# Patient Record
Sex: Female | Born: 1940 | Race: White | Hispanic: No | Marital: Single | Smoking: Never smoker
Health system: Southern US, Community
[De-identification: ages and names within clinical notes are randomized; demographics above are authoritative.]

## PROBLEM LIST (undated history)

## (undated) DIAGNOSIS — I441 Atrioventricular block, second degree: Secondary | ICD-10-CM

## (undated) DIAGNOSIS — L900 Lichen sclerosus et atrophicus: Secondary | ICD-10-CM

## (undated) DIAGNOSIS — I2510 Atherosclerotic heart disease of native coronary artery without angina pectoris: Secondary | ICD-10-CM

## (undated) DIAGNOSIS — G4730 Sleep apnea, unspecified: Secondary | ICD-10-CM

## (undated) DIAGNOSIS — M25571 Pain in right ankle and joints of right foot: Secondary | ICD-10-CM

## (undated) DIAGNOSIS — R519 Headache, unspecified: Secondary | ICD-10-CM

## (undated) DIAGNOSIS — Z950 Presence of cardiac pacemaker: Secondary | ICD-10-CM

## (undated) DIAGNOSIS — I5189 Other ill-defined heart diseases: Secondary | ICD-10-CM

## (undated) DIAGNOSIS — R0600 Dyspnea, unspecified: Secondary | ICD-10-CM

## (undated) DIAGNOSIS — M25541 Pain in joints of right hand: Secondary | ICD-10-CM

## (undated) DIAGNOSIS — E785 Hyperlipidemia, unspecified: Secondary | ICD-10-CM

## (undated) DIAGNOSIS — J189 Pneumonia, unspecified organism: Secondary | ICD-10-CM

## (undated) DIAGNOSIS — K219 Gastro-esophageal reflux disease without esophagitis: Secondary | ICD-10-CM

## (undated) DIAGNOSIS — I10 Essential (primary) hypertension: Secondary | ICD-10-CM

## (undated) DIAGNOSIS — L4050 Arthropathic psoriasis, unspecified: Secondary | ICD-10-CM

## (undated) DIAGNOSIS — L409 Psoriasis, unspecified: Secondary | ICD-10-CM

## (undated) DIAGNOSIS — R51 Headache: Secondary | ICD-10-CM

## (undated) DIAGNOSIS — G619 Inflammatory polyneuropathy, unspecified: Secondary | ICD-10-CM

## (undated) DIAGNOSIS — G610 Guillain-Barre syndrome: Secondary | ICD-10-CM

## (undated) DIAGNOSIS — R7303 Prediabetes: Secondary | ICD-10-CM

## (undated) DIAGNOSIS — L719 Rosacea, unspecified: Secondary | ICD-10-CM

## (undated) DIAGNOSIS — R0789 Other chest pain: Secondary | ICD-10-CM

## (undated) DIAGNOSIS — I4510 Unspecified right bundle-branch block: Secondary | ICD-10-CM

## (undated) DIAGNOSIS — S0300XA Dislocation of jaw, unspecified side, initial encounter: Secondary | ICD-10-CM

## (undated) DIAGNOSIS — L821 Other seborrheic keratosis: Secondary | ICD-10-CM

## (undated) DIAGNOSIS — M8580 Other specified disorders of bone density and structure, unspecified site: Secondary | ICD-10-CM

## (undated) DIAGNOSIS — I700 Atherosclerosis of aorta: Secondary | ICD-10-CM

## (undated) DIAGNOSIS — M1712 Unilateral primary osteoarthritis, left knee: Secondary | ICD-10-CM

## (undated) DIAGNOSIS — I5030 Unspecified diastolic (congestive) heart failure: Secondary | ICD-10-CM

## (undated) DIAGNOSIS — K8020 Calculus of gallbladder without cholecystitis without obstruction: Secondary | ICD-10-CM

## (undated) DIAGNOSIS — I4719 Other supraventricular tachycardia: Secondary | ICD-10-CM

## (undated) DIAGNOSIS — Z1211 Encounter for screening for malignant neoplasm of colon: Secondary | ICD-10-CM

## (undated) HISTORY — DX: Other chest pain: R07.89

## (undated) HISTORY — PX: INSERT / REPLACE / REMOVE PACEMAKER: SUR710

## (undated) HISTORY — DX: Lichen sclerosus et atrophicus: L90.0

## (undated) HISTORY — DX: Hyperlipidemia, unspecified: E78.5

## (undated) HISTORY — DX: Other specified disorders of bone density and structure, unspecified site: M85.80

## (undated) HISTORY — DX: Rosacea, unspecified: L71.9

## (undated) HISTORY — DX: Psoriasis, unspecified: L40.9

## (undated) HISTORY — DX: Inflammatory polyneuropathy, unspecified: G61.9

## (undated) HISTORY — DX: Other seborrheic keratosis: L82.1

## (undated) HISTORY — DX: Other ill-defined heart diseases: I51.89

## (undated) HISTORY — PX: APPENDECTOMY: SHX54

## (undated) HISTORY — PX: OTHER SURGICAL HISTORY: SHX169

## (undated) HISTORY — PX: BREAST CYST ASPIRATION: SHX578

## (undated) HISTORY — DX: Sleep apnea, unspecified: G47.30

## (undated) HISTORY — DX: Essential (primary) hypertension: I10

## (undated) HISTORY — PX: COLONOSCOPY: SHX174

## (undated) HISTORY — DX: Gastro-esophageal reflux disease without esophagitis: K21.9

---

## 1976-10-02 HISTORY — PX: BREAST CYST EXCISION: SHX579

## 1976-10-02 HISTORY — PX: BREAST BIOPSY: SHX20

## 2015-07-06 NOTE — Other (Signed)
[facility]  Endoscopy Preprocedure Instructions      1. On the day of your surgery, please report to registration located on the 2nd floor of the  the Main Hospital. yes    2. You must have a responsible adult to drive you to the hospital, stay at the hospital during your procedure and drive you home. If they leave your procedure will not be started (no exceptions). yes    3. Do not have anything to eat or drink (including water, gum, mints, coffee, and juice) after midnight. This does not apply to the medications you were instructed to take by your physician.yesIf you are currently taking Plavix, Coumadin, Aspirin, or other blood-thinning agents, contact your physician for special instructions. not applicable,    4. If you are having a procedure that requires bowel prep: We recommend that you have only clear liquids the day before your procedure and begin your bowel prep by 5:00 pm.  You may continue to drink clear liquids until midnight.  If for any reason you are not able to complete your prep please notify your physician immediately. yes    5. Have a list of all current medications, including vitamins, herbal supplements and any other over the counter medications. yes    6. If you wear glasses, contacts, dentures and/or hearing aids, they may be removed prior to procedure, please bring a case to store them in. yes    7. You should understand that if you do not follow these instructions your procedure may be cancelled.  If your physical condition changes (I.e. fever, cold or flu) please contact your doctor as soon as possible.    8. It is important that you be on time.  If for any reason you are unable to keep your appointment please call [phone] the day of or your physician???s office prior to your scheduled procedure

## 2015-07-06 NOTE — H&P (Signed)
Date: 03/08/2015 12:45 PM   Patient Name: Anna Dudley. Hounshell   Account #: [id number]    Gender: Female   DOB (age): [birth year] (74)       Provider:     Lamont Snowball. Dimitri Ped, MD        Referring Physician:     Stephanie Coup Neth  [address] [place], [place] [address]  [phone] (phone)  [phone] (fax)        Chief Complaint: Has an area near anus she wants checked           History of Present Illness:   Shamyia Grandpre is seen for an initial visit today.  Colon done in 2016 as normal. New lesion on perianal area. Derm said ok but have gi check. PCP said ok. Here to have checked. No rectal bleed or change in bowels.? ? ?Isadore? ?Mcewen? is seen for an initial visit today.  ? Colon done in 2016 as normal. New lesion on perianal area. Derm said ok but have gi check. PCP said ok. Here to have checked. No rectal bleed or change in bowels.?[ROS Wording]?       Past Medical History      Medical Conditions: High blood pressure  Sleep apnea   Surgical Procedures: Appendectomy, 1960   Dx Studies: Colonoscopy, 06/28/2005, normal   Medications: aspirin 81 mg  cholecalciferol (vitamin D3) 2,000 unit  Diovan 80 mg Take 1 by mouth once a day  pravastatin 40 mg Take 1 by mouth once a day   Allergies: Nickel  Sulfa (Sulfonamide Antibiotics)   Immunizations: TB Test, 2006  Flu vaccine, Fall 2015  Hep B  Pneumococcal conjugate PCV 13  zoster      Social History      Alcohol: Wine.   Tobacco: Never smoker   Drugs: None   Exercise: Cardio. Other: ___________. Walking/ Running.   Caffeine: Coffee or Tea # of cups per day:[name]   Marital Status:          Occupation:               Family History No history of Esophogeal Cancer, GI Cancers, IBD (Crohn's or UC), Liver disease  Father: Diagnosed with Family history of colon polyps;   Other: Diagnosed with Family history of colon cancer;       Review of Systems:   Cardiovascular: Presents suffers from peripheral edema. Denies chest pain, palpitations.    Constitutional: Presents suffers from fatigue. Denies fever, loss of appetite, weight loss.   ENMT: Presents suffers from hearing loss. Denies nose bleeds, sore throat.   Endocrine: Denies excessive thirst, heat intolerance.   Eyes: Denies loss of vision.   Gastrointestinal: Presents suffers from abdominal swelling, change in bowel habits, constipation, gas, heartburn, nausea, rectal bleeding. Denies abdominal pain, diarrhea, jaundice, stomach cramps, vomiting, dysphagia.   Genitourinary: Presents suffers from Menopause. Denies dark urine, hematuria, Hysterectomy.   Hematologic/Lymphatic: Presents suffers from easy bruising. Denies prolonged bleeding.   Integumentary: Presents suffers from itching, rashes. Denies jaundice, sun sensitivity.   Musculoskeletal: Presents suffers from joint pain. Denies back pain, muscle weakness.   Neurological: Denies dizziness, fainting, frequent headaches, memory loss.   Psychiatric: Denies anxiety, depression, Dementia, CVA.   Respiratory: Denies cough, dyspnea, wheezing.      Vital Signs:   BP  (mmHg)  Pulse  (ppm) Rhythm Weight (lbs/oz) Height (ft/in) BMI Temp   162/83 91 Regular 176 /  5 / 6 28.4 98.2 (F)  Physical Exam:   Constitutional:      Appearance: No distress, appears comfortable.   Communication: Understands/receives spoken information.   Skin:      Inspection: lichen sclerosis of gluteal fold.   Palpation: No subcutaneous nodules.   Gastrointestinal/Abdomen:      Abdomen: non-distended, nontender.   Liver/Spleen: normal, normal size, Liver size and consistency normal, spleen is non-palpable.   Hernias: no hernias appreciated.   Rectal: scar tissue at 11:00 oclock lithotomy; is not neoplastic and looks like old hemorrhoid scar or fissure scar, normal anal sphincter tone; no masses or tenderness, Hemoccult negative.   Rectal Exam: Normal.        Lab Results: No Lab Results      Impressions: Scar of skin from old hemorrhoid at 11:00 o'clock lithotomy   Screening for malignant neoplasms of colon? ?Scar of skin? from old hemorrhoid at 11:00 o'clock lithotomy?  ? ?Screening for malignant neoplasms of colon?         Assessment: no treatment for this needed  ? no treatment for this needed        Plan: Colonoscopy in Sept or Oct this year? ?Colonoscopy? in Sept or Oct this year        Risk & Medical Necessity: The patient requires Low to Moderate Severity care for this visit. Diagnosis and management options are Limited. The amount of data reviewed and/or ordered is Minimal/None. The level of risk is Minimal.           Notes:              Lamont Snowball. Dimitri Ped, MD     Electronically signed on 03/08/2015 1:08:14 PM by Lamont Snowball. Dimitri Ped, MD                Addenda         Joen Laura. Alberts, MRN [id number], DOB July 21, 1941 First Visit, Monday, March 08, 2015                                                                                                                                                        New     Modify          Delete     Delete all     Edit Wording          Sign     [email or web address]

## 2015-07-07 ENCOUNTER — Inpatient Hospital Stay: Payer: MEDICARE

## 2015-07-07 MED ORDER — FLUMAZENIL 0.1 MG/ML IV SOLN
0.1 mg/mL | INTRAVENOUS | Status: DC | PRN
Start: 2015-07-07 — End: 2015-07-07

## 2015-07-07 MED ORDER — PROPOFOL 10 MG/ML IV EMUL
10 mg/mL | INTRAVENOUS | Status: DC | PRN
Start: 2015-07-07 — End: 2015-07-07
  Administered 2015-07-07: 13:00:00 via INTRAVENOUS

## 2015-07-07 MED ORDER — SIMETHICONE 40 MG/0.6 ML ORAL DROPS, SUSP
40 mg/0.6 mL | ORAL | Status: DC | PRN
Start: 2015-07-07 — End: 2015-07-07

## 2015-07-07 MED ORDER — LIDOCAINE (PF) 20 MG/ML (2 %) IJ SOLN
20 mg/mL (2 %) | INTRAMUSCULAR | Status: DC | PRN
Start: 2015-07-07 — End: 2015-07-07
  Administered 2015-07-07: 13:00:00 via INTRAVENOUS

## 2015-07-07 MED ORDER — SODIUM CHLORIDE 0.9 % IV
INTRAVENOUS | Status: DC
Start: 2015-07-07 — End: 2015-07-07
  Administered 2015-07-07: 12:00:00 via INTRAVENOUS

## 2015-07-07 MED ORDER — FENTANYL CITRATE (PF) 50 MCG/ML IJ SOLN
50 mcg/mL | INTRAMUSCULAR | Status: DC | PRN
Start: 2015-07-07 — End: 2015-07-07

## 2015-07-07 MED ORDER — MIDAZOLAM 1 MG/ML IJ SOLN
1 mg/mL | INTRAMUSCULAR | Status: DC | PRN
Start: 2015-07-07 — End: 2015-07-07

## 2015-07-07 MED ORDER — NALOXONE 0.4 MG/ML INJECTION
0.4 mg/mL | INTRAMUSCULAR | Status: DC | PRN
Start: 2015-07-07 — End: 2015-07-07

## 2015-07-07 MED FILL — XYLOCAINE-MPF 20 MG/ML (2 %) INJECTION SOLUTION: 20 mg/mL (2 %) | INTRAMUSCULAR | Qty: 40

## 2015-07-07 MED FILL — DIPRIVAN 10 MG/ML INTRAVENOUS EMULSION: 10 mg/mL | INTRAVENOUS | Qty: 60

## 2015-07-07 MED FILL — SODIUM CHLORIDE 0.9 % IV: INTRAVENOUS | Qty: 1000

## 2015-07-07 MED FILL — DIPRIVAN 10 MG/ML INTRAVENOUS EMULSION: 10 mg/mL | INTRAVENOUS | Qty: 149.76

## 2015-07-07 NOTE — Procedures (Signed)
Procedures by Leta Speller, MD at 07/07/15 [id number]                Author: Leta Speller, MD  Service: Gastroenterology  Author Type: Physician       Filed: 07/07/15 0913  Date of Service: 07/07/15 0908  Status: Signed          Editor: Leta Speller, MD (Physician)            Procedures        1. COLONOSCOPY [[id number] (Custom)]                                                         [facility]   [place] [facility] D. Nunzio Cory, MD   [phone]        July 07, 2015      Colonoscopy Procedure Note   Ziyan Hillmer   DOB:  March 31, 1941   [facility] Medical Record Number: [id number]      Indications:     Screening colonoscopy   PCP:  Elie Goody, MD   Anesthesia/Sedation: Conscious Sedation/Moderate Sedation   Endoscopist:  Dr. Theodoro Parma   Complications:  None   Estimated Blood Loss:  None      Permit:   The indications, risks, benefits and alternatives were reviewed with the patient or their decision maker who was provided an opportunity to ask questions  and all questions were answered.  The specific risks of colonoscopy with conscious sedation were reviewed, including but not limited to anesthetic complication, bleeding, adverse drug reaction, missed lesion, infection, IV site reactions, and intestinal  perforation which would lead to the need for surgical repair.  Alternatives to colonoscopy including radiographic imaging, observation without testing, or laboratory testing were reviewed including the limitations of those alternatives.  After considering  the options and having all their questions answered, the patient or their decision maker provided both verbal and written consent to proceed.           Procedure in Detail:   After obtaining informed consent, positioning of the patient in the left lateral decubitus position, and conduction of a pre-procedure pause or "time out" the endoscope was introduced into the anus and  advanced to the cecum, which was identified by the  ileocecal valve and appendiceal orifice.  The quality of the colonic preparation was good.  A careful inspection was made as the colonoscope was withdrawn, findings and interventions are described below.      Findings:    There is what appears to be a scar at the vaginal fourchette.  There is no adenomatous or ulcerative change of the dermis in this location.  At the anal extent of this scar there is a shelf of tissue that is firmer than the skin lateral to.  Visually  this dermal finding is sclerosis or scarring.  Could be post surgical or could be lichen sclerosus.  What is clear is that this finding is dermal and not a gastrointestinal process.      The colon and anal mucosa are normal.   No polyps or neoplastic epithelium noted.      Specimens:     none      Complications:    None; patient tolerated the procedure well.  Impression:   Normal colonoscopy to the cecum, with no evidence of neoplasia, diverticular disease, or mucosal abnormality.      Recommendations:       - Given age 74 and lack of colonic findings today, further colonoscopy for screening purposes is not indicated.       - If the perianal findings today are bothersome to the patient or further diagnostic or therapeutic recommendations are desired I suggest consultation with either a dermatologist or gynecologist.            Thank you for entrusting me with this patient's care.  Please do not hesitate to contact me with any questions or if I can be of assistance with any of your other patients' GI needs.      Signed By: Leta Speller, MD                         July 07, 2015

## 2015-07-07 NOTE — Anesthesia Post-Procedure Evaluation (Signed)
Post-Anesthesia Evaluation and Assessment    Patient: Anna Dudley MRN: [id number]  SSN: [id number]    Date of Birth: July 15, 1941  Age: 74 y.o.  Sex: female       Cardiovascular Function/Vital Signs  Visit Vitals   ??? BP 132/67   ??? Pulse 75   ??? Temp 36.4 ??C (97.6 ??F)   ??? Resp 17   ??? Ht 5' 6.5" (1.689 m)   ??? Wt 78 kg (172 lb)   ??? SpO2 98%   ??? Breastfeeding No   ??? BMI 27.35 kg/m2       Patient is status post MAC anesthesia for Procedure(s):  COLONOSCOPY.    Nausea/Vomiting: None    Postoperative hydration reviewed and adequate.    Pain:  Pain Scale 1: Numeric (0 - 10) (07/07/15 5621)  Pain Intensity 1: 0 (07/07/15 3086)   Managed    Neurological Status:       At baseline    Mental Status and Level of Consciousness: Arousable    Pulmonary Status:   O2 Device: Room air (07/07/15 5784)   Adequate oxygenation and airway patent    Complications related to anesthesia: None    Post-anesthesia assessment completed. No concerns    Signed By: Satira Mccallum, MD     July 07, 2015

## 2015-07-07 NOTE — Anesthesia Pre-Procedure Evaluation (Signed)
Anesthetic History   No history of anesthetic complications            Review of Systems / Medical History  Patient summary reviewed and pertinent labs reviewed    Pulmonary        Sleep apnea           Neuro/Psych   Within defined limits           Cardiovascular    Hypertension              Exercise tolerance: >4 METS     GI/Hepatic/Renal     GERD: well controlled           Endo/Other        Arthritis     Other Findings              Physical Exam    Airway  Mallampati: II  TM Distance: 4 - 6 cm  Neck ROM: normal range of motion   Mouth opening: Normal     Cardiovascular  Regular rate and rhythm,  S1 and S2 normal,  no murmur, click, rub, or gallop  Rhythm: regular  Rate: normal         Dental    Dentition: Caps/crowns     Pulmonary  Breath sounds clear to auscultation               Abdominal  GI exam deferred       Other Findings            Anesthetic Plan    ASA: 2  Anesthesia type: MAC          Induction: Intravenous  Anesthetic plan and risks discussed with: Patient

## 2015-07-07 NOTE — Interval H&P Note (Signed)
Pre-Endoscopy H&P Update  Chief complaint/HPI/ROS:  The indication for the procedure, the patient's history and the patient's current medications are reviewed prior to the procedure and that data is reported on the H&P to which this document is attached.  Any significant complaints with regard to organ systems will be noted, and if not mentioned then a review of systems is not contributory.  Past Medical History   Diagnosis Date   ??? Arthritis    ??? GERD (gastroesophageal reflux disease)    ??? Hypertension    ??? Sleep apnea      Uses dental appliance      Past Surgical History   Procedure Laterality Date   ??? Hx appendectomy       Social   Social History   Substance Use Topics   ??? Smoking status: Former Smoker     Quit date: 07/06/1967   ??? Smokeless tobacco: Never Used   ??? Alcohol use 0.6 oz/week     1 Glasses of wine per week      Comment: once a month      History reviewed. No pertinent family history.   No Known Allergies   Prior to Admission Medications   Prescriptions Last Dose Informant Patient Reported? Taking?   aspirin 81 mg chewable tablet 07/06/2015 at pm  Yes Yes   Sig: Take 81 mg by mouth daily.   cholecalciferol, vitamin D3, (VITAMIN D3) 2,000 unit tab 07/06/2015 at pm  Yes Yes   Sig: Take  by mouth.   pravastatin (PRAVACHOL) 40 mg tablet 07/06/2015 at pm  Yes Yes   Sig: Take 40 mg by mouth nightly.   valsartan (DIOVAN) 80 mg tablet 07/07/2015 at 0600  Yes Yes   Sig: Take 80 mg by mouth daily. Indications: HYPERTENSION      Facility-Administered Medications: None       PHYSICAL EXAM:  The patient is examined immediately prior to the procedure.  Visit Vitals   ??? BP 137/61   ??? Pulse 67   ??? Temp 97.9 ??F (36.6 ??C)   ??? Resp 15   ??? Ht 5' 6.5" (1.689 m)   ??? Wt 78 kg (172 lb)   ??? SpO2 99%   ??? Breastfeeding No   ??? BMI 27.35 kg/m2     Gen: Appears comfortable, no distress.  Pulm: comfortable respirations with no abnormal audible breath sounds  CV: heart regular, well perfused  GI: abdomen flat.     PLAN:  Informed consent discussion held, patient afforded an opportunity to ask questions and all questions answered.  After being advised of the risks, benefits, and alternatives, the patient requested that we proceed and indicated so on a written consent form.      Will proceed with procedure as planned.  Leta Speller, MD

## 2015-07-07 NOTE — Procedures (Signed)
[facility]  [place] [facility] D. Nunzio Cory, MD  [phone]      July 07, 2015    Colonoscopy Procedure Note  Anna Dudley  DOB:  02-Jun-1941  [facility] Medical Record Number: [id number]    Indications:     Screening colonoscopy  PCP:  Elie Goody, MD  Anesthesia/Sedation: Conscious Sedation/Moderate Sedation  Endoscopist:  Dr. Theodoro Parma  Complications:  None  Estimated Blood Loss:  None    Permit:  The indications, risks, benefits and alternatives were reviewed with the patient or their decision maker who was provided an opportunity to ask questions and all questions were answered.  The specific risks of colonoscopy with conscious sedation were reviewed, including but not limited to anesthetic complication, bleeding, adverse drug reaction, missed lesion, infection, IV site reactions, and intestinal perforation which would lead to the need for surgical repair.  Alternatives to colonoscopy including radiographic imaging, observation without testing, or laboratory testing were reviewed including the limitations of those alternatives.  After considering the options and having all their questions answered, the patient or their decision maker provided both verbal and written consent to proceed.        Procedure in Detail:  After obtaining informed consent, positioning of the patient in the left lateral decubitus position, and conduction of a pre-procedure pause or "time out" the endoscope was introduced into the anus and advanced to the cecum, which was identified by the ileocecal valve and appendiceal orifice.  The quality of the colonic preparation was good.  A careful inspection was made as the colonoscope was withdrawn, findings and interventions are described below.    Findings:   There is what appears to be a scar at the vaginal fourchette.  There is no adenomatous or ulcerative change of the dermis in this location.  At the  anal extent of this scar there is a shelf of tissue that is firmer than the skin lateral to.  Visually this dermal finding is sclerosis or scarring.  Could be post surgical or could be lichen sclerosus.  What is clear is that this finding is dermal and not a gastrointestinal process.    The colon and anal mucosa are normal.  No polyps or neoplastic epithelium noted.    Specimens:    none    Complications:   None; patient tolerated the procedure well.    Impression:  Normal colonoscopy to the cecum, with no evidence of neoplasia, diverticular disease, or mucosal abnormality.    Recommendations:      - Given age 23 and lack of colonic findings today, further colonoscopy for screening purposes is not indicated.      - If the perianal findings today are bothersome to the patient or further diagnostic or therapeutic recommendations are desired I suggest consultation with either a dermatologist or gynecologist.        Thank you for entrusting me with this patient's care.  Please do not hesitate to contact me with any questions or if I can be of assistance with any of your other patients' GI needs.    Signed By: Leta Speller, MD                        July 07, 2015

## 2015-07-07 NOTE — Progress Notes (Signed)
See anesthesia flow-sheet for vital signs and procedural sedation. No respiratory distress. Abdomen without distention. No specimens obtained. Stable for transfer to recovery per anesthesia.

## 2015-10-25 DIAGNOSIS — I10 Essential (primary) hypertension: Secondary | ICD-10-CM | POA: Diagnosis not present

## 2015-10-25 DIAGNOSIS — E559 Vitamin D deficiency, unspecified: Secondary | ICD-10-CM | POA: Diagnosis not present

## 2015-10-25 DIAGNOSIS — M8580 Other specified disorders of bone density and structure, unspecified site: Secondary | ICD-10-CM | POA: Diagnosis not present

## 2015-10-25 DIAGNOSIS — E7800 Pure hypercholesterolemia, unspecified: Secondary | ICD-10-CM | POA: Diagnosis not present

## 2015-11-03 DIAGNOSIS — G4733 Obstructive sleep apnea (adult) (pediatric): Secondary | ICD-10-CM | POA: Diagnosis not present

## 2015-11-03 DIAGNOSIS — E663 Overweight: Secondary | ICD-10-CM | POA: Diagnosis not present

## 2015-11-03 DIAGNOSIS — L900 Lichen sclerosus et atrophicus: Secondary | ICD-10-CM | POA: Diagnosis not present

## 2015-11-03 DIAGNOSIS — I779 Disorder of arteries and arterioles, unspecified: Secondary | ICD-10-CM | POA: Diagnosis not present

## 2015-11-03 DIAGNOSIS — I10 Essential (primary) hypertension: Secondary | ICD-10-CM | POA: Diagnosis not present

## 2015-11-03 DIAGNOSIS — K219 Gastro-esophageal reflux disease without esophagitis: Secondary | ICD-10-CM | POA: Diagnosis not present

## 2015-11-03 DIAGNOSIS — M8580 Other specified disorders of bone density and structure, unspecified site: Secondary | ICD-10-CM | POA: Diagnosis not present

## 2015-11-03 DIAGNOSIS — E7800 Pure hypercholesterolemia, unspecified: Secondary | ICD-10-CM | POA: Diagnosis not present

## 2015-11-03 DIAGNOSIS — R928 Other abnormal and inconclusive findings on diagnostic imaging of breast: Secondary | ICD-10-CM | POA: Diagnosis not present

## 2015-11-08 DIAGNOSIS — R921 Mammographic calcification found on diagnostic imaging of breast: Secondary | ICD-10-CM | POA: Diagnosis not present

## 2016-01-21 DIAGNOSIS — G5762 Lesion of plantar nerve, left lower limb: Secondary | ICD-10-CM | POA: Diagnosis not present

## 2016-04-24 DIAGNOSIS — E7800 Pure hypercholesterolemia, unspecified: Secondary | ICD-10-CM | POA: Diagnosis not present

## 2016-04-24 DIAGNOSIS — I10 Essential (primary) hypertension: Secondary | ICD-10-CM | POA: Diagnosis not present

## 2016-05-03 DIAGNOSIS — M25541 Pain in joints of right hand: Secondary | ICD-10-CM | POA: Diagnosis not present

## 2016-05-03 DIAGNOSIS — M25542 Pain in joints of left hand: Secondary | ICD-10-CM | POA: Diagnosis not present

## 2016-05-03 DIAGNOSIS — I10 Essential (primary) hypertension: Secondary | ICD-10-CM | POA: Diagnosis not present

## 2016-05-03 DIAGNOSIS — M8580 Other specified disorders of bone density and structure, unspecified site: Secondary | ICD-10-CM | POA: Diagnosis not present

## 2016-05-03 DIAGNOSIS — Z1231 Encounter for screening mammogram for malignant neoplasm of breast: Secondary | ICD-10-CM | POA: Diagnosis not present

## 2016-05-03 DIAGNOSIS — K219 Gastro-esophageal reflux disease without esophagitis: Secondary | ICD-10-CM | POA: Diagnosis not present

## 2016-05-03 DIAGNOSIS — G4733 Obstructive sleep apnea (adult) (pediatric): Secondary | ICD-10-CM | POA: Diagnosis not present

## 2016-05-03 DIAGNOSIS — E663 Overweight: Secondary | ICD-10-CM | POA: Diagnosis not present

## 2016-05-03 DIAGNOSIS — I779 Disorder of arteries and arterioles, unspecified: Secondary | ICD-10-CM | POA: Diagnosis not present

## 2016-05-03 DIAGNOSIS — L900 Lichen sclerosus et atrophicus: Secondary | ICD-10-CM | POA: Diagnosis not present

## 2016-05-03 DIAGNOSIS — E559 Vitamin D deficiency, unspecified: Secondary | ICD-10-CM | POA: Diagnosis not present

## 2016-05-03 DIAGNOSIS — E7800 Pure hypercholesterolemia, unspecified: Secondary | ICD-10-CM | POA: Diagnosis not present

## 2016-05-08 DIAGNOSIS — M19041 Primary osteoarthritis, right hand: Secondary | ICD-10-CM | POA: Diagnosis not present

## 2016-05-08 DIAGNOSIS — M25542 Pain in joints of left hand: Secondary | ICD-10-CM | POA: Diagnosis not present

## 2016-05-08 DIAGNOSIS — M19042 Primary osteoarthritis, left hand: Secondary | ICD-10-CM | POA: Diagnosis not present

## 2016-05-08 DIAGNOSIS — M25541 Pain in joints of right hand: Secondary | ICD-10-CM | POA: Diagnosis not present

## 2016-05-16 DIAGNOSIS — M1811 Unilateral primary osteoarthritis of first carpometacarpal joint, right hand: Secondary | ICD-10-CM | POA: Diagnosis not present

## 2016-05-18 DIAGNOSIS — M1811 Unilateral primary osteoarthritis of first carpometacarpal joint, right hand: Secondary | ICD-10-CM | POA: Diagnosis not present

## 2016-05-19 DIAGNOSIS — Z1231 Encounter for screening mammogram for malignant neoplasm of breast: Secondary | ICD-10-CM | POA: Diagnosis not present

## 2016-05-19 DIAGNOSIS — Z13820 Encounter for screening for osteoporosis: Secondary | ICD-10-CM | POA: Diagnosis not present

## 2016-05-23 DIAGNOSIS — M1811 Unilateral primary osteoarthritis of first carpometacarpal joint, right hand: Secondary | ICD-10-CM | POA: Diagnosis not present

## 2016-05-25 DIAGNOSIS — M1811 Unilateral primary osteoarthritis of first carpometacarpal joint, right hand: Secondary | ICD-10-CM | POA: Diagnosis not present

## 2016-05-30 DIAGNOSIS — M1811 Unilateral primary osteoarthritis of first carpometacarpal joint, right hand: Secondary | ICD-10-CM | POA: Diagnosis not present

## 2016-06-01 DIAGNOSIS — M1811 Unilateral primary osteoarthritis of first carpometacarpal joint, right hand: Secondary | ICD-10-CM | POA: Diagnosis not present

## 2016-07-10 DIAGNOSIS — Z23 Encounter for immunization: Secondary | ICD-10-CM | POA: Diagnosis not present

## 2016-07-12 DIAGNOSIS — H2513 Age-related nuclear cataract, bilateral: Secondary | ICD-10-CM | POA: Diagnosis not present

## 2016-07-12 DIAGNOSIS — H52223 Regular astigmatism, bilateral: Secondary | ICD-10-CM | POA: Diagnosis not present

## 2016-07-12 DIAGNOSIS — H524 Presbyopia: Secondary | ICD-10-CM | POA: Diagnosis not present

## 2016-07-12 DIAGNOSIS — H5213 Myopia, bilateral: Secondary | ICD-10-CM | POA: Diagnosis not present

## 2016-07-26 DIAGNOSIS — D1801 Hemangioma of skin and subcutaneous tissue: Secondary | ICD-10-CM | POA: Diagnosis not present

## 2016-07-26 DIAGNOSIS — D485 Neoplasm of uncertain behavior of skin: Secondary | ICD-10-CM | POA: Diagnosis not present

## 2016-07-26 DIAGNOSIS — L408 Other psoriasis: Secondary | ICD-10-CM | POA: Diagnosis not present

## 2016-07-26 DIAGNOSIS — L821 Other seborrheic keratosis: Secondary | ICD-10-CM | POA: Diagnosis not present

## 2016-07-26 DIAGNOSIS — L820 Inflamed seborrheic keratosis: Secondary | ICD-10-CM | POA: Diagnosis not present

## 2016-07-26 DIAGNOSIS — N904 Leukoplakia of vulva: Secondary | ICD-10-CM | POA: Diagnosis not present

## 2016-08-03 DIAGNOSIS — D485 Neoplasm of uncertain behavior of skin: Secondary | ICD-10-CM | POA: Diagnosis not present

## 2016-09-20 DIAGNOSIS — G4733 Obstructive sleep apnea (adult) (pediatric): Secondary | ICD-10-CM | POA: Diagnosis not present

## 2016-10-03 DIAGNOSIS — Z111 Encounter for screening for respiratory tuberculosis: Secondary | ICD-10-CM | POA: Diagnosis not present

## 2016-10-11 DIAGNOSIS — E559 Vitamin D deficiency, unspecified: Secondary | ICD-10-CM | POA: Diagnosis not present

## 2016-10-11 DIAGNOSIS — I10 Essential (primary) hypertension: Secondary | ICD-10-CM | POA: Diagnosis not present

## 2016-10-11 DIAGNOSIS — E7800 Pure hypercholesterolemia, unspecified: Secondary | ICD-10-CM | POA: Diagnosis not present

## 2016-10-26 DIAGNOSIS — G4733 Obstructive sleep apnea (adult) (pediatric): Secondary | ICD-10-CM | POA: Diagnosis not present

## 2016-10-26 DIAGNOSIS — K219 Gastro-esophageal reflux disease without esophagitis: Secondary | ICD-10-CM | POA: Diagnosis not present

## 2016-10-26 DIAGNOSIS — E7800 Pure hypercholesterolemia, unspecified: Secondary | ICD-10-CM | POA: Diagnosis not present

## 2016-10-26 DIAGNOSIS — E663 Overweight: Secondary | ICD-10-CM | POA: Diagnosis not present

## 2016-10-26 DIAGNOSIS — E559 Vitamin D deficiency, unspecified: Secondary | ICD-10-CM | POA: Diagnosis not present

## 2016-10-26 DIAGNOSIS — Z6828 Body mass index (BMI) 28.0-28.9, adult: Secondary | ICD-10-CM | POA: Diagnosis not present

## 2016-10-26 DIAGNOSIS — M8580 Other specified disorders of bone density and structure, unspecified site: Secondary | ICD-10-CM | POA: Diagnosis not present

## 2016-10-26 DIAGNOSIS — L900 Lichen sclerosus et atrophicus: Secondary | ICD-10-CM | POA: Diagnosis not present

## 2016-10-26 DIAGNOSIS — I10 Essential (primary) hypertension: Secondary | ICD-10-CM | POA: Diagnosis not present

## 2016-10-26 DIAGNOSIS — I779 Disorder of arteries and arterioles, unspecified: Secondary | ICD-10-CM | POA: Diagnosis not present

## 2017-03-02 ENCOUNTER — Ambulatory Visit (INDEPENDENT_AMBULATORY_CARE_PROVIDER_SITE_OTHER): Payer: Medicare Other | Admitting: Family Medicine

## 2017-03-02 ENCOUNTER — Encounter: Payer: Self-pay | Admitting: Family Medicine

## 2017-03-02 VITALS — BP 138/80 | HR 64 | Temp 98.1°F | Ht 66.5 in | Wt 173.6 lb

## 2017-03-02 DIAGNOSIS — G4733 Obstructive sleep apnea (adult) (pediatric): Secondary | ICD-10-CM | POA: Diagnosis not present

## 2017-03-02 DIAGNOSIS — E785 Hyperlipidemia, unspecified: Secondary | ICD-10-CM | POA: Diagnosis not present

## 2017-03-02 DIAGNOSIS — Z1231 Encounter for screening mammogram for malignant neoplasm of breast: Secondary | ICD-10-CM

## 2017-03-02 DIAGNOSIS — M8580 Other specified disorders of bone density and structure, unspecified site: Secondary | ICD-10-CM

## 2017-03-02 DIAGNOSIS — Z0100 Encounter for examination of eyes and vision without abnormal findings: Secondary | ICD-10-CM | POA: Diagnosis not present

## 2017-03-02 DIAGNOSIS — Z1239 Encounter for other screening for malignant neoplasm of breast: Secondary | ICD-10-CM

## 2017-03-02 DIAGNOSIS — I10 Essential (primary) hypertension: Secondary | ICD-10-CM | POA: Diagnosis not present

## 2017-03-02 DIAGNOSIS — M810 Age-related osteoporosis without current pathological fracture: Secondary | ICD-10-CM | POA: Insufficient documentation

## 2017-03-02 NOTE — Assessment & Plan Note (Signed)
Refer to optometry for yearly eye exam.

## 2017-03-02 NOTE — Progress Notes (Signed)
Tommi Rumps, MD Phone: [phone]  Alison Huffman is a 76 y.o. female who presents today for new patient visit  Hypertension: Typically in the 120s-130/70-80s. Taking Diovan.   Hyperlipidemia: taking pravastatin. Tolerating this.  OSA: Mild. Uses a mouth apparatus. Was seeing pulmonology previously. Not as much snoring now. Does wake up rested. Occasionally takes naps in the afternoon if she is not busy.  Osteopenia: Currently on alendronate. Initially did have a little bit of an issue with her reflux though this is resolved. No issues taking this medicine currently. Currently taking vitamin D. Has trouble with calcium as it makes her constipated.  She needs to get set up with a local optometrist as well. Does wear contact in one eye.  Active Ambulatory Problems    Diagnosis Date Noted  . Hypertension 03/02/2017  . Hyperlipidemia 03/02/2017  . OSA (obstructive sleep apnea) 03/02/2017  . Eye exam, routine 03/02/2017  . Osteopenia 03/02/2017   Resolved Ambulatory Problems    Diagnosis Date Noted  . No Resolved Ambulatory Problems   Past Medical History:  Diagnosis Date  . Hyperlipidemia   . Hypertension     Family History  Problem Relation Age of Onset  . Stroke Mother   . Atrial fibrillation Mother   . Breast cancer Mother   . Stroke Father   . Breast cancer Maternal Aunt   . Breast cancer Paternal Aunt     Social History   Social History  . Marital status: Single    Spouse name: N/A  . Number of children: N/A  . Years of education: N/A   Occupational History  . Not on file.   Social History Main Topics  . Smoking status: Never Smoker  . Smokeless tobacco: Never Used  . Alcohol use Yes  . Drug use: No  . Sexual activity: Not on file   Other Topics Concern  . Not on file   Social History Narrative  . No narrative on file    ROS  General:  Negative for nexplained weight loss, fever Skin: Negative for new or changing mole, sore that won't  heal HEENT: Negative for trouble hearing, trouble seeing, ringing in ears, mouth sores, hoarseness, change in voice, dysphagia. CV:  Negative for chest pain, dyspnea, edema, palpitations Resp: Negative for cough, dyspnea, hemoptysis GI: Negative for nausea, vomiting, diarrhea, constipation, abdominal pain, melena, hematochezia. GU: Negative for dysuria, incontinence, urinary hesitance, hematuria, vaginal or penile discharge, polyuria, sexual difficulty, lumps in testicle or breasts MSK: Negative for muscle cramps or aches, joint pain or swelling Neuro: Negative for headaches, weakness, numbness, dizziness, passing out/fainting Psych: Negative for depression, anxiety, memory problems  Objective  Physical Exam Vitals:   03/02/17 0808  BP: 138/80  Pulse: 64  Temp: 98.1 F (36.7 C)    BP Readings from Last 3 Encounters:  03/02/17 138/80   Wt Readings from Last 3 Encounters:  03/02/17 173 lb 9.6 oz (78.7 kg)    Physical Exam  Constitutional: No distress.  HENT:  Head: Normocephalic and atraumatic.  Mouth/Throat: Oropharynx is clear and moist. No oropharyngeal exudate.  Eyes: Conjunctivae are normal. Pupils are equal, round, and reactive to light.  Cardiovascular: Normal rate, regular rhythm and normal heart sounds.   Pulmonary/Chest: Effort normal and breath sounds normal.  Abdominal: Soft. Bowel sounds are normal. She exhibits no distension. There is no tenderness. There is no rebound and no guarding.  Musculoskeletal: She exhibits no edema.  Neurological: She is alert. Gait normal.  Skin: Skin is warm  and dry. She is not diaphoretic.  Psychiatric: Mood and affect normal.     Assessment/Plan:   Hypertension Well-controlled. We'll request records. Return in 3 months for lab work.  Hyperlipidemia Tolerating pravastatin. Continue this.  Eye exam, routine Refer to optometry for yearly eye exam.  OSA (obstructive sleep apnea) Continue mouth apparatus. Refer to  pulmonology.  Osteopenia Recently started on alendronate. She'll continue this. We'll request records.   Orders Placed This Encounter  Procedures  . MM Digital Screening    Schedule after 05/26/17, previously done at [facility] location in [place]    Standing Status:   Future    Standing Expiration Date:   05/02/2018    Order Specific Question:   Reason for Exam (SYMPTOM  OR DIAGNOSIS REQUIRED)    Answer:   screening mammogram    Order Specific Question:   Preferred imaging location?    Answer:   [facility]  . Ambulatory referral to Optometry    Referral Priority:   Routine    Referral Type:   Vision Transport planner)    Referral Reason:   Specialty Services Required    Requested Specialty:   Optometry    Number of Visits Requested:   1  . Ambulatory referral to Pulmonology    Referral Priority:   Routine    Referral Type:   Consultation    Referral Reason:   Specialty Services Required    Requested Specialty:   Pulmonary Disease    Number of Visits Requested:   [place], MD [place]

## 2017-03-02 NOTE — Assessment & Plan Note (Signed)
Tolerating pravastatin. Continue this.

## 2017-03-02 NOTE — Assessment & Plan Note (Signed)
Well-controlled. We'll request records. Return in 3 months for lab work.

## 2017-03-02 NOTE — Patient Instructions (Signed)
Nice to meet you. Please continue to monitor your blood pressure. We will get you to see pulmonology and optometry. We'll get you set up for mammogram. We will see you back in 3 months.

## 2017-03-02 NOTE — Assessment & Plan Note (Signed)
Recently started on alendronate. She'll continue this. We'll request records.

## 2017-03-02 NOTE — Assessment & Plan Note (Signed)
Continue mouth apparatus. Refer to pulmonology.

## 2017-03-07 ENCOUNTER — Encounter: Payer: Self-pay | Admitting: Family Medicine

## 2017-03-24 ENCOUNTER — Encounter: Payer: Self-pay | Admitting: Family Medicine

## 2017-03-24 DIAGNOSIS — K219 Gastro-esophageal reflux disease without esophagitis: Secondary | ICD-10-CM | POA: Insufficient documentation

## 2017-03-24 DIAGNOSIS — G4730 Sleep apnea, unspecified: Secondary | ICD-10-CM | POA: Insufficient documentation

## 2017-03-24 DIAGNOSIS — L900 Lichen sclerosus et atrophicus: Secondary | ICD-10-CM | POA: Insufficient documentation

## 2017-03-26 ENCOUNTER — Institutional Professional Consult (permissible substitution): Payer: Medicare Other | Admitting: Internal Medicine

## 2017-03-26 DIAGNOSIS — J069 Acute upper respiratory infection, unspecified: Secondary | ICD-10-CM | POA: Diagnosis not present

## 2017-03-30 ENCOUNTER — Encounter: Payer: Self-pay | Admitting: Family Medicine

## 2017-04-02 ENCOUNTER — Ambulatory Visit
Admission: RE | Admit: 2017-04-02 | Discharge: 2017-04-02 | Disposition: A | Discharge status: Home / Self Care | Payer: Medicare Other | Source: Ambulatory Visit | Attending: Internal Medicine | Admitting: Internal Medicine

## 2017-04-02 ENCOUNTER — Encounter: Payer: Self-pay | Admitting: Internal Medicine

## 2017-04-02 ENCOUNTER — Ambulatory Visit (INDEPENDENT_AMBULATORY_CARE_PROVIDER_SITE_OTHER): Payer: Medicare Other | Admitting: Internal Medicine

## 2017-04-02 VITALS — BP 126/78 | HR 65 | Ht 66.5 in | Wt 178.0 lb

## 2017-04-02 DIAGNOSIS — R0602 Shortness of breath: Secondary | ICD-10-CM

## 2017-04-02 DIAGNOSIS — R918 Other nonspecific abnormal finding of lung field: Secondary | ICD-10-CM | POA: Diagnosis not present

## 2017-04-02 NOTE — Patient Instructions (Signed)
If you sleepiness gets worse then call us as you may need to have another sleep study with the dental device in place.     Sleep Apnea Sleep apnea is disorder that affects a person's sleep. A person with sleep apnea has abnormal pauses in their breathing when they sleep. It is hard for them to get a good sleep. This makes a person tired during the day. It also can lead to other physical problems. There are three types of sleep apnea. One type is when breathing stops for a short time because your airway is blocked (obstructive sleep apnea). Another type is when the brain sometimes fails to give the normal signal to breathe to the muscles that control your breathing (central sleep apnea). The third type is a combination of the other two types. HOME CARE   Take all medicine as told by your doctor.  Avoid alcohol, calming medicines (sedatives), and depressant drugs.  Try to lose weight if you are overweight. Talk to your doctor about a healthy weight goal.  Your doctor may have you use a device that helps to open your airway. It can help you get the air that you need. It is called a positive airway pressure (PAP) device.   MAKE SURE YOU:   Understand these instructions.  Will watch your condition.  Will get help right away if you are not doing well or get worse.  It may take approximately 1 month for you to get used to wearing her CPAP every night.  Be sure to work with your machine to get used to it, be patient, it may take time!

## 2017-04-02 NOTE — Progress Notes (Signed)
[facility] Consultation      Assessment and Plan:  Obstructive sleep apnea. -Known obstructive sleep apnea, diagnosed 2015. Currently being treated with a dental device, and appears to be doing well with it. Denies symptoms of significant daytime sleepiness. -Patient is instructed that if she starts to become sleepy during the day, we may need to recheck a sleep study while she is wearing her dental device to make sure she is adequately treated. She can otherwise follow-up in one year's time.  Dyspnea. -She notes occasional dyspnea, and keeping up with people of her own age. -This appears to be very mild, will check a chest x-ray, more for reasons of obtaining a baseline image as she does not have one in our system.  Date: 04/02/2017  MRN# [id number] CHARIAH BAILEY July 23, 1941  Referring Physician:   TELETHA PETREA is a 76 y.o. old female seen in consultation for chief complaint of:    Chief Complaint  Patient presents with  . Advice Only    Referred Dr.Eric Sonnerberg  . Sleeping Problem    HX sleep apnea: uses mouth piece (29yrs): mild daytime sleepiness:     HPI:   The patient is a 76 year old female (though looks considerably younger) with a history of affective sleep apnea, currently using a dental appliance. She usually goes to bed at 10 PM, she falls asleep within 5-10 minutes. She wakes up about twice per night. She usually gets out of bed at 6:30 AM.  She lived in [place], but recently moved down here. She was diagnosed with OSA in around 2015 when it was found that she had an abnormality of the optic nerve. She is using a dental device every night, since 2015 and feels comfortable with it. She is feeling well, she is not sleepy during the day, but sometimes after lunch she will take a nap. She wakes in the am feeling rested.   She does notice that she gets winded faster than people her own age. She has never been a smoker.     PMHX:   Past Medical  History:  Diagnosis Date  . GERD (gastroesophageal reflux disease)   . Hyperlipidemia   . Hypertension   . 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 Lichen sclerosus   . Osteopenia   . Psoriasis   . Rosacea   . Seborrheic keratosis   . Sleep apnea    Surgical Hx:  Past Surgical History:  Procedure Laterality Date  . APPENDECTOMY     Family Hx:  Family History  Problem Relation Age of Onset  . Stroke Mother   . Atrial fibrillation Mother   . Breast cancer Mother   . Stroke Father   . Breast cancer Maternal Aunt   . Breast cancer Paternal Aunt    Social Hx:   Social History  Substance Use Topics  . Smoking status: Never Smoker  . Smokeless tobacco: Never Used  . Alcohol use Yes   Medication:    Current Outpatient Prescriptions:  .  alendronate (FOSAMAX) 35 MG tablet, Take 35 mg by mouth every 7 (seven) days. Take with a full glass of water on an empty stomach., Disp: , Rfl:  .  aspirin EC 81 MG tablet, Take 81 mg by mouth daily., Disp: , Rfl:  .  Cholecalciferol (VITAMIN D) 2000 units CAPS, Take by mouth., Disp: , Rfl:  .  Clobetasol Prop Emollient Base (CLOBETASOL PROPIONATE E) 0.05 % emollient cream, Apply topically 2 (two) times daily., Disp: ,  Rfl:  .  desonide (DESOWEN) 0.05 % ointment, Apply 1 application topically 2 (two) times daily., Disp: , Rfl:  .  metroNIDAZOLE (METROCREAM) 0.75 % cream, Apply topically 2 (two) times daily., Disp: , Rfl:  .  nystatin cream (MYCOSTATIN), Apply 1 application topically 2 (two) times daily., Disp: , Rfl:  .  pravastatin (PRAVACHOL) 40 MG tablet, Take 40 mg by mouth daily., Disp: , Rfl:  .  ranitidine (ZANTAC) 150 MG tablet, Take 150 mg by mouth 2 (two) times daily., Disp: , Rfl:  .  tacrolimus (PROTOPIC) 0.1 % ointment, Apply topically 2 (two) times daily., Disp: , Rfl:  .  valsartan (DIOVAN) 80 MG tablet, Take 80 mg by mouth daily., Disp: , Rfl:    Allergies:  Patient has no known allergies.  Review of Systems: Gen:  Denies   fever, sweats, chills HEENT: Denies blurred vision, double vision. bleeds, sore throat Cvc:  No dizziness, chest pain. Resp:   Denies cough or sputum production, shortness of breath Gi: Denies swallowing difficulty, stomach pain. Gu:  Denies bladder incontinence, burning urine Ext:   No Joint pain, stiffness. Skin: No skin rash,  hives  Endoc:  No polyuria, polydipsia. Psych: No depression, insomnia. Other:  All other systems were reviewed with the patient and were negative other that what is mentioned in the HPI.   Physical Examination:   VS: BP 126/78 (BP Location: Left Arm, Cuff Size: Normal)   Pulse 65   Ht 5' 6.5" (1.689 m)   Wt 178 lb (80.7 kg)   SpO2 93%   BMI 28.30 kg/m   General Appearance: No distress  Neuro:without focal findings,  speech normal,  HEENT: PERRLA, EOM intact.  [place] 3.  Pulmonary: normal breath sounds, No wheezing.  CardiovascularNormal S1,S2.  No m/r/g.   Abdomen: Benign, Soft, non-tender. Renal:  No costovertebral tenderness  GU:  No performed at this time. Endoc: No evident thyromegaly, no signs of acromegaly. Skin:   warm, no rashes, no ecchymosis  Extremities: normal, no cyanosis, clubbing.  Other findings:    LABORATORY PANEL:   CBC No results for input(s): WBC, HGB, HCT, PLT in the last 168 hours. ------------------------------------------------------------------------------------------------------------------  Chemistries  No results for input(s): NA, K, CL, CO2, GLUCOSE, BUN, CREATININE, CALCIUM, MG, AST, ALT, ALKPHOS, BILITOT in the last 168 hours.  Invalid input(s): GFRCGP ------------------------------------------------------------------------------------------------------------------  Cardiac Enzymes No results for input(s): TROPONINI in the last 168 hours. ------------------------------------------------------------  RADIOLOGY:  No results found.     Thank  you for the consultation and for allowing [facility] to assist in the care of your patient. Our recommendations are noted above.  Please contact us if we can be of further service.   Marda Stalker, MD.  Board Certified in Internal Medicine, Pulmonary Medicine, [place], and Sleep Medicine.  [facility] Office Number: [phone]  Mellany Pesa, M.D.  Merton Border, M.D  04/02/2017

## 2017-04-26 DIAGNOSIS — D485 Neoplasm of uncertain behavior of skin: Secondary | ICD-10-CM | POA: Diagnosis not present

## 2017-04-26 DIAGNOSIS — D239 Other benign neoplasm of skin, unspecified: Secondary | ICD-10-CM | POA: Diagnosis not present

## 2017-04-26 DIAGNOSIS — L400 Psoriasis vulgaris: Secondary | ICD-10-CM | POA: Diagnosis not present

## 2017-04-26 DIAGNOSIS — L718 Other rosacea: Secondary | ICD-10-CM | POA: Diagnosis not present

## 2017-04-26 DIAGNOSIS — D1801 Hemangioma of skin and subcutaneous tissue: Secondary | ICD-10-CM | POA: Diagnosis not present

## 2017-04-26 DIAGNOSIS — L900 Lichen sclerosus et atrophicus: Secondary | ICD-10-CM | POA: Diagnosis not present

## 2017-05-28 ENCOUNTER — Ambulatory Visit: Payer: Medicare Other

## 2017-05-29 ENCOUNTER — Ambulatory Visit
Admission: RE | Admit: 2017-05-29 | Discharge: 2017-05-29 | Disposition: A | Discharge status: Home / Self Care | Payer: Medicare Other | Source: Ambulatory Visit | Attending: Family Medicine | Admitting: Family Medicine

## 2017-05-29 DIAGNOSIS — Z1239 Encounter for other screening for malignant neoplasm of breast: Secondary | ICD-10-CM

## 2017-05-29 DIAGNOSIS — Z1231 Encounter for screening mammogram for malignant neoplasm of breast: Secondary | ICD-10-CM | POA: Insufficient documentation

## 2017-06-05 ENCOUNTER — Other Ambulatory Visit: Payer: Self-pay | Admitting: *Deleted

## 2017-06-05 ENCOUNTER — Inpatient Hospital Stay
Admission: RE | Admit: 2017-06-05 | Discharge: 2017-06-05 | Disposition: A | Discharge status: Home / Self Care | Payer: Self-pay | Source: Ambulatory Visit | Attending: *Deleted | Admitting: *Deleted

## 2017-06-05 ENCOUNTER — Ambulatory Visit: Payer: Self-pay | Admitting: Family Medicine

## 2017-06-05 DIAGNOSIS — Z9289 Personal history of other medical treatment: Secondary | ICD-10-CM

## 2017-06-05 DIAGNOSIS — H47093 Other disorders of optic nerve, not elsewhere classified, bilateral: Secondary | ICD-10-CM | POA: Diagnosis not present

## 2017-06-14 ENCOUNTER — Ambulatory Visit (INDEPENDENT_AMBULATORY_CARE_PROVIDER_SITE_OTHER): Payer: Medicare Other | Admitting: Family Medicine

## 2017-06-14 ENCOUNTER — Ambulatory Visit (INDEPENDENT_AMBULATORY_CARE_PROVIDER_SITE_OTHER): Payer: Medicare Other

## 2017-06-14 ENCOUNTER — Encounter: Payer: Self-pay | Admitting: Family Medicine

## 2017-06-14 ENCOUNTER — Other Ambulatory Visit: Payer: Self-pay | Admitting: Family Medicine

## 2017-06-14 VITALS — BP 128/82 | HR 71 | Temp 97.7°F | Wt 178.6 lb

## 2017-06-14 DIAGNOSIS — Z8679 Personal history of other diseases of the circulatory system: Secondary | ICD-10-CM | POA: Diagnosis not present

## 2017-06-14 DIAGNOSIS — M7989 Other specified soft tissue disorders: Secondary | ICD-10-CM | POA: Diagnosis not present

## 2017-06-14 DIAGNOSIS — J309 Allergic rhinitis, unspecified: Secondary | ICD-10-CM

## 2017-06-14 DIAGNOSIS — M25571 Pain in right ankle and joints of right foot: Secondary | ICD-10-CM

## 2017-06-14 DIAGNOSIS — L4050 Arthropathic psoriasis, unspecified: Secondary | ICD-10-CM | POA: Insufficient documentation

## 2017-06-14 DIAGNOSIS — M19041 Primary osteoarthritis, right hand: Secondary | ICD-10-CM | POA: Diagnosis not present

## 2017-06-14 DIAGNOSIS — I10 Essential (primary) hypertension: Secondary | ICD-10-CM | POA: Diagnosis not present

## 2017-06-14 DIAGNOSIS — E785 Hyperlipidemia, unspecified: Secondary | ICD-10-CM

## 2017-06-14 LAB — COMPREHENSIVE METABOLIC PANEL
ALT: 16 U/L (ref 0–35)
AST: 16 U/L (ref 0–37)
Albumin: 4.2 g/dL (ref 3.5–5.2)
Alkaline Phosphatase: 87 U/L (ref 39–117)
BUN: 12 mg/dL (ref 6–23)
CO2: 27 mEq/L (ref 19–32)
Calcium: 9.8 mg/dL (ref 8.4–10.5)
Chloride: 103 mEq/L (ref 96–112)
Creatinine, Ser: 0.65 mg/dL (ref 0.40–1.20)
GFR: 93.79 mL/min (ref >60.00)
Glucose, Bld: 86 mg/dL (ref 70–99)
Potassium: 4.3 mEq/L (ref 3.5–5.1)
Sodium: 138 mEq/L (ref 135–145)
Total Bilirubin: 0.6 mg/dL (ref 0.2–1.2)
Total Protein: 7.3 g/dL (ref 6.0–8.3)

## 2017-06-14 LAB — LIPID PANEL
Cholesterol: 190 mg/dL (ref 0–200)
HDL: 66.9 mg/dL (ref >39.00)
LDL Cholesterol: 108 mg/dL — ABNORMAL HIGH (ref 0–99)
NonHDL: 123.57
Total CHOL/HDL Ratio: 3
Triglycerides: 77 mg/dL (ref 0.0–149.0)
VLDL: 15.4 mg/dL (ref 0.0–40.0)

## 2017-06-14 MED ORDER — ROSUVASTATIN CALCIUM 20 MG PO TABS: 20.0000 mg | ORAL_TABLET | Freq: Every day | ORAL | 3 refills | Status: DC

## 2017-06-14 MED ORDER — VALSARTAN 80 MG PO TABS: 80.0000 mg | ORAL_TABLET | Freq: Every day | ORAL | 1 refills | Status: DC

## 2017-06-14 MED ORDER — ALENDRONATE SODIUM 35 MG PO TABS: 35.0000 mg | ORAL_TABLET | ORAL | 0 refills | Status: DC

## 2017-06-14 MED ORDER — MELOXICAM 7.5 MG PO TABS: 7.5000 mg | ORAL_TABLET | Freq: Every day | ORAL | 0 refills | Status: DC

## 2017-06-14 MED ORDER — PRAVASTATIN SODIUM 40 MG PO TABS: 40.0000 mg | ORAL_TABLET | Freq: Every day | ORAL | 1 refills | Status: DC

## 2017-06-14 NOTE — Assessment & Plan Note (Addendum)
Well-controlled. Check lab work today. Patient with very minimal exercise intolerance that has been going on for many years and has been unchanged. Suspect related to deconditioning. Chest x-ray and pulmonology evaluation previously. Discussed possible cardiac cause and offered referral to cardiology though she deferred. If this changes or worsens she'll be reevaluated and consider cardiology referral.

## 2017-06-14 NOTE — Assessment & Plan Note (Signed)
Suspect discomfort in right little finger is related to osteoarthritis particularly given x-ray previously showing arthritis in her hand. Once her kidney function returns we will consider prescription strength anti-inflammatory for short course. If not improving consider repeat x-ray.

## 2017-06-14 NOTE — Addendum Note (Signed)
Addended by: Juanda Chance on: 06/14/2017 10:20 AM   Modules accepted: Orders

## 2017-06-14 NOTE — Assessment & Plan Note (Signed)
Carotid ultrasound ordered to follow-up on this.

## 2017-06-14 NOTE — Addendum Note (Signed)
Addended by: Juanda Chance on: 06/14/2017 10:14 AM   Modules accepted: Orders

## 2017-06-14 NOTE — Progress Notes (Signed)
Tommi Rumps, MD Phone: [phone]  Alison Huffman is a 76 y.o. female who presents today for follow-up.  Hypertension: Typically in the 120s over 70s at home. Taking valsartan. No chest pain. Notes intermittent ankle edema is been going on for some time though no orthopnea. Does note chronically feeling as though she can't quite keep up with the people her age. This has been going on for years and years. Previously discussed with her prior physician. She notes if she goes hiking she has slightly more difficulty than her peers though nothing excessive. She had a chest x-ray through pulmonology for this.  Patient notes postnasal drip since moving to [place]. Occasional itchy watery eyes. No rhinorrhea. Rare sneezing. No medications for this.  She reports a history of carotid artery stenosis found several years ago. She had an ultrasound in 2016 and they recommended follow-up in 1-2 years. She has had no issues with this.  Patient reports for the last 3 weeks her right pinky has been inflamed. Mostly at the PIP joint. She had a similar episode in her right thumb about a year ago and she had an x-ray to her prior physician's office that revealed osteoarthritis per her report. They treated her with an anti-inflammatory and this improved. Some days it is better than others. She tried over-the-counter Aleve with little benefit. No injury.  She reports the medial aspect of her right ankle has been bothering her recently. Occasionally it will catch. It started to swell. There has been some tenderness and pain when she walks on it. No injury.  PMH: nonsmoker.   ROS see history of present illness  Objective  Physical Exam Vitals:   06/14/17 0844  BP: 128/82  Pulse: 71  Temp: 97.7 F (36.5 C)  SpO2: 93%    BP Readings from Last 3 Encounters:  06/14/17 128/82  04/02/17 126/78  03/02/17 138/80   Wt Readings from Last 3 Encounters:  06/14/17 178 lb 9.6 oz (81 kg)  04/02/17 178  lb (80.7 kg)  03/02/17 173 lb 9.6 oz (78.7 kg)    Physical Exam  Constitutional: No distress.  HENT:  Head: Normocephalic and atraumatic.  Mouth/Throat: Oropharynx is clear and moist. No oropharyngeal exudate.  Normal TMs  Cardiovascular: Normal rate, regular rhythm and normal heart sounds.   Pulmonary/Chest: Effort normal and breath sounds normal.  Musculoskeletal: She exhibits no edema.  Right little finger PIP joint with swelling and minimal erythema, mild tenderness, no warmth, right ankle with slight swelling medially and slight tenderness over the navicular bone, no tenderness elsewhere, 2+ DP pulse, right little finger appears warm and well-perfused  Neurological: She is alert. Gait normal.  Skin: Skin is warm and dry. She is not diaphoretic.     Assessment/Plan: Please see individual problem list.  Hypertension Well-controlled. Check lab work today. Patient with very minimal exercise intolerance that has been going on for many years and has been unchanged. Suspect related to deconditioning. Chest x-ray and pulmonology evaluation previously. Discussed possible cardiac cause and offered referral to cardiology though she deferred. If this changes or worsens she'll be reevaluated and consider cardiology referral.  Allergic rhinitis Patient symptoms consistent with allergic rhinitis. Discussed adding Flonase or Claritin. She'll monitor.  History of carotid stenosis Carotid ultrasound ordered to follow-up on this.  Acute right ankle pain Suspect osteoarthritis a given she is tender we'll check an x-ray to evaluate for other causes. We'll consider prescription strength anti-inflammatories once or any function returns.  Osteoarthritis Suspect discomfort  in right little finger is related to osteoarthritis particularly given x-ray previously showing arthritis in her hand. Once her kidney function returns we will consider prescription strength anti-inflammatory for short course. If not  improving consider repeat x-ray.   Orders Placed This Encounter  Procedures  . DG Ankle Complete Right    Standing Status:   Future    Number of Occurrences:   1    Standing Expiration Date:   08/14/2018    Order Specific Question:   Reason for Exam (SYMPTOM  OR DIAGNOSIS REQUIRED)    Answer:   right ankle pain and swelling for several weeks    Order Specific Question:   Preferred imaging location?    Answer:   [facility]    Order Specific Question:   Radiology Contrast Protocol - do NOT remove file path    Answer:   [email or web address]s.pdf  . Lipid Profile  . Comp Met (CMET)    Meds ordered this encounter  Medications  . alendronate (FOSAMAX) 35 MG tablet    Sig: Take 1 tablet (35 mg total) by mouth every 7 (seven) days. Take with a full glass of water on an empty stomach.    Dispense:  12 tablet    Refill:  0  . pravastatin (PRAVACHOL) 40 MG tablet    Sig: Take 1 tablet (40 mg total) by mouth daily.    Dispense:  90 tablet    Refill:  1  . valsartan (DIOVAN) 80 MG tablet    Sig: Take 1 tablet (80 mg total) by mouth daily.    Dispense:  90 tablet    Refill:  1   Eric Sonnenberg, MD [facility]  

## 2017-06-14 NOTE — Assessment & Plan Note (Signed)
Suspect osteoarthritis a given she is tender we'll check an x-ray to evaluate for other causes. We'll consider prescription strength anti-inflammatories once or any function returns.

## 2017-06-14 NOTE — Patient Instructions (Signed)
Nice to see you. We'll check lab work and contact you with the results. We will get you set up for an ultrasound of the carotid arteries. We'll x-ray your right ankle. Once that returns we will send an anti-inflammatory in for you to take for your pinky finger and ankle. You can try Claritin for postnasal drip.

## 2017-06-14 NOTE — Assessment & Plan Note (Signed)
Patient symptoms consistent with allergic rhinitis. Discussed adding Flonase or Claritin. She'll monitor.

## 2017-06-19 DIAGNOSIS — Z23 Encounter for immunization: Secondary | ICD-10-CM | POA: Diagnosis not present

## 2017-07-11 ENCOUNTER — Ambulatory Visit (INDEPENDENT_AMBULATORY_CARE_PROVIDER_SITE_OTHER): Payer: Medicare Other

## 2017-07-11 DIAGNOSIS — Z8679 Personal history of other diseases of the circulatory system: Secondary | ICD-10-CM | POA: Diagnosis not present

## 2017-08-06 ENCOUNTER — Ambulatory Visit: Payer: Self-pay | Admitting: *Deleted

## 2017-08-06 NOTE — Telephone Encounter (Signed)
Can be scheduled with NP if needed

## 2017-08-06 NOTE — Telephone Encounter (Signed)
Patient stated this pain is on going joint pain she has discussed in past visits with PCP scheduled for 08/28/17.

## 2017-08-06 NOTE — Telephone Encounter (Signed)
I'm not authorized to get her into any of the appts available.   Can you get her scheduled for a Harrell Lark, or Fri?   I tried with Joycelyn Schmid however she does not have anything available.   Thanks

## 2017-08-06 NOTE — Telephone Encounter (Signed)
Attempted to get an appt for her but everything is full with the providers.   A note was sent to the nurse pool at [facility] to see if they can work her in.    Pt was agreeable to this.  Reason for Disposition . [1] MILD pain (e.g., does not interfere with normal activities) AND [2] present > 7 days  Answer Assessment - Initial Assessment Questions 1. LOCATION and RADIATION: "Where is the pain located?"      Both knees 2. QUALITY: "What does the pain feel like?"  (e.g., sharp, dull, aching, burning)     Aching 3. SEVERITY: "How bad is the pain?" "What does it keep you from doing?"   (Scale 1-10; or mild, moderate, severe)   -  MILD (1-3): doesn't interfere with normal activities    -  MODERATE (4-7): interferes with normal activities (e.g., work or school) or awakens from sleep, limping    -  SEVERE (8-10): excruciating pain, unable to do any normal activities, unable to walk     Feel stiff.   Can do ADLs.   Did exercise class on Friday was so sore on Saturday. 4. ONSET: "When did the pain start?" "Does it come and go, or is it there all the time?"     Pain started on Saturday.  Had pain prior but worse after exercise class. 5. RECURRENT: "Have you had this pain before?" If so, ask: "When, and what happened then?"     No.  Started in August with finger. 6. SETTING: "Has there been any recent work, exercise or other activity that involved that part of the body?"      Exercise class on Friday 7. AGGRAVATING FACTORS: "What makes the knee pain worse?" (e.g., walking, climbing stairs, running)     Exercise class 8. ASSOCIATED SYMPTOMS: "Is there any swelling or redness of the knee?"     No 9. OTHER SYMPTOMS: "Do you have any other symptoms?" (e.g., chest pain, difficulty breathing, fever, calf pain)     Thumb is also hurting and stiff in the joint. 10. PREGNANCY: "Is there any chance you are pregnant?" "When was your last menstrual period?"       N/A  Protocols used: KNEE  PAIN-A-AH

## 2017-08-28 ENCOUNTER — Ambulatory Visit (INDEPENDENT_AMBULATORY_CARE_PROVIDER_SITE_OTHER): Payer: Medicare Other | Admitting: Family Medicine

## 2017-08-28 ENCOUNTER — Other Ambulatory Visit: Payer: Self-pay

## 2017-08-28 ENCOUNTER — Ambulatory Visit (INDEPENDENT_AMBULATORY_CARE_PROVIDER_SITE_OTHER): Payer: Medicare Other

## 2017-08-28 ENCOUNTER — Encounter: Payer: Self-pay | Admitting: Family Medicine

## 2017-08-28 VITALS — BP 142/60 | HR 74 | Temp 97.5°F | Wt 183.2 lb

## 2017-08-28 DIAGNOSIS — M26609 Unspecified temporomandibular joint disorder, unspecified side: Secondary | ICD-10-CM | POA: Diagnosis not present

## 2017-08-28 DIAGNOSIS — M25571 Pain in right ankle and joints of right foot: Secondary | ICD-10-CM

## 2017-08-28 DIAGNOSIS — M25641 Stiffness of right hand, not elsewhere classified: Secondary | ICD-10-CM | POA: Insufficient documentation

## 2017-08-28 DIAGNOSIS — M7989 Other specified soft tissue disorders: Secondary | ICD-10-CM | POA: Insufficient documentation

## 2017-08-28 DIAGNOSIS — R600 Localized edema: Secondary | ICD-10-CM

## 2017-08-28 DIAGNOSIS — L409 Psoriasis, unspecified: Secondary | ICD-10-CM

## 2017-08-28 DIAGNOSIS — E785 Hyperlipidemia, unspecified: Secondary | ICD-10-CM

## 2017-08-28 LAB — COMPREHENSIVE METABOLIC PANEL
ALT: 13 U/L (ref 0–35)
AST: 15 U/L (ref 0–37)
Albumin: 4 g/dL (ref 3.5–5.2)
Alkaline Phosphatase: 78 U/L (ref 39–117)
BUN: 12 mg/dL (ref 6–23)
CO2: 29 mEq/L (ref 19–32)
Calcium: 9.4 mg/dL (ref 8.4–10.5)
Chloride: 105 mEq/L (ref 96–112)
Creatinine, Ser: 0.6 mg/dL (ref 0.40–1.20)
GFR: 102.81 mL/min (ref >60.00)
Glucose, Bld: 127 mg/dL — ABNORMAL HIGH (ref 70–99)
Potassium: 3.9 mEq/L (ref 3.5–5.1)
Sodium: 140 mEq/L (ref 135–145)
Total Bilirubin: 0.4 mg/dL (ref 0.2–1.2)
Total Protein: 7 g/dL (ref 6.0–8.3)

## 2017-08-28 LAB — CBC
HCT: 37 % (ref 36.0–46.0)
Hemoglobin: 12.2 g/dL (ref 12.0–15.0)
MCHC: 33.1 g/dL (ref 30.0–36.0)
MCV: 93.2 fl (ref 78.0–100.0)
Platelets: 255 10*3/uL (ref 150.0–400.0)
RBC: 3.97 Mil/uL (ref 3.87–5.11)
RDW: 12.8 % (ref 11.5–15.5)
WBC: 7.4 10*3/uL (ref 4.0–10.5)

## 2017-08-28 LAB — TSH: TSH: 0.92 u[IU]/mL (ref 0.35–4.50)

## 2017-08-28 LAB — LDL CHOLESTEROL, DIRECT: Direct LDL: 68 mg/dL

## 2017-08-28 NOTE — Assessment & Plan Note (Signed)
Taking Crestor.  Due for recheck.

## 2017-08-28 NOTE — Assessment & Plan Note (Signed)
Suspect positionally related as it has resolved.  No CHF symptoms.  Will check TSH and CBC.

## 2017-08-28 NOTE — Assessment & Plan Note (Signed)
Concern for psoriatic arthritis given history of psoriasis.  Will obtain an x-ray of her right hand.  Will refer to rheumatology.

## 2017-08-28 NOTE — Patient Instructions (Signed)
Nice to see you. We will get an x-ray of your right hand. We will refer you to rheumatology. We will get some lab work today and contact you with the results.

## 2017-08-28 NOTE — Assessment & Plan Note (Signed)
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  Potentially could be arthritis related.  She will monitor given that they have resolved.

## 2017-08-28 NOTE — Progress Notes (Signed)
Tommi Rumps, MD Phone: [phone]  Alison Huffman is a 76 y.o. female who presents today for same day visit.  Patient continues to have issues with stiffness in her right hand.  Does note some joint swelling in her right hand particularly index and middle finger MCP joints and pinky PIP joint.  She tried meloxicam with little benefit.  Aleve has not been beneficial.  Right ankle has improved.  She notes the stiffness is worse after exercising and after crafting.  She reports some TMJ discomfort for about a week several weeks ago.  She does wear a mouth splint.  Notes this has resolved.  She noted some feet and ankle swelling while she was at the beach.  Occurred after she was sitting for several hours.  Has resolved.  No shortness of breath or orthopnea.  Most recent CMP normal.  Has been on crestor for one month. Needs repeat labs.  Social History   Tobacco Use  Smoking Status Never Smoker  Smokeless Tobacco Never Used     ROS see history of present illness  Objective  Physical Exam Vitals:   08/28/17 1308  BP: (!) 142/60  Pulse: 74  Temp: (!) 97.5 F (36.4 C)  SpO2: 95%    BP Readings from Last 3 Encounters:  08/28/17 (!) 142/60  06/14/17 128/82  04/02/17 126/78   Wt Readings from Last 3 Encounters:  08/28/17 183 lb 3.2 oz (83.1 kg)  06/14/17 178 lb 9.6 oz (81 kg)  04/02/17 178 lb (80.7 kg)    Physical Exam  Constitutional: No distress.  HENT:  TMJ bilaterally nontender  Cardiovascular: Normal rate, regular rhythm and normal heart sounds.  Pulmonary/Chest: Effort normal and breath sounds normal.  Musculoskeletal:  Right hand index and middle finger MCP joints with joint enlargement, no warmth or erythema or tenderness, little finger with PIP joint enlargement with no warmth, erythema, or tenderness, 5/5 grip strength bilaterally, hands are warm and well-perfused, right ankle with no swelling or tenderness  Neurological: She is alert.  Skin: Skin is  warm and dry. She is not diaphoretic.     Assessment/Plan: Please see individual problem list.  Acute right ankle pain Improved.  Stiffness of right hand joint Concern for psoriatic arthritis given history of psoriasis.  Will obtain an x-ray of her right hand.  Will refer to rheumatology.  Bilateral leg edema Suspect positionally related as it has resolved.  No CHF symptoms.  Will check TSH and CBC.  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  Potentially could be arthritis related.  She will monitor given that they have resolved.  Hyperlipidemia Taking Crestor.  Due for recheck.   Julita was seen today for hand pain.  Diagnoses and all orders for this visit:  Stiffness of right hand joint -     Ambulatory referral to Rheumatology -     DG Hand Complete Right; Future  Psoriasis -     Ambulatory referral to Rheumatology  Bilateral leg edema -     CBC -     TSH  Acute right ankle pain  TMJ dysfunction  Hyperlipidemia, unspecified hyperlipidemia type -     Direct LDL -     Comp Met (CMET)    Orders Placed This Encounter  Procedures  . DG Hand Complete Right    Standing Status:   Future    Standing Expiration Date:   10/28/2018    Order Specific Question:   Reason for Exam (SYMPTOM  OR DIAGNOSIS REQUIRED)    Answer:   right hand stiffness, joint enlargement index/middle MCP, little finger PIP    Order Specific Question:   Preferred imaging location?    Answer:   [facility] Specific Question:   Radiology Contrast Protocol - do NOT remove file path    Answer:   [email or web address]  . CBC  . TSH  . Direct LDL  . Comp Met (CMET)  . Ambulatory referral to Rheumatology    Referral Priority:   Routine    Referral Type:   Consultation    Referral Reason:   Specialty Services Required    Requested Specialty:   Rheumatology    Number of Visits Requested:   1    No orders  of the defined types were placed in this encounter.    Tommi Rumps, MD [facility]

## 2017-08-28 NOTE — Assessment & Plan Note (Signed)
Improved

## 2017-09-01 ENCOUNTER — Other Ambulatory Visit: Payer: Self-pay | Admitting: Family Medicine

## 2017-09-01 DIAGNOSIS — R7309 Other abnormal glucose: Secondary | ICD-10-CM

## 2017-09-17 ENCOUNTER — Ambulatory Visit: Payer: TRICARE For Life (TFL) | Admitting: Family Medicine

## 2017-09-19 ENCOUNTER — Encounter: Payer: Self-pay | Admitting: Family Medicine

## 2017-09-19 ENCOUNTER — Other Ambulatory Visit (INDEPENDENT_AMBULATORY_CARE_PROVIDER_SITE_OTHER): Payer: Medicare Other

## 2017-09-19 DIAGNOSIS — R7303 Prediabetes: Secondary | ICD-10-CM | POA: Insufficient documentation

## 2017-09-19 DIAGNOSIS — R7309 Other abnormal glucose: Secondary | ICD-10-CM | POA: Diagnosis not present

## 2017-09-19 LAB — HEMOGLOBIN A1C: Hgb A1c MFr Bld: 6.1 % (ref 4.6–6.5)

## 2017-09-27 ENCOUNTER — Other Ambulatory Visit: Payer: Self-pay | Admitting: Family Medicine

## 2017-09-27 DIAGNOSIS — M25571 Pain in right ankle and joints of right foot: Secondary | ICD-10-CM | POA: Diagnosis not present

## 2017-09-27 DIAGNOSIS — L409 Psoriasis, unspecified: Secondary | ICD-10-CM | POA: Diagnosis not present

## 2017-09-27 DIAGNOSIS — G8929 Other chronic pain: Secondary | ICD-10-CM | POA: Diagnosis not present

## 2017-09-27 DIAGNOSIS — M25541 Pain in joints of right hand: Secondary | ICD-10-CM | POA: Diagnosis not present

## 2017-09-28 NOTE — Telephone Encounter (Signed)
Last OV 08/28/17 last filed under historical, patient states she does use this once weekly please [facility]

## 2017-10-10 DIAGNOSIS — L409 Psoriasis, unspecified: Secondary | ICD-10-CM | POA: Diagnosis not present

## 2017-10-10 DIAGNOSIS — M25541 Pain in joints of right hand: Secondary | ICD-10-CM | POA: Diagnosis not present

## 2017-10-10 DIAGNOSIS — M1990 Unspecified osteoarthritis, unspecified site: Secondary | ICD-10-CM | POA: Diagnosis not present

## 2017-12-10 DIAGNOSIS — L409 Psoriasis, unspecified: Secondary | ICD-10-CM | POA: Diagnosis not present

## 2017-12-10 DIAGNOSIS — M25541 Pain in joints of right hand: Secondary | ICD-10-CM | POA: Diagnosis not present

## 2017-12-10 DIAGNOSIS — Z79899 Other long term (current) drug therapy: Secondary | ICD-10-CM | POA: Diagnosis not present

## 2017-12-10 DIAGNOSIS — L4050 Arthropathic psoriasis, unspecified: Secondary | ICD-10-CM | POA: Diagnosis not present

## 2017-12-18 ENCOUNTER — Other Ambulatory Visit: Payer: Self-pay

## 2017-12-18 ENCOUNTER — Encounter: Payer: Self-pay | Admitting: Family Medicine

## 2017-12-18 ENCOUNTER — Ambulatory Visit (INDEPENDENT_AMBULATORY_CARE_PROVIDER_SITE_OTHER): Payer: Medicare Other | Admitting: Family Medicine

## 2017-12-18 VITALS — BP 118/74 | HR 71 | Temp 97.5°F | Wt 176.4 lb

## 2017-12-18 DIAGNOSIS — L4050 Arthropathic psoriasis, unspecified: Secondary | ICD-10-CM | POA: Diagnosis not present

## 2017-12-18 DIAGNOSIS — G4733 Obstructive sleep apnea (adult) (pediatric): Secondary | ICD-10-CM

## 2017-12-18 DIAGNOSIS — I10 Essential (primary) hypertension: Secondary | ICD-10-CM

## 2017-12-18 DIAGNOSIS — M8580 Other specified disorders of bone density and structure, unspecified site: Secondary | ICD-10-CM | POA: Diagnosis not present

## 2017-12-18 DIAGNOSIS — M7062 Trochanteric bursitis, left hip: Secondary | ICD-10-CM | POA: Diagnosis not present

## 2017-12-18 DIAGNOSIS — R7303 Prediabetes: Secondary | ICD-10-CM

## 2017-12-18 DIAGNOSIS — M7061 Trochanteric bursitis, right hip: Secondary | ICD-10-CM

## 2017-12-18 DIAGNOSIS — M26609 Unspecified temporomandibular joint disorder, unspecified side: Secondary | ICD-10-CM

## 2017-12-18 LAB — POCT GLYCOSYLATED HEMOGLOBIN (HGB A1C): Hemoglobin A1C: 5.6

## 2017-12-18 MED ORDER — ALENDRONATE SODIUM 35 MG PO TABS
ORAL_TABLET | ORAL | 3 refills | Status: DC
Start: 2017-12-18 — End: 2018-08-06

## 2017-12-18 MED ORDER — VALSARTAN 80 MG PO TABS: 80.0000 mg | ORAL_TABLET | Freq: Every day | ORAL | 3 refills | Status: DC

## 2017-12-18 MED ORDER — ROSUVASTATIN CALCIUM 20 MG PO TABS: 20.0000 mg | ORAL_TABLET | Freq: Every day | ORAL | 3 refills | Status: DC

## 2017-12-18 NOTE — Assessment & Plan Note (Signed)
She will continue the mouth apparatus for now.  We will have her reevaluated by pulmonology to consider change of mouth apparatus.

## 2017-12-18 NOTE — Assessment & Plan Note (Signed)
Check A1c. 

## 2017-12-18 NOTE — Assessment & Plan Note (Signed)
Suspect bursitis.  Will provide with exercises.  If not improving she could see rheumatology or sports medicine for an injection.

## 2017-12-18 NOTE — Assessment & Plan Note (Signed)
Has been on Fosamax.  Due for DEXA scan in August 2019.

## 2017-12-18 NOTE — Progress Notes (Signed)
Tommi Rumps, MD Phone: [phone]  Alison Huffman is a 77 y.o. female who presents today for f/u.  HYPERTENSION  Disease Monitoring  Home BP Monitoring not checking Chest pain- no    Dyspnea- no Medications  Compliance-  Taking valsartan.  Edema- no  Osteopenia: Currently on Fosamax.  Rarely has issues with reflux.  No difficulty otherwise with the medication.  No fractures.  Does not take calcium.  Does take vitamin D.  DEXA scan will be due in August 2019.  Psoriatic arthritis: Recently diagnosed by rheumatology.  She is supposed to start on methotrexate and folate.  She did take prednisone which was quite helpful with her aching.  She follows up with rheumatology in 6 weeks.  Occasionally her right hip will bother her particularly when she stands for long period of time or lays on it.  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  Notes some discomfort and feels like there is a grinding in the joint at times.  Typically only occurs in the morning when she takes her sleep apnea mouth apparatus out of her mouth.  She did see a dentist and they just told her to take Tylenol.  OSA: Well-controlled with her mouth apparatus.  She sleeps well.  Does wake up well rested.  Prediabetes: Due for A1c.  Social History   Tobacco Use  Smoking Status Never Smoker  Smokeless Tobacco Never Used     ROS see history of present illness  Objective  Physical Exam Vitals:   12/18/17 0829  BP: 118/74  Pulse: 71  Temp: (!) 97.5 F (36.4 C)  SpO2: 93%    BP Readings from Last 3 Encounters:  12/18/17 118/74  08/28/17 (!) 142/60  06/14/17 128/82   Wt Readings from Last 3 Encounters:  12/18/17 176 lb 6.4 oz (80 kg)  08/28/17 183 lb 3.2 oz (83.1 kg)  06/14/17 178 lb 9.6 oz (81 kg)    Physical Exam  Constitutional: No distress.  HENT:  No tenderness of either TMJ, there is a palpable grinding sensation in bilateral TMJs  Cardiovascular: Normal rate, regular rhythm and  normal heart sounds.  Pulmonary/Chest: Effort normal and breath sounds normal.  Musculoskeletal: She exhibits no edema.  Slight tenderness over the right greater trochanter slightly greater than left greater trochanter, full range of motion bilateral hips  Neurological: She is alert. Gait normal.  Skin: Skin is warm and dry. She is not diaphoretic.     Assessment/Plan: Please see individual problem list.  Hypertension Well-controlled.  Continue valsartan.  OSA (obstructive sleep apnea) She will continue the mouth apparatus for now.  We will have her reevaluated by pulmonology to consider change of mouth apparatus.  TMJ dysfunction Consistent with TMJ dysfunction.  Suspect her mouth apparatus is contributing.  We will have her evaluated by pulmonology for the mouth apparatus.  Osteopenia Has been on Fosamax.  Due for DEXA scan in August 2019.  Prediabetes Check A1c.  Greater trochanteric bursitis of both hips Suspect bursitis.  Will provide with exercises.  If not improving she could see rheumatology or sports medicine for an injection.  Psoriatic arthritis ([place]) She will continue to follow with rheumatology.  She is going to start on methotrexate.   Health Maintenance: Request colonoscopy reports.  Will check with clinical pharmacist regarding methotrexate and Shingrix.  Orders Placed This Encounter  Procedures  . POCT HgB A1C    No orders of the defined types were placed in this encounter.    Randall Hiss  Caryl Bis, MD [place]

## 2017-12-18 NOTE — Patient Instructions (Addendum)
Nice to see you. We will get you into pulmonology for follow-up. Please complete the exercises for your hips.   Trochanteric Bursitis Rehab Ask your health care provider which exercises are safe for you. Do exercises exactly as told by your health care provider and adjust them as directed. It is normal to feel mild stretching, pulling, tightness, or discomfort as you do these exercises, but you should stop right away if you feel sudden pain or your pain gets worse.Do not begin these exercises until told by your health care provider. Stretching exercises These exercises warm up your muscles and joints and improve the movement and flexibility of your hip. These exercises also help to relieve pain and stiffness. Exercise A: Iliotibial band stretch  1. Lie on your side with your left / right leg in the top position. 2. Bend your left / right knee and grab your ankle. 3. Slowly bring your knee back so your thigh is behind your body. 4. Slowly lower your knee toward the floor until you feel a gentle stretch on the outside of your left / right thigh. If you do not feel a stretch and your knee will not fall farther, place the heel of your other foot on top of your outer knee and pull your thigh down farther. 5. Hold this position for __________ seconds. 6. Slowly return to the starting position. Repeat __________ times. Complete this exercise __________ times a day. Strengthening exercises These exercises build strength and endurance in your hip and pelvis. Endurance is the ability to use your muscles for a long time, even after they get tired. Exercise B: Bridge ( hip extensors) 1. Lie on your back on a firm surface with your knees bent and your feet flat on the floor. 2. Tighten your buttocks muscles and lift your buttocks off the floor until your trunk is level with your thighs. You should feel the muscles working in your buttocks and the back of your thighs. If this exercise is too easy, try  doing it with your arms crossed over your chest. 3. Hold this position for __________ seconds. 4. Slowly return to the starting position. 5. Let your muscles relax completely between repetitions. Repeat __________ times. Complete this exercise __________ times a day. Exercise C: Squats ( knee extensors and  quadriceps) 1. Stand in front of a table, with your feet and knees pointing straight ahead. You may rest your hands on the table for balance but not for support. 2. Slowly bend your knees and lower your hips like you are going to sit in a chair. ? Keep your weight over your heels, not over your toes. ? Keep your lower legs upright so they are parallel with the table legs. ? Do not let your hips go lower than your knees. ? Do not bend lower than told by your health care provider. ? If your hip pain increases, do not bend as low. 3. Hold this position for __________ seconds. 4. Slowly push with your legs to return to standing. Do not use your hands to pull yourself to standing. Repeat __________ times. Complete this exercise __________ times a day. Exercise D: Hip hike 1. Stand sideways on a bottom step. Stand on your left / right leg with your other foot unsupported next to the step. You can hold onto the railing or wall if needed for balance. 2. Keeping your knees straight and your torso square, lift your left / right hip up toward the ceiling. 3. Hold this position  for __________ seconds. 4. Slowly let your left / right hip lower toward the floor, past the starting position. Your foot should get closer to the floor. Do not lean or bend your knees. Repeat __________ times. Complete this exercise __________ times a day. Exercise E: Single leg stand 1. Stand near a counter or door frame that you can hold onto for balance as needed. It is helpful to stand in front of a mirror for this exercise so you can watch your hip. 2. Squeeze your left / right buttock muscles then lift up your other  foot. Do not let your left / right hip push out to the side. 3. Hold this position for __________ seconds. Repeat __________ times. Complete this exercise __________ times a day. This information is not intended to replace advice given to you by your health care provider. Make sure you discuss any questions you have with your health care provider. Document Released: 10/26/2004 Document Revised: 05/25/2016 Document Reviewed: 09/03/2015 Elsevier Interactive Patient Education  [employer].

## 2017-12-18 NOTE — Assessment & Plan Note (Signed)
Well controlled. Continue valsartan ?

## 2017-12-18 NOTE — Assessment & Plan Note (Signed)
She will continue to follow with rheumatology.  She is going to start on methotrexate.

## 2017-12-18 NOTE — Assessment & Plan Note (Signed)
Consistent with TMJ dysfunction.  Suspect her mouth apparatus is contributing.  We will have her evaluated by pulmonology for the mouth apparatus.

## 2017-12-19 ENCOUNTER — Telehealth: Payer: Self-pay | Admitting: Family Medicine

## 2017-12-19 DIAGNOSIS — H10212 Acute toxic conjunctivitis, left eye: Secondary | ICD-10-CM | POA: Diagnosis not present

## 2017-12-19 NOTE — Telephone Encounter (Signed)
Please let the patient know that I heard back from the pharmacist and she noted that the Shingrix vaccine for shingles is inactivated and does not contain live virus.  The patient should be able to get the Shingrix vaccine once she is on methotrexate.  Thanks.

## 2017-12-19 NOTE — Telephone Encounter (Signed)
Left message to return call, ok for pec to speak to patient and notify her of message below

## 2017-12-19 NOTE — Telephone Encounter (Signed)
-----   Message from Carlean Jews, [facility] sent at 12/18/2017  1:16 PM EDT ----- Gwenlyn Saran right - it's inactivated. No issues giving together  Thanks! Chrys Racer ----- Message ----- From: Leone Haven, MD Sent: 12/18/2017   9:03 AM To: Carlean Jews, [facility]  Marianne Sofia,   This patient is about to start on methotrexate and she wondered about the shingrix vaccine. I thought this was an inactivated vaccine and would be ok to get after starting methotrexate. I wanted to confirm this. Thanks for your help.   Randall Hiss

## 2017-12-24 ENCOUNTER — Encounter: Payer: Self-pay | Admitting: Family Medicine

## 2017-12-25 NOTE — Telephone Encounter (Signed)
Patient notified

## 2017-12-29 ENCOUNTER — Other Ambulatory Visit: Payer: Self-pay | Admitting: Family Medicine

## 2018-01-10 ENCOUNTER — Ambulatory Visit: Payer: TRICARE For Life (TFL) | Admitting: Internal Medicine

## 2018-01-13 NOTE — Progress Notes (Signed)
[facility] Consultation      Assessment and Plan:  Obstructive sleep apnea. -Known obstructive sleep apnea, diagnosed 2015. Currently being treated with a dental device, has been developing progressive jaw pain and "jaw slipping", having difficulty using the device.  She is interested in starting on CPAP. -We will send for split-night sleep study in order to requalify for CPAP.   Date: 01/13/2018  MRN# [id number] Alison Huffman Feb 17, 1941    Alison Huffman is a 77 y.o. old female seen in consultation for chief complaint of:    Chief Complaint  Patient presents with  . Sleep Apnea    pt needs to discuss new referral or other options for new dental device. She is having jaw pain started mid-Feb.    HPI:   The patient is a 77 year old female (though looks considerably younger) with a history of obstructive sleep apnea, was using a dental appliance since 2014 and had been doing well with it.  She lived in [place], but recently moved down here. She was diagnosed with OSA in around 2015 when it was found that she had an abnormality of the optic nerve.  However over the past 5 months she has been having more difficulty with left TMJ pain which has been getting progressively worse. She can chew without problems, but it hurts when she has to take it out, particularly she feels that her jaw "slips" and then moves back into place when she closes.  She was started on methotrexate about 3 weeks ago for psoriatic arthritis.   She does notice that she gets winded faster than people her own age. She has never been a smoker.    Medication:    Current Outpatient Medications:  .  alendronate (FOSAMAX) 35 MG tablet, TAKE 1 TABLET EVERY 7 DAYS WITH A FULL GLASS OF WATER ON AN EMPTY STOMACH, Disp: 12 tablet, Rfl: 3 .  aspirin EC 81 MG tablet, Take 81 mg by mouth daily., Disp: , Rfl:  .  Cholecalciferol (VITAMIN D) 2000 units CAPS, Take by mouth., Disp: , Rfl:  .  Clobetasol  Prop Emollient Base (CLOBETASOL PROPIONATE E) 0.05 % emollient cream, Apply topically once a week. , Disp: , Rfl:  .  desonide (DESOWEN) 0.05 % ointment, Apply 1 application topically 2 (two) times daily., Disp: , Rfl:  .  folic acid (FOLVITE) 1 MG tablet, Take 1 mg by mouth daily., Disp: , Rfl:  .  methotrexate (RHEUMATREX) 2.5 MG tablet, Take 2.5 mg by mouth. 6 Tablets once a week, Disp: , Rfl:  .  metroNIDAZOLE (METROCREAM) 0.75 % cream, Apply topically 2 (two) times daily., Disp: , Rfl:  .  nystatin cream (MYCOSTATIN), Apply 1 application topically 2 (two) times daily., Disp: , Rfl:  .  ranitidine (ZANTAC) 150 MG tablet, Take 150 mg by mouth as needed. , Disp: , Rfl:  .  rosuvastatin (CRESTOR) 20 MG tablet, Take 1 tablet (20 mg total) by mouth daily., Disp: 90 tablet, Rfl: 3 .  tacrolimus (PROTOPIC) 0.1 % ointment, Apply topically 2 (two) times daily., Disp: , Rfl:  .  valsartan (DIOVAN) 80 MG tablet, Take 1 tablet (80 mg total) by mouth daily., Disp: 90 tablet, Rfl: 3   Allergies:  Patient has no known allergies.  Review of Systems: Gen:  Denies  fever, sweats, chills HEENT: Denies blurred vision, double vision. bleeds, sore throat Cvc:  No dizziness, chest pain. Resp:   Denies cough or sputum production, shortness of breath Gi: Denies  swallowing difficulty, stomach pain. Gu:  Denies bladder incontinence, burning urine Ext:   No Joint pain, stiffness. Skin: No skin rash,  hives  Endoc:  No polyuria, polydipsia. Psych: No depression, insomnia. Other:  All other systems were reviewed with the patient and were negative other that what is mentioned in the HPI.   Physical Examination:   VS: BP 112/72 (BP Location: Left Arm, Cuff Size: Normal)   Pulse 83   Resp 16   Ht 5' 5.5" (1.664 m)   Wt 177 lb (80.3 kg)   SpO2 95%   BMI 29.01 kg/m   General Appearance: No distress  Neuro:without focal findings,  speech normal,  HEENT: PERRLA, EOM intact.  [place] 3.  Pulmonary: normal  breath sounds, No wheezing.  CardiovascularNormal S1,S2.  No m/r/g.   Abdomen: Benign, Soft, non-tender. Renal:  No costovertebral tenderness  GU:  No performed at this time. Endoc: No evident thyromegaly, no signs of acromegaly. Skin:   warm, no rashes, no ecchymosis  Extremities: normal, no cyanosis, clubbing.  Other findings:    LABORATORY PANEL:   CBC No results for input(s): WBC, HGB, HCT, PLT in the last 168 hours. ------------------------------------------------------------------------------------------------------------------  Chemistries  No results for input(s): NA, K, CL, CO2, GLUCOSE, BUN, CREATININE, CALCIUM, MG, AST, ALT, ALKPHOS, BILITOT in the last 168 hours.  Invalid input(s): GFRCGP ------------------------------------------------------------------------------------------------------------------  Cardiac Enzymes No results for input(s): TROPONINI in the last 168 hours. ------------------------------------------------------------  RADIOLOGY:  No results found.     Thank  you for the consultation and for allowing [facility] to assist in the care of your patient. Our recommendations are noted above.  Please contact us if we can be of further service.   Marda Stalker, MD.  Board Certified in Internal Medicine, Pulmonary Medicine, [place], and Sleep Medicine.  [facility] Office Number: [phone]  Imogean Pesa, M.D.  Merton Border, M.D  01/13/2018

## 2018-01-14 ENCOUNTER — Encounter: Payer: Self-pay | Admitting: Internal Medicine

## 2018-01-14 ENCOUNTER — Ambulatory Visit (INDEPENDENT_AMBULATORY_CARE_PROVIDER_SITE_OTHER): Payer: Medicare Other | Admitting: Internal Medicine

## 2018-01-14 VITALS — BP 112/72 | HR 83 | Resp 16 | Ht 65.5 in | Wt 177.0 lb

## 2018-01-14 DIAGNOSIS — G4733 Obstructive sleep apnea (adult) (pediatric): Secondary | ICD-10-CM

## 2018-01-14 NOTE — Patient Instructions (Addendum)
Will send for new sleep study to start on CPAP.     Sleep Apnea       Sleep apnea is disorder that affects a person's sleep. A person with sleep apnea has abnormal pauses in their breathing when they sleep. It is hard for them to get a good sleep. This makes a person tired during the day. It also can lead to other physical problems. There are three types of sleep apnea. One type is when breathing stops for a short time because your airway is blocked (obstructive sleep apnea). Another type is when the brain sometimes fails to give the normal signal to breathe to the muscles that control your breathing (central sleep apnea). The third type is a combination of the other two types. HOME CARE   Take all medicine as told by your doctor.  Avoid alcohol, calming medicines (sedatives), and depressant drugs.  Try to lose weight if you are overweight. Talk to your doctor about a healthy weight goal.  Your doctor may have you use a device that helps to open your airway. It can help you get the air that you need. It is called a positive airway pressure (PAP) device.   MAKE SURE YOU:   Understand these instructions.  Will watch your condition.  Will get help right away if you are not doing well or get worse.  It may take approximately 1 month for you to get used to wearing her CPAP every night.  Be sure to work with your machine to get used to it, be patient, it may take time!

## 2018-01-21 DIAGNOSIS — M25541 Pain in joints of right hand: Secondary | ICD-10-CM | POA: Diagnosis not present

## 2018-01-21 DIAGNOSIS — L409 Psoriasis, unspecified: Secondary | ICD-10-CM | POA: Diagnosis not present

## 2018-01-21 DIAGNOSIS — L4050 Arthropathic psoriasis, unspecified: Secondary | ICD-10-CM | POA: Diagnosis not present

## 2018-01-21 DIAGNOSIS — Z79899 Other long term (current) drug therapy: Secondary | ICD-10-CM | POA: Diagnosis not present

## 2018-01-24 ENCOUNTER — Ambulatory Visit: Payer: Medicare Other | Attending: Internal Medicine

## 2018-01-24 DIAGNOSIS — G4761 Periodic limb movement disorder: Secondary | ICD-10-CM | POA: Diagnosis not present

## 2018-01-24 DIAGNOSIS — Z461 Encounter for fitting and adjustment of hearing aid: Secondary | ICD-10-CM | POA: Diagnosis not present

## 2018-01-24 DIAGNOSIS — G4733 Obstructive sleep apnea (adult) (pediatric): Secondary | ICD-10-CM | POA: Diagnosis not present

## 2018-01-25 DIAGNOSIS — G4733 Obstructive sleep apnea (adult) (pediatric): Secondary | ICD-10-CM | POA: Diagnosis not present

## 2018-01-30 ENCOUNTER — Telehealth: Payer: Self-pay | Admitting: *Deleted

## 2018-01-30 DIAGNOSIS — G4733 Obstructive sleep apnea (adult) (pediatric): Secondary | ICD-10-CM

## 2018-01-30 NOTE — Telephone Encounter (Signed)
Patient returning call for results 

## 2018-01-30 NOTE — Telephone Encounter (Signed)
Left message to call back for results. Auto-cpap with pressure range of 5-15 cm H20. Sleep study date: 01/28/18

## 2018-01-30 NOTE — Telephone Encounter (Signed)
Patient aware of results Orders placed Nothing further needed. 

## 2018-03-04 DIAGNOSIS — L4050 Arthropathic psoriasis, unspecified: Secondary | ICD-10-CM | POA: Diagnosis not present

## 2018-03-04 DIAGNOSIS — Z79899 Other long term (current) drug therapy: Secondary | ICD-10-CM | POA: Diagnosis not present

## 2018-03-04 DIAGNOSIS — L409 Psoriasis, unspecified: Secondary | ICD-10-CM | POA: Diagnosis not present

## 2018-03-06 ENCOUNTER — Telehealth: Payer: Self-pay | Admitting: *Deleted

## 2018-03-06 NOTE — Telephone Encounter (Signed)
Left message for sleep med to call back re: results of sleep study. Dr. Juanell Fairly has a question re: total time in bed= 70 min and time in bed 34.5 min. He believes this is an error. Patient insurance will not cover b/c study did not last 120 min.

## 2018-03-07 ENCOUNTER — Other Ambulatory Visit: Payer: Self-pay | Admitting: *Deleted

## 2018-03-07 DIAGNOSIS — G4719 Other hypersomnia: Secondary | ICD-10-CM

## 2018-03-07 NOTE — Telephone Encounter (Signed)
Michele from Sleep med called. She was not sure how to answer why patient was but on cpap so quickly during split night. She is going to speak with her supervisor and the tech. Someone will call back from [facility].

## 2018-03-07 NOTE — Telephone Encounter (Signed)
Selinda Eon with [facility] called back and said "pre split was started, then the tech saw an order for cpap titration". Patient was then switched and placed on a cpap. I was there mistake therefore they will correct by bringing patient back in for a PSG at no charge.  Information shared with Rodena Piety [facility] who will call patient to explain. Orders have been placed for PSG study. [facility] will contact patient to schedule. Nothing further needed.

## 2018-03-12 ENCOUNTER — Telehealth: Payer: Self-pay | Admitting: Internal Medicine

## 2018-03-12 NOTE — Telephone Encounter (Signed)
lmov to change appt. Provider not in clinic 7/18 Dr. Ashby Dawes

## 2018-03-13 ENCOUNTER — Ambulatory Visit: Payer: Medicare Other | Attending: Internal Medicine

## 2018-03-13 DIAGNOSIS — G4710 Hypersomnia, unspecified: Secondary | ICD-10-CM | POA: Diagnosis not present

## 2018-03-13 DIAGNOSIS — G4761 Periodic limb movement disorder: Secondary | ICD-10-CM | POA: Insufficient documentation

## 2018-03-13 DIAGNOSIS — G4733 Obstructive sleep apnea (adult) (pediatric): Secondary | ICD-10-CM | POA: Diagnosis not present

## 2018-03-17 ENCOUNTER — Encounter: Payer: Self-pay | Admitting: Family Medicine

## 2018-03-18 ENCOUNTER — Encounter: Payer: Self-pay | Admitting: Family Medicine

## 2018-03-20 ENCOUNTER — Telehealth: Payer: Self-pay | Admitting: *Deleted

## 2018-03-20 DIAGNOSIS — G4733 Obstructive sleep apnea (adult) (pediatric): Secondary | ICD-10-CM

## 2018-03-20 NOTE — Telephone Encounter (Signed)
Pt informed of titration results. Order placed. Nothing further needed.

## 2018-03-21 ENCOUNTER — Ambulatory Visit (INDEPENDENT_AMBULATORY_CARE_PROVIDER_SITE_OTHER): Payer: Medicare Other | Admitting: Family Medicine

## 2018-03-21 ENCOUNTER — Encounter: Payer: Self-pay | Admitting: Family Medicine

## 2018-03-21 VITALS — BP 140/82 | HR 68 | Temp 97.5°F | Ht 66.0 in | Wt 179.2 lb

## 2018-03-21 DIAGNOSIS — L4050 Arthropathic psoriasis, unspecified: Secondary | ICD-10-CM | POA: Diagnosis not present

## 2018-03-21 DIAGNOSIS — R0600 Dyspnea, unspecified: Secondary | ICD-10-CM | POA: Insufficient documentation

## 2018-03-21 DIAGNOSIS — I10 Essential (primary) hypertension: Secondary | ICD-10-CM | POA: Diagnosis not present

## 2018-03-21 DIAGNOSIS — I739 Peripheral vascular disease, unspecified: Secondary | ICD-10-CM | POA: Diagnosis not present

## 2018-03-21 DIAGNOSIS — L900 Lichen sclerosus et atrophicus: Secondary | ICD-10-CM | POA: Diagnosis not present

## 2018-03-21 DIAGNOSIS — E785 Hyperlipidemia, unspecified: Secondary | ICD-10-CM

## 2018-03-21 DIAGNOSIS — R0609 Other forms of dyspnea: Secondary | ICD-10-CM | POA: Insufficient documentation

## 2018-03-21 LAB — BASIC METABOLIC PANEL
BUN: 11 mg/dL (ref 6–23)
CO2: 29 mEq/L (ref 19–32)
Calcium: 9.6 mg/dL (ref 8.4–10.5)
Chloride: 103 mEq/L (ref 96–112)
Creatinine, Ser: 0.51 mg/dL (ref 0.40–1.20)
GFR: 123.84 mL/min (ref >60.00)
Glucose, Bld: 92 mg/dL (ref 70–99)
Potassium: 4.2 mEq/L (ref 3.5–5.1)
Sodium: 139 mEq/L (ref 135–145)

## 2018-03-21 LAB — TSH: TSH: 1.06 u[IU]/mL (ref 0.35–4.50)

## 2018-03-21 NOTE — Assessment & Plan Note (Signed)
Possibly deconditioning vs cardiac cause. EKG reassuring. Recent CBC with normal hemoglobin. Check TSH. Consider cardiology evaluation if lab negative. Given return precautions.

## 2018-03-21 NOTE — Assessment & Plan Note (Signed)
Continue to see dermatology

## 2018-03-21 NOTE — Telephone Encounter (Signed)
Patient has been rescheduled.

## 2018-03-21 NOTE — Progress Notes (Signed)
  Tommi Rumps, MD Phone: [phone]  Alison Huffman is a 77 y.o. female who presents today for f/u.  CC: htn, hld, psoriatic arthritis  HYPERTENSION  Disease Monitoring  Home BP Monitoring 130s/70s Chest pain- no    Dyspnea- yes see below Medications  Compliance-  Taking valsartan.  Edema- no  HYPERLIPIDEMIA Symptoms Chest pain on exertion:  no   Leg claudication:   See below Medications: Compliance- taking crestor Right upper quadrant pain- no  Muscle aches- no  Patient notes over the last few months she has noted slight increase in dyspnea on exertion.  Occurs if she rushes too much even just around the house.  It does improve with rest.  No chest pain associated with this.  No orthopnea or PND.  No personal cardiac history or family cardiac history.  No history of smoking.  Patient notes discomfort in her bilateral thighs anteriorly and laterally when she starts to walk at times.  This will resolve when she stops to rest.  She follows with rheumatology for psoriatic arthritis.  She is on methotrexate.  She does have some joint swelling in her hands with some soreness and stiffness.  She does have lichen sclerosis that is followed by dermatology. Notes this is stable.       Social History   Tobacco Use  Smoking Status Never Smoker  Smokeless Tobacco Never Used     ROS see history of present illness  Objective  Physical Exam Vitals:   03/21/18 0855  BP: 140/82  Pulse: 68  Temp: (!) 97.5 F (36.4 C)  SpO2: 96%    BP Readings from Last 3 Encounters:  03/21/18 140/82  01/14/18 112/72  12/18/17 118/74   Wt Readings from Last 3 Encounters:  03/21/18 179 lb 3.2 oz (81.3 kg)  01/14/18 177 lb (80.3 kg)  12/18/17 176 lb 6.4 oz (80 kg)    Physical Exam  Constitutional: No distress.  Cardiovascular: Normal rate, regular rhythm and normal heart sounds.  2+ DP and PT pulses bilaterally  Pulmonary/Chest: Effort normal and breath sounds normal.    Musculoskeletal: She exhibits no edema.  Chronic joint enlargement of righ 2nd and 3rd MCP joints with no erythema, warmth, or tenderness  Neurological: She is alert.  Skin: Skin is warm and dry. She is not diaphoretic.   EKG: NSR, rate 71, RBBB, no ischemic changes  Assessment/Plan: Please see individual problem list.  Hypertension At goal. Continue current regimen. Check BMET.   Lichen sclerosus Continue to see dermatology.  Psoriatic arthritis ([place]) Continue to see rheumatology.   Hyperlipidemia Has been well controlled. Continue crestor.  Intermittent claudication (HCC) Concern for claudication given symptoms. Normal pulses in feet would make this less likely. We will obtain ABIs.   Exertional dyspnea Possibly deconditioning vs cardiac cause. EKG reassuring. Recent CBC with normal hemoglobin. Check TSH. Consider cardiology evaluation if lab negative. Given return precautions.     Orders Placed This Encounter  Procedures  . Basic Metabolic Panel (BMET)  . TSH  . EKG 12-Lead    No orders of the defined types were placed in this encounter.    Tommi Rumps, MD [place]

## 2018-03-21 NOTE — Assessment & Plan Note (Signed)
Concern for claudication given symptoms. Normal pulses in feet would make this less likely. We will obtain ABIs.

## 2018-03-21 NOTE — Assessment & Plan Note (Signed)
Continue to see rheumatology.

## 2018-03-21 NOTE — Assessment & Plan Note (Signed)
Has been well controlled. Continue crestor.

## 2018-03-21 NOTE — Assessment & Plan Note (Signed)
At goal. Continue current regimen. Check BMET.

## 2018-03-21 NOTE — Patient Instructions (Signed)
Nice to see you. We will check labs and call with the results.  We will get ABIs of your legs.  If you develop chest pain or you develop persistent shortness of breath please be evaluated.

## 2018-03-22 ENCOUNTER — Telehealth: Payer: Self-pay | Admitting: Family Medicine

## 2018-03-22 NOTE — Telephone Encounter (Signed)
Copied from [place] [id number]. Topic: Quick Communication - Lab Results >> Mar 22, 2018  2:29 PM Juanda Chance, CMA wrote: Left message to return call, ok for [facility] to give results and speak to patient  >> Mar 22, 2018  4:31 PM Jarold Motto, Fraser Din wrote: Pt calling back for results

## 2018-03-25 ENCOUNTER — Other Ambulatory Visit: Payer: Self-pay | Admitting: Family Medicine

## 2018-03-25 DIAGNOSIS — R0600 Dyspnea, unspecified: Secondary | ICD-10-CM

## 2018-03-25 DIAGNOSIS — R0609 Other forms of dyspnea: Principal | ICD-10-CM

## 2018-03-25 NOTE — Telephone Encounter (Signed)
Left message on voicemail for pt to return call to office for results. See result note   

## 2018-04-10 NOTE — Progress Notes (Signed)
Cardiology Office Note  Date:  04/11/2018   ID:  Alison Huffman, DOB January 25, 1941, MRN [id number]  PCP:  Alison Haven, MD   Chief Complaint  Patient presents with  . other    Ref by Dr. Caryl Bis for shortness of breath. Meds reviewed by the pt. verbally. Pt. c/o bilateral leg pain at times and shortness of breath with over exertion.     HPI:  [name] s a 77 year old woman with past medical history of Hyperlipidemia Hypertension Psoriatic arthritis on methotrexate, "better" OSA, on CPAP Referred by Dr. Caryl Bis for shortness of breath on exertion  Was walking regular one year ago, before she moved to the area None in the past year Sedentary Weight trending upwards Muscle ache in the thighs No one to walk with  Always SOB, maybe worse past year going hiking always took her longer to recover  Seen by Dr. 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  PSH:    Past Surgical History:  Procedure Laterality Date  . APPENDECTOMY    . BREAST BIOPSY Left 1978    Current Outpatient Medications  Medication Sig Dispense Refill  . alendronate (FOSAMAX) 35 MG tablet TAKE 1 TABLET EVERY 7 DAYS WITH A FULL GLASS OF WATER ON AN EMPTY STOMACH 12 tablet 3  . aspirin EC 81 MG tablet Take 81 mg by mouth daily.    . Cholecalciferol (VITAMIN D) 2000 units CAPS Take by mouth.    . Clobetasol Prop Emollient Base (CLOBETASOL PROPIONATE E) 0.05 %  emollient cream Apply topically once a week.     . desonide (DESOWEN) 0.05 % ointment Apply 1 application topically 2 (two) times daily.    . folic acid (FOLVITE) 1 MG tablet Take 1 mg by mouth daily.    . methotrexate (RHEUMATREX) 2.5 MG tablet Take 2.5 mg by mouth. 6 Tablets once a week    . metroNIDAZOLE (METROCREAM) 0.75 % cream Apply topically 2 (two) times daily.    [name] nystatin cream (MYCOSTATIN) Apply 1 application topically 2 (two) times daily.    . ranitidine (ZANTAC) 150 MG tablet Take 150 mg by mouth as needed.     . rosuvastatin (CRESTOR) 20 MG tablet Take 1 tablet (20 mg total) by mouth daily. 90 tablet 3  . tacrolimus (PROTOPIC) 0.1 % ointment Apply topically 2 (two) times daily.    . valsartan (DIOVAN) 80 MG tablet Take 1 tablet (80 mg total) by mouth daily. 90 tablet 3   No current facility-administered medications for this visit.      Allergies:   Patient has no known allergies.   Social History:  The patient  reports that she has never smoked. She has never used smokeless tobacco. She reports that she drinks alcohol. She reports that she does not use drugs.   Family History:   family history includes Atrial fibrillation in her mother; Breast cancer in her maternal aunt, mother, and paternal aunt; Stroke in her father and mother.    Review of Systems:  Review of Systems  Constitutional: Negative.   Respiratory: Negative.   Cardiovascular: Negative.   Gastrointestinal: Negative.   Musculoskeletal: Negative.   Neurological: Negative.   Psychiatric/Behavioral: Negative.   All other systems reviewed and are negative.    PHYSICAL EXAM: VS:  BP 128/80 (BP Location: Right Arm, Patient Position: Sitting, Cuff Size: Normal)   Pulse 69   Ht 5' 6.5" (1.689 m)   Wt 178 lb 4 oz (80.9 kg)   BMI 28.34 kg/m  , BMI Body mass index is 28.34 kg/m. GEN: Well nourished, well developed, in no acute distress  HEENT: normal  Neck: no JVD, carotid bruits, or masses Cardiac: RRR;  no murmurs, rubs, or gallops,no edema  Respiratory:  clear to auscultation bilaterally, normal work of breathing GI: soft, nontender, nondistended, + BS MS: no deformity or atrophy  Skin: warm and dry, no rash Neuro:  Strength and sensation are intact Psych: euthymic mood, full affect   Recent Labs: 08/28/2017: ALT 13; Hemoglobin 12.2; Platelets 255.0 03/21/2018: BUN 11; Creatinine, Ser 0.51; Potassium 4.2; Sodium 139; TSH 1.06    Lipid Panel Lab Results  Component Value Date   CHOL 190 06/14/2017   HDL 66.90 06/14/2017   LDLCALC 108 (H) 06/14/2017   TRIG 77.0 06/14/2017      Wt Readings from Last 3 Encounters:  04/11/18 178 lb 4 oz (80.9 kg)  03/21/18 179 lb 3.2 oz (81.3 kg)  01/14/18 177 lb (80.3 kg)       ASSESSMENT AND PLAN:  Exertional dyspnea - Plan: EKG 12-Lead Sedentary, suspect her shortness of breath is from deconditioning Echocardiogram has been ordered to evaluate cardiac function in light of shortness of breath Strongly recommended regular walking program weight loss  Prediabetes We have encouraged continued exercise, careful diet management in an effort to lose weight.  Bilateral leg edema Venous, insignificant on today's visit Compression hose as needed  Mixed hyperlipidemia Crestor likely causing some cramping in her thighs Hard to say as she is deconditioned Recommended she start a walking program and if muscle pain gets worse she could hold her Crestor for several weeks then she cut down on the dose  Essential hypertension Blood pressure is well controlled on today's visit. No changes made to the medications.'  OSA (obstructive sleep apnea) Followed by pulmonary On CPAP  Disposition:   F/Uas needed   Total encounter time more than 60 minutes  Greater than 50% was spent in counseling and coordination of care with the patient    Orders Placed This Encounter  Procedures  . EKG 12-Lead     Signed, Esmond Plants, M.D., Ph.D. 04/11/2018   [place], [place]

## 2018-04-11 ENCOUNTER — Encounter: Payer: Self-pay | Admitting: Cardiovascular Disease

## 2018-04-11 ENCOUNTER — Ambulatory Visit (INDEPENDENT_AMBULATORY_CARE_PROVIDER_SITE_OTHER): Payer: Medicare Other | Admitting: Cardiovascular Disease

## 2018-04-11 VITALS — BP 128/80 | HR 69 | Ht 66.5 in | Wt 178.2 lb

## 2018-04-11 DIAGNOSIS — G4733 Obstructive sleep apnea (adult) (pediatric): Secondary | ICD-10-CM | POA: Diagnosis not present

## 2018-04-11 DIAGNOSIS — E782 Mixed hyperlipidemia: Secondary | ICD-10-CM

## 2018-04-11 DIAGNOSIS — R0609 Other forms of dyspnea: Secondary | ICD-10-CM | POA: Diagnosis not present

## 2018-04-11 DIAGNOSIS — R600 Localized edema: Secondary | ICD-10-CM | POA: Diagnosis not present

## 2018-04-11 DIAGNOSIS — R7303 Prediabetes: Secondary | ICD-10-CM

## 2018-04-11 DIAGNOSIS — R0600 Dyspnea, unspecified: Secondary | ICD-10-CM

## 2018-04-11 DIAGNOSIS — I10 Essential (primary) hypertension: Secondary | ICD-10-CM

## 2018-04-11 NOTE — Patient Instructions (Addendum)
Start a walking program   Medication Instructions:   No medication changes made  Labwork:  Lipid panel today  Testing/Procedures:  We will order an echocardiogram for shortness of breath  Follow-Up: It was a pleasure seeing you in the office today. Please call us if you have new issues that need to be addressed before your next appt.  [phone]  Your physician wants you to follow-up in:  As needed  If you need a refill on your cardiac medications before your next appointment, please call your pharmacy.  For educational health videos Log in to : [email or web address] Or : [email or web address], password : [email or web address]

## 2018-04-12 LAB — LIPID PANEL
Chol/HDL Ratio: 2.7 ratio (ref 0.0–4.4)
Cholesterol, Total: 156 mg/dL (ref 100–199)
HDL: 58 mg/dL (ref >39)
LDL Calculated: 84 mg/dL (ref 0–99)
Triglycerides: 70 mg/dL (ref 0–149)
VLDL Cholesterol Cal: 14 mg/dL (ref 5–40)

## 2018-04-18 ENCOUNTER — Ambulatory Visit: Payer: TRICARE For Life (TFL) | Admitting: Internal Medicine

## 2018-04-22 ENCOUNTER — Other Ambulatory Visit: Payer: Self-pay | Admitting: Family Medicine

## 2018-04-22 DIAGNOSIS — Z1231 Encounter for screening mammogram for malignant neoplasm of breast: Secondary | ICD-10-CM

## 2018-04-24 ENCOUNTER — Other Ambulatory Visit: Payer: TRICARE For Life (TFL)

## 2018-04-25 ENCOUNTER — Ambulatory Visit (INDEPENDENT_AMBULATORY_CARE_PROVIDER_SITE_OTHER): Payer: Medicare Other

## 2018-04-25 ENCOUNTER — Other Ambulatory Visit: Payer: Self-pay

## 2018-04-25 DIAGNOSIS — L400 Psoriasis vulgaris: Secondary | ICD-10-CM | POA: Diagnosis not present

## 2018-04-25 DIAGNOSIS — R0609 Other forms of dyspnea: Secondary | ICD-10-CM

## 2018-04-25 DIAGNOSIS — L900 Lichen sclerosus et atrophicus: Secondary | ICD-10-CM | POA: Diagnosis not present

## 2018-04-25 DIAGNOSIS — R0600 Dyspnea, unspecified: Secondary | ICD-10-CM

## 2018-05-01 DIAGNOSIS — L409 Psoriasis, unspecified: Secondary | ICD-10-CM | POA: Diagnosis not present

## 2018-05-01 DIAGNOSIS — M25541 Pain in joints of right hand: Secondary | ICD-10-CM | POA: Diagnosis not present

## 2018-05-01 DIAGNOSIS — Z79899 Other long term (current) drug therapy: Secondary | ICD-10-CM | POA: Diagnosis not present

## 2018-05-01 DIAGNOSIS — L4050 Arthropathic psoriasis, unspecified: Secondary | ICD-10-CM | POA: Diagnosis not present

## 2018-05-03 ENCOUNTER — Ambulatory Visit (INDEPENDENT_AMBULATORY_CARE_PROVIDER_SITE_OTHER): Payer: Medicare Other

## 2018-05-03 VITALS — BP 120/80 | HR 68 | Temp 97.8°F | Resp 14 | Ht 66.5 in | Wt 180.4 lb

## 2018-05-03 DIAGNOSIS — E2839 Other primary ovarian failure: Secondary | ICD-10-CM | POA: Diagnosis not present

## 2018-05-03 DIAGNOSIS — Z0000 Encounter for general adult medical examination without abnormal findings: Secondary | ICD-10-CM

## 2018-05-03 NOTE — Patient Instructions (Addendum)
  Alison Huffman , Thank you for taking time to come for your Medicare Wellness Visit. I appreciate your ongoing commitment to your health goals. Please review the following plan we discussed and let me know if I can assist you in the future.   Follow up as needed.    Bring a copy of your [place] and/or Living Will to be scanned into chart.  Dexa Scan ordered; follow as directed.  EMMI via mychart for weight loss tips and healthy eating.   Have a great day!  These are the goals we discussed: Goals    . Low Carb Diet    . Weight 145lbs     Increase physical activity Water aerobics twice weekly, 60 minutes Low carb diet; portion control       This is a list of the screening recommended for you and due dates:  Health Maintenance  Topic Date Due  . Flu Shot  05/02/2018  . Tetanus Vaccine  10/03/2026  . DEXA scan (bone density measurement)  Completed  . Pneumonia vaccines  Completed   Bone Densitometry Bone densitometry is an imaging test that uses a special X-ray to measure the amount of calcium and other minerals in your bones (bone density). This test is also known as a bone mineral density test or dual-energy X-ray absorptiometry (DXA). The test can measure bone density at your hip and your spine. It is similar to having a regular X-ray. You may have this test to:  Diagnose a condition that causes weak or thin bones (osteoporosis).  Predict your risk of a broken bone (fracture).  Determine how well osteoporosis treatment is working.  Tell a health care provider about:  Any allergies you have.  All medicines you are taking, including vitamins, herbs, eye drops, creams, and over-the-counter medicines.  Any problems you or family members have had with anesthetic medicines.  Any blood disorders you have.  Any surgeries you have had.  Any medical conditions you have.  Possibility of pregnancy.  Any other medical test you had within the previous 14  days that used contrast material. What are the risks? Generally, this is a safe procedure. However, problems can occur and may include the following:  This test exposes you to a very small amount of radiation.  The risks of radiation exposure may be greater to unborn children.  What happens before the procedure?  Do not take any calcium supplements for 24 hours before having the test. You can otherwise eat and drink what you usually do.  Take off all metal jewelry, eyeglasses, dental appliances, and any other metal objects. What happens during the procedure?  You may lie on an exam table. There will be an X-ray generator below you and an imaging device above you.  Other devices, such as boxes or braces, may be used to position your body properly for the scan.  You will need to lie still while the machine slowly scans your body.  The images will show up on a computer monitor. What happens after the procedure? You may need more testing at a later time. This information is not intended to replace advice given to you by your health care provider. Make sure you discuss any questions you have with your health care provider. Document Released: 10/10/2004 Document Revised: 02/24/2016 Document Reviewed: 02/26/2014 Elsevier Interactive Patient Education  2018 [employer].

## 2018-05-03 NOTE — Progress Notes (Signed)
Subjective:   Alison Huffman is a 77 y.o. female who presents for an Initial Medicare Annual Wellness Visit.  Review of Systems    No ROS.  Medicare Wellness Visit. Additional risk factors are reflected in the social history.  Cardiac Risk Factors include: advanced age (>47men, >76 women);hypertension     Objective:    Today's Vitals   05/03/18 0844  BP: 120/80  Pulse: 68  Resp: 14  Temp: 97.8 F (36.6 C)  TempSrc: Oral  SpO2: 98%  Weight: 180 lb 6.4 oz (81.8 kg)  Height: 5' 6.5" (1.689 m)   Body mass index is 28.68 kg/m.  Advanced Directives 05/03/2018  Does Patient Have a Medical Advance Directive? Yes  Type of Paramedic of [place];Living will  Does patient want to make changes to medical advance directive? No - Patient declined  Copy of [place] in Chart? No - copy requested    Current Medications (verified) Outpatient Encounter Medications as of 05/03/2018  Medication Sig  . alendronate (FOSAMAX) 35 MG tablet TAKE 1 TABLET EVERY 7 DAYS WITH A FULL GLASS OF WATER ON AN EMPTY STOMACH  . aspirin EC 81 MG tablet Take 81 mg by mouth daily.  . Cholecalciferol (VITAMIN D) 2000 units CAPS Take by mouth.  . Clobetasol Prop Emollient Base (CLOBETASOL PROPIONATE E) 0.05 % emollient cream Apply topically once a week.   . desonide (DESOWEN) 0.05 % ointment Apply 1 application topically 2 (two) times daily.  . folic acid (FOLVITE) 1 MG tablet Take 1 mg by mouth daily.  . methotrexate (RHEUMATREX) 2.5 MG tablet Take 2.5 mg by mouth. 6 Tablets once a week  . metroNIDAZOLE (METROCREAM) 0.75 % cream Apply topically 2 (two) times daily.  [name] nystatin cream (MYCOSTATIN) Apply 1 application topically 2 (two) times daily.  . ranitidine (ZANTAC) 150 MG tablet Take 150 mg by mouth as needed.   . rosuvastatin (CRESTOR) 20 MG tablet Take 1 tablet (20 mg total) by mouth daily.  . tacrolimus (PROTOPIC) 0.1 % ointment Apply topically 2 (two) times  daily.  . valsartan (DIOVAN) 80 MG tablet Take 1 tablet (80 mg total) by mouth daily.   No facility-administered encounter medications on file as of 05/03/2018.     Allergies (verified) Patient has no known allergies.   History: Past Medical History:  Diagnosis Date  . GERD (gastroesophageal reflux disease)   . Hyperlipidemia   . Hypertension   . 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 Lichen sclerosus   . Osteopenia   . Psoriasis   . Rosacea   . Seborrheic keratosis   . Sleep apnea    Past Surgical History:  Procedure Laterality Date  . APPENDECTOMY    . BREAST BIOPSY Left 1978   Family History  Problem Relation Age of Onset  . Stroke Mother   . Atrial fibrillation Mother   . Breast cancer Mother   . Stroke Father   . Breast cancer Maternal Aunt   . Breast cancer Paternal Aunt    Social History   Socioeconomic History  . Marital status: Single    Spouse name: Not on file  . Number of children: Not on file  . Years of education: Not on file  . Highest education level: Not on file  Occupational History  . Not on file  Social Needs  . Financial resource strain: Not hard at all  . Food insecurity:    Worry: Never true    Inability: Never  true  . Transportation needs:    Medical: No    Non-medical: No  Tobacco Use  . Smoking status: Never Smoker  . Smokeless tobacco: Never Used  Substance and Sexual Activity  . Alcohol use: Yes    Comment: rare  . Drug use: No  . Sexual activity: Not on file  Lifestyle  . Physical activity:    Days per week: 2 days    Minutes per session: 60 min  . Stress: Not at all  Relationships  . Social connections:    Talks on phone: Not on file    Gets together: Not on file    Attends religious service: Not on file    Active member of club or organization: Not on file    Attends meetings of clubs or organizations: Not on file    Relationship status: Not on file  Other Topics Concern  . Not on file  Social History Narrative  .  Not on file    Tobacco Counseling Counseling given: Not Answered   Clinical Intake:  Pre-visit preparation completed: Yes  Pain : No/denies pain     Nutritional Status: BMI 25 -29 Overweight Diabetes: No  How often do you need to have someone help you when you read instructions, pamphlets, or other written materials from your doctor or pharmacy?: 1 - Never  Interpreter Needed?: No      Activities of Daily Living In your present state of health, do you have any difficulty performing the following activities: 05/03/2018  Hearing? Y  Comment Hearing aid, bilateral  Vision? N  Difficulty concentrating or making decisions? N  Walking or climbing stairs? Y  Comment arthritis  Dressing or bathing? N  Doing errands, shopping? N  Preparing Food and eating ? N  Using the Toilet? N  In the past six months, have you accidently leaked urine? N  Do you have problems with loss of bowel control? N  Managing your Medications? N  Managing your Finances? N  Housekeeping or managing your Housekeeping? N  Some recent data might be hidden     Immunizations and Health Maintenance Immunization History  Administered Date(s) Administered  . Hepatitis B 01/01/1990, 01/31/1990, 07/03/1990  . Influenza-Unspecified 06/18/2017  . Pneumococcal Conjugate-13 05/06/2014  . Pneumococcal Polysaccharide-23 05/10/2005  . Tdap 09/04/2017  . Zoster 08/11/2008   Health Maintenance Due  Topic Date Due  . INFLUENZA VACCINE  05/02/2018    Patient Care Team: Leone Haven, MD as PCP - General (Family Medicine)  Indicate any recent Medical Services you may have received from other than [facility] providers in the past year (date may be approximate).     Assessment:   This is a routine wellness examination for Alison Huffman. The goal of the wellness visit is to assist the patient how to close the gaps in care and create a preventative care plan for the patient.   The roster of all physicians providing  medical care to patient is listed in the Snapshot section of the chart.  Taking fosamax and VIT D as appropriate/Osteoporosis risk reviewed.  Dexa scan ordered. Last result 06/2016. Educational material provided.   Safety issues reviewed; Lives at [facility].  Life alert, pull chords, smoke and carbon monoxide detectors in the home. No firearms in the home. Wears seatbelts when driving or riding with others. No violence in the home.  They do not have excessive sun exposure.  Discussed the need for sun protection: hats, long sleeves and the use  of sunscreen if there is significant sun exposure.  Patient is alert, normal appearance, oriented to person/place/and time. Correctly identified the president of the [place] and recalls of 3/3 words.Performs simple calculations and can read correct time from watch face. Displays appropriate judgement.  No new identified risk were noted.  No failures at ADL's or IADL's.    BMI- discussed the importance of a healthy diet, water intake and the benefits of aerobic exercise. Educational material provided.   24 hour diet recall: Regular diet  Dental- UTD.  Sleep patterns- Sleeps 6-8 hours at night.  Wakes feeling rested. CPAP in use. Notices minimal bloating after change of mouth piece with CPAP; states she is swallowing more air with the new accessory. Appointment scheduled with that facility next week to follow up on proper fitting.    Immunizations updated.   Psoriatic arthritis- followed by rheumatology; plans to begin Humira for treatment.   Hearing/Vision screen Hearing Screening Comments: Followed by [facility] Hearing aid, bilateral Vision Screening Comments: Followed by  Ophthalmologist Wears corrective lenses Last OV 2018; next exam scheduled Cataract extraction, bilateral Visual acuity not assessed per patient preference since they have regular follow up with the ophthalmologist  Dietary issues and exercise  activities discussed: Current Exercise Habits: Structured exercise class, Type of exercise: yoga(water aerobics), Time (Minutes): 60, Frequency (Times/Week): 2, Weekly Exercise (Minutes/Week): 120  Goals    . Low Carb Diet    . Weight 145lbs     Increase physical activity Water aerobics twice weekly, 60 minutes Low carb diet; portion control      Depression Screen PHQ 2/9 Scores 05/03/2018 03/02/2017  PHQ - 2 Score 0 0    Fall Risk Fall Risk  05/03/2018 03/21/2018 03/02/2017  Falls in the past year? No No No   Cognitive Function: MMSE - Mini Mental State Exam 05/03/2018  Orientation to time 5  Orientation to Place 5  Registration 3  Attention/ Calculation 5  Recall 3  Language- name 2 objects 2  Language- repeat 1  Language- follow 3 step command 3  Language- read & follow direction 1  Write a sentence 1  Copy design 1  Total score 30        Screening Tests Health Maintenance  Topic Date Due  . INFLUENZA VACCINE  05/02/2018  . TETANUS/TDAP  10/03/2026  . DEXA SCAN  Completed  . PNA vac Low Risk Adult  Completed     Plan:   End of life planning; Advance aging; Advanced directives discussed. Copy of current HCPOA/Living Will requested.    Dexa Scan ordered; follow as directed.   I have personally reviewed and noted the following in the patient's chart:   . Medical and social history . Use of alcohol, tobacco or illicit drugs  . Current medications and supplements . Functional ability and status . Nutritional status . Physical activity . Advanced directives . List of other physicians . Hospitalizations, surgeries, and ER visits in previous 12 months . Vitals . Screenings to include cognitive, depression, and falls . Referrals and appointments  In addition, I have reviewed and discussed with patient certain preventive protocols, quality metrics, and best practice recommendations. A written personalized care plan for preventive services as well as general  preventive health recommendations were provided to patient.     Varney Biles, LPN   10/08/5100

## 2018-05-12 NOTE — Progress Notes (Signed)
[facility] Consultation      Assessment and Plan:  Obstructive sleep apnea. -Known obstructive sleep apnea, diagnosed 2015. Was being treated with a dental device, has been developing progressive jaw pain and "jaw slipping", having difficulty using the device.   - Sent for split-night which showed AHI 24, started on auto CPAP with pressure range 5-15 cmH2O.  Has been doing well with that pressure, download shows residual AHI of less than 5 with good compliance.   Date: 05/12/2018  MRN# [id number] Alison Huffman 25-Jul-1941    Alison Huffman is a 77 y.o. old female seen in consultation for chief complaint of:    Chief Complaint  Patient presents with  . Follow-up  . Sleep Apnea    has bloatd feeling at times: dry mouth    HPI:   The patient is a 77 year old female (though looks considerably younger) with a history of obstructive sleep apnea, was using a dental appliance since 2014 and had been doing well with it, but more recently was starting to feel more tired during the day, and started developing jaw pain and jaw slipping.  She has been started on CPAP, she is using nasal pillows and feels that she is doing well with that. She had some initial bloating but is better now.  She cleans her supplies daily with water and weekly with soap.   She was started on methotrexate for psoriatic arthritis.    **Review of download data 03/21/2018-04/27/2018>> data personally reviewed.  This is greater than 4 hours is 25/30 days.  Average usage on days used is 7 hours 32 minutes.  Pressure ranges 5-15.  Median pressure 7, 95th percentile pressure 10, maximum pressure 11.  Residual AHI is 2.9.  Overall this shows good compliance with CPAP with excellent control of obstructive sleep apnea. **Sleep study 03/15/18>> AHI 24.(split night with titration) recommended 5-15 cm H2O.   Medication:    Current Outpatient Medications:  .  alendronate (FOSAMAX) 35 MG tablet, TAKE 1 TABLET  EVERY 7 DAYS WITH A FULL GLASS OF WATER ON AN EMPTY STOMACH, Disp: 12 tablet, Rfl: 3 .  aspirin EC 81 MG tablet, Take 81 mg by mouth daily., Disp: , Rfl:  .  Cholecalciferol (VITAMIN D) 2000 units CAPS, Take by mouth., Disp: , Rfl:  .  Clobetasol Prop Emollient Base (CLOBETASOL PROPIONATE E) 0.05 % emollient cream, Apply topically once a week. , Disp: , Rfl:  .  desonide (DESOWEN) 0.05 % ointment, Apply 1 application topically 2 (two) times daily., Disp: , Rfl:  .  folic acid (FOLVITE) 1 MG tablet, Take 1 mg by mouth daily., Disp: , Rfl:  .  methotrexate (RHEUMATREX) 2.5 MG tablet, Take 2.5 mg by mouth. 6 Tablets once a week, Disp: , Rfl:  .  metroNIDAZOLE (METROCREAM) 0.75 % cream, Apply topically 2 (two) times daily., Disp: , Rfl:  .  nystatin cream (MYCOSTATIN), Apply 1 application topically 2 (two) times daily., Disp: , Rfl:  .  ranitidine (ZANTAC) 150 MG tablet, Take 150 mg by mouth as needed. , Disp: , Rfl:  .  rosuvastatin (CRESTOR) 20 MG tablet, Take 1 tablet (20 mg total) by mouth daily., Disp: 90 tablet, Rfl: 3 .  tacrolimus (PROTOPIC) 0.1 % ointment, Apply topically 2 (two) times daily., Disp: , Rfl:  .  valsartan (DIOVAN) 80 MG tablet, Take 1 tablet (80 mg total) by mouth daily., Disp: 90 tablet, Rfl: 3   Allergies:  Patient has no known allergies.  Review of Systems:  Constitutional: Feels well. Cardiovascular: No chest pain.  Pulmonary: Denies dyspnea.   The remainder of systems were reviewed and were found to be negative other than what is documented in the HPI.    Physical Examination:   VS: BP 124/80 (BP Location: Left Arm, Cuff Size: Normal)   Pulse 74   Ht 5' 6.5" (1.689 m)   Wt 179 lb (81.2 kg)   SpO2 94%   BMI 28.46 kg/m   General Appearance: No distress  Neuro:without focal findings, mental status, speech normal, alert and oriented HEENT: PERRLA, EOM intact Pulmonary: No wheezing, No rales  CardiovascularNormal S1,S2.  No m/r/g.  Abdomen: Benign, Soft,  non-tender, No masses Renal:  No costovertebral tenderness  GU:  No performed at this time. Endoc: No evident thyromegaly, no signs of acromegaly or Cushing features Skin:   warm, no rashes, no ecchymosis  Extremities: normal, no cyanosis, clubbing.    LABORATORY PANEL:   CBC No results for input(s): WBC, HGB, HCT, PLT in the last 168 hours. ------------------------------------------------------------------------------------------------------------------  Chemistries  No results for input(s): NA, K, CL, CO2, GLUCOSE, BUN, CREATININE, CALCIUM, MG, AST, ALT, ALKPHOS, BILITOT in the last 168 hours.  Invalid input(s): GFRCGP ------------------------------------------------------------------------------------------------------------------  Cardiac Enzymes No results for input(s): TROPONINI in the last 168 hours. ------------------------------------------------------------  RADIOLOGY:  No results found.     Thank  you for the consultation and for allowing [facility] to assist in the care of your patient. Our recommendations are noted above.  Please contact us if we can be of further service.   Marda Stalker, M.D., F.C.C.P.  Board Certified in Internal Medicine, Pulmonary Medicine, [place], and Sleep Medicine.  [facility] Office Number: [phone]   05/12/2018

## 2018-05-13 ENCOUNTER — Ambulatory Visit (INDEPENDENT_AMBULATORY_CARE_PROVIDER_SITE_OTHER): Payer: Medicare Other | Admitting: Internal Medicine

## 2018-05-13 ENCOUNTER — Encounter: Payer: Self-pay | Admitting: Internal Medicine

## 2018-05-13 VITALS — BP 124/80 | HR 74 | Ht 66.5 in | Wt 179.0 lb

## 2018-05-13 DIAGNOSIS — L02229 Furuncle of trunk, unspecified: Secondary | ICD-10-CM | POA: Insufficient documentation

## 2018-05-13 DIAGNOSIS — G4733 Obstructive sleep apnea (adult) (pediatric): Secondary | ICD-10-CM | POA: Diagnosis not present

## 2018-05-13 DIAGNOSIS — L4050 Arthropathic psoriasis, unspecified: Secondary | ICD-10-CM | POA: Diagnosis not present

## 2018-05-13 DIAGNOSIS — Z79899 Other long term (current) drug therapy: Secondary | ICD-10-CM | POA: Diagnosis not present

## 2018-05-13 DIAGNOSIS — L409 Psoriasis, unspecified: Secondary | ICD-10-CM | POA: Diagnosis not present

## 2018-05-13 NOTE — Patient Instructions (Signed)
Continue using CPAP every night.  

## 2018-05-21 DIAGNOSIS — L4050 Arthropathic psoriasis, unspecified: Secondary | ICD-10-CM | POA: Diagnosis not present

## 2018-05-30 ENCOUNTER — Encounter: Payer: Self-pay | Admitting: Family Medicine

## 2018-05-30 ENCOUNTER — Ambulatory Visit
Admission: RE | Admit: 2018-05-30 | Discharge: 2018-05-30 | Disposition: A | Discharge status: Home / Self Care | Payer: Medicare Other | Source: Ambulatory Visit | Attending: Family Medicine | Admitting: Family Medicine

## 2018-05-30 DIAGNOSIS — Z1231 Encounter for screening mammogram for malignant neoplasm of breast: Secondary | ICD-10-CM | POA: Diagnosis not present

## 2018-06-20 ENCOUNTER — Telehealth: Payer: Self-pay | Admitting: Internal Medicine

## 2018-06-20 NOTE — Telephone Encounter (Signed)
Called [facility] to have them contact patient to find out what is going on. Spoke with Kenney Houseman @ [facility] and she will reach out to patient. Nothing further needed.

## 2018-06-20 NOTE — Telephone Encounter (Signed)
Alison Huffman needs to look into this. If they refuse, please notify a supervisor there.

## 2018-06-20 NOTE — Telephone Encounter (Signed)
Patient calling stating she is having issues with her CPAP machine   She is using the nasal pillows and she got a notification  It is telling her that it has a leak somewhere on her machine  She even received a e-mail from [place]  Telling her to contact us about this   Would like advise on this

## 2018-07-03 DIAGNOSIS — R0602 Shortness of breath: Secondary | ICD-10-CM | POA: Diagnosis not present

## 2018-07-03 DIAGNOSIS — R0609 Other forms of dyspnea: Secondary | ICD-10-CM | POA: Diagnosis not present

## 2018-07-03 DIAGNOSIS — Z79899 Other long term (current) drug therapy: Secondary | ICD-10-CM | POA: Diagnosis not present

## 2018-07-03 DIAGNOSIS — L4050 Arthropathic psoriasis, unspecified: Secondary | ICD-10-CM | POA: Diagnosis not present

## 2018-07-03 DIAGNOSIS — R05 Cough: Secondary | ICD-10-CM | POA: Diagnosis not present

## 2018-07-03 DIAGNOSIS — L409 Psoriasis, unspecified: Secondary | ICD-10-CM | POA: Diagnosis not present

## 2018-07-03 DIAGNOSIS — M7989 Other specified soft tissue disorders: Secondary | ICD-10-CM | POA: Diagnosis not present

## 2018-07-08 NOTE — Telephone Encounter (Signed)
Patient is calling to follow up on the bone denisity referral. It has been two weeks. And she had not heard anything. Please advise [phone]

## 2018-07-16 DIAGNOSIS — H2513 Age-related nuclear cataract, bilateral: Secondary | ICD-10-CM | POA: Diagnosis not present

## 2018-07-18 DIAGNOSIS — R899 Unspecified abnormal finding in specimens from other organs, systems and tissues: Secondary | ICD-10-CM | POA: Diagnosis not present

## 2018-07-18 DIAGNOSIS — R809 Proteinuria, unspecified: Secondary | ICD-10-CM | POA: Diagnosis not present

## 2018-07-18 DIAGNOSIS — R748 Abnormal levels of other serum enzymes: Secondary | ICD-10-CM | POA: Diagnosis not present

## 2018-07-22 ENCOUNTER — Inpatient Hospital Stay (HOSPITAL_COMMUNITY)
Admission: AD | Admit: 2018-07-22 | Discharge: 2018-07-24 | DRG: 244 | Disposition: A | Discharge status: Home / Self Care | Payer: Medicare Other | Source: Other Acute Inpatient Hospital | Attending: Cardiology | Admitting: Cardiology

## 2018-07-22 ENCOUNTER — Encounter: Payer: Self-pay | Admitting: Family

## 2018-07-22 ENCOUNTER — Other Ambulatory Visit: Payer: Self-pay

## 2018-07-22 ENCOUNTER — Encounter (HOSPITAL_COMMUNITY): Payer: Self-pay | Admitting: General Practice

## 2018-07-22 ENCOUNTER — Ambulatory Visit (INDEPENDENT_AMBULATORY_CARE_PROVIDER_SITE_OTHER): Payer: Medicare Other | Admitting: Family

## 2018-07-22 ENCOUNTER — Emergency Department
Admission: EM | Admit: 2018-07-22 | Discharge: 2018-07-22 | Disposition: A | Discharge status: Other Acute Inpatient Hospital | Payer: Medicare Other | Attending: Emergency Medicine | Admitting: Emergency Medicine

## 2018-07-22 VITALS — BP 150/68 | HR 40 | Temp 98.1°F | Resp 15 | Wt 180.2 lb

## 2018-07-22 DIAGNOSIS — E785 Hyperlipidemia, unspecified: Secondary | ICD-10-CM | POA: Diagnosis present

## 2018-07-22 DIAGNOSIS — Z79899 Other long term (current) drug therapy: Secondary | ICD-10-CM | POA: Insufficient documentation

## 2018-07-22 DIAGNOSIS — I4510 Unspecified right bundle-branch block: Secondary | ICD-10-CM | POA: Diagnosis present

## 2018-07-22 DIAGNOSIS — I441 Atrioventricular block, second degree: Secondary | ICD-10-CM | POA: Diagnosis not present

## 2018-07-22 DIAGNOSIS — Z9989 Dependence on other enabling machines and devices: Secondary | ICD-10-CM

## 2018-07-22 DIAGNOSIS — I10 Essential (primary) hypertension: Secondary | ICD-10-CM

## 2018-07-22 DIAGNOSIS — L4050 Arthropathic psoriasis, unspecified: Secondary | ICD-10-CM | POA: Diagnosis not present

## 2018-07-22 DIAGNOSIS — R001 Bradycardia, unspecified: Secondary | ICD-10-CM | POA: Diagnosis not present

## 2018-07-22 DIAGNOSIS — G4733 Obstructive sleep apnea (adult) (pediatric): Secondary | ICD-10-CM | POA: Diagnosis present

## 2018-07-22 DIAGNOSIS — Z87891 Personal history of nicotine dependence: Secondary | ICD-10-CM | POA: Diagnosis not present

## 2018-07-22 DIAGNOSIS — K219 Gastro-esophageal reflux disease without esophagitis: Secondary | ICD-10-CM | POA: Diagnosis present

## 2018-07-22 DIAGNOSIS — I872 Venous insufficiency (chronic) (peripheral): Secondary | ICD-10-CM | POA: Diagnosis not present

## 2018-07-22 DIAGNOSIS — Z7983 Long term (current) use of bisphosphonates: Secondary | ICD-10-CM

## 2018-07-22 DIAGNOSIS — Z23 Encounter for immunization: Secondary | ICD-10-CM | POA: Diagnosis not present

## 2018-07-22 DIAGNOSIS — Z7982 Long term (current) use of aspirin: Secondary | ICD-10-CM | POA: Diagnosis not present

## 2018-07-22 DIAGNOSIS — Z959 Presence of cardiac and vascular implant and graft, unspecified: Secondary | ICD-10-CM

## 2018-07-22 HISTORY — DX: Headache: R51

## 2018-07-22 HISTORY — DX: Arthropathic psoriasis, unspecified: L40.50

## 2018-07-22 HISTORY — DX: Headache, unspecified: R51.9

## 2018-07-22 HISTORY — DX: Pneumonia, unspecified organism: J18.9

## 2018-07-22 LAB — CBC
HCT: 38.8 % (ref 36.0–46.0)
HCT: 38.5 % (ref 36.0–46.0)
Hemoglobin: 12.7 g/dL (ref 12.0–15.0)
Hemoglobin: 12.1 g/dL (ref 12.0–15.0)
MCH: 31.8 pg (ref 26.0–34.0)
MCH: 30.4 pg (ref 26.0–34.0)
MCHC: 32.7 g/dL (ref 30.0–36.0)
MCHC: 31.4 g/dL (ref 30.0–36.0)
MCV: 97.2 fL (ref 80.0–100.0)
MCV: 96.7 fL (ref 80.0–100.0)
Platelets: 252 10*3/uL (ref 150–400)
Platelets: 252 10*3/uL (ref 150–400)
RBC: 3.99 MIL/uL (ref 3.87–5.11)
RBC: 3.98 MIL/uL (ref 3.87–5.11)
RDW: 13 % (ref 11.5–15.5)
RDW: 12.9 % (ref 11.5–15.5)
WBC: 6.2 10*3/uL (ref 4.0–10.5)
WBC: 6.5 10*3/uL (ref 4.0–10.5)
nRBC: 0 % (ref 0.0–0.2)
nRBC: 0 % (ref 0.0–0.2)

## 2018-07-22 LAB — COMPREHENSIVE METABOLIC PANEL
ALT: 18 U/L (ref 0–44)
AST: 17 U/L (ref 15–41)
Albumin: 4.3 g/dL (ref 3.5–5.0)
Alkaline Phosphatase: 86 U/L (ref 38–126)
Anion gap: 8 (ref 5–15)
BUN: 13 mg/dL (ref 8–23)
CO2: 24 mmol/L (ref 22–32)
Calcium: 9.2 mg/dL (ref 8.9–10.3)
Chloride: 109 mmol/L (ref 98–111)
Creatinine, Ser: 0.64 mg/dL (ref 0.44–1.00)
GFR calc Af Amer: >60 mL/min (ref >60)
GFR calc non Af Amer: >60 mL/min (ref >60)
Glucose, Bld: 95 mg/dL (ref 70–99)
Potassium: 3.8 mmol/L (ref 3.5–5.1)
Sodium: 141 mmol/L (ref 135–145)
Total Bilirubin: 0.9 mg/dL (ref 0.3–1.2)
Total Protein: 7.2 g/dL (ref 6.5–8.1)

## 2018-07-22 LAB — CREATININE, SERUM
Creatinine, Ser: 0.79 mg/dL (ref 0.44–1.00)
GFR calc Af Amer: >60 mL/min (ref >60)
GFR calc non Af Amer: >60 mL/min (ref >60)

## 2018-07-22 LAB — SURGICAL PCR SCREEN
MRSA, PCR: NEGATIVE
Staphylococcus aureus: NEGATIVE

## 2018-07-22 LAB — TROPONIN I: Troponin I: <0.03 ng/mL (ref <0.03)

## 2018-07-22 MED ORDER — ROSUVASTATIN CALCIUM 10 MG PO TABS
10.0000 mg | ORAL_TABLET | Freq: Every day | ORAL | Status: DC
  Administered 2018-07-22 – 2018-07-23 (×2): 10 mg via ORAL
  Filled 2018-07-22 (×2): qty 1

## 2018-07-22 MED ORDER — NITROGLYCERIN 0.4 MG SL SUBL: 0.4000 mg | SUBLINGUAL_TABLET | SUBLINGUAL | Status: DC | PRN

## 2018-07-22 MED ORDER — ACETAMINOPHEN 325 MG PO TABS
650.0000 mg | ORAL_TABLET | ORAL | Status: DC | PRN
  Administered 2018-07-23: 650 mg via ORAL
  Filled 2018-07-22: qty 2

## 2018-07-22 MED ORDER — HEPARIN SODIUM (PORCINE) 5000 UNIT/ML IJ SOLN
5000.0000 [IU] | Freq: Three times a day (TID) | INTRAMUSCULAR | Status: DC
  Administered 2018-07-22 – 2018-07-23 (×2): 5000 [IU] via SUBCUTANEOUS
  Filled 2018-07-22 (×2): qty 1

## 2018-07-22 MED ORDER — CHLORHEXIDINE GLUCONATE 4 % EX LIQD
60.0000 mL | Freq: Once | CUTANEOUS | Status: AC
  Administered 2018-07-23: 4 via TOPICAL
  Filled 2018-07-22: qty 60

## 2018-07-22 MED ORDER — IRBESARTAN 75 MG PO TABS
75.0000 mg | ORAL_TABLET | Freq: Every day | ORAL | Status: DC
  Administered 2018-07-23 – 2018-07-24 (×2): 75 mg via ORAL
  Filled 2018-07-22 (×3): qty 1

## 2018-07-22 MED ORDER — SODIUM CHLORIDE 0.9 % IV SOLN
INTRAVENOUS | Status: DC
  Administered 2018-07-23: 06:00:00 via INTRAVENOUS

## 2018-07-22 MED ORDER — FOLIC ACID 1 MG PO TABS
1.0000 mg | ORAL_TABLET | Freq: Every day | ORAL | Status: DC
  Administered 2018-07-23 – 2018-07-24 (×2): 1 mg via ORAL
  Filled 2018-07-22 (×3): qty 1

## 2018-07-22 MED ORDER — CEFAZOLIN SODIUM-DEXTROSE 2-4 GM/100ML-% IV SOLN
2.0000 g | INTRAVENOUS | Status: AC
  Administered 2018-07-23: 2 g via INTRAVENOUS
  Filled 2018-07-22: qty 100

## 2018-07-22 MED ORDER — CHLORHEXIDINE GLUCONATE 4 % EX LIQD
60.0000 mL | Freq: Once | CUTANEOUS | Status: AC
  Administered 2018-07-22: 4 via TOPICAL
  Filled 2018-07-22: qty 60

## 2018-07-22 MED ORDER — SODIUM CHLORIDE 0.9 % IV SOLN
80.0000 mg | INTRAVENOUS | Status: AC
  Administered 2018-07-23: 80 mg
  Filled 2018-07-22 (×2): qty 2

## 2018-07-22 MED ORDER — VITAMIN D 1000 UNITS PO TABS
2000.0000 [IU] | ORAL_TABLET | Freq: Every day | ORAL | Status: DC
  Administered 2018-07-23 – 2018-07-24 (×2): 2000 [IU] via ORAL
  Filled 2018-07-22 (×5): qty 2

## 2018-07-22 MED ORDER — ONDANSETRON HCL 4 MG/2ML IJ SOLN: 4.0000 mg | Freq: Four times a day (QID) | INTRAMUSCULAR | Status: DC | PRN

## 2018-07-22 NOTE — ED Provider Notes (Signed)
[facility] Emergency Department Provider Note  Time seen: 1:25 PM  I have reviewed the triage vital signs and the nursing notes.   HISTORY  Chief Complaint Bradycardia and Hypertension    HPI Alison Huffman is a 77 y.o. female with a past medical history of hypertension, hyperlipidemia, gastric reflux, psoriasis, presents to the emergency department for elevated blood pressure and low heart rate.  According to the patient over the weekend she had been feeling somewhat more fatigued and weak, was feeling some shortness of breath especially with exertion, she states she took her blood pressure and it was reading in the 180s and her heart rate was in the 40s.  Patient found this to be very abnormal compared to her baseline, made an appointment today with her primary care doctor for reevaluation.  According to the patient her blood pressure remained in the 180s with a heart rate in the 40s and her primary care doctor so they brought her to the emergency department for evaluation.  Patient denies any chest pain now or at any point.  No abdominal pain, nausea vomiting or diarrhea.  No dysuria.  No fever.  Largely negative review of systems otherwise.   Past Medical History:  Diagnosis Date  . GERD (gastroesophageal reflux disease)   . Hyperlipidemia   . Hypertension   . 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 Lichen sclerosus   . Osteopenia   . Psoriasis   . Rosacea   . Seborrheic keratosis   . Sleep apnea     Patient Active Problem List   Diagnosis Date Noted  . Bradycardia 07/22/2018  . Intermittent claudication ([place]) 03/21/2018  . Exertional dyspnea 03/21/2018  . Greater trochanteric bursitis of both hips 12/18/2017  . Prediabetes 09/19/2017  . Stiffness of right hand joint 08/28/2017  . Bilateral leg edema 08/28/2017  . 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 Allergic rhinitis 06/14/2017  . Acute right ankle pain 06/14/2017  . History of carotid stenosis 06/14/2017   . Psoriatic arthritis ([place]) 06/14/2017  . Lichen sclerosus   . GERD (gastroesophageal reflux disease)   . Hypertension 03/02/2017  . Hyperlipidemia 03/02/2017  . OSA (obstructive sleep apnea) 03/02/2017  . Eye exam, routine 03/02/2017  . Osteopenia 03/02/2017    Past Surgical History:  Procedure Laterality Date  . APPENDECTOMY    . BREAST BIOPSY Left 1978    Prior to Admission medications   Medication Sig Start Date End Date Taking? Authorizing Provider  alendronate (FOSAMAX) 35 MG tablet TAKE 1 TABLET EVERY 7 DAYS WITH A FULL GLASS OF WATER ON AN EMPTY STOMACH 12/18/17   Leone Haven, MD  aspirin EC 81 MG tablet Take 81 mg by mouth daily.    [provider]  Cholecalciferol (VITAMIN D) 2000 units CAPS Take by mouth.    [provider]  Clobetasol Prop Emollient Base (CLOBETASOL PROPIONATE E) 0.05 % emollient cream Apply topically once a week.     [provider]  desonide (DESOWEN) 0.05 % ointment Apply 1 application topically 2 (two) times daily.    [provider]  folic acid (FOLVITE) 1 MG tablet Take 1 mg by mouth daily. 12/10/17 12/10/18  [provider]  methotrexate (RHEUMATREX) 2.5 MG tablet Take 2.5 mg by mouth. 6 Tablets once a week 12/10/17   [provider]  metroNIDAZOLE (METROCREAM) 0.75 % cream Apply topically 2 (two) times daily.    [provider]  nystatin cream (MYCOSTATIN) Apply 1 application topically 2 (two)  times daily.    [provider]  rosuvastatin (CRESTOR) 20 MG tablet Take 20 mg by mouth daily.    [provider]  tacrolimus (PROTOPIC) 0.1 % ointment Apply topically 2 (two) times daily.    [provider]  valsartan (DIOVAN) 80 MG tablet Take 1 tablet (80 mg total) by mouth daily. 12/18/17   Leone Haven, MD    No Known Allergies  Family History  Problem Relation Age of Onset  . Stroke Mother   . Atrial fibrillation Mother   . Breast cancer Mother    . Stroke Father   . Breast cancer Maternal Aunt   . Breast cancer Paternal Aunt     Social History Social History   Tobacco Use  . Smoking status: Never Smoker  . Smokeless tobacco: Never Used  Substance Use Topics  . Alcohol use: Yes    Comment: rare  . Drug use: No    Review of Systems Constitutional: Negative for fever. ENT: Negative for recent illness/congestion Cardiovascular: Negative for chest pain. Respiratory: Mild shortness of breath with exertion. Gastrointestinal: Negative for abdominal pain, vomiting and diarrhea. Genitourinary: Negative for urinary compaints Musculoskeletal: Negative for leg pain or swelling Skin: Negative for skin complaints  Neurological: Negative for headache All other ROS negative  ____________________________________________   PHYSICAL EXAM:  VITAL SIGNS: ED Triage Vitals  Enc Vitals Group     BP 07/22/18 1309 (!) 187/76     Pulse Rate 07/22/18 1309 (!) 46     Resp 07/22/18 1309 16     Temp 07/22/18 1309 97.7 F (36.5 C)     Temp Source 07/22/18 1309 Oral     SpO2 07/22/18 1307 98 %     Weight 07/22/18 1310 179 lb 3.7 oz (81.3 kg)     Height 07/22/18 1310 5' (1.524 m)     Head Circumference --      Peak Flow --      Pain Score 07/22/18 1310 0     Pain Loc --      Pain Edu? --      Excl. in [place]? --     Constitutional: Alert and oriented. Well appearing and in no distress. Eyes: Normal exam ENT   Head: Normocephalic and atraumatic.   Mouth/Throat: Mucous membranes are moist. Cardiovascular: Regular rhythm, rate varies between 45 bpm and 60 bpm.  No obvious murmur on exam. Respiratory: Normal respiratory effort without tachypnea nor retractions. Breath sounds are clear  Gastrointestinal: Soft and nontender. No distention.   Musculoskeletal: Nontender with normal range of motion in all extremities. No lower extremity tenderness or edema. Neurologic:  Normal speech and language. No gross focal neurologic deficits   Skin:  Skin is warm, dry and intact.  Psychiatric: Mood and affect are normal.   ____________________________________________    EKG  EKG reviewed and interpreted by myself shows a sinus bradycardia 44 bpm with a slightly widened QRS, normal axis, slight QTC prolongation otherwise normal intervals.  Nonspecific but no concerning ST changes.  ____________________________________________   INITIAL IMPRESSION / ASSESSMENT AND PLAN / ED COURSE  Pertinent labs & imaging results that were available during my care of the patient were reviewed by me and considered in my medical decision making (see chart for details).  Patient presents to the emergency department for bradycardia and hypertension.  We will check labs including cardiac enzymes.  Patient's EKG and cardiac monitor is consistent with sinus bradycardia in the mid 40s.  We will discuss with  her cardiologist Dr. Rockey Situ or whoever is on-call for Dr. Rockey Situ once results are known.  Patient agreeable to plan of care.  Currently patient appears well, no distress, with no significant complaints lying in bed calm and comfortable.  Recent continues to be bradycardic at times as low as 42 bpm.  At x2 will have a heart rate as high as 60 bpm but this appears to be largely brief and will go back to heart rate in the 40s for the majority of the time.  I discussed the patient with Dr. 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  He has discussed the patient with the electrophysiology cardiologist at [facility] and they believe the patient would benefit from transfer to [facility] for possible pacemaker placement.  I discussed this with the patient she is agreeable to this plan of care.  I spoke to Dr. Marlou Porch of cardiology at [facility].  They have accepted the patient under their service.  Patient will be transferred once bed has become  available.  ____________________________________________   FINAL CLINICAL IMPRESSION(S) / ED DIAGNOSES  bradycardia hypertension    Harvest Dark, MD 07/22/18 1520

## 2018-07-22 NOTE — Assessment & Plan Note (Addendum)
Symptomatic bradycardia. 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 Prior done with Rockey Situ shows RBBB.  No acute ischemia noted. Called cardiologist office and spoke with Jule Ser whom advised ED. I also spoke with Gollan whom advised ED. Discussed with pcp who was in agreement with that plan. EMS transported patient to [facility] ED. [place] made aware from [place].

## 2018-07-22 NOTE — ED Notes (Signed)
ALL  PAPERWORK  [place]

## 2018-07-22 NOTE — ED Triage Notes (Signed)
Pt arrived via EMS from clinic after being seen for bradycardia and HTN that she noticed at home. Cardiologist sent her to the ED to be seen for new RBBB and second degree block.

## 2018-07-22 NOTE — Progress Notes (Signed)
Subjective:    Patient ID: Alison Huffman, female    DOB: [birth year], 77 y.o.   MRN: [id number]  CC: KARREN NEWLAND is a 77 y.o. female who presents today for an acute visit.    HPI: HTN- has typically had good control on diovan. Last week from 132/82. Over the past 6 days, have been taking blood pressure at home as doesn't  Normally monitor it.   Endorses fatigue 'for a long time.'   Concern with low HR. Most values in 60's. Had 40's the past 3 days. No dizziness,  Notes SOB when walks 'fast.' However feels like cannot walk as far. No lung disease. Never smoker.   HLD- had been having leg pain , stopped crestor, and pain resolved completley. Gollan advised to restart cholesterol at half the dose. and now taking 1/2 crestor  OSA- Follows with Dr Rogelia Mire  Psoriatic arthritis- had been on methotrexate, stopped after elevated liver enzymes; continues to be humira, no longer on either. Follows with rheumatology, Dr Meda Coffee.    echo 04/2018 No significant valve disease. LV EF 55-60% Follows with cardiology, Gollan.    HISTORY:  Past Medical History:  Diagnosis Date  . GERD (gastroesophageal reflux disease)   . Hyperlipidemia   . Hypertension   . 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 Lichen sclerosus   . Osteopenia   . Psoriasis   . Rosacea   . Seborrheic keratosis   . Sleep apnea    Past Surgical History:  Procedure Laterality Date  . APPENDECTOMY    . BREAST BIOPSY Left 1978   Family History  Problem Relation Age of Onset  . Stroke Mother   . Atrial fibrillation Mother   . Breast cancer Mother   . Stroke Father   . Breast cancer Maternal Aunt   . Breast cancer Paternal Aunt     Allergies: Patient has no known allergies. Current Outpatient Medications on File Prior to Visit  Medication Sig Dispense Refill  . alendronate (FOSAMAX) 35 MG tablet TAKE 1 TABLET EVERY 7 DAYS WITH A FULL GLASS OF WATER ON AN EMPTY STOMACH 12 tablet 3  . aspirin EC 81 MG tablet Take 81 mg  by mouth daily.    . Cholecalciferol (VITAMIN D) 2000 units CAPS Take by mouth.    . Clobetasol Prop Emollient Base (CLOBETASOL PROPIONATE E) 0.05 % emollient cream Apply topically once a week.     . desonide (DESOWEN) 0.05 % ointment Apply 1 application topically 2 (two) times daily.    . folic acid (FOLVITE) 1 MG tablet Take 1 mg by mouth daily.    . methotrexate (RHEUMATREX) 2.5 MG tablet Take 2.5 mg by mouth. 6 Tablets once a week    . metroNIDAZOLE (METROCREAM) 0.75 % cream Apply topically 2 (two) times daily.    [name] nystatin cream (MYCOSTATIN) Apply 1 application topically 2 (two) times daily.    . rosuvastatin (CRESTOR) 20 MG tablet Take 20 mg by mouth daily.    . tacrolimus (PROTOPIC) 0.1 % ointment Apply topically 2 (two) times daily.    . valsartan (DIOVAN) 80 MG tablet Take 1 tablet (80 mg total) by mouth daily. 90 tablet 3   No current facility-administered medications on file prior to visit.     Social History   Tobacco Use  . Smoking status: Never Smoker  . Smokeless tobacco: Never Used  Substance Use Topics  . Alcohol use: Yes    Comment: rare  . Drug use: No  Review of Systems  Constitutional: Negative for chills and fever.  Respiratory: Positive for shortness of breath. Negative for cough.   Cardiovascular: Negative for chest pain and palpitations.  Gastrointestinal: Negative for nausea and vomiting.  Neurological: Negative for dizziness.      Objective:    BP (!) 150/68 (BP Location: Left Arm, Patient Position: Sitting, Cuff Size: Normal)   Pulse (!) 40   Temp 98.1 F (36.7 C) (Oral)   Resp 15   Wt 180 lb 4 oz (81.8 kg)   SpO2 96%   BMI 28.66 kg/m    Physical Exam  Constitutional: She appears well-developed and well-nourished.  Eyes: Conjunctivae are normal.  Cardiovascular: Regular rhythm, normal heart sounds and normal pulses. Bradycardia present.  Pulmonary/Chest: Effort normal and breath sounds normal. She has no wheezes. She has no rhonchi.  She has no rales.  Neurological: She is alert.  Skin: Skin is warm and dry.  Psychiatric: She has a normal mood and affect. Her speech is normal and behavior is normal. Thought content normal.  Vitals reviewed.      Assessment & Plan:   Problem List Items Addressed This Visit      Other   Bradycardia - Primary    Symptomatic bradycardia. 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 Prior done with Rockey Situ shows RBBB.  No acute ischemia noted. Called cardiologist office and spoke with Jule Ser whom advised ED. I also spoke with Gollan whom advised ED. Discussed with pcp who was in agreement with that plan. EMS transported patient to [facility] ED. [place] made aware from [place].       Relevant Orders   EKG 12-Lead (Completed)         I am having Craig Guess. Betzler maintain her aspirin EC, Vitamin D, desonide, tacrolimus, Clobetasol Prop Emollient Base, metroNIDAZOLE, nystatin cream, methotrexate, folic acid, alendronate, valsartan, and rosuvastatin.   No orders of the defined types were placed in this encounter.   Return precautions given.   Risks, benefits, and alternatives of the medications and treatment plan prescribed today were discussed, and patient expressed understanding.   Education regarding symptom management and diagnosis given to patient on AVS.  Continue to follow with Leone Haven, MD for routine health maintenance.   Alison Huffman and I agreed with plan.   Mable Paris, FNP

## 2018-07-22 NOTE — Patient Instructions (Addendum)
EMS

## 2018-07-22 NOTE — H&P (Addendum)
Cardiology Admission History and Physical:   Patient ID: Alison Huffman MRN: [id number]; DOB: 08-19-1941   Admission date: 07/22/2018  Primary Care Provider: Leone Haven, MD Primary Cardiologist: Dr. Rockey Situ   Chief Complaint:  Bradycardia   Patient Profile:   Alison Huffman is a 77 y.o. female with hypertension, hyperlipidemia, OSA on CPAP, Psoriatec arthritis and dyspnea on exertion transferred from [place] with symptomatic bradycardia.  Seen by Dr. Rockey Situ on 04/2018 for dyspnea on exertion.  Felt symptoms more deconditioning.  Echocardiogram showed LV function of 55 to 60% with grade 1 diastolic dysfunction.  No significant valvular abnormality.  History of Present Illness:   [name] has history of fatigue since January.  She attributed this to being on methotrexate for psoriatic arthritis.  This was changed to Humira in August.  She continues to have fatigue.  This week noted hypertensive and evaluated by PCP today.  At office, she was hypertensive with bradycardic.  In ER patient noted bradycardic with second-degree heart block.  She was not on any rate control agent.  Sent to [facility] for possible pacemaker implantation.  Denies history of dizziness, syncope, orthopnea, PND, lower extremity edema or melena.  She has chronic dyspnea on exertion without chest pain.   Electrolyte and serum creatinine within normal limits.  Past Medical History:  Diagnosis Date  . GERD (gastroesophageal reflux disease)   . Hyperlipidemia   . Hypertension   . 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 Lichen sclerosus   . Osteopenia   . Psoriasis   . Rosacea   . Seborrheic keratosis   . Sleep apnea     Past Surgical History:  Procedure Laterality Date  . APPENDECTOMY    . BREAST BIOPSY Left 1978     Medications Prior to Admission: Prior to Admission medications   Medication Sig Start Date End Date Taking? Authorizing Provider  alendronate (FOSAMAX) 35 MG tablet TAKE 1  TABLET EVERY 7 DAYS WITH A FULL GLASS OF WATER ON AN EMPTY STOMACH Patient taking differently: Take 35 mg by mouth every Sunday.  12/18/17   Leone Haven, MD  Cholecalciferol (VITAMIN D3) 2000 units TABS Take 2,000 Units by mouth daily.    [provider]  Clobetasol Prop Emollient Base (CLOBETASOL PROPIONATE E) 0.05 % emollient cream Apply topically every Sunday.     [provider]  desonide (DESOWEN) 0.05 % ointment Apply 1 application topically 2 (two) times daily as needed (for psoriasis).     [provider]  folic acid (FOLVITE) 1 MG tablet Take 1 mg by mouth daily. 12/10/17 12/10/18  [provider]  HUMIRA PEN 40 MG/0.4ML PNKT Inject 0.4 mLs into the skin every 14 (fourteen) days.    [provider]  metroNIDAZOLE (METROCREAM) 0.75 % cream Apply topically 2 (two) times daily as needed (for infection).     [provider]  nystatin cream (MYCOSTATIN) Apply 1 application topically 2 (two) times daily as needed for dry skin.     [provider]  rosuvastatin (CRESTOR) 20 MG tablet Take 10 mg by mouth at bedtime.     [provider]  tacrolimus (PROTOPIC) 0.1 % ointment Apply topically 2 (two) times daily as needed (for psoriasis).     [provider]  valsartan (DIOVAN) 80 MG tablet Take 1 tablet (80 mg total) by mouth daily. 12/18/17   Leone Haven, MD     Allergies:   No Known Allergies  Social History:  Social History   Socioeconomic History  . Marital status: Single    Spouse name: Not on file  . Number of children: Not on file  . Years of education: Not on file  . Highest education level: Not on file  Occupational History  . Not on file  Social Needs  . Financial resource strain: Not hard at all  . Food insecurity:    Worry: Never true    Inability: Never true  . Transportation needs:    Medical: No    Non-medical: No  Tobacco Use  . Smoking status: Never Smoker  . Smokeless  tobacco: Never Used  Substance and Sexual Activity  . Alcohol use: Yes    Comment: rare  . Drug use: No  . Sexual activity: Not on file  Lifestyle  . Physical activity:    Days per week: 2 days    Minutes per session: 60 min  . Stress: Not at all  Relationships  . Social connections:    Talks on phone: Not on file    Gets together: Not on file    Attends religious service: Not on file    Active member of club or organization: Not on file    Attends meetings of clubs or organizations: Not on file    Relationship status: Not on file  . Intimate partner violence:    Fear of current or ex partner: Not on file    Emotionally abused: Not on file    Physically abused: Not on file    Forced sexual activity: Not on file  Other Topics Concern  . Not on file  Social History Narrative  . Not on file    Family History:   The patient's family history includes Atrial fibrillation in her mother; Breast cancer in her maternal aunt, mother, and paternal aunt; Stroke in her father and mother.    ROS:  Please see the history of present illness.  All other ROS reviewed and negative.     Physical Exam/Data:   Vitals:   07/22/18 1800  BP: (!) 163/45  Pulse: (!) 43  Resp: 17  Temp: 98.4 F (36.9 C)  TempSrc: Oral  SpO2: 92%  Weight: 79.7 kg  Height: 5\' 6"  (1.676 m)   No intake or output data in the 24 hours ending 07/22/18 1846 Filed Weights   07/22/18 1800  Weight: 79.7 kg   Body mass index is 28.34 kg/m.  General:  Well nourished, well developed, in no acute distress HEENT: normal Lymph: no adenopathy Neck: no  JVD Endocrine:  No thryomegaly Vascular: No carotid bruits; FA pulses 2+ bilaterally without bruits  Cardiac:  normal S1, S2; RRR; no murmur Lungs:  clear to auscultation bilaterally, no wheezing, rhonchi or rales  Abd: soft, nontender, no hepatomegaly  Ext: no edema Musculoskeletal:  No deformities, BUE and BLE strength normal and equal Skin: warm and dry    Neuro:  CNs 2-12 intact, no focal abnormalities noted Psych:  Normal affect    Relevant CV Studies:  Echo 04/25/18 Study Conclusions  - Left ventricle: The cavity size was normal. Systolic function was   normal. The estimated ejection fraction was in the range of 55%   to 60%. Wall motion was normal; there were no regional wall   motion abnormalities. Doppler parameters are consistent with   abnormal left ventricular relaxation (grade 1 diastolic   dysfunction). - Aortic valve: There was trivial regurgitation. - Mitral valve: There was mild regurgitation. - Left atrium:  The atrium was moderately dilated. - Right ventricle: Systolic function was normal. - Pulmonary arteries: Systolic pressure was within the normal   range. - Inferior vena cava: The vessel was normal in size. The   respirophasic diameter changes were in the normal range (>= 50%),   consistent with normal central venous pressure.  Impressions:  - Frequent APCs noted.  Laboratory Data:  Chemistry Recent Labs  Lab 07/22/18 1328  NA 141  K 3.8  CL 109  CO2 24  GLUCOSE 95  BUN 13  CREATININE 0.64  CALCIUM 9.2  GFRNONAA >60  GFRAA >60  ANIONGAP 8    Recent Labs  Lab 07/22/18 1328  PROT 7.2  ALBUMIN 4.3  AST 17  ALT 18  ALKPHOS 86  BILITOT 0.9   Hematology Recent Labs  Lab 07/22/18 1328  WBC 6.2  RBC 3.99  HGB 12.7  HCT 38.8  MCV 97.2  MCH 31.8  MCHC 32.7  RDW 13.0  PLT 252   Cardiac Enzymes Recent Labs  Lab 07/22/18 1328  TROPONINI <0.03   Radiology/Studies:  No results found.  Assessment and Plan:   1. Symptomatic bradycardia Patient with history of fatigue for greater than 6 months.  She has dyspnea on exertion of chest pain.  Her methotrexate changed to Humira because of elevated LFT and anemia.  Improved.  Recently elevated blood pressure.  Now presenting with bradycardia in 40s with second-degree heart block.  She is on not any rate control agent.  N.p.o. after  midnight for possible pacemaker.  EP to see in the morning.  2.  Hypertension -Home Diovan  Severity of Illness: The appropriate patient status for this patient is OBSERVATION. Observation status is judged to be reasonable and necessary in order to provide the required intensity of service to ensure the patient's safety. The patient's presenting symptoms, physical exam findings, and initial radiographic and laboratory data in the context of their medical condition is felt to place them at decreased risk for further clinical deterioration. Furthermore, it is anticipated that the patient will be medically stable for discharge from the hospital within 2 midnights of admission. The following factors support the patient status of observation.   " The patient's presenting symptoms include  Fatigue. " The physical exam findings include none " The initial radiographic and laboratory data are bradycardia    For questions or updates, please contact [place] Please consult [email or web address] for contact info under        Jarrett Soho, PA  07/22/2018 6:46 PM   Personally seen and examined. Agree with above.  77 year old with symptomatic bradycardia, 2-1 AV node conduction, high degree AV block transferred from [place].  Symptoms include fatigue, shortness of breath with activity.  Denies syncope.  Denies orthopnea.  Echocardiogram 04/2018 shows EF 60% with grade 1 diastolic dysfunction.  Currently she is resting comfortably.  Denies any chest discomfort.  Denies any recent tick bites.  Denies history of lupus although she does have a diagnosis of psoriatic arthritis.  GEN: Well nourished, well developed, in no acute distress  HEENT: normal  Neck: no JVD, carotid bruits, or masses Cardiac: Bradycardic, group beating at times, no murmurs, rubs, or gallops,no edema  Respiratory:  clear to auscultation bilaterally, normal work of breathing GI: soft, nontender, nondistended, + BS MS:  no deformity or atrophy  Skin: warm and dry, no rash Neuro:  Alert and Oriented x 3, Strength and sensation are intact Psych: euthymic mood, full affect  Labs: Personally reviewed-troponin  negative, hemoglobin 12.7 creatinine 0.64, TSH on 03/21/2018 1.06.  Echocardiogram 04/2018-EF 60%.  Assessment and plan:  Symptomatic bradycardia/high degree AV block/second-degree heart block - On no AV nodal blocking agents.  Having fatigue with exertional activity.  Heart rate 40.  We will have EP evaluate in the morning for possible pacemaker placement.  Discussed procedure with her.  Recent echocardiogram with normal EF.  TSH normal just a few months ago.  We will make n.p.o. past midnight.  Essential hypertension -Continue with home Diovan.  She is not on any AV nodal blocking agents  Candee Furbish, MD

## 2018-07-23 ENCOUNTER — Encounter (HOSPITAL_COMMUNITY)
Admission: AD | Disposition: A | Discharge status: Home / Self Care | Payer: Self-pay | Source: Other Acute Inpatient Hospital | Attending: Cardiology

## 2018-07-23 ENCOUNTER — Ambulatory Visit (HOSPITAL_COMMUNITY): Admission: RE | Admit: 2018-07-23 | Payer: Medicare Other | Source: Ambulatory Visit | Admitting: Internal Medicine

## 2018-07-23 DIAGNOSIS — Z950 Presence of cardiac pacemaker: Secondary | ICD-10-CM

## 2018-07-23 DIAGNOSIS — K219 Gastro-esophageal reflux disease without esophagitis: Secondary | ICD-10-CM | POA: Diagnosis not present

## 2018-07-23 DIAGNOSIS — G4733 Obstructive sleep apnea (adult) (pediatric): Secondary | ICD-10-CM | POA: Diagnosis not present

## 2018-07-23 DIAGNOSIS — E785 Hyperlipidemia, unspecified: Secondary | ICD-10-CM | POA: Diagnosis not present

## 2018-07-23 DIAGNOSIS — Z79899 Other long term (current) drug therapy: Secondary | ICD-10-CM | POA: Diagnosis not present

## 2018-07-23 DIAGNOSIS — Z87891 Personal history of nicotine dependence: Secondary | ICD-10-CM | POA: Diagnosis not present

## 2018-07-23 DIAGNOSIS — I10 Essential (primary) hypertension: Secondary | ICD-10-CM | POA: Diagnosis not present

## 2018-07-23 DIAGNOSIS — Z7983 Long term (current) use of bisphosphonates: Secondary | ICD-10-CM | POA: Diagnosis not present

## 2018-07-23 DIAGNOSIS — I872 Venous insufficiency (chronic) (peripheral): Secondary | ICD-10-CM | POA: Diagnosis not present

## 2018-07-23 DIAGNOSIS — I441 Atrioventricular block, second degree: Secondary | ICD-10-CM | POA: Diagnosis not present

## 2018-07-23 DIAGNOSIS — I4510 Unspecified right bundle-branch block: Secondary | ICD-10-CM | POA: Diagnosis not present

## 2018-07-23 DIAGNOSIS — L4050 Arthropathic psoriasis, unspecified: Secondary | ICD-10-CM | POA: Diagnosis not present

## 2018-07-23 DIAGNOSIS — Z9989 Dependence on other enabling machines and devices: Secondary | ICD-10-CM | POA: Diagnosis not present

## 2018-07-23 DIAGNOSIS — R001 Bradycardia, unspecified: Secondary | ICD-10-CM | POA: Diagnosis not present

## 2018-07-23 HISTORY — DX: Presence of cardiac pacemaker: Z95.0

## 2018-07-23 HISTORY — PX: PACEMAKER IMPLANT: EP1218

## 2018-07-23 SURGERY — PACEMAKER IMPLANT

## 2018-07-23 MED ORDER — LIDOCAINE HCL (PF) 1 % IJ SOLN
INTRAMUSCULAR | Status: DC | PRN
  Administered 2018-07-23: 55 mL

## 2018-07-23 MED ORDER — SODIUM CHLORIDE 0.9% FLUSH
3.0000 mL | Freq: Two times a day (BID) | INTRAVENOUS | Status: DC
  Administered 2018-07-24: 3 mL via INTRAVENOUS

## 2018-07-23 MED ORDER — SODIUM CHLORIDE 0.9 % IV SOLN
INTRAVENOUS | Status: AC
  Filled 2018-07-23: qty 2

## 2018-07-23 MED ORDER — IOPAMIDOL (ISOVUE-370) INJECTION 76%
INTRAVENOUS | Status: DC | PRN
  Administered 2018-07-23: 15 mL via INTRAVENOUS

## 2018-07-23 MED ORDER — HEPARIN (PORCINE) IN NACL 2-0.9 UNITS/ML
INTRAMUSCULAR | Status: AC | PRN
  Administered 2018-07-23: 500 mL

## 2018-07-23 MED ORDER — SODIUM CHLORIDE 0.9 % IV SOLN: 250.0000 mL | INTRAVENOUS | Status: DC | PRN

## 2018-07-23 MED ORDER — HYDROCODONE-ACETAMINOPHEN 5-325 MG PO TABS: 1.0000 | ORAL_TABLET | ORAL | Status: DC | PRN

## 2018-07-23 MED ORDER — ONDANSETRON HCL 4 MG/2ML IJ SOLN: 4.0000 mg | Freq: Four times a day (QID) | INTRAMUSCULAR | Status: DC | PRN

## 2018-07-23 MED ORDER — CEFAZOLIN SODIUM-DEXTROSE 2-4 GM/100ML-% IV SOLN
INTRAVENOUS | Status: AC
  Filled 2018-07-23: qty 100

## 2018-07-23 MED ORDER — CEFAZOLIN SODIUM-DEXTROSE 1-4 GM/50ML-% IV SOLN
1.0000 g | Freq: Four times a day (QID) | INTRAVENOUS | Status: AC
  Administered 2018-07-23 – 2018-07-24 (×3): 1 g via INTRAVENOUS
  Filled 2018-07-23 (×3): qty 50

## 2018-07-23 MED ORDER — SODIUM CHLORIDE 0.9% FLUSH: 3.0000 mL | INTRAVENOUS | Status: DC | PRN

## 2018-07-23 MED ORDER — ACETAMINOPHEN 325 MG PO TABS
325.0000 mg | ORAL_TABLET | ORAL | Status: DC | PRN
  Administered 2018-07-23 – 2018-07-24 (×2): 650 mg via ORAL
  Filled 2018-07-23 (×2): qty 2

## 2018-07-23 MED ORDER — HEPARIN (PORCINE) IN NACL 1000-0.9 UT/500ML-% IV SOLN
INTRAVENOUS | Status: DC | PRN
  Administered 2018-07-23: 500 mL

## 2018-07-23 SURGICAL SUPPLY — 7 items
CABLE SURGICAL S-101-97-12 (CABLE) ×2 IMPLANT
LEAD TENDRIL MRI 46CM LPA1200M (Lead) ×2 IMPLANT
LEAD TENDRIL MRI 58CM LPA1200M (Lead) ×2 IMPLANT
PACEMAKER ASSURITY DR-RF (Pacemaker) ×2 IMPLANT
PAD PRO RADIOLUCENT 2001M-C (PAD) ×2 IMPLANT
SHEATH CLASSIC 8F (SHEATH) ×4 IMPLANT
TRAY PACEMAKER INSERTION (PACKS) ×2 IMPLANT

## 2018-07-23 NOTE — Plan of Care (Signed)
  Problem: Education: Goal: Knowledge of General Education information will improve Description Including pain rating scale, medication(s)/side effects and non-pharmacologic comfort measures Outcome: Progressing   

## 2018-07-23 NOTE — Plan of Care (Signed)
  Problem: Cardiac: Goal: Ability to achieve and maintain adequate cardiopulmonary perfusion will improve Outcome: Progressing   

## 2018-07-23 NOTE — H&P (View-Only) (Signed)
Cardiology Consultation:   Patient ID: Alison Huffman MRN: [id number]; DOB: [birth year]  Admit date: 07/22/2018 Date of Consult: 07/23/2018  Primary Care Provider: Leone Haven, MD Primary Cardiologist: Dr. Rockey Situ Primary Electrophysiologist:  None    Patient Profile:   Alison Huffman is a 77 y.o. female with a hx of RBBB, HTN, HLD, OSA w/CPAP, psoriatic arthritis, venous insufficiency, who is being seen today for the evaluation of advanced heart block at the request of Dr. Marlou Porch.  History of Present Illness:   [name] sought attention at [facility] for c/o DOE, there she was observed to be in 2:1 AVBlock and transferred to [facility] for further care and management.  The patient a few months ago was referred to Dr. Rockey Situ for SOB with exertion, at that visit EKG was SR w/RBBB, planned for echo and suspected her exertional intolerance was 2/2 sedentary life style.  Echo noted LVEF 55-60%, grade I DD, no significant VHD, PACs.  The patient tells me for a few months she has felt fatigued and then [place] the beach getting winded easily, reporting to having been walking for exercise with good exertional capacity prior to that.  Her PMD and Rheumatologist adjusted meds such as her crestor and methotrexate and always feeling better for a few weeks, then fatigued again.  The few days leading to her admission more fatigued even at rest, no energy, never had any kind of CP, palpitations, no dizzy spells, no near syncope or syncope.  She started to keep an eye on her BP noting it unusually high and her HR 40's.  This persisted, she went to the PMD office, noted to be in 2:1 AVBlock and referred to the ER.   LABS K+ 3.8 BUN/Creat 13/0.79 WBC 6.5 H/H 12/38 Plts 252  (03/21/18) TSH 1.06  Home meds reviewed, no potential  HR limiting/nodal blocking agents  Past Medical History:  Diagnosis Date  . GERD (gastroesophageal reflux disease)   . Headache    "dull one sometimes daily, at least  weekly in last couple months" (07/22/2018)  . Hyperlipidemia   . Hypertension   . 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 Lichen sclerosus   . Osteopenia   . Pneumonia 2012? X 1  . Psoriasis   . Psoriatic arthritis ([place])    " dx'd the 1st of this year, 2019" (07/22/2018)  . Rosacea   . Seborrheic keratosis   . Sleep apnea    "using nasal pilllows since ~ 12/2017" (07/22/2018)    Past Surgical History:  Procedure Laterality Date  . APPENDECTOMY    . BREAST BIOPSY Left 1978   "benign"  . BREAST BIOPSY Bilateral    "several; all benign" (07/22/2018)     Home Medications:  Prior to Admission medications   Medication Sig Start Date End Date Taking? Authorizing Provider  acetaminophen (TYLENOL) 325 MG tablet Take by mouth every 6 (six) hours as needed for mild pain or headache.   Yes [provider]  alendronate (FOSAMAX) 35 MG tablet TAKE 1 TABLET EVERY 7 DAYS WITH A FULL GLASS OF WATER ON AN EMPTY STOMACH Patient taking differently: Take 35 mg by mouth every Sunday.  12/18/17  Yes Leone Haven, MD  calcium carbonate (TUMS EX) 750 MG chewable tablet Chew 2 tablets by mouth as needed for heartburn.   Yes [provider]  Cholecalciferol (VITAMIN D3) 2000 units TABS Take 2,000 Units by mouth daily.   Yes [provider]  Clobetasol Prop Emollient Base (  CLOBETASOL PROPIONATE E) 0.05 % emollient cream Apply topically every Sunday.    Yes [provider]  desonide (DESOWEN) 0.05 % ointment Apply 1 application topically 2 (two) times daily as needed (for psoriasis).    Yes [provider]  folic acid (FOLVITE) 1 MG tablet Take 1 mg by mouth daily. 12/10/17 12/10/18 Yes [provider]  HUMIRA PEN 40 MG/0.4ML PNKT Inject 0.4 mLs into the skin every 14 (fourteen) days.   Yes [provider]  metroNIDAZOLE (METROCREAM) 0.75 % cream Apply topically 2 (two) times daily as needed (for infection).    Yes [provider]  nystatin  cream (MYCOSTATIN) Apply 1 application topically 2 (two) times daily as needed for dry skin.    Yes [provider]  rosuvastatin (CRESTOR) 20 MG tablet Take 10 mg by mouth at bedtime.    Yes [provider]  tacrolimus (PROTOPIC) 0.1 % ointment Apply topically 2 (two) times daily as needed (for psoriasis).    Yes [provider]  valsartan (DIOVAN) 80 MG tablet Take 1 tablet (80 mg total) by mouth daily. 12/18/17  Yes Leone Haven, MD    Inpatient Medications: Scheduled Meds: . cholecalciferol  2,000 Units Oral Daily  . folic acid  1 mg Oral Daily  . gentamicin irrigation  80 mg Irrigation On Call  . heparin  5,000 Units Subcutaneous Q8H  . irbesartan  75 mg Oral Daily  . rosuvastatin  10 mg Oral QHS   Continuous Infusions: . sodium chloride 50 mL/hr at 07/23/18 0608  .  ceFAZolin (ANCEF) IV     PRN Meds: acetaminophen, nitroGLYCERIN, ondansetron (ZOFRAN) IV  Allergies:   No Known Allergies  Social History:   Social History   Socioeconomic History  . Marital status: Single    Spouse name: Not on file  . Number of children: Not on file  . Years of education: Not on file  . Highest education level: Not on file  Occupational History  . Not on file  Social Needs  . Financial resource strain: Not hard at all  . Food insecurity:    Worry: Never true    Inability: Never true  . Transportation needs:    Medical: No    Non-medical: No  Tobacco Use  . Smoking status: Former Research scientist (life sciences)  . Smokeless tobacco: Never Used  . Tobacco comment: 07/22/2018 "smoked some in college; 1960s;  nothing since"  Substance and Sexual Activity  . Alcohol use: Yes    Comment: 07/22/2018 "nothing since 11/2017; did drink 1 glass of wine/wk before that"  . Drug use: Never  . Sexual activity: Not Currently  Lifestyle  . Physical activity:    Days per week: 2 days    Minutes per session: 60 min  . Stress: Not at all  Relationships  . Social connections:    Talks  on phone: Not on file    Gets together: Not on file    Attends religious service: Not on file    Active member of club or organization: Not on file    Attends meetings of clubs or organizations: Not on file    Relationship status: Not on file  . Intimate partner violence:    Fear of current or ex partner: Not on file    Emotionally abused: Not on file    Physically abused: Not on file    Forced sexual activity: Not on file  Other Topics Concern  . Not on file  Social History  Narrative  . Not on file    Family History:   Family History  Problem Relation Age of Onset  . Stroke Mother   . Atrial fibrillation Mother   . Breast cancer Mother   . Stroke Father   . Breast cancer Maternal Aunt   . Breast cancer Paternal Aunt      ROS:  Please see the history of present illness.  All other ROS reviewed and negative.     Physical Exam/Data:   Vitals:   07/22/18 2014 07/22/18 2100 07/23/18 0508 07/23/18 0900  BP: (!) 143/49  (!) 148/42 (!) 149/48  Pulse: (!) 47 (!) 44 (!) 36 (!) 36  Resp: 16 16    Temp:   98.1 F (36.7 C)   TempSrc: Oral  Oral   SpO2: 90% 96% 97% 100%  Weight:   80.6 kg   Height:        Intake/Output Summary (Last 24 hours) at 07/23/2018 1057 Last data filed at 07/23/2018 2725 Gross per 24 hour  Intake 240 ml  Output 300 ml  Net -60 ml   Filed Weights   07/22/18 1800 07/23/18 0508  Weight: 79.7 kg 80.6 kg   Body mass index is 28.68 kg/m.  General:  Well nourished, well developed, in no acute distress HEENT: normal Lymph: no adenopathy Neck: no JVD Endocrine:  No thryomegaly Vascular: No carotid bruits  Cardiac:  RRR; bradycardic, no murmurs, gallops or rubs Lungs:  CTA b/l, no wheezing, rhonchi or rales  Abd: soft, nontender, no hepatomegaly  Ext: no edema Musculoskeletal:  No deformities Skin: warm and dry  Neuro:  no focal abnormalities noted Psych:  Normal affect   EKG:  The EKG was personally reviewed and demonstrates:   2:1  AVBlock, RBBB, 44bpm Telemetry:  Telemetry was personally reviewed and demonstrates:   2:1 heart block persistently, V rates 30's  Relevant CV Studies:  04/25/18: TTE Study Conclusions - Left ventricle: The cavity size was normal. Systolic function was   normal. The estimated ejection fraction was in the range of 55%   to 60%. Wall motion was normal; there were no regional wall   motion abnormalities. Doppler parameters are consistent with   abnormal left ventricular relaxation (grade 1 diastolic   dysfunction). - Aortic valve: There was trivial regurgitation. - Mitral valve: There was mild regurgitation. - Left atrium: The atrium was moderately dilated. - Right ventricle: Systolic function was normal. - Pulmonary arteries: Systolic pressure was within the normal   range. - Inferior vena cava: The vessel was normal in size. The   respirophasic diameter changes were in the normal range (>= 50%),   consistent with normal central venous pressure. Impressions: - Frequent APCs noted.  Laboratory Data:  Chemistry Recent Labs  Lab 07/22/18 1328 07/22/18 2149  NA 141  --   K 3.8  --   CL 109  --   CO2 24  --   GLUCOSE 95  --   BUN 13  --   CREATININE 0.64 0.79  CALCIUM 9.2  --   GFRNONAA >60 >60  GFRAA >60 >60  ANIONGAP 8  --     Recent Labs  Lab 07/22/18 1328  PROT 7.2  ALBUMIN 4.3  AST 17  ALT 18  ALKPHOS 86  BILITOT 0.9   Hematology Recent Labs  Lab 07/22/18 1328 07/22/18 2149  WBC 6.2 6.5  RBC 3.99 3.98  HGB 12.7 12.1  HCT 38.8 38.5  MCV 97.2 96.7  MCH 31.8  30.4  MCHC 32.7 31.4  RDW 13.0 12.9  PLT 252 252   Cardiac Enzymes Recent Labs  Lab 07/22/18 1328  TROPONINI <0.03   No results for input(s): TROPIPOC in the last 168 hours.  BNPNo results for input(s): BNP, PROBNP in the last 168 hours.  DDimer No results for input(s): DDIMER in the last 168 hours.  Radiology/Studies:  No results found.  Assessment and Plan:   1. Advanced heart block,  Mobitz II, V rates persistently 30's     Symptomatic bradycardia     No reversible causes are noted     RBBB at baseline     BP stable  Agree with PPM implant, discussed with the patient the recommendation, and rational.  Discussed the PPM implant procedure, potential risks and benefits.  She is agreeable to proceed.  Dr. Rayann Heman will see later today   For questions or updates, please contact [facility] Please consult [email or web address] for contact info under     Signed, Baldwin Jamaica, PA-C  07/23/2018 10:57 AM   I have seen, examined the patient, and reviewed the above assessment and plan.  Changes to above are made where necessary.  On exam, bradycardic regular rhythm.  The patient has symptomatic AV block with chronic degenerative changes by ekg.  I would therefore recommend pacemaker implantation at this time.  Risks, benefits, alternatives to pacemaker implantation were discussed in detail with the patient today. The patient understands that the risks include but are not limited to bleeding, infection, pneumothorax, perforation, tamponade, vascular damage, renal failure, MI, stroke, death,  and lead dislodgement and wishes to proceed.     Co Sign: Thompson Grayer, MD 07/23/2018 12:25 PM

## 2018-07-23 NOTE — Discharge Summary (Addendum)
DISCHARGE SUMMARY    Patient ID: Alison Huffman,  MRN: [id number], DOB/AGE: [birth year] 78 y.o.  Admit date: 07/22/2018 Discharge date: 07/24/18  Primary Care Physician: Leone Haven, MD  Primary Cardiologist: Dr. Rockey Situ Electrophysiologist: Dr. Rayann Heman (new)  Primary Discharge Diagnosis:  1. Advanced heart block 2. Symptomatic bradycardia  Secondary Discharge Diagnosis:  1. HTN 2. OSA w/CPAP 3. RBBB  No Known Allergies   Procedures This Admission:  1.  Implantation of a SJM dual chamber PPM on 07/23/18 by Dr Rayann Heman.  The patient received a [place] MRI model [id number] (serial number  [id number]) pacemaker, [employer] Tendril MRI model [id number]- 46 (serial number  [id number]) right atrial lead and a [employer] Tendril MRI model [id number]  (serial number  [id number]) right ventricular lead  There were no immediate post procedure complications. 2.  CXR on 07/24/18 demonstrated no pneumothorax status post device implantation.   Brief HPI: Alison Huffman is a 77 y.o. female with a hx of RBBB, HTN, HLD, OSA w/CPAP, psoriatic arthritis, venous insufficiency. The patient sought attention at her PMD for c/o DOE, there she was observed to be in 2:1 AVBlock and referred to the ER at [facility], once there transferred to [facility] for further care and management including EP evaluation and possible pacer.  Hospital Course:  The patient was admitted labs unremarkable, no reversible causes for her heart block were found and recommended PPM implant.  She underwent implantation of a PPM with details as outlined above.  She was monitored on telemetry overnight which demonstrated SR, V paced.  Left chest was without hematoma or ecchymosis.  The device was interrogated and found to be functioning normally.  CXR was obtained and demonstrated no pneumothorax status post device implantation.  Wound care, arm mobility, and restrictions were reviewed with the patient.  The patient  feels well, no CP, SOB, with minimal site discomfort, she was examined by Dr. Rayann Heman and considered stable for discharge to home.    Physical Exam: Vitals:   07/23/18 1609 07/23/18 2037 07/24/18 0323 07/24/18 0500  BP: (!) 149/72 (!) 139/58 130/86   Pulse: 69 70 84   Resp: 15     Temp:  98.5 F (36.9 C) 98 F (36.7 C)   TempSrc:  Oral Oral   SpO2: 92% 90% 93%   Weight:    81.1 kg  Height:        GEN- The patient is well appearing, alert and oriented x 3 today.   HEENT: normocephalic, atraumatic; sclera clear, conjunctiva pink; hearing intact; oropharynx clear; neck supple, no JVP Lungs- CTA b/l, normal work of breathing.  No wheezes, rales, rhonchi Heart- RRR, no murmurs, rubs or gallops, PMI not laterally displaced GI- soft, non-tender, non-distended Extremities- no clubbing, cyanosis, or edema MS- no significant deformity or atrophy Skin- warm and dry, no rash or lesion,  left chest without hematoma/ecchymosis Psych- euthymic mood, full affect Neuro- no gross deficits   Labs:   Lab Results  Component Value Date   WBC 6.5 07/22/2018   HGB 12.1 07/22/2018   HCT 38.5 07/22/2018   MCV 96.7 07/22/2018   PLT 252 07/22/2018    Recent Labs  Lab 07/22/18 1328  07/24/18 0401  NA 141  --  141  K 3.8  --  3.8  CL 109  --  111  CO2 24  --  21*  BUN 13  --  11  CREATININE 0.64   < >  0.67  CALCIUM 9.2  --  8.9  PROT 7.2  --   --   BILITOT 0.9  --   --   ALKPHOS 86  --   --   ALT 18  --   --   AST 17  --   --   GLUCOSE 95  --  91   < > = values in this interval not displayed.    Discharge Medications:  Allergies as of 07/24/2018   No Known Allergies     Medication List    TAKE these medications   acetaminophen 325 MG tablet Commonly known as:  TYLENOL Take by mouth every 6 (six) hours as needed for mild pain or headache.   alendronate 35 MG tablet Commonly known as:  FOSAMAX TAKE 1 TABLET EVERY 7 DAYS WITH A FULL GLASS OF WATER ON AN EMPTY STOMACH What  changed:    how much to take  how to take this  when to take this  additional instructions   calcium carbonate 750 MG chewable tablet Commonly known as:  TUMS EX Chew 2 tablets by mouth as needed for heartburn.   CLOBETASOL PROPIONATE E 0.05 % emollient cream Generic drug:  Clobetasol Prop Emollient Base Apply topically every Sunday.   desonide 0.05 % ointment Commonly known as:  DESOWEN Apply 1 application topically 2 (two) times daily as needed (for psoriasis).   folic acid 1 MG tablet Commonly known as:  FOLVITE Take 1 mg by mouth daily.   HUMIRA PEN 40 MG/0.4ML Pnkt Generic drug:  Adalimumab Inject 0.4 mLs into the skin every 14 (fourteen) days.   metroNIDAZOLE 0.75 % cream Commonly known as:  METROCREAM Apply topically 2 (two) times daily as needed (for infection).   nystatin cream Commonly known as:  MYCOSTATIN Apply 1 application topically 2 (two) times daily as needed for dry skin.   rosuvastatin 20 MG tablet Commonly known as:  CRESTOR Take 10 mg by mouth at bedtime.   tacrolimus 0.1 % ointment Commonly known as:  PROTOPIC Apply topically 2 (two) times daily as needed (for psoriasis).   valsartan 80 MG tablet Commonly known as:  DIOVAN Take 1 tablet (80 mg total) by mouth daily.   Vitamin D3 2000 units Tabs Take 2,000 Units by mouth daily.       Disposition:  Home   Follow-up Information    [place] [facility] Follow up on 08/06/2018.   Specialty:  Cardiology Why:  10:00AM, wound check visit Contact information: [address], [place]       Thompson Grayer, MD Follow up on 10/30/2018.   Specialty:  Cardiology Why:  1:45PM Contact information: [place] [address] [phone]           Duration of Discharge Encounter: Greater than 30 minutes including physician time.  Signed, Tommye Standard, PA-C 07/24/2018 9:06 AM   I have seen,  examined the patient, and reviewed the above assessment and plan.  Changes to above are made where necessary.  On exam, RRR.  Doing well s/p PPM.  Device interrogation is reviewed and normal.  CXR reveals stable leads, no ptx.  Routine wound care and follow-up  Co Sign: Thompson Grayer, MD 07/24/2018 9:11 PM

## 2018-07-23 NOTE — Consult Note (Addendum)
Cardiology Consultation:   Patient ID: CAMPBELL AGRAMONTE MRN: [id number]; DOB: 12/09/1940  Admit date: 07/22/2018 Date of Consult: 07/23/2018  Primary Care Provider: Leone Haven, MD Primary Cardiologist: Dr. Rockey Situ Primary Electrophysiologist:  None    Patient Profile:   Alison Huffman is a 77 y.o. female with a hx of RBBB, HTN, HLD, OSA w/CPAP, psoriatic arthritis, venous insufficiency, who is being seen today for the evaluation of advanced heart block at the request of Dr. Marlou Porch.  History of Present Illness:   Ms. Sermeno sought attention at [facility] for c/o DOE, there she was observed to be in 2:1 AVBlock and transferred to [facility] for further care and management.  The patient a few months ago was referred to Dr. Rockey Situ for SOB with exertion, at that visit EKG was SR w/RBBB, planned for echo and suspected her exertional intolerance was 2/2 sedentary life style.  Echo noted LVEF 55-60%, grade I DD, no significant VHD, PACs.  The patient tells me for a few months she has felt fatigued and then [place] when [place] the beach getting winded easily, reporting to having been walking for exercise with good exertional capacity prior to that.  Her PMD and Rheumatologist adjusted meds such as her crestor and methotrexate and always feeling better for a few weeks, then fatigued again.  The few days leading to her admission more fatigued even at rest, no energy, never had any kind of CP, palpitations, no dizzy spells, no near syncope or syncope.  She started to keep an eye on her BP noting it unusually high and her HR 40's.  This persisted, she went to the PMD office, noted to be in 2:1 AVBlock and referred to the ER.   LABS K+ 3.8 BUN/Creat 13/0.79 WBC 6.5 H/H 12/38 Plts 252  (03/21/18) TSH 1.06  Home meds reviewed, no potential  HR limiting/nodal blocking agents  Past Medical History:  Diagnosis Date  . GERD (gastroesophageal reflux disease)   . Headache    "dull one sometimes daily, at least  weekly in last couple months" (07/22/2018)  . Hyperlipidemia   . Hypertension   . 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 Lichen sclerosus   . Osteopenia   . Pneumonia 2012? X 1  . Psoriasis   . Psoriatic arthritis ([place])    " dx'd the 1st of this year, 2019" (07/22/2018)  . Rosacea   . Seborrheic keratosis   . Sleep apnea    "using nasal pilllows since ~ 12/2017" (07/22/2018)    Past Surgical History:  Procedure Laterality Date  . APPENDECTOMY    . BREAST BIOPSY Left 1978   "benign"  . BREAST BIOPSY Bilateral    "several; all benign" (07/22/2018)     Home Medications:  Prior to Admission medications   Medication Sig Start Date End Date Taking? Authorizing Provider  acetaminophen (TYLENOL) 325 MG tablet Take by mouth every 6 (six) hours as needed for mild pain or headache.   Yes [provider]  alendronate (FOSAMAX) 35 MG tablet TAKE 1 TABLET EVERY 7 DAYS WITH A FULL GLASS OF WATER ON AN EMPTY STOMACH Patient taking differently: Take 35 mg by mouth every Sunday.  12/18/17  Yes Leone Haven, MD  calcium carbonate (TUMS EX) 750 MG chewable tablet Chew 2 tablets by mouth as needed for heartburn.   Yes [provider]  Cholecalciferol (VITAMIN D3) 2000 units TABS Take 2,000 Units by mouth daily.   Yes [provider]  Clobetasol Prop Emollient Base (  CLOBETASOL PROPIONATE E) 0.05 % emollient cream Apply topically every Sunday.    Yes [provider]  desonide (DESOWEN) 0.05 % ointment Apply 1 application topically 2 (two) times daily as needed (for psoriasis).    Yes [provider]  folic acid (FOLVITE) 1 MG tablet Take 1 mg by mouth daily. 12/10/17 12/10/18 Yes [provider]  HUMIRA PEN 40 MG/0.4ML PNKT Inject 0.4 mLs into the skin every 14 (fourteen) days.   Yes [provider]  metroNIDAZOLE (METROCREAM) 0.75 % cream Apply topically 2 (two) times daily as needed (for infection).    Yes [provider]  nystatin  cream (MYCOSTATIN) Apply 1 application topically 2 (two) times daily as needed for dry skin.    Yes [provider]  rosuvastatin (CRESTOR) 20 MG tablet Take 10 mg by mouth at bedtime.    Yes [provider]  tacrolimus (PROTOPIC) 0.1 % ointment Apply topically 2 (two) times daily as needed (for psoriasis).    Yes [provider]  valsartan (DIOVAN) 80 MG tablet Take 1 tablet (80 mg total) by mouth daily. 12/18/17  Yes Leone Haven, MD    Inpatient Medications: Scheduled Meds: . cholecalciferol  2,000 Units Oral Daily  . folic acid  1 mg Oral Daily  . gentamicin irrigation  80 mg Irrigation On Call  . heparin  5,000 Units Subcutaneous Q8H  . irbesartan  75 mg Oral Daily  . rosuvastatin  10 mg Oral QHS   Continuous Infusions: . sodium chloride 50 mL/hr at 07/23/18 0608  .  ceFAZolin (ANCEF) IV     PRN Meds: acetaminophen, nitroGLYCERIN, ondansetron (ZOFRAN) IV  Allergies:   No Known Allergies  Social History:   Social History   Socioeconomic History  . Marital status: Single    Spouse name: Not on file  . Number of children: Not on file  . Years of education: Not on file  . Highest education level: Not on file  Occupational History  . Not on file  Social Needs  . Financial resource strain: Not hard at all  . Food insecurity:    Worry: Never true    Inability: Never true  . Transportation needs:    Medical: No    Non-medical: No  Tobacco Use  . Smoking status: Former Research scientist (life sciences)  . Smokeless tobacco: Never Used  . Tobacco comment: 07/22/2018 "smoked some in college; 1960s;  nothing since"  Substance and Sexual Activity  . Alcohol use: Yes    Comment: 07/22/2018 "nothing since 11/2017; did drink 1 glass of wine/wk before that"  . Drug use: Never  . Sexual activity: Not Currently  Lifestyle  . Physical activity:    Days per week: 2 days    Minutes per session: 60 min  . Stress: Not at all  Relationships  . Social connections:    Talks  on phone: Not on file    Gets together: Not on file    Attends religious service: Not on file    Active member of club or organization: Not on file    Attends meetings of clubs or organizations: Not on file    Relationship status: Not on file  . Intimate partner violence:    Fear of current or ex partner: Not on file    Emotionally abused: Not on file    Physically abused: Not on file    Forced sexual activity: Not on file  Other Topics Concern  . Not on file  Social History  Narrative  . Not on file    Family History:   Family History  Problem Relation Age of Onset  . Stroke Mother   . Atrial fibrillation Mother   . Breast cancer Mother   . Stroke Father   . Breast cancer Maternal Aunt   . Breast cancer Paternal Aunt      ROS:  Please see the history of present illness.  All other ROS reviewed and negative.     Physical Exam/Data:   Vitals:   07/22/18 2014 07/22/18 2100 07/23/18 0508 07/23/18 0900  BP: (!) 143/49  (!) 148/42 (!) 149/48  Pulse: (!) 47 (!) 44 (!) 36 (!) 36  Resp: 16 16    Temp:   98.1 F (36.7 C)   TempSrc: Oral  Oral   SpO2: 90% 96% 97% 100%  Weight:   80.6 kg   Height:        Intake/Output Summary (Last 24 hours) at 07/23/2018 1057 Last data filed at 07/23/2018 5102 Gross per 24 hour  Intake 240 ml  Output 300 ml  Net -60 ml   Filed Weights   07/22/18 1800 07/23/18 0508  Weight: 79.7 kg 80.6 kg   Body mass index is 28.68 kg/m.  General:  Well nourished, well developed, in no acute distress HEENT: normal Lymph: no adenopathy Neck: no JVD Endocrine:  No thryomegaly Vascular: No carotid bruits  Cardiac:  RRR; bradycardic, no murmurs, gallops or rubs Lungs:  CTA b/l, no wheezing, rhonchi or rales  Abd: soft, nontender, no hepatomegaly  Ext: no edema Musculoskeletal:  No deformities Skin: warm and dry  Neuro:  no focal abnormalities noted Psych:  Normal affect   EKG:  The EKG was personally reviewed and demonstrates:   2:1  AVBlock, RBBB, 44bpm Telemetry:  Telemetry was personally reviewed and demonstrates:   2:1 heart block persistently, V rates 30's  Relevant CV Studies:  04/25/18: TTE Study Conclusions - Left ventricle: The cavity size was normal. Systolic function was   normal. The estimated ejection fraction was in the range of 55%   to 60%. Wall motion was normal; there were no regional wall   motion abnormalities. Doppler parameters are consistent with   abnormal left ventricular relaxation (grade 1 diastolic   dysfunction). - Aortic valve: There was trivial regurgitation. - Mitral valve: There was mild regurgitation. - Left atrium: The atrium was moderately dilated. - Right ventricle: Systolic function was normal. - Pulmonary arteries: Systolic pressure was within the normal   range. - Inferior vena cava: The vessel was normal in size. The   respirophasic diameter changes were in the normal range (>= 50%),   consistent with normal central venous pressure. Impressions: - Frequent APCs noted.  Laboratory Data:  Chemistry Recent Labs  Lab 07/22/18 1328 07/22/18 2149  NA 141  --   K 3.8  --   CL 109  --   CO2 24  --   GLUCOSE 95  --   BUN 13  --   CREATININE 0.64 0.79  CALCIUM 9.2  --   GFRNONAA >60 >60  GFRAA >60 >60  ANIONGAP 8  --     Recent Labs  Lab 07/22/18 1328  PROT 7.2  ALBUMIN 4.3  AST 17  ALT 18  ALKPHOS 86  BILITOT 0.9   Hematology Recent Labs  Lab 07/22/18 1328 07/22/18 2149  WBC 6.2 6.5  RBC 3.99 3.98  HGB 12.7 12.1  HCT 38.8 38.5  MCV 97.2 96.7  MCH 31.8  30.4  MCHC 32.7 31.4  RDW 13.0 12.9  PLT 252 252   Cardiac Enzymes Recent Labs  Lab 07/22/18 1328  TROPONINI <0.03   No results for input(s): TROPIPOC in the last 168 hours.  BNPNo results for input(s): BNP, PROBNP in the last 168 hours.  DDimer No results for input(s): DDIMER in the last 168 hours.  Radiology/Studies:  No results found.  Assessment and Plan:   1. Advanced heart block,  Mobitz II, V rates persistently 30's     Symptomatic bradycardia     No reversible causes are noted     RBBB at baseline     BP stable  Agree with PPM implant, discussed with the patient the recommendation, and rational.  Discussed the PPM implant procedure, potential risks and benefits.  She is agreeable to proceed.  Dr. Rayann Heman will see later today   For questions or updates, please contact [facility] Please consult [email or web address] for contact info under     Signed, Baldwin Jamaica, PA-C  07/23/2018 10:57 AM   I have seen, examined the patient, and reviewed the above assessment and plan.  Changes to above are made where necessary.  On exam, bradycardic regular rhythm.  The patient has symptomatic AV block with chronic degenerative changes by ekg.  I would therefore recommend pacemaker implantation at this time.  Risks, benefits, alternatives to pacemaker implantation were discussed in detail with the patient today. The patient understands that the risks include but are not limited to bleeding, infection, pneumothorax, perforation, tamponade, vascular damage, renal failure, MI, stroke, death,  and lead dislodgement and wishes to proceed.     Co Sign: Thompson Grayer, MD 07/23/2018 12:25 PM

## 2018-07-23 NOTE — Care Management Obs Status (Signed)
[facility] NOTIFICATION   Patient Details  Name: DREW HERMAN MRN: [id number] Date of Birth: 06/15/1941   Medicare Observation Status Notification Given:  Yes    Bethena Roys, RN 07/23/2018, 4:24 PM

## 2018-07-23 NOTE — Interval H&P Note (Signed)
History and Physical Interval Note:  07/23/2018 12:27 PM  Alison Huffman  has presented today for surgery, with the diagnosis of hb  The various methods of treatment have been discussed with the patient and family. After consideration of risks, benefits and other options for treatment, the patient has consented to  Procedure(s): PACEMAKER IMPLANT (N/A) as a surgical intervention .  The patient's history has been reviewed, patient examined, no change in status, stable for surgery.  I have reviewed the patient's chart and labs.  Questions were answered to the patient's satisfaction.     Thompson Grayer

## 2018-07-23 NOTE — Discharge Instructions (Signed)
° ° °  Supplemental Discharge Instructions for  Pacemaker/Defibrillator Patients  Activity No heavy lifting or vigorous activity with your left/right arm for 6 to 8 weeks.  Do not raise your left/right arm above your head for one week.  Gradually raise your affected arm as drawn below.             07/27/18                   07/28/18                  07/29/18                 07/30/18 __  NO DRIVING for  1 week  ; you may begin driving on   38/10/17  .  WOUND CARE - Keep the wound area clean and dry.  Do not get this area wet, no showers until cleared to at your wound check visit. - The tape/steri-strips on your wound will fall off; do not pull them off.  No bandage is needed on the site.  DO  NOT apply any creams, oils, or ointments to the wound area. - If you notice any drainage or discharge from the wound, any swelling or bruising at the site, or you develop a fever > 101? F after you are discharged home, call the office at once.  Special Instructions - You are still able to use cellular telephones; use the ear opposite the side where you have your pacemaker/defibrillator.  Avoid carrying your cellular phone near your device. - When traveling through airports, show security personnel your identification card to avoid being screened in the metal detectors.  Ask the security personnel to use the hand wand. - Avoid arc welding equipment, MRI testing (magnetic resonance imaging), TENS units (transcutaneous nerve stimulators).  Call the office for questions about other devices. - Avoid electrical appliances that are in poor condition or are not properly grounded. - Microwave ovens are safe to be near or to operate.  Additional information for defibrillator patients should your device go off: - If your device goes off ONCE and you feel fine afterward, notify the device clinic nurses. - If your device goes off ONCE and you do not feel well afterward, call 911. - If your device goes off TWICE,  call 911. - If your device goes off THREE times in one day, call 911.  DO NOT DRIVE YOURSELF OR A FAMILY MEMBER WITH A DEFIBRILLATOR TO THE HOSPITAL--CALL 911.

## 2018-07-24 ENCOUNTER — Encounter (HOSPITAL_COMMUNITY): Payer: Self-pay | Admitting: Internal Medicine

## 2018-07-24 ENCOUNTER — Inpatient Hospital Stay (HOSPITAL_COMMUNITY): Payer: Medicare Other

## 2018-07-24 LAB — BASIC METABOLIC PANEL
Anion gap: 9 (ref 5–15)
BUN: 11 mg/dL (ref 8–23)
CO2: 21 mmol/L — ABNORMAL LOW (ref 22–32)
Calcium: 8.9 mg/dL (ref 8.9–10.3)
Chloride: 111 mmol/L (ref 98–111)
Creatinine, Ser: 0.67 mg/dL (ref 0.44–1.00)
GFR calc Af Amer: >60 mL/min (ref >60)
GFR calc non Af Amer: >60 mL/min (ref >60)
Glucose, Bld: 91 mg/dL (ref 70–99)
Potassium: 3.8 mmol/L (ref 3.5–5.1)
Sodium: 141 mmol/L (ref 135–145)

## 2018-07-24 NOTE — Progress Notes (Signed)
Doing very well s/p PPM Device interrogation is personally reviewed and normal CXR reveals stable leads and ptx.  Ok to discharge to home with routine wound care and follow-up  Thompson Grayer MD, [facility] 07/24/2018 8:09 AM

## 2018-07-26 ENCOUNTER — Emergency Department: Payer: Medicare Other

## 2018-07-26 ENCOUNTER — Telehealth: Payer: Self-pay | Admitting: Family Medicine

## 2018-07-26 ENCOUNTER — Emergency Department
Admission: EM | Admit: 2018-07-26 | Discharge: 2018-07-26 | Disposition: A | Discharge status: Home / Self Care | Payer: Medicare Other | Attending: Emergency Medicine | Admitting: Emergency Medicine

## 2018-07-26 DIAGNOSIS — Z79899 Other long term (current) drug therapy: Secondary | ICD-10-CM | POA: Insufficient documentation

## 2018-07-26 DIAGNOSIS — Z87891 Personal history of nicotine dependence: Secondary | ICD-10-CM | POA: Insufficient documentation

## 2018-07-26 DIAGNOSIS — R079 Chest pain, unspecified: Secondary | ICD-10-CM | POA: Diagnosis not present

## 2018-07-26 DIAGNOSIS — Z950 Presence of cardiac pacemaker: Secondary | ICD-10-CM | POA: Insufficient documentation

## 2018-07-26 DIAGNOSIS — K2900 Acute gastritis without bleeding: Secondary | ICD-10-CM | POA: Diagnosis not present

## 2018-07-26 DIAGNOSIS — R0789 Other chest pain: Secondary | ICD-10-CM | POA: Diagnosis not present

## 2018-07-26 DIAGNOSIS — R1013 Epigastric pain: Secondary | ICD-10-CM | POA: Diagnosis not present

## 2018-07-26 DIAGNOSIS — I10 Essential (primary) hypertension: Secondary | ICD-10-CM | POA: Insufficient documentation

## 2018-07-26 LAB — COMPREHENSIVE METABOLIC PANEL
ALT: 43 U/L (ref 0–44)
AST: 64 U/L — ABNORMAL HIGH (ref 15–41)
Albumin: 4 g/dL (ref 3.5–5.0)
Alkaline Phosphatase: 87 U/L (ref 38–126)
Anion gap: 9 (ref 5–15)
BUN: 11 mg/dL (ref 8–23)
CO2: 24 mmol/L (ref 22–32)
Calcium: 10.2 mg/dL (ref 8.9–10.3)
Chloride: 108 mmol/L (ref 98–111)
Creatinine, Ser: 0.59 mg/dL (ref 0.44–1.00)
GFR calc Af Amer: >60 mL/min (ref >60)
GFR calc non Af Amer: >60 mL/min (ref >60)
Glucose, Bld: 103 mg/dL — ABNORMAL HIGH (ref 70–99)
Potassium: 4.2 mmol/L (ref 3.5–5.1)
Sodium: 141 mmol/L (ref 135–145)
Total Bilirubin: 0.7 mg/dL (ref 0.3–1.2)
Total Protein: 7.2 g/dL (ref 6.5–8.1)

## 2018-07-26 LAB — TROPONIN I
Troponin I: 0.03 ng/mL (ref <0.03)
Troponin I: 0.03 ng/mL (ref <0.03)

## 2018-07-26 LAB — LIPASE, BLOOD: Lipase: 27 U/L (ref 11–51)

## 2018-07-26 LAB — CBC
HCT: 42.8 % (ref 36.0–46.0)
Hemoglobin: 13.5 g/dL (ref 12.0–15.0)
MCH: 31.2 pg (ref 26.0–34.0)
MCHC: 31.5 g/dL (ref 30.0–36.0)
MCV: 98.8 fL (ref 80.0–100.0)
Platelets: 242 10*3/uL (ref 150–400)
RBC: 4.33 MIL/uL (ref 3.87–5.11)
RDW: 12.7 % (ref 11.5–15.5)
WBC: 7.2 10*3/uL (ref 4.0–10.5)
nRBC: 0 % (ref 0.0–0.2)

## 2018-07-26 MED ORDER — ALUM & MAG HYDROXIDE-SIMETH 200-200-20 MG/5ML PO SUSP
30.0000 mL | Freq: Once | ORAL | Status: AC
  Administered 2018-07-26: 30 mL via ORAL
  Filled 2018-07-26: qty 30

## 2018-07-26 MED ORDER — FAMOTIDINE IN NACL 20-0.9 MG/50ML-% IV SOLN
20.0000 mg | Freq: Once | INTRAVENOUS | Status: AC
  Administered 2018-07-26: 20 mg via INTRAVENOUS
  Filled 2018-07-26: qty 50

## 2018-07-26 MED ORDER — LIDOCAINE VISCOUS HCL 2 % MT SOLN
15.0000 mL | Freq: Once | OROMUCOSAL | Status: AC
  Administered 2018-07-26: 15 mL via ORAL
  Filled 2018-07-26: qty 15

## 2018-07-26 NOTE — ED Notes (Signed)
PT assisted to toilet in room

## 2018-07-26 NOTE — ED Notes (Signed)
Took pt to the RR. Assisted back to bed.

## 2018-07-26 NOTE — ED Notes (Signed)
Paged [employer] interrogation rep to check on report from interrogation.

## 2018-07-26 NOTE — Telephone Encounter (Signed)
Attempt made for TCM call patient is currently in ED.

## 2018-07-26 NOTE — ED Triage Notes (Signed)
Pt arrives from home via [facility] c/o CP. Pt states she developed indegestion last night, no relief with tums. Sx persisted this morning, states she "feels full" in her epigastrium. Pace maker placed 3 days ago. Cardiac protocol initiated.

## 2018-07-26 NOTE — ED Provider Notes (Signed)
[facility] Emergency Department Provider Note   ____________________________________________    I have reviewed the triage vital signs and the nursing notes.   HISTORY  Chief Complaint Chest Pain     HPI Alison Huffman is a 77 y.o. female who presents with complaints of lower chest discomfort and epigastric discomfort and burping.  Patient had a pacemaker placed 2 days ago.  She reports since then when she leans forward she feels a fullness in her lower chest, upper epigastrium.  She has had frequent belching as well.  Denies chest pressure.  No shortness of breath.  No fevers or chills.  No tenderness over pacemaker site.   Past Medical History:  Diagnosis Date  . GERD (gastroesophageal reflux disease)   . Headache    "dull one sometimes daily, at least weekly in last couple months" (07/22/2018)  . Hyperlipidemia   . Hypertension   . 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 Lichen sclerosus   . Osteopenia   . Pneumonia 2012? X 1  . Psoriasis   . Psoriatic arthritis ([place])    " dx'd the 1st of this year, 2019" (07/22/2018)  . Rosacea   . Seborrheic keratosis   . Sleep apnea    "using nasal pilllows since ~ 12/2017" (07/22/2018)    Patient Active Problem List   Diagnosis Date Noted  . Bradycardia 07/22/2018  . Symptomatic bradycardia 07/22/2018  . Intermittent claudication ([place]) 03/21/2018  . Exertional dyspnea 03/21/2018  . Greater trochanteric bursitis of both hips 12/18/2017  . Prediabetes 09/19/2017  . Stiffness of right hand joint 08/28/2017  . Bilateral leg edema 08/28/2017  . 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 Allergic rhinitis 06/14/2017  . Acute right ankle pain 06/14/2017  . History of carotid stenosis 06/14/2017  . Psoriatic arthritis ([place]) 06/14/2017  . Lichen sclerosus   . GERD (gastroesophageal reflux disease)   . Hypertension 03/02/2017  . Hyperlipidemia 03/02/2017  . OSA (obstructive sleep apnea) 03/02/2017  . Eye exam,  routine 03/02/2017  . Osteopenia 03/02/2017    Past Surgical History:  Procedure Laterality Date  . APPENDECTOMY    . BREAST BIOPSY Left 1978   "benign"  . BREAST BIOPSY Bilateral    "several; all benign" (07/22/2018)  . PACEMAKER IMPLANT N/A 07/23/2018   Procedure: PACEMAKER IMPLANT;  Surgeon: Thompson Grayer, MD;  Location: [facility];  Service: Cardiovascular;  Laterality: N/A;    Prior to Admission medications   Medication Sig Start Date End Date Taking? Authorizing Provider  acetaminophen (TYLENOL) 325 MG tablet Take 325-650 mg by mouth every 6 (six) hours as needed for mild pain or headache.    Yes [provider]  alendronate (FOSAMAX) 35 MG tablet TAKE 1 TABLET EVERY 7 DAYS WITH A FULL GLASS OF WATER ON AN EMPTY STOMACH Patient taking differently: Take 35 mg by mouth every Sunday.  12/18/17  Yes Leone Haven, MD  calcium carbonate (TUMS EX) 750 MG chewable tablet Chew 2 tablets by mouth as needed for heartburn.   Yes [provider]  cholecalciferol (VITAMIN D) 1000 units tablet Take 2,000 Units by mouth daily.    Yes [provider]  Clobetasol Prop Emollient Base (CLOBETASOL PROPIONATE E) 0.05 % emollient cream Apply topically every Sunday.    Yes [provider]  desonide (DESOWEN) 0.05 % ointment Apply 1 application topically 2 (two) times daily as needed (for psoriasis).    Yes [provider]  folic acid (FOLVITE) 1 MG tablet  Take 1 mg by mouth daily. 12/10/17 12/10/18 Yes [provider]  HUMIRA PEN 40 MG/0.4ML PNKT Inject 0.4 mLs into the skin every 14 (fourteen) days.   Yes [provider]  methotrexate (RHEUMATREX) 2.5 MG tablet Take 15 mg by mouth once a week. 07/22/18  Yes [provider]  metroNIDAZOLE (METROCREAM) 0.75 % cream Apply topically 2 (two) times daily as needed (for infection).    Yes [provider]  nystatin cream (MYCOSTATIN) Apply 1 application topically 2 (two)  times daily as needed for dry skin.    Yes [provider]  rosuvastatin (CRESTOR) 20 MG tablet Take 10 mg by mouth at bedtime.    Yes [provider]  tacrolimus (PROTOPIC) 0.1 % ointment Apply topically 2 (two) times daily as needed (for psoriasis).    Yes [provider]  valsartan (DIOVAN) 80 MG tablet Take 1 tablet (80 mg total) by mouth daily. 12/18/17  Yes Leone Haven, MD     Allergies Patient has no known allergies.  Family History  Problem Relation Age of Onset  . Stroke Mother   . Atrial fibrillation Mother   . Breast cancer Mother   . Stroke Father   . Breast cancer Maternal Aunt   . Breast cancer Paternal Aunt     Social History Social History   Tobacco Use  . Smoking status: Former Research scientist (life sciences)  . Smokeless tobacco: Never Used  . Tobacco comment: 07/22/2018 "smoked some in college; 1960s;  nothing since"  Substance Use Topics  . Alcohol use: Yes    Comment: 07/22/2018 "nothing since 11/2017; did drink 1 glass of wine/wk before that"  . Drug use: Never    Review of Systems  Constitutional: No fever/chills Eyes: No visual changes.  ENT: No sore throat. Cardiovascular: As above Respiratory: Denies shortness of breath. Gastrointestinal: As above Genitourinary: Negative for dysuria. Musculoskeletal: Negative for back pain. Skin: Negative for rash. Neurological: Negative for headaches or weakness   ____________________________________________   PHYSICAL EXAM:  VITAL SIGNS: ED Triage Vitals  Enc Vitals Group     BP 07/26/18 1033 (!) 158/71     Pulse Rate 07/26/18 1039 60     Resp 07/26/18 1039 17     Temp --      Temp src --      SpO2 07/26/18 1039 95 %     Weight --      Height --      Head Circumference --      Peak Flow --      Pain Score --      Pain Loc --      Pain Edu? --      Excl. in [place]? --     Constitutional: Alert and oriented. No acute distress. Pleasant and interactive.  Frequent belching Eyes:  Conjunctivae are normal.   Nose: No congestion/rhinnorhea. Mouth/Throat: Mucous membranes are moist.    Cardiovascular: Normal rate, regular rhythm. Grossly normal heart sounds.  Good peripheral circulation.  Pacemaker incision healing well, no evidence of infection Respiratory: Normal respiratory effort.  No retractions. Lungs CTAB. Gastrointestinal: Soft and nontender. No distention.    Musculoskeletal: No lower extremity tenderness nor edema.  Warm and well perfused Neurologic:  Normal speech and language. No gross focal neurologic deficits are appreciated.  Skin:  Skin is warm, dry and intact. No rash noted. Psychiatric: Mood and affect are normal. Speech and behavior are normal.  ____________________________________________   LABS (all labs ordered are listed, but  only abnormal results are displayed)  Labs Reviewed  COMPREHENSIVE METABOLIC PANEL - Abnormal; Notable for the following components:      Result Value   Glucose, Bld 103 (*)    AST 64 (*)    All other components within normal limits  TROPONIN I - Abnormal; Notable for the following components:   Troponin I 0.03 (*)    All other components within normal limits  CBC  LIPASE, BLOOD  TROPONIN I   ____________________________________________  EKG  ED ECG REPORT I, Lavonia Drafts, the attending physician, personally viewed and interpreted this ECG.  Date: 07/26/2018  Rhythm: AV dual paced QRS Axis: Abnormal Intervals: Abnormal ST/T Wave abnormalities: normal   ____________________________________________  RADIOLOGY  Chest x-ray unremarkable, leads appear in appropriate place ____________________________________________   PROCEDURES  Procedure(s) performed: No  Procedures   Critical Care performed: No ____________________________________________   INITIAL IMPRESSION / ASSESSMENT AND PLAN / ED COURSE  Pertinent labs & imaging results that were available during my care of the patient were  reviewed by me and considered in my medical decision making (see chart for details).  Patient presents with symptoms as detailed above.  Overall well-appearing EKG is unremarkable.  Troponin is 0.03.  Lab work is otherwise benign.  Chest x-ray shows no acute changes.  Discussed with Dr. Rockey Situ of cardiology who recommends calling EP.  Discussed with Dr. Curt Bears of electrophysiology who recommends interrogating pacemaker.  Pacemaker interrogation is reportedly normal, pending formal report.  Second troponin pending, have asked Dr. Clearnce Hasten to follow-up and if no significant change or decrease to discharge.  Discussed this with the patient and she is comfortable with plan.    ____________________________________________   FINAL CLINICAL IMPRESSION(S) / ED DIAGNOSES  Final diagnoses:  Acute gastritis without hemorrhage, unspecified gastritis type        Note:  This document was prepared using Dragon voice recognition software and may include unintentional dictation errors.    Lavonia Drafts, MD 07/26/18 [phone]

## 2018-07-26 NOTE — ED Provider Notes (Signed)
Signout from Dr. Corky Downs in this 77 year old female who was presented to the emergency department with belching and recent pacemaker placement.  Initial troponin of 0.03.  Plan is follow-up with repeat troponin and decision accordingly.  If troponin is equal or less than original troponin patient may be discharged home to cardiology follow-up.  Physical Exam  BP (!) 166/82   Pulse 60   Resp 17   SpO2 95%  ----------------------------------------- 5:33 PM on 07/26/2018 -----------------------------------------   Physical Exam Patient resting comfortably this time.  No further complaints.  However, is requesting IV Pepcid which he says she did not receive. ED Course/Procedures     Procedures  MDM  Patient with second troponin VIII 0.03.  Appears stable.  No further complaints of the patient.  Will be discharged at this time.       Orbie Pyo, MD 07/26/18 [phone]

## 2018-07-29 ENCOUNTER — Telehealth: Payer: Self-pay

## 2018-07-29 ENCOUNTER — Encounter: Payer: Self-pay | Admitting: Family Medicine

## 2018-07-29 ENCOUNTER — Ambulatory Visit (INDEPENDENT_AMBULATORY_CARE_PROVIDER_SITE_OTHER): Payer: Medicare Other | Admitting: Family Medicine

## 2018-07-29 VITALS — BP 112/60 | HR 60 | Temp 98.7°F | Ht 66.0 in | Wt 176.6 lb

## 2018-07-29 DIAGNOSIS — M8580 Other specified disorders of bone density and structure, unspecified site: Secondary | ICD-10-CM | POA: Diagnosis not present

## 2018-07-29 DIAGNOSIS — L4050 Arthropathic psoriasis, unspecified: Secondary | ICD-10-CM

## 2018-07-29 DIAGNOSIS — K219 Gastro-esophageal reflux disease without esophagitis: Secondary | ICD-10-CM

## 2018-07-29 DIAGNOSIS — E782 Mixed hyperlipidemia: Secondary | ICD-10-CM | POA: Diagnosis not present

## 2018-07-29 DIAGNOSIS — I441 Atrioventricular block, second degree: Secondary | ICD-10-CM

## 2018-07-29 DIAGNOSIS — M810 Age-related osteoporosis without current pathological fracture: Secondary | ICD-10-CM

## 2018-07-29 MED ORDER — PANTOPRAZOLE SODIUM 40 MG PO TBEC: 40.0000 mg | DELAYED_RELEASE_TABLET | Freq: Every day | ORAL | 3 refills | Status: DC

## 2018-07-29 NOTE — Telephone Encounter (Addendum)
Transition Care Management Follow-up Telephone Call   Date discharged?07/24/18   How have you been since you were released from the hospital? Had a return trip to the ED on 10/26 for excessive belching and indigestion. All tests were normal.  Better now.  Sleeping good.  Eating/drinking without issues. Denies chest pain, headache, dizziness, blurred vision, nausea, vomiting.  Doing well range of motion exercises for her arms.    Do you understand why you were in the hospital? Yes, heart block and bradycardia.  I received a pacemaker.    Do you understand the discharge instructions? Yes, increase activity as tolerated. Follow up with pcp, wound care and cardiology as directed. Do not drive. Sponge bath only.    Where were you discharged to? Home.    Items Reviewed:  Medications reviewed: Yes, taking all scheduled medications except fosamax. Holding per patient preference until office visit with her pcp.  Discuss all medications with indigestion side effects.   Allergies reviewed: Yes, No known allergies.  Dietary changes reviewed: Bland diet.  Referrals reviewed: Yes, wound care and cardiologist appointment scheduled.    Functional Questionnaire:   Activities of Daily Living (ADLs):   She states they are independent in the following: Independent in all ADLs.  States they require assistance with the following:No assistance required with ADLs.    Any transportation issues/concerns?: No   Any patient concerns? Yes, discuss medication fosamax in regards to indigestion side effect.  Schedule bone density.  Request information for bland diet.    Confirmed importance and date/time of follow-up visits scheduled Yes, 07/29/18 at 4:00.  Provider Appointment booked with Dr.Sonnenberg (PCP).  Confirmed with patient if condition begins to worsen call PCP or go to the ER.  Patient was given the office number and encouraged to call back with question or concerns.  : Yes.

## 2018-07-29 NOTE — Telephone Encounter (Signed)
Reviewed recent ER notes.  Pt discharged with gastritis.  Pt has f/u with PCP this Friday.  Has f/u with device clinic 08/06/2018.  Discussed with Pt and Dr. Rayann Heman.  Will start Pt on Protonix 40 mg daily.    Will get PCP notes from Pt f/u and continue to monitor for need to be seen by cardiologist.

## 2018-07-29 NOTE — Assessment & Plan Note (Signed)
Patient symptoms are most consistent with gastritis and GERD.  I encouraged her to start the Protonix with taking it 30 minutes before dinner tonight and then taking it 30 minutes before breakfast daily.  Discussed discontinuing her Fosamax as this could be exacerbating this issue.

## 2018-07-29 NOTE — Assessment & Plan Note (Signed)
Discussed the purpose of Humira for her.  She will also discuss this with her rheumatologist.

## 2018-07-29 NOTE — Assessment & Plan Note (Signed)
She will hold Fosamax given gastritis and reflux.  Check DEXA scan.

## 2018-07-29 NOTE — Assessment & Plan Note (Addendum)
Patient has done quite well after pacemaker placement.  Her symptoms have improved significantly.  She will continue to monitor and follow-up with cardiology as planned.

## 2018-07-29 NOTE — Patient Instructions (Signed)
Nice to see you. We will get you set up for a bone density scan.  Please stop the Fosamax. Please see cardiology as planned.

## 2018-07-29 NOTE — Assessment & Plan Note (Signed)
Continue Crestor 10 mg daily.  If she has recurrence of leg aches she will let us or her cardiologist know.

## 2018-07-29 NOTE — Progress Notes (Signed)
Tommi Rumps, MD Phone: [phone]  Alison Huffman is a 77 y.o. female who presents today for f/u.  CC: Mobitz type II second-degree heart block, gastritis, hyperlipidemia, psoriatic arthritis  Patient was recently hospitalized from 07/22/2018-07/24/2018 for Mobitz type II second-degree heart block and symptomatic bradycardia with no reversible cause found.  She underwent implantation of a PPM.  She was monitored on telemetry which demonstrated sinus rhythm ventricularly paced.  Chest x-ray demonstrated no pneumothorax status post device implantation.  She has continued to feel quite well after pacemaker placement.  Her breathing has improved quite a bit.  She has follow-up with cardiology in the next week or 2 for a wound check and then follows up in January with her cardiologist.  Discharge summary reviewed.  Medications reviewed.  Patient went to the emergency department last week with an episode of indigestion and belching.  She was diagnosed with gastritis.  Her troponins were 0.032.  She reports her symptoms have been improving.  Initially felt as though she had a tightness across her chest.  Bending over hurt and if she pulled with her arm it would hurt in her chest as well.  She notes no blood in her stool.  No melena.  She does note some burping and sour taste since leaving the hospital.  She is passing lots of gas.  No diarrhea, vomiting, nausea, or abdominal pain.  She does have a history of reflux in the past for which she would take Tums.  She spoke with her cardiology office today and they advised that given her symptoms had improved they did not need to see her in the office.  They suggested starting on Protonix which she will pick up after leaving the office here.  She stopped Crestor previously to see if that would help with her leg pain.  She notes she felt good for about 5 weeks.  She started back on a lower dose and has not had any recurrence of leg aches yet.  Psoriatic  arthritis: Was on methotrexate though she has transition to Humira.  She wonders if she really needs to take this.  Social History   Tobacco Use  Smoking Status Former Smoker  Smokeless Tobacco Never Used  Tobacco Comment   07/22/2018 "smoked some in college; 1960s;  nothing since"     ROS see history of present illness  Objective  Physical Exam Vitals:   07/29/18 1604 07/29/18 1641  BP: 112/60   Pulse: 60   Temp: 98.7 F (37.1 C)   SpO2: 92% 94%    BP Readings from Last 3 Encounters:  07/29/18 112/60  07/26/18 (!) 158/70  07/24/18 130/86   Wt Readings from Last 3 Encounters:  07/29/18 176 lb 9.6 oz (80.1 kg)  07/24/18 178 lb 12.8 oz (81.1 kg)  07/22/18 179 lb 3.7 oz (81.3 kg)    Physical Exam  Constitutional: No distress.  Cardiovascular: Normal rate, regular rhythm and normal heart sounds.  Pulmonary/Chest: Effort normal and breath sounds normal.    Abdominal: Soft. Bowel sounds are normal. She exhibits no distension. There is no tenderness.  Musculoskeletal: She exhibits no edema.  Neurological: She is alert.  Skin: Skin is warm and dry. She is not diaphoretic.     Assessment/Plan: Please see individual problem list.  Second degree AV block, Mobitz type II Patient has done quite well after pacemaker placement.  Her symptoms have improved significantly.  She will continue to monitor and follow-up with cardiology as planned.  Hyperlipidemia Continue  Crestor 10 mg daily.  If she has recurrence of leg aches she will let us or her cardiologist know.  GERD (gastroesophageal reflux disease) Patient symptoms are most consistent with gastritis and GERD.  I encouraged her to start the Protonix with taking it 30 minutes before dinner tonight and then taking it 30 minutes before breakfast daily.  Discussed discontinuing her Fosamax as this could be exacerbating this issue.  Osteopenia She will hold Fosamax given gastritis and reflux.  Check DEXA scan.  Psoriatic  arthritis ([place]) Discussed the purpose of Humira for her.  She will also discuss this with her rheumatologist.    Orders Placed This Encounter  Procedures  . DG Bone Density    Standing Status:   Future    Standing Expiration Date:   09/29/2019    Order Specific Question:   Reason for Exam (SYMPTOM  OR DIAGNOSIS REQUIRED)    Answer:   osteoporosis    Order Specific Question:   Preferred imaging location?    Answer:   [place]    No orders of the defined types were placed in this encounter.    Tommi Rumps, MD [place]

## 2018-08-01 ENCOUNTER — Other Ambulatory Visit: Payer: Self-pay

## 2018-08-01 ENCOUNTER — Telehealth: Payer: Self-pay

## 2018-08-01 NOTE — Telephone Encounter (Signed)
error 

## 2018-08-06 ENCOUNTER — Ambulatory Visit (INDEPENDENT_AMBULATORY_CARE_PROVIDER_SITE_OTHER): Payer: Medicare Other | Admitting: *Deleted

## 2018-08-06 DIAGNOSIS — Z950 Presence of cardiac pacemaker: Secondary | ICD-10-CM

## 2018-08-06 DIAGNOSIS — I441 Atrioventricular block, second degree: Secondary | ICD-10-CM

## 2018-08-06 LAB — CUP PACEART INCLINIC DEVICE CHECK
Battery Remaining Longevity: 128 mo
Battery Voltage: 3.08 V
Brady Statistic RA Percent Paced: 4 %
Brady Statistic RV Percent Paced: 60 %
Date Time Interrogation Session: 20191105160947
Implantable Lead Implant Date: 20191022
Implantable Lead Implant Date: 20191022
Implantable Lead Location: 753859
Implantable Lead Location: 753860
Implantable Pulse Generator Implant Date: 20191022
Lead Channel Impedance Value: 562.5 Ohm
Lead Channel Impedance Value: 575 Ohm
Lead Channel Pacing Threshold Amplitude: 0.5 V
Lead Channel Pacing Threshold Amplitude: 0.75 V
Lead Channel Pacing Threshold Pulse Width: 0.5 ms
Lead Channel Pacing Threshold Pulse Width: 0.5 ms
Lead Channel Sensing Intrinsic Amplitude: 12 mV
Lead Channel Sensing Intrinsic Amplitude: 4.1 mV
Lead Channel Setting Pacing Amplitude: 0.75 V
Lead Channel Setting Pacing Amplitude: 3.5 V
Lead Channel Setting Pacing Pulse Width: 0.5 ms
Lead Channel Setting Sensing Sensitivity: 2 mV
Pulse Gen Model: 2272
Pulse Gen Serial Number: 9073848

## 2018-08-06 NOTE — Progress Notes (Addendum)
Wound check appointment. Steri-strips removed. Wound without redness or edema. Incision edges approximated, wound healing well. Small scab noted at left lateral incision corner, no drainage or stitch noted. Patient aware to monitor for signs/symptoms of infection or stitch. Normal device function. Thresholds, sensing, and impedances consistent with implant measurements. Device programmed at 3.5V in RA with auto capture programmed on in RV for extra safety margin until 3 month visit. Histogram distribution appropriate for patient and level of activity. Vp 60%, VIP maintained off today, plan to reassess at 91 day f/u. 2 mode switches (<1%)--AT, longest 10sec. No high ventricular rates noted. Patient educated about wound care, arm mobility, lifting restrictions, and Merlin monitor. ROV with JA on 10/30/18.  Patient gave verbal permission to include the following photo:

## 2018-08-06 NOTE — Patient Instructions (Signed)
Gently wash site once or twice daily with soap and water while bathing. Call the [facility] at [phone] if you notice any signs/symptoms of infection, including drainage, redness, swelling, fever, or chills.

## 2018-08-16 ENCOUNTER — Other Ambulatory Visit: Payer: Self-pay

## 2018-08-16 DIAGNOSIS — H6123 Impacted cerumen, bilateral: Secondary | ICD-10-CM | POA: Diagnosis not present

## 2018-08-16 DIAGNOSIS — J310 Chronic rhinitis: Secondary | ICD-10-CM | POA: Diagnosis not present

## 2018-09-06 ENCOUNTER — Encounter: Payer: Self-pay | Admitting: Family

## 2018-09-06 ENCOUNTER — Ambulatory Visit (INDEPENDENT_AMBULATORY_CARE_PROVIDER_SITE_OTHER): Payer: Medicare Other

## 2018-09-06 ENCOUNTER — Other Ambulatory Visit (HOSPITAL_COMMUNITY)
Admission: RE | Admit: 2018-09-06 | Discharge: 2018-09-06 | Disposition: A | Discharge status: Home / Self Care | Payer: Medicare Other | Source: Ambulatory Visit | Attending: Family | Admitting: Family

## 2018-09-06 ENCOUNTER — Ambulatory Visit (INDEPENDENT_AMBULATORY_CARE_PROVIDER_SITE_OTHER): Payer: Medicare Other | Admitting: Family

## 2018-09-06 VITALS — BP 138/84 | HR 69 | Temp 98.1°F | Wt 179.6 lb

## 2018-09-06 DIAGNOSIS — M25512 Pain in left shoulder: Secondary | ICD-10-CM | POA: Diagnosis not present

## 2018-09-06 DIAGNOSIS — M25511 Pain in right shoulder: Secondary | ICD-10-CM | POA: Diagnosis not present

## 2018-09-06 DIAGNOSIS — R319 Hematuria, unspecified: Secondary | ICD-10-CM | POA: Insufficient documentation

## 2018-09-06 DIAGNOSIS — M19011 Primary osteoarthritis, right shoulder: Secondary | ICD-10-CM | POA: Diagnosis not present

## 2018-09-06 DIAGNOSIS — M19012 Primary osteoarthritis, left shoulder: Secondary | ICD-10-CM | POA: Diagnosis not present

## 2018-09-06 LAB — URINALYSIS, MICROSCOPIC ONLY: RBC / HPF: NONE SEEN (ref 0–?)

## 2018-09-06 LAB — POCT URINALYSIS DIPSTICK
Bilirubin, UA: NEGATIVE
Blood, UA: NEGATIVE
Glucose, UA: NEGATIVE
Ketones, UA: NEGATIVE
Leukocytes, UA: NEGATIVE
Nitrite, UA: NEGATIVE
Protein, UA: NEGATIVE
Spec Grav, UA: 1.015 (ref 1.010–1.025)
Urobilinogen, UA: 0.2 E.U./dL
pH, UA: 7 (ref 5.0–8.0)

## 2018-09-06 MED ORDER — DICLOFENAC SODIUM 1 % TD GEL: 4.0000 g | Freq: Four times a day (QID) | TRANSDERMAL | 3 refills | Status: DC

## 2018-09-06 MED ORDER — LIDOCAINE 5 % EX PTCH: 1.0000 | MEDICATED_PATCH | CUTANEOUS | 0 refills | Status: DC

## 2018-09-06 MED ORDER — CYCLOBENZAPRINE HCL 5 MG PO TABS: 5.0000 mg | ORAL_TABLET | Freq: Every evening | ORAL | 0 refills | Status: DC | PRN

## 2018-09-06 NOTE — Progress Notes (Signed)
Subjective:    Patient ID: Alison Huffman, female    DOB: November 08, 1940, 77 y.o.   MRN: [id number]  CC: Alison Huffman is a 77 y.o. female who presents today for an acute visit.    HPI:  Here primarily for left anterior- lateral shoulder pain, occurred 3 weeks ago, waxed and waned. Also has occasional pain right shoulder as well.  No injury.  Bothers her with reaching. Able to get dressed. Some relief with tylenol. Pain in shoulder is radiating to left scapular region.   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 No cp, palpitations, left arm numbness. hasnt been lifting more than 10 pounds since placed.   Notes in the past has had 'on and off' bilateral shoulder pain for past couple of years.    Notes one episode of small BR blood this week when wiped from vaginal area. Unsure if from vagina or urine.  No rectal bleeding. No vaginal pain. No thick vaginal discharge or odor. Some dysuria.  H/o hemorrhoids- not currently bothering her.   Follows with dermatology for lichen sclerosis. Has been considering GYN.  No h/o cancer.   ED for gastritis 10/19          HISTORY:  Past Medical History:  Diagnosis Date  . GERD (gastroesophageal reflux disease)   . Headache    "dull one sometimes daily, at least weekly in last couple months" (07/22/2018)  . Hyperlipidemia   . Hypertension   . 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 Lichen sclerosus   . Osteopenia   . Pneumonia 2012? X 1  . Psoriasis   . Psoriatic arthritis ([place])    " dx'd the 1st of this year, 2019" (07/22/2018)  . Rosacea   . Seborrheic keratosis   . Sleep apnea    "using nasal pilllows since ~ 12/2017" (07/22/2018)   Past Surgical History:  Procedure Laterality Date  . APPENDECTOMY    . BREAST BIOPSY Left 1978   "benign"  . BREAST BIOPSY Bilateral    "several; all benign" (07/22/2018)  . PACEMAKER IMPLANT N/A 07/23/2018   Procedure: PACEMAKER IMPLANT;  Surgeon: Thompson Grayer, MD;  Location: [facility];   Service: Cardiovascular;  Laterality: N/A;   Family History  Problem Relation Age of Onset  . Stroke Mother   . Atrial fibrillation Mother   . Breast cancer Mother   . Stroke Father   . Breast cancer Maternal Aunt   . Breast cancer Paternal Aunt     Allergies: Patient has no known allergies. Current Outpatient Medications on File Prior to Visit  Medication Sig Dispense Refill  . acetaminophen (TYLENOL) 325 MG tablet Take 325-650 mg by mouth every 6 (six) hours as needed for mild pain or headache.     . calcium carbonate (TUMS EX) 750 MG chewable tablet Chew 2 tablets by mouth as needed for heartburn.    . cholecalciferol (VITAMIN D) 1000 units tablet Take 2,000 Units by mouth daily.     . Clobetasol Prop Emollient Base (CLOBETASOL PROPIONATE E) 0.05 % emollient cream Apply topically every Sunday.     [name] desonide (DESOWEN) 0.05 % ointment Apply 1 application topically 2 (two) times daily as needed (for psoriasis).     . folic acid (FOLVITE) 1 MG tablet Take 1 mg by mouth daily.    [name] HUMIRA PEN 40 MG/0.4ML PNKT Inject 0.4 mLs into the skin every 14 (fourteen) days.    . metroNIDAZOLE (METROCREAM) 0.75 % cream Apply topically 2 (two)  times daily as needed (for infection).     . nystatin cream (MYCOSTATIN) Apply 1 application topically 2 (two) times daily as needed for dry skin.     . pantoprazole (PROTONIX) 40 MG tablet Take 1 tablet (40 mg total) by mouth daily. 90 tablet 3  . rosuvastatin (CRESTOR) 20 MG tablet Take 10 mg by mouth at bedtime.     . tacrolimus (PROTOPIC) 0.1 % ointment Apply topically 2 (two) times daily as needed (for psoriasis).     . valsartan (DIOVAN) 80 MG tablet Take 1 tablet (80 mg total) by mouth daily. 90 tablet 3   No current facility-administered medications on file prior to visit.     Social History   Tobacco Use  . Smoking status: Former Research scientist (life sciences)  . Smokeless tobacco: Never Used  . Tobacco comment: 07/22/2018 "smoked some in college; 1960s;  nothing  since"  Substance Use Topics  . Alcohol use: Yes    Comment: 07/22/2018 "nothing since 11/2017; did drink 1 glass of wine/wk before that"  . Drug use: Never    Review of Systems  Constitutional: Negative for chills and fever.  Respiratory: Negative for cough.   Cardiovascular: Negative for chest pain and palpitations.  Gastrointestinal: Negative for nausea and vomiting.  Genitourinary: Positive for vaginal bleeding. Negative for difficulty urinating, dysuria, flank pain, pelvic pain, vaginal discharge and vaginal pain.  Musculoskeletal: Positive for arthralgias.  Neurological: Negative for numbness and headaches.      Objective:    BP 138/84 (BP Location: Left Arm, Patient Position: Sitting, Cuff Size: Large)   Pulse 69   Temp 98.1 F (36.7 C)   Wt 179 lb 9.6 oz (81.5 kg)   SpO2 97%   BMI 28.99 kg/m    Physical Exam  Constitutional: She appears well-developed and well-nourished.  Eyes: Conjunctivae are normal.  Cardiovascular: Normal rate, regular rhythm, normal heart sounds and normal pulses.    Pacemaker incision as marked  Pulmonary/Chest: Effort normal and breath sounds normal. She has no wheezes. She has no rhonchi. She has no rales.  Musculoskeletal:       Right shoulder: She exhibits normal range of motion, no tenderness, no bony tenderness, no spasm, normal pulse and normal strength.       Left shoulder: She exhibits tenderness, bony tenderness, pain and spasm. She exhibits normal range of motion, normal pulse and normal strength.       Cervical back: She exhibits tenderness and pain. She exhibits normal range of motion, no bony tenderness and no swelling.       Back:       Arms: Left Shoulder:   No asymmetry of shoulders when comparing right and left. Some pain with palpation AC joint. No pain with internal and external rotation. No pain with resisted lateral extension .   Negative active painful arc sign. Negative passive arc ( Neer's). Negative drop  arm.   No pain, swelling, or ecchymosis noted over long head of biceps.  No bicep pain with resisted flexion and extension.  Negative speeds test. Negative Popeye's sign.   Strength and sensation normal BUE's.   Neurological: She is alert.  Skin: Skin is warm and dry.  Psychiatric: She has a normal mood and affect. Her speech is normal and behavior is normal. Thought content normal.  Vitals reviewed.      Assessment & Plan:   1. Acute pain of both shoulders Pain over AC joint left shoulder. Suspect OA as well as degree of  muscle  spasm over trapezius.  Pending x-ray.  Patient and I discussed conservative therapy to start with topical agents, cautious Use of Flexeril.  Referral to physical therapy.  If no improvement, patient let me know we will consult orthopedics. - Ambulatory referral to Obstetrics / Gynecology - lidocaine (LIDODERM) 5 %; Place 1 patch onto the skin daily. Remove & Discard patch within 12 hours.  Dispense: 30 patch; Refill: 0 - diclofenac sodium (VOLTAREN) 1 % GEL; Apply 4 g topically 4 (four) times daily.  Dispense: 1 Tube; Refill: 3 - DG Shoulder Left - DG Shoulder Right - Ambulatory referral to Physical Therapy - cyclobenzaprine (FLEXERIL) 5 MG tablet; Take 1 tablet (5 mg total) by mouth at bedtime as needed for muscle spasms.  Dispense: 15 tablet; Refill: 0  2. Hematuria, unspecified type No blood seen in urine dipstick.  Pending microscopic.  Concerned that scant amount of blood which patient appreciated was from vagina nature.  We jointly agreed that due to her lichen sclerosis, and our concern for vaginal bleeding postmenopausal, we would pursue consult with GYN.  Referral is in place today. - Ambulatory referral to Obstetrics / Gynecology - Urine Culture - POCT urinalysis dipstick - Urine Microscopic - Urine cytology ancillary only([place])    I have discontinued Craig Guess. [place]'s methotrexate and alendronate. I am also having her start on  lidocaine, diclofenac sodium, and cyclobenzaprine. Additionally, I am having her maintain her desonide, tacrolimus, Clobetasol Prop Emollient Base, metroNIDAZOLE, nystatin cream, folic acid, valsartan, rosuvastatin, cholecalciferol, HUMIRA PEN, calcium carbonate, acetaminophen, and pantoprazole.   Meds ordered this encounter  Medications  . lidocaine (LIDODERM) 5 %    Sig: Place 1 patch onto the skin daily. Remove & Discard patch within 12 hours.    Dispense:  30 patch    Refill:  0    Order Specific Question:   Supervising Provider    Answer:   Deborra Medina L [[id number]]  . diclofenac sodium (VOLTAREN) 1 % GEL    Sig: Apply 4 g topically 4 (four) times daily.    Dispense:  1 Tube    Refill:  3    Order Specific Question:   Supervising Provider    Answer:   Derrel Nip, TERESA L [[id number]]  . cyclobenzaprine (FLEXERIL) 5 MG tablet    Sig: Take 1 tablet (5 mg total) by mouth at bedtime as needed for muscle spasms.    Dispense:  15 tablet    Refill:  0    Order Specific Question:   Supervising Provider    Answer:   Crecencio Mc [[id number]]    Return precautions given.   Risks, benefits, and alternatives of the medications and treatment plan prescribed today were discussed, and patient expressed understanding.   Education regarding symptom management and diagnosis given to patient on AVS.  Continue to follow with Leone Haven, MD for routine health maintenance.   Alison Huffman and I agreed with plan.   Mable Paris, FNP

## 2018-09-06 NOTE — Patient Instructions (Addendum)
Trial of conservative therapy; let me know if no improvement  Today we discussed referrals, orders. PT, GYN.    I have placed these orders in the system for you.  Please be sure to give [place] a call if you have not heard from our office regarding this. We should hear from [place] within ONE week with information regarding your appointment. If not, please let me know immediately.   Do not drive or operate heavy machinery while on muscle relaxant. Please do not drink alcohol. Only take this medication as needed for acute muscle spasm at bedtime. This medication make you feel drowsy so be very careful.  Stop taking if become too drowsy or somnolent as this puts you at risk for falls. Please contact our office with any questions.

## 2018-09-07 LAB — URINE CULTURE
MICRO NUMBER:: 91463656
SPECIMEN QUALITY:: ADEQUATE

## 2018-09-08 ENCOUNTER — Telehealth: Payer: Self-pay | Admitting: Family

## 2018-09-08 NOTE — Telephone Encounter (Signed)
Call pt I sent her a mychart Does she have GYN appt scheduled yet?

## 2018-09-09 NOTE — Telephone Encounter (Signed)
LMTCB

## 2018-09-10 NOTE — Telephone Encounter (Signed)
Patient called back & she has not been contacted with an appointment. I know you were wanted her to go ahead & be scheduled. I will message Melissa regarding this to check on referral.

## 2018-09-10 NOTE — Telephone Encounter (Signed)
It was just sent to GYN today. They will give her a call today or tomorrow to get her scheduled. Thanks [employer]

## 2018-09-11 LAB — URINE CYTOLOGY ANCILLARY ONLY
Bacterial vaginitis: NEGATIVE
Candida vaginitis: NEGATIVE

## 2018-09-13 NOTE — Telephone Encounter (Signed)
sch on 09/16/18 with schuman

## 2018-09-16 ENCOUNTER — Encounter: Payer: Self-pay | Admitting: Obstetrics and Gynecology

## 2018-09-16 ENCOUNTER — Ambulatory Visit (INDEPENDENT_AMBULATORY_CARE_PROVIDER_SITE_OTHER): Payer: Medicare Other | Admitting: Obstetrics and Gynecology

## 2018-09-16 VITALS — BP 144/88 | Ht 66.5 in | Wt 178.0 lb

## 2018-09-16 DIAGNOSIS — N950 Postmenopausal bleeding: Secondary | ICD-10-CM | POA: Diagnosis not present

## 2018-09-16 DIAGNOSIS — L900 Lichen sclerosus et atrophicus: Secondary | ICD-10-CM | POA: Diagnosis not present

## 2018-09-16 NOTE — Progress Notes (Signed)
Patient ID: Alison Huffman, female   DOB: 06/10/1941, 77 y.o.   MRN: [id number]  Reason for Consult: Referral (Vaginal spotting)   Referred by Burnard Hawthorne, FNP  Subjective:     HPI:  Alison Huffman is a 77 y.o. female she reports that on 09/06/18 she had a one time bright red vaginal bleeding present when wiping. It occurred after urination.  She has had no bleeding since. This was the first time she had bleeding since menopause. She has a history of lichen sclerosis. This is managed with once weekly application of clobetasol. She sees dermatology for this treatment. She has a normal colonoscopy 3 years ago. She has small external hemorrhoids but she has not had issues with them for a few years. She denies itching, abnormal discharge, frequency, dysuria, or sensation of a vaginal bulge. She is not sexually active.   Menopause at 53  Past Medical History:  Diagnosis Date  . GERD (gastroesophageal reflux disease)   . Headache    "dull one sometimes daily, at least weekly in last couple months" (07/22/2018)  . Hyperlipidemia   . Hypertension   . 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 Lichen sclerosus   . Osteopenia   . Pneumonia 2012? X 1  . Psoriasis   . Psoriatic arthritis ([place])    " dx'd the 1st of this year, 2019" (07/22/2018)  . Rosacea   . Seborrheic keratosis   . Sleep apnea    "using nasal pilllows since ~ 12/2017" (07/22/2018)   Family History  Problem Relation Age of Onset  . Stroke Mother   . Atrial fibrillation Mother   . Breast cancer Mother   . Stroke Father   . Breast cancer Maternal Aunt   . Breast cancer Paternal Aunt    Past Surgical History:  Procedure Laterality Date  . APPENDECTOMY    . BREAST BIOPSY Left 1978   "benign"  . BREAST BIOPSY Bilateral    "several; all benign" (07/22/2018)  . PACEMAKER IMPLANT N/A 07/23/2018   Procedure: PACEMAKER IMPLANT;  Surgeon: Thompson Grayer, MD;  Location: [facility];  Service: Cardiovascular;   Laterality: N/A;    Short Social History:  Social History   Tobacco Use  . Smoking status: Former Research scientist (life sciences)  . Smokeless tobacco: Never Used  . Tobacco comment: 07/22/2018 "smoked some in college; 1960s;  nothing since"  Substance Use Topics  . Alcohol use: Yes    Comment: 07/22/2018 "nothing since 11/2017; did drink 1 glass of wine/wk before that"    No Known Allergies  Current Outpatient Medications  Medication Sig Dispense Refill  . cholecalciferol (VITAMIN D) 1000 units tablet Take 2,000 Units by mouth daily.     . Clobetasol Prop Emollient Base (CLOBETASOL PROPIONATE E) 0.05 % emollient cream Apply topically every Sunday.     . cyclobenzaprine (FLEXERIL) 5 MG tablet Take 1 tablet (5 mg total) by mouth at bedtime as needed for muscle spasms. 15 tablet 0  . desonide (DESOWEN) 0.05 % ointment Apply 1 application topically 2 (two) times daily as needed (for psoriasis).     [name] diclofenac sodium (VOLTAREN) 1 % GEL Apply 4 g topically 4 (four) times daily. 1 Tube 3  . folic acid (FOLVITE) 1 MG tablet Take 1 mg by mouth daily.    [name] HUMIRA PEN 40 MG/0.4ML PNKT Inject 0.4 mLs into the skin every 14 (fourteen) days.    [name] lidocaine (LIDODERM) 5 % Place 1 patch onto the skin daily.  Remove & Discard patch within 12 hours. 30 patch 0  . metroNIDAZOLE (METROCREAM) 0.75 % cream Apply topically 2 (two) times daily as needed (for infection).     . pantoprazole (PROTONIX) 40 MG tablet Take 1 tablet (40 mg total) by mouth daily. 90 tablet 3  . tacrolimus (PROTOPIC) 0.1 % ointment Apply topically 2 (two) times daily as needed (for psoriasis).     . valsartan (DIOVAN) 80 MG tablet Take 1 tablet (80 mg total) by mouth daily. 90 tablet 3  . acetaminophen (TYLENOL) 325 MG tablet Take 325-650 mg by mouth every 6 (six) hours as needed for mild pain or headache.     . calcium carbonate (TUMS EX) 750 MG chewable tablet Chew 2 tablets by mouth as needed for heartburn.    . nystatin cream (MYCOSTATIN) Apply 1  application topically 2 (two) times daily as needed for dry skin.     . rosuvastatin (CRESTOR) 20 MG tablet Take 10 mg by mouth at bedtime.      No current facility-administered medications for this visit.     Review of Systems  Constitutional: Negative for chills, fatigue, fever and unexpected weight change.  HENT: Negative for trouble swallowing.  Eyes: Negative for loss of vision.  Respiratory: Negative for cough, shortness of breath and wheezing.  Cardiovascular: Negative for chest pain, leg swelling, palpitations and syncope.  GI: Negative for abdominal pain, blood in stool, diarrhea, nausea and vomiting.  GU: Negative for difficulty urinating, dysuria, frequency and hematuria.  Musculoskeletal: Negative for back pain, leg pain and joint pain.  Skin: Negative for rash.  Neurological: Negative for dizziness, headaches, light-headedness, numbness and seizures.  Psychiatric: Negative for behavioral problem, confusion, depressed mood and sleep disturbance.        Objective:  Objective   Vitals:   09/16/18 0807  BP: (!) 144/88  Weight: 178 lb (80.7 kg)  Height: 5' 6.5" (1.689 m)   Body mass index is 28.3 kg/m.  Physical Exam Vitals signs and nursing note reviewed.  Constitutional:      Appearance: She is well-developed.  HENT:     Head: Normocephalic and atraumatic.  Eyes:     Pupils: Pupils are equal, round, and reactive to light.  Cardiovascular:     Rate and Rhythm: Normal rate and regular rhythm.  Pulmonary:     Effort: Pulmonary effort is normal. No respiratory distress.  Genitourinary:    General: Normal vulva.     Vagina: No vaginal discharge.     Rectum: Normal.     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 Normal vulva Normal cervix.  No vaginal discharge present No evidence of cervical polyps Urethral diverticulum present Skin:    General: Skin is warm and dry.  Neurological:     Mental Status: She is alert and oriented to  person, place, and time.  Psychiatric:        Behavior: Behavior normal.        Thought Content: Thought content normal.        Judgment: Judgment normal.         Assessment/Plan:    77 yo with postmenopausal bleeding Will have her return in 1 week for Transabdominal US to evaluate uterine lining.  UA sent to evaluate for hematuria.      More than 30 minutes were spent face to face with the patient in the room with more than 50% of the time spent providing counseling and discussing the plan of management.  Adrian Prows MD [facility] 09/16/18 8:20 AM

## 2018-09-17 LAB — URINALYSIS, ROUTINE W REFLEX MICROSCOPIC
Bilirubin, UA: NEGATIVE
Glucose, UA: NEGATIVE
Ketones, UA: NEGATIVE
Leukocytes, UA: NEGATIVE
Nitrite, UA: NEGATIVE
Protein, UA: NEGATIVE
RBC, UA: NEGATIVE
Specific Gravity, UA: 1.006 (ref 1.005–1.030)
Urobilinogen, Ur: 0.2 mg/dL (ref 0.2–1.0)
pH, UA: 7 (ref 5.0–7.5)

## 2018-09-20 ENCOUNTER — Encounter: Payer: Self-pay | Admitting: Family Medicine

## 2018-09-20 ENCOUNTER — Ambulatory Visit (INDEPENDENT_AMBULATORY_CARE_PROVIDER_SITE_OTHER): Payer: Medicare Other | Admitting: Family Medicine

## 2018-09-20 VITALS — BP 138/78 | HR 74 | Temp 97.8°F | Ht 66.5 in | Wt 177.8 lb

## 2018-09-20 DIAGNOSIS — I10 Essential (primary) hypertension: Secondary | ICD-10-CM | POA: Diagnosis not present

## 2018-09-20 DIAGNOSIS — M25512 Pain in left shoulder: Secondary | ICD-10-CM | POA: Diagnosis not present

## 2018-09-20 DIAGNOSIS — E782 Mixed hyperlipidemia: Secondary | ICD-10-CM | POA: Diagnosis not present

## 2018-09-20 DIAGNOSIS — I441 Atrioventricular block, second degree: Secondary | ICD-10-CM

## 2018-09-20 DIAGNOSIS — M25511 Pain in right shoulder: Secondary | ICD-10-CM

## 2018-09-20 DIAGNOSIS — K219 Gastro-esophageal reflux disease without esophagitis: Secondary | ICD-10-CM | POA: Diagnosis not present

## 2018-09-20 LAB — COMPREHENSIVE METABOLIC PANEL WITH GFR
ALT: 18 U/L (ref 0–35)
AST: 17 U/L (ref 0–37)
Albumin: 4.2 g/dL (ref 3.5–5.2)
Alkaline Phosphatase: 78 U/L (ref 39–117)
BUN: 13 mg/dL (ref 6–23)
CO2: 28 meq/L (ref 19–32)
Calcium: 9.3 mg/dL (ref 8.4–10.5)
Chloride: 106 meq/L (ref 96–112)
Creatinine, Ser: 0.68 mg/dL (ref 0.40–1.20)
GFR: 88.74 mL/min
Glucose, Bld: 92 mg/dL (ref 70–99)
Potassium: 4.3 meq/L (ref 3.5–5.1)
Sodium: 140 meq/L (ref 135–145)
Total Bilirubin: 0.6 mg/dL (ref 0.2–1.2)
Total Protein: 7.1 g/dL (ref 6.0–8.3)

## 2018-09-20 LAB — LDL CHOLESTEROL, DIRECT: Direct LDL: 166 mg/dL

## 2018-09-20 MED ORDER — VALSARTAN 80 MG PO TABS: 80.0000 mg | ORAL_TABLET | Freq: Every day | ORAL | 3 refills | Status: DC

## 2018-09-20 MED ORDER — PANTOPRAZOLE SODIUM 40 MG PO TBEC: 40.0000 mg | DELAYED_RELEASE_TABLET | Freq: Every day | ORAL | 3 refills | Status: DC

## 2018-09-20 NOTE — Assessment & Plan Note (Signed)
Adequately controlled for age.  Continue current medication. 

## 2018-09-20 NOTE — Assessment & Plan Note (Addendum)
Seems to have improved on Protonix.  Does still have some symptoms that occur rarely.  She does report a fullness in her lower chest when she bends forward which could be consistent with a hiatal hernia.  We will obtain a barium swallow to evaluate.  Consider GI referral depending on results.  Continue Protonix.

## 2018-09-20 NOTE — Progress Notes (Signed)
Tommi Rumps, MD Phone: [phone]  Alison Huffman is a 77 y.o. female who presents today for f/u.  CC: GERD, htn, hld, shoulder pain  GERD: Taking Protonix.  This has helped with indigestion symptoms.  She notes no reflux or regurgitation.  No abdominal pain.  No blood in her stool.  No dysphagia.  She does note occasional belching though no pain associated with this.  She does note a fullness in her lower chest if she bends forward or over.  Hypertension: Taking valsartan.  No chest pain, shortness of breath, or edema.  Hyperlipidemia: She is no longer taking Crestor.  She notes no abdominal pain.  She does note when she was on the Crestor 10 mg daily she had some muscle aches in her legs.  Shoulder pain: She was evaluated by our nurse practitioner several weeks ago.  She had x-rays that revealed degenerative changes.  She does note her pain has improved since then.  She is going to start physical therapy after Christmas.  Second-degree Mobitz type II heart block: Patient notes she feels significantly better since having her pacemaker placed.  Her dyspnea on exertion has improved quite a bit.  She follows up with Dr. Rayann Heman in January.  Social History   Tobacco Use  Smoking Status Former Smoker  Smokeless Tobacco Never Used  Tobacco Comment   07/22/2018 "smoked some in college; 1960s;  nothing since"     ROS see history of present illness  Objective  Physical Exam Vitals:   09/20/18 1014  BP: 138/78  Pulse: 74  Temp: 97.8 F (36.6 C)  SpO2: 96%    BP Readings from Last 3 Encounters:  09/20/18 138/78  09/16/18 (!) 144/88  09/06/18 138/84   Wt Readings from Last 3 Encounters:  09/20/18 177 lb 12.8 oz (80.6 kg)  09/16/18 178 lb (80.7 kg)  09/06/18 179 lb 9.6 oz (81.5 kg)    Physical Exam Constitutional:      General: She is not in acute distress.    Appearance: She is not diaphoretic.  Cardiovascular:     Rate and Rhythm: Normal rate and regular rhythm.      Heart sounds: Normal heart sounds.  Pulmonary:     Effort: Pulmonary effort is normal.     Breath sounds: Normal breath sounds.  Abdominal:     General: Bowel sounds are normal. There is no distension.     Palpations: Abdomen is soft.     Tenderness: There is no abdominal tenderness. There is no guarding or rebound.  Musculoskeletal:     Right lower leg: No edema.     Left lower leg: No edema.  Skin:    General: Skin is warm and dry.  Neurological:     Mental Status: She is alert.      Assessment/Plan: Please see individual problem list.  Second degree AV block, Mobitz type II Seems to be improved.  She will keep her appointment with cardiologist.  GERD (gastroesophageal reflux disease) Seems to have improved on Protonix.  Does still have some symptoms that occur rarely.  She does report a fullness in her lower chest when she bends forward which could be consistent with a hiatal hernia.  We will obtain a barium swallow to evaluate.  Consider GI referral depending on results.  Continue Protonix.  Hyperlipidemia Check lab work.  Discussed potentially restarting Crestor depending on labs.  Hypertension Adequately controlled for age.  Continue current medication.  Bilateral shoulder pain This has improved to  some degree since seeing the nurse practitioner.  She will complete physical therapy as planned.   Orders Placed This Encounter  Procedures  . DG Esophagus    Standing Status:   Future    Standing Expiration Date:   11/22/2019    Order Specific Question:   Reason for Exam (SYMPTOM  OR DIAGNOSIS REQUIRED)    Answer:   GERD, sensation of fullness in lower chest when bending over    Order Specific Question:   Preferred imaging location?    Answer:   [facility]    Order Specific Question:   Radiology Contrast Protocol - do NOT remove file path    Answer:   [email or web address]  . Comp Met (CMET)  . Direct LDL    Meds  ordered this encounter  Medications  . valsartan (DIOVAN) 80 MG tablet    Sig: Take 1 tablet (80 mg total) by mouth daily.    Dispense:  90 tablet    Refill:  3  . pantoprazole (PROTONIX) 40 MG tablet    Sig: Take 1 tablet (40 mg total) by mouth daily.    Dispense:  90 tablet    Refill:  [place], MD [place]

## 2018-09-20 NOTE — Patient Instructions (Signed)
Nice to see you. We will get lab work today.  

## 2018-09-20 NOTE — Assessment & Plan Note (Signed)
This has improved to some degree since seeing the nurse practitioner.  She will complete physical therapy as planned.

## 2018-09-20 NOTE — Assessment & Plan Note (Addendum)
Check lab work.  Discussed potentially restarting Crestor depending on labs.

## 2018-09-20 NOTE — Assessment & Plan Note (Signed)
Seems to be improved.  She will keep her appointment with cardiologist.

## 2018-09-26 ENCOUNTER — Other Ambulatory Visit: Payer: Medicare Other

## 2018-09-30 ENCOUNTER — Other Ambulatory Visit: Payer: Self-pay | Admitting: Obstetrics and Gynecology

## 2018-09-30 ENCOUNTER — Telehealth: Payer: Self-pay | Admitting: Obstetrics and Gynecology

## 2018-09-30 ENCOUNTER — Ambulatory Visit (INDEPENDENT_AMBULATORY_CARE_PROVIDER_SITE_OTHER): Payer: Medicare Other | Admitting: Obstetrics and Gynecology

## 2018-09-30 ENCOUNTER — Encounter: Payer: Self-pay | Admitting: Obstetrics and Gynecology

## 2018-09-30 ENCOUNTER — Ambulatory Visit (INDEPENDENT_AMBULATORY_CARE_PROVIDER_SITE_OTHER): Payer: Medicare Other

## 2018-09-30 VITALS — BP 122/70 | Wt 176.0 lb

## 2018-09-30 DIAGNOSIS — N950 Postmenopausal bleeding: Secondary | ICD-10-CM

## 2018-09-30 DIAGNOSIS — N83202 Unspecified ovarian cyst, left side: Secondary | ICD-10-CM

## 2018-09-30 DIAGNOSIS — R9389 Abnormal findings on diagnostic imaging of other specified body structures: Secondary | ICD-10-CM

## 2018-09-30 NOTE — Telephone Encounter (Signed)
-----   Message from Homero Fellers, MD sent at 09/30/2018 12:05 PM EST ----- Regarding: surgery schedule Surgery Booking Request Patient Full Name:  Alison Huffman  MRN: [id number]  DOB: Feb 07, 1941  Surgeon: Homero Fellers, MD  Requested Surgery Date and Time: 10/08/17 or 10/17/17 preference  Primary Diagnosis AND Code: postmenopausal bleeding, thickened endometrium Secondary Diagnosis and Code:  Surgical Procedure: hysteroscopy dilation and curettage L&D Notification: No Admission Status: same day surgery Length of Surgery: 1 hour Special Case Needs: none H&P: not needed (date) Phone Interview???: no Interpreter: Language:  Medical Clearance: yes Special Scheduling Instructions: has a Psychologist, forensic

## 2018-09-30 NOTE — Progress Notes (Signed)
Patient ID: Alison Huffman, female   DOB: Apr 15, 1941, 77 y.o.   MRN: [id number]  Reason for Consult: Follow-up (U/S follow up )   Referred by Leone Haven, MD  Subjective:     HPI:  Alison Huffman is a 77 y.o. female . She is returning today for an [place] to evaluate a one time episode of postmenopausal bleeding. Her endometrium is thickened at 8.46mm and she was found to have a left sided ovarian cyst. She has had not further vaginal bleeding.   Past Medical History:  Diagnosis Date  . GERD (gastroesophageal reflux disease)   . Headache    "dull one sometimes daily, at least weekly in last couple months" (07/22/2018)  . Hyperlipidemia   . Hypertension   . 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 Lichen sclerosus   . Osteopenia   . Pneumonia 2012? X 1  . Psoriasis   . Psoriatic arthritis ([place])    " dx'd the 1st of this year, 2019" (07/22/2018)  . Rosacea   . Seborrheic keratosis   . Sleep apnea    "using nasal pilllows since ~ 12/2017" (07/22/2018)   Family History  Problem Relation Age of Onset  . Stroke Mother   . Atrial fibrillation Mother   . Breast cancer Mother   . Stroke Father   . Breast cancer Maternal Aunt   . Breast cancer Paternal Aunt    Past Surgical History:  Procedure Laterality Date  . APPENDECTOMY    . BREAST BIOPSY Left 1978   "benign"  . BREAST BIOPSY Bilateral    "several; all benign" (07/22/2018)  . PACEMAKER IMPLANT N/A 07/23/2018   Procedure: PACEMAKER IMPLANT;  Surgeon: Thompson Grayer, MD;  Location: [facility];  Service: Cardiovascular;  Laterality: N/A;    Short Social History:  Social History   Tobacco Use  . Smoking status: Former Research scientist (life sciences)  . Smokeless tobacco: Never Used  . Tobacco comment: 07/22/2018 "smoked some in college; 1960s;  nothing since"  Substance Use Topics  . Alcohol use: Yes    Comment: 07/22/2018 "nothing since 11/2017; did drink 1 glass of wine/wk before that"    No Known Allergies  Current Outpatient  Medications  Medication Sig Dispense Refill  . cholecalciferol (VITAMIN D) 1000 units tablet Take 2,000 Units by mouth daily.     . Clobetasol Prop Emollient Base (CLOBETASOL PROPIONATE E) 0.05 % emollient cream Apply topically every Sunday.     [name] desonide (DESOWEN) 0.05 % ointment Apply 1 application topically 2 (two) times daily as needed (for psoriasis).     . folic acid (FOLVITE) 1 MG tablet Take 1 mg by mouth daily.    [name] HUMIRA PEN 40 MG/0.4ML PNKT Inject 0.4 mLs into the skin every 14 (fourteen) days.    . metroNIDAZOLE (METROCREAM) 0.75 % cream Apply topically 2 (two) times daily as needed (for infection).     . nystatin cream (MYCOSTATIN) Apply 1 application topically 2 (two) times daily as needed for dry skin.     . pantoprazole (PROTONIX) 40 MG tablet Take 1 tablet (40 mg total) by mouth daily. 90 tablet 3  . rosuvastatin (CRESTOR) 20 MG tablet Take 10 mg by mouth at bedtime. Mon, Wed, and Friday    . tacrolimus (PROTOPIC) 0.1 % ointment Apply topically 2 (two) times daily as needed (for psoriasis).     . valsartan (DIOVAN) 80 MG tablet Take 1 tablet (80 mg total) by mouth daily. 90 tablet 3  .  acetaminophen (TYLENOL) 325 MG tablet Take 325-650 mg by mouth every 6 (six) hours as needed for mild pain or headache.     . calcium carbonate (TUMS EX) 750 MG chewable tablet Chew 2 tablets by mouth as needed for heartburn.    . cyclobenzaprine (FLEXERIL) 5 MG tablet Take 1 tablet (5 mg total) by mouth at bedtime as needed for muscle spasms. (Patient not taking: Reported on 09/30/2018) 15 tablet 0  . diclofenac sodium (VOLTAREN) 1 % GEL Apply 4 g topically 4 (four) times daily. (Patient not taking: Reported on 09/30/2018) 1 Tube 3  . lidocaine (LIDODERM) 5 % Place 1 patch onto the skin daily. Remove & Discard patch within 12 hours. (Patient not taking: Reported on 09/30/2018) 30 patch 0   No current facility-administered medications for this visit.     Review of Systems  Constitutional:  Negative for chills, fatigue, fever and unexpected weight change.  HENT: Negative for trouble swallowing.  Eyes: Negative for loss of vision.  Respiratory: Negative for cough, shortness of breath and wheezing.  Cardiovascular: Negative for chest pain, leg swelling, palpitations and syncope.  GI: Negative for abdominal pain, blood in stool, diarrhea, nausea and vomiting.  GU: Negative for difficulty urinating, dysuria, frequency and hematuria.  Musculoskeletal: Negative for back pain, leg pain and joint pain.  Skin: Negative for rash.  Neurological: Negative for dizziness, headaches, light-headedness, numbness and seizures.  Psychiatric: Negative for behavioral problem, confusion, depressed mood and sleep disturbance.        Objective:  Objective   Vitals:   09/30/18 1121  BP: 122/70  Weight: 176 lb (79.8 kg)   Body mass index is 27.98 kg/m.  Physical Exam Vitals signs and nursing note reviewed.  Constitutional:      Appearance: She is well-developed.  HENT:     Head: Normocephalic and atraumatic.  Eyes:     Pupils: Pupils are equal, round, and reactive to light.  Cardiovascular:     Rate and Rhythm: Normal rate and regular rhythm.  Pulmonary:     Effort: Pulmonary effort is normal. No respiratory distress.  Abdominal:     General: Abdomen is flat.     Palpations: Abdomen is soft.  Skin:    General: Skin is warm and dry.  Neurological:     Mental Status: She is alert and oriented to person, place, and time.  Psychiatric:        Behavior: Behavior normal.        Thought Content: Thought content normal.        Judgment: Judgment normal.          Assessment/Plan:     77 yo with postmenopausal bleeding and thickened endometrium, Will schedule for D&C hysteroscopy to evaluate and sample the endometrium. Risks and benefits discussed with patient today in office.  Left ovarian cyst with hypoechoic appearance, ROMA ordered.   More than 20 minutes were spent face to  face with the patient in the room with more than 50% of the time spent providing counseling and discussing the plan of management.      Adrian Prows MD [facility] 09/30/2018 11:48 AM

## 2018-09-30 NOTE — Telephone Encounter (Signed)
Patient is was offered dates below. Patient is aware of H&P and consents on day of surgery, Pre-admit Testing to be scheduled (10/14/18 or 10/15/18 requested), and OR on 10/22/18. Patient is aware she needs medical clearance and confirmed Dr Caryl Bis is her pcp. Patient is aware she may receive calls from the [facility]. Patient confirmed Medicare, and secondary T[employer]are. My phone# and ext were given.

## 2018-10-01 ENCOUNTER — Other Ambulatory Visit: Payer: Self-pay | Admitting: Family Medicine

## 2018-10-01 ENCOUNTER — Ambulatory Visit
Admission: RE | Admit: 2018-10-01 | Discharge: 2018-10-01 | Disposition: A | Discharge status: Home / Self Care | Payer: Medicare Other | Source: Ambulatory Visit | Attending: Family Medicine | Admitting: Family Medicine

## 2018-10-01 DIAGNOSIS — E785 Hyperlipidemia, unspecified: Secondary | ICD-10-CM

## 2018-10-01 DIAGNOSIS — K219 Gastro-esophageal reflux disease without esophagitis: Secondary | ICD-10-CM | POA: Diagnosis not present

## 2018-10-01 DIAGNOSIS — K449 Diaphragmatic hernia without obstruction or gangrene: Secondary | ICD-10-CM | POA: Diagnosis not present

## 2018-10-01 LAB — POSTMENOPAUSAL INTERP: LOW

## 2018-10-01 LAB — PREMENOPAUSAL INTERP: HIGH

## 2018-10-01 LAB — OVARIAN MALIGNANCY RISK-ROMA
Cancer Antigen (CA) 125: 8.5 U/mL (ref 0.0–38.1)
HE4: 70.9 pmol/L (ref 0.0–96.9)
Postmenopausal ROMA: 1.1
Premenopausal ROMA: 1.51 — ABNORMAL HIGH

## 2018-10-01 NOTE — Telephone Encounter (Signed)
Thank you for letting me know. We will look for her clearance form. Given that she has the pacemaker I would advise clearance from cardiology as well. I believe Dr Rayann Heman placed her pacemaker.

## 2018-10-03 DIAGNOSIS — M25511 Pain in right shoulder: Secondary | ICD-10-CM | POA: Diagnosis not present

## 2018-10-03 DIAGNOSIS — M6281 Muscle weakness (generalized): Secondary | ICD-10-CM | POA: Diagnosis not present

## 2018-10-03 DIAGNOSIS — M25512 Pain in left shoulder: Secondary | ICD-10-CM | POA: Diagnosis not present

## 2018-10-03 NOTE — Progress Notes (Signed)
Released to mychart

## 2018-10-03 NOTE — Telephone Encounter (Signed)
Cardiac clearance request faxed to Dr Rayann Heman at [place], [place].

## 2018-10-04 ENCOUNTER — Telehealth: Payer: Self-pay | Admitting: *Deleted

## 2018-10-04 ENCOUNTER — Telehealth: Payer: Self-pay | Admitting: Internal Medicine

## 2018-10-04 DIAGNOSIS — L409 Psoriasis, unspecified: Secondary | ICD-10-CM | POA: Diagnosis not present

## 2018-10-04 DIAGNOSIS — L4050 Arthropathic psoriasis, unspecified: Secondary | ICD-10-CM | POA: Diagnosis not present

## 2018-10-04 DIAGNOSIS — Z79899 Other long term (current) drug therapy: Secondary | ICD-10-CM | POA: Diagnosis not present

## 2018-10-04 NOTE — Telephone Encounter (Signed)
Pt calling today to discuss pre op clearance, increase SOB with activity, and movement restrictions.   Pt is set up to see Dr Rayann Heman 1/15 for a pre operative clearance per [facility] request.   Pt also is having increased SOB when she increases the speed of her walking. She states she feels like she "can't keep up." She denies any swelling or weight gain. I advised pt to speak with Dr Rayann Heman regarding this during her pre operative appointment.   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 She was referred to physical therapy and needed to make sure she had no movement or weight lifting restrictions. I advised pt at this time, she had no restrictions.  Pt has verbalized understanding and agreed to the above and will call with any questions.

## 2018-10-04 NOTE — Telephone Encounter (Signed)
   [facility] Pre-operative Risk Assessment    Request for surgical clearance:  1. What type of surgery is being performed? HYSTEROSCOPY D&C   2. When is this surgery scheduled? 10/22/18   3. What type of clearance is required (medical clearance vs. Pharmacy clearance to hold med vs. Both)? MEDICAL/CARDIAC CLEARANCE   4. Are there any medications that need to be held prior to surgery and how long? NO ANTICOAGS ON MED LIST   5. Practice name and name of physician performing surgery? [facility] ; DR. Renold Don SCHUMAN  6. What is your office phone number [phone]    7.   What is your office fax number [phone]  8.   Anesthesia type (None, local, MAC, general) ? CHOICE   Julaine Hua 10/04/2018, 8:19 AM  _________________________________________________________________   (provider comments below)

## 2018-10-04 NOTE — Telephone Encounter (Signed)
Returned call to [facility], to let her know the anesthesiologist would decide the type of anesthesia on the morning of surgery (per Caryl Pina @ Pre-admit).

## 2018-10-04 NOTE — Telephone Encounter (Signed)
Pt contacted and complained of DOE. She needs to be seen before her D&C. Please arrange an OV with Dr Rayann Heman or his APP at [facility] and coordinate it with a pacer check.  Her D&C is 10/22/18 so please arrange before then.  Kerin Ransom PA-C 10/04/2018 10:55 AM

## 2018-10-04 NOTE — Telephone Encounter (Signed)
PT HAS APPT SCHEDULED ALLRED 1-15 @ 3PM FOR CARDIAC CLEARANCE PER LK SHE CAN BE CLEARED AT THAT TIME

## 2018-10-04 NOTE — Telephone Encounter (Signed)
New Message   Pt has Pacemaker and wants to know if she is restricted in any amount of activity she does. She is scheduled for a [facility] on Jan 21st at [facility] and wants to know if its okay to have procedure. She is also having shoulder pains. Please call

## 2018-10-07 ENCOUNTER — Ambulatory Visit
Admission: RE | Admit: 2018-10-07 | Discharge: 2018-10-07 | Disposition: A | Discharge status: Home / Self Care | Payer: Medicare Other | Source: Ambulatory Visit | Attending: Family Medicine | Admitting: Family Medicine

## 2018-10-07 DIAGNOSIS — M6281 Muscle weakness (generalized): Secondary | ICD-10-CM | POA: Diagnosis not present

## 2018-10-07 DIAGNOSIS — M25511 Pain in right shoulder: Secondary | ICD-10-CM | POA: Diagnosis not present

## 2018-10-07 DIAGNOSIS — M810 Age-related osteoporosis without current pathological fracture: Secondary | ICD-10-CM | POA: Insufficient documentation

## 2018-10-07 DIAGNOSIS — M25512 Pain in left shoulder: Secondary | ICD-10-CM | POA: Diagnosis not present

## 2018-10-08 ENCOUNTER — Other Ambulatory Visit: Payer: Self-pay | Admitting: Family Medicine

## 2018-10-08 DIAGNOSIS — K219 Gastro-esophageal reflux disease without esophagitis: Secondary | ICD-10-CM

## 2018-10-10 ENCOUNTER — Ambulatory Visit (INDEPENDENT_AMBULATORY_CARE_PROVIDER_SITE_OTHER): Payer: Medicare Other | Admitting: Gastroenterology

## 2018-10-10 ENCOUNTER — Encounter: Payer: Self-pay | Admitting: Gastroenterology

## 2018-10-10 VITALS — BP 136/63 | HR 76 | Ht 66.5 in | Wt 177.4 lb

## 2018-10-10 DIAGNOSIS — K219 Gastro-esophageal reflux disease without esophagitis: Secondary | ICD-10-CM

## 2018-10-10 NOTE — Progress Notes (Signed)
Alison Huffman [address]  [place]  [place], [place] [address]  Main: [phone]  Fax: [phone]   Gastroenterology Consultation  Referring Provider:     Leone Haven, MD Primary Care Physician:  Alison Haven, MD Primary Gastroenterologist:  Dr. Vonda Huffman Reason for Consultation:     GERD        HPI:     Chief complaint: Belching  Alison Huffman is a 78 y.o. y/o female referred for consultation & management  by Dr. Caryl Huffman, Alison Adam, MD.  Patient had complained of GERD-like symptoms to her PCP and she was started on Protonix which according to PCP notes had improved.  She had also reported fullness in her lower chest on bending forward.  Esophagram obtained by PCP reported a small hiatal hernia and moderate gastroesophageal reflux. The patient denies abdominal or flank pain, anorexia, nausea or vomiting, dysphagia, change in bowel habits or black or bloody stools or weight loss.  Patient reports frequent belching since October 2019, initially antireflux medications helped, but symptoms have returned.  Denies any heartburn, or metallic taste in mouth.  Patient was also started on CPAP in July 2019.  She does not use a straw to drink liquids.  No prior history of upper endoscopy.  Patient had a colonoscopy in October 2016 by Dr. Ottis Huffman and the impression was normal colon. due to her age no further screening colonoscopies were recommended.  Past Medical History:  Diagnosis Date  . GERD (gastroesophageal reflux disease)   . Headache    "dull one sometimes daily, at least weekly in last couple months" (07/22/2018)  . Hyperlipidemia   . Hypertension   . 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 Lichen sclerosus   . Osteopenia   . Pneumonia 2012? X 1  . Psoriasis   . Psoriatic arthritis ([place])    " dx'd the 1st of this year, 2019" (07/22/2018)  . Rosacea   . Seborrheic keratosis   . Sleep apnea    "using nasal pilllows since ~ 12/2017" (07/22/2018)      Past Surgical History:  Procedure Laterality Date  . APPENDECTOMY    . BREAST BIOPSY Left 1978   "benign"  . BREAST BIOPSY Bilateral    "several; all benign" (07/22/2018)  . PACEMAKER IMPLANT N/A 07/23/2018   Procedure: PACEMAKER IMPLANT;  Surgeon: Thompson Grayer, MD;  Location: [facility];  Service: Cardiovascular;  Laterality: N/A;    Prior to Admission medications   Medication Sig Start Date End Date Taking? Authorizing Provider  acetaminophen (TYLENOL) 325 MG tablet Take 325-650 mg by mouth every 6 (six) hours as needed for mild pain or headache.     [provider]  calcium carbonate (TUMS EX) 750 MG chewable tablet Chew 2 tablets by mouth as needed for heartburn.    [provider]  Cholecalciferol (VITAMIN D3) 50 MCG (2000 UT) TABS Take 2,000 Units by mouth daily.    [provider]  Clobetasol Prop Emollient Base (CLOBETASOL PROPIONATE E) 0.05 % emollient cream Apply 1 application topically every Sunday.     [provider]  cyclobenzaprine (FLEXERIL) 5 MG tablet Take 1 tablet (5 mg total) by mouth at bedtime as needed for muscle spasms. Patient not taking: Reported on 09/30/2018 09/06/18   Alison Hawthorne, FNP  desonide (DESOWEN) 0.05 % ointment Apply 1 application topically 2 (two) times daily as needed (for psoriasis).     [provider]  diclofenac sodium (VOLTAREN) 1 %  GEL Apply 4 g topically 4 (four) times daily. Patient not taking: Reported on 09/30/2018 09/06/18   Alison Hawthorne, FNP  folic acid (FOLVITE) 1 MG tablet Take 1 mg by mouth daily. 12/10/17 12/10/18  [provider]  HUMIRA PEN 40 MG/0.4ML PNKT Inject 0.4 mLs into the skin every 14 (fourteen) days.    [provider]  lidocaine (LIDODERM) 5 % Place 1 patch onto the skin daily. Remove & Discard patch within 12 hours. Patient not taking: Reported on 09/30/2018 09/06/18   Alison Hawthorne, FNP  metroNIDAZOLE (METROCREAM) 0.75 % cream  Apply topically 2 (two) times daily as needed (facial redness/irritation.).     [provider]  nystatin cream (MYCOSTATIN) Apply 1 application topically 2 (two) times daily as needed for dry skin.     [provider]  pantoprazole (PROTONIX) 40 MG tablet Take 1 tablet (40 mg total) by mouth daily. Patient taking differently: Take 40 mg by mouth daily before breakfast. 30 minutes prior to breakfast 09/20/18   Alison Haven, MD  rosuvastatin (CRESTOR) 20 MG tablet Take 10 mg by mouth every Monday, Wednesday, and Friday.     [provider]  tacrolimus (PROTOPIC) 0.1 % ointment Apply topically 2 (two) times daily as needed (for psoriasis).     [provider]  valsartan (DIOVAN) 80 MG tablet Take 1 tablet (80 mg total) by mouth daily. 09/20/18   Alison Haven, MD    Family History  Problem Relation Age of Onset  . Stroke Mother   . Atrial fibrillation Mother   . Breast cancer Mother   . Stroke Father   . Breast cancer Maternal Aunt   . Breast cancer Paternal Aunt      Social History   Tobacco Use  . Smoking status: Former Research scientist (life sciences)  . Smokeless tobacco: Never Used  . Tobacco comment: 07/22/2018 "smoked some in college; 1960s;  nothing since"  Substance Use Topics  . Alcohol use: Yes    Comment: 07/22/2018 "nothing since 11/2017; did drink 1 glass of wine/wk before that"  . Drug use: Never    Allergies as of 10/10/2018  . (No Known Allergies)    Review of Systems:    All systems reviewed and negative except where noted in HPI.   Physical Exam:   Vitals:   10/10/18 1030  BP: 136/63  Pulse: 76  Weight: 177 lb 6.4 oz (80.5 kg)  Height: 5' 6.5" (1.689 m)    No LMP recorded. Patient is postmenopausal. Psych:  Alert and cooperative. Normal mood and affect. General:   Alert,  Well-developed, well-nourished, pleasant and cooperative in NAD Head:  Normocephalic and atraumatic. Eyes:  Sclera clear, no icterus.   Conjunctiva  pink. Ears:  Normal auditory acuity. Nose:  No deformity, discharge, or lesions. Mouth:  No deformity or lesions,oropharynx pink & moist. Neck:  Supple; no masses or thyromegaly. Abdomen:  Normal bowel sounds.  No bruits.  Soft, non-tender and non-distended without masses, hepatosplenomegaly or hernias noted.  No guarding or rebound tenderness.    Msk:  Symmetrical without gross deformities. Good, equal movement & strength bilaterally. Pulses:  Normal pulses noted. Extremities:  No clubbing or edema.  No cyanosis. Neurologic:  Alert and oriented x3;  grossly normal neurologically. Skin:  Intact without significant lesions or rashes. No jaundice. Lymph Nodes:  No significant cervical adenopathy. Psych:  Alert and cooperative. Normal mood and affect.   Labs: CBC    Component Value Date/Time   WBC  7.2 07/26/2018 1045   RBC 4.33 07/26/2018 1045   HGB 13.5 07/26/2018 1045   HCT 42.8 07/26/2018 1045   PLT 242 07/26/2018 1045   MCV 98.8 07/26/2018 1045   MCH 31.2 07/26/2018 1045   MCHC 31.5 07/26/2018 1045   RDW 12.7 07/26/2018 1045   CMP     Component Value Date/Time   NA 140 09/20/2018 1052   K 4.3 09/20/2018 1052   CL 106 09/20/2018 1052   CO2 28 09/20/2018 1052   GLUCOSE 92 09/20/2018 1052   BUN 13 09/20/2018 1052   CREATININE 0.68 09/20/2018 1052   CALCIUM 9.3 09/20/2018 1052   PROT 7.1 09/20/2018 1052   ALBUMIN 4.2 09/20/2018 1052   AST 17 09/20/2018 1052   ALT 18 09/20/2018 1052   ALKPHOS 78 09/20/2018 1052   BILITOT 0.6 09/20/2018 1052   GFRNONAA >60 07/26/2018 1045   GFRAA >60 07/26/2018 1045    Imaging Studies: US Pelvis Transvanginal Non-ob (tv Only)  Result Date: 09/30/2018 Patient Name: LECHELLE WRIGLEY DOB: Nov 19, 1940 MRN: [id number] ULTRASOUND REPORT Location: [facility] Date of Service: 09/30/2018 Indications: Postmenopausal bleeding Findings: The uterus is anteverted and measures 6.2 x 3.8 x 2.6cm. Echo texture is homogenous without evidence of focal  masses. The Endometrium/endocervix is thickened and heterogeneous measuring 8.6 mm and endocervical measuring 13.48mm. Right Ovary measures 1.9 x 1.4 x 1.1cm. It is normal in appearance. Left Ovary measures 2.3 x 2.2 x 1.8cm. Tissue appears abnormal. Questionable hypoechoic lesion 1.9cm. Does not appear cystic. Survey of the adnexa demonstrates no adnexal masses. There is no free fluid in the cul de sac. Impression: 1. Thickened and heterogeneous endometrium. 2. Abnormal appearing left ovary. Recommendations: 1.Clinical correlation with the patient's History and Physical Exam. Vita Barley, RDMS RVT I have reviewed this ultrasound and the report. I agree with the above assessment and plan. [facility] 09/30/18 11:43 AM    Dg Esophagus  Result Date: 10/01/2018 CLINICAL DATA:  Patient feels pressure when bending over and belching feeling. EXAM: ESOPHOGRAM / BARIUM SWALLOW / BARIUM TABLET STUDY TECHNIQUE: Combined double contrast and single contrast examination performed using effervescent crystals, thick barium liquid, and thin barium liquid. The patient was observed with fluoroscopy swallowing a 13 mm barium sulphate tablet. FLUOROSCOPY TIME:  Fluoroscopy Time:  0.7 minute Radiation Exposure Index (if provided by the fluoroscopic device): 3.4 mGy Number of Acquired Spot Images: 0 COMPARISON:  None. FINDINGS: There was normal pharyngeal anatomy and motility. Contrast flowed freely through the esophagus without evidence of stricture or mass. There was normal esophageal mucosa without evidence of irregularity or ulceration. Esophageal motility was normal. Small hiatal hernia. Moderate gastroesophageal reflux. At the end of the examination a 13 mm barium tablet was administered which transited through the esophagus and esophagogastric junction without delay. IMPRESSION: Small hiatal hernia. Moderate gastroesophageal reflux. Electronically Signed   By: Kathreen Devoid    On: 10/01/2018 09:05    Assessment and Plan:   Alison Huffman is a 78 y.o. y/o female has been referred for GERD  Patient's main symptoms is frequent belching and was found to have a hiatal hernia on esophagram Her belching is likely due to her hiatal hernia he can eat easier for air to come up through the LES. Her Protonix was helping previously but now she is having frequent belching and would like this evaluated We discussed risks and benefits of upper endoscopy and she would like to proceed with the  evaluation.  We did discuss that the procedure might be normal other than the finding of a hiatal hernia and she verbalized understanding and still would like to proceed with an EGD.  Her CPAP use could be causing her to swallow air throughout the night, and then belch all of this up during the day.  I have asked her to not drink liquids with a straw, and try not to swallow air inadvertently throughout the day as that can increase belching as well.  The PPI may or may not be helping her symptoms Based on the EGD findings, if it does not show any esophagitis or any other reason to continue PPI she is willing to try discontinuing it and if she has heartburn can try H2 RA instead.  (Risks of PPI use were discussed with patient including bone loss, C. Diff diarrhea, pneumonia, infections, CKD, electrolyte abnormalities.  If clinically possible based on symptoms, goal would be to maintain patient on the lowest dose possible, or discontinue the medication with institution of acid reflux lifestyle modifications over time. Pt. Verbalizes understanding and chooses to continue the medication.)   Dr Alison Huffman  Speech recognition software was used to dictate the above note.

## 2018-10-11 ENCOUNTER — Other Ambulatory Visit: Payer: Self-pay

## 2018-10-11 DIAGNOSIS — M25512 Pain in left shoulder: Secondary | ICD-10-CM | POA: Diagnosis not present

## 2018-10-11 DIAGNOSIS — M25511 Pain in right shoulder: Secondary | ICD-10-CM | POA: Diagnosis not present

## 2018-10-11 DIAGNOSIS — M6281 Muscle weakness (generalized): Secondary | ICD-10-CM | POA: Diagnosis not present

## 2018-10-11 DIAGNOSIS — K219 Gastro-esophageal reflux disease without esophagitis: Secondary | ICD-10-CM

## 2018-10-14 ENCOUNTER — Other Ambulatory Visit: Payer: Self-pay

## 2018-10-14 ENCOUNTER — Encounter
Admission: RE | Admit: 2018-10-14 | Discharge: 2018-10-14 | Disposition: A | Discharge status: Home / Self Care | Payer: Medicare Other | Source: Ambulatory Visit | Attending: Obstetrics and Gynecology | Admitting: Obstetrics and Gynecology

## 2018-10-14 DIAGNOSIS — Z01812 Encounter for preprocedural laboratory examination: Secondary | ICD-10-CM | POA: Insufficient documentation

## 2018-10-14 HISTORY — DX: Presence of cardiac pacemaker: Z95.0

## 2018-10-14 HISTORY — DX: Atrioventricular block, second degree: I44.1

## 2018-10-14 LAB — CBC
HCT: 41.2 % (ref 36.0–46.0)
Hemoglobin: 13.2 g/dL (ref 12.0–15.0)
MCH: 30.8 pg (ref 26.0–34.0)
MCHC: 32 g/dL (ref 30.0–36.0)
MCV: 96.3 fL (ref 80.0–100.0)
Platelets: 236 10*3/uL (ref 150–400)
RBC: 4.28 MIL/uL (ref 3.87–5.11)
RDW: 12.7 % (ref 11.5–15.5)
WBC: 5.4 10*3/uL (ref 4.0–10.5)
nRBC: 0 % (ref 0.0–0.2)

## 2018-10-14 LAB — TYPE AND SCREEN
ABO/RH(D): O POS
Antibody Screen: NEGATIVE

## 2018-10-14 NOTE — Patient Instructions (Addendum)
Your procedure is scheduled on: Tuesday, October 22, 2018 Report to Day Surgery on the 2nd floor of the [facility]. To find out your arrival time, please call [phone] between 1PM - 3PM on: Monday, October 21, 2018  REMEMBER: Instructions that are not followed completely may result in serious medical risk, up to and including death; or upon the discretion of your surgeon and anesthesiologist your surgery may need to be rescheduled.  Do not eat food after midnight the night before surgery.  No gum chewing, lozengers or hard candies.  You may however, drink CLEAR liquids up to 2 hours before you are scheduled to arrive for your surgery. Do not drink anything within 2 hours of the start of your surgery.  Clear liquids include: - water  - apple juice without pulp - gatorade - black coffee or tea (Do NOT add milk or creamers to the coffee or tea) Do NOT drink anything that is not on this list.  ENSURE PRE-SURGERY CARBOHYDRATE DRINK:  Complete drinking 3 HOURS prior to leaving for the hospital.   No Alcohol for 24 hours before or after surgery.  No Smoking including e-cigarettes for 24 hours prior to surgery.  No chewable tobacco products for at least 6 hours prior to surgery.  No nicotine patches on the day of surgery.  On the morning of surgery brush your teeth with toothpaste and water, you may rinse your mouth with mouthwash if you wish. Do not swallow any toothpaste or mouthwash.  Notify your doctor if there is any change in your medical condition (cold, fever, infection).  Do not wear jewelry, make-up, hairpins, clips or nail polish.  Do not wear lotions, powders, or perfumes.   Do not shave 48 hours prior to surgery.   Contacts and dentures may not be worn into surgery.  Do not bring valuables to the hospital, including drivers license, insurance or credit cards.  [facility] is not responsible for any belongings or valuables.   TAKE THESE MEDICATIONS THE MORNING  OF SURGERY:  1.  Pantoprazole - (take one the night before and one on the morning of surgery - helps to prevent nausea after surgery.)  Bring your C-PAP to the hospital with you in case you may have to spend the night.   NOW!  Stop Anti-inflammatories (NSAIDS) such as Advil, Aleve, Ibuprofen, Motrin, Naproxen, Naprosyn and Aspirin based products such as Excedrin, Goodys Powder, BC Powder. (May take Tylenol or Acetaminophen if needed.)  NOW!  Stop ANY OVER THE COUNTER supplements until after surgery. (May continue Vitamin D.)  Wear comfortable clothing (specific to your surgery type) to the hospital.  If you are being discharged the day of surgery, you will not be allowed to drive home. You will need a responsible adult to drive you home and stay with you that night.   If you are taking public transportation, you will need to have a responsible adult with you. Please confirm with your physician that it is acceptable to use public transportation.   Please call [phone] if you have any questions about these instructions.

## 2018-10-14 NOTE — Telephone Encounter (Signed)
Anesthesia also faxed a cardiac clearance request to Dr Jackalyn Lombard office. Patient is already scheduled w/ Dr Rayann Heman for 10/16/18.

## 2018-10-15 ENCOUNTER — Encounter: Payer: Self-pay | Admitting: Internal Medicine

## 2018-10-15 DIAGNOSIS — M6281 Muscle weakness (generalized): Secondary | ICD-10-CM | POA: Diagnosis not present

## 2018-10-15 DIAGNOSIS — M25512 Pain in left shoulder: Secondary | ICD-10-CM | POA: Diagnosis not present

## 2018-10-15 DIAGNOSIS — M25511 Pain in right shoulder: Secondary | ICD-10-CM | POA: Diagnosis not present

## 2018-10-16 ENCOUNTER — Ambulatory Visit (INDEPENDENT_AMBULATORY_CARE_PROVIDER_SITE_OTHER): Payer: Medicare Other | Admitting: Internal Medicine

## 2018-10-16 ENCOUNTER — Encounter: Payer: Self-pay | Admitting: Internal Medicine

## 2018-10-16 VITALS — BP 136/80 | HR 74 | Ht 65.5 in | Wt 178.8 lb

## 2018-10-16 DIAGNOSIS — I10 Essential (primary) hypertension: Secondary | ICD-10-CM | POA: Diagnosis not present

## 2018-10-16 DIAGNOSIS — Z950 Presence of cardiac pacemaker: Secondary | ICD-10-CM | POA: Diagnosis not present

## 2018-10-16 DIAGNOSIS — G4733 Obstructive sleep apnea (adult) (pediatric): Secondary | ICD-10-CM

## 2018-10-16 DIAGNOSIS — R0600 Dyspnea, unspecified: Secondary | ICD-10-CM

## 2018-10-16 DIAGNOSIS — I441 Atrioventricular block, second degree: Secondary | ICD-10-CM | POA: Diagnosis not present

## 2018-10-16 DIAGNOSIS — R0609 Other forms of dyspnea: Secondary | ICD-10-CM | POA: Diagnosis not present

## 2018-10-16 DIAGNOSIS — R0602 Shortness of breath: Secondary | ICD-10-CM | POA: Diagnosis not present

## 2018-10-16 NOTE — Progress Notes (Signed)
PCP: Leone Haven, MD Primary Cardiologist: Dr Rockey Situ Primary EP:  Dr Rayann Heman  Alison Huffman is a 78 y.o. female who presents today for routine electrophysiology followup.  Since her recent PP implant, the patient reports doing very well.  She is considering TAH and presents for CV evaluation.  She has SOB with moderate activity. Today, she denies symptoms of palpitations, chest pain,  lower extremity edema, dizziness, presyncope, or syncope.  The patient is otherwise without complaint today.   Past Medical History:  Diagnosis Date  . GERD (gastroesophageal reflux disease)   . Headache    "dull one sometimes daily, at least weekly in last couple months" (07/22/2018)  . Hyperlipidemia   . Hypertension   . 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 Lichen sclerosus   . Osteopenia   . Pneumonia 2012? X 1  . 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 Psoriasis   . Psoriatic arthritis ([place])    " dx'd the 1st of this year, 2019" (07/22/2018)  . Rosacea   . Seborrheic keratosis   . Second degree heart block   . Sleep apnea    "using nasal pilllows since ~ 12/2017" (07/22/2018)   Past Surgical History:  Procedure Laterality Date  . APPENDECTOMY    . BREAST BIOPSY Left 1978   "benign"  . BREAST CYST ASPIRATION Bilateral   . COLONOSCOPY    . PACEMAKER IMPLANT N/A 07/23/2018   SJM Assurity [id number] implanted by Dr Rayann Heman for mobitz II second degree AV block    ROS- all systems are reviewed and negative except as per HPI above  Current Outpatient Medications  Medication Sig Dispense Refill  . acetaminophen (TYLENOL) 325 MG tablet Take 325-650 mg by mouth every 6 (six) hours as needed for mild pain or headache.     . calcium carbonate (TUMS EX) 750 MG chewable tablet Chew 2 tablets by mouth as needed for heartburn.    . Cholecalciferol (VITAMIN D3) 50 MCG (2000 UT) TABS Take 2,000 Units by mouth daily.    . Clobetasol Prop Emollient Base (CLOBETASOL PROPIONATE E) 0.05 %  emollient cream Apply 1 application topically every Sunday.     [name] desonide (DESOWEN) 0.05 % ointment Apply 1 application topically 2 (two) times daily as needed (for psoriasis).     [name] diclofenac sodium (VOLTAREN) 1 % GEL Apply 4 g topically 4 (four) times daily. (Patient taking differently: Apply 4 g topically 4 (four) times daily as needed. ) 1 Tube 3  . HUMIRA PEN 40 MG/0.4ML PNKT Inject 0.4 mLs into the skin every 14 (fourteen) days.    [name] lidocaine (LIDODERM) 5 % Place 1 patch onto the skin daily. Remove & Discard patch within 12 hours. 30 patch 0  . metroNIDAZOLE (METROCREAM) 0.75 % cream Apply topically 2 (two) times daily as needed (facial redness/irritation.).     [name] nystatin cream (MYCOSTATIN) Apply 1 application topically 2 (two) times daily as needed for dry skin.     . pantoprazole (PROTONIX) 40 MG tablet Take 1 tablet (40 mg total) by mouth daily. (Patient taking differently: Take 40 mg by mouth daily before breakfast. 30 minutes prior to breakfast) 90 tablet 3  . rosuvastatin (CRESTOR) 20 MG tablet Take 10 mg by mouth every Monday, Wednesday, and Friday.     . tacrolimus (PROTOPIC) 0.1 % ointment Apply topically 2 (two) times daily as needed (for psoriasis).     . valsartan (DIOVAN) 80 MG tablet Take 1 tablet (80 mg  total) by mouth daily. 90 tablet 3   No current facility-administered medications for this visit.     Physical Exam: Vitals:   10/16/18 1501  BP: 136/80  Pulse: 74  SpO2: 98%  Weight: 178 lb 12.8 oz (81.1 kg)  Height: 5' 5.5" (1.664 m)    GEN- The patient is well appearing, alert and oriented x 3 today.   Head- normocephalic, atraumatic Eyes-  Sclera clear, conjunctiva pink Ears- hearing intact Oropharynx- clear Lungs- Clear to ausculation bilaterally, normal work of breathing Chest- pacemaker pocket is well healed Heart- Regular rate and rhythm, no murmurs, rubs or gallops, PMI not laterally displaced GI- soft, NT, ND, + BS Extremities- no clubbing,  cyanosis, or edema  Pacemaker interrogation- reviewed in detail today,  See PACEART report  ekg tracing ordered today is personally reviewed and shows sinus rhythm with RBBB  Assessment and Plan:  1. 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 atach Short episodes are noted on PPM Longest 8 minutes. Continue to follow  3. SOB Unclear etiology Recent echo reveals normal EF, grade 1 diastolic dysfunction myoview is ordered today to further evaluate SOB Follow-up with Dr Rockey Situ.  4. preop Would need further CV testing prior to [place] is ordered.  If low risk, ok to proceed with surery  5. OSA Compliance with CPAP encouraged  6. HTN Stable No change required today  Merlin Return to see EP NP in a year Follow-up with Dr Rockey Situ for SOB  Thompson Grayer MD, [facility] 10/16/2018 3:33 PM

## 2018-10-16 NOTE — Patient Instructions (Addendum)
Medication Instructions:  Your physician recommends that you continue on your current medications as directed. Please refer to the Current Medication list given to you today.  Labwork: None ordered.  Testing/Procedures: Your physician has requested that you have a lexiscan myoview. For further information please visit [email or web address]. Please follow instruction sheet, as given.  Please schedule for lexiscan myoview  Follow-Up:  Your physician wants you to follow-up in: next available after your stress test with Dr. Rockey Situ.  Your physician wants you to follow-up in: one year with Chanetta Marshall, NP.   You will receive a reminder letter in the mail two months in advance. If you don't receive a letter, please call our office to schedule the follow-up appointment.  Remote monitoring is used to monitor your Pacemaker from home. This monitoring reduces the number of office visits required to check your device to one time per year. It allows [place] to keep an eye on the functioning of your device to ensure it is working properly. You are scheduled for a device check from home on 01/15/2019. You may send your transmission at any time that day. If you have a wireless device, the transmission will be sent automatically. After your physician reviews your transmission, you will receive a postcard with your next transmission date.  Any Other Special Instructions Will Be Listed Below (If Applicable).  If you need a refill on your cardiac medications before your next appointment, please call your pharmacy.

## 2018-10-17 DIAGNOSIS — M25512 Pain in left shoulder: Secondary | ICD-10-CM | POA: Diagnosis not present

## 2018-10-17 DIAGNOSIS — M6281 Muscle weakness (generalized): Secondary | ICD-10-CM | POA: Diagnosis not present

## 2018-10-17 DIAGNOSIS — M25511 Pain in right shoulder: Secondary | ICD-10-CM | POA: Diagnosis not present

## 2018-10-17 LAB — CUP PACEART INCLINIC DEVICE CHECK
Battery Remaining Longevity: 140 mo
Battery Voltage: 3.04 V
Brady Statistic RA Percent Paced: 3.7 %
Brady Statistic RV Percent Paced: 2.7 %
Date Time Interrogation Session: 20200115210217
Implantable Lead Implant Date: 20191022
Implantable Lead Implant Date: 20191022
Implantable Lead Location: 753859
Implantable Lead Location: 753860
Implantable Pulse Generator Implant Date: 20191022
Lead Channel Impedance Value: 662.5 Ohm
Lead Channel Impedance Value: 762.5 Ohm
Lead Channel Pacing Threshold Amplitude: 0.5 V
Lead Channel Pacing Threshold Amplitude: 0.5 V
Lead Channel Pacing Threshold Amplitude: 0.5 V
Lead Channel Pacing Threshold Pulse Width: 0.5 ms
Lead Channel Pacing Threshold Pulse Width: 0.5 ms
Lead Channel Pacing Threshold Pulse Width: 0.5 ms
Lead Channel Sensing Intrinsic Amplitude: 12 mV
Lead Channel Sensing Intrinsic Amplitude: 4.7 mV
Lead Channel Setting Pacing Amplitude: 0.75 V
Lead Channel Setting Pacing Amplitude: 2 V
Lead Channel Setting Pacing Pulse Width: 0.5 ms
Lead Channel Setting Sensing Sensitivity: 2 mV
Pulse Gen Model: 2272
Pulse Gen Serial Number: 9073848

## 2018-10-18 NOTE — Telephone Encounter (Signed)
Patient is aware surgery is cancelled, and that we can reschedule once cardiac clearance is received.

## 2018-10-18 NOTE — Telephone Encounter (Signed)
Dr Rayann Heman ordered additional testing, which is scheduled for after the surgery date. Cancelled w/ Rip Harbour @ OR. Dr Gilman Schmidt is aware surgery is cancelled until clearance is received. Lmtrc.

## 2018-10-21 DIAGNOSIS — M6281 Muscle weakness (generalized): Secondary | ICD-10-CM | POA: Diagnosis not present

## 2018-10-21 DIAGNOSIS — M25512 Pain in left shoulder: Secondary | ICD-10-CM | POA: Diagnosis not present

## 2018-10-21 DIAGNOSIS — M25511 Pain in right shoulder: Secondary | ICD-10-CM | POA: Diagnosis not present

## 2018-10-22 ENCOUNTER — Ambulatory Visit
Admission: RE | Admit: 2018-10-22 | Payer: Medicare Other | Source: Home / Self Care | Admitting: Obstetrics and Gynecology

## 2018-10-22 ENCOUNTER — Encounter: Admission: RE | Payer: Self-pay | Source: Home / Self Care

## 2018-10-22 SURGERY — DILATATION & CURETTAGE/HYSTEROSCOPY WITH MYOSURE
Anesthesia: Choice

## 2018-10-24 ENCOUNTER — Other Ambulatory Visit: Payer: Medicare Other

## 2018-10-24 ENCOUNTER — Ambulatory Visit
Admission: RE | Admit: 2018-10-24 | Discharge: 2018-10-24 | Disposition: A | Discharge status: Home / Self Care | Payer: Medicare Other | Source: Ambulatory Visit | Attending: Internal Medicine | Admitting: Internal Medicine

## 2018-10-24 DIAGNOSIS — R0602 Shortness of breath: Secondary | ICD-10-CM

## 2018-10-24 LAB — NM MYOCAR MULTI W/SPECT W/WALL MOTION / EF
Estimated workload: 1 METS
Exercise duration (min): 0 min
Exercise duration (sec): 0 s
LV dias vol: 72 mL (ref 46–106)
LV sys vol: 22 mL
MPHR: 142 {beats}/min
Peak HR: 99 {beats}/min
Percent HR: 69 %
Rest HR: 75 {beats}/min
SDS: 0
SRS: 0
SSS: 2
TID: 1.09

## 2018-10-24 MED ORDER — TECHNETIUM TC 99M TETROFOSMIN IV KIT
33.2400 | PACK | Freq: Once | INTRAVENOUS | Status: AC | PRN
  Administered 2018-10-24: 33.24 via INTRAVENOUS

## 2018-10-24 MED ORDER — TECHNETIUM TC 99M TETROFOSMIN IV KIT
10.0000 | PACK | Freq: Once | INTRAVENOUS | Status: AC | PRN
  Administered 2018-10-24: 10.52 via INTRAVENOUS

## 2018-10-24 MED ORDER — REGADENOSON 0.4 MG/5ML IV SOLN
0.4000 mg | Freq: Once | INTRAVENOUS | Status: AC
  Administered 2018-10-24: 0.4 mg via INTRAVENOUS

## 2018-10-25 ENCOUNTER — Other Ambulatory Visit: Payer: Medicare Other

## 2018-10-25 DIAGNOSIS — M25512 Pain in left shoulder: Secondary | ICD-10-CM | POA: Diagnosis not present

## 2018-10-25 DIAGNOSIS — M6281 Muscle weakness (generalized): Secondary | ICD-10-CM | POA: Diagnosis not present

## 2018-10-25 DIAGNOSIS — M25511 Pain in right shoulder: Secondary | ICD-10-CM | POA: Diagnosis not present

## 2018-10-28 ENCOUNTER — Telehealth: Payer: Self-pay | Admitting: Gastroenterology

## 2018-10-28 NOTE — Telephone Encounter (Signed)
Patients colonoscopy has been canceled due to cardiac evaluation.  Endoscopy has been informed of cancellation.  No Fee Applies.  Thanks [employer]

## 2018-10-28 NOTE — Telephone Encounter (Signed)
Patient called to cancel her colonoscopy for 10-31-2018 with Dr Bonna Gains because she is going to have a cardiac evaluation and will call to r/s

## 2018-10-29 DIAGNOSIS — M25511 Pain in right shoulder: Secondary | ICD-10-CM | POA: Diagnosis not present

## 2018-10-29 DIAGNOSIS — M25512 Pain in left shoulder: Secondary | ICD-10-CM | POA: Diagnosis not present

## 2018-10-29 DIAGNOSIS — M6281 Muscle weakness (generalized): Secondary | ICD-10-CM | POA: Diagnosis not present

## 2018-10-30 ENCOUNTER — Ambulatory Visit (INDEPENDENT_AMBULATORY_CARE_PROVIDER_SITE_OTHER): Payer: Medicare Other | Admitting: Nurse Practitioner

## 2018-10-30 ENCOUNTER — Encounter: Payer: Medicare Other | Admitting: Internal Medicine

## 2018-10-30 ENCOUNTER — Encounter: Payer: Self-pay | Admitting: Nurse Practitioner

## 2018-10-30 VITALS — BP 150/64 | HR 72 | Ht 65.5 in | Wt 177.8 lb

## 2018-10-30 DIAGNOSIS — I10 Essential (primary) hypertension: Secondary | ICD-10-CM | POA: Diagnosis not present

## 2018-10-30 DIAGNOSIS — Z01810 Encounter for preprocedural cardiovascular examination: Secondary | ICD-10-CM | POA: Diagnosis not present

## 2018-10-30 DIAGNOSIS — I441 Atrioventricular block, second degree: Secondary | ICD-10-CM | POA: Diagnosis not present

## 2018-10-30 DIAGNOSIS — R0789 Other chest pain: Secondary | ICD-10-CM | POA: Diagnosis not present

## 2018-10-30 NOTE — Patient Instructions (Signed)
Medication Instructions:  Your physician recommends that you continue on your current medications as directed. Please refer to the Current Medication list given to you today.  If you need a refill on your cardiac medications before your next appointment, please call your pharmacy.   Lab work: None ordered  If you have labs (blood work) drawn today and your tests are completely normal, you will receive your results only by: [name] MyChart Message (if you have MyChart) OR . A paper copy in the mail If you have any lab test that is abnormal or we need to change your treatment, we will call you to review the results.  Testing/Procedures: None ordered   Follow-Up: At [facility], you and your health needs are our priority.  As part of our continuing mission to provide you with exceptional heart care, we have created designated Provider Care Teams.  These Care Teams include your primary Cardiologist (physician) and Advanced Practice Providers (APPs -  Physician Assistants and Nurse Practitioners) who all work together to provide you with the care you need, when you need it. You will need a follow up appointment in 6 months with Dr. Rockey Situ.

## 2018-10-30 NOTE — Progress Notes (Signed)
Office Visit    Patient Name: Alison Huffman Date of Encounter: 10/30/2018  Primary Care Provider:  Leone Haven, MD Primary Cardiologist:  Ida Rogue, MD  EP:  Clearnce Hasten, MD   Chief Complaint    78 year old female with a history of hypertension, hyperlipidemia, psoriatic arthritis, sleep apnea on CPAP, diastolic dysfunction, and atypical chest pain, who presents for follow-up and preoperative evaluation.  Past Medical History    Past Medical History:  Diagnosis Date  . Atypical chest pain    a. 10/2018 MV: EF 57%. No ischemia/infarct.  . Diastolic dysfunction    a. 04/2018 Echo: EF 55-60%, no rwma, Gr1 DD. Triv AI, mild MR. Mod dil LA. Nl RV fxn. PASP nl.  . GERD (gastroesophageal reflux disease)   . Headache    "dull one sometimes daily, at least weekly in last couple months" (07/22/2018)  . Hyperlipidemia   . Hypertension   . 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 Lichen sclerosus   . Osteopenia   . Pneumonia 2012? X 1  . 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 Psoriasis   . Psoriatic arthritis ([place])    " dx'd the 1st of this year, 2019" (07/22/2018)  . Rosacea   . Seborrheic keratosis   . Second degree heart block    a. 07/2018 s/p SJM Assurity MRI model PM2271 (Ser# [id number]).  . Sleep apnea    "using nasal pilllows since ~ 12/2017" (07/22/2018)   Past Surgical History:  Procedure Laterality Date  . APPENDECTOMY    . BREAST BIOPSY Left 1978   "benign"  . BREAST CYST ASPIRATION Bilateral   . COLONOSCOPY    . PACEMAKER IMPLANT N/A 07/23/2018   SJM Assurity [id number] implanted by Dr Rayann Heman for mobitz II second degree AV block    Allergies  No Known Allergies  History of Present Illness    78 year old female with the above past medical history including hypertension, hyperlipidemia, psoriatic arthritis, sleep apnea, diastolic dysfunction, and atypical chest pain.  She was previously evaluated with stress testing in 2013 which was reportedly  normal.  Seen by Dr. Rockey Situ in December 2018 in the setting of dyspnea and lower extremity swelling and underwent echocardiography which showed normal LV function grade 1 diastolic dysfunction.  No significant valvular abnormality.  In October 2019, she notes an increase to mobility and reduction x-rays tolerance and was seen by primary care and found to be hypertensive and bradycardic.  EKG shows second-degree AV block.  Referred to: Which she was subsequently admitted and underwent dual-chamber permanent pacemaker placement by our electrophysiologist.  Has since been followed by Dr. Rayann Heman in device clinic.    Earlier this winter, she noted some vaginal bleeding and was referred to gynecology.  She is pending hysteroscopy and D&C.  She was seen by Dr. Rayann Heman 2 weeks ago and reported some atypical chest discomfort that was present when she would lean forward.  She was referred for stress testing, which was nonischemic.  She does have a plan for follow-up with GI and EGD with regards to chest pain symptoms.  She is otherwise active and walks regularly without any significant dyspnea or chest pain.  She denies PND, palpitations, orthopnea, dizziness, syncope, edema, or early.  Home Medications    Prior to Admission medications   Medication Sig Start Date End Date Taking? Authorizing Provider  acetaminophen (TYLENOL) 325 MG tablet Take 325-650 mg by mouth every 6 (six) hours as needed for mild pain or headache.  Yes [provider]  calcium carbonate (TUMS EX) 750 MG chewable tablet Chew 2 tablets by mouth as needed for heartburn.   Yes [provider]  Cholecalciferol (VITAMIN D3) 50 MCG (2000 UT) TABS Take 2,000 Units by mouth daily.   Yes [provider]  Clobetasol Prop Emollient Base (CLOBETASOL PROPIONATE E) 0.05 % emollient cream Apply 1 application topically every Sunday.    Yes [provider]  desonide (DESOWEN) 0.05 % ointment Apply 1 application topically 2  (two) times daily as needed (for psoriasis).    Yes [provider]  diclofenac sodium (VOLTAREN) 1 % GEL Apply 4 g topically 4 (four) times daily. Patient taking differently: Apply 4 g topically 4 (four) times daily as needed.  09/06/18  Yes Arnett, Yvetta Coder, FNP  HUMIRA PEN 40 MG/0.4ML PNKT Inject 0.4 mLs into the skin every 14 (fourteen) days.   Yes [provider]  lidocaine (LIDODERM) 5 % Place 1 patch onto the skin daily. Remove & Discard patch within 12 hours. 09/06/18  Yes Arnett, Yvetta Coder, FNP  metroNIDAZOLE (METROCREAM) 0.75 % cream Apply topically 2 (two) times daily as needed (facial redness/irritation.).    Yes [provider]  nystatin cream (MYCOSTATIN) Apply 1 application topically 2 (two) times daily as needed for dry skin.    Yes [provider]  pantoprazole (PROTONIX) 40 MG tablet Take 1 tablet (40 mg total) by mouth daily. Patient taking differently: Take 40 mg by mouth daily before breakfast. 30 minutes prior to breakfast 09/20/18  Yes Leone Haven, MD  rosuvastatin (CRESTOR) 20 MG tablet Take 10 mg by mouth every Monday, Wednesday, and Friday.    Yes [provider]  tacrolimus (PROTOPIC) 0.1 % ointment Apply topically 2 (two) times daily as needed (for psoriasis).    Yes [provider]  valsartan (DIOVAN) 80 MG tablet Take 1 tablet (80 mg total) by mouth daily. 09/20/18  Yes Leone Haven, MD    Review of Systems    She has had mild upper chest discomfort with belching forward and is pending GI evaluation.  She denies dyspnea, palpitations, PND, apnea, dizziness, syncope, edema, or early satiety.  All other systems reviewed and are otherwise negative except as noted above.  Physical Exam    VS:  BP (!) 150/64 (BP Location: Left Arm, Patient Position: Sitting, Cuff Size: Normal)   Pulse 72   Ht 5' 5.5" (1.664 m)   Wt 177 lb 12 oz (80.6 kg)   BMI 29.13 kg/m  , BMI Body mass index is 29.13 kg/m. GEN:  Well nourished, well developed, in no acute distress. HEENT: normal. Neck: Supple, no JVD, carotid bruits, or masses. Cardiac: RRR, no murmurs, rubs, or gallops. No clubbing, cyanosis, edema.  Radials/DP/PT 2+ and equal bilaterally.  Respiratory:  Respirations regular and unlabored, clear to auscultation bilaterally. GI: Soft, nontender, nondistended, BS + x 4. MS: no deformity or atrophy. Skin: warm and dry, no rash. Neuro:  Strength and sensation are intact. Psych: Normal affect.  Accessory Clinical Findings    ECG personally reviewed by me today -regular sinus rhythm, 70, left bundle branch block- no acute changes.  Assessment & Plan    1.  Atypical chest pain: Patient has been having upper chest/sternal discomfort with associated belching.  She underwent stress testing which was nonischemic.  She has good exercise tolerance without symptoms or limitations.  She is pending hysteroscopy in the setting of vaginal bleeding and she may proceed with this  procedure without any further cardiac evaluation.  We would recommend continuation of statin therapy throughout the perioperative period.  2.  Essential hypertension: Blood pressure elevated today.  She says it typically runs in the 130s.  I have recommended that she continue to follow blood pressure at home and if she notes pressures consistently greater than 140, that she should either contact us or come here as we would like to adjust her regimen-likely increase valsartan to 160 mg daily.  3.  Diastolic dysfunction: Asymptomatic and euvolemic.  She will follow blood pressure as above.  4.  Hyperlipidemia: LDL was 84 in July.  Continue statin therapy.  5.  Preoperative cardiovascular evaluation/vaginal bleeding: Pending hysteroscopy and D&C.  See 1.  Okay to proceed without further ischemic evaluation.  6.  Mobitz 2 heart block: Status post dual-chamber permanent pacemaker in October 2019.  She will have remote device checks and otherwise  clinic follow-up with Dr. Rayann Heman as scheduled next year.  7.  Disposition: Follow-up with Dr. Rockey Situ in 6 months or sooner if necessary.   Murray Hodgkins, NP 10/30/2018, 11:07 AM

## 2018-10-31 ENCOUNTER — Ambulatory Visit: Admit: 2018-10-31 | Payer: Medicare Other | Admitting: Gastroenterology

## 2018-10-31 DIAGNOSIS — M25511 Pain in right shoulder: Secondary | ICD-10-CM | POA: Diagnosis not present

## 2018-10-31 DIAGNOSIS — M6281 Muscle weakness (generalized): Secondary | ICD-10-CM | POA: Diagnosis not present

## 2018-10-31 DIAGNOSIS — M25512 Pain in left shoulder: Secondary | ICD-10-CM | POA: Diagnosis not present

## 2018-10-31 SURGERY — ESOPHAGOGASTRODUODENOSCOPY (EGD) WITH PROPOFOL
Anesthesia: General

## 2018-11-01 ENCOUNTER — Telehealth: Payer: Self-pay

## 2018-11-01 NOTE — Telephone Encounter (Signed)
Patient is aware of OR on 11/05/17, and that I spoke to [facility] @ Pre-admit Testing, and was told she doesn't need another Pre-admit appt. Patient was given the phone# to Same Day Surgery and she will call on Monday between 1-3pm for an arrival time. Patient said she still has her surgery instructions.

## 2018-11-01 NOTE — Telephone Encounter (Signed)
Pt called after hour nurse this am stating she wants to leave a msg for [place].  She was approved for the surgery and is ready to get it scheduled.  Please call.  [phone]

## 2018-11-05 ENCOUNTER — Ambulatory Visit: Payer: Medicare Other | Admitting: Certified Registered Nurse Anesthetist

## 2018-11-05 ENCOUNTER — Encounter
Admission: RE | Disposition: A | Discharge status: Home / Self Care | Payer: Self-pay | Source: Home / Self Care | Attending: Obstetrics and Gynecology

## 2018-11-05 ENCOUNTER — Ambulatory Visit
Admission: RE | Admit: 2018-11-05 | Discharge: 2018-11-05 | Disposition: A | Discharge status: Home / Self Care | Payer: Medicare Other | Attending: Obstetrics and Gynecology | Admitting: Obstetrics and Gynecology

## 2018-11-05 ENCOUNTER — Other Ambulatory Visit: Payer: Self-pay

## 2018-11-05 DIAGNOSIS — Z87891 Personal history of nicotine dependence: Secondary | ICD-10-CM | POA: Insufficient documentation

## 2018-11-05 DIAGNOSIS — K219 Gastro-esophageal reflux disease without esophagitis: Secondary | ICD-10-CM | POA: Insufficient documentation

## 2018-11-05 DIAGNOSIS — G629 Polyneuropathy, unspecified: Secondary | ICD-10-CM | POA: Diagnosis not present

## 2018-11-05 DIAGNOSIS — G4733 Obstructive sleep apnea (adult) (pediatric): Secondary | ICD-10-CM | POA: Diagnosis not present

## 2018-11-05 DIAGNOSIS — L821 Other seborrheic keratosis: Secondary | ICD-10-CM | POA: Insufficient documentation

## 2018-11-05 DIAGNOSIS — R0789 Other chest pain: Secondary | ICD-10-CM | POA: Diagnosis not present

## 2018-11-05 DIAGNOSIS — N950 Postmenopausal bleeding: Secondary | ICD-10-CM | POA: Diagnosis not present

## 2018-11-05 DIAGNOSIS — L4050 Arthropathic psoriasis, unspecified: Secondary | ICD-10-CM | POA: Insufficient documentation

## 2018-11-05 DIAGNOSIS — N841 Polyp of cervix uteri: Secondary | ICD-10-CM | POA: Diagnosis not present

## 2018-11-05 DIAGNOSIS — N650 Deformity of reconstructed breast: Secondary | ICD-10-CM | POA: Diagnosis not present

## 2018-11-05 DIAGNOSIS — E785 Hyperlipidemia, unspecified: Secondary | ICD-10-CM | POA: Diagnosis not present

## 2018-11-05 DIAGNOSIS — M1990 Unspecified osteoarthritis, unspecified site: Secondary | ICD-10-CM | POA: Diagnosis not present

## 2018-11-05 DIAGNOSIS — I441 Atrioventricular block, second degree: Secondary | ICD-10-CM | POA: Insufficient documentation

## 2018-11-05 DIAGNOSIS — N840 Polyp of corpus uteri: Secondary | ICD-10-CM | POA: Diagnosis not present

## 2018-11-05 DIAGNOSIS — I10 Essential (primary) hypertension: Secondary | ICD-10-CM | POA: Insufficient documentation

## 2018-11-05 DIAGNOSIS — L719 Rosacea, unspecified: Secondary | ICD-10-CM | POA: Insufficient documentation

## 2018-11-05 DIAGNOSIS — Z950 Presence of cardiac pacemaker: Secondary | ICD-10-CM | POA: Insufficient documentation

## 2018-11-05 DIAGNOSIS — R9389 Abnormal findings on diagnostic imaging of other specified body structures: Secondary | ICD-10-CM | POA: Insufficient documentation

## 2018-11-05 DIAGNOSIS — M8580 Other specified disorders of bone density and structure, unspecified site: Secondary | ICD-10-CM | POA: Insufficient documentation

## 2018-11-05 DIAGNOSIS — Z79899 Other long term (current) drug therapy: Secondary | ICD-10-CM | POA: Diagnosis not present

## 2018-11-05 DIAGNOSIS — G4730 Sleep apnea, unspecified: Secondary | ICD-10-CM | POA: Insufficient documentation

## 2018-11-05 HISTORY — PX: HYSTEROSCOPY WITH D & C: SHX1775

## 2018-11-05 LAB — CBC
HCT: 41.5 % (ref 36.0–46.0)
Hemoglobin: 13.4 g/dL (ref 12.0–15.0)
MCH: 30.5 pg (ref 26.0–34.0)
MCHC: 32.3 g/dL (ref 30.0–36.0)
MCV: 94.3 fL (ref 80.0–100.0)
Platelets: 224 10*3/uL (ref 150–400)
RBC: 4.4 MIL/uL (ref 3.87–5.11)
RDW: 12.7 % (ref 11.5–15.5)
WBC: 5.9 10*3/uL (ref 4.0–10.5)
nRBC: 0 % (ref 0.0–0.2)

## 2018-11-05 LAB — TYPE AND SCREEN
ABO/RH(D): O POS
Antibody Screen: NEGATIVE

## 2018-11-05 SURGERY — DILATATION AND CURETTAGE /HYSTEROSCOPY
Anesthesia: General

## 2018-11-05 MED ORDER — OXYCODONE HCL 5 MG PO TABS: 5.0000 mg | ORAL_TABLET | Freq: Once | ORAL | Status: DC | PRN

## 2018-11-05 MED ORDER — DEXAMETHASONE SODIUM PHOSPHATE 10 MG/ML IJ SOLN
INTRAMUSCULAR | Status: DC | PRN
  Administered 2018-11-05: 10 mg via INTRAVENOUS

## 2018-11-05 MED ORDER — OXYCODONE HCL 5 MG/5ML PO SOLN: 5.0000 mg | Freq: Once | ORAL | Status: DC | PRN

## 2018-11-05 MED ORDER — LACTATED RINGERS IV SOLN
INTRAVENOUS | Status: DC
  Administered 2018-11-05: 10:00:00 via INTRAVENOUS

## 2018-11-05 MED ORDER — FENTANYL CITRATE (PF) 100 MCG/2ML IJ SOLN
INTRAMUSCULAR | Status: AC
  Filled 2018-11-05: qty 2

## 2018-11-05 MED ORDER — PROPOFOL 10 MG/ML IV BOLUS
INTRAVENOUS | Status: AC
  Filled 2018-11-05: qty 20

## 2018-11-05 MED ORDER — PROMETHAZINE HCL 25 MG/ML IJ SOLN: 6.2500 mg | INTRAMUSCULAR | Status: DC | PRN

## 2018-11-05 MED ORDER — ONDANSETRON HCL 4 MG/2ML IJ SOLN
INTRAMUSCULAR | Status: DC | PRN
  Administered 2018-11-05: 4 mg via INTRAVENOUS

## 2018-11-05 MED ORDER — FENTANYL CITRATE (PF) 100 MCG/2ML IJ SOLN: 25.0000 ug | INTRAMUSCULAR | Status: DC | PRN

## 2018-11-05 MED ORDER — FENTANYL CITRATE (PF) 100 MCG/2ML IJ SOLN
INTRAMUSCULAR | Status: DC | PRN
  Administered 2018-11-05 (×2): 25 ug via INTRAVENOUS
  Administered 2018-11-05: 50 ug via INTRAVENOUS

## 2018-11-05 MED ORDER — PHENYLEPHRINE HCL 10 MG/ML IJ SOLN
INTRAMUSCULAR | Status: DC | PRN
  Administered 2018-11-05: 100 ug via INTRAVENOUS

## 2018-11-05 MED ORDER — MEPERIDINE HCL 50 MG/ML IJ SOLN: 6.2500 mg | INTRAMUSCULAR | Status: DC | PRN

## 2018-11-05 MED ORDER — LIDOCAINE HCL (CARDIAC) PF 100 MG/5ML IV SOSY
PREFILLED_SYRINGE | INTRAVENOUS | Status: DC | PRN
  Administered 2018-11-05: 100 mg via INTRAVENOUS

## 2018-11-05 MED ORDER — PROPOFOL 10 MG/ML IV BOLUS
INTRAVENOUS | Status: DC | PRN
  Administered 2018-11-05: 140 mg via INTRAVENOUS

## 2018-11-05 MED ORDER — DEXAMETHASONE SODIUM PHOSPHATE 10 MG/ML IJ SOLN
INTRAMUSCULAR | Status: AC
  Filled 2018-11-05: qty 1

## 2018-11-05 MED ORDER — LIDOCAINE HCL (PF) 2 % IJ SOLN
INTRAMUSCULAR | Status: AC
  Filled 2018-11-05: qty 10

## 2018-11-05 MED ORDER — ONDANSETRON HCL 4 MG/2ML IJ SOLN
INTRAMUSCULAR | Status: AC
  Filled 2018-11-05: qty 2

## 2018-11-05 SURGICAL SUPPLY — 20 items
BAG COUNTER SPONGE EZ (MISCELLANEOUS) ×2 IMPLANT
CATH ROBINSON RED A/P 16FR (CATHETERS) ×2 IMPLANT
DEVICE MYOSURE LITE (MISCELLANEOUS) ×2 IMPLANT
DEVICE MYOSURE REACH (MISCELLANEOUS) IMPLANT
ELECT REM PT RETURN 9FT ADLT (ELECTROSURGICAL) ×2
ELECTRODE REM PT RTRN 9FT ADLT (ELECTROSURGICAL) ×1 IMPLANT
GLOVE BIO SURGEON STRL SZ 6.5 (GLOVE) ×2 IMPLANT
GLOVE BIOGEL PI IND STRL 6.5 (GLOVE) ×1 IMPLANT
GLOVE BIOGEL PI INDICATOR 6.5 (GLOVE) ×1
GOWN STRL REUS W/ TWL LRG LVL3 (GOWN DISPOSABLE) ×2 IMPLANT
GOWN STRL REUS W/TWL LRG LVL3 (GOWN DISPOSABLE) ×2
KIT PROCEDURE FLUENT (KITS) ×2 IMPLANT
PACK DNC HYST (MISCELLANEOUS) ×2 IMPLANT
PAD OB MATERNITY 4.3X12.25 (PERSONAL CARE ITEMS) ×2 IMPLANT
PAD PREP 24X41 OB/GYN DISP (PERSONAL CARE ITEMS) ×2 IMPLANT
SOL .9 NS 3000ML IRR  AL (IV SOLUTION) ×1
SOL .9 NS 3000ML IRR UROMATIC (IV SOLUTION) ×1 IMPLANT
STRAP SAFETY 5IN WIDE (MISCELLANEOUS) ×2 IMPLANT
TOWEL OR 17X26 4PK STRL BLUE (TOWEL DISPOSABLE) ×2 IMPLANT
TUBING CONNECTING 10 (TUBING) ×2 IMPLANT

## 2018-11-05 NOTE — Anesthesia Post-op Follow-up Note (Signed)
Anesthesia QCDR form completed.        

## 2018-11-05 NOTE — Op Note (Signed)
Operative Note  11/05/2018  PRE-OP DIAGNOSIS: thickened endometrium, postmenopausal bleeding  POST-OP DIAGNOSIS: thickened endometrium, postmenopausal bleeding   SURGEON: Adrian Prows MD  PROCEDURE: Procedure(s): DILATATION AND CURETTAGE /HYSTEROSCOPY , MYOSURE  ANESTHESIA: Choice   ESTIMATED BLOOD LOSS: 50cc  SPECIMENS: ECC and EMC  COMPLICATIONS: None  DISPOSITION: PACU - hemodynamically stable.  CONDITION: stable  FINDINGS: vaginal narrowing, cervical stenosis, Small, mobile 9 uterus with no masses and bilateral adnexa without masses or fullness.  Uterine polyps seen on hysteroscopy.   PROCEDURE IN DETAIL: After informed consent was obtained, the patient was taken to the operating room where anesthesia was obtained without difficulty. The patient was positioned in the dorsal lithotomy position in [place]. The patient's bladder was catheterized with an in and out foley catheter. The patient was examined under anesthesia, with the above noted findings. The right angle retractor was placed inside the patient's vagina, and the the anterior lip of the cervix was seen and grasped with the tenaculum.  An endocervical curettage was performed using a kevorkian curette.  The lacrimal dilators were used to progressively dilate the cervix. Once the sound could be introduced the uterine cavity was sounded to 9 cm. Progressive dilation of the cervix continued to a 20 French-Pratt dilator. The 0-degree hysteroscope was used to examine the uterine cavity. Polyps were seen in the left cornua and in the endocervical canal. The uterine cavity was curetted, but scant tissue was returned. The hysteroscopy was repeated. The polyps were still present. The Myosure device was used to then remove the polyps and sample the endometrium under direct visualization. This yield specimen of polyps and  endometrial curettings. Excellent hemostasis was noted, and all instruments were removed, with excellent  hemostasis noted throughout. She was then taken out of dorsal lithotomy. The patient tolerated the procedure well. Sponge, lap and needle counts were correct x2. The patient was taken to recovery room in excellent condition.  Adrian Prows MD [facility], [facility] 11/05/2018  1:10 PM

## 2018-11-05 NOTE — Transfer of Care (Signed)
Immediate Anesthesia Transfer of Care Note  Patient: Alison Huffman  Procedure(s) Performed: DILATATION AND CURETTAGE /HYSTEROSCOPY (N/A )  Patient Location: PACU  Anesthesia Type:General  Level of Consciousness: drowsy  Airway & Oxygen Therapy: Patient Spontanous Breathing and Patient connected to face mask oxygen  Post-op Assessment: Report given to RN and Post -op Vital signs reviewed and stable  Post vital signs: Reviewed and stable  Last Vitals:  Vitals Value Taken Time  BP 145/79 11/05/2018 12:45 PM  Temp    Pulse    Resp 12 11/05/2018 12:47 PM  SpO2    Vitals shown include unvalidated device data.  Last Pain:  Vitals:   11/05/18 0959  TempSrc: Oral  PainSc: 0-No pain         Complications: No apparent anesthesia complications

## 2018-11-05 NOTE — Anesthesia Procedure Notes (Addendum)
Procedure Name: LMA Insertion Date/Time: 11/05/2018 11:46 AM Performed by: Nelda Marseille, CRNA Pre-anesthesia Checklist: Patient identified, Emergency Drugs available, Suction available, Patient being monitored and Timeout performed Oxygen Delivery Method: Circle system utilized Preoxygenation: Pre-oxygenation with 100% oxygen Induction Type: IV induction LMA: LMA inserted LMA Size: 4.0 Grade View: Grade II Tube size: 4.0 mm Number of attempts: 1 Placement Confirmation: positive ETCO2 and breath sounds checked- equal and bilateral Tube secured with: Tape Dental Injury: Teeth and Oropharynx as per pre-operative assessment

## 2018-11-05 NOTE — Anesthesia Preprocedure Evaluation (Signed)
Anesthesia Evaluation  Patient identified by MRN, date of birth, ID band Patient awake    Reviewed: Allergy & Precautions, NPO status , Patient's Chart, lab work & pertinent test results  History of Anesthesia Complications Negative for: history of anesthetic complications  Airway Mallampati: III  TM Distance: >3 FB Neck ROM: Full    Dental no notable dental hx.    Pulmonary sleep apnea and Continuous Positive Airway Pressure Ventilation , neg COPD, former smoker,    breath sounds clear to auscultation- rhonchi (-) wheezing      Cardiovascular hypertension, Pt. on medications (-) CAD, (-) Past MI, (-) Cardiac Stents and (-) CABG + pacemaker  Rhythm:Regular Rate:Normal - Systolic murmurs and - Diastolic murmurs    Neuro/Psych  Headaches, neg Seizures negative psych ROS   GI/Hepatic Neg liver ROS, GERD  ,  Endo/Other  negative endocrine ROSneg diabetes  Renal/GU negative Renal ROS     Musculoskeletal  (+) Arthritis ,   Abdominal (+) - obese,   Peds  Hematology negative hematology ROS (+)   Anesthesia Other Findings Past Medical History: No date: Atypical chest pain     Comment:  a. 10/2018 MV: EF 57%. No ischemia/infarct. No date: Diastolic dysfunction     Comment:  a. 04/2018 Echo: EF 55-60%, no rwma, Gr1 DD. Triv AI,               mild MR. Mod dil LA. Nl RV fxn. PASP nl. 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 X 1: Pneumonia 07/23/2018: Presence of permanent cardiac pacemaker No date: Psoriasis No date: Psoriatic arthritis ([place])     Comment:  " dx'd the 1st of this year, 2019" (07/22/2018) No date: Rosacea No date: Seborrheic keratosis No  date: Second degree heart block     Comment:  a. 07/2018 s/p SJM Assurity MRI model PM2271 (Ser#               [id number]). No date: Sleep apnea     Comment:  "using nasal pilllows since ~ 12/2017" (07/22/2018)   Reproductive/Obstetrics                             Anesthesia Physical Anesthesia Plan  ASA: III  Anesthesia Plan: General   Post-op Pain Management:    Induction: Intravenous  PONV Risk Score and Plan: 2 and Ondansetron, Dexamethasone and Midazolam  Airway Management Planned: LMA  Additional Equipment:   Intra-op Plan:   Post-operative Plan:   Informed Consent: I have reviewed the patients History and Physical, chart, labs and discussed the procedure including the risks, benefits and alternatives for the proposed anesthesia with the patient or authorized representative who has indicated his/her understanding and acceptance.     Dental advisory given  Plan Discussed with: CRNA and Anesthesiologist  Anesthesia Plan Comments:         Anesthesia Quick Evaluation

## 2018-11-05 NOTE — Anesthesia Postprocedure Evaluation (Signed)
Anesthesia Post Note  Patient: Alison Huffman  Procedure(s) Performed: DILATATION AND CURETTAGE /HYSTEROSCOPY (N/A )  Patient location during evaluation: PACU Anesthesia Type: General Level of consciousness: awake and alert and oriented Pain management: pain level controlled Vital Signs Assessment: post-procedure vital signs reviewed and stable Respiratory status: spontaneous breathing, nonlabored ventilation and respiratory function stable Cardiovascular status: blood pressure returned to baseline and stable Postop Assessment: no signs of nausea or vomiting Anesthetic complications: no     Last Vitals:  Vitals:   11/05/18 1346 11/05/18 1401  BP: (!) 137/58 (!) 165/83  Pulse: 78 71  Resp: 13 14  Temp:  (!) 36.2 C  SpO2: 98% 97%    Last Pain:  Vitals:   11/05/18 1401  TempSrc: Temporal  PainSc: 1                  Evolett Somarriba

## 2018-11-05 NOTE — Discharge Instructions (Signed)
  AMBULATORY SURGERY  DISCHARGE INSTRUCTIONS   1) The drugs that you were given will stay in your system until tomorrow so for the next 24 hours you should not:  A) Drive an automobile B) Make any legal decisions C) Drink any alcoholic beverage   2) You may resume regular meals tomorrow.  Today it is better to start with liquids and gradually work up to solid foods.  You may eat anything you prefer, but it is better to start with liquids, then soup and crackers, and gradually work up to solid foods.   3) Please notify your doctor immediately if you have any unusual bleeding, trouble breathing, redness and pain at the surgery site, drainage, fever, or pain not relieved by medication.    4) Additional Instructions: TAKE A STOOL SOFTENER TWICE A DAY WHILE TAKING NARCOTIC PAIN MEDICINE TO PREVENT CONSTIPATION   Please contact your physician with any problems or Same Day Surgery at [phone], Monday through Friday 6 am to 4 pm, or [place] [facility] [place] Main number at [phone].   

## 2018-11-06 LAB — SURGICAL PATHOLOGY

## 2018-11-07 ENCOUNTER — Telehealth: Payer: Self-pay | Admitting: Obstetrics and Gynecology

## 2018-11-07 NOTE — Telephone Encounter (Signed)
Discussed pathology result from hysteroscopy D&C with patient. - No evidence of hyperplasia or malignancy.  Discussed follow up for ovarian cyst. Discussed that ovarian cancer can not be excluded without tissue sampling, but that it appears to be a complex cyst with no increased blood flow and she had normal ROMA testing. IOTA Adnex model results below support this.  To avoid a surgical procedure, at this time, will plan on surveillance of the ovarian cyst. Will schedule patient for a follow up Transvaginal US in March. Will refer to gynecological oncology for their opinion on the ovarian cyst as well.  Adrian Prows MD [facility] 11/07/2018 1:27 PM    IOTA Calculation:   1. Age of the patient at examination (years)  2. Oncology center (referral center for gyn-oncol)?   3. Maximal diameter of the lesion (mm)  4. <3 mm this is 0, and cannot be larger than the maximal diameter of the lesion." for="d"Maximal diameter of the largest solid part (mm)  5. More than 10 locules?   6. Number of papillations (papillary projections)      7. Acoustic shadows present?   8. Ascites (fluid outside pelvis) present?   9. Serum CA-125 (U/ml)   Additional information is given when moving the mouse pointer over the variable names.  %[id number] of benign tumorrisk metastatic cancer to the adnexarisk stage II-IV ovarian cancerrisk stage I ovarian cancerrisk borderlineCHANCE OF BENIGN TUMORRISK OF MALIGNANCYborderlinestage Istage II - [id number].[email or web address]   ???Patient Specific Risk??? ???Relative Risk??? ???Baseline Risk???  CHANCE OF BENIGN TUMOR ???99.0 % ???1.5  ???68.2%  RISK OF MALIGNANCY ???1.0 % ???0  ???31.8 %  ->Risk borderline ???0.5 % ???0.1  ???6.3 %  ->Risk stage I ovarian cancer ???0.2 % ???0  ???7.5 %  ->Risk stage II-IV ovarian cancer ???0.1 % ???0  ???14.1 %  ->Risk metastatic cancer to the adnexa? ???0.2 % ???0   ???4.0 %

## 2018-11-07 NOTE — Progress Notes (Signed)
Discussed on phone with patient, released to [facility]

## 2018-11-12 ENCOUNTER — Other Ambulatory Visit: Payer: Self-pay | Admitting: Obstetrics and Gynecology

## 2018-11-12 ENCOUNTER — Telehealth: Payer: Self-pay | Admitting: Family Medicine

## 2018-11-12 ENCOUNTER — Ambulatory Visit (INDEPENDENT_AMBULATORY_CARE_PROVIDER_SITE_OTHER): Payer: Medicare Other | Admitting: Obstetrics and Gynecology

## 2018-11-12 ENCOUNTER — Encounter: Payer: Self-pay | Admitting: Obstetrics and Gynecology

## 2018-11-12 VITALS — BP 114/70 | HR 68 | Ht 65.5 in | Wt 175.0 lb

## 2018-11-12 DIAGNOSIS — N83202 Unspecified ovarian cyst, left side: Secondary | ICD-10-CM

## 2018-11-12 DIAGNOSIS — R102 Pelvic and perineal pain: Secondary | ICD-10-CM

## 2018-11-12 DIAGNOSIS — N3001 Acute cystitis with hematuria: Secondary | ICD-10-CM

## 2018-11-12 LAB — POCT URINALYSIS DIPSTICK
Bilirubin, UA: NEGATIVE
Glucose, UA: NEGATIVE
Ketones, UA: NEGATIVE
Nitrite, UA: NEGATIVE
Protein, UA: NEGATIVE
Spec Grav, UA: 1.01 (ref 1.010–1.025)
Urobilinogen, UA: 0.2 E.U./dL
pH, UA: 6.5 (ref 5.0–8.0)

## 2018-11-12 MED ORDER — CIPROFLOXACIN HCL 500 MG PO TABS: 500.0000 mg | ORAL_TABLET | Freq: Every day | ORAL | 0 refills | Status: AC

## 2018-11-12 NOTE — Progress Notes (Signed)
  Postoperative Follow-up Patient presents post op from operative hysteroscopy for post menopausal bleeding, 1 week ago.  Subjective: Patient reports some improvement in her preop symptoms. Eating a regular diet without difficulty. Pain is controlled without any medications.  Activity: normal activities of daily living. Patient reports additional symptom's since surgery of Irregular bleeding.  Small vaginal spotting since surgery. Passage of small amount of tissue. Last 2 days has felt pelvic pressure. No pain with urination. No increased frequency.   Objective: BP 114/70   Pulse 68   Ht 5' 5.5" (1.664 m)   Wt 175 lb (79.4 kg)   BMI 28.68 kg/m  Physical Exam Constitutional:      Appearance: She is well-developed.  Genitourinary:     Vagina and uterus normal.     No lesions in the vagina.     No cervical motion tenderness.     No right or left adnexal mass present.  HENT:     Head: Normocephalic and atraumatic.  Neck:     Musculoskeletal: Neck supple.     Thyroid: No thyromegaly.  Cardiovascular:     Rate and Rhythm: Normal rate and regular rhythm.     Heart sounds: Normal heart sounds.  Pulmonary:     Effort: Pulmonary effort is normal.     Breath sounds: Normal breath sounds.  Chest:     Breasts:        Right: No inverted nipple, mass, nipple discharge or skin change.        Left: No inverted nipple, mass, nipple discharge or skin change.  Abdominal:     General: Bowel sounds are normal. There is no distension.     Palpations: Abdomen is soft. There is no mass.  Neurological:     Mental Status: She is alert and oriented to person, place, and time.  Skin:    General: Skin is warm and dry.  Psychiatric:        Behavior: Behavior normal.        Thought Content: Thought content normal.        Judgment: Judgment normal.  Vitals signs reviewed.     Assessment: s/p :  operative hysteroscopy stable  Plan: Patient has done well after surgery with no apparent  complications.  I have discussed the post-operative course to date, and the expected progress moving forward.  The patient understands what complications to be concerned about.  I will see the patient in routine follow up, or sooner if needed.    Urine culture sent Rx for ciproflxacin  IOTA score 99% benign. Patient is comfortable with monitoring of ovarian cyst. Will obtain repeat pelvic US in March. Follow up consultation with GYN ONC after ultrasound.   Activity plan: No restriction. Pelvic rest.  Tywaun Hiltner R Jenaveve Fenstermaker 11/12/2018, 9:43 AM

## 2018-11-12 NOTE — Telephone Encounter (Signed)
Copied from [place] [phone] [phone]. Topic: General - Inquiry >> Nov 12, 2018 11:13 AM Rayann Heman wrote: Reason for CRM: Linus Orn calling from [employer] called and stated that she sent over a fax for evaluation of therapy and would like to know if we have received. I advised her to resend just in case. She would like a call back if we have any concerns. Please advise

## 2018-11-13 ENCOUNTER — Ambulatory Visit
Admission: RE | Admit: 2018-11-13 | Discharge: 2018-11-13 | Disposition: A | Discharge status: Home / Self Care | Payer: Medicare Other | Source: Ambulatory Visit | Attending: Family Medicine | Admitting: Family Medicine

## 2018-11-13 DIAGNOSIS — M85851 Other specified disorders of bone density and structure, right thigh: Secondary | ICD-10-CM | POA: Diagnosis not present

## 2018-11-13 DIAGNOSIS — M810 Age-related osteoporosis without current pathological fracture: Secondary | ICD-10-CM | POA: Insufficient documentation

## 2018-11-13 NOTE — Telephone Encounter (Signed)
Called Alison Huffman and left a detailed VM that I have not received any paper work for [employer] left her our fax number to try and refax.

## 2018-11-14 ENCOUNTER — Other Ambulatory Visit (INDEPENDENT_AMBULATORY_CARE_PROVIDER_SITE_OTHER): Payer: Medicare Other

## 2018-11-14 ENCOUNTER — Telehealth: Payer: Self-pay | Admitting: Family Medicine

## 2018-11-14 DIAGNOSIS — E785 Hyperlipidemia, unspecified: Secondary | ICD-10-CM | POA: Diagnosis not present

## 2018-11-14 LAB — HEPATIC FUNCTION PANEL
ALT: 17 U/L (ref 0–35)
AST: 16 U/L (ref 0–37)
Albumin: 4 g/dL (ref 3.5–5.2)
Alkaline Phosphatase: 85 U/L (ref 39–117)
Bilirubin, Direct: 0.1 mg/dL (ref 0.0–0.3)
Total Bilirubin: 0.6 mg/dL (ref 0.2–1.2)
Total Protein: 6.9 g/dL (ref 6.0–8.3)

## 2018-11-14 LAB — URINE CULTURE

## 2018-11-14 LAB — LDL CHOLESTEROL, DIRECT: Direct LDL: 108 mg/dL

## 2018-11-14 NOTE — Telephone Encounter (Signed)
Copied from [place] [id number]. Topic: General - Inquiry >> Nov 14, 2018  2:47 PM Virl Axe D wrote: Reason for CRM: Pt called back for lab results. NT unavailable. Please advise.

## 2018-11-15 ENCOUNTER — Telehealth: Payer: Self-pay | Admitting: Family Medicine

## 2018-11-15 ENCOUNTER — Telehealth: Payer: Self-pay | Admitting: Gastroenterology

## 2018-11-15 DIAGNOSIS — E785 Hyperlipidemia, unspecified: Secondary | ICD-10-CM

## 2018-11-15 NOTE — Telephone Encounter (Signed)
PT is calling to schedule endoscopy with  Dr. Bonna Gains

## 2018-11-15 NOTE — Telephone Encounter (Signed)
Result note read to patient; verbalizes understanding. States tolerating Crestor and will increase to 5 days a week. F/U lab appt made for 12/13/2018. TE as results not routed to [facility].

## 2018-11-16 NOTE — Addendum Note (Signed)
Addended by: Caryl Bis, Carlen Fils G on: 11/16/2018 11:10 AM   Modules accepted: Orders

## 2018-11-19 NOTE — Telephone Encounter (Signed)
Pt is calling again to schedule endoscopy please return her call

## 2018-11-20 NOTE — Telephone Encounter (Signed)
Pt left vm for Debbie returning her call

## 2018-11-20 NOTE — Telephone Encounter (Signed)
LMTCO. Pt's EGD was canceled in January regarding a cardiac evaluation. Will need more information as to: has pt seen cardiologist (Dr. Rockey Situ), we will also need to confirm with Dr. Bonna Gains once I have spoken with pt.

## 2018-11-21 ENCOUNTER — Other Ambulatory Visit: Payer: Self-pay | Admitting: Family Medicine

## 2018-11-21 DIAGNOSIS — M810 Age-related osteoporosis without current pathological fracture: Secondary | ICD-10-CM

## 2018-11-25 NOTE — Telephone Encounter (Signed)
Patient called to r/s her upper endoscopy. She will be at the home # until 12:30 today.

## 2018-11-27 NOTE — Telephone Encounter (Signed)
Pt left vm she states she has been trying to get in touch with Debbie to r/s her procedure she states Debbie had left her 1 message and she has called 5 or 6 times please call pt to r/s

## 2018-12-11 ENCOUNTER — Ambulatory Visit
Admission: RE | Admit: 2018-12-11 | Discharge: 2018-12-11 | Disposition: A | Discharge status: Home / Self Care | Payer: Medicare Other | Source: Ambulatory Visit | Attending: Obstetrics and Gynecology | Admitting: Obstetrics and Gynecology

## 2018-12-11 ENCOUNTER — Other Ambulatory Visit: Payer: Self-pay | Admitting: Obstetrics and Gynecology

## 2018-12-11 ENCOUNTER — Other Ambulatory Visit: Payer: Self-pay

## 2018-12-11 DIAGNOSIS — N83202 Unspecified ovarian cyst, left side: Secondary | ICD-10-CM | POA: Diagnosis not present

## 2018-12-11 DIAGNOSIS — N859 Noninflammatory disorder of uterus, unspecified: Secondary | ICD-10-CM | POA: Diagnosis not present

## 2018-12-12 DIAGNOSIS — R142 Eructation: Secondary | ICD-10-CM | POA: Diagnosis not present

## 2018-12-12 DIAGNOSIS — K219 Gastro-esophageal reflux disease without esophagitis: Secondary | ICD-10-CM | POA: Diagnosis not present

## 2018-12-12 DIAGNOSIS — Z8719 Personal history of other diseases of the digestive system: Secondary | ICD-10-CM | POA: Diagnosis not present

## 2018-12-12 DIAGNOSIS — R079 Chest pain, unspecified: Secondary | ICD-10-CM | POA: Diagnosis not present

## 2018-12-12 NOTE — Telephone Encounter (Signed)
LMTCO. (Needs cardiac clearance, this is why EGD was canceled in January).

## 2018-12-13 ENCOUNTER — Other Ambulatory Visit (INDEPENDENT_AMBULATORY_CARE_PROVIDER_SITE_OTHER): Payer: Medicare Other

## 2018-12-13 ENCOUNTER — Other Ambulatory Visit: Payer: Self-pay

## 2018-12-13 DIAGNOSIS — E785 Hyperlipidemia, unspecified: Secondary | ICD-10-CM | POA: Diagnosis not present

## 2018-12-13 LAB — HEPATIC FUNCTION PANEL
ALT: 18 U/L (ref 0–35)
AST: 17 U/L (ref 0–37)
Albumin: 4.2 g/dL (ref 3.5–5.2)
Alkaline Phosphatase: 89 U/L (ref 39–117)
Bilirubin, Direct: 0.1 mg/dL (ref 0.0–0.3)
Total Bilirubin: 0.7 mg/dL (ref 0.2–1.2)
Total Protein: 6.7 g/dL (ref 6.0–8.3)

## 2018-12-13 LAB — LDL CHOLESTEROL, DIRECT: Direct LDL: 93 mg/dL

## 2018-12-16 ENCOUNTER — Telehealth: Payer: Self-pay | Admitting: Nurse Practitioner

## 2018-12-16 NOTE — Progress Notes (Deleted)
Gynecologic Oncology Consult Visit   Referring Provider: Dr. Gilman Schmidt  Chief Complaint: Left ovarian mass  Subjective:  Alison Huffman is a 78 y.o. female who is seen in consultation from Dr. Gilman Schmidt for left ovarian mass.  Patient initially presented to GYN/Dr. Gilman Schmidt, for a one-time episode of postmenopausal bleeding.  Ultrasound showed thickened endometrium at 8.6 mm and she was found to have left-sided ovarian cyst with hyperechoic appearance.  CA 125 09/30/2018 8.5  HE4 09/30/2018 70.9  Postmenopausal ROMA- 1.10  US-09/30/2018 Uterus anteverted and measuring 6.2 x 3.8 x 2.6 cm. Endometrium/endocervix is thickened and heterogeneous measuring 8.6 mm and endocervical measuring 13.9 mm.  Left ovary measuring 2.3 x 2.2 x 1.8 cm.  Tissue appeared abnormal.  Questionable hypoechoic lesion 1.9 cm.  Does not appear cystic.  She underwent D&C hysteroscopy with Dr. Gilman Schmidt on 11/05/2018.  Pathology DIAGNOSIS:  A. ENDOCERVIX; CURETTAGE:  - SCANT MINUTE FRAGMENTS OF BENIGN ENDOCERVICAL TISSUE AND SQUAMOUS  EPITHELIUM.   B. ENDOMETRIUM; POLYPECTOMY AND SAMPLING:  - ENDOMETRIAL POLYP FRAGMENTS.  - ENDOCERVICAL POLYP FRAGMENTS.  - SCANT MINUTE FRAGMENTS OF ATROPHIC ENDOMETRIUM, ENDOCERVICAL TISSUE,  AND SQUAMOUS EPITHELIUM.  - NEGATIVE FOR ATYPIA AND MALIGNANCY.   [place]  12/11/2018-  Uterus-5.7 x 2.5 x 4.0 cm no fibroids or other masses visualized Endometrium-thickness 2.1 mm.  Focal echogenic area involving the endometrial stripe measuring 4 x 4 x 5 mm.  No associated vascularity.  Somewhat isodense hypoechoic lesion at the level of the lower uterine segment/cervix measuring 1.9 x 0.8 x 1.0 cm, indeterminate.  Right ovary-not visualized.  No adnexal mass.  Left ovary- 2.2 x 1.4 x 1.4 cm with 0.9 x 0.6 x 0.6 cm shadowing echogenic lesion seen adjacent to left ovary, indeterminate.  There is a question of a hypoechoic complex cystic lesion measuring 1.8 cm.  No appreciable solid  component.  She was referred to GYN oncology for evaluation of left ovarian mass/possible cyst.   Problem List: Patient Active Problem List   Diagnosis Date Noted  . Postmenopausal bleeding 09/30/2018  . Thickened endometrium 09/30/2018  . Bilateral shoulder pain 09/20/2018  . Bradycardia 07/22/2018  . Second degree AV block, Mobitz type II 07/22/2018  . Intermittent claudication ([place]) 03/21/2018  . Exertional dyspnea 03/21/2018  . Greater trochanteric bursitis of both hips 12/18/2017  . Prediabetes 09/19/2017  . Stiffness of right hand joint 08/28/2017  . Bilateral leg edema 08/28/2017  . 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 Allergic rhinitis 06/14/2017  . Acute right ankle pain 06/14/2017  . History of carotid stenosis 06/14/2017  . Psoriatic arthritis ([place]) 06/14/2017  . Lichen sclerosus   . GERD (gastroesophageal reflux disease)   . Hypertension 03/02/2017  . Hyperlipidemia 03/02/2017  . OSA (obstructive sleep apnea) 03/02/2017  . Eye exam, routine 03/02/2017  . Osteopenia 03/02/2017    Past Medical History: Past Medical History:  Diagnosis Date  . Atypical chest pain    a. 10/2018 MV: EF 57%. No ischemia/infarct.  . Diastolic dysfunction    a. 04/2018 Echo: EF 55-60%, no rwma, Gr1 DD. Triv AI, mild MR. Mod dil LA. Nl RV fxn. PASP nl.  . GERD (gastroesophageal reflux disease)   . Headache    "dull one sometimes daily, at least weekly in last couple months" (07/22/2018)  . Hyperlipidemia   . Hypertension   . 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 Lichen sclerosus   . Osteopenia   . Pneumonia 2012? X 1  . 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 Psoriasis   .  Psoriatic arthritis ([place])    " dx'd the 1st of this year, 2019" (07/22/2018)  . Rosacea   . Seborrheic keratosis   . Second degree heart block    a. 07/2018 s/p SJM Assurity MRI model [id number] (Ser# [id number]).  . Sleep apnea    "using nasal pilllows since ~ 12/2017" (07/22/2018)    Past Surgical  History: Past Surgical History:  Procedure Laterality Date  . APPENDECTOMY    . BREAST BIOPSY Left 1978   "benign"  . BREAST CYST ASPIRATION Bilateral   . COLONOSCOPY    . HYSTEROSCOPY W/D&C N/A 11/05/2018   Procedure: DILATATION AND CURETTAGE /HYSTEROSCOPY;  Surgeon: Homero Fellers, MD;  Location: [facility] ORS;  Service: Gynecology;  Laterality: N/A;  . PACEMAKER IMPLANT N/A 07/23/2018   SJM Assurity 2272 implanted by Dr Rayann Heman for mobitz II second degree AV block    Past Gynecologic History:  Menarche: {NUMBERS 0-12:18577} Menstrual details: Lasts {NUMBERS 0-12:18577} days, {DESC; menstrual flow:21146} Menses regular: {yes no:20984} Last Menstrual Period: *** History of OCP/HRT use: *** History of Abnormal pap: {yes no:20984}, {PAP RESULT:21077} Last pap: {Blank multiple:19196} History of STDs: {STD history:20597} Contraception: {contraceptive methods:21883} Sexually active: {yes no unk:32069}  OB History:  OB History  Gravida Para Term Preterm AB Living  0 0 0 0 0 0  SAB TAB Ectopic Multiple Live Births  0 0 0 0 0    Family History: Family History  Problem Relation Age of Onset  . Stroke Mother   . Atrial fibrillation Mother   . Breast cancer Mother   . Stroke Father   . Breast cancer Maternal Aunt   . Breast cancer Paternal Aunt     Social History: Social History   Socioeconomic History  . Marital status: Single    Spouse name: Not on file  . Number of children: Not on file  . Years of education: Not on file  . Highest education level: Not on file  Occupational History  . Not on file  Social Needs  . Financial resource strain: Not hard at all  . Food insecurity:    Worry: Never true    Inability: Never true  . Transportation needs:    Medical: No    Non-medical: No  Tobacco Use  . Smoking status: Former Smoker    Types: Cigarettes  . Smokeless tobacco: Never Used  . Tobacco comment: 07/22/2018 "smoked some in college; 1960s;  nothing since"   Substance and Sexual Activity  . Alcohol use: Yes    Comment: rarely  . Drug use: Never  . Sexual activity: Not Currently    Birth control/protection: Post-menopausal  Lifestyle  . Physical activity:    Days per week: 2 days    Minutes per session: 60 min  . Stress: Not at all  Relationships  . Social connections:    Talks on phone: Not on file    Gets together: Not on file    Attends religious service: Not on file    Active member of club or organization: Not on file    Attends meetings of clubs or organizations: Not on file    Relationship status: Not on file  . Intimate partner violence:    Fear of current or ex partner: Not on file    Emotionally abused: Not on file    Physically abused: Not on file    Forced sexual activity: Not on file  Other Topics Concern  . Not on file  Social History Narrative  .  Not on file    Allergies: No Known Allergies  Current Medications: Current Outpatient Medications  Medication Sig Dispense Refill  . acetaminophen (TYLENOL) 325 MG tablet Take 325-650 mg by mouth every 6 (six) hours as needed for mild pain or headache.     . calcium carbonate (TUMS EX) 750 MG chewable tablet Chew 2 tablets by mouth as needed for heartburn.    . Cholecalciferol (VITAMIN D3) 50 MCG (2000 UT) TABS Take 2,000 Units by mouth daily.    . Clobetasol Prop Emollient Base (CLOBETASOL PROPIONATE E) 0.05 % emollient cream Apply 1 application topically every Sunday.     [name] desonide (DESOWEN) 0.05 % ointment Apply 1 application topically 2 (two) times daily as needed (for psoriasis).     [name] diclofenac sodium (VOLTAREN) 1 % GEL Apply 4 g topically 4 (four) times daily. (Patient not taking: Reported on 11/04/2018) 1 Tube 3  . HUMIRA PEN 40 MG/0.4ML PNKT Inject 0.4 mLs into the skin every 14 (fourteen) days.    [name] lidocaine (LIDODERM) 5 % Place 1 patch onto the skin daily. Remove & Discard patch within 12 hours. (Patient not taking: Reported on 11/04/2018) 30 patch 0  .  metroNIDAZOLE (METROCREAM) 0.75 % cream Apply topically 2 (two) times daily as needed (facial redness/irritation.).     [name] nystatin cream (MYCOSTATIN) Apply 1 application topically 2 (two) times daily as needed for dry skin.     . pantoprazole (PROTONIX) 40 MG tablet Take 1 tablet (40 mg total) by mouth daily. (Patient taking differently: Take 40 mg by mouth daily before breakfast. 30 minutes prior to breakfast) 90 tablet 3  . rosuvastatin (CRESTOR) 20 MG tablet Take 10 mg by mouth every Monday, Wednesday, and Friday. At night.    . tacrolimus (PROTOPIC) 0.1 % ointment Apply topically 2 (two) times daily as needed (for psoriasis).     . valsartan (DIOVAN) 80 MG tablet Take 1 tablet (80 mg total) by mouth daily. 90 tablet 3   No current facility-administered medications for this visit.     Review of Systems General: {Findings; ROS constitutional:30497::"negative for","fevers","chills","fatigue","changes in sleep","changes in weight or appetite"} Skin: {ros; skin:310673::"negative for","changes in color, texture, moles or lesions"} Eyes: {Findings; ROS eyes:30500::"negative for","changes in vision","pain","diplopia"} HEENT: {Findings; ROS HEENT:30502::"negative for","change in hearing","pain","discharge","tinnitus","vertigo","voice changes","sore throat","neck masses"} Breasts: {ros breast:311036} Pulmonary: {Findings; ROS respiratory:30504::"negative for","dyspnea","orthopnea","productive cough"} Cardiac: {Findings; ROS cardiac:30506::"negative for","palpitations","syncope","pain","discomfort","pressure"} Gastrointestinal: {Findings; ROS gastrointestinal:30513::"negative for","dysphagia","nausea","vomiting","jaundice","pain","constipation","diarrhea","hematemesis","hematochezia"} Genitourinary/Sexual: {Findings; ROS genitourinary:30516::"negative for","dysuria","discharge","hesitancy","nocturia","retention","stones","infections","STD's","incontinence"} Ob/Gyn: {Gyn ros:5267::"negative  for","irregular bleeding","pain"} Musculoskeletal: {Findings; ROS musculoskeletal:30524::"negative for","pain","stiffness","swelling","range of motion limitation"} Hematology: {Heme ros:13436::"negative for","easy bruising","bleeding"} Neurologic/Psych: {Findings; ROS neuro:30532::"negative for","headaches","seizures","paralysis","weakness","tremor","change in gait","change in sensation","mood swings","depression","anxiety","change in memory"}  Objective:  Physical Examination:  There were no vitals taken for this visit.    ECOG Performance Status: {DESC; ECOG PERFORMANCE STATUS (NQF 385):19948:p}  General appearance: {Exam; general:16600::"alert","cooperative","appears stated age"} HEENT: {HEENT:32174} Neck: {neck:32177} Lymph node survey: {pe lymph nodes:310023::"axillary","inguinal","supraclavicular","non-palpable"} Cardiovascular: {cardiovascular:32178} Respiratory: {respiratory:32179} Breast exam: {pe breast exam:315056::"breasts appear normal, no suspicious masses, no skin or nipple changes or axillary nodes"}. Abdomen: {GI:32181} Back: {back exam:311380::"inspection of back is normal"} Extremities: {PE; Extremities Gen ULA:45364} Skin exam - {skin exam:315960::"normal coloration and turgor, no rashes, no suspicious skin lesions noted"}. Neurological exam reveals {WOEHO:122482::"NOIBB, oriented, normal speech, no focal findings or movement disorder noted"}.  Pelvic: EGBUS: no lesions Cervix: no lesions, nontender, mobile Vagina: no lesions, no discharge or bleeding Uterus: normal size, nontender, mobile Adnexa: no palpable masses Rectovaginal: confirmatory  Lab Review Labs on site today: ***  Radiologic Imaging: ***  Assessment:  HASSIE MANDT is a 78 y.o. female diagnosed with {insert grade of cancer if applicable} {GYN ONC UTMLYYTKP:54656}. Medical co-morbidities complicating care: {CLEXNTZGYFVCB:44967}.  Plan:   Problem List Items Addressed This Visit    None       We discussed options for management including ***. Based on *** , we recommend {GYN ONC Reccommendation:32802}.  {Insert risk statement for surgery if preop, .gynoncsurrisk} Suggested return to clinic in  {1-10:18281} {units:10146}.    The patient's diagnosis, an outline of the further diagnostic and laboratory studies which will be required, the recommendation for surgery, and alternatives were discussed with her and her accompanying family members.  All questions were answered to their satisfaction.  A total of *** minutes were spent with the patient/family today; ***% was spent in education, counseling and coordination of care for {GYN ONC Diagnosis:32727}.    Verlon Au, NP    CC:  Homero Fellers, MD [place], [place] [address] [phone]

## 2018-12-16 NOTE — Telephone Encounter (Signed)
Called patient in regards to appointment with Dr. Theora Gianotti on 12/18/2018. Message left to return call. Dr. Theora Gianotti recommends rescheduling in 4-6 weeks if patient is asymptomatic.

## 2018-12-18 ENCOUNTER — Inpatient Hospital Stay: Payer: Medicare Other

## 2018-12-19 ENCOUNTER — Encounter: Payer: Self-pay | Admitting: Family Medicine

## 2018-12-19 ENCOUNTER — Ambulatory Visit (INDEPENDENT_AMBULATORY_CARE_PROVIDER_SITE_OTHER): Payer: Medicare Other | Admitting: Family Medicine

## 2018-12-19 ENCOUNTER — Other Ambulatory Visit: Payer: Self-pay

## 2018-12-19 VITALS — BP 122/70 | HR 94 | Temp 98.4°F | Resp 18 | Ht 65.0 in | Wt 176.0 lb

## 2018-12-19 DIAGNOSIS — R059 Cough, unspecified: Secondary | ICD-10-CM

## 2018-12-19 DIAGNOSIS — R05 Cough: Secondary | ICD-10-CM

## 2018-12-19 DIAGNOSIS — J069 Acute upper respiratory infection, unspecified: Secondary | ICD-10-CM | POA: Diagnosis not present

## 2018-12-19 LAB — POC INFLUENZA A&B (BINAX/QUICKVUE)
Influenza A, POC: NEGATIVE
Influenza B, POC: NEGATIVE

## 2018-12-19 LAB — POCT RAPID STREP A (OFFICE): Rapid Strep A Screen: NEGATIVE

## 2018-12-19 MED ORDER — GUAIFENESIN ER 600 MG PO TB12: 600.0000 mg | ORAL_TABLET | Freq: Two times a day (BID) | ORAL | 1 refills | Status: DC

## 2018-12-19 NOTE — Progress Notes (Signed)
Subjective:    Patient ID: Alison Huffman, female    DOB: 01/02/1941, 78 y.o.   MRN: [id number]  HPI   Presents to clinic complaining of a cough with some phlegm, congestion and fever of 99 for 2 to 3 days.  Patient has had no travel outside of [place], no contact with anyone that she knows of who is sick or under investigation for coronavirus/positive for coronavirus.  Denies shortness of breath or wheezing.  Denies chest pain.  Denies body aches does have some fatigue.  Has only taken over-the-counter throat lozenges and some zinc/vitamin C tablets for treatment of symptoms.  Patient Active Problem List   Diagnosis Date Noted  . Postmenopausal bleeding 09/30/2018  . Thickened endometrium 09/30/2018  . Bilateral shoulder pain 09/20/2018  . Bradycardia 07/22/2018  . Second degree AV block, Mobitz type II 07/22/2018  . Intermittent claudication ([place]) 03/21/2018  . Exertional dyspnea 03/21/2018  . Greater trochanteric bursitis of both hips 12/18/2017  . Prediabetes 09/19/2017  . Stiffness of right hand joint 08/28/2017  . Bilateral leg edema 08/28/2017  . 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 Allergic rhinitis 06/14/2017  . Acute right ankle pain 06/14/2017  . History of carotid stenosis 06/14/2017  . Psoriatic arthritis ([place]) 06/14/2017  . Lichen sclerosus   . GERD (gastroesophageal reflux disease)   . Hypertension 03/02/2017  . Hyperlipidemia 03/02/2017  . OSA (obstructive sleep apnea) 03/02/2017  . Eye exam, routine 03/02/2017  . Osteopenia 03/02/2017   Social History   Tobacco Use  . Smoking status: Former Smoker    Types: Cigarettes  . Smokeless tobacco: Never Used  . Tobacco comment: 07/22/2018 "smoked some in college; 1960s;  nothing since"  Substance Use Topics  . Alcohol use: Yes    Comment: rarely   Review of Systems  Constitutional: Negative for chills. +fatigue and fever.  HENT: Some nasal congestion. Negative for ear pain, sinus pain and sore throat.    Eyes: Negative.   Respiratory: +cough, some phlegm at times. Negative for shortness of breath and wheezing.   Cardiovascular: Negative for chest pain, palpitations and leg swelling.  Gastrointestinal: Negative for abdominal pain, diarrhea, nausea and vomiting.  Genitourinary: Negative for dysuria, frequency and urgency.  Musculoskeletal: Negative for arthralgias and myalgias.  Skin: Negative for color change, pallor and rash.  Neurological: Negative for syncope, light-headedness and headaches.  Psychiatric/Behavioral: The patient is not nervous/anxious.       Objective:   Physical Exam Vitals signs and nursing note reviewed.  Constitutional:      General: She is not in acute distress.    Appearance: She is not ill-appearing, toxic-appearing or diaphoretic.  HENT:     Head: Normocephalic and atraumatic.     Nose: Congestion and rhinorrhea (clear) present.     Mouth/Throat:     Mouth: Mucous membranes are moist.     Pharynx: No oropharyngeal exudate or posterior oropharyngeal erythema.  Eyes:     General: No scleral icterus.       Right eye: No discharge.        Left eye: No discharge.     Extraocular Movements: Extraocular movements intact.     Conjunctiva/sclera: Conjunctivae normal.     Pupils: Pupils are equal, round, and reactive to light.  Neck:     Musculoskeletal: Neck supple. No neck rigidity.  Cardiovascular:     Rate and Rhythm: Normal rate and regular rhythm.  Pulmonary:     Effort: Pulmonary effort is normal. No respiratory  distress.     Breath sounds: Normal breath sounds. No stridor. No wheezing, rhonchi or rales.  Lymphadenopathy:     Cervical: No cervical adenopathy.  Skin:    General: Skin is warm and dry.     Coloration: Skin is not jaundiced or pale.  Neurological:     Mental Status: She is alert and oriented to person, place, and time.  Psychiatric:        Mood and Affect: Mood normal.        Behavior: Behavior normal.     Vitals:   12/19/18  0815  BP: 122/70  Pulse: 94  Resp: 18  Temp: 98.4 F (36.9 C)  SpO2: 96%      Assessment & Plan:    Viral URI with cough - point-of-care flu and strep swabs are negative in clinic.  Patient does not meet testing criteria for coronavirus.  Suspect her symptoms are related to a mild viral URI.  She will take Mucinex to help reduce cough symptoms and thin secretions.  Advised to rest, stay home as much as possible and do good handwashing.  Advised to monitor cell for any worsening of symptoms including development of high fever that is not reduced by taking Tylenol, development of shortness of breath.  If high fever or shortness of breath develops, advised to call right away and let us know and or seek emergency treatment if office is not available.  Patient requesting antibiotics due to history of getting a pneumonia, advised patient that her lungs are clear on exam, and her symptoms are consis and doing antibiotics when they are not necessary is not a good plan of care.  Antibiotic overuse causes antibiotic resistance.  Keep regular follow up as planned. RTC sooner if needed.

## 2018-12-19 NOTE — Patient Instructions (Addendum)
Rest, fluids, hand washing, remain home as much as possible  Mucinex to thin secretions/calm cough     Upper Respiratory Infection, Adult An upper respiratory infection (URI) affects the nose, throat, and upper air passages. URIs are caused by germs (viruses). The most common type of URI is often called "the common cold." Medicines cannot cure URIs, but you can do things at home to relieve your symptoms. URIs usually get better within 7-10 days. Follow these instructions at home: Activity  Rest as needed.  If you have a fever, stay home from work or school until your fever is gone, or until your doctor says you may return to work or school. ? You should stay home until you cannot spread the infection anymore (you are not contagious). ? Your doctor may have you wear a face mask so you have less risk of spreading the infection. Relieving symptoms  Gargle with a salt-water mixture 3-4 times a day or as needed. To make a salt-water mixture, completely dissolve -1 tsp of salt in 1 cup of warm water.  Use a cool-mist humidifier to add moisture to the air. This can help you breathe more easily. Eating and drinking   Drink enough fluid to keep your pee (urine) pale yellow.  Eat soups and other clear broths. General instructions   Take over-the-counter and prescription medicines only as told by your doctor. These include cold medicines, fever reducers, and cough suppressants.  Do not use any products that contain nicotine or tobacco. These include cigarettes and e-cigarettes. If you need help quitting, ask your doctor.  Avoid being where people are smoking (avoid secondhand smoke).  Make sure you get regular shots and get the flu shot every year.  Keep all follow-up visits as told by your doctor. This is important. How to avoid spreading infection to others   Wash your hands often with soap and water. If you do not have soap and water, use hand sanitizer.  Avoid touching your  mouth, face, eyes, or nose.  Cough or sneeze into a tissue or your sleeve or elbow. Do not cough or sneeze into your hand or into the air. Contact a doctor if:  You are getting worse, not better.  You have any of these: ? A fever. ? Chills. ? Brown or red mucus in your nose. ? Yellow or brown fluid (discharge)coming from your nose. ? Pain in your face, especially when you bend forward. ? Swollen neck glands. ? Pain with swallowing. ? White areas in the back of your throat. Get help right away if:  You have shortness of breath that gets worse.  You have very bad or constant: ? Headache. ? Ear pain. ? Pain in your forehead, behind your eyes, and over your cheekbones (sinus pain). ? Chest pain.  You have long-lasting (chronic) lung disease along with any of these: ? Wheezing. ? Long-lasting cough. ? Coughing up blood. ? A change in your usual mucus.  You have a stiff neck.  You have changes in your: ? Vision. ? Hearing. ? Thinking. ? Mood. Summary  An upper respiratory infection (URI) is caused by a germ called a virus. The most common type of URI is often called "the common cold."  URIs usually get better within 7-10 days.  Take over-the-counter and prescription medicines only as told by your doctor. This information is not intended to replace advice given to you by your health care provider. Make sure you discuss any questions you have with your  health care provider. Document Released: 03/06/2008 Document Revised: 05/11/2017 Document Reviewed: 05/11/2017 Elsevier Interactive Patient Education  2019 [employer].

## 2018-12-23 DIAGNOSIS — M810 Age-related osteoporosis without current pathological fracture: Secondary | ICD-10-CM | POA: Diagnosis not present

## 2018-12-23 DIAGNOSIS — Z8719 Personal history of other diseases of the digestive system: Secondary | ICD-10-CM | POA: Diagnosis not present

## 2018-12-30 ENCOUNTER — Encounter: Payer: Self-pay | Admitting: Obstetrics and Gynecology

## 2018-12-30 ENCOUNTER — Ambulatory Visit (INDEPENDENT_AMBULATORY_CARE_PROVIDER_SITE_OTHER): Payer: Medicare Other | Admitting: Obstetrics and Gynecology

## 2018-12-30 ENCOUNTER — Other Ambulatory Visit: Payer: Self-pay

## 2018-12-30 ENCOUNTER — Telehealth: Payer: Self-pay | Admitting: Obstetrics and Gynecology

## 2018-12-30 DIAGNOSIS — N83202 Unspecified ovarian cyst, left side: Secondary | ICD-10-CM | POA: Diagnosis not present

## 2018-12-30 DIAGNOSIS — R9389 Abnormal findings on diagnostic imaging of other specified body structures: Secondary | ICD-10-CM

## 2018-12-30 DIAGNOSIS — N950 Postmenopausal bleeding: Secondary | ICD-10-CM

## 2018-12-30 NOTE — Progress Notes (Signed)
Called and discussed with patient. Stable [place], bleeding has resolved. Will follow up in 6 months.

## 2018-12-30 NOTE — Telephone Encounter (Signed)
Patient is aware of ultrasound at [facility] on 07/02/19 @ 1:00pm. Patient is aware she should start drinking 32 oz water one hour prior to test and arrive at 12:45pm w/ a full bladder. Patient is aware she will need a f/u appointment w/ Dr Gilman Schmidt, and that she should contact the office in September for scheduling.

## 2018-12-30 NOTE — Telephone Encounter (Signed)
-----   Message from Homero Fellers, MD sent at 12/30/2018  8:35 AM EDT ----- I ordered and [place] for this patient in 6 months. I would like to have a visit with her after that Korea. Thank you! Dr. Gilman Schmidt

## 2018-12-30 NOTE — Progress Notes (Signed)
Virtual Visit via Telephone Note  I connected with STEPHAINE Huffman on 12/30/18 at  8:10 AM EDT by telephone and verified that I am speaking with the correct person using two identifiers.   I discussed the limitations, risks, security and privacy concerns of performing an evaluation and management service by telephone and the availability of in person appointments. I also discussed with the patient that there may be a patient responsible charge related to this service. The patient expressed understanding and agreed to proceed.  The patient was at home I spoke with the patient from my  office The names of people involved in this encounter were: Sharlet Salina and Dr. Gilman Schmidt.   History of Present Illness: Alison Huffman has been feeling well. She has had no further vaginal bleeding. Her visit with [facility] was moved to April because of the COVID-19 outbreak. She has been practicing social distancing, but she does live in an assisted living facility.    Observations/Objective:   Physical Exam could not be performed. Because of the COVID-19 outbreak this visit was performed over the phone and not in person.   Assessment and Plan: 78 yo with hx of postmenopausal bleeding and thickened endometrium. Hysteroscopy D&C showed endometrial polyps which were removed. Normal pathology. [place] is stable from 3 months ago. Endocervical thickening and left ovarian cyst remain unchanged. Will continue to monitor. Will repeat US in 6 months.   Follow Up Instructions: [place] and telephone or  in-person visit in 6 months.    I discussed the assessment and treatment plan with the patient. The patient was provided an opportunity to ask questions and all were answered. The patient agreed with the plan and demonstrated an understanding of the instructions.   The patient was advised to call back or seek an in-person evaluation if the symptoms worsen or if the condition fails to improve as anticipated.  I provided 20 minutes of  non-face-to-face time during this encounter.  Adrian Prows MD [facility], [facility] 12/30/2018 8:33 AM

## 2019-01-10 ENCOUNTER — Telehealth: Payer: Self-pay

## 2019-01-10 NOTE — Telephone Encounter (Signed)
Spoke with Ms. Weissman. We have changed her visit to a webex. I will call her back Tuesday and to ensure she can get connected. I have emailed her the instruction sheet to assist with downloading webex and her appointment details.

## 2019-01-10 NOTE — Telephone Encounter (Signed)
Voicemail left with Alison Huffman to return call regarding appointment with Dr. Theora Gianotti 01-15-19. Due to the COVID-19 pandemic we would like to convert this visit to a webex or telephone visit. I left her instructions to call and leave me a message regarding. I will try her again on Monday.

## 2019-01-14 ENCOUNTER — Other Ambulatory Visit: Payer: Self-pay

## 2019-01-15 ENCOUNTER — Ambulatory Visit (INDEPENDENT_AMBULATORY_CARE_PROVIDER_SITE_OTHER): Payer: Medicare Other | Admitting: *Deleted

## 2019-01-15 ENCOUNTER — Encounter: Payer: Self-pay | Admitting: Obstetrics and Gynecology

## 2019-01-15 ENCOUNTER — Inpatient Hospital Stay: Payer: Medicare Other | Attending: Obstetrics and Gynecology | Admitting: Obstetrics and Gynecology

## 2019-01-15 DIAGNOSIS — N83202 Unspecified ovarian cyst, left side: Secondary | ICD-10-CM | POA: Diagnosis not present

## 2019-01-15 DIAGNOSIS — I441 Atrioventricular block, second degree: Secondary | ICD-10-CM | POA: Diagnosis not present

## 2019-01-15 LAB — CUP PACEART REMOTE DEVICE CHECK
Battery Remaining Longevity: 134 mo
Battery Remaining Percentage: 95.5 %
Battery Voltage: 3.02 V
Brady Statistic AP VP Percent: 1 %
Brady Statistic AP VS Percent: 9.2 %
Brady Statistic AS VP Percent: 1 %
Brady Statistic AS VS Percent: 88 %
Brady Statistic RA Percent Paced: 5.3 %
Brady Statistic RV Percent Paced: 1 %
Date Time Interrogation Session: 20200415120405
Implantable Lead Implant Date: 20191022
Implantable Lead Implant Date: 20191022
Implantable Lead Location: 753859
Implantable Lead Location: 753860
Implantable Pulse Generator Implant Date: 20191022
Lead Channel Impedance Value: 660 Ohm
Lead Channel Impedance Value: 730 Ohm
Lead Channel Pacing Threshold Amplitude: 0.5 V
Lead Channel Pacing Threshold Amplitude: 0.75 V
Lead Channel Pacing Threshold Pulse Width: 0.5 ms
Lead Channel Pacing Threshold Pulse Width: 0.5 ms
Lead Channel Sensing Intrinsic Amplitude: 12 mV
Lead Channel Sensing Intrinsic Amplitude: 5 mV
Lead Channel Setting Pacing Amplitude: 1 V
Lead Channel Setting Pacing Amplitude: 2 V
Lead Channel Setting Pacing Pulse Width: 0.5 ms
Lead Channel Setting Sensing Sensitivity: 2 mV
Pulse Gen Model: 2272
Pulse Gen Serial Number: 9073848

## 2019-01-15 NOTE — Progress Notes (Signed)
Gynecologic Oncology Consult Visit   Virtual Visit via Telephone Note   I connected with Alison Huffman on 01/15/19 at 11:30 AM EST by video enabled telemedicine visit, and verified that I am speaking with the correct person using two identifiers.   I discussed the limitations, risks, security and privacy concerns of performing an evaluation and management service by telemedicine and the availability of in-person appointments. I also discussed with the patient that there may be a patient responsible charge related to this service. The patient expressed understanding and agreed to proceed.   Other persons participating in the visit and their role in the encounter:   Patient's location: home  Provider's location:   Chief Complaint: post menopausal bleeding & ovarian cyst  Referring Provider: Dr. Gilman Schmidt  Chief Complaint: ovarian cyst  Subjective:  Alison Huffman is a 78 y.o. female who is seen in consultation from Dr. Gilman Schmidt for post menopausal bleeding   She initially presented to Dr. Gilman Schmidt which occurred on 09/06/2018, described as bright red and no bleeding since that time. Menopause at age 17. Hx of lichen sclerosis which is managed with once weekly clobetasol, managed by dermatology. Normal colonoscopy ~ 3 years ago.   Ultrasound- 09/30/2018 Uterus- Anteverted, measures 6.2 x 3.8 x 2.6cm. Homogenous w/o evidence of focal masses Endometrium/endocervix is thickened and heterogeneous measuring 8.6 mm and endocervical measuring 13.109mm.  Right Ovary measures 1.9 x 1.4 x 1.1cm. It is normal in appearance. Left Ovary measures 2.3 x 2.2 x 1.8cm. Tissue appears abnormal. Questionable hypoechoic lesion 1.9cm. Does not appear cystic.  Survey of the adnexa demonstrates no adnexal masses. There is no free fluid in the cul de sac. Impression: 1. Thickened and heterogeneous endometrium. 2. Abnormal appearing left ovary.    CA 125 8.5 (normal) HE4  70.9 (normal) Postmenopausal ROMA 1.10  (normal)  D&C-  DIAGNOSIS:  A. ENDOCERVIX; CURETTAGE:  - SCANT MINUTE FRAGMENTS OF BENIGN ENDOCERVICAL TISSUE AND SQUAMOUS EPITHELIUM.   B. ENDOMETRIUM; POLYPECTOMY AND SAMPLING:  - ENDOMETRIAL POLYP FRAGMENTS.  - ENDOCERVICAL POLYP FRAGMENTS.  - SCANT MINUTE FRAGMENTS OF ATROPHIC ENDOMETRIUM, ENDOCERVICAL TISSUE, AND SQUAMOUS EPITHELIUM.  - NEGATIVE FOR ATYPIA AND MALIGNANCY.   Ultrasound- 12/10/2017 Left ovary- Measurements: 2.2 x 1.4 x 1.4 cm = volume: 2.2 mL. 0.9 x 0.6 x 0.6 cm shadowing echogenic lesion seen adjacent to the left ovary, indeterminate.  1. Question approximate 1.8 cm hypoechoic complex left ovarian cystic lesion, indeterminate. Short interval follow-up ultrasound in approximately 6 months suggested for further evaluation. 2. 5 mm echogenic focus involving the mid endometrium, with additional 1.9 cm hypoechoic lesion at the lower uterine segment/cervix. Findings are indeterminate, and could reflect postoperative changes and/or polyps as seen on recent dilatation and curettage. Correlation with previous procedural findings recommended. 3. Otherwise normal uterus. 4. Nonvisualization of the right ovary.    Today, she says that she hasn't had any additional episodes of bleeding. She has occasional pain but feels this pain be related to anxiety or stress of 'knowing something is there'.    Problem List: Patient Active Problem List   Diagnosis Date Noted  . Postmenopausal bleeding 09/30/2018  . Thickened endometrium 09/30/2018  . Bilateral shoulder pain 09/20/2018  . Bradycardia 07/22/2018  . Second degree AV block, Mobitz type II 07/22/2018  . Intermittent claudication ([place]) 03/21/2018  . Exertional dyspnea 03/21/2018  . Greater trochanteric bursitis of both hips 12/18/2017  . Prediabetes 09/19/2017  . Stiffness of right hand joint 08/28/2017  . Bilateral leg edema 08/28/2017  .  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 Allergic rhinitis 06/14/2017  . Acute right ankle  pain 06/14/2017  . History of carotid stenosis 06/14/2017  . Psoriatic arthritis ([place]) 06/14/2017  . Lichen sclerosus   . GERD (gastroesophageal reflux disease)   . Hypertension 03/02/2017  . Hyperlipidemia 03/02/2017  . OSA (obstructive sleep apnea) 03/02/2017  . Eye exam, routine 03/02/2017  . Osteopenia 03/02/2017    Past Medical History: Past Medical History:  Diagnosis Date  . Atypical chest pain    a. 10/2018 MV: EF 57%. No ischemia/infarct.  . Diastolic dysfunction    a. 04/2018 Echo: EF 55-60%, no rwma, Gr1 DD. Triv AI, mild MR. Mod dil LA. Nl RV fxn. PASP nl.  . GERD (gastroesophageal reflux disease)   . Headache    "dull one sometimes daily, at least weekly in last couple months" (07/22/2018)  . Hyperlipidemia   . Hypertension   . 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 Lichen sclerosus   . Osteopenia   . Pneumonia 2012? X 1  . 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 Psoriasis   . Psoriatic arthritis ([place])    " dx'd the 1st of this year, 2019" (07/22/2018)  . Rosacea   . Seborrheic keratosis   . Second degree heart block    a. 07/2018 s/p SJM Assurity MRI model PM2271 (Ser# [id number]).  . Sleep apnea    "using nasal pilllows since ~ 12/2017" (07/22/2018)    Past Surgical History: Past Surgical History:  Procedure Laterality Date  . APPENDECTOMY    . BREAST BIOPSY Left 1978   "benign"  . BREAST CYST ASPIRATION Bilateral   . COLONOSCOPY    . HYSTEROSCOPY W/D&C N/A 11/05/2018   Procedure: DILATATION AND CURETTAGE /HYSTEROSCOPY;  Surgeon: Homero Fellers, MD;  Location: [facility] ORS;  Service: Gynecology;  Laterality: N/A;  . PACEMAKER IMPLANT N/A 07/23/2018   SJM Assurity [id number] implanted by Dr Rayann Heman for mobitz II second degree AV block    Past Gynecologic History:  Post menopausal.    OB History:  OB History  Gravida Para Term Preterm AB Living  0 0 0 0 0 0  SAB TAB Ectopic Multiple Live Births  0 0 0 0 0    Family History: Family History   Problem Relation Age of Onset  . Stroke Mother   . Atrial fibrillation Mother   . Breast cancer Mother   . Stroke Father   . Breast cancer Maternal Aunt   . Breast cancer Paternal Aunt     Social History: Social History   Socioeconomic History  . Marital status: Single    Spouse name: Not on file  . Number of children: Not on file  . Years of education: Not on file  . Highest education level: Not on file  Occupational History  . Not on file  Social Needs  . Financial resource strain: Not hard at all  . Food insecurity:    Worry: Never true    Inability: Never true  . Transportation needs:    Medical: No    Non-medical: No  Tobacco Use  . Smoking status: Former Smoker    Types: Cigarettes  . Smokeless tobacco: Never Used  . Tobacco comment: 07/22/2018 "smoked some in college; 1960s;  nothing since"  Substance and Sexual Activity  . Alcohol use: Yes    Comment: rarely  . Drug use: Never  . Sexual activity: Not Currently    Birth control/protection: Post-menopausal  Lifestyle  . Physical activity:  Days per week: 2 days    Minutes per session: 60 min  . Stress: Not at all  Relationships  . Social connections:    Talks on phone: Not on file    Gets together: Not on file    Attends religious service: Not on file    Active member of club or organization: Not on file    Attends meetings of clubs or organizations: Not on file    Relationship status: Not on file  . Intimate partner violence:    Fear of current or ex partner: Not on file    Emotionally abused: Not on file    Physically abused: Not on file    Forced sexual activity: Not on file  Other Topics Concern  . Not on file  Social History Narrative  . Not on file    Allergies: No Known Allergies  Current Medications: Current Outpatient Medications  Medication Sig Dispense Refill  . acetaminophen (TYLENOL) 325 MG tablet Take 325-650 mg by mouth every 6 (six) hours as needed for mild pain or  headache.     . calcium carbonate (TUMS EX) 750 MG chewable tablet Chew 2 tablets by mouth as needed for heartburn.    . Cholecalciferol (VITAMIN D3) 50 MCG (2000 UT) TABS Take 2,000 Units by mouth daily.    . Clobetasol Prop Emollient Base (CLOBETASOL PROPIONATE E) 0.05 % emollient cream Apply 1 application topically every Sunday.     [name] desonide (DESOWEN) 0.05 % ointment Apply 1 application topically 2 (two) times daily as needed (for psoriasis).     [name] diclofenac sodium (VOLTAREN) 1 % GEL Apply 4 g topically 4 (four) times daily. 1 Tube 3  . guaiFENesin (MUCINEX) 600 MG 12 hr tablet Take 1 tablet (600 mg total) by mouth 2 (two) times daily. 20 tablet 1  . HUMIRA PEN 40 MG/0.4ML PNKT Inject 0.4 mLs into the skin every 14 (fourteen) days.    [name] lidocaine (LIDODERM) 5 % Place 1 patch onto the skin daily. Remove & Discard patch within 12 hours. 30 patch 0  . metroNIDAZOLE (METROCREAM) 0.75 % cream Apply topically 2 (two) times daily as needed (facial redness/irritation.).     [name] nystatin cream (MYCOSTATIN) Apply 1 application topically 2 (two) times daily as needed for dry skin.     . pantoprazole (PROTONIX) 40 MG tablet Take 1 tablet (40 mg total) by mouth daily. (Patient taking differently: Take 40 mg by mouth daily before breakfast. 30 minutes prior to breakfast) 90 tablet 3  . rosuvastatin (CRESTOR) 20 MG tablet Take 10 mg by mouth every Monday, Wednesday, and Friday. At night.    . tacrolimus (PROTOPIC) 0.1 % ointment Apply topically 2 (two) times daily as needed (for psoriasis).     . valsartan (DIOVAN) 80 MG tablet Take 1 tablet (80 mg total) by mouth daily. 90 tablet 3   No current facility-administered medications for this visit.     Review of Systems General: negative for fevers, chills, fatigue, changes in sleep, changes in weight or appetite Skin: negative for changes in color, texture, moles or lesions Eyes: negative for changes in vision, pain, diplopia HEENT: negative for change in  hearing, pain, discharge, tinnitus, vertigo, voice changes, sore throat, neck masses Breasts: negative for breast lumps Pulmonary: negative for dyspnea, orthopnea, productive cough Cardiac: negative for palpitations, syncope, pain, discomfort, pressure Gastrointestinal: negative for dysphagia, nausea, vomiting, jaundice, pain, constipation, diarrhea, hematemesis, hematochezia Genitourinary/Sexual: negative for dysuria, discharge, hesitancy, nocturia, retention, stones, infections, STD's,  incontinence Ob/Gyn: negative for irregular bleeding, pain Musculoskeletal: negative for pain, stiffness, swelling, range of motion limitation Hematology: negative for easy bruising, bleeding Neurologic/Psych: negative for headaches, seizures, paralysis, weakness, tremor, change in gait, change in sensation, mood swings, depression, anxiety, change in memory   Objective:  Physical Examination:  There were no vitals taken for this visit.    ECOG Performance Status: 0 - Asymptomatic  Exam deferred due to telemedicine.  General: well appearing female in no apparent distress HEENT: Atraumatic and normocephalic Neuro:  Alert and oriented  Lab Review No labs on site today. Labs as discussed in HPI independently reviewed and discussed with the patient.   Radiologic Imaging: Ultrasounds, as discussed in HPI  independently reviewed and discussed with the patient. Images reviewed personally.     Assessment:  Alison Huffman is a 78 y.o. female diagnosed with asymptomatic ovarian cyst and normal tumor markers. Most likely benign; less likely to represent neoplastic process.   Medical co-morbidities complicating care: Atypical chest pain and diastolic dysfunction -  10/6943 Echo: EF 55-60%, no rwma, Gr1 DD. Triv AI, mild MR. Mod dil LA. Nl RV fxn. PASP  Plan:   Problem List Items Addressed This Visit    None    Visit Diagnoses    Cyst of left ovary    -  Primary     We discussed options for management  and agree with Dr. Gilman Schmidt regarding repeating the ultrasound in September 2020. We provided guidelines for the patient regarding ovarian cancer symptoms/signs and to contact either Dr. Gilman Schmidt or our clinic if needed.    The patient's diagnosis, an outline of the further diagnostic and laboratory studies which will be required, the recommendation for surgery, and alternatives were discussed with her and her accompanying family members.  All questions were answered to their satisfaction.  I discussed the assessment and treatment plan with the patient. The patient was provided an opportunity to ask questions and all were answered. The patient agreed with the plan and demonstrated an understanding of the instructions.   The patient was advised to call back or seek an in-person evaluation if the symptoms worsen or if the condition fails to improve as anticipated.   I provided 33 minutes of face-to-face video visit time during this encounter, and > 50% was spent counseling as documented under my assessment & plan.  Verlon Au, NP   I personally had a face to face interaction and evaluated the patient jointly with the NP, Ms. Beckey Rutter.  I have reviewed her history and available records.  I have discussed the case with the APP and the patient.  I agree with the above documentation, assessment and plan which was fully formulated by me.  Counseling was completed by me.   Angeles Gaetana Michaelis, MD   CC:  Homero Fellers, MD [address], [place] [address] [phone]

## 2019-01-21 ENCOUNTER — Ambulatory Visit: Payer: Medicare Other | Admitting: Gastroenterology

## 2019-01-21 NOTE — Progress Notes (Signed)
Remote pacemaker transmission.   

## 2019-01-31 ENCOUNTER — Telehealth: Payer: Self-pay | Admitting: *Deleted

## 2019-01-31 NOTE — Telephone Encounter (Signed)
Patient called requesting that Alison Huffman or Alison Huffman return her call as she has some questions to discuss, but did not say what they were. [phone]

## 2019-02-02 ENCOUNTER — Encounter: Payer: Self-pay | Admitting: Obstetrics and Gynecology

## 2019-02-03 ENCOUNTER — Telehealth: Payer: Self-pay | Admitting: Nurse Practitioner

## 2019-02-03 NOTE — Telephone Encounter (Signed)
Called patient to follow up regarding constipation. She has passed several bowel movements. No pain, nausea or vomiting but continues to feel like her abdomen is swollen or gassy. I have asked her to see call her PCP for evaluation.

## 2019-02-12 DIAGNOSIS — R194 Change in bowel habit: Secondary | ICD-10-CM | POA: Diagnosis not present

## 2019-02-17 ENCOUNTER — Encounter: Admission: RE | Payer: Self-pay | Source: Home / Self Care

## 2019-02-17 ENCOUNTER — Ambulatory Visit
Admission: RE | Admit: 2019-02-17 | Payer: Medicare Other | Source: Home / Self Care | Admitting: Unknown Physician Specialty

## 2019-02-17 SURGERY — ESOPHAGOGASTRODUODENOSCOPY (EGD) WITH PROPOFOL
Anesthesia: General

## 2019-03-06 DIAGNOSIS — Z79899 Other long term (current) drug therapy: Secondary | ICD-10-CM | POA: Diagnosis not present

## 2019-03-06 DIAGNOSIS — L409 Psoriasis, unspecified: Secondary | ICD-10-CM | POA: Diagnosis not present

## 2019-03-06 DIAGNOSIS — L4050 Arthropathic psoriasis, unspecified: Secondary | ICD-10-CM | POA: Diagnosis not present

## 2019-03-24 ENCOUNTER — Ambulatory Visit (INDEPENDENT_AMBULATORY_CARE_PROVIDER_SITE_OTHER): Payer: Medicare Other | Admitting: Family Medicine

## 2019-03-24 ENCOUNTER — Encounter: Payer: Self-pay | Admitting: Family Medicine

## 2019-03-24 ENCOUNTER — Other Ambulatory Visit: Payer: Self-pay

## 2019-03-24 DIAGNOSIS — K219 Gastro-esophageal reflux disease without esophagitis: Secondary | ICD-10-CM | POA: Diagnosis not present

## 2019-03-24 DIAGNOSIS — G4733 Obstructive sleep apnea (adult) (pediatric): Secondary | ICD-10-CM

## 2019-03-24 DIAGNOSIS — E782 Mixed hyperlipidemia: Secondary | ICD-10-CM | POA: Diagnosis not present

## 2019-03-24 DIAGNOSIS — N83202 Unspecified ovarian cyst, left side: Secondary | ICD-10-CM

## 2019-03-24 DIAGNOSIS — M810 Age-related osteoporosis without current pathological fracture: Secondary | ICD-10-CM

## 2019-03-24 DIAGNOSIS — K5900 Constipation, unspecified: Secondary | ICD-10-CM

## 2019-03-24 DIAGNOSIS — I10 Essential (primary) hypertension: Secondary | ICD-10-CM

## 2019-03-24 NOTE — Assessment & Plan Note (Signed)
She will continue to follow with gynecology for this.

## 2019-03-24 NOTE — Assessment & Plan Note (Signed)
She will continue her CPAP through pulmonology.

## 2019-03-24 NOTE — Assessment & Plan Note (Signed)
Well-controlled.  Continue valsartan.  She will come in for lab work.

## 2019-03-24 NOTE — Assessment & Plan Note (Signed)
Undetermined cause.  This has improved.  I did encourage her to follow through with her colonoscopy with GI.

## 2019-03-24 NOTE — Assessment & Plan Note (Signed)
She does seem to have some symptoms consistent with likely gastritis or a gastric ulcer though could also be reflux related.  She will continue on Protonix.  She will continue to follow with GI and she will contact them to follow-up on her EGD getting scheduled.

## 2019-03-24 NOTE — Progress Notes (Signed)
Virtual Visit via video Note  This visit type was conducted due to national recommendations for restrictions regarding the COVID-19 pandemic (e.g. social distancing).  This format is felt to be most appropriate for this patient at this time.  All issues noted in this document were discussed and addressed.  No physical exam was performed (except for noted visual exam findings with Video Visits).   I connected with Alison Huffman today at  9:30 AM EDT by a video enabled telemedicine application and verified that I am speaking with the correct person using two identifiers. Location patient: home Location provider: work  Persons participating in the virtual visit: patient, provider  I discussed the limitations, risks, security and privacy concerns of performing an evaluation and management service by telephone and the availability of in person appointments. I also discussed with the patient that there may be a patient responsible charge related to this service. The patient expressed understanding and agreed to proceed.  Reason for visit: follow-up  HPI: HYPERTENSION  Disease Monitoring  Home BP Monitoring 120s-130/60s Chest pain- no    Dyspnea- no Medications  Compliance-  Taking valsartan.   HYPERLIPIDEMIA Symptoms Chest pain on exertion: No    Medications: Compliance-taking Crestor right upper quadrant pain-no muscle aches-occasional She has been exercising more and is walking 1 mile daily.  She has cut out desserts and has worked on portion control.  Weight is down 5 pounds.  Constipation/GERD: Patient saw GI for this.  They recommended daily MiraLAX.  Patient notes this was beneficial and now she is taking it as needed as she was having very soft bowel movements.  She has a bowel movement every other day now.  She eats prunes to help with this.  She does note occasional epigastric discomfort though no other abdominal pain.  That epigastric discomfort occurs after eating and resolves  with Tums.  She is on Protonix 20 mg daily though she is unsure if that helps.  She does try to avoid the foods that trigger this.  Her appetite is good.  She does produce lots of gas.  No blood in her stool.  She was scheduled for colonoscopy and EGD to evaluate these things though this was delayed given the COVID-19 pandemic.  OSA: She is currently using a CPAP through pulmonology.  Wears this 6 to 7 hours nightly.  No hypersomnia.  She does wake well rested.  Ovarian cyst: This was found on evaluation through gynecology.  She did see [facility] and they recommended ultrasound in September of this year.  Lab evaluation for her ovarian cyst was reassuring.  Osteoporosis: Patient did see endocrinology for this.  They initially advised her that she had osteopenia though after calculating her FRAX score she notes they contacted her let her know she had osteoporosis and they recommended she go on Prolia.  She has deferred this currently with everything that is going on with her other medical issues and COVID-19.     ROS: See pertinent positives and negatives per HPI.  Past Medical History:  Diagnosis Date  . Atypical chest pain    a. 10/2018 MV: EF 57%. No ischemia/infarct.  . Diastolic dysfunction    a. 04/2018 Echo: EF 55-60%, no rwma, Gr1 DD. Triv AI, mild MR. Mod dil LA. Nl RV fxn. PASP nl.  . GERD (gastroesophageal reflux disease)   . Headache    "dull one sometimes daily, at least weekly in last couple months" (07/22/2018)  . Hyperlipidemia   . Hypertension   .  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 Lichen sclerosus   . Osteopenia   . Pneumonia 2012? X 1  . 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 Psoriasis   . Psoriatic arthritis ([place])    " dx'd the 1st of this year, 2019" (07/22/2018)  . Rosacea   . Seborrheic keratosis   . Second degree heart block    a. 07/2018 s/p SJM Assurity MRI model PM2271 (Ser# [id number]).  . Sleep apnea    "using nasal pilllows since ~ 12/2017"  (07/22/2018)    Past Surgical History:  Procedure Laterality Date  . APPENDECTOMY    . BREAST BIOPSY Left 1978   "benign"  . BREAST CYST ASPIRATION Bilateral   . COLONOSCOPY    . HYSTEROSCOPY W/D&C N/A 11/05/2018   Procedure: DILATATION AND CURETTAGE /HYSTEROSCOPY;  Surgeon: Homero Fellers, MD;  Location: [facility] ORS;  Service: Gynecology;  Laterality: N/A;  . PACEMAKER IMPLANT N/A 07/23/2018   SJM Assurity [id number] implanted by Dr Rayann Heman for mobitz II second degree AV block    Family History  Problem Relation Age of Onset  . Stroke Mother   . Atrial fibrillation Mother   . Breast cancer Mother   . Stroke Father   . Breast cancer Maternal Aunt   . Breast cancer Paternal Aunt     SOCIAL HX: former smoker   Current Outpatient Medications:  .  acetaminophen (TYLENOL) 325 MG tablet, Take 325-650 mg by mouth every 6 (six) hours as needed for mild pain or headache. , Disp: , Rfl:  .  calcium carbonate (TUMS EX) 750 MG chewable tablet, Chew 2 tablets by mouth as needed for heartburn., Disp: , Rfl:  .  Cholecalciferol (VITAMIN D3) 50 MCG (2000 UT) TABS, Take 2,000 Units by mouth daily., Disp: , Rfl:  .  Clobetasol Prop Emollient Base (CLOBETASOL PROPIONATE E) 0.05 % emollient cream, Apply 1 application topically every Sunday. , Disp: , Rfl:  .  desonide (DESOWEN) 0.05 % ointment, Apply 1 application topically 2 (two) times daily as needed (for psoriasis). , Disp: , Rfl:  .  HUMIRA PEN 40 MG/0.4ML PNKT, Inject 0.4 mLs into the skin every 14 (fourteen) days., Disp: , Rfl:  .  metroNIDAZOLE (METROCREAM) 0.75 % cream, Apply topically 2 (two) times daily as needed (facial redness/irritation.). , Disp: , Rfl:  .  nystatin cream (MYCOSTATIN), Apply 1 application topically 2 (two) times daily as needed for dry skin. , Disp: , Rfl:  .  pantoprazole (PROTONIX) 40 MG tablet, Take 1 tablet (40 mg total) by mouth daily. (Patient taking differently: Take 40 mg by mouth daily before breakfast. 30  minutes prior to breakfast), Disp: 90 tablet, Rfl: 3 .  rosuvastatin (CRESTOR) 20 MG tablet, Take 10 mg by mouth every Monday, Tuesday, Wednesday, Thursday, and Friday. At night., Disp: , Rfl:  .  tacrolimus (PROTOPIC) 0.1 % ointment, Apply topically 2 (two) times daily as needed (for psoriasis). , Disp: , Rfl:  .  valsartan (DIOVAN) 80 MG tablet, Take 1 tablet (80 mg total) by mouth daily., Disp: 90 tablet, Rfl: 3 .  diclofenac sodium (VOLTAREN) 1 % GEL, Apply 4 g topically 4 (four) times daily. (Patient not taking: Reported on 01/15/2019), Disp: 1 Tube, Rfl: 3 .  famotidine (PEPCID) 40 MG tablet, Take 40 mg by mouth at bedtime., Disp: , Rfl:  .  guaiFENesin (MUCINEX) 600 MG 12 hr tablet, Take 1 tablet (600 mg total) by mouth 2 (two) times daily. (Patient not taking: Reported on  01/15/2019), Disp: 20 tablet, Rfl: 1 .  lidocaine (LIDODERM) 5 %, Place 1 patch onto the skin daily. Remove & Discard patch within 12 hours. (Patient not taking: Reported on 01/15/2019), Disp: 30 patch, Rfl: 0  EXAM:  VITALS per patient if applicable: None.  GENERAL: alert, oriented, appears well and in no acute distress  HEENT: atraumatic, conjunttiva clear, no obvious abnormalities on inspection of external nose and ears  NECK: normal movements of the head and neck  LUNGS: on inspection no signs of respiratory distress, breathing rate appears normal, no obvious gross SOB, gasping or wheezing  CV: no obvious cyanosis  MS: moves all visible extremities without noticeable abnormality  PSYCH/NEURO: pleasant and cooperative, no obvious depression or anxiety, speech and thought processing grossly intact  ASSESSMENT AND PLAN:  Discussed the following assessment and plan:  Hypertension Well-controlled.  Continue valsartan.  She will come in for lab work.  GERD (gastroesophageal reflux disease) She does seem to have some symptoms consistent with likely gastritis or a gastric ulcer though could also be reflux  related.  She will continue on Protonix.  She will continue to follow with GI and she will contact them to follow-up on her EGD getting scheduled.  Cyst of left ovary She will continue to follow with gynecology for this.  Osteoporosis I reviewed the phone message from endocrinology where they listed her FRAX scores and I discussed with her that is how they diagnosed her with osteoporosis.  Based on her FRAX score she would be a candidate for treatment and I encouraged her to follow-up with endocrinology regarding this.  Hyperlipidemia Check lipid panel.  Continue Crestor.  Depending on LDL consider decreasing Crestor dose to 5 mg 5 days a week.  Constipation Undetermined cause.  This has improved.  I did encourage her to follow through with her colonoscopy with GI.  OSA (obstructive sleep apnea) She will continue her CPAP through pulmonology.    I discussed the assessment and treatment plan with the patient. The patient was provided an opportunity to ask questions and all were answered. The patient agreed with the plan and demonstrated an understanding of the instructions.   The patient was advised to call back or seek an in-person evaluation if the symptoms worsen or if the condition fails to improve as anticipated.   Tommi Rumps, MD

## 2019-03-24 NOTE — Assessment & Plan Note (Signed)
Check lipid panel.  Continue Crestor.  Depending on LDL consider decreasing Crestor dose to 5 mg 5 days a week.

## 2019-03-24 NOTE — Assessment & Plan Note (Signed)
I reviewed the phone message from endocrinology where they listed her FRAX scores and I discussed with her that is how they diagnosed her with osteoporosis.  Based on her FRAX score she would be a candidate for treatment and I encouraged her to follow-up with endocrinology regarding this.

## 2019-04-14 ENCOUNTER — Other Ambulatory Visit: Payer: Self-pay | Admitting: Family Medicine

## 2019-04-16 ENCOUNTER — Ambulatory Visit (INDEPENDENT_AMBULATORY_CARE_PROVIDER_SITE_OTHER): Payer: Medicare Other | Admitting: *Deleted

## 2019-04-16 ENCOUNTER — Telehealth: Payer: Self-pay | Admitting: Internal Medicine

## 2019-04-16 DIAGNOSIS — I441 Atrioventricular block, second degree: Secondary | ICD-10-CM

## 2019-04-16 LAB — CUP PACEART REMOTE DEVICE CHECK
Date Time Interrogation Session: 20200715133530
Implantable Lead Implant Date: 20191022
Implantable Lead Implant Date: 20191022
Implantable Lead Location: 753859
Implantable Lead Location: 753860
Implantable Pulse Generator Implant Date: 20191022
Pulse Gen Model: 2272
Pulse Gen Serial Number: 9073848

## 2019-04-16 NOTE — Telephone Encounter (Signed)
I spoke with patient and informed her that remote transmission was scheduled for today @ 7:25 am.  She verbalized understanding.

## 2019-04-16 NOTE — Telephone Encounter (Signed)
New message:    Patient calling stating that she is not sure what she should be doing concering her monitor. Please call patient.

## 2019-04-17 DIAGNOSIS — L400 Psoriasis vulgaris: Secondary | ICD-10-CM | POA: Diagnosis not present

## 2019-04-17 DIAGNOSIS — L900 Lichen sclerosus et atrophicus: Secondary | ICD-10-CM | POA: Diagnosis not present

## 2019-04-21 ENCOUNTER — Other Ambulatory Visit: Payer: Self-pay | Admitting: Family Medicine

## 2019-04-21 MED ORDER — VALSARTAN 80 MG PO TABS: 80.0000 mg | ORAL_TABLET | Freq: Every day | ORAL | 0 refills | Status: DC

## 2019-04-21 NOTE — Telephone Encounter (Signed)
Medication Refill - Medication: valsartan (DIOVAN) 80 MG tablet  Pt states she needs this rx filled through local pharmacy due to it being on back order through mail order pharmacy.   Preferred Pharmacy:  [place], [place] - [place] [phone] (Phone) [phone] (Fax)     Pt was advised that RX refills may take up to 3 business days. We ask that you follow-up with your pharmacy.

## 2019-04-21 NOTE — Telephone Encounter (Signed)
Script sent  

## 2019-04-22 ENCOUNTER — Other Ambulatory Visit: Payer: Self-pay | Admitting: Family Medicine

## 2019-04-22 DIAGNOSIS — Z1231 Encounter for screening mammogram for malignant neoplasm of breast: Secondary | ICD-10-CM

## 2019-04-25 ENCOUNTER — Other Ambulatory Visit: Payer: Self-pay

## 2019-04-25 ENCOUNTER — Other Ambulatory Visit (INDEPENDENT_AMBULATORY_CARE_PROVIDER_SITE_OTHER): Payer: Medicare Other

## 2019-04-25 ENCOUNTER — Encounter: Payer: Self-pay | Admitting: Cardiology

## 2019-04-25 DIAGNOSIS — I10 Essential (primary) hypertension: Secondary | ICD-10-CM

## 2019-04-25 DIAGNOSIS — E782 Mixed hyperlipidemia: Secondary | ICD-10-CM

## 2019-04-25 LAB — COMPREHENSIVE METABOLIC PANEL WITH GFR
ALT: 17 U/L (ref 0–35)
AST: 20 U/L (ref 0–37)
Albumin: 4.1 g/dL (ref 3.5–5.2)
Alkaline Phosphatase: 95 U/L (ref 39–117)
BUN: 12 mg/dL (ref 6–23)
CO2: 22 meq/L (ref 19–32)
Calcium: 9.5 mg/dL (ref 8.4–10.5)
Chloride: 106 meq/L (ref 96–112)
Creatinine, Ser: 0.58 mg/dL (ref 0.40–1.20)
GFR: 100.16 mL/min
Glucose, Bld: 85 mg/dL (ref 70–99)
Potassium: 4.6 meq/L (ref 3.5–5.1)
Sodium: 137 meq/L (ref 135–145)
Total Bilirubin: 0.6 mg/dL (ref 0.2–1.2)
Total Protein: 6.8 g/dL (ref 6.0–8.3)

## 2019-04-25 LAB — LIPID PANEL
Cholesterol: 158 mg/dL (ref 0–200)
HDL: 43.7 mg/dL
LDL Cholesterol: 92 mg/dL (ref 0–99)
NonHDL: 114.69
Total CHOL/HDL Ratio: 4
Triglycerides: 112 mg/dL (ref 0.0–149.0)
VLDL: 22.4 mg/dL (ref 0.0–40.0)

## 2019-04-25 NOTE — Progress Notes (Signed)
Remote pacemaker transmission.   

## 2019-04-28 ENCOUNTER — Telehealth: Payer: Self-pay | Admitting: Cardiovascular Disease

## 2019-04-28 NOTE — Telephone Encounter (Signed)

## 2019-04-28 NOTE — Progress Notes (Signed)
Cardiology Office Note  Date:  04/29/2019   ID:  Alison Huffman, DOB 06-13-1941, MRN [id number]  PCP:  Leone Haven, MD   Chief Complaint  Patient presents with  . Other    6 month follow up. Patient c/o swelling in legs. Meds reviewed verbally with patient.     HPI:  Alison Huffman s a 78 year old woman with past medical history of Hyperlipidemia Hypertension Psoriatic arthritis on methotrexate, "better" OSA, on CPAP Heart block, pacer Who presents for shortness of breath on exertion  BP 120 to 130 at home for blood pressure Elevated today, mild anxiety Does walking daily, 1 mile every morning Weight down  Eating less Less trips to dining room Down 5 pounds  SOB mild, stable  Previously Seen by Dr. Juanell Fairly for sleep apnea On CPAP  previous stress test 2013 in [place]   Previous carotid ultrasound less than 39% disease on the left October 2018  EKG personally reviewed by myself on todays visit shows normal sinus rhythm with rate 66 bpm right bundle branch block   Echo 04/25/2018, results discussed with her today  - Left ventricle: The cavity size was normal. Systolic function was   normal. The estimated ejection fraction was in the range of 55%   to 60%. Wall motion was normal; there were no regional wall   motion abnormalities. Doppler parameters are consistent with   abnormal left ventricular relaxation (grade 1 diastolic   dysfunction). - Aortic valve: There was trivial regurgitation. - Mitral valve: There was mild regurgitation. - Left atrium: The atrium was moderately dilated. - Right ventricle: Systolic function was normal. - Pulmonary arteries: Systolic pressure was within the normal   range. - Inferior vena cava: The vessel was normal in size. The   respirophasic diameter changes were in the normal range (>= 50%),   consistent with normal central venous pressure.  - Frequent APCs noted.   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  PSH:    Past Surgical History:  Procedure Laterality Date  . APPENDECTOMY    . BREAST BIOPSY Left 1978   "benign"  . BREAST CYST ASPIRATION Bilateral   . COLONOSCOPY    . HYSTEROSCOPY W/D&C N/A 11/05/2018   Procedure: DILATATION AND CURETTAGE /HYSTEROSCOPY;  Surgeon: Homero Fellers, MD;  Location: [facility] ORS;  Service: Gynecology;  Laterality: N/A;  . PACEMAKER IMPLANT N/A 07/23/2018   SJM Assurity 2272 implanted by Dr Rayann Heman for mobitz II second degree AV block    Current Outpatient Medications  Medication Sig Dispense Refill  . acetaminophen (TYLENOL) 325 MG tablet Take 325-650 mg by mouth every 6 (six) hours as needed for mild pain or headache.     . calcium carbonate (TUMS EX) 750 MG chewable tablet Chew 2 tablets by mouth as needed for heartburn.    . Cholecalciferol (VITAMIN D3) 50 MCG (2000 UT) TABS Take 2,000 Units by mouth daily.    . Clobetasol Prop Emollient Base (CLOBETASOL PROPIONATE E) 0.05 % emollient cream Apply 1 application topically every Sunday.     [name] desonide (DESOWEN) 0.05 % ointment Apply 1 application topically 2 (two) times daily as needed (for psoriasis).     [name] HUMIRA PEN 40 MG/0.4ML PNKT Inject 0.4  mLs into the skin every 14 (fourteen) days.    . metroNIDAZOLE (METROCREAM) 0.75 % cream Apply topically 2 (two) times daily as needed (facial redness/irritation.).     [name] nystatin cream (MYCOSTATIN) Apply 1 application topically 2 (two) times daily as needed for dry skin.     [name] tacrolimus (PROTOPIC) 0.1 % ointment Apply topically 2 (two) times daily as needed (for psoriasis).     . valsartan (DIOVAN) 80 MG  tablet Take 1 tablet (80 mg total) by mouth daily. 90 tablet 0  . guaiFENesin (MUCINEX) 600 MG 12 hr tablet Take 1 tablet (600 mg total) by mouth 2 (two) times daily. (Patient not taking: Reported on 01/15/2019) 20 tablet 1  . pantoprazole (PROTONIX) 40 MG tablet Take 1 tablet (40 mg total) by mouth daily. (Patient taking differently: Take 40 mg by mouth daily before breakfast. 30 minutes prior to breakfast) 90 tablet 3  . rosuvastatin (CRESTOR) 5 MG tablet Take 1 tablet (5 mg total) by mouth daily. 90 tablet 3   No current facility-administered medications for this visit.      Allergies:   Patient has no known allergies.   Social History:  The patient  reports that she has quit smoking. Her smoking use included cigarettes. She has never used smokeless tobacco. She reports current alcohol use. She reports that she does not use drugs.   Family History:   family history includes Atrial fibrillation in her mother; Breast cancer in her maternal aunt, mother, and paternal aunt; Stroke in her father and mother.    Review of Systems: Review of Systems  Constitutional: Negative.   HENT: Negative.   Respiratory: Positive for shortness of breath.   Cardiovascular: Positive for leg swelling.  Gastrointestinal: Negative.   Musculoskeletal: Negative.   Neurological: Negative.   Psychiatric/Behavioral: Negative.   All other systems reviewed and are negative.   PHYSICAL EXAM: VS:  BP (!) 150/72 (BP Location: Left Arm, Patient Position: Sitting, Cuff Size: Normal)   Pulse 66   Ht 5' 5.5" (1.664 m)   Wt 172 lb 8 oz (78.2 kg)   BMI 28.27 kg/m  , BMI Body mass index is 28.27 kg/m. GEN: Well nourished, well developed, in no acute distress  HEENT: normal  Neck: no JVD, carotid bruits, or masses Cardiac: RRR; no murmurs, rubs, or gallops,no edema  Respiratory:  clear to auscultation bilaterally, normal work of breathing GI: soft, nontender, nondistended, + BS MS: no deformity or atrophy  Skin:  warm and dry, no rash Neuro:  Strength and sensation are intact Psych: euthymic mood, full affect   Recent Labs: 11/05/2018: Hemoglobin 13.4; Platelets 224 04/25/2019: ALT 17; BUN 12; Creatinine, Ser 0.58; Potassium 4.6; Sodium 137    Lipid Panel Lab Results  Component Value Date   CHOL 158 04/25/2019   HDL 43.70 04/25/2019   LDLCALC 92 04/25/2019   TRIG 112.0 04/25/2019      Wt Readings from Last 3 Encounters:  04/29/19 172 lb 8 oz (78.2 kg)  12/19/18 176 lb (79.8 kg)  11/12/18 175 lb (79.4 kg)       ASSESSMENT AND PLAN:  Exertional dyspnea - Plan: EKG 12-Lead Normal echo Continue walking, conditioning No significant fluid  Prediabetes We have encouraged continued exercise, careful diet management in an effort to lose weight.  Bilateral leg edema Venous,  insignificant on today's visit Compression hose as needed  Mixed hyperlipidemia crestor 10 mg 5 times a week She prefers 5 mg daily  Essential hypertension Blood pressure  is well controlled on today's visit. No changes made to the medications.  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 EKG 12-Lead     Signed, Esmond Plants, M.D., Ph.D. 04/29/2019  [place], [place]

## 2019-04-29 ENCOUNTER — Other Ambulatory Visit: Payer: Self-pay

## 2019-04-29 ENCOUNTER — Ambulatory Visit (INDEPENDENT_AMBULATORY_CARE_PROVIDER_SITE_OTHER): Payer: Medicare Other | Admitting: Cardiovascular Disease

## 2019-04-29 ENCOUNTER — Encounter: Payer: Self-pay | Admitting: Cardiovascular Disease

## 2019-04-29 VITALS — BP 150/72 | HR 66 | Ht 65.5 in | Wt 172.5 lb

## 2019-04-29 DIAGNOSIS — R0789 Other chest pain: Secondary | ICD-10-CM | POA: Diagnosis not present

## 2019-04-29 DIAGNOSIS — R0609 Other forms of dyspnea: Secondary | ICD-10-CM

## 2019-04-29 DIAGNOSIS — Z950 Presence of cardiac pacemaker: Secondary | ICD-10-CM

## 2019-04-29 DIAGNOSIS — I441 Atrioventricular block, second degree: Secondary | ICD-10-CM

## 2019-04-29 DIAGNOSIS — I10 Essential (primary) hypertension: Secondary | ICD-10-CM

## 2019-04-29 DIAGNOSIS — R0600 Dyspnea, unspecified: Secondary | ICD-10-CM

## 2019-04-29 DIAGNOSIS — G4733 Obstructive sleep apnea (adult) (pediatric): Secondary | ICD-10-CM | POA: Diagnosis not present

## 2019-04-29 DIAGNOSIS — R7303 Prediabetes: Secondary | ICD-10-CM

## 2019-04-29 MED ORDER — ROSUVASTATIN CALCIUM 5 MG PO TABS: 5.0000 mg | ORAL_TABLET | Freq: Every day | ORAL | 3 refills | Status: DC

## 2019-04-29 NOTE — Patient Instructions (Addendum)
Medication Instructions:  Please change crestor to 5 mg daily, [facility]  If you need a refill on your cardiac medications before your next appointment, please call your pharmacy.    Lab work: No new labs needed   If you have labs (blood work) drawn today and your tests are completely normal, you will receive your results only by: [name] MyChart Message (if you have MyChart) OR . A paper copy in the mail If you have any lab test that is abnormal or we need to change your treatment, we will call you to review the results.   Testing/Procedures: No new testing needed   Follow-Up: At [facility], you and your health needs are our priority.  As part of our continuing mission to provide you with exceptional heart care, we have created designated Provider Care Teams.  These Care Teams include your primary Cardiologist (physician) and Advanced Practice Providers (APPs -  Physician Assistants and Nurse Practitioners) who all work together to provide you with the care you need, when you need it.  . You will need a follow up appointment as needed   . Providers on your designated Care Team:   . Murray Hodgkins, NP . Christell Faith, PA-C . Marrianne Mood, PA-C  Any Other Special Instructions Will Be Listed Below (If Applicable).  For educational health videos Log in to : [email or web address] Or : [email or web address], password : [email or web address]

## 2019-05-05 ENCOUNTER — Encounter: Payer: Self-pay | Admitting: Family Medicine

## 2019-05-08 ENCOUNTER — Encounter: Payer: Self-pay | Admitting: Family Medicine

## 2019-05-13 ENCOUNTER — Telehealth: Payer: Self-pay | Admitting: Internal Medicine

## 2019-05-13 NOTE — Progress Notes (Signed)
[facility] Consultation      Assessment and Plan:  Obstructive sleep apnea. -Known obstructive sleep apnea, diagnosed 2015. Was being treated with a dental device, has been developing progressive jaw pain and "jaw slipping", having difficulty using the device.   - Sent for split-night which showed AHI 24, started on auto CPAP with pressure range 5-15 cmH2O.  Has been doing well with that pressure, download shows residual AHI of less than 5 with good compliance.  Return in about 1 year (around 05/13/2020).   Date: 05/13/2019  MRN# [id number] STAMATIA MASRI Oct 13, 1940    Alison Huffman is a 78 y.o. old female seen in consultation for chief complaint of:    Chief Complaint  Patient presents with  . Follow-up    pt wearing cpap avg 7-8hr nightly- feels pressure & mask are okay.     HPI:   The patient is a 78 year old female (though looks considerably younger) with a history of obstructive sleep apnea, was using a dental appliance since 2014 and had been doing well with it, but more recently was starting to feel more tired during the day, and started developing jaw pain and jaw slipping.  She has started CPAP and feels that she is doing well with it. She uses a chin strap. She is more awake during the day and no longer snoring. She is cleaning her supplies once per week.   She has been started on CPAP, she is using nasal pillows and feels that she is doing well with that. She had some initial bloating but is better now.  She cleans her supplies daily with water and weekly with soap.   She was started on methotrexate for psoriatic arthritis.    **CPAP done on 04/13/2019-05/12/2019>> raw data personally reviewed.  Usage greater than 4 hours is 29/30 days.  Average usage on days used is 7 hours 25 minutes pressure is auto 5-15.  Median pressure 8, 95th percentile pressure 11, maximum pressure 12 leaks are slightly elevated, residual AHI is 2.  Overall this shows very  good compliance with CPAP with excellent control of obstructive sleep apnea. **Review of download data 03/21/2018-04/27/2018>> data personally reviewed.  This is greater than 4 hours is 25/30 days.  Average usage on days used is 7 hours 32 minutes.  Pressure ranges 5-15.  Median pressure 7, 95th percentile pressure 10, maximum pressure 11.  Residual AHI is 2.9.  Overall this shows good compliance with CPAP with excellent control of obstructive sleep apnea. **Sleep study 03/15/18>> AHI 24.(split night with titration) recommended 5-15 cm H2O.   Medication:    Current Outpatient Medications:  .  acetaminophen (TYLENOL) 325 MG tablet, Take 325-650 mg by mouth every 6 (six) hours as needed for mild pain or headache. , Disp: , Rfl:  .  calcium carbonate (TUMS EX) 750 MG chewable tablet, Chew 2 tablets by mouth as needed for heartburn., Disp: , Rfl:  .  Cholecalciferol (VITAMIN D3) 50 MCG (2000 UT) TABS, Take 2,000 Units by mouth daily., Disp: , Rfl:  .  Clobetasol Prop Emollient Base (CLOBETASOL PROPIONATE E) 0.05 % emollient cream, Apply 1 application topically every Sunday. , Disp: , Rfl:  .  desonide (DESOWEN) 0.05 % ointment, Apply 1 application topically 2 (two) times daily as needed (for psoriasis). , Disp: , Rfl:  .  guaiFENesin (MUCINEX) 600 MG 12 hr tablet, Take 1 tablet (600 mg total) by mouth 2 (two) times daily. (Patient not taking: Reported on 01/15/2019), Disp:  20 tablet, Rfl: 1 .  HUMIRA PEN 40 MG/0.4ML PNKT, Inject 0.4 mLs into the skin every 14 (fourteen) days., Disp: , Rfl:  .  metroNIDAZOLE (METROCREAM) 0.75 % cream, Apply topically 2 (two) times daily as needed (facial redness/irritation.). , Disp: , Rfl:  .  nystatin cream (MYCOSTATIN), Apply 1 application topically 2 (two) times daily as needed for dry skin. , Disp: , Rfl:  .  pantoprazole (PROTONIX) 40 MG tablet, Take 1 tablet (40 mg total) by mouth daily. (Patient taking differently: Take 40 mg by mouth daily before breakfast. 30  minutes prior to breakfast), Disp: 90 tablet, Rfl: 3 .  rosuvastatin (CRESTOR) 5 MG tablet, Take 1 tablet (5 mg total) by mouth daily., Disp: 90 tablet, Rfl: 3 .  tacrolimus (PROTOPIC) 0.1 % ointment, Apply topically 2 (two) times daily as needed (for psoriasis). , Disp: , Rfl:  .  valsartan (DIOVAN) 80 MG tablet, Take 1 tablet (80 mg total) by mouth daily., Disp: 90 tablet, Rfl: 0   Allergies:  Patient has no known allergies.   Review of Systems:  Constitutional: Feels well. Cardiovascular: Denies chest pain, exertional chest pain.  Pulmonary: Denies hemoptysis, pleuritic chest pain.   The remainder of systems were reviewed and were found to be negative other than what is documented in the HPI.    Physical Examination:   VS: BP 122/74 (BP Location: Left Arm, Cuff Size: Normal)   Pulse 66   Temp (!) 97.3 F (36.3 C) (Temporal)   Ht 5\' 6"  (1.676 m)   Wt 172 lb 9.6 oz (78.3 kg)   SpO2 95%   BMI 27.86 kg/m   General Appearance: No distress  Neuro:without focal findings, mental status, speech normal, alert and oriented HEENT: PERRLA, EOM intact Pulmonary: No wheezing, No rales  CardiovascularNormal S1,S2.  No m/r/g.  Abdomen: Benign, Soft, non-tender, No masses Renal:  No costovertebral tenderness  GU:  No performed at this time. Endoc: No evident thyromegaly, no signs of acromegaly or Cushing features Skin:   warm, no rashes, no ecchymosis  Extremities: normal, no cyanosis, clubbing.    LABORATORY PANEL:   CBC No results for input(s): WBC, HGB, HCT, PLT in the last 168 hours. ------------------------------------------------------------------------------------------------------------------  Chemistries  No results for input(s): NA, K, CL, CO2, GLUCOSE, BUN, CREATININE, CALCIUM, MG, AST, ALT, ALKPHOS, BILITOT in the last 168 hours.  Invalid input(s): GFRCGP ------------------------------------------------------------------------------------------------------------------   Cardiac Enzymes No results for input(s): TROPONINI in the last 168 hours. ------------------------------------------------------------  RADIOLOGY:  No results found.     Thank  you for the consultation and for allowing [facility] to assist in the care of your patient. Our recommendations are noted above.  Please contact us if we can be of further service.   Marda Stalker, M.D., F.C.C.P.  Board Certified in Internal Medicine, Pulmonary Medicine, [place], and Sleep Medicine.  [facility] Office Number: [phone]   05/13/2019

## 2019-05-13 NOTE — Telephone Encounter (Signed)
Called patient for COVID-19 pre-screening for in office visit. ° °Have you recently traveled any where out of the local area in the last 2 weeks? No ° °Have you been in close contact with a person diagnosed with COVID-19 or someone awaiting results within the last 2 weeks? No ° °Do you currently have any of the following symptoms? If so, when did they start? °Cough     Diarrhea   Joint Pain °Fever      Muscle Pain   Red eyes °Shortness of breath   Abdominal pain  Vomiting °Loss of smell    Rash    Sore Throat °Headache    Weakness   Bruising or bleeding ° ° °Okay to proceed with visit 05/14/2019 °  ° ° °

## 2019-05-14 ENCOUNTER — Other Ambulatory Visit: Payer: Self-pay

## 2019-05-14 ENCOUNTER — Encounter: Payer: Self-pay | Admitting: Internal Medicine

## 2019-05-14 ENCOUNTER — Telehealth: Payer: Self-pay

## 2019-05-14 ENCOUNTER — Ambulatory Visit (INDEPENDENT_AMBULATORY_CARE_PROVIDER_SITE_OTHER): Payer: Medicare Other | Admitting: Internal Medicine

## 2019-05-14 VITALS — BP 122/74 | HR 66 | Temp 97.3°F | Ht 66.0 in | Wt 172.6 lb

## 2019-05-14 DIAGNOSIS — G4733 Obstructive sleep apnea (adult) (pediatric): Secondary | ICD-10-CM | POA: Diagnosis not present

## 2019-05-14 NOTE — Patient Instructions (Signed)
Continue using cpap every night.  

## 2019-05-14 NOTE — Telephone Encounter (Signed)
Copied from [place] [phone]. Topic: General - Call Back - No Documentation >> May 14, 2019 11:20 AM Erick Blinks wrote: Reason for CRM: Pt called inquiring about letter requested via mychart. Please advise

## 2019-05-15 NOTE — Telephone Encounter (Signed)
Pt called inquiring about letter requested via mychart. Please advise.  Karsin Pesta,cma

## 2019-05-15 NOTE — Telephone Encounter (Signed)
Letter completed and forwarded to [facility]. Please print and make available for pick up.

## 2019-05-16 ENCOUNTER — Telehealth: Payer: Self-pay | Admitting: Family Medicine

## 2019-05-16 NOTE — Telephone Encounter (Signed)
Patient called to see if the letter written by Dr. Caryl Bis was ready. Advised that it was.

## 2019-05-16 NOTE — Telephone Encounter (Signed)
Patient out front waiting>.

## 2019-05-16 NOTE — Telephone Encounter (Signed)
Called and left a message informing the patient the letter she requested is ready for pickup.  Wesly Whisenant,cma

## 2019-05-16 NOTE — Telephone Encounter (Signed)
Patient picked up letter.  Nina,cma

## 2019-06-01 ENCOUNTER — Other Ambulatory Visit: Payer: Self-pay | Admitting: Obstetrics and Gynecology

## 2019-06-02 ENCOUNTER — Telehealth: Payer: Self-pay

## 2019-06-02 ENCOUNTER — Ambulatory Visit
Admission: RE | Admit: 2019-06-02 | Discharge: 2019-06-02 | Disposition: A | Discharge status: Home / Self Care | Payer: Medicare Other | Source: Ambulatory Visit | Attending: Family Medicine | Admitting: Family Medicine

## 2019-06-02 DIAGNOSIS — Z1231 Encounter for screening mammogram for malignant neoplasm of breast: Secondary | ICD-10-CM | POA: Insufficient documentation

## 2019-06-02 NOTE — Telephone Encounter (Signed)
Called and spoke to the patient and clarified the patient's name and I informed her that I put the wrong information in the envelope and aske d if she would discharge/shred the information and she stated she would.  Alison Huffman,cma

## 2019-06-02 NOTE — Telephone Encounter (Signed)
Received a call from [facility] stating that patient was on the line stating that she received the wrong lab results for another person.  [place] operator said that patient hung phone before she got the name of who the results was for.  Please advise patient.

## 2019-06-18 ENCOUNTER — Other Ambulatory Visit: Payer: Self-pay

## 2019-06-18 ENCOUNTER — Ambulatory Visit (INDEPENDENT_AMBULATORY_CARE_PROVIDER_SITE_OTHER): Payer: Medicare Other

## 2019-06-18 DIAGNOSIS — Z23 Encounter for immunization: Secondary | ICD-10-CM

## 2019-07-02 ENCOUNTER — Ambulatory Visit
Admission: RE | Admit: 2019-07-02 | Discharge: 2019-07-02 | Disposition: A | Discharge status: Home / Self Care | Payer: Medicare Other | Source: Ambulatory Visit | Attending: Obstetrics and Gynecology | Admitting: Obstetrics and Gynecology

## 2019-07-02 ENCOUNTER — Other Ambulatory Visit: Payer: Self-pay

## 2019-07-02 DIAGNOSIS — R9389 Abnormal findings on diagnostic imaging of other specified body structures: Secondary | ICD-10-CM

## 2019-07-02 DIAGNOSIS — N950 Postmenopausal bleeding: Secondary | ICD-10-CM | POA: Diagnosis not present

## 2019-07-02 DIAGNOSIS — N83202 Unspecified ovarian cyst, left side: Secondary | ICD-10-CM | POA: Diagnosis not present

## 2019-07-03 ENCOUNTER — Encounter: Payer: Self-pay | Admitting: Obstetrics and Gynecology

## 2019-07-03 ENCOUNTER — Ambulatory Visit (INDEPENDENT_AMBULATORY_CARE_PROVIDER_SITE_OTHER): Payer: Medicare Other | Admitting: Obstetrics and Gynecology

## 2019-07-03 VITALS — BP 126/84 | Ht 66.0 in | Wt 171.0 lb

## 2019-07-03 DIAGNOSIS — N83202 Unspecified ovarian cyst, left side: Secondary | ICD-10-CM

## 2019-07-03 NOTE — Progress Notes (Signed)
Reviewed with patient at visit on 07/03/2019- largely unchanged measurements from previous US in March

## 2019-07-07 ENCOUNTER — Other Ambulatory Visit
Admission: RE | Admit: 2019-07-07 | Discharge: 2019-07-07 | Disposition: A | Discharge status: Home / Self Care | Payer: Medicare Other | Source: Ambulatory Visit | Attending: Gastroenterology | Admitting: Gastroenterology

## 2019-07-07 DIAGNOSIS — Z01818 Encounter for other preprocedural examination: Secondary | ICD-10-CM | POA: Diagnosis not present

## 2019-07-07 DIAGNOSIS — Z20828 Contact with and (suspected) exposure to other viral communicable diseases: Secondary | ICD-10-CM | POA: Diagnosis not present

## 2019-07-07 LAB — SARS CORONAVIRUS 2 (TAT 6-24 HRS): SARS Coronavirus 2: NEGATIVE

## 2019-07-09 ENCOUNTER — Encounter: Payer: Self-pay | Admitting: *Deleted

## 2019-07-10 ENCOUNTER — Encounter
Admission: RE | Disposition: A | Discharge status: Home / Self Care | Payer: Self-pay | Source: Home / Self Care | Attending: Gastroenterology

## 2019-07-10 ENCOUNTER — Ambulatory Visit: Payer: Medicare Other | Admitting: Certified Registered"

## 2019-07-10 ENCOUNTER — Ambulatory Visit
Admission: RE | Admit: 2019-07-10 | Discharge: 2019-07-10 | Disposition: A | Discharge status: Home / Self Care | Payer: Medicare Other | Attending: Gastroenterology | Admitting: Gastroenterology

## 2019-07-10 ENCOUNTER — Other Ambulatory Visit: Payer: Self-pay

## 2019-07-10 DIAGNOSIS — K5790 Diverticulosis of intestine, part unspecified, without perforation or abscess without bleeding: Secondary | ICD-10-CM | POA: Diagnosis not present

## 2019-07-10 DIAGNOSIS — K648 Other hemorrhoids: Secondary | ICD-10-CM | POA: Diagnosis not present

## 2019-07-10 DIAGNOSIS — Z950 Presence of cardiac pacemaker: Secondary | ICD-10-CM | POA: Insufficient documentation

## 2019-07-10 DIAGNOSIS — Z87891 Personal history of nicotine dependence: Secondary | ICD-10-CM | POA: Diagnosis not present

## 2019-07-10 DIAGNOSIS — K2970 Gastritis, unspecified, without bleeding: Secondary | ICD-10-CM | POA: Insufficient documentation

## 2019-07-10 DIAGNOSIS — K2100 Gastro-esophageal reflux disease with esophagitis, without bleeding: Secondary | ICD-10-CM | POA: Diagnosis not present

## 2019-07-10 DIAGNOSIS — E785 Hyperlipidemia, unspecified: Secondary | ICD-10-CM | POA: Insufficient documentation

## 2019-07-10 DIAGNOSIS — K644 Residual hemorrhoidal skin tags: Secondary | ICD-10-CM | POA: Insufficient documentation

## 2019-07-10 DIAGNOSIS — G4730 Sleep apnea, unspecified: Secondary | ICD-10-CM | POA: Diagnosis not present

## 2019-07-10 DIAGNOSIS — I10 Essential (primary) hypertension: Secondary | ICD-10-CM | POA: Diagnosis not present

## 2019-07-10 DIAGNOSIS — R194 Change in bowel habit: Secondary | ICD-10-CM | POA: Diagnosis not present

## 2019-07-10 DIAGNOSIS — L4050 Arthropathic psoriasis, unspecified: Secondary | ICD-10-CM | POA: Diagnosis not present

## 2019-07-10 DIAGNOSIS — K640 First degree hemorrhoids: Secondary | ICD-10-CM | POA: Insufficient documentation

## 2019-07-10 DIAGNOSIS — R1013 Epigastric pain: Secondary | ICD-10-CM | POA: Insufficient documentation

## 2019-07-10 DIAGNOSIS — K5730 Diverticulosis of large intestine without perforation or abscess without bleeding: Secondary | ICD-10-CM | POA: Diagnosis not present

## 2019-07-10 DIAGNOSIS — I2510 Atherosclerotic heart disease of native coronary artery without angina pectoris: Secondary | ICD-10-CM | POA: Diagnosis not present

## 2019-07-10 DIAGNOSIS — G4733 Obstructive sleep apnea (adult) (pediatric): Secondary | ICD-10-CM | POA: Diagnosis not present

## 2019-07-10 DIAGNOSIS — Z79899 Other long term (current) drug therapy: Secondary | ICD-10-CM | POA: Insufficient documentation

## 2019-07-10 DIAGNOSIS — Q438 Other specified congenital malformations of intestine: Secondary | ICD-10-CM | POA: Diagnosis not present

## 2019-07-10 DIAGNOSIS — K449 Diaphragmatic hernia without obstruction or gangrene: Secondary | ICD-10-CM | POA: Insufficient documentation

## 2019-07-10 DIAGNOSIS — K5904 Chronic idiopathic constipation: Secondary | ICD-10-CM | POA: Insufficient documentation

## 2019-07-10 DIAGNOSIS — K219 Gastro-esophageal reflux disease without esophagitis: Secondary | ICD-10-CM | POA: Diagnosis not present

## 2019-07-10 DIAGNOSIS — R079 Chest pain, unspecified: Secondary | ICD-10-CM | POA: Insufficient documentation

## 2019-07-10 DIAGNOSIS — R142 Eructation: Secondary | ICD-10-CM | POA: Diagnosis not present

## 2019-07-10 DIAGNOSIS — K5900 Constipation, unspecified: Secondary | ICD-10-CM | POA: Diagnosis not present

## 2019-07-10 HISTORY — PX: COLONOSCOPY WITH PROPOFOL: SHX5780

## 2019-07-10 HISTORY — DX: Atherosclerotic heart disease of native coronary artery without angina pectoris: I25.10

## 2019-07-10 HISTORY — DX: Pain in right ankle and joints of right foot: M25.571

## 2019-07-10 HISTORY — DX: Pain in joints of right hand: M25.541

## 2019-07-10 HISTORY — DX: Dislocation of jaw, unspecified side, initial encounter: S03.00XA

## 2019-07-10 HISTORY — PX: ESOPHAGOGASTRODUODENOSCOPY (EGD) WITH PROPOFOL: SHX5813

## 2019-07-10 LAB — HM COLONOSCOPY

## 2019-07-10 SURGERY — ESOPHAGOGASTRODUODENOSCOPY (EGD) WITH PROPOFOL
Anesthesia: General

## 2019-07-10 MED ORDER — PROPOFOL 10 MG/ML IV BOLUS
INTRAVENOUS | Status: AC
  Filled 2019-07-10: qty 20

## 2019-07-10 MED ORDER — LIDOCAINE HCL (CARDIAC) PF 100 MG/5ML IV SOSY
PREFILLED_SYRINGE | INTRAVENOUS | Status: DC | PRN
  Administered 2019-07-10: 50 mg via INTRAVENOUS

## 2019-07-10 MED ORDER — PROPOFOL 500 MG/50ML IV EMUL
INTRAVENOUS | Status: DC | PRN
  Administered 2019-07-10: 185 ug/kg/min via INTRAVENOUS

## 2019-07-10 MED ORDER — PROPOFOL 10 MG/ML IV BOLUS
INTRAVENOUS | Status: DC | PRN
  Administered 2019-07-10: 50 mg via INTRAVENOUS

## 2019-07-10 MED ORDER — PHENYLEPHRINE HCL (PRESSORS) 10 MG/ML IV SOLN
INTRAVENOUS | Status: DC | PRN
  Administered 2019-07-10: 100 ug via INTRAVENOUS

## 2019-07-10 MED ORDER — SODIUM CHLORIDE 0.9 % IV SOLN
INTRAVENOUS | Status: DC
  Administered 2019-07-10: 07:00:00 via INTRAVENOUS

## 2019-07-10 MED ORDER — EPHEDRINE SULFATE 50 MG/ML IJ SOLN
INTRAMUSCULAR | Status: DC | PRN
  Administered 2019-07-10: 5 mg via INTRAVENOUS

## 2019-07-10 MED ORDER — PROPOFOL 500 MG/50ML IV EMUL
INTRAVENOUS | Status: AC
  Filled 2019-07-10: qty 50

## 2019-07-10 NOTE — Op Note (Signed)
[facility] Patient Name: Alison Huffman Procedure Date: 07/10/2019 7:55 AM MRN: [id number] Account #: [id number] Date of Birth: October 16, 1940 Admit Type: Outpatient Age: 78 Room: [facility] ENDO ROOM 1 Gender: Female Note Status: Finalized Procedure:            Upper GI endoscopy Indications:          Dyspepsia, Gastro-esophageal reflux disease,                        Unexplained chest pain Providers:            Lollie Sails, MD Referring MD:         Angela Adam. Caryl Bis (Referring MD) Medicines:            Monitored Anesthesia Care Complications:        No immediate complications. Procedure:            Pre-Anesthesia Assessment:                       - ASA Grade Assessment: III - A patient with severe                        systemic disease.                       After obtaining informed consent, the endoscope was                        passed under direct vision. Throughout the procedure,                        the patient's blood pressure, pulse, and oxygen                        saturations were monitored continuously. The Endoscope                        was introduced through the mouth, and advanced to the                        third part of duodenum. The upper GI endoscopy was                        accomplished without difficulty. The patient tolerated                        the procedure well. Findings:      The Z-line was variable. Biopsies were taken with a cold forceps for       histology.      A small hiatal hernia was found. The Z-line was a variable distance from       incisors; the hiatal hernia was sliding.      Patchy mild inflammation characterized by congestion (edema) and       erythema was found in the gastric body and in the gastric antrum.       Biopsies were taken with a cold forceps for histology.      The cardia and gastric fundus were normal on retroflexion otherwise.      The examined duodenum was normal. Impression:            - Z-line variable. Biopsied.                       -  Small hiatal hernia.                       - Gastritis. Biopsied.                       - Normal examined duodenum. Recommendation:       - Perform a colonoscopy today.                       - Use Pepcid (famotidine) 20 mg PO BID daily.                       - Use sucralfate tablets 1 gram PO BID.                       - Return to GI clinic in 4 weeks. Procedure Code(s):    --- Professional ---                       [phone], Esophagogastroduodenoscopy, flexible, transoral;                        with biopsy, single or multiple Diagnosis Code(s):    --- Professional ---                       K22.8, Other specified diseases of esophagus                       K44.9, Diaphragmatic hernia without obstruction or                        gangrene                       K29.70, Gastritis, unspecified, without bleeding                       R10.13, Epigastric pain                       K21.9, Gastro-esophageal reflux disease without                        esophagitis                       R07.9, Chest pain, unspecified CPT copyright 2019 American Medical Association. All rights reserved. The codes documented in this report are preliminary and upon coder review may  be revised to meet current compliance requirements. Lollie Sails, MD 07/10/2019 8:18:18 AM This report has been signed electronically. Number of Addenda: 0 Note Initiated On: 07/10/2019 7:55 AM      [facility]

## 2019-07-10 NOTE — Anesthesia Preprocedure Evaluation (Signed)
Anesthesia Evaluation  Patient identified by MRN, date of birth, ID band Patient awake    Reviewed: Allergy & Precautions, H&P , NPO status , Patient's Chart, lab work & pertinent test results, reviewed documented beta blocker date and time   Airway Mallampati: II   Neck ROM: full    Dental  (+) Poor Dentition   Pulmonary sleep apnea and Continuous Positive Airway Pressure Ventilation , pneumonia, resolved, former smoker,    Pulmonary exam normal        Cardiovascular Exercise Tolerance: Good hypertension, On Medications + CAD  Normal cardiovascular exam+ dysrhythmias + pacemaker  Rhythm:regular Rate:Normal     Neuro/Psych  Headaches, negative psych ROS   GI/Hepatic Neg liver ROS, GERD  Medicated,  Endo/Other  negative endocrine ROS  Renal/GU negative Renal ROS  negative genitourinary   Musculoskeletal   Abdominal   Peds  Hematology negative hematology ROS (+)   Anesthesia Other Findings Past Medical History: No date: Ankle pain, right No date: Atypical chest pain     Comment:  a. 10/2018 MV: EF 57%. No ischemia/infarct. No date: Coronary artery disease No date: Diastolic dysfunction     Comment:  a. 04/2018 Echo: EF 55-60%, no rwma, Gr1 DD. Triv AI,               mild MR. Mod dil LA. Nl RV fxn. PASP nl. 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 X 1: Pneumonia 07/23/2018: Presence of permanent cardiac pacemaker No date: Psoriasis No date: Psoriatic arthritis ([place])     Comment:  " dx'd the 1st of this year, 2019" (07/22/2018) No date: Psoriatic arthritis ([place]) No date: Rosacea No date: Seborrheic  keratosis No date: Second degree heart block     Comment:  a. 07/2018 s/p SJM Assurity MRI model PM2271 (Ser#               [id number]). No date: Sleep apnea     Comment:  "using nasal pilllows since ~ 12/2017" (07/22/2018) No date: TMJ (dislocation of temporomandibular joint) Past Surgical History: No date: APPENDECTOMY 1978: BREAST BIOPSY; Left     Comment:  "benign" No date: BREAST CYST ASPIRATION; Bilateral No date: COLONOSCOPY 11/05/2018: HYSTEROSCOPY W/D&C; N/A     Comment:  Procedure: DILATATION AND CURETTAGE /HYSTEROSCOPY;                Surgeon: Homero Fellers, MD;  Location: [facility] ORS;               Service: Gynecology;  Laterality: N/A; No date: INSERT / REPLACE / REMOVE PACEMAKER 07/23/2018: PACEMAKER IMPLANT; N/A     Comment:  SJM Assurity [id number] implanted by 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 No date: UTERINE POLYPS REMOVAL BMI    Body Mass Index: 27.28 kg/m     Reproductive/Obstetrics negative OB ROS                             Anesthesia Physical Anesthesia Plan  ASA: III  Anesthesia Plan:  General   Post-op Pain Management:    Induction:   PONV Risk Score and Plan:   Airway Management Planned:   Additional Equipment:   Intra-op Plan:   Post-operative Plan:   Informed Consent: I have reviewed the patients History and Physical, chart, labs and discussed the procedure including the risks, benefits and alternatives for the proposed anesthesia with the patient or authorized representative who has indicated his/her understanding and acceptance.     Dental Advisory Given  Plan Discussed with: CRNA  Anesthesia Plan Comments:         Anesthesia Quick Evaluation

## 2019-07-10 NOTE — H&P (Signed)
Outpatient short stay form Pre-procedure 07/10/2019 7:54 AM Alison Sails MD  Primary Physician: Dr. Tommi Rumps  Reason for visit: EGD and colonoscopy  History of present illness: Patient is a 78 year old female presenting today for an EGD and colonoscopy in regards to atypical reflux symptoms and change in bowel habits with increasing constipation.  Patient had a barium swallow with her primary care physician on 10/01/2018 that showing a small hiatal hernia with some moderate gastroesophageal reflux.  The 13 mm tablet transited without delay.  Patient is found that she has to take prune juice or apple juice help her go for bowel movement.  Has bowel movement anywhere from every day to every third day.  She has previously been on medications for reflux never stopped all of those because of her concern for her history of osteopenia.  Patient does take Humira for her history of psoriatic arthritis.  She tolerated her prep well.  She denies use of any blood thinner or aspirin product.    Current Facility-Administered Medications:  .  0.9 %  sodium chloride infusion, , Intravenous, Continuous, Alison Sails, MD, Last Rate: 20 mL/hr at 07/10/19 0719  Medications Prior to Admission  Medication Sig Dispense Refill Last Dose  . acetaminophen (TYLENOL) 325 MG tablet Take 325-650 mg by mouth every 6 (six) hours as needed for mild pain or headache.    Past Week at Unknown time  . calcium carbonate (TUMS EX) 750 MG chewable tablet Chew 2 tablets by mouth as needed for heartburn.   Past Week at Unknown time  . Cholecalciferol (VITAMIN D3) 50 MCG (2000 UT) TABS Take 2,000 Units by mouth daily.   Past Week at Unknown time  . Clobetasol Prop Emollient Base (CLOBETASOL PROPIONATE E) 0.05 % emollient cream Apply 1 application topically every Sunday.    Past Week at Unknown time  . desonide (DESOWEN) 0.05 % ointment Apply 1 application topically 2 (two) times daily as needed (for psoriasis).    Past  Week at Unknown time  . guaiFENesin (MUCINEX) 600 MG 12 hr tablet Take 1 tablet (600 mg total) by mouth 2 (two) times daily. 20 tablet 1 Past Month at Unknown time  . HUMIRA PEN 40 MG/0.4ML PNKT Inject 0.4 mLs into the skin every 14 (fourteen) days.   Past Week at Unknown time  . lidocaine (LIDODERM) 5 % Place 1 patch onto the skin daily. Remove & Discard patch within 12 hours or as directed by MD   Past Week at Unknown time  . metroNIDAZOLE (METROCREAM) 0.75 % cream Apply topically 2 (two) times daily as needed (facial redness/irritation.).    Past Week at Unknown time  . nystatin cream (MYCOSTATIN) Apply 1 application topically 2 (two) times daily as needed for dry skin.    Past Week at Unknown time  . pantoprazole (PROTONIX) 40 MG tablet Take 1 tablet (40 mg total) by mouth daily. (Patient taking differently: Take 20 mg by mouth daily before breakfast. 30 minutes prior to breakfast) 90 tablet 3 Past Week at Unknown time  . rosuvastatin (CRESTOR) 5 MG tablet Take 1 tablet (5 mg total) by mouth daily. 90 tablet 3 Past Week at Unknown time  . tacrolimus (PROTOPIC) 0.1 % ointment Apply topically 2 (two) times daily as needed (for psoriasis).    Past Week at Unknown time  . valsartan (DIOVAN) 80 MG tablet Take 1 tablet (80 mg total) by mouth daily. 90 tablet 0 07/09/2019 at Unknown time     No  Known Allergies   Past Medical History:  Diagnosis Date  . Ankle pain, right   . Atypical chest pain    a. 10/2018 MV: EF 57%. No ischemia/infarct.  . Coronary artery disease   . Diastolic dysfunction    a. 04/2018 Echo: EF 55-60%, no rwma, Gr1 DD. Triv AI, mild MR. Mod dil LA. Nl RV fxn. PASP nl.  . GERD (gastroesophageal reflux disease)   . Headache    "dull one sometimes daily, at least weekly in last couple months" (07/22/2018)  . Hyperlipidemia   . Hypertension   . Inflammatory neuropathy ([place])   . Joint pain in fingers of right hand   . Lichen sclerosus   . Osteopenia   . Pneumonia 2012? X 1   . 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 Psoriasis   . Psoriatic arthritis ([place])    " dx'd the 1st of this year, 2019" (07/22/2018)  . Psoriatic arthritis ([place])   . Rosacea   . Seborrheic keratosis   . Second degree heart block    a. 07/2018 s/p SJM Assurity MRI model PM2271 (Ser# [id number]).  . Sleep apnea    "using nasal pilllows since ~ 12/2017" (07/22/2018)  . TMJ (dislocation of temporomandibular joint)     Review of systems:      Physical Exam    Heart and lungs: Regular rate and rhythm without rub or gallop lungs are bilaterally clear    HEENT: Normocephalic atraumatic eyes are anicteric    Other:    Pertinant exam for procedure: Soft nontender nondistended bowel sounds positive normoactive    Planned proceedures: EGD, colonoscopy and indicated procedures. I have discussed the risks benefits and complications of procedures to include not limited to bleeding, infection, perforation and the risk of sedation and the patient wishes to proceed.    Alison Sails, MD Gastroenterology 07/10/2019  7:54 AM

## 2019-07-10 NOTE — Anesthesia Post-op Follow-up Note (Signed)
Anesthesia QCDR form completed.        

## 2019-07-10 NOTE — Transfer of Care (Signed)
Immediate Anesthesia Transfer of Care Note  Patient: Alison Huffman  Procedure(s) Performed: ESOPHAGOGASTRODUODENOSCOPY (EGD) WITH PROPOFOL (N/A ) COLONOSCOPY WITH PROPOFOL (N/A )  Patient Location: PACU  Anesthesia Type:General  Level of Consciousness: awake, alert  and oriented  Airway & Oxygen Therapy: Patient Spontanous Breathing and Patient connected to nasal cannula oxygen  Post-op Assessment: Report given to RN and Post -op Vital signs reviewed and stable  Post vital signs: Reviewed and stable  Last Vitals:  Vitals Value Taken Time  BP    Temp    Pulse    Resp    SpO2      Last Pain:  Vitals:   07/10/19 0658  TempSrc: Tympanic  PainSc: 0-No pain         Complications: No apparent anesthesia complications

## 2019-07-10 NOTE — Op Note (Signed)
[facility] Gastroenterology Patient Name: Alison Huffman Procedure Date: 07/10/2019 7:54 AM MRN: [id number] Account #: [id number] Date of Birth: Mar 28, 1941 Admit Type: Outpatient Age: 78 Room: [facility] ENDO ROOM 1 Gender: Female Note Status: Finalized Procedure:            Colonoscopy Indications:          Change in bowel habits, Chronic idiopathic constipation Providers:            Lollie Sails, MD Referring MD:         Angela Adam. Caryl Bis (Referring MD) Medicines:            Monitored Anesthesia Care Complications:        No immediate complications. Procedure:            Pre-Anesthesia Assessment:                       - ASA Grade Assessment: III - A patient with severe                        systemic disease.                       After obtaining informed consent, the colonoscope was                        passed under direct vision. Throughout the procedure,                        the patient's blood pressure, pulse, and oxygen                        saturations were monitored continuously. The                        Colonoscope was introduced through the anus and                        advanced to the the cecum, identified by appendiceal                        orifice and ileocecal valve. The colonoscopy was                        performed with moderate difficulty due to a redundant                        colon and a tortuous colon. Successful completion of                        the procedure was aided by using manual pressure. The                        quality of the bowel preparation was fair. The patient                        tolerated the procedure well. Findings:      Multiple medium-mouthed diverticula were found in the sigmoid colon and       descending colon.      The sigmoid colon and transverse colon were significantly redundant.  Non-bleeding external and internal hemorrhoids were found during       retroflexion and during anoscopy. The  hemorrhoids were small and Grade I       (internal hemorrhoids that do not prolapse). Impression:           - Preparation of the colon was fair.                       - Diverticulosis in the sigmoid colon and in the                        descending colon.                       - Redundant colon.                       - Non-bleeding external and internal hemorrhoids.                       - No specimens collected. Recommendation:       - consider linzess if low dose fiber not helpful                       - Use Citrucel one tablespoon PO daily daily. Procedure Code(s):    --- Professional ---                       [phone], Colonoscopy, flexible; diagnostic, including                        collection of specimen(s) by brushing or washing, when                        performed (separate procedure) Diagnosis Code(s):    --- Professional ---                       K64.0, First degree hemorrhoids                       R19.4, Change in bowel habit                       K59.04, Chronic idiopathic constipation                       K57.30, Diverticulosis of large intestine without                        perforation or abscess without bleeding                       Q43.8, Other specified congenital malformations of                        intestine CPT copyright 2019 [employer]. All rights reserved. The codes documented in this report are preliminary and upon coder review may  be revised to meet current compliance requirements. Lollie Sails, MD 07/10/2019 9:00:33 AM This report has been signed electronically. Number of Addenda: 0 Note Initiated On: 07/10/2019 7:54 AM Scope Withdrawal Time: 0 hours 5 minutes 39 seconds  Total Procedure Duration: 0 hours 25 minutes 35 seconds  [facility]

## 2019-07-11 ENCOUNTER — Encounter: Payer: Self-pay | Admitting: Gastroenterology

## 2019-07-11 LAB — SURGICAL PATHOLOGY

## 2019-07-11 NOTE — Anesthesia Postprocedure Evaluation (Signed)
Anesthesia Post Note  Patient: [name]  Procedure(s) Performed: ESOPHAGOGASTRODUODENOSCOPY (EGD) WITH PROPOFOL (N/A ) COLONOSCOPY WITH PROPOFOL (N/A )  Patient location during evaluation: PACU Anesthesia Type: General Level of consciousness: awake and alert Pain management: pain level controlled Vital Signs Assessment: post-procedure vital signs reviewed and stable Respiratory status: spontaneous breathing, nonlabored ventilation, respiratory function stable and patient connected to nasal cannula oxygen Cardiovascular status: blood pressure returned to baseline and stable Postop Assessment: no apparent nausea or vomiting Anesthetic complications: no     Last Vitals:  Vitals:   07/10/19 0910 07/10/19 0920  BP: (!) 113/59 (!) 134/51  Pulse: 74 73  Resp: (!) 21 18  Temp:    SpO2: 100% 100%    Last Pain:  Vitals:   07/10/19 0850  TempSrc: Tympanic  PainSc:                  Molli Barrows

## 2019-07-16 ENCOUNTER — Ambulatory Visit (INDEPENDENT_AMBULATORY_CARE_PROVIDER_SITE_OTHER): Payer: Medicare Other | Admitting: *Deleted

## 2019-07-16 DIAGNOSIS — I441 Atrioventricular block, second degree: Secondary | ICD-10-CM

## 2019-07-16 LAB — CUP PACEART REMOTE DEVICE CHECK
Battery Remaining Longevity: 130 mo
Battery Remaining Percentage: 95.5 %
Battery Voltage: 3.01 V
Brady Statistic AP VP Percent: 1 %
Brady Statistic AP VS Percent: 9.7 %
Brady Statistic AS VP Percent: 2.5 %
Brady Statistic AS VS Percent: 85 %
Brady Statistic RA Percent Paced: 6.7 %
Brady Statistic RV Percent Paced: 2.8 %
Date Time Interrogation Session: 20201014060018
Implantable Lead Implant Date: 20191022
Implantable Lead Implant Date: 20191022
Implantable Lead Location: 753859
Implantable Lead Location: 753860
Implantable Pulse Generator Implant Date: 20191022
Lead Channel Impedance Value: 680 Ohm
Lead Channel Impedance Value: 730 Ohm
Lead Channel Pacing Threshold Amplitude: 0.5 V
Lead Channel Pacing Threshold Amplitude: 0.75 V
Lead Channel Pacing Threshold Pulse Width: 0.5 ms
Lead Channel Pacing Threshold Pulse Width: 0.5 ms
Lead Channel Sensing Intrinsic Amplitude: 12 mV
Lead Channel Sensing Intrinsic Amplitude: 5 mV
Lead Channel Setting Pacing Amplitude: 1 V
Lead Channel Setting Pacing Amplitude: 2 V
Lead Channel Setting Pacing Pulse Width: 0.5 ms
Lead Channel Setting Sensing Sensitivity: 2 mV
Pulse Gen Model: 2272
Pulse Gen Serial Number: 9073848

## 2019-07-28 NOTE — Progress Notes (Signed)
Remote pacemaker transmission.   

## 2019-08-07 DIAGNOSIS — K5909 Other constipation: Secondary | ICD-10-CM | POA: Diagnosis not present

## 2019-08-07 DIAGNOSIS — K449 Diaphragmatic hernia without obstruction or gangrene: Secondary | ICD-10-CM | POA: Diagnosis not present

## 2019-08-07 DIAGNOSIS — K2970 Gastritis, unspecified, without bleeding: Secondary | ICD-10-CM | POA: Diagnosis not present

## 2019-08-07 DIAGNOSIS — K219 Gastro-esophageal reflux disease without esophagitis: Secondary | ICD-10-CM | POA: Diagnosis not present

## 2019-08-08 DIAGNOSIS — H2513 Age-related nuclear cataract, bilateral: Secondary | ICD-10-CM | POA: Diagnosis not present

## 2019-08-18 ENCOUNTER — Other Ambulatory Visit: Payer: Self-pay | Admitting: Obstetrics and Gynecology

## 2019-08-18 DIAGNOSIS — N83202 Unspecified ovarian cyst, left side: Secondary | ICD-10-CM

## 2019-08-19 ENCOUNTER — Other Ambulatory Visit: Payer: Medicare Other

## 2019-08-19 ENCOUNTER — Other Ambulatory Visit: Payer: Self-pay

## 2019-08-19 DIAGNOSIS — N83202 Unspecified ovarian cyst, left side: Secondary | ICD-10-CM | POA: Diagnosis not present

## 2019-08-20 LAB — POSTMENOPAUSAL INTERP: LOW

## 2019-08-20 LAB — OVARIAN MALIGNANCY RISK-ROMA
Cancer Antigen (CA) 125: 5.1 U/mL (ref 0.0–38.1)
HE4: 82.4 pmol/L (ref 0.0–96.9)
Postmenopausal ROMA: 0.9
Premenopausal ROMA: 1.98 — ABNORMAL HIGH

## 2019-08-20 LAB — PREMENOPAUSAL INTERP: HIGH

## 2019-08-27 ENCOUNTER — Other Ambulatory Visit: Payer: Self-pay | Admitting: Obstetrics and Gynecology

## 2019-08-27 DIAGNOSIS — N83202 Unspecified ovarian cyst, left side: Secondary | ICD-10-CM

## 2019-08-27 NOTE — Progress Notes (Signed)
I have been following this patient who has a complex left ovarian cyst. Its remained stable for a year and no elevation in Ca125. I'm going to continue to monitor her and repeat the [place] in March since the cyst has not changed and she would like to avoid surgery if this is of benign origin. Let me know if you feel anything different should be done.

## 2019-08-27 NOTE — Progress Notes (Signed)
Called and discussed result with patient. Will plan to repeat US in March 2021.

## 2019-09-01 ENCOUNTER — Telehealth: Payer: Self-pay | Admitting: Obstetrics and Gynecology

## 2019-09-01 ENCOUNTER — Other Ambulatory Visit: Payer: Self-pay | Admitting: Obstetrics and Gynecology

## 2019-09-01 DIAGNOSIS — N83202 Unspecified ovarian cyst, left side: Secondary | ICD-10-CM

## 2019-09-01 NOTE — Telephone Encounter (Signed)
Patient is  Aware of location date and time

## 2019-09-01 NOTE — Telephone Encounter (Signed)
-----   Message from [place] Lodge sent at 08/27/2019  3:48 PM EST ----- Please schedule the patient's u/s and f/u. Thanks. ----- Message ----- From: Homero Fellers, MD Sent: 08/27/2019   3:15 PM EST To: Alexandria Lodge  In March 2021 this patient will need an US done at the hospital or [place] and then an office follow up with me after the [place].  Thank you,  Dr. Gilman Schmidt

## 2019-09-04 DIAGNOSIS — L409 Psoriasis, unspecified: Secondary | ICD-10-CM | POA: Diagnosis not present

## 2019-09-04 DIAGNOSIS — Z79899 Other long term (current) drug therapy: Secondary | ICD-10-CM | POA: Diagnosis not present

## 2019-09-04 DIAGNOSIS — L4050 Arthropathic psoriasis, unspecified: Secondary | ICD-10-CM | POA: Diagnosis not present

## 2019-09-19 ENCOUNTER — Ambulatory Visit (INDEPENDENT_AMBULATORY_CARE_PROVIDER_SITE_OTHER): Payer: Medicare Other

## 2019-09-19 ENCOUNTER — Other Ambulatory Visit: Payer: Self-pay

## 2019-09-19 VITALS — BP 122/61 | HR 67 | Ht 66.0 in | Wt 169.0 lb

## 2019-09-19 DIAGNOSIS — Z0000 Encounter for general adult medical examination without abnormal findings: Secondary | ICD-10-CM

## 2019-09-19 NOTE — Patient Instructions (Addendum)
  Alison Huffman , Thank you for taking time to come for your Medicare Wellness Visit. I appreciate your ongoing commitment to your health goals. Please review the following plan we discussed and let me know if I can assist you in the future.   These are the goals we discussed: Goals    . Weight 145lbs     Increase physical activity; add stairs to walking routine Low carb diet; portion control       This is a list of the screening recommended for you and due dates:  Health Maintenance  Topic Date Due  . Tetanus Vaccine  09/05/2027  . Flu Shot  Completed  . DEXA scan (bone density measurement)  Completed  . Pneumonia vaccines  Completed

## 2019-09-19 NOTE — Progress Notes (Signed)
Subjective:   Alison Huffman is a 78 y.o. female who presents for Medicare Annual (Subsequent) preventive examination.  Review of Systems:  No ROS.  Medicare Wellness Virtual Visit.  Visual/audio telehealth visit, vital signs provided by patient. See social history for additional risk factors.   Cardiac Risk Factors include: advanced age (>74men, >89 women);hypertension     Objective:     Vitals: BP 122/61 (BP Location: Left Arm, Patient Position: Sitting, Cuff Size: Normal)    Pulse 67    Ht 5\' 6"  (1.676 m)    Wt 169 lb (76.7 kg)    BMI 27.28 kg/m   Body mass index is 27.28 kg/m.  Advanced Directives 09/19/2019 07/10/2019 11/05/2018 10/14/2018 07/22/2018 07/22/2018 05/03/2018  Does Patient Have a Medical Advance Directive? Yes No Yes Yes Yes Yes Yes  Type of Advance Directive Living will;Healthcare Power of [place];Living will [place];Living will [place];Living will [place];Living will [place];Living will  Does patient want to make changes to medical advance directive? No - Patient declined - No - Patient declined - No - Patient declined - No - Patient declined  Copy of [place] in Chart? No - copy requested - No - copy requested No - copy requested No - copy requested - No - copy requested  Would patient like information on creating a medical advance directive? - No - Patient declined - - - - -    Tobacco Social History   Tobacco Use  Smoking Status Former Smoker   Types: Cigarettes  Smokeless Tobacco Never Used  Tobacco Comment   07/22/2018 "smoked some in college; 1960s;  nothing since"     Counseling given: Not Answered Comment: 07/22/2018 "smoked some in college; 1960s;  nothing since"   Clinical Intake:  Pre-visit preparation completed: Yes        Diabetes: No  How often do you need to have someone help you when you read  instructions, pamphlets, or other written materials from your doctor or pharmacy?: 1 - Never  Interpreter Needed?: No     Past Medical History:  Diagnosis Date   Ankle pain, right    Atypical chest pain    a. 10/2018 MV: EF 57%. No ischemia/infarct.   Coronary artery disease    Diastolic dysfunction    a. 04/2018 Echo: EF 55-60%, no rwma, Gr1 DD. Triv AI, mild MR. Mod dil LA. Nl RV fxn. PASP nl.   GERD (gastroesophageal reflux disease)    Headache    "dull one sometimes daily, at least weekly in last couple months" (07/22/2018)   Hyperlipidemia    Hypertension    Inflammatory neuropathy (HCC)    Joint pain in fingers of right hand    Lichen sclerosus    Osteopenia    Pneumonia 2012? 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 07/2018 s/p SJM Assurity MRI model PM2271 (Ser# [id number]).   Sleep apnea    "using nasal pilllows since ~ 12/2017" (07/22/2018)   TMJ (dislocation of temporomandibular joint)    Past Surgical History:  Procedure Laterality Date   APPENDECTOMY     BREAST BIOPSY Left 1978   "  benign"   BREAST CYST ASPIRATION Bilateral    COLONOSCOPY     COLONOSCOPY WITH PROPOFOL N/A 07/10/2019   Procedure: COLONOSCOPY WITH PROPOFOL;  Surgeon: Lollie Sails, MD;  Location: [facility] ENDOSCOPY;  Service: Endoscopy;  Laterality: N/A;   ESOPHAGOGASTRODUODENOSCOPY (EGD) WITH PROPOFOL N/A 07/10/2019   Procedure: ESOPHAGOGASTRODUODENOSCOPY (EGD) WITH PROPOFOL;  Surgeon: Lollie Sails, MD;  Location[facility] ENDOSCOPY;  Service: Endoscopy;  Laterality: N/A;   HYSTEROSCOPY WITH D & C N/A 11/05/2018   Procedure: DILATATION AND CURETTAGE /HYSTEROSCOPY;  Surgeon: Homero Fellers, MD;  Location: [facility] ORS;  Service: Gynecology;  Laterality: N/A;    INSERT / REPLACE / REMOVE PACEMAKER     PACEMAKER IMPLANT N/A 07/23/2018   SJM Assurity [id number] implanted by Dr Rayann Heman for mobitz II second degree AV block   UTERINE POLYPS REMOVAL     Family History  Problem Relation Age of Onset   Stroke Mother    Atrial fibrillation Mother    Breast cancer Mother    Stroke Father    Breast cancer Maternal Aunt    Breast cancer Paternal Aunt    Social History   Socioeconomic History   Marital status: Single    Spouse name: Not on file   Number of children: Not on file   Years of education: Not on file   Highest education level: Not on file  Occupational History   Not on file  Tobacco Use   Smoking status: Former Smoker    Types: Cigarettes   Smokeless tobacco: Never Used   Tobacco comment: 07/22/2018 "smoked some in college; 1960s;  nothing since"  Substance and Sexual Activity   Alcohol use: Yes    Comment: rarely   Drug use: Never   Sexual activity: Not Currently    Birth control/protection: Post-menopausal  Other Topics Concern   Not on file  Social History Narrative   Not on file   Social Determinants of Health   Financial Resource Strain:    Difficulty of Paying Living Expenses: Not on file  Food Insecurity:    Worried About [place] in the Last Year: Not on file   Ran Out of Food in the Last Year: Not on file  Transportation Needs:    Lack of Transportation (Medical): Not on file   Lack of Transportation (Non-Medical): Not on file  Physical Activity:    Days of Exercise per Week: Not on file   Minutes of Exercise per Session: Not on file  Stress:    Feeling of Stress : Not on file  Social Connections:    Frequency of Communication with Friends and Family: Not on file   Frequency of Social Gatherings with Friends and Family: Not on file   Attends Religious Services: Not on file   Active Member of Clubs or Organizations: Not on file   Attends Club or Organization Meetings: Not  on file   Marital Status: Not on file    Outpatient Encounter Medications as of 09/19/2019  Medication Sig   acetaminophen (TYLENOL) 325 MG tablet Take 325-650 mg by mouth every 6 (six) hours as needed for mild pain or headache.    calcium carbonate (TUMS EX) 750 MG chewable tablet Chew 2 tablets by mouth as needed for heartburn.   Calcium Carbonate-Vit D-Min (CALTRATE PLUS PO) Take by mouth.   Cholecalciferol (VITAMIN D3) 50 MCG (2000 UT) TABS Take 2,000 Units by mouth daily.   Clobetasol Prop Emollient Base (CLOBETASOL PROPIONATE E) 0.05 % emollient cream  Apply 1 application topically every Sunday.    desonide (DESOWEN) 0.05 % ointment Apply 1 application topically 2 (two) times daily as needed (for psoriasis).    famotidine (PEPCID) 20 MG tablet Take 20 mg by mouth 2 (two) times daily.   HUMIRA PEN 40 MG/0.4ML PNKT Inject 0.4 mLs into the skin every 14 (fourteen) days.   Methylcellulose, Laxative, (CITRUCEL PO) Take 1 Dose by mouth.   metroNIDAZOLE (METROCREAM) 0.75 % cream Apply topically 2 (two) times daily as needed (facial redness/irritation.).    nystatin cream (MYCOSTATIN) Apply 1 application topically 2 (two) times daily as needed for dry skin.    sucralfate (CARAFATE) 1 g tablet Take 1 g by mouth daily.   tacrolimus (PROTOPIC) 0.1 % ointment Apply topically 2 (two) times daily as needed (for psoriasis).    valsartan (DIOVAN) 80 MG tablet Take 1 tablet (80 mg total) by mouth daily.   rosuvastatin (CRESTOR) 5 MG tablet Take 1 tablet (5 mg total) by mouth daily.   [DISCONTINUED] guaiFENesin (MUCINEX) 600 MG 12 hr tablet Take 1 tablet (600 mg total) by mouth 2 (two) times daily.   [DISCONTINUED] lidocaine (LIDODERM) 5 % Place 1 patch onto the skin daily. Remove & Discard patch within 12 hours or as directed by MD   [DISCONTINUED] pantoprazole (PROTONIX) 40 MG tablet Take 1 tablet (40 mg total) by mouth daily. (Patient taking differently: Take 20 mg by mouth daily  before breakfast. 30 minutes prior to breakfast)   No facility-administered encounter medications on file as of 09/19/2019.    Activities of Daily Living In your present state of health, do you have any difficulty performing the following activities: 09/19/2019 11/05/2018  Hearing? Y Y  Comment - -  Vision? N N  Difficulty concentrating or making decisions? N N  Walking or climbing stairs? N -  Comment - -  Dressing or bathing? N N  Doing errands, shopping? N -  Preparing Food and eating ? N -  Using the Toilet? N -  In the past six months, have you accidently leaked urine? N -  Do you have problems with loss of bowel control? N -  Managing your Medications? N -  Managing your Finances? N -  Housekeeping or managing your Housekeeping? N -  Some recent data might be hidden    Patient Care Team: Leone Haven, MD as PCP - General (Family Medicine) Rockey Situ Kathlene November, MD as PCP - Cardiology (Cardiology) Thompson Grayer, MD as PCP - Electrophysiology (Cardiology)    Assessment:   This is a routine wellness examination for Alison Huffman.  Nurse connected with patient 09/19/19 at  9:00 AM EST by a telephone enabled telemedicine application and verified that I am speaking with the correct person using two identifiers. Patient stated full name and DOB. Patient gave permission to continue with virtual visit. Patient's location was at home and Nurse's location was at [place] office.   Patient is alert and oriented x3. Patient denies difficulty focusing or concentrating. Patient likes to read and play games on her tablet for brain stimulation.   Health Maintenance Due: See completed HM at the end of note.   Eye: Visual acuity not assessed. Virtual visit. Followed by their ophthalmologist.  Dental: Visits every 6 months.    Hearing: Hearing aids- yes  Safety:  Patient feels safe at home- yes Patient does have smoke detectors at home- yes Patient does wear sunscreen or protective  clothing when in direct sunlight - yes Patient does wear  seat belt when in a moving vehicle - yes Patient drives- yes Adequate lighting in walkways free from debris- yes Grab bars and handrails used as appropriate- yes Ambulates with an assistive device- no Cell phone on person when ambulating outside of the home- yes  Social: Alcohol intake - yes      Smoking history- former   Smokers in home? none Illicit drug use? none  Medication: Taking as directed and without issues.  Pill box in use -yes  Self managed - yes   Covid-19: Precautions and sickness symptoms discussed. Wears mask, social distancing, hand hygiene as appropriate.   Activities of Daily Living Patient denies needing assistance with: household chores, feeding themselves, getting from bed to chair, getting to the toilet, bathing/showering, dressing, managing money, or preparing meals.   Discussed the importance of a healthy diet, water intake and the benefits of aerobic exercise.  Educational material provided.  Physical activity- walking 1 mile daily. Cardio exercises with tv.  Diet:  Regular Water: good intake  Other Providers Patient Care Team: Leone Haven, MD as PCP - General (Family Medicine) Minna Merritts, MD as PCP - Cardiology (Cardiology) Thompson Grayer, MD as PCP - Electrophysiology (Cardiology)  Exercise Activities and Dietary recommendations Current Exercise Habits: Home exercise routine, Type of exercise: walking, Intensity: Mild  Goals     Weight 145lbs     Increase physical activity; add stairs to walking routine Low carb diet; portion control       Fall Risk Fall Risk  09/19/2019 09/20/2018 07/29/2018 05/03/2018 03/21/2018  Falls in the past year? 0 0 No No No  Number falls in past yr: 0 0 - - -  Injury with Fall? - 0 - - -  Follow up Falls prevention discussed - - - -   Timed Get Up and Go performed: no, virtual visit  Depression Screen PHQ 2/9 Scores 09/19/2019  09/20/2018 09/06/2018 07/29/2018  PHQ - 2 Score 0 0 0 1  PHQ- 9 Score - - 2 -     Cognitive Function MMSE - Mini Mental State Exam 05/03/2018  Orientation to time 5  Orientation to Place 5  Registration 3  Attention/ Calculation 5  Recall 3  Language- name 2 objects 2  Language- repeat 1  Language- follow 3 step command 3  Language- read & follow direction 1  Write a sentence 1  Copy design 1  Total score 30     6CIT Screen 09/19/2019  What Year? 0 points  What month? 0 points  What time? 0 points  Count back from 20 0 points  Months in reverse 0 points  Repeat phrase 0 points  Total Score 0    Immunization History  Administered Date(s) Administered   Fluad Quad(high Dose 65+) 06/18/2019   Hepatitis B 01/01/1990, 01/31/1990, 07/03/1990   Influenza-Unspecified 06/18/2017, 07/10/2018   Pneumococcal Conjugate-13 05/06/2014   Pneumococcal Polysaccharide-23 05/10/2005   Tdap 09/04/2017   Zoster 08/11/2008   Zoster Recombinat (Shingrix) 08/26/2019   Screening Tests Health Maintenance  Topic Date Due   TETANUS/TDAP  09/05/2027   INFLUENZA VACCINE  Completed   DEXA SCAN  Completed   PNA vac Low Risk Adult  Completed      Plan:   Keep all routine maintenance appointments.   Follow up 09/22/19 @ 930  Medicare Attestation I have personally reviewed: The patient's medical and social history Their use of alcohol, tobacco or illicit drugs Their current medications and supplements The patient's functional ability including ADLs,fall  risks, home safety risks, cognitive, and hearing and visual impairment Diet and physical activities Evidence for depression   I have reviewed and discussed with patient certain preventive protocols, quality metrics, and best practice recommendations.     Varney Biles, LPN  [phone]

## 2019-09-22 ENCOUNTER — Encounter: Payer: Self-pay | Admitting: Family Medicine

## 2019-09-22 ENCOUNTER — Ambulatory Visit (INDEPENDENT_AMBULATORY_CARE_PROVIDER_SITE_OTHER): Payer: Medicare Other | Admitting: Family Medicine

## 2019-09-22 ENCOUNTER — Ambulatory Visit: Payer: Medicare Other

## 2019-09-22 ENCOUNTER — Other Ambulatory Visit: Payer: Self-pay

## 2019-09-22 DIAGNOSIS — E782 Mixed hyperlipidemia: Secondary | ICD-10-CM

## 2019-09-22 DIAGNOSIS — I441 Atrioventricular block, second degree: Secondary | ICD-10-CM | POA: Diagnosis not present

## 2019-09-22 DIAGNOSIS — Z1231 Encounter for screening mammogram for malignant neoplasm of breast: Secondary | ICD-10-CM

## 2019-09-22 DIAGNOSIS — I10 Essential (primary) hypertension: Secondary | ICD-10-CM

## 2019-09-22 DIAGNOSIS — L4050 Arthropathic psoriasis, unspecified: Secondary | ICD-10-CM | POA: Diagnosis not present

## 2019-09-22 DIAGNOSIS — Z1239 Encounter for other screening for malignant neoplasm of breast: Secondary | ICD-10-CM | POA: Insufficient documentation

## 2019-09-22 NOTE — Assessment & Plan Note (Signed)
She will continue to see electrophysiology.

## 2019-09-22 NOTE — Assessment & Plan Note (Signed)
She will continue management through rheumatology.

## 2019-09-22 NOTE — Assessment & Plan Note (Signed)
Continue Crestor.  Plan for lab work in the next week or so.

## 2019-09-22 NOTE — Assessment & Plan Note (Signed)
Mammogram is up-to-date.  We will have her come in for an exam given the intermittent nipple inversion.  I suspect this is a benign finding given that it reverts to normal and has been present for many years with multiple benign mammograms.

## 2019-09-22 NOTE — Progress Notes (Signed)
Virtual Visit via telephone Note  This visit type was conducted due to national recommendations for restrictions regarding the COVID-19 pandemic (e.g. social distancing).  This format is felt to be most appropriate for this patient at this time.  All issues noted in this document were discussed and addressed.  No physical exam was performed (except for noted visual exam findings with Video Visits).   I connected with Alison Huffman today at  9:30 AM EST by telephone and verified that I am speaking with the correct person using two identifiers. Location patient: home Location provider: work  Persons participating in the virtual visit: patient, provider  I discussed the limitations, risks, security and privacy concerns of performing an evaluation and management service by telephone and the availability of in person appointments. I also discussed with the patient that there may be a patient responsible charge related to this service. The patient expressed understanding and agreed to proceed.  Interactive audio and video telecommunications were attempted between this provider and patient, however failed, due to patient having technical difficulties OR patient did not have access to video capability.  We continued and completed visit with audio only.   Reason for visit: follow-up  HPI: HYPERTENSION  Disease Monitoring  Home BP Monitoring 120's/60s Chest pain- no    Dyspnea- no Medications  Compliance-  Taking valsartan.  Edema- minimal, occasional, goes down on its own, nor orthopnea or PND  HYPERLIPIDEMIA Symptoms Chest pain on exertion:  no   Leg claudication:   no Medications: Compliance- taking crestor 5 mg daily Right upper quadrant pain- no  Muscle aches- no  Breast cancer screening: The patient notes that she has been up-to-date on mammograms.  She notes she received a letter advising that she should have a physical exam breast exam as well as her mammogram.  Most recent mammogram  was from August 2020.  This was normal.  She notes no new lumps.  She notes one of her nipples occasionally inverts and that has been going on for about 5 years.  She will change position and will revert to normal.  Psoriatic arthritis: This is relatively well controlled on Humira.  She follows with rheumatology.  Her psoriasis is better.  She will have an occasional flareup in random joints and she did discuss that with her rheumatologist.  Second-degree AV block Mobitz type II: Patient has a pacemaker in place.  She is doing well with this.  She follows with electrophysiology.  ROS: See pertinent positives and negatives per HPI.  Past Medical History:  Diagnosis Date  . Ankle pain, right   . Atypical chest pain    a. 10/2018 MV: EF 57%. No ischemia/infarct.  . Coronary artery disease   . Diastolic dysfunction    a. 04/2018 Echo: EF 55-60%, no rwma, Gr1 DD. Triv AI, mild MR. Mod dil LA. Nl RV fxn. PASP nl.  . GERD (gastroesophageal reflux disease)   . Headache    "dull one sometimes daily, at least weekly in last couple months" (07/22/2018)  . Hyperlipidemia   . Hypertension   . Inflammatory neuropathy ([place])   . Joint pain in fingers of right hand   . Lichen sclerosus   . Osteopenia   . Pneumonia 2012? X 1  . 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 Psoriasis   . Psoriatic arthritis ([place])    " dx'd the 1st of this year, 2019" (07/22/2018)  . Psoriatic arthritis ([place])   . Rosacea   . Seborrheic  keratosis   . Second degree heart block    a. 07/2018 s/p SJM Assurity MRI model PM2271 (Ser# [id number]).  . Sleep apnea    "using nasal pilllows since ~ 12/2017" (07/22/2018)  . TMJ (dislocation of temporomandibular joint)     Past Surgical History:  Procedure Laterality Date  . APPENDECTOMY    . BREAST BIOPSY Left 1978   "benign"  . BREAST CYST ASPIRATION Bilateral   . COLONOSCOPY    . COLONOSCOPY WITH PROPOFOL N/A 07/10/2019   Procedure: COLONOSCOPY WITH PROPOFOL;   Surgeon: Lollie Sails, MD;  Location: [facility] ENDOSCOPY;  Service: Endoscopy;  Laterality: N/A;  . ESOPHAGOGASTRODUODENOSCOPY (EGD) WITH PROPOFOL N/A 07/10/2019   Procedure: ESOPHAGOGASTRODUODENOSCOPY (EGD) WITH PROPOFOL;  Surgeon: Lollie Sails, MD;  Location: [facility] ENDOSCOPY;  Service: Endoscopy;  Laterality: N/A;  . HYSTEROSCOPY WITH D & C N/A 11/05/2018   Procedure: DILATATION AND CURETTAGE /HYSTEROSCOPY;  Surgeon: Homero Fellers, MD;  Location: [facility] ORS;  Service: Gynecology;  Laterality: N/A;  . INSERT / REPLACE / REMOVE PACEMAKER    . 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 UTERINE POLYPS REMOVAL      Family History  Problem Relation Age of Onset  . Stroke Mother   . Atrial fibrillation Mother   . Breast cancer Mother   . Stroke Father   . Breast cancer Maternal Aunt   . Breast cancer Paternal Aunt     SOCIAL HX: Former smoker   Current Outpatient Medications:  .  acetaminophen (TYLENOL) 325 MG tablet, Take 325-650 mg by mouth every 6 (six) hours as needed for mild pain or headache. , Disp: , Rfl:  .  calcium carbonate (TUMS EX) 750 MG chewable tablet, Chew 2 tablets by mouth as needed for heartburn., Disp: , Rfl:  .  Calcium Carbonate-Vit D-Min (CALTRATE PLUS PO), Take by mouth., Disp: , Rfl:  .  Cholecalciferol (VITAMIN D3) 50 MCG (2000 UT) TABS, Take 2,000 Units by mouth daily., Disp: , Rfl:  .  Clobetasol Prop Emollient Base (CLOBETASOL PROPIONATE E) 0.05 % emollient cream, Apply 1 application topically every Sunday. , Disp: , Rfl:  .  desonide (DESOWEN) 0.05 % ointment, Apply 1 application topically 2 (two) times daily as needed (for psoriasis). , Disp: , Rfl:  .  famotidine (PEPCID) 20 MG tablet, Take 20 mg by mouth 2 (two) times daily., Disp: , Rfl:  .  HUMIRA PEN 40 MG/0.4ML PNKT, Inject 0.4 mLs into the skin every 14 (fourteen) days., Disp: , Rfl:  .  Methylcellulose, Laxative,  (CITRUCEL PO), Take 1 Dose by mouth., Disp: , Rfl:  .  metroNIDAZOLE (METROCREAM) 0.75 % cream, Apply topically 2 (two) times daily as needed (facial redness/irritation.). , Disp: , Rfl:  .  nystatin cream (MYCOSTATIN), Apply 1 application topically 2 (two) times daily as needed for dry skin. , Disp: , Rfl:  .  sucralfate (CARAFATE) 1 g tablet, Take 1 g by mouth daily., Disp: , Rfl:  .  tacrolimus (PROTOPIC) 0.1 % ointment, Apply topically 2 (two) times daily as needed (for psoriasis). , Disp: , Rfl:  .  valsartan (DIOVAN) 80 MG tablet, Take 1 tablet (80 mg total) by mouth daily., Disp: 90 tablet, Rfl: 0 .  rosuvastatin (CRESTOR) 5 MG tablet, Take 1 tablet (5 mg total) by mouth daily., Disp: 90 tablet, Rfl: 3  EXAM: This was a telehealth telephone visit and thus no physical  exam was completed.  ASSESSMENT AND PLAN:  Discussed the following assessment and plan:  Second degree AV block, Mobitz type II She will continue to see electrophysiology.  Hypertension Well-controlled.  Minimal occasional swelling.  Plan for lab work when she follows up in the next week or so for a breast exam.  Psoriatic arthritis [facility]) She will continue management through rheumatology.  Hyperlipidemia Continue Crestor.  Plan for lab work in the next week or so.  Breast cancer screening Mammogram is up-to-date.  We will have her come in for an exam given the intermittent nipple inversion.  I suspect this is a benign finding given that it reverts to normal and has been present for many years with multiple benign mammograms.    I discussed the assessment and treatment plan with the patient. The patient was provided an opportunity to ask questions and all were answered. The patient agreed with the plan and demonstrated an understanding of the instructions.   The patient was advised to call back or seek an in-person evaluation if the symptoms worsen or if the condition fails to improve as anticipated.  I provided  17 minutes of non-face-to-face time during this encounter.   Tommi Rumps, MD

## 2019-09-22 NOTE — Assessment & Plan Note (Signed)
Well-controlled.  Minimal occasional swelling.  Plan for lab work when she follows up in the next week or so for a breast exam.

## 2019-09-29 ENCOUNTER — Other Ambulatory Visit: Payer: Self-pay

## 2019-10-01 ENCOUNTER — Ambulatory Visit (INDEPENDENT_AMBULATORY_CARE_PROVIDER_SITE_OTHER): Payer: Medicare Other | Admitting: Family Medicine

## 2019-10-01 ENCOUNTER — Other Ambulatory Visit: Payer: Self-pay

## 2019-10-01 ENCOUNTER — Encounter: Payer: Self-pay | Admitting: Family Medicine

## 2019-10-01 VITALS — BP 130/60 | HR 71 | Temp 98.7°F | Ht 66.0 in | Wt 170.8 lb

## 2019-10-01 DIAGNOSIS — L900 Lichen sclerosus et atrophicus: Secondary | ICD-10-CM

## 2019-10-01 DIAGNOSIS — I10 Essential (primary) hypertension: Secondary | ICD-10-CM

## 2019-10-01 DIAGNOSIS — E782 Mixed hyperlipidemia: Secondary | ICD-10-CM

## 2019-10-01 DIAGNOSIS — N6459 Other signs and symptoms in breast: Secondary | ICD-10-CM | POA: Insufficient documentation

## 2019-10-01 NOTE — Assessment & Plan Note (Signed)
Potentially benign given chronicity and that it resolves with sitting up most of the time.  Worsening is somewhat concerning.  We will get a diagnostic mammogram.

## 2019-10-01 NOTE — Assessment & Plan Note (Signed)
Well-controlled with clobetasol.  She will continue to see dermatology.

## 2019-10-01 NOTE — Progress Notes (Signed)
Tommi Rumps, MD Phone: [phone]  Alison Huffman is a 78 y.o. female who presents today for follow-up.  Chronic right nipple inversion: Patient presents for a breast exam today.  She has chronic intermittent right nipple inversion.  Reverts to normal with change in position.  No nipple discharge.  No nipple bleeding.  No breast tenderness.  Does have a history of cyst removal in 1978 and intermittent cyst aspirations over the year.  Lichen sclerosus: Well controlled with clobetasol.  No significant issues.  She continues to follow with dermatology.  Social History   Tobacco Use  Smoking Status Former Smoker  . Types: Cigarettes  Smokeless Tobacco Never Used  Tobacco Comment   07/22/2018 "smoked some in college; 1960s;  nothing since"     ROS see history of present illness  Objective  Physical Exam Vitals:   10/01/19 1625  BP: 130/60  Pulse: 71  Temp: 98.7 F (37.1 C)  SpO2: 96%    BP Readings from Last 3 Encounters:  10/01/19 130/60  09/19/19 122/61  07/10/19 (!) 134/51   Wt Readings from Last 3 Encounters:  10/01/19 170 lb 12.8 oz (77.5 kg)  09/22/19 165 lb (74.8 kg)  09/19/19 169 lb (76.7 kg)    Physical Exam Constitutional:      General: She is not in acute distress. Pulmonary:     Effort: Pulmonary effort is normal.  Genitourinary:    Comments: Alison Huffman, CMA served as chaperone for this exam, right breast with nipple inversion on raising her arms above her head and on laying down, no tenderness, masses, or nipple discharge, no axillary masses on the right side, left breast with no nipple inversion, tenderness, masses, or nipple discharge, no axillary masses on the left. Neurological:     Mental Status: She is alert.      Assessment/Plan: Please see individual problem list.  Inversion of right nipple Potentially benign given chronicity and that it resolves with sitting up most of the time.  Worsening is somewhat concerning.  We will get a  diagnostic mammogram.  Lichen sclerosus Well-controlled with clobetasol.  She will continue to see dermatology.    Orders Placed This Encounter  Procedures  . MM DIAG BREAST TOMO UNI RIGHT    Standing Status:   Future    Standing Expiration Date:   11/29/2020    Order Specific Question:   Reason for Exam (SYMPTOM  OR DIAGNOSIS REQUIRED)    Answer:   nipple inversion that has been going on for several years, though is now worse, resolves most of the time with sitting up    Order Specific Question:   Preferred imaging location?    Answer:   [facility]  . US BREAST LTD UNI RIGHT INC AXILLA    Standing Status:   Future    Standing Expiration Date:   11/29/2020    Order Specific Question:   Reason for Exam (SYMPTOM  OR DIAGNOSIS REQUIRED)    Answer:   Right nipple inversion that has been going on for several years and occurs intermittently on raising her arms above her head or on laying down, resolves with sitting up on most occasions    Order Specific Question:   Preferred imaging location?    Answer:   [facility]  . Basic Metabolic Panel (BMET)  . Direct LDL    No orders of the defined types were placed in this encounter.   This visit occurred during the SARS-CoV-2 public health emergency.  Safety  protocols were in place, including screening questions prior to the visit, additional usage of staff PPE, and extensive cleaning of exam room while observing appropriate contact time as indicated for disinfecting solutions.    Tommi Rumps, MD [place]

## 2019-10-01 NOTE — Patient Instructions (Signed)
Nice to see you. We will get a mammogram scheduled for you.  If you do not hear anything about this tomorrow please let us know.

## 2019-10-02 ENCOUNTER — Telehealth: Payer: Self-pay

## 2019-10-02 DIAGNOSIS — E782 Mixed hyperlipidemia: Secondary | ICD-10-CM

## 2019-10-02 DIAGNOSIS — I10 Essential (primary) hypertension: Secondary | ICD-10-CM

## 2019-10-02 NOTE — Telephone Encounter (Signed)
Noted. Please contact the patient and get her rescheduled. Please apologize for any inconvenience.

## 2019-10-02 NOTE — Telephone Encounter (Signed)
I called and informed the patient that the labs that were done on yesterday was left out and needed to be redrawn, I apologized to the patient for the inconvenience and I scheduled her for a redraw on Monday, patient was p[polite and understood.  I also gave her her appointment time and date for her mammogram @ [facility].  Jaedyn Lard,cma

## 2019-10-06 ENCOUNTER — Other Ambulatory Visit (INDEPENDENT_AMBULATORY_CARE_PROVIDER_SITE_OTHER): Payer: Medicare Other

## 2019-10-06 ENCOUNTER — Other Ambulatory Visit: Payer: Self-pay

## 2019-10-06 DIAGNOSIS — E782 Mixed hyperlipidemia: Secondary | ICD-10-CM | POA: Diagnosis not present

## 2019-10-06 DIAGNOSIS — I10 Essential (primary) hypertension: Secondary | ICD-10-CM

## 2019-10-06 LAB — BASIC METABOLIC PANEL
BUN: 12 mg/dL (ref 6–23)
CO2: 28 mEq/L (ref 19–32)
Calcium: 9.7 mg/dL (ref 8.4–10.5)
Chloride: 104 mEq/L (ref 96–112)
Creatinine, Ser: 0.59 mg/dL (ref 0.40–1.20)
GFR: 98.09 mL/min (ref >60.00)
Glucose, Bld: 77 mg/dL (ref 70–99)
Potassium: 4.1 mEq/L (ref 3.5–5.1)
Sodium: 138 mEq/L (ref 135–145)

## 2019-10-06 LAB — LDL CHOLESTEROL, DIRECT: Direct LDL: 94 mg/dL

## 2019-10-07 ENCOUNTER — Ambulatory Visit
Admission: RE | Admit: 2019-10-07 | Discharge: 2019-10-07 | Disposition: A | Discharge status: Home / Self Care | Payer: Medicare Other | Source: Ambulatory Visit | Attending: Family Medicine | Admitting: Family Medicine

## 2019-10-07 DIAGNOSIS — N6459 Other signs and symptoms in breast: Secondary | ICD-10-CM

## 2019-10-07 DIAGNOSIS — R922 Inconclusive mammogram: Secondary | ICD-10-CM | POA: Diagnosis not present

## 2019-10-10 ENCOUNTER — Other Ambulatory Visit: Payer: Medicare Other

## 2019-10-15 ENCOUNTER — Ambulatory Visit (INDEPENDENT_AMBULATORY_CARE_PROVIDER_SITE_OTHER): Payer: Medicare Other | Admitting: *Deleted

## 2019-10-15 DIAGNOSIS — I441 Atrioventricular block, second degree: Secondary | ICD-10-CM | POA: Diagnosis not present

## 2019-10-15 LAB — CUP PACEART REMOTE DEVICE CHECK
Battery Remaining Longevity: 127 mo
Battery Remaining Percentage: 95.5 %
Battery Voltage: 3.01 V
Brady Statistic AP VP Percent: 1 %
Brady Statistic AP VS Percent: 9.5 %
Brady Statistic AS VP Percent: 9.2 %
Brady Statistic AS VS Percent: 78 %
Brady Statistic RA Percent Paced: 7.2 %
Brady Statistic RV Percent Paced: 10 %
Date Time Interrogation Session: 20210113020012
Implantable Lead Implant Date: 20191022
Implantable Lead Implant Date: 20191022
Implantable Lead Location: 753859
Implantable Lead Location: 753860
Implantable Pulse Generator Implant Date: 20191022
Lead Channel Impedance Value: 650 Ohm
Lead Channel Impedance Value: 750 Ohm
Lead Channel Pacing Threshold Amplitude: 0.5 V
Lead Channel Pacing Threshold Amplitude: 0.625 V
Lead Channel Pacing Threshold Pulse Width: 0.5 ms
Lead Channel Pacing Threshold Pulse Width: 0.5 ms
Lead Channel Sensing Intrinsic Amplitude: 12 mV
Lead Channel Sensing Intrinsic Amplitude: 4.9 mV
Lead Channel Setting Pacing Amplitude: 0.875
Lead Channel Setting Pacing Amplitude: 2 V
Lead Channel Setting Pacing Pulse Width: 0.5 ms
Lead Channel Setting Sensing Sensitivity: 2 mV
Pulse Gen Model: 2272
Pulse Gen Serial Number: 9073848

## 2019-10-23 ENCOUNTER — Other Ambulatory Visit: Payer: Self-pay

## 2019-10-23 ENCOUNTER — Encounter: Payer: Self-pay | Admitting: Nurse Practitioner

## 2019-10-23 ENCOUNTER — Ambulatory Visit (INDEPENDENT_AMBULATORY_CARE_PROVIDER_SITE_OTHER): Payer: Medicare Other | Admitting: Nurse Practitioner

## 2019-10-23 VITALS — BP 148/64 | HR 63 | Ht 66.0 in | Wt 170.2 lb

## 2019-10-23 DIAGNOSIS — I10 Essential (primary) hypertension: Secondary | ICD-10-CM

## 2019-10-23 DIAGNOSIS — I471 Supraventricular tachycardia: Secondary | ICD-10-CM | POA: Diagnosis not present

## 2019-10-23 DIAGNOSIS — I441 Atrioventricular block, second degree: Secondary | ICD-10-CM | POA: Diagnosis not present

## 2019-10-23 DIAGNOSIS — G4733 Obstructive sleep apnea (adult) (pediatric): Secondary | ICD-10-CM | POA: Diagnosis not present

## 2019-10-23 NOTE — Progress Notes (Signed)
Electrophysiology Office Note Date: 10/23/2019  ID:  [name], DOB March 22, 1941, MRN [id number]  PCP: Alison Haven, MD Primary Cardiologist: Rockey Situ Electrophysiologist: Allred  CC: Pacemaker follow-up  Alison Huffman is a 79 y.o. female seen today for Dr Rayann Heman.  She presents today for routine electrophysiology followup.  Since last being seen in our clinic, the patient reports doing relatively well.  She has gotten her first dose of the COVID vaccine.  She notices intermittent fatigue associated with heart rates in the 120's.   She denies chest pain, dyspnea, PND, orthopnea, nausea, vomiting, dizziness, syncope, edema, weight gain, or early satiety.  Device History: STJ dual chamber PPM implanted 2019 for Mobitz II    Past Medical History:  Diagnosis Date  . Ankle pain, right   . Atypical chest pain    a. 10/2018 MV: EF 57%. No ischemia/infarct.  . Coronary artery disease   . Diastolic dysfunction    a. 04/2018 Echo: EF 55-60%, no rwma, Gr1 DD. Triv AI, mild MR. Mod dil LA. Nl RV fxn. PASP nl.  . GERD (gastroesophageal reflux disease)   . Headache    "dull one sometimes daily, at least weekly in last couple months" (07/22/2018)  . Hyperlipidemia   . Hypertension   . 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 Joint pain in fingers of right hand   . Lichen sclerosus   . Osteopenia   . Pneumonia 2012? X 1  . 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 Psoriasis   . Psoriatic arthritis ([place])    " dx'd the 1st of this year, 2019" (07/22/2018)  . Psoriatic arthritis ([place])   . Rosacea   . Seborrheic keratosis   . Second degree heart block    a. 07/2018 s/p SJM Assurity MRI model PM2271 (Ser# [id number]).  . Sleep apnea    "using nasal pilllows since ~ 12/2017" (07/22/2018)  . TMJ (dislocation of temporomandibular joint)    Past Surgical History:  Procedure Laterality Date  . APPENDECTOMY    . BREAST CYST ASPIRATION Bilateral   . BREAST CYST EXCISION Right 1978     benign  . COLONOSCOPY    . COLONOSCOPY WITH PROPOFOL N/A 07/10/2019   Procedure: COLONOSCOPY WITH PROPOFOL;  Surgeon: Lollie Sails, MD;  Location: [facility] ENDOSCOPY;  Service: Endoscopy;  Laterality: N/A;  . ESOPHAGOGASTRODUODENOSCOPY (EGD) WITH PROPOFOL N/A 07/10/2019   Procedure: ESOPHAGOGASTRODUODENOSCOPY (EGD) WITH PROPOFOL;  Surgeon: Lollie Sails, MD;  Location: [facility] ENDOSCOPY;  Service: Endoscopy;  Laterality: N/A;  . HYSTEROSCOPY WITH D & C N/A 11/05/2018   Procedure: DILATATION AND CURETTAGE /HYSTEROSCOPY;  Surgeon: Homero Fellers, MD;  Location: [facility] ORS;  Service: Gynecology;  Laterality: N/A;  . INSERT / REPLACE / REMOVE PACEMAKER    . 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 UTERINE POLYPS REMOVAL      Current Outpatient Medications  Medication Sig Dispense Refill  . acetaminophen (TYLENOL) 325 MG tablet Take 325-650 mg by mouth every 6 (six) hours as needed for mild pain or headache.     . Calcium Carbonate-Vit D-Min (CALTRATE PLUS PO) Take by mouth.    . Calcium Citrate (CITRACAL PO) Take 1 Dose by mouth daily.    . Cholecalciferol (VITAMIN D3) 50 MCG (2000 UT) TABS Take 2,000 Units by mouth daily.    . Clobetasol Prop Emollient Base (CLOBETASOL PROPIONATE E) 0.05 % emollient cream Apply  1 application topically every Sunday.     [name] desonide (DESOWEN) 0.05 % ointment Apply 1 application topically 2 (two) times daily as needed (for psoriasis).     . famotidine (PEPCID) 20 MG tablet Take 20 mg by mouth 2 (two) times daily.    [name] HUMIRA PEN 40 MG/0.4ML PNKT Inject 0.4 mLs into the skin every 14 (fourteen) days.    . Methylcellulose, Laxative, (CITRUCEL PO) Take 1 Dose by mouth.    . metroNIDAZOLE (METROCREAM) 0.75 % cream Apply topically 2 (two) times daily as needed (facial redness/irritation.).     [name] nystatin cream (MYCOSTATIN) Apply 1 application topically 2 (two) times daily as needed for  dry skin.     [name] tacrolimus (PROTOPIC) 0.1 % ointment Apply topically 2 (two) times daily as needed (for psoriasis).     . valsartan (DIOVAN) 80 MG tablet Take 1 tablet (80 mg total) by mouth daily. 90 tablet 0  . rosuvastatin (CRESTOR) 5 MG tablet Take 1 tablet (5 mg total) by mouth daily. 90 tablet 3   No current facility-administered medications for this visit.    Allergies:   Patient has no known allergies.   Social History: Social History   Socioeconomic History  . Marital status: Single    Spouse name: Not on file  . Number of children: Not on file  . Years of education: Not on file  . Highest education level: Not on file  Occupational History  . Not on file  Tobacco Use  . Smoking status: Former Smoker    Types: Cigarettes  . Smokeless tobacco: Never Used  . Tobacco comment: 07/22/2018 "smoked some in college; 1960s;  nothing since"  Substance and Sexual Activity  . Alcohol use: Yes    Comment: rarely  . Drug use: Never  . Sexual activity: Not Currently    Birth control/protection: Post-menopausal  Other Topics Concern  . Not on file  Social History Narrative  . Not on file   Social Determinants of Health   Financial Resource Strain:   . Difficulty of Paying Living Expenses: Not on file  Food Insecurity:   . Worried About Charity fundraiser in the Last Year: Not on file  . Ran Out of Food in the Last Year: Not on file  Transportation Needs:   . Lack of Transportation (Medical): Not on file  . Lack of Transportation (Non-Medical): Not on file  Physical Activity:   . Days of Exercise per Week: Not on file  . Minutes of Exercise per Session: Not on file  Stress:   . Feeling of Stress : Not on file  Social Connections:   . Frequency of Communication with Friends and Family: Not on file  . Frequency of Social Gatherings with Friends and Family: Not on file  . Attends Religious Services: Not on file  . Active Member of Clubs or Organizations: Not on file  .  Attends Archivist Meetings: Not on file  . Marital Status: Not on file  Intimate Partner Violence:   . Fear of Current or Ex-Partner: Not on file  . Emotionally Abused: Not on file  . Physically Abused: Not on file  . Sexually Abused: Not on file    Family History: Family History  Problem Relation Age of Onset  . Stroke Mother   . Atrial fibrillation Mother   . Breast cancer Mother   . Stroke Father   . Breast cancer Maternal Aunt   . Breast cancer Paternal Aunt  Review of Systems: All other systems reviewed and are otherwise negative except as noted above.   Physical Exam: VS:  BP (!) 148/64   Pulse 63   Ht 5\' 6"  (1.676 m)   Wt 170 lb 3.2 oz (77.2 kg)   SpO2 98%   BMI 27.47 kg/m  , BMI Body mass index is 27.47 kg/m.  GEN- The patient is well appearing, alert and oriented x 3 today.   HEENT: normocephalic, atraumatic; sclera clear, conjunctiva pink; hearing intact; oropharynx clear; neck supple  Lungs- Clear to ausculation bilaterally, normal work of breathing.  No wheezes, rales, rhonchi Heart- Regular rate and rhythm  GI- soft, non-tender, non-distended, bowel sounds present  Extremities- no clubbing, cyanosis, or edema  MS- no significant deformity or atrophy Skin- warm and dry, no rash or lesion; PPM pocket well healed Psych- euthymic mood, full affect Neuro- strength and sensation are intact  PPM Interrogation- reviewed in detail today,  See PACEART report  EKG:  EKG is not ordered today.  Recent Labs: 11/05/2018: Hemoglobin 13.4; Platelets 224 04/25/2019: ALT 17 10/06/2019: BUN 12; Creatinine, Ser 0.59; Potassium 4.1; Sodium 138   Wt Readings from Last 3 Encounters:  10/23/19 170 lb 3.2 oz (77.2 kg)  10/01/19 170 lb 12.8 oz (77.5 kg)  09/22/19 165 lb (74.8 kg)     Other studies Reviewed: Additional studies/ records that were reviewed today include: Dr Rayann Heman and Dr Donivan Scull notes   Assessment and Plan:  1.  Symptomatic Mobitz  II Normal PPM function See Pace Art report No changes today  2.  Atrial tachycardia Burden remains low All episodes <15 minutes Offered low dose BB if episodes become more frequent  3.  HTN BP readings at home 120's Will follow    Current medicines are reviewed at length with the patient today.   The patient does not have concerns regarding her medicines.  The following changes were made today:  none  Labs/ tests ordered today include: none No orders of the defined types were placed in this encounter.    Disposition:   Follow up with Merlin, me in 1 year, Dr Rockey Situ as scheduled    Signed, Chanetta Marshall, NP 10/23/2019 10:26 AM  [place]  [address] [phone] (office) [phone] (fax)

## 2019-10-23 NOTE — Patient Instructions (Addendum)
Medication Instructions:  none *If you need a refill on your cardiac medications before your next appointment, please call your pharmacy*  Lab Work: none If you have labs (blood work) drawn today and your tests are completely normal, you will receive your results only by: [name] MyChart Message (if you have MyChart) OR . A paper copy in the mail If you have any lab test that is abnormal or we need to change your treatment, we will call you to review the results.  Testing/Procedures: none  Follow-Up: At [facility], you and your health needs are our priority.  As part of our continuing mission to provide you with exceptional heart care, we have created designated Provider Care Teams.  These Care Teams include your primary Cardiologist (physician) and Advanced Practice Providers (APPs -  Physician Assistants and Nurse Practitioners) who all work together to provide you with the care you need, when you need it.  Your next appointment:   1 year(s)  The format for your next appointment:   In Person  Provider:   Chanetta Marshall, NP  Other Instructions Remote monitoring is used to monitor your Pacemaker  from home. This monitoring reduces the number of office visits required to check your device to one time per year. It allows [place] to keep an eye on the functioning of your device to ensure it is working properly. You are scheduled for a device check from home on 01/14/20. You may send your transmission at any time that day. If you have a wireless device, the transmission will be sent automatically. After your physician reviews your transmission, you will receive a postcard with your next transmission date.

## 2019-10-24 LAB — CUP PACEART INCLINIC DEVICE CHECK
Date Time Interrogation Session: 20210121134022
Implantable Lead Implant Date: 20191022
Implantable Lead Implant Date: 20191022
Implantable Lead Location: 753859
Implantable Lead Location: 753860
Implantable Pulse Generator Implant Date: 20191022
Pulse Gen Model: 2272
Pulse Gen Serial Number: 9073848

## 2019-10-31 ENCOUNTER — Other Ambulatory Visit: Payer: Self-pay | Admitting: Family Medicine

## 2019-11-12 ENCOUNTER — Telehealth: Payer: Self-pay | Admitting: Internal Medicine

## 2019-11-12 NOTE — Telephone Encounter (Signed)
Called and spoke to pt, who stated that she is currently using nasal pillows with cpap. Pt stated that she started out with size small and did not tolerate well due to mask being too tight on her face. Pt has since started wearing size medium and over the past 4 weeks has been experiencing the same thing. Pt would like to try a different mask. I have advised pt to contact [employer], as they should be able to provide her with a new mask. Pt voiced her understanding and had no further questions.  Nothing further is needed.

## 2019-11-14 DIAGNOSIS — R2689 Other abnormalities of gait and mobility: Secondary | ICD-10-CM | POA: Diagnosis not present

## 2019-11-14 DIAGNOSIS — M25512 Pain in left shoulder: Secondary | ICD-10-CM | POA: Diagnosis not present

## 2019-11-14 DIAGNOSIS — L4050 Arthropathic psoriasis, unspecified: Secondary | ICD-10-CM | POA: Diagnosis not present

## 2019-11-14 DIAGNOSIS — M6281 Muscle weakness (generalized): Secondary | ICD-10-CM | POA: Diagnosis not present

## 2019-11-14 DIAGNOSIS — L409 Psoriasis, unspecified: Secondary | ICD-10-CM | POA: Diagnosis not present

## 2019-11-14 DIAGNOSIS — Z950 Presence of cardiac pacemaker: Secondary | ICD-10-CM | POA: Diagnosis not present

## 2019-11-14 DIAGNOSIS — M25561 Pain in right knee: Secondary | ICD-10-CM | POA: Diagnosis not present

## 2019-11-14 DIAGNOSIS — R279 Unspecified lack of coordination: Secondary | ICD-10-CM | POA: Diagnosis not present

## 2019-11-14 DIAGNOSIS — M25441 Effusion, right hand: Secondary | ICD-10-CM | POA: Diagnosis not present

## 2019-11-18 DIAGNOSIS — R2689 Other abnormalities of gait and mobility: Secondary | ICD-10-CM | POA: Diagnosis not present

## 2019-11-18 DIAGNOSIS — M25512 Pain in left shoulder: Secondary | ICD-10-CM | POA: Diagnosis not present

## 2019-11-18 DIAGNOSIS — M6281 Muscle weakness (generalized): Secondary | ICD-10-CM | POA: Diagnosis not present

## 2019-11-18 DIAGNOSIS — M25561 Pain in right knee: Secondary | ICD-10-CM | POA: Diagnosis not present

## 2019-11-18 DIAGNOSIS — R279 Unspecified lack of coordination: Secondary | ICD-10-CM | POA: Diagnosis not present

## 2019-11-18 DIAGNOSIS — Z950 Presence of cardiac pacemaker: Secondary | ICD-10-CM | POA: Diagnosis not present

## 2019-11-25 DIAGNOSIS — M6281 Muscle weakness (generalized): Secondary | ICD-10-CM | POA: Diagnosis not present

## 2019-11-25 DIAGNOSIS — M25561 Pain in right knee: Secondary | ICD-10-CM | POA: Diagnosis not present

## 2019-11-25 DIAGNOSIS — R2689 Other abnormalities of gait and mobility: Secondary | ICD-10-CM | POA: Diagnosis not present

## 2019-11-25 DIAGNOSIS — M25512 Pain in left shoulder: Secondary | ICD-10-CM | POA: Diagnosis not present

## 2019-11-25 DIAGNOSIS — R279 Unspecified lack of coordination: Secondary | ICD-10-CM | POA: Diagnosis not present

## 2019-11-25 DIAGNOSIS — Z950 Presence of cardiac pacemaker: Secondary | ICD-10-CM | POA: Diagnosis not present

## 2019-11-27 DIAGNOSIS — R279 Unspecified lack of coordination: Secondary | ICD-10-CM | POA: Diagnosis not present

## 2019-11-27 DIAGNOSIS — M25561 Pain in right knee: Secondary | ICD-10-CM | POA: Diagnosis not present

## 2019-11-27 DIAGNOSIS — R2689 Other abnormalities of gait and mobility: Secondary | ICD-10-CM | POA: Diagnosis not present

## 2019-11-27 DIAGNOSIS — M6281 Muscle weakness (generalized): Secondary | ICD-10-CM | POA: Diagnosis not present

## 2019-11-27 DIAGNOSIS — Z950 Presence of cardiac pacemaker: Secondary | ICD-10-CM | POA: Diagnosis not present

## 2019-11-27 DIAGNOSIS — M25512 Pain in left shoulder: Secondary | ICD-10-CM | POA: Diagnosis not present

## 2019-12-02 DIAGNOSIS — M25512 Pain in left shoulder: Secondary | ICD-10-CM | POA: Diagnosis not present

## 2019-12-02 DIAGNOSIS — M6281 Muscle weakness (generalized): Secondary | ICD-10-CM | POA: Diagnosis not present

## 2019-12-02 DIAGNOSIS — Z950 Presence of cardiac pacemaker: Secondary | ICD-10-CM | POA: Diagnosis not present

## 2019-12-02 DIAGNOSIS — R279 Unspecified lack of coordination: Secondary | ICD-10-CM | POA: Diagnosis not present

## 2019-12-02 DIAGNOSIS — M25561 Pain in right knee: Secondary | ICD-10-CM | POA: Diagnosis not present

## 2019-12-02 DIAGNOSIS — R2689 Other abnormalities of gait and mobility: Secondary | ICD-10-CM | POA: Diagnosis not present

## 2019-12-04 DIAGNOSIS — M25512 Pain in left shoulder: Secondary | ICD-10-CM | POA: Diagnosis not present

## 2019-12-04 DIAGNOSIS — R2689 Other abnormalities of gait and mobility: Secondary | ICD-10-CM | POA: Diagnosis not present

## 2019-12-04 DIAGNOSIS — Z950 Presence of cardiac pacemaker: Secondary | ICD-10-CM | POA: Diagnosis not present

## 2019-12-04 DIAGNOSIS — M6281 Muscle weakness (generalized): Secondary | ICD-10-CM | POA: Diagnosis not present

## 2019-12-04 DIAGNOSIS — M25561 Pain in right knee: Secondary | ICD-10-CM | POA: Diagnosis not present

## 2019-12-04 DIAGNOSIS — R279 Unspecified lack of coordination: Secondary | ICD-10-CM | POA: Diagnosis not present

## 2019-12-09 DIAGNOSIS — M25512 Pain in left shoulder: Secondary | ICD-10-CM | POA: Diagnosis not present

## 2019-12-09 DIAGNOSIS — Z950 Presence of cardiac pacemaker: Secondary | ICD-10-CM | POA: Diagnosis not present

## 2019-12-09 DIAGNOSIS — R2689 Other abnormalities of gait and mobility: Secondary | ICD-10-CM | POA: Diagnosis not present

## 2019-12-09 DIAGNOSIS — M25561 Pain in right knee: Secondary | ICD-10-CM | POA: Diagnosis not present

## 2019-12-09 DIAGNOSIS — M6281 Muscle weakness (generalized): Secondary | ICD-10-CM | POA: Diagnosis not present

## 2019-12-09 DIAGNOSIS — R279 Unspecified lack of coordination: Secondary | ICD-10-CM | POA: Diagnosis not present

## 2019-12-11 ENCOUNTER — Ambulatory Visit
Admission: RE | Admit: 2019-12-11 | Discharge: 2019-12-11 | Disposition: A | Discharge status: Home / Self Care | Payer: Medicare Other | Source: Ambulatory Visit | Attending: Obstetrics and Gynecology | Admitting: Obstetrics and Gynecology

## 2019-12-11 ENCOUNTER — Ambulatory Visit: Payer: Medicare Other | Admitting: Obstetrics and Gynecology

## 2019-12-11 ENCOUNTER — Other Ambulatory Visit: Payer: Self-pay

## 2019-12-11 DIAGNOSIS — N839 Noninflammatory disorder of ovary, fallopian tube and broad ligament, unspecified: Secondary | ICD-10-CM | POA: Diagnosis not present

## 2019-12-11 DIAGNOSIS — N83202 Unspecified ovarian cyst, left side: Secondary | ICD-10-CM

## 2019-12-12 ENCOUNTER — Ambulatory Visit (INDEPENDENT_AMBULATORY_CARE_PROVIDER_SITE_OTHER): Payer: Medicare Other | Admitting: Obstetrics and Gynecology

## 2019-12-12 ENCOUNTER — Encounter: Payer: Self-pay | Admitting: Obstetrics and Gynecology

## 2019-12-12 VITALS — BP 128/80 | Ht 66.0 in | Wt 170.0 lb

## 2019-12-12 DIAGNOSIS — Z950 Presence of cardiac pacemaker: Secondary | ICD-10-CM | POA: Diagnosis not present

## 2019-12-12 DIAGNOSIS — N83202 Unspecified ovarian cyst, left side: Secondary | ICD-10-CM

## 2019-12-12 DIAGNOSIS — R2689 Other abnormalities of gait and mobility: Secondary | ICD-10-CM | POA: Diagnosis not present

## 2019-12-12 DIAGNOSIS — M25561 Pain in right knee: Secondary | ICD-10-CM | POA: Diagnosis not present

## 2019-12-12 DIAGNOSIS — R279 Unspecified lack of coordination: Secondary | ICD-10-CM | POA: Diagnosis not present

## 2019-12-12 DIAGNOSIS — M25512 Pain in left shoulder: Secondary | ICD-10-CM | POA: Diagnosis not present

## 2019-12-12 DIAGNOSIS — M6281 Muscle weakness (generalized): Secondary | ICD-10-CM | POA: Diagnosis not present

## 2019-12-13 LAB — OVARIAN MALIGNANCY RISK-ROMA
Cancer Antigen (CA) 125: 5.7 U/mL (ref 0.0–38.1)
HE4: 87.1 pmol/L (ref 0.0–96.9)
Postmenopausal ROMA: 1.02
Premenopausal ROMA: 2.21 — ABNORMAL HIGH

## 2019-12-13 LAB — POSTMENOPAUSAL INTERP: LOW

## 2019-12-13 LAB — PREMENOPAUSAL INTERP: HIGH

## 2019-12-16 DIAGNOSIS — M6281 Muscle weakness (generalized): Secondary | ICD-10-CM | POA: Diagnosis not present

## 2019-12-16 DIAGNOSIS — M25512 Pain in left shoulder: Secondary | ICD-10-CM | POA: Diagnosis not present

## 2019-12-16 DIAGNOSIS — M25561 Pain in right knee: Secondary | ICD-10-CM | POA: Diagnosis not present

## 2019-12-16 DIAGNOSIS — R279 Unspecified lack of coordination: Secondary | ICD-10-CM | POA: Diagnosis not present

## 2019-12-16 DIAGNOSIS — R2689 Other abnormalities of gait and mobility: Secondary | ICD-10-CM | POA: Diagnosis not present

## 2019-12-16 DIAGNOSIS — Z950 Presence of cardiac pacemaker: Secondary | ICD-10-CM | POA: Diagnosis not present

## 2019-12-18 DIAGNOSIS — R2689 Other abnormalities of gait and mobility: Secondary | ICD-10-CM | POA: Diagnosis not present

## 2019-12-18 DIAGNOSIS — M25512 Pain in left shoulder: Secondary | ICD-10-CM | POA: Diagnosis not present

## 2019-12-18 DIAGNOSIS — M6281 Muscle weakness (generalized): Secondary | ICD-10-CM | POA: Diagnosis not present

## 2019-12-18 DIAGNOSIS — Z950 Presence of cardiac pacemaker: Secondary | ICD-10-CM | POA: Diagnosis not present

## 2019-12-18 DIAGNOSIS — M25561 Pain in right knee: Secondary | ICD-10-CM | POA: Diagnosis not present

## 2019-12-18 DIAGNOSIS — R279 Unspecified lack of coordination: Secondary | ICD-10-CM | POA: Diagnosis not present

## 2019-12-23 DIAGNOSIS — Z950 Presence of cardiac pacemaker: Secondary | ICD-10-CM | POA: Diagnosis not present

## 2019-12-23 DIAGNOSIS — M25561 Pain in right knee: Secondary | ICD-10-CM | POA: Diagnosis not present

## 2019-12-23 DIAGNOSIS — M25512 Pain in left shoulder: Secondary | ICD-10-CM | POA: Diagnosis not present

## 2019-12-23 DIAGNOSIS — R279 Unspecified lack of coordination: Secondary | ICD-10-CM | POA: Diagnosis not present

## 2019-12-23 DIAGNOSIS — M6281 Muscle weakness (generalized): Secondary | ICD-10-CM | POA: Diagnosis not present

## 2019-12-23 DIAGNOSIS — R2689 Other abnormalities of gait and mobility: Secondary | ICD-10-CM | POA: Diagnosis not present

## 2019-12-25 DIAGNOSIS — M25512 Pain in left shoulder: Secondary | ICD-10-CM | POA: Diagnosis not present

## 2019-12-25 DIAGNOSIS — R2689 Other abnormalities of gait and mobility: Secondary | ICD-10-CM | POA: Diagnosis not present

## 2019-12-25 DIAGNOSIS — Z950 Presence of cardiac pacemaker: Secondary | ICD-10-CM | POA: Diagnosis not present

## 2019-12-25 DIAGNOSIS — M25561 Pain in right knee: Secondary | ICD-10-CM | POA: Diagnosis not present

## 2019-12-25 DIAGNOSIS — R279 Unspecified lack of coordination: Secondary | ICD-10-CM | POA: Diagnosis not present

## 2019-12-25 DIAGNOSIS — M6281 Muscle weakness (generalized): Secondary | ICD-10-CM | POA: Diagnosis not present

## 2019-12-26 ENCOUNTER — Telehealth: Payer: Self-pay

## 2019-12-26 NOTE — Telephone Encounter (Signed)
A Occupational Therapy OT Recertification/monthly summary was signed and faxed on 12/25/2019 @ 3:43 pm.  Confirmation given . Sakia Schrimpf,cma

## 2019-12-30 DIAGNOSIS — R2689 Other abnormalities of gait and mobility: Secondary | ICD-10-CM | POA: Diagnosis not present

## 2019-12-30 DIAGNOSIS — Z950 Presence of cardiac pacemaker: Secondary | ICD-10-CM | POA: Diagnosis not present

## 2019-12-30 DIAGNOSIS — M6281 Muscle weakness (generalized): Secondary | ICD-10-CM | POA: Diagnosis not present

## 2019-12-30 DIAGNOSIS — M25561 Pain in right knee: Secondary | ICD-10-CM | POA: Diagnosis not present

## 2019-12-30 DIAGNOSIS — M25512 Pain in left shoulder: Secondary | ICD-10-CM | POA: Diagnosis not present

## 2019-12-30 DIAGNOSIS — R279 Unspecified lack of coordination: Secondary | ICD-10-CM | POA: Diagnosis not present

## 2020-01-01 DIAGNOSIS — R2689 Other abnormalities of gait and mobility: Secondary | ICD-10-CM | POA: Diagnosis not present

## 2020-01-01 DIAGNOSIS — M25512 Pain in left shoulder: Secondary | ICD-10-CM | POA: Diagnosis not present

## 2020-01-01 DIAGNOSIS — R279 Unspecified lack of coordination: Secondary | ICD-10-CM | POA: Diagnosis not present

## 2020-01-01 DIAGNOSIS — M25561 Pain in right knee: Secondary | ICD-10-CM | POA: Diagnosis not present

## 2020-01-01 DIAGNOSIS — Z950 Presence of cardiac pacemaker: Secondary | ICD-10-CM | POA: Diagnosis not present

## 2020-01-01 DIAGNOSIS — M6281 Muscle weakness (generalized): Secondary | ICD-10-CM | POA: Diagnosis not present

## 2020-01-02 ENCOUNTER — Other Ambulatory Visit: Payer: Self-pay | Admitting: Obstetrics and Gynecology

## 2020-01-02 ENCOUNTER — Telehealth: Payer: Self-pay | Admitting: Obstetrics and Gynecology

## 2020-01-02 DIAGNOSIS — N83202 Unspecified ovarian cyst, left side: Secondary | ICD-10-CM

## 2020-01-02 DIAGNOSIS — N83299 Other ovarian cyst, unspecified side: Secondary | ICD-10-CM

## 2020-01-02 NOTE — Telephone Encounter (Signed)
Called and spoke with Alison Huffman. 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 It recently grew a small amount in size. We spoke at her visit in the office and again over the phone about surgical exploration to remove the ovary. Have recommended to the patient a TAHS, BSO. However, she is not inclined to have this procedure at this time because she is just starting to get out in the community more. She has elected for continued monitoring of the complex ovarian cyst with repeat US and CA125 in 3 months. She understands that the ovarian cyst could be a malignancy and she has previously met with gynecological oncology regarding this complex ovarian cyst. Will plan to repeat the [place] and labs in 3 months and will meet with Alison Huffman again in office.   Adrian Prows MD [facility] 01/02/2020 10:04 AM

## 2020-01-06 DIAGNOSIS — Z950 Presence of cardiac pacemaker: Secondary | ICD-10-CM | POA: Diagnosis not present

## 2020-01-06 DIAGNOSIS — M25561 Pain in right knee: Secondary | ICD-10-CM | POA: Diagnosis not present

## 2020-01-06 DIAGNOSIS — M6281 Muscle weakness (generalized): Secondary | ICD-10-CM | POA: Diagnosis not present

## 2020-01-06 DIAGNOSIS — R279 Unspecified lack of coordination: Secondary | ICD-10-CM | POA: Diagnosis not present

## 2020-01-06 DIAGNOSIS — M25512 Pain in left shoulder: Secondary | ICD-10-CM | POA: Diagnosis not present

## 2020-01-06 DIAGNOSIS — R2689 Other abnormalities of gait and mobility: Secondary | ICD-10-CM | POA: Diagnosis not present

## 2020-01-08 ENCOUNTER — Ambulatory Visit: Payer: Medicare Other

## 2020-01-08 DIAGNOSIS — M25561 Pain in right knee: Secondary | ICD-10-CM | POA: Diagnosis not present

## 2020-01-08 DIAGNOSIS — Z950 Presence of cardiac pacemaker: Secondary | ICD-10-CM | POA: Diagnosis not present

## 2020-01-08 DIAGNOSIS — M25512 Pain in left shoulder: Secondary | ICD-10-CM | POA: Diagnosis not present

## 2020-01-08 DIAGNOSIS — R2689 Other abnormalities of gait and mobility: Secondary | ICD-10-CM | POA: Diagnosis not present

## 2020-01-08 DIAGNOSIS — M6281 Muscle weakness (generalized): Secondary | ICD-10-CM | POA: Diagnosis not present

## 2020-01-08 DIAGNOSIS — R279 Unspecified lack of coordination: Secondary | ICD-10-CM | POA: Diagnosis not present

## 2020-01-13 DIAGNOSIS — M25561 Pain in right knee: Secondary | ICD-10-CM | POA: Diagnosis not present

## 2020-01-13 DIAGNOSIS — R279 Unspecified lack of coordination: Secondary | ICD-10-CM | POA: Diagnosis not present

## 2020-01-13 DIAGNOSIS — Z950 Presence of cardiac pacemaker: Secondary | ICD-10-CM | POA: Diagnosis not present

## 2020-01-13 DIAGNOSIS — M6281 Muscle weakness (generalized): Secondary | ICD-10-CM | POA: Diagnosis not present

## 2020-01-13 DIAGNOSIS — R2689 Other abnormalities of gait and mobility: Secondary | ICD-10-CM | POA: Diagnosis not present

## 2020-01-13 DIAGNOSIS — M25512 Pain in left shoulder: Secondary | ICD-10-CM | POA: Diagnosis not present

## 2020-01-14 ENCOUNTER — Ambulatory Visit (INDEPENDENT_AMBULATORY_CARE_PROVIDER_SITE_OTHER): Payer: Medicare Other | Admitting: *Deleted

## 2020-01-14 DIAGNOSIS — I441 Atrioventricular block, second degree: Secondary | ICD-10-CM | POA: Diagnosis not present

## 2020-01-15 DIAGNOSIS — R2689 Other abnormalities of gait and mobility: Secondary | ICD-10-CM | POA: Diagnosis not present

## 2020-01-15 DIAGNOSIS — M25512 Pain in left shoulder: Secondary | ICD-10-CM | POA: Diagnosis not present

## 2020-01-15 DIAGNOSIS — R279 Unspecified lack of coordination: Secondary | ICD-10-CM | POA: Diagnosis not present

## 2020-01-15 DIAGNOSIS — Z950 Presence of cardiac pacemaker: Secondary | ICD-10-CM | POA: Diagnosis not present

## 2020-01-15 DIAGNOSIS — M25561 Pain in right knee: Secondary | ICD-10-CM | POA: Diagnosis not present

## 2020-01-15 DIAGNOSIS — M6281 Muscle weakness (generalized): Secondary | ICD-10-CM | POA: Diagnosis not present

## 2020-01-15 LAB — CUP PACEART REMOTE DEVICE CHECK
Battery Remaining Longevity: 133 mo
Battery Remaining Percentage: 95.5 %
Battery Voltage: 3.01 V
Brady Statistic AP VP Percent: 2.3 %
Brady Statistic AP VS Percent: 11 %
Brady Statistic AS VP Percent: 29 %
Brady Statistic AS VS Percent: 56 %
Brady Statistic RA Percent Paced: 10 %
Brady Statistic RV Percent Paced: 31 %
Date Time Interrogation Session: 20210415041950
Implantable Lead Implant Date: 20191022
Implantable Lead Implant Date: 20191022
Implantable Lead Location: 753859
Implantable Lead Location: 753860
Implantable Pulse Generator Implant Date: 20191022
Lead Channel Impedance Value: 640 Ohm
Lead Channel Impedance Value: 740 Ohm
Lead Channel Pacing Threshold Amplitude: 0.5 V
Lead Channel Pacing Threshold Amplitude: 0.75 V
Lead Channel Pacing Threshold Pulse Width: 0.5 ms
Lead Channel Pacing Threshold Pulse Width: 0.5 ms
Lead Channel Sensing Intrinsic Amplitude: 12 mV
Lead Channel Sensing Intrinsic Amplitude: 4.2 mV
Lead Channel Setting Pacing Amplitude: 1 V
Lead Channel Setting Pacing Amplitude: 2 V
Lead Channel Setting Pacing Pulse Width: 0.5 ms
Lead Channel Setting Sensing Sensitivity: 2 mV
Pulse Gen Model: 2272
Pulse Gen Serial Number: 9073848

## 2020-01-15 NOTE — Progress Notes (Signed)
PPM Remote  

## 2020-01-20 DIAGNOSIS — M25561 Pain in right knee: Secondary | ICD-10-CM | POA: Diagnosis not present

## 2020-01-20 DIAGNOSIS — Z950 Presence of cardiac pacemaker: Secondary | ICD-10-CM | POA: Diagnosis not present

## 2020-01-20 DIAGNOSIS — M6281 Muscle weakness (generalized): Secondary | ICD-10-CM | POA: Diagnosis not present

## 2020-01-20 DIAGNOSIS — R2689 Other abnormalities of gait and mobility: Secondary | ICD-10-CM | POA: Diagnosis not present

## 2020-01-20 DIAGNOSIS — M25512 Pain in left shoulder: Secondary | ICD-10-CM | POA: Diagnosis not present

## 2020-01-20 DIAGNOSIS — R279 Unspecified lack of coordination: Secondary | ICD-10-CM | POA: Diagnosis not present

## 2020-01-22 DIAGNOSIS — M6281 Muscle weakness (generalized): Secondary | ICD-10-CM | POA: Diagnosis not present

## 2020-01-22 DIAGNOSIS — R2689 Other abnormalities of gait and mobility: Secondary | ICD-10-CM | POA: Diagnosis not present

## 2020-01-22 DIAGNOSIS — M25561 Pain in right knee: Secondary | ICD-10-CM | POA: Diagnosis not present

## 2020-01-22 DIAGNOSIS — R279 Unspecified lack of coordination: Secondary | ICD-10-CM | POA: Diagnosis not present

## 2020-01-22 DIAGNOSIS — Z950 Presence of cardiac pacemaker: Secondary | ICD-10-CM | POA: Diagnosis not present

## 2020-01-22 DIAGNOSIS — M25512 Pain in left shoulder: Secondary | ICD-10-CM | POA: Diagnosis not present

## 2020-01-27 DIAGNOSIS — M25512 Pain in left shoulder: Secondary | ICD-10-CM | POA: Diagnosis not present

## 2020-01-27 DIAGNOSIS — M25561 Pain in right knee: Secondary | ICD-10-CM | POA: Diagnosis not present

## 2020-01-27 DIAGNOSIS — M6281 Muscle weakness (generalized): Secondary | ICD-10-CM | POA: Diagnosis not present

## 2020-01-27 DIAGNOSIS — Z950 Presence of cardiac pacemaker: Secondary | ICD-10-CM | POA: Diagnosis not present

## 2020-01-27 DIAGNOSIS — R279 Unspecified lack of coordination: Secondary | ICD-10-CM | POA: Diagnosis not present

## 2020-01-27 DIAGNOSIS — R2689 Other abnormalities of gait and mobility: Secondary | ICD-10-CM | POA: Diagnosis not present

## 2020-01-29 DIAGNOSIS — M25512 Pain in left shoulder: Secondary | ICD-10-CM | POA: Diagnosis not present

## 2020-01-29 DIAGNOSIS — R279 Unspecified lack of coordination: Secondary | ICD-10-CM | POA: Diagnosis not present

## 2020-01-29 DIAGNOSIS — M6281 Muscle weakness (generalized): Secondary | ICD-10-CM | POA: Diagnosis not present

## 2020-01-29 DIAGNOSIS — M25561 Pain in right knee: Secondary | ICD-10-CM | POA: Diagnosis not present

## 2020-01-29 DIAGNOSIS — Z950 Presence of cardiac pacemaker: Secondary | ICD-10-CM | POA: Diagnosis not present

## 2020-01-29 DIAGNOSIS — R2689 Other abnormalities of gait and mobility: Secondary | ICD-10-CM | POA: Diagnosis not present

## 2020-02-02 DIAGNOSIS — M6281 Muscle weakness (generalized): Secondary | ICD-10-CM | POA: Diagnosis not present

## 2020-02-02 DIAGNOSIS — R2689 Other abnormalities of gait and mobility: Secondary | ICD-10-CM | POA: Diagnosis not present

## 2020-02-02 DIAGNOSIS — M25561 Pain in right knee: Secondary | ICD-10-CM | POA: Diagnosis not present

## 2020-02-02 DIAGNOSIS — R279 Unspecified lack of coordination: Secondary | ICD-10-CM | POA: Diagnosis not present

## 2020-02-02 DIAGNOSIS — M25512 Pain in left shoulder: Secondary | ICD-10-CM | POA: Diagnosis not present

## 2020-02-02 DIAGNOSIS — Z950 Presence of cardiac pacemaker: Secondary | ICD-10-CM | POA: Diagnosis not present

## 2020-02-10 DIAGNOSIS — Z79899 Other long term (current) drug therapy: Secondary | ICD-10-CM | POA: Diagnosis not present

## 2020-02-10 DIAGNOSIS — L4050 Arthropathic psoriasis, unspecified: Secondary | ICD-10-CM | POA: Diagnosis not present

## 2020-02-10 DIAGNOSIS — L409 Psoriasis, unspecified: Secondary | ICD-10-CM | POA: Diagnosis not present

## 2020-02-11 DIAGNOSIS — K219 Gastro-esophageal reflux disease without esophagitis: Secondary | ICD-10-CM | POA: Diagnosis not present

## 2020-02-11 DIAGNOSIS — K449 Diaphragmatic hernia without obstruction or gangrene: Secondary | ICD-10-CM | POA: Diagnosis not present

## 2020-02-11 DIAGNOSIS — K2970 Gastritis, unspecified, without bleeding: Secondary | ICD-10-CM | POA: Diagnosis not present

## 2020-02-11 DIAGNOSIS — K5909 Other constipation: Secondary | ICD-10-CM | POA: Diagnosis not present

## 2020-03-04 ENCOUNTER — Other Ambulatory Visit: Payer: Medicare Other

## 2020-03-04 ENCOUNTER — Ambulatory Visit
Admission: RE | Admit: 2020-03-04 | Discharge: 2020-03-04 | Disposition: A | Discharge status: Home / Self Care | Payer: Medicare Other | Source: Ambulatory Visit | Attending: Obstetrics and Gynecology | Admitting: Obstetrics and Gynecology

## 2020-03-04 ENCOUNTER — Other Ambulatory Visit: Payer: Self-pay

## 2020-03-04 DIAGNOSIS — N83299 Other ovarian cyst, unspecified side: Secondary | ICD-10-CM

## 2020-03-04 DIAGNOSIS — N83202 Unspecified ovarian cyst, left side: Secondary | ICD-10-CM | POA: Insufficient documentation

## 2020-03-04 DIAGNOSIS — N838 Other noninflammatory disorders of ovary, fallopian tube and broad ligament: Secondary | ICD-10-CM | POA: Diagnosis not present

## 2020-03-05 LAB — OVARIAN MALIGNANCY RISK-ROMA
Cancer Antigen (CA) 125: 4 U/mL (ref 0.0–38.1)
HE4: 78.1 pmol/L (ref 0.0–96.9)
Postmenopausal ROMA: 0.73
Premenopausal ROMA: 1.76 — ABNORMAL HIGH

## 2020-03-05 LAB — PREMENOPAUSAL INTERP: HIGH

## 2020-03-05 LAB — POSTMENOPAUSAL INTERP: LOW

## 2020-03-08 DIAGNOSIS — L4050 Arthropathic psoriasis, unspecified: Secondary | ICD-10-CM | POA: Diagnosis not present

## 2020-03-09 ENCOUNTER — Ambulatory Visit (INDEPENDENT_AMBULATORY_CARE_PROVIDER_SITE_OTHER): Payer: Medicare Other | Admitting: Obstetrics and Gynecology

## 2020-03-09 ENCOUNTER — Other Ambulatory Visit: Payer: Self-pay

## 2020-03-09 ENCOUNTER — Encounter: Payer: Self-pay | Admitting: Obstetrics and Gynecology

## 2020-03-09 VITALS — BP 130/70 | Ht 66.0 in | Wt 169.0 lb

## 2020-03-09 DIAGNOSIS — R9389 Abnormal findings on diagnostic imaging of other specified body structures: Secondary | ICD-10-CM

## 2020-03-09 DIAGNOSIS — N83299 Other ovarian cyst, unspecified side: Secondary | ICD-10-CM

## 2020-03-09 DIAGNOSIS — N83202 Unspecified ovarian cyst, left side: Secondary | ICD-10-CM

## 2020-03-09 NOTE — Patient Instructions (Signed)
Hysterectomy Information  A hysterectomy is a surgery in which the uterus is removed. The fallopian tubes and ovaries may be removed (bilateral salpingo-oophorectomy) as well. This procedure may be done to treat various medical problems. After the procedure, a woman will no longer have menstrual periods nor will she be able to become pregnant (sterile). What are the reasons for a hysterectomy? There are many reasons why a woman might have this procedure. They include:  Persistent, abnormal vaginal bleeding.  Long-term (chronic) pelvic pain or infection.  Endometriosis. This is when the lining of the uterus (endometrium) starts to grow outside the uterus.  Adenomyosis. This is when the endometrium starts to grow in the muscle of the uterus.  Pelvic organ prolapse. This is a condition in which the uterus falls down into the vagina.  Noncancerous growths in the uterus (uterine fibroids) that cause symptoms.  The presence of precancerous cells.  Cervical or uterine cancer. What are the different types of hysterectomy? There are three different types of hysterectomy:  Supracervical hysterectomy. In this type, the top part of the uterus is removed, but not the cervix.  Total hysterectomy. In this type, the uterus and cervix are removed.  Radical hysterectomy. In this type, the uterus, the cervix, and the tissue that holds the uterus in place (parametrium) are removed. What are the different ways a hysterectomy can be performed? There are many different ways a hysterectomy can be performed, including:  Abdominal hysterectomy. In this type, an incision is made in the abdomen. The uterus is removed through this incision.  Vaginal hysterectomy. In this type, an incision is made in the vagina. The uterus is removed through this incision. There are no abdominal incisions.  Conventional laparoscopic hysterectomy. In this type, three or four small incisions are made in the abdomen. A thin,  lighted tube with a camera (laparoscope) is inserted into one of the incisions. Other tools are put through the other incisions. The uterus is cut into small pieces. The small pieces are removed through the incisions or through the vagina.  Laparoscopically assisted vaginal hysterectomy (LAVH). In this type, three or four small incisions are made in the abdomen. Part of the surgery is performed laparoscopically and the other part is done vaginally. The uterus is removed through the vagina.  Robot-assisted laparoscopic hysterectomy. In this type, a laparoscope and other tools are inserted into three or four small incisions in the abdomen. A computer-controlled device is used to give the surgeon a 3D image and to help control the surgical instruments. This allows for more precise movements of surgical instruments. The uterus is cut into small pieces and removed through the incisions or removed through the vagina. Discuss the options with your health care provider to determine which type is the right one for you. What are the risks? Generally, this is a safe procedure. However, problems may occur, including:  Bleeding and risk of blood transfusion. Tell your health care provider if you do not want to receive any blood products.  Blood clots in the legs or lung.  Infection.  Damage to other structures or organs.  Allergic reactions to medicines.  Changing to an abdominal hysterectomy from one of the other techniques. What to expect after a hysterectomy  You will be given pain medicine.  You may need to stay in the hospital for 1- 2 days to recover, depending on the type of hysterectomy you had.  Follow your health care provider's instructions about exercise, driving, and general activities. Ask your   health care provider what activities are safe for you.  You will need to have someone with you for the first 3-5 days after you go home.  You will need to follow up with your surgeon in 2-4  weeks after surgery to evaluate your progress.  If the ovaries are removed, you will have early menopause symptoms such as hot flashes, night sweats, and insomnia.  If you had a hysterectomy for a problem that was not cancer or not a condition that could lead to cancer, then you no longer need Pap tests. However, even if you no longer need a Pap test, a regular pelvic exam is a good idea to make sure no other problems are developing. Questions to ask your health care provider  Is a hysterectomy medically necessary? Do I have other treatment options for my condition?  What are my options for hysterectomy procedure?  What organs and tissues need to be removed?  What are the risks?  What are the benefits?  How long will I need to stay in the hospital after the procedure?  How long will I need to recover at home?  What symptoms can I expect after the procedure? Summary  A hysterectomy is a surgery in which the uterus is removed. The fallopian tubes and ovaries may be removed (bilateral salpingo-oophorectomy) as well.  This procedure may be done to treat various medical problems. After the procedure, a woman will no longer have menstrual periods nor will she be able to become pregnant.  Discuss the options with your health care provider to determine which type of hysterectomy is the right one for you. This information is not intended to replace advice given to you by your health care provider. Make sure you discuss any questions you have with your health care provider. Document Revised: 08/31/2017 Document Reviewed: 10/25/2016 Elsevier Patient Education  2020 [employer]  

## 2020-03-09 NOTE — Progress Notes (Signed)
Patient ID: Alison Huffman, female   DOB: 1940-11-20, 79 y.o.   MRN: [id number]  Reason for Consult: Gynecologic Exam   Referred by Leone Haven, MD  Subjective:     HPI:  Alison Huffman is a 79 y.o. female she is following up today for ongoing surveillance of a complex left ovarian cyst and thickened endometrium.  She denies new symptoms or complaints. She denies vaginal bleeding. She denies changes to bowel movements. She reports she did have some constipation which she believes was secondary to having diverticulitis and not following a proper diet. Constipation resolved when taking a stool softner. She denies nause, vomiting, diarrhea, and weight changes. I have recommended surgery and a hysterectomy before because of the complex nature of the ovarian cyst. She has been hesitant to have this surgery during the pandemic and had opted for monitoring. She recognizes that the ovari an cyst has not resolved and she feels now ready to move forward with planning for surgery. The earliest she would want to have the procedure is the second week of August.  She notes she is currently taking Enbrel weekly injections for arthritis  [place] 03/04/2020 12/11/2019 10/07/2019 07/02/2019 12/11/2018  Past Medical History:  Diagnosis Date  . Ankle pain, right   . Atypical chest pain    a. 10/2018 MV: EF 57%. No ischemia/infarct.  . Coronary artery disease   . Diastolic dysfunction    a. 04/2018 Echo: EF 55-60%, no rwma, Gr1 DD. Triv AI, mild MR. Mod dil LA. Nl RV fxn. PASP nl.  . GERD (gastroesophageal reflux disease)   . Headache    "dull one sometimes daily, at least weekly in last couple months" (07/22/2018)  . Hyperlipidemia   . Hypertension   . 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 Joint pain in fingers of right hand   . Lichen sclerosus   . Osteopenia   . Pneumonia 2012? X 1  . 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 Psoriasis   . Psoriatic arthritis ([place])    " dx'd the 1st of  this year, 2019" (07/22/2018)  . Psoriatic arthritis ([place])   . Rosacea   . Seborrheic keratosis   . Second degree heart block    a. 07/2018 s/p SJM Assurity MRI model [id number] (Ser# [id number]).  . Sleep apnea    "using nasal pilllows since ~ 12/2017" (07/22/2018)  . TMJ (dislocation of temporomandibular joint)    Family History  Problem Relation Age of Onset  . Stroke Mother   . Atrial fibrillation Mother   . Breast cancer Mother   . Stroke Father   . Breast cancer Maternal Aunt   . Breast cancer Paternal Aunt    Past Surgical History:  Procedure Laterality Date  . APPENDECTOMY    . BREAST CYST ASPIRATION Bilateral   . BREAST CYST EXCISION Right 1978   benign  . COLONOSCOPY    . COLONOSCOPY WITH PROPOFOL N/A 07/10/2019   Procedure: COLONOSCOPY WITH PROPOFOL;  Surgeon: Lollie Sails, MD;  Location[facility] ENDOSCOPY;  Service: Endoscopy;  Laterality: N/A;  . ESOPHAGOGASTRODUODENOSCOPY (EGD) WITH PROPOFOL N/A 07/10/2019   Procedure: ESOPHAGOGASTRODUODENOSCOPY (EGD) WITH PROPOFOL;  Surgeon: Lollie Sails, MD;  Location: [facility] ENDOSCOPY;  Service: Endoscopy;  Laterality: N/A;  . HYSTEROSCOPY WITH D & C N/A 11/05/2018   Procedure: DILATATION AND CURETTAGE /HYSTEROSCOPY;  Surgeon: Homero Fellers, MD;  Location: [facility] ORS;  Service: Gynecology;  Laterality: N/A;  . INSERT / REPLACE / REMOVE PACEMAKER    .  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 UTERINE POLYPS REMOVAL      Short Social History:  Social History   Tobacco Use  . Smoking status: Former Smoker    Types: Cigarettes  . Smokeless tobacco: Never Used  . Tobacco comment: 07/22/2018 "smoked some in college; 1960s;  nothing since"  Substance Use Topics  . Alcohol use: Yes    Comment: rarely    No Known Allergies  Current Outpatient Medications  Medication Sig Dispense Refill  . acetaminophen (TYLENOL) 325 MG tablet Take 325-650 mg by  mouth every 6 (six) hours as needed for mild pain or headache.     . Calcium Citrate (CITRACAL PO) Take 1 Dose by mouth daily.    . Cholecalciferol (VITAMIN D3) 50 MCG (2000 UT) TABS Take 2,000 Units by mouth daily.    . Clobetasol Prop Emollient Base (CLOBETASOL PROPIONATE E) 0.05 % emollient cream Apply 1 application topically every Sunday.     [name] desonide (DESOWEN) 0.05 % ointment Apply 1 application topically 2 (two) times daily as needed (for psoriasis).     Scarlette Shorts SURECLICK 50 MG/ML injection Inject into the skin once a week.    . famotidine (PEPCID) 20 MG tablet Take 20 mg by mouth 2 (two) times daily.    . Methylcellulose, Laxative, (CITRUCEL PO) Take 1 Dose by mouth.    . metroNIDAZOLE (METROCREAM) 0.75 % cream Apply topically 2 (two) times daily as needed (facial redness/irritation.).     [name] nystatin cream (MYCOSTATIN) Apply 1 application topically 2 (two) times daily as needed for dry skin.     . rosuvastatin (CRESTOR) 5 MG tablet Take 1 tablet (5 mg total) by mouth daily. 90 tablet 3  . tacrolimus (PROTOPIC) 0.1 % ointment Apply topically 2 (two) times daily as needed (for psoriasis).     . valsartan (DIOVAN) 80 MG tablet TAKE 1 TABLET DAILY 90 tablet 3   No current facility-administered medications for this visit.    Review of Systems  Constitutional: Negative for chills, fatigue, fever and unexpected weight change.  HENT: Negative for trouble swallowing.  Eyes: Negative for loss of vision.  Respiratory: Negative for cough, shortness of breath and wheezing.  Cardiovascular: Negative for chest pain, leg swelling, palpitations and syncope.  GI: Negative for abdominal pain, blood in stool, diarrhea, nausea and vomiting.  GU: Negative for difficulty urinating, dysuria, frequency and hematuria.  Musculoskeletal: Negative for back pain, leg pain and joint pain.  Skin: Negative for rash.  Neurological: Negative for dizziness, headaches, light-headedness, numbness and seizures.   Psychiatric: Negative for behavioral problem, confusion, depressed mood and sleep disturbance.        Objective:  Objective   Vitals:   03/09/20 1104  BP: 130/70  Weight: 169 lb (76.7 kg)  Height: 5\' 6"  (1.676 m)   Body mass index is 27.28 kg/m.  Physical Exam Vitals and nursing note reviewed.  Constitutional:      Appearance: She is well-developed.  HENT:     Head: Normocephalic and atraumatic.  Eyes:     Pupils: Pupils are equal, round, and reactive to light.  Cardiovascular:     Rate and Rhythm: Normal rate and regular rhythm.  Pulmonary:     Effort: Pulmonary effort is normal. No respiratory distress.  Skin:    General: Skin is warm and dry.  Neurological:     Mental Status: She is alert and  oriented to person, place, and time.  Psychiatric:        Behavior: Behavior normal.        Thought Content: Thought content normal.        Judgment: Judgment normal.        Assessment/Plan:     79 yo with complex ovarian cyst and thickened endometrium Persistent ovarian cyst, desires surgical intervention .  Referral to Gynecological oncology for consultation, possible co-boarding of surgery.     Adrian Prows MD [facility] 03/09/2020 11:44 AM

## 2020-03-11 ENCOUNTER — Telehealth: Payer: Self-pay

## 2020-03-11 NOTE — Telephone Encounter (Signed)
Called and left voicemail to return call for appointment with gyn oncology at the request of Dr. Gilman Schmidt.

## 2020-03-12 ENCOUNTER — Telehealth: Payer: Self-pay

## 2020-03-12 NOTE — Telephone Encounter (Signed)
Spoke with Alison Huffman.Gyn oncology appointment scheduled.

## 2020-03-24 ENCOUNTER — Other Ambulatory Visit: Payer: Self-pay

## 2020-03-24 ENCOUNTER — Encounter: Payer: Self-pay | Admitting: Family Medicine

## 2020-03-24 ENCOUNTER — Ambulatory Visit (INDEPENDENT_AMBULATORY_CARE_PROVIDER_SITE_OTHER): Payer: Medicare Other | Admitting: Family Medicine

## 2020-03-24 ENCOUNTER — Ambulatory Visit: Payer: Medicare Other

## 2020-03-24 VITALS — BP 130/80 | HR 68 | Temp 97.8°F | Ht 66.0 in | Wt 170.2 lb

## 2020-03-24 DIAGNOSIS — R200 Anesthesia of skin: Secondary | ICD-10-CM | POA: Insufficient documentation

## 2020-03-24 DIAGNOSIS — R7303 Prediabetes: Secondary | ICD-10-CM | POA: Diagnosis not present

## 2020-03-24 DIAGNOSIS — R202 Paresthesia of skin: Secondary | ICD-10-CM | POA: Diagnosis not present

## 2020-03-24 NOTE — Assessment & Plan Note (Addendum)
I suspect this could be related to the Enbrel given the time course.  Symptoms are consistent with neuropathy.  She does have prediabetes which could contribute.  She is overall neurologically intact without any weakness or sensory deficit to light touch.  Lack of weakness and lack of a specific sensory level would argue against Guillain-Barr and transverse myelitis which are listed as potential issues with Enbrel though would seem very unlikely given the patient's exam.  She will see how she does off of the Enbrel and she will follow-up with her rheumatologist regarding that.  We will check lab work to evaluate for other potential causes.  Advised that if she has any spreading of her symptoms or she develops any weakness she needs to be evaluated immediately to consider imaging.

## 2020-03-24 NOTE — Patient Instructions (Signed)
Nice to see you. We will get lab work today to evaluate for other causes. Please follow-up with your rheumatologist regarding the Enbrel. If you have spreading symptoms, worsening symptoms, or you develop weakness or new symptoms please seek medical attention immediately.

## 2020-03-24 NOTE — Progress Notes (Addendum)
Tommi Rumps, MD Phone: [phone]  Alison Huffman is a 79 y.o. female who presents today for same day visit.  Tingling: Patient notes onset of tingling in her feet and lower legs last week and then over the last several days into her fingers bilaterally.  She started on Enbrel several weeks ago and had no issues after the first dose that the symptoms started after the second dose.  She spoke with the rheumatologist and they advised her to skip the dose this week and then contact them next week when she is due for her next dose.  She notes a little tightness and feels as though her feet are swollen though when she looks at them they do not appear swollen.  She notes no weakness.  No incontinence.  No fevers.  Notes the symptoms in her fingers are intermittent.  Social History   Tobacco Use  Smoking Status Former Smoker  . Types: Cigarettes  Smokeless Tobacco Never Used  Tobacco Comment   07/22/2018 "smoked some in college; 1960s;  nothing since"     ROS see history of present illness  Objective  Physical Exam Vitals:   03/24/20 1406  BP: 130/80  Pulse: 68  Temp: 97.8 F (36.6 C)  SpO2: 98%    BP Readings from Last 3 Encounters:  03/24/20 130/80  03/09/20 130/70  12/12/19 128/80   Wt Readings from Last 3 Encounters:  03/24/20 170 lb 3.2 oz (77.2 kg)  03/09/20 169 lb (76.7 kg)  12/12/19 170 lb (77.1 kg)    Physical Exam Constitutional:      General: She is not in acute distress.    Appearance: She is not diaphoretic.  Cardiovascular:     Rate and Rhythm: Normal rate and regular rhythm.     Heart sounds: Normal heart sounds.  Pulmonary:     Effort: Pulmonary effort is normal.     Breath sounds: Normal breath sounds.  Skin:    General: Skin is warm and dry.  Neurological:     Mental Status: She is alert.     Comments: EOMI, PERRL, facial sensation intact bilaterally, hearing intact to finger rub, closes eyes and opens them adequately, shoulder shrug  intact, 5/5 strength in bilateral biceps, triceps, grip, quads, hamstrings, plantar and dorsiflexion, sensation to light touch intact in bilateral UE and LE, normal gait, 2+ biceps and brachioradialis reflexes, absent patellar reflexes, no clonus on ankle jerk testing, monofilament testing intact bilateral feet      Assessment/Plan: Please see individual problem list.  Tingling in extremities I suspect this could be related to the Enbrel given the time course.  Symptoms are consistent with neuropathy.  She does have prediabetes which could contribute.  She is overall neurologically intact without any weakness or sensory deficit to light touch.  Lack of weakness and lack of a specific sensory level would argue against Guillain-Barr and transverse myelitis which are listed as potential issues with Enbrel though would seem very unlikely given the patient's exam.  She will see how she does off of the Enbrel and she will follow-up with her rheumatologist regarding that.  We will check lab work to evaluate for other potential causes.  Advised that if she has any spreading of her symptoms or she develops any weakness she needs to be evaluated immediately to consider imaging.   Orders Placed This Encounter  Procedures  . B12  . HgB A1c  . Comp Met (CMET)  . TSH    No orders of  the defined types were placed in this encounter.   This visit occurred during the SARS-CoV-2 public health emergency.  Safety protocols were in place, including screening questions prior to the visit, additional usage of staff PPE, and extensive cleaning of exam room while observing appropriate contact time as indicated for disinfecting solutions.    Tommi Rumps, MD [place]

## 2020-03-25 ENCOUNTER — Emergency Department
Admission: EM | Admit: 2020-03-25 | Discharge: 2020-03-25 | Disposition: A | Discharge status: Home / Self Care | Payer: Medicare Other | Source: Home / Self Care | Attending: Emergency Medicine | Admitting: Emergency Medicine

## 2020-03-25 ENCOUNTER — Other Ambulatory Visit: Payer: Self-pay

## 2020-03-25 DIAGNOSIS — R202 Paresthesia of skin: Secondary | ICD-10-CM

## 2020-03-25 DIAGNOSIS — E119 Type 2 diabetes mellitus without complications: Secondary | ICD-10-CM | POA: Insufficient documentation

## 2020-03-25 DIAGNOSIS — E785 Hyperlipidemia, unspecified: Secondary | ICD-10-CM | POA: Insufficient documentation

## 2020-03-25 DIAGNOSIS — Z87891 Personal history of nicotine dependence: Secondary | ICD-10-CM | POA: Insufficient documentation

## 2020-03-25 DIAGNOSIS — Z7984 Long term (current) use of oral hypoglycemic drugs: Secondary | ICD-10-CM | POA: Insufficient documentation

## 2020-03-25 DIAGNOSIS — Z79899 Other long term (current) drug therapy: Secondary | ICD-10-CM | POA: Insufficient documentation

## 2020-03-25 DIAGNOSIS — I10 Essential (primary) hypertension: Secondary | ICD-10-CM | POA: Insufficient documentation

## 2020-03-25 LAB — COMPREHENSIVE METABOLIC PANEL
ALT: 13 U/L (ref 0–35)
ALT: 15 U/L (ref 0–44)
AST: 15 U/L (ref 0–37)
AST: 16 U/L (ref 15–41)
Albumin: 4.1 g/dL (ref 3.5–5.2)
Albumin: 3.7 g/dL (ref 3.5–5.0)
Alkaline Phosphatase: 87 U/L (ref 39–117)
Alkaline Phosphatase: 78 U/L (ref 38–126)
Anion gap: 7 (ref 5–15)
BUN: 11 mg/dL (ref 6–23)
BUN: 10 mg/dL (ref 8–23)
CO2: 28 mEq/L (ref 19–32)
CO2: 28 mmol/L (ref 22–32)
Calcium: 9.7 mg/dL (ref 8.4–10.5)
Calcium: 9.2 mg/dL (ref 8.9–10.3)
Chloride: 103 mEq/L (ref 96–112)
Chloride: 106 mmol/L (ref 98–111)
Creatinine, Ser: 0.59 mg/dL (ref 0.40–1.20)
Creatinine, Ser: 0.61 mg/dL (ref 0.44–1.00)
GFR calc Af Amer: >60 mL/min (ref >60)
GFR calc non Af Amer: >60 mL/min (ref >60)
GFR: 97.98 mL/min (ref >60.00)
Glucose, Bld: 78 mg/dL (ref 70–99)
Glucose, Bld: 99 mg/dL (ref 70–99)
Potassium: 4.5 mEq/L (ref 3.5–5.1)
Potassium: 4.2 mmol/L (ref 3.5–5.1)
Sodium: 139 mEq/L (ref 135–145)
Sodium: 141 mmol/L (ref 135–145)
Total Bilirubin: 0.6 mg/dL (ref 0.2–1.2)
Total Bilirubin: 1 mg/dL (ref 0.3–1.2)
Total Protein: 7 g/dL (ref 6.0–8.3)
Total Protein: 7.2 g/dL (ref 6.5–8.1)

## 2020-03-25 LAB — CBC WITH DIFFERENTIAL/PLATELET
Abs Immature Granulocytes: 0.01 10*3/uL (ref 0.00–0.07)
Basophils Absolute: 0 10*3/uL (ref 0.0–0.1)
Basophils Relative: 0 %
Eosinophils Absolute: 0.1 10*3/uL (ref 0.0–0.5)
Eosinophils Relative: 2 %
HCT: 40.8 % (ref 36.0–46.0)
Hemoglobin: 13.5 g/dL (ref 12.0–15.0)
Immature Granulocytes: 0 %
Lymphocytes Relative: 24 %
Lymphs Abs: 1.3 10*3/uL (ref 0.7–4.0)
MCH: 30.5 pg (ref 26.0–34.0)
MCHC: 33.1 g/dL (ref 30.0–36.0)
MCV: 92.1 fL (ref 80.0–100.0)
Monocytes Absolute: 0.3 10*3/uL (ref 0.1–1.0)
Monocytes Relative: 5 %
Neutro Abs: 3.8 10*3/uL (ref 1.7–7.7)
Neutrophils Relative %: 69 %
Platelets: 228 10*3/uL (ref 150–400)
RBC: 4.43 MIL/uL (ref 3.87–5.11)
RDW: 12.9 % (ref 11.5–15.5)
WBC: 5.4 10*3/uL (ref 4.0–10.5)
nRBC: 0 % (ref 0.0–0.2)

## 2020-03-25 LAB — HEMOGLOBIN A1C: Hgb A1c MFr Bld: 6.1 % (ref 4.6–6.5)

## 2020-03-25 LAB — LIPASE, BLOOD: Lipase: 26 U/L (ref 11–51)

## 2020-03-25 LAB — VITAMIN B12: Vitamin B-12: 304 pg/mL (ref 211–911)

## 2020-03-25 LAB — TROPONIN I (HIGH SENSITIVITY): Troponin I (High Sensitivity): 7 ng/L (ref <18)

## 2020-03-25 LAB — TSH: TSH: 1.32 u[IU]/mL (ref 0.35–4.50)

## 2020-03-25 NOTE — ED Notes (Signed)
IV removed.

## 2020-03-25 NOTE — ED Notes (Signed)
Pt ambulated unassisted to toilet to void - pt assisted herself off and on stretcher

## 2020-03-25 NOTE — ED Triage Notes (Signed)
PT HAS MULTIPLE COMPLAINTS  C/O constipation with abd distention - Relieved some with BM  Back pain between shoulder blades since last pm   Reports that she has arthritis and started Embrel injection last week - has had 2 injections - Pt called rheumatologist and they said to skip next injection - Since then she has had numbness in bilat feet that progressed to the knee - Abd numbness - And yesterday numbness /tingling in hands  Pt reports swelling to bilat lower ext - None noted at this time   Pt was able to walk to EMS stretcher and in ED room she stood /walked/transfered herself onto stretcher   Pt saw PCP yesterday and was dx with neuropathy

## 2020-03-25 NOTE — ED Provider Notes (Signed)
[facility] Emergency Department Provider Note ____________________________________________   First MD Initiated Contact with Patient 03/25/20 0848     (approximate)  I have reviewed the triage vital signs and the nursing notes.   HISTORY  Chief Complaint Numbness    HPI Alison Huffman is a 79 y.o. female with PMH as noted below including a history of psoriatic arthritis who presents with numbness and tingling to all 4 extremities, initially in the feet and then spreading up the legs, and occurring over the last week.  The patient has also had it in her hands and fingertips.  The patient states that she was on Enbrel for her arthritis and received 2 doses, the most recent one last week.  After she developed the symptoms the decision was made to hold it this week.  She was seen by her primary doctor yesterday and had some labs sent.  She felt that the numbness started to go up her legs and was told to come to the ER if this happened.  The patient also reports a numb feeling or altered sensation in her abdomen as well as some tightness around her upper abdomen that have been present for the last day.  Past Medical History:  Diagnosis Date  . Ankle pain, right   . Atypical chest pain    a. 10/2018 MV: EF 57%. No ischemia/infarct.  . Coronary artery disease   . Diastolic dysfunction    a. 04/2018 Echo: EF 55-60%, no rwma, Gr1 DD. Triv AI, mild MR. Mod dil LA. Nl RV fxn. PASP nl.  . GERD (gastroesophageal reflux disease)   . Headache    "dull one sometimes daily, at least weekly in last couple months" (07/22/2018)  . Hyperlipidemia   . Hypertension   . 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 Joint pain in fingers of right hand   . Lichen sclerosus   . Osteopenia   . Pneumonia 2012? X 1  . 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 Psoriasis   . Psoriatic arthritis ([place])    " dx'd the 1st of this year, 2019" (07/22/2018)  . Psoriatic arthritis ([place])     . Rosacea   . Seborrheic keratosis   . Second degree heart block    a. 07/2018 s/p SJM Assurity MRI model [id number] (Ser# [id number]).  . Sleep apnea    "using nasal pilllows since ~ 12/2017" (07/22/2018)  . TMJ (dislocation of temporomandibular joint)     Patient Active Problem List   Diagnosis Date Noted  . Tingling in extremities 03/24/2020  . Inversion of right nipple 10/01/2019  . Breast cancer screening 09/22/2019  . Constipation 03/24/2019  . Cyst of left ovary 01/15/2019  . History of gastroesophageal reflux (GERD) 12/12/2018  . Postmenopausal bleeding 09/30/2018  . Thickened endometrium 09/30/2018  . Bilateral shoulder pain 09/20/2018  . Bradycardia 07/22/2018  . Second degree AV block, Mobitz type II 07/22/2018  . Exertional dyspnea 03/21/2018  . Greater trochanteric bursitis of both hips 12/18/2017  . Encounter for long-term (current) use of high-risk medication 12/10/2017  . Psoriasis 09/27/2017  . Prediabetes 09/19/2017  . Stiffness of right hand joint 08/28/2017  . Leg swelling 08/28/2017  . 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 Allergic rhinitis 06/14/2017  . Ankle pain, right 06/14/2017  . History of carotid stenosis 06/14/2017  . Psoriatic arthritis ([place]) 06/14/2017  . Lichen sclerosus   . GERD (gastroesophageal reflux disease)   . Hypertension 03/02/2017  . Hyperlipidemia 03/02/2017  .  OSA (obstructive sleep apnea) 03/02/2017  . Eye exam, routine 03/02/2017  . Osteoporosis 03/02/2017    Past Surgical History:  Procedure Laterality Date  . APPENDECTOMY    . BREAST CYST ASPIRATION Bilateral   . BREAST CYST EXCISION Right 1978   benign  . COLONOSCOPY    . COLONOSCOPY WITH PROPOFOL N/A 07/10/2019   Procedure: COLONOSCOPY WITH PROPOFOL;  Surgeon: Lollie Sails, MD;  Location: [facility] ENDOSCOPY;  Service: Endoscopy;  Laterality: N/A;  . ESOPHAGOGASTRODUODENOSCOPY (EGD) WITH PROPOFOL N/A 07/10/2019   Procedure: ESOPHAGOGASTRODUODENOSCOPY (EGD) WITH PROPOFOL;   Surgeon: Lollie Sails, MD;  Location: [facility] ENDOSCOPY;  Service: Endoscopy;  Laterality: N/A;  . HYSTEROSCOPY WITH D & C N/A 11/05/2018   Procedure: DILATATION AND CURETTAGE /HYSTEROSCOPY;  Surgeon: Homero Fellers, MD;  Location: [facility] ORS;  Service: Gynecology;  Laterality: N/A;  . INSERT / REPLACE / REMOVE PACEMAKER    . 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 UTERINE POLYPS REMOVAL      Prior to Admission medications   Medication Sig Start Date End Date Taking? Authorizing Provider  acetaminophen (TYLENOL) 325 MG tablet Take 325-650 mg by mouth every 6 (six) hours as needed for mild pain or headache.     [provider]  Calcium Citrate (CITRACAL PO) Take 1 Dose by mouth daily.    [provider]  Cholecalciferol (VITAMIN D3) 50 MCG (2000 UT) TABS Take 2,000 Units by mouth daily.    [provider]  Clobetasol Prop Emollient Base (CLOBETASOL PROPIONATE E) 0.05 % emollient cream Apply 1 application topically every Sunday.     [provider]  desonide (DESOWEN) 0.05 % ointment Apply 1 application topically 2 (two) times daily as needed (for psoriasis).     [provider]  ENBREL SURECLICK 50 MG/ML injection Inject into the skin once a week. Patient not taking: Reported on 03/24/2020 02/19/20   [provider]  famotidine (PEPCID) 20 MG tablet Take 20 mg by mouth 2 (two) times daily.    [provider]  Methylcellulose, Laxative, (CITRUCEL PO) Take 1 Dose by mouth.    [provider]  metroNIDAZOLE (METROCREAM) 0.75 % cream Apply topically 2 (two) times daily as needed (facial redness/irritation.).     [provider]  nystatin cream (MYCOSTATIN) Apply 1 application topically 2 (two) times daily as needed for dry skin.     [provider]  rosuvastatin (CRESTOR) 5 MG tablet Take 1 tablet (5 mg total) by mouth daily.  04/29/19 07/28/19  Minna Merritts, MD  tacrolimus (PROTOPIC) 0.1 % ointment Apply topically 2 (two) times daily as needed (for psoriasis).     [provider]  valsartan (DIOVAN) 80 MG tablet TAKE 1 TABLET DAILY 11/05/19   Leone Haven, MD    Allergies Nickel  Family History  Problem Relation Age of Onset  . Stroke Mother   . Atrial fibrillation Mother   . Breast cancer Mother   . Stroke Father   . Breast cancer Maternal Aunt   . Breast cancer Paternal Aunt     Social History Social History   Tobacco Use  . Smoking status: Former Smoker    Types: Cigarettes  . Smokeless tobacco: Never Used  . Tobacco comment: 07/22/2018 "smoked some in college; 1960s;  nothing since"  Vaping Use  . Vaping Use: Never used  Substance Use Topics  . Alcohol use: Yes  Comment: rarely  . Drug use: Never    Review of Systems  Constitutional: No fever/chills. Eyes: No visual changes. ENT: No sore throat. Cardiovascular: Denies chest pain. Respiratory: Denies shortness of breath. Gastrointestinal: No vomiting or diarrhea.  Positive for upper abdominal pain. Genitourinary: Negative for dysuria.  Musculoskeletal: Negative for back pain. Skin: Negative for rash. Neurological: Negative for headaches or weakness.  Positive for numbness/tingling.   ____________________________________________   PHYSICAL EXAM:  VITAL SIGNS: ED Triage Vitals  Enc Vitals Group     BP 03/25/20 0841 140/64     Pulse Rate 03/25/20 0841 67     Resp 03/25/20 0841 17     Temp 03/25/20 0841 97.7 F (36.5 C)     Temp Source 03/25/20 0841 Oral     SpO2 03/25/20 0841 95 %     Weight 03/25/20 0838 168 lb (76.2 kg)     Height 03/25/20 0838 5\' 6"  (1.676 m)     Head Circumference --      Peak Flow --      Pain Score 03/25/20 0838 0     Pain Loc --      Pain Edu? --      Excl. in [place]? --     Constitutional: Alert and oriented. Well appearing and in no acute distress. Eyes: Conjunctivae are  normal.  Head: Atraumatic. Nose: No congestion/rhinnorhea. Mouth/Throat: Mucous membranes are moist.   Neck: Normal range of motion.  Cardiovascular: Normal rate, regular rhythm.   Good peripheral circulation. Respiratory: Normal respiratory effort.  No retractions.  Gastrointestinal: Soft and nontender. No distention.  Genitourinary: No flank tenderness. Musculoskeletal: No lower extremity edema.  Extremities warm and well perfused.  Neurologic:  Normal speech and language.  5/5 motor strength and intact fine touch sensation to all extremities, proximal and distal.  Subjective altered sensation to the feet and hands bilaterally. Skin:  Skin is warm and dry. No rash noted. Psychiatric: Mood and affect are normal. Speech and behavior are normal.  ____________________________________________   LABS (all labs ordered are listed, but only abnormal results are displayed)  Labs Reviewed  CBC WITH DIFFERENTIAL/PLATELET  COMPREHENSIVE METABOLIC PANEL  LIPASE, BLOOD  TROPONIN I (HIGH SENSITIVITY)   ____________________________________________  EKG  ED ECG REPORT I, Arta Silence, the attending physician, personally viewed and interpreted this ECG.  Date: 03/25/2020 EKG Time: 1938 Rate: 76 Rhythm: normal sinus rhythm with PVCs QRS Axis: normal Intervals: RBBB ST/T Wave abnormalities: Nonspecific ST abnormalities Narrative Interpretation: Nonspecific abnormalities with no evidence of acute ischemia  ____________________________________________  RADIOLOGY    ____________________________________________   PROCEDURES  Procedure(s) performed: No  Procedures  Critical Care performed: No ____________________________________________   INITIAL IMPRESSION / ASSESSMENT AND PLAN / ED COURSE  Pertinent labs & imaging results that were available during my care of the patient were reviewed by me and considered in my medical decision making (see chart for  details).  79 year old female with PMH as noted above presents with paresthesias to bilateral feet and hands, spreading somewhat up to the lower legs over the last day and associated with some tingling in her abdomen.  The patient also reports upper abdominal tightness and discomfort since yesterday.  I reviewed the past medical records in [place].  The patient had received 2 doses of Enbrel for her psoriatic arthritis and then started to develop these symptoms, so she did not receive a dose this week.  She saw her primary care physician Dr. Caryl Bis yesterday, and per his note:  Tingling  in extremities I suspect this could be related to the Enbrel given the time course.  Symptoms are consistent with neuropathy.  She does have prediabetes which could contribute.  She is overall neurologically intact without any weakness or sensory deficit to light touch.  Lack of weakness and lack of a specific sensory level would argue against Guillain-Barr and transverse myelitis which are listed as potential issues with Enbrel though would seem very unlikely given the patient's exam.  She will see how she does off of the Enbrel and she will follow-up with her rheumatologist regarding that.  We will check lab work to evaluate for other potential causes.  Advised that if she has any spreading of her symptoms or she develops any weakness she needs to be evaluated immediately to consider imaging.  She had vitamin B12, hemoglobin A1c, TSH sent yesterday.  On exam today, the patient is overall well-appearing.  Her vital signs are normal except for mild hypertension.  She continues to have only subjective altered sensation without actual deficits to light touch and with no motor symptoms.  As above, this is not consistent with GBS or transverse myelitis.  Presentation is most consistent with peripheral neuropathy.  The upper abdominal pain appears benign in nature and is not associated with any tenderness.  However, given  that this is a new finding I will obtain CMP, lipase, CBC and a troponin in case this is an anginal equivalent although my suspicion for cardiac cause is low.  ----------------------------------------- 1:10 PM on 03/25/2020 -----------------------------------------  Lab work-up including troponin and lipase are all within normal limits. Given the duration of the symptoms there is no indication for repeat troponin. At this time, the patient is comfortable and feels well to go home. I counseled her on the results of the work-up. I recommended that she continue to monitor her symptoms and follow-up with Dr. Caryl Bis as instructed. Return precautions given, and she expresses understanding. ____________________________________________   FINAL CLINICAL IMPRESSION(S) / ED DIAGNOSES  Final diagnoses:  Paresthesias      NEW MEDICATIONS STARTED DURING THIS VISIT:  New Prescriptions   No medications on file     Note:  This document was prepared using Dragon voice recognition software and may include unintentional dictation errors.    Arta Silence, MD 03/25/20 1310

## 2020-03-25 NOTE — ED Notes (Signed)
Pt denies back pain; reports 2/10 "stomach pain" and states it "feels like cramping up near the top". Pt resting calmly in bed.

## 2020-03-25 NOTE — Discharge Instructions (Addendum)
Follow-up with Dr. Caryl Bis as scheduled.  Return to the ER for new, worsening, or persistent severe numbness, any weakness or decrease in motor function, decrease in grip strength, coordination, difficulty walking, shortness of breath, chest pain, or any other new or worsening symptoms that concern you.

## 2020-03-26 ENCOUNTER — Telehealth: Payer: Self-pay | Admitting: Family Medicine

## 2020-03-26 DIAGNOSIS — R202 Paresthesia of skin: Secondary | ICD-10-CM

## 2020-03-26 NOTE — Telephone Encounter (Signed)
Patient has been informed.

## 2020-03-26 NOTE — Telephone Encounter (Signed)
She could try colace or sennakot to see if either of those are beneficial.

## 2020-03-26 NOTE — Telephone Encounter (Signed)
PT called in having problem with constipation and some numbness

## 2020-03-26 NOTE — Telephone Encounter (Signed)
Pt has already gone to ED. She is mainly stating she is constipated

## 2020-03-26 NOTE — Telephone Encounter (Signed)
Patient has been trying miralax, has not helped as of yet.

## 2020-03-26 NOTE — Telephone Encounter (Signed)
I have referred her to neurology for the tingling and numbness. I would suggest she try miralax over the counter for the constipation and see if that is helpful.

## 2020-03-27 ENCOUNTER — Inpatient Hospital Stay
Admission: EM | Admit: 2020-03-27 | Discharge: 2020-04-02 | DRG: 074 | Disposition: A | Discharge status: Other Acute Inpatient Hospital | Payer: Medicare Other | Attending: Internal Medicine | Admitting: Internal Medicine

## 2020-03-27 ENCOUNTER — Emergency Department: Payer: Medicare Other

## 2020-03-27 ENCOUNTER — Other Ambulatory Visit: Payer: Self-pay

## 2020-03-27 DIAGNOSIS — M8580 Other specified disorders of bone density and structure, unspecified site: Secondary | ICD-10-CM | POA: Diagnosis present

## 2020-03-27 DIAGNOSIS — K219 Gastro-esophageal reflux disease without esophagitis: Secondary | ICD-10-CM | POA: Diagnosis not present

## 2020-03-27 DIAGNOSIS — R531 Weakness: Secondary | ICD-10-CM

## 2020-03-27 DIAGNOSIS — I441 Atrioventricular block, second degree: Secondary | ICD-10-CM | POA: Diagnosis present

## 2020-03-27 DIAGNOSIS — M542 Cervicalgia: Secondary | ICD-10-CM

## 2020-03-27 DIAGNOSIS — E871 Hypo-osmolality and hyponatremia: Secondary | ICD-10-CM | POA: Diagnosis not present

## 2020-03-27 DIAGNOSIS — M1990 Unspecified osteoarthritis, unspecified site: Secondary | ICD-10-CM | POA: Diagnosis present

## 2020-03-27 DIAGNOSIS — X58XXXA Exposure to other specified factors, initial encounter: Secondary | ICD-10-CM | POA: Diagnosis present

## 2020-03-27 DIAGNOSIS — Z79899 Other long term (current) drug therapy: Secondary | ICD-10-CM

## 2020-03-27 DIAGNOSIS — W19XXXA Unspecified fall, initial encounter: Secondary | ICD-10-CM | POA: Diagnosis present

## 2020-03-27 DIAGNOSIS — R202 Paresthesia of skin: Secondary | ICD-10-CM

## 2020-03-27 DIAGNOSIS — G4733 Obstructive sleep apnea (adult) (pediatric): Secondary | ICD-10-CM | POA: Diagnosis not present

## 2020-03-27 DIAGNOSIS — R140 Abdominal distension (gaseous): Secondary | ICD-10-CM

## 2020-03-27 DIAGNOSIS — Z803 Family history of malignant neoplasm of breast: Secondary | ICD-10-CM

## 2020-03-27 DIAGNOSIS — Z950 Presence of cardiac pacemaker: Secondary | ICD-10-CM

## 2020-03-27 DIAGNOSIS — Y92239 Unspecified place in hospital as the place of occurrence of the external cause: Secondary | ICD-10-CM | POA: Diagnosis not present

## 2020-03-27 DIAGNOSIS — G6289 Other specified polyneuropathies: Secondary | ICD-10-CM

## 2020-03-27 DIAGNOSIS — J9811 Atelectasis: Secondary | ICD-10-CM | POA: Diagnosis present

## 2020-03-27 DIAGNOSIS — G839 Paralytic syndrome, unspecified: Secondary | ICD-10-CM | POA: Diagnosis present

## 2020-03-27 DIAGNOSIS — T50995A Adverse effect of other drugs, medicaments and biological substances, initial encounter: Secondary | ICD-10-CM | POA: Diagnosis not present

## 2020-03-27 DIAGNOSIS — I10 Essential (primary) hypertension: Secondary | ICD-10-CM | POA: Diagnosis present

## 2020-03-27 DIAGNOSIS — Z87891 Personal history of nicotine dependence: Secondary | ICD-10-CM

## 2020-03-27 DIAGNOSIS — N83292 Other ovarian cyst, left side: Secondary | ICD-10-CM | POA: Diagnosis present

## 2020-03-27 DIAGNOSIS — K5900 Constipation, unspecified: Secondary | ICD-10-CM | POA: Diagnosis present

## 2020-03-27 DIAGNOSIS — T394X5A Adverse effect of antirheumatics, not elsewhere classified, initial encounter: Secondary | ICD-10-CM | POA: Diagnosis present

## 2020-03-27 DIAGNOSIS — Z7401 Bed confinement status: Secondary | ICD-10-CM

## 2020-03-27 DIAGNOSIS — Z823 Family history of stroke: Secondary | ICD-10-CM

## 2020-03-27 DIAGNOSIS — R262 Difficulty in walking, not elsewhere classified: Secondary | ICD-10-CM | POA: Diagnosis present

## 2020-03-27 DIAGNOSIS — R200 Anesthesia of skin: Secondary | ICD-10-CM | POA: Diagnosis present

## 2020-03-27 DIAGNOSIS — R0602 Shortness of breath: Secondary | ICD-10-CM

## 2020-03-27 DIAGNOSIS — L4050 Arthropathic psoriasis, unspecified: Secondary | ICD-10-CM | POA: Diagnosis present

## 2020-03-27 DIAGNOSIS — I4510 Unspecified right bundle-branch block: Secondary | ICD-10-CM | POA: Diagnosis present

## 2020-03-27 DIAGNOSIS — E785 Hyperlipidemia, unspecified: Secondary | ICD-10-CM | POA: Diagnosis present

## 2020-03-27 DIAGNOSIS — Z20822 Contact with and (suspected) exposure to covid-19: Secondary | ICD-10-CM | POA: Diagnosis present

## 2020-03-27 DIAGNOSIS — G619 Inflammatory polyneuropathy, unspecified: Principal | ICD-10-CM | POA: Diagnosis present

## 2020-03-27 DIAGNOSIS — I2510 Atherosclerotic heart disease of native coronary artery without angina pectoris: Secondary | ICD-10-CM | POA: Diagnosis present

## 2020-03-27 LAB — CSF CELL COUNT WITH DIFFERENTIAL
RBC Count, CSF: 5 /mm3 — ABNORMAL HIGH (ref 0–3)
Tube #: 2
WBC, CSF: 0 /mm3 (ref 0–5)

## 2020-03-27 LAB — URINALYSIS, COMPLETE (UACMP) WITH MICROSCOPIC
Bacteria, UA: NONE SEEN
Bilirubin Urine: NEGATIVE
Glucose, UA: NEGATIVE mg/dL
Ketones, ur: NEGATIVE mg/dL
Leukocytes,Ua: NEGATIVE
Nitrite: NEGATIVE
Protein, ur: NEGATIVE mg/dL
Specific Gravity, Urine: 1.002 — ABNORMAL LOW (ref 1.005–1.030)
Squamous Epithelial / HPF: NONE SEEN (ref 0–5)
WBC, UA: NONE SEEN WBC/hpf (ref 0–5)
pH: 7 (ref 5.0–8.0)

## 2020-03-27 LAB — BASIC METABOLIC PANEL
Anion gap: 9 (ref 5–15)
BUN: 9 mg/dL (ref 8–23)
CO2: 22 mmol/L (ref 22–32)
Calcium: 8.9 mg/dL (ref 8.9–10.3)
Chloride: 99 mmol/L (ref 98–111)
Creatinine, Ser: 0.47 mg/dL (ref 0.44–1.00)
GFR calc Af Amer: >60 mL/min (ref >60)
GFR calc non Af Amer: >60 mL/min (ref >60)
Glucose, Bld: 102 mg/dL — ABNORMAL HIGH (ref 70–99)
Potassium: 4 mmol/L (ref 3.5–5.1)
Sodium: 130 mmol/L — ABNORMAL LOW (ref 135–145)

## 2020-03-27 LAB — CBC
HCT: 38.3 % (ref 36.0–46.0)
Hemoglobin: 12.8 g/dL (ref 12.0–15.0)
MCH: 30.1 pg (ref 26.0–34.0)
MCHC: 33.4 g/dL (ref 30.0–36.0)
MCV: 90.1 fL (ref 80.0–100.0)
Platelets: 226 10*3/uL (ref 150–400)
RBC: 4.25 MIL/uL (ref 3.87–5.11)
RDW: 12.3 % (ref 11.5–15.5)
WBC: 5.7 10*3/uL (ref 4.0–10.5)
nRBC: 0 % (ref 0.0–0.2)

## 2020-03-27 LAB — PROTEIN AND GLUCOSE, CSF
Glucose, CSF: 53 mg/dL (ref 40–70)
Total  Protein, CSF: 33 mg/dL (ref 15–45)

## 2020-03-27 LAB — TSH: TSH: 1.312 u[IU]/mL (ref 0.350–4.500)

## 2020-03-27 LAB — VITAMIN B12: Vitamin B-12: 323 pg/mL (ref 180–914)

## 2020-03-27 LAB — TROPONIN I (HIGH SENSITIVITY)
Troponin I (High Sensitivity): 7 ng/L (ref <18)
Troponin I (High Sensitivity): 8 ng/L (ref <18)

## 2020-03-27 LAB — SARS CORONAVIRUS 2 BY RT PCR (HOSPITAL ORDER, PERFORMED IN ~~LOC~~ HOSPITAL LAB): SARS Coronavirus 2: NEGATIVE

## 2020-03-27 MED ORDER — DESONIDE 0.05 % EX OINT: 1.0000 "application " | TOPICAL_OINTMENT | Freq: Two times a day (BID) | CUTANEOUS | Status: DC | PRN

## 2020-03-27 MED ORDER — IRBESARTAN 150 MG PO TABS
75.0000 mg | ORAL_TABLET | Freq: Every day | ORAL | Status: DC
  Administered 2020-03-28 – 2020-04-02 (×6): 75 mg via ORAL
  Filled 2020-03-27 (×6): qty 1

## 2020-03-27 MED ORDER — IOHEXOL 350 MG/ML SOLN
75.0000 mL | Freq: Once | INTRAVENOUS | Status: AC | PRN
  Administered 2020-03-27: 75 mL via INTRAVENOUS

## 2020-03-27 MED ORDER — SODIUM CHLORIDE 0.9% FLUSH: 3.0000 mL | INTRAVENOUS | Status: DC | PRN

## 2020-03-27 MED ORDER — ACETAMINOPHEN 325 MG PO TABS: 325.0000 mg | ORAL_TABLET | Freq: Four times a day (QID) | ORAL | Status: DC | PRN

## 2020-03-27 MED ORDER — ACETAMINOPHEN 325 MG PO TABS: 650.0000 mg | ORAL_TABLET | Freq: Four times a day (QID) | ORAL | Status: DC | PRN

## 2020-03-27 MED ORDER — FAMOTIDINE 20 MG PO TABS
20.0000 mg | ORAL_TABLET | Freq: Two times a day (BID) | ORAL | Status: DC
  Administered 2020-03-27 – 2020-04-02 (×6): 20 mg via ORAL
  Filled 2020-03-27 (×13): qty 1

## 2020-03-27 MED ORDER — ENOXAPARIN SODIUM 40 MG/0.4ML ~~LOC~~ SOLN
40.0000 mg | SUBCUTANEOUS | Status: DC
  Administered 2020-03-27 – 2020-04-02 (×7): 40 mg via SUBCUTANEOUS
  Filled 2020-03-27 (×7): qty 0.4

## 2020-03-27 MED ORDER — SODIUM CHLORIDE 0.9 % IV SOLN: 250.0000 mL | INTRAVENOUS | Status: DC | PRN

## 2020-03-27 MED ORDER — CLOBETASOL PROPIONATE 0.05 % EX CREA
1.0000 "application " | TOPICAL_CREAM | CUTANEOUS | Status: DC
  Filled 2020-03-27 (×2): qty 15

## 2020-03-27 MED ORDER — ONDANSETRON HCL 4 MG PO TABS: 4.0000 mg | ORAL_TABLET | Freq: Four times a day (QID) | ORAL | Status: DC | PRN

## 2020-03-27 MED ORDER — PSYLLIUM 95 % PO PACK
1.0000 | PACK | Freq: Every day | ORAL | Status: DC
  Administered 2020-03-27 – 2020-04-02 (×7): 1 via ORAL
  Filled 2020-03-27 (×9): qty 1

## 2020-03-27 MED ORDER — TACROLIMUS 0.1 % EX OINT: TOPICAL_OINTMENT | Freq: Two times a day (BID) | CUTANEOUS | Status: DC | PRN

## 2020-03-27 MED ORDER — LIDOCAINE HCL (PF) 1 % IJ SOLN
INTRAMUSCULAR | Status: AC
  Filled 2020-03-27: qty 5

## 2020-03-27 MED ORDER — VITAMIN D3 25 MCG (1000 UNIT) PO TABS
2000.0000 [IU] | ORAL_TABLET | Freq: Every day | ORAL | Status: DC
  Administered 2020-03-27 – 2020-04-02 (×6): 2000 [IU] via ORAL
  Filled 2020-03-27 (×15): qty 2

## 2020-03-27 MED ORDER — SODIUM CHLORIDE 0.9% FLUSH
3.0000 mL | Freq: Two times a day (BID) | INTRAVENOUS | Status: DC
  Administered 2020-03-27 – 2020-04-02 (×11): 3 mL via INTRAVENOUS

## 2020-03-27 MED ORDER — ACETAMINOPHEN 650 MG RE SUPP: 650.0000 mg | Freq: Four times a day (QID) | RECTAL | Status: DC | PRN

## 2020-03-27 MED ORDER — ONDANSETRON HCL 4 MG/2ML IJ SOLN: 4.0000 mg | Freq: Four times a day (QID) | INTRAMUSCULAR | Status: DC | PRN

## 2020-03-27 NOTE — ED Triage Notes (Signed)
Patient arrived via EMS from [facility]. Patient is AOx4 and ambulatory at baseline. Patient chief complaint is weakness. Patient was seen recently for same complaint. Patient is in no pain or distress. Patient states she just can not eat anything.

## 2020-03-27 NOTE — ED Notes (Signed)
Report given to [facility] by angela rn

## 2020-03-27 NOTE — ED Notes (Signed)
ED MD has spoken with Neuro MD and Neuro MD will be moving to ED to assess patient ASAP.

## 2020-03-27 NOTE — H&P (Signed)
History and Physical    SOPHEA RACKHAM YQI:[id number] DOB: August 30, 1941 DOA: 03/27/2020  PCP: Leone Haven, MD   Patient coming from: Home  I have personally briefly reviewed patient's old medical records in [place]  Chief Complaint: Progressive numbness and weakness  HPI: RODOLFO NOTARO is a 79 y.o. female with medical history significant for coronary artery disease, status post pacemaker insertion, history of GERD, hypertension, psoriasis and arthritis who presents to the emergency room for evaluation of worsening numbness and tingling which initially started in her lower extremities but has continued to progress involving her torso and now upper extremities.  Patient initially saw her doctor and was diagnosed with a neuropathy and discharged home but she presented to the ER because she has been getting weaker in both her upper and lower extremities along with ascending paresthesia.  Patient was recently started on Enbrel injections for her psoriatic arthritis and has received 2 doses. She denies having any chest pain, shortness of breath, dizziness, lightheadedness, no falls, no cough, no abdominal pain but complains of constipation and has not had a bowel movement in about 5 days.  She denies having any nausea or vomiting. She had a lumbar puncture done in the emergency room due to concerns for possible Guillain-Barr syndrome and CSF showed normal WBC and spinal fluid protein of 33. Patient had a CT angiogram of the head and neck  which showed no acute intracranial abnormality.  Negative CT head. No significant carotid or vertebral artery stenosis in the neck. 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   ED Course: Patient is an 79 year old Caucasian female with multiple medical problems including psoriatic arthropathy who was recently started on Enbrel and has received 2  doses.  She presents to the ER for evaluation of ascending numbness and paresthesia which initially started in her lower extremities and has progressed to involve her torso and upper extremities.  Patient had a spinal tap done in the ER which showed a normal WBC and spinal fluid protein of 33.  She has been seen by neurology and will be referred to observation status for further evaluation.  Review of Systems: As per HPI otherwise 10 point review of systems negative.    Past Medical History:  Diagnosis Date  . Ankle pain, right   . Atypical chest pain    a. 10/2018 MV: EF 57%. No ischemia/infarct.  . Coronary artery disease   . Diastolic dysfunction    a. 04/2018 Echo: EF 55-60%, no rwma, Gr1 DD. Triv AI, mild MR. Mod dil LA. Nl RV fxn. PASP nl.  . GERD (gastroesophageal reflux disease)   . Headache    "dull one sometimes daily, at least weekly in last couple months" (07/22/2018)  . Hyperlipidemia   . Hypertension   . 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 Joint pain in fingers of right hand   . Lichen sclerosus   . Osteopenia   . Pneumonia 2012? X 1  . 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 Psoriasis   . Psoriatic arthritis ([place])    " dx'd the 1st of this year, 2019" (07/22/2018)  . Psoriatic arthritis ([place])   . Rosacea   . Seborrheic keratosis   . Second degree heart block    a. 07/2018 s/p SJM Assurity MRI model [id number] (Ser# [id number]).  . Sleep apnea    "using nasal pilllows since ~ 12/2017" (07/22/2018)  .  TMJ (dislocation of temporomandibular joint)     Past Surgical History:  Procedure Laterality Date  . APPENDECTOMY    . BREAST CYST ASPIRATION Bilateral   . BREAST CYST EXCISION Right 1978   benign  . COLONOSCOPY    . COLONOSCOPY WITH PROPOFOL N/A 07/10/2019   Procedure: COLONOSCOPY WITH PROPOFOL;  Surgeon: Lollie Sails, MD;  Location: [facility] ENDOSCOPY;  Service: Endoscopy;  Laterality: N/A;  . ESOPHAGOGASTRODUODENOSCOPY (EGD) WITH PROPOFOL N/A 07/10/2019     Procedure: ESOPHAGOGASTRODUODENOSCOPY (EGD) WITH PROPOFOL;  Surgeon: Lollie Sails, MD;  Location: [facility] ENDOSCOPY;  Service: Endoscopy;  Laterality: N/A;  . HYSTEROSCOPY WITH D & C N/A 11/05/2018   Procedure: DILATATION AND CURETTAGE /HYSTEROSCOPY;  Surgeon: Homero Fellers, MD;  Location: [facility] ORS;  Service: Gynecology;  Laterality: N/A;  . INSERT / REPLACE / REMOVE PACEMAKER    . 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 UTERINE POLYPS REMOVAL       reports that she has quit smoking. Her smoking use included cigarettes. She has never used smokeless tobacco. She reports current alcohol use. She reports that she does not use drugs.  Allergies  Allergen Reactions  . Nickel     Family History  Problem Relation Age of Onset  . Stroke Mother   . Atrial fibrillation Mother   . Breast cancer Mother   . Stroke Father   . Breast cancer Maternal Aunt   . Breast cancer Paternal Aunt      Prior to Admission medications   Medication Sig Start Date End Date Taking? Authorizing Provider  acetaminophen (TYLENOL) 325 MG tablet Take 325-650 mg by mouth every 6 (six) hours as needed for mild pain or headache.     [provider]  Calcium Citrate (CITRACAL PO) Take 1 Dose by mouth daily.    [provider]  Cholecalciferol (VITAMIN D3) 50 MCG (2000 UT) TABS Take 2,000 Units by mouth daily.    [provider]  Clobetasol Prop Emollient Base (CLOBETASOL PROPIONATE E) 0.05 % emollient cream Apply 1 application topically every Sunday.     [provider]  desonide (DESOWEN) 0.05 % ointment Apply 1 application topically 2 (two) times daily as needed (for psoriasis).     [provider]  ENBREL SURECLICK 50 MG/ML injection Inject into the skin once a week. Patient not taking: Reported on 03/24/2020 02/19/20   [provider]  famotidine (PEPCID) 20 MG tablet Take 20 mg  by mouth 2 (two) times daily.    [provider]  Methylcellulose, Laxative, (CITRUCEL PO) Take 1 Dose by mouth.    [provider]  metroNIDAZOLE (METROCREAM) 0.75 % cream Apply topically 2 (two) times daily as needed (facial redness/irritation.).     [provider]  nystatin cream (MYCOSTATIN) Apply 1 application topically 2 (two) times daily as needed for dry skin.     [provider]  rosuvastatin (CRESTOR) 5 MG tablet Take 1 tablet (5 mg total) by mouth daily. 04/29/19 07/28/19  Minna Merritts, MD  tacrolimus (PROTOPIC) 0.1 % ointment Apply topically 2 (two) times daily as needed (for psoriasis).     [provider]  valsartan (DIOVAN) 80 MG tablet TAKE 1 TABLET DAILY Patient taking differently: Take 80 mg by mouth daily.  11/05/19   Leone Haven, MD    Physical Exam: Vitals:   03/27/20 1430 03/27/20 1500 03/27/20 1530 03/27/20 1600  BP: (!) 142/73 (!) 143/78 (!) 148/74 (!) 147/76  Pulse: 83 81 83 81  Resp: 16 18 18 18   Temp:      TempSrc:      SpO2: 95% 96% 95% 96%  Weight:      Height:         Vitals:   03/27/20 1430 03/27/20 1500 03/27/20 1530 03/27/20 1600  BP: (!) 142/73 (!) 143/78 (!) 148/74 (!) 147/76  Pulse: 83 81 83 81  Resp: 16 18 18 18   Temp:      TempSrc:      SpO2: 95% 96% 95% 96%  Weight:      Height:        Constitutional: NAD, alert and oriented x 3 Eyes: PERRL, lids and conjunctivae normal ENMT: Mucous membranes are moist.  Neck: normal, supple, no masses, no thyromegaly Respiratory: clear to auscultation bilaterally, no wheezing, no crackles. Normal respiratory effort. No accessory muscle use.  Cardiovascular: Regular rate and rhythm, no murmurs / rubs / gallops. No extremity edema. 2+ pedal pulses. No carotid bruits.  Abdomen: no tenderness, no masses,  palpated. No hepatosplenomegaly. Bowel sounds positive, distended Musculoskeletal: no clubbing / cyanosis. No joint deformity upper and lower  extremities.  Skin: no rashes, lesions, ulcers.  Neurologic: No gross focal neurologic deficit. Able to move all extremities Psychiatric: Normal mood and affect.   Labs on Admission: I have personally reviewed following labs and imaging studies  CBC: Recent Labs  Lab 03/25/20 0950 03/27/20 1029  WBC 5.4 5.7  NEUTROABS 3.8  --   HGB 13.5 12.8  HCT 40.8 38.3  MCV 92.1 90.1  PLT 228 353   Basic Metabolic Panel: Recent Labs  Lab 03/24/20 1438 03/25/20 1023 03/27/20 1029  NA 139 141 130*  K 4.5 4.2 4.0  CL 103 106 99  CO2 28 28 22   GLUCOSE 78 99 102*  BUN 11 10 9   CREATININE 0.59 0.61 0.47  CALCIUM 9.7 9.2 8.9   GFR: Estimated Creatinine Clearance: 58.5 mL/min (by C-G formula based on SCr of 0.47 mg/dL). Liver Function Tests: Recent Labs  Lab 03/24/20 1438 03/25/20 1023  AST 15 16  ALT 13 15  ALKPHOS 87 78  BILITOT 0.6 1.0  PROT 7.0 7.2  ALBUMIN 4.1 3.7   Recent Labs  Lab 03/25/20 1023  LIPASE 26   No results for input(s): AMMONIA in the last 168 hours. Coagulation Profile: No results for input(s): INR, PROTIME in the last 168 hours. Cardiac Enzymes: No results for input(s): CKTOTAL, CKMB, CKMBINDEX, TROPONINI in the last 168 hours. BNP (last 3 results) No results for input(s): PROBNP in the last 8760 hours. HbA1C: No results for input(s): HGBA1C in the last 72 hours. CBG: No results for input(s): GLUCAP in the last 168 hours. Lipid Profile: No results for input(s): CHOL, HDL, LDLCALC, TRIG, CHOLHDL, LDLDIRECT in the last 72 hours. Thyroid Function Tests: Recent Labs    03/27/20 1247  TSH 1.312   Anemia Panel: No results for input(s): VITAMINB12, FOLATE, FERRITIN, TIBC, IRON, RETICCTPCT in the last 72 hours. Urine analysis:    Component Value Date/Time   COLORURINE STRAW (A) 03/27/2020 1159   APPEARANCEUR CLEAR (A) 03/27/2020 1159   APPEARANCEUR Clear 09/16/2018 1543   LABSPEC 1.002 (L) 03/27/2020 1159   PHURINE 7.0 03/27/2020 1159    GLUCOSEU NEGATIVE 03/27/2020 1159   HGBUR SMALL (A) 03/27/2020 1159   BILIRUBINUR NEGATIVE 03/27/2020 1159   BILIRUBINUR Negative 11/12/2018 0949   BILIRUBINUR Negative 09/16/2018 1543  KETONESUR NEGATIVE 03/27/2020 1159   PROTEINUR NEGATIVE 03/27/2020 1159   UROBILINOGEN 0.2 11/12/2018 0949   NITRITE NEGATIVE 03/27/2020 1159   LEUKOCYTESUR NEGATIVE 03/27/2020 1159    Radiological Exams on Admission: CT ANGIO HEAD W OR WO CONTRAST  Result Date: 03/27/2020 CLINICAL DATA:  Weakness arms in legs.  Neck pain EXAM: CT ANGIOGRAPHY HEAD AND NECK TECHNIQUE: Multidetector CT imaging of the head and neck was performed using the standard protocol during bolus administration of intravenous contrast. Multiplanar CT image reconstructions and MIPs were obtained to evaluate the vascular anatomy. Carotid stenosis measurements (when applicable) are obtained utilizing NASCET criteria, using the distal internal carotid diameter as the denominator. CONTRAST:  17mL OMNIPAQUE IOHEXOL 350 MG/ML SOLN COMPARISON:  None. FINDINGS: CT HEAD FINDINGS Brain: No evidence of acute infarction, hemorrhage, hydrocephalus, extra-axial collection or mass lesion/mass effect. Vascular: Negative for hyperdense vessel Skull: Negative Sinuses: Negative Orbits: Negative Review of the MIP images confirms the above findings CTA NECK FINDINGS Aortic arch: Standard branching. Imaged portion shows no evidence of aneurysm or dissection. No significant stenosis of the major arch vessel origins. Mild atherosclerotic disease in the aortic arch and proximal great vessels. Right carotid system: Right carotid artery widely patent without stenosis. Small calcification right carotid bifurcation. Left carotid system: Left carotid widely patent without stenosis. Mild atherosclerotic calcification left carotid bifurcation Vertebral arteries: Both vertebral arteries patent to the basilar without stenosis. Skeleton: Mild degenerative changes in the cervical  spine without acute abnormality. Other neck: Negative for mass or adenopathy in neck Upper chest: Left-sided pacemaker. Left arm contrast injection. There is stenosis or occlusion of the left innominate vein with opacification of numerous collateral vessels in the neck and around the lower cervical spine. Review of the MIP images confirms the above findings CTA HEAD FINDINGS Anterior circulation: Mild atherosclerotic calcification in the cavernous carotid bilaterally without stenosis. Anterior and middle cerebral arteries widely patent bilaterally without stenosis or branch occlusion. Posterior circulation: Both vertebral arteries patent to the basilar. PICA patent bilaterally. Basilar widely patent. Superior cerebellar and posterior cerebral arteries patent bilaterally. Fetal origin of the left posterior cerebral artery. Venous sinuses: Normal venous enhancement Anatomic variants: None Review of the MIP images confirms the above findings IMPRESSION: 1. No acute intracranial abnormality.  Negative CT head 2. No significant carotid or vertebral artery stenosis in the neck. No intracranial stenosis or large vessel occlusion 3. 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 Electronically Signed   By: Franchot Gallo M.D.   On: 03/27/2020 13:06   CT Angio Neck W and/or Wo Contrast  Result Date: 03/27/2020 CLINICAL DATA:  Weakness arms in legs.  Neck pain EXAM: CT ANGIOGRAPHY HEAD AND NECK TECHNIQUE: Multidetector CT imaging of the head and neck was performed using the standard protocol during bolus administration of intravenous contrast. Multiplanar CT image reconstructions and MIPs were obtained to evaluate the vascular anatomy. Carotid stenosis measurements (when applicable) are obtained utilizing NASCET criteria, using the distal internal carotid diameter as the denominator. CONTRAST:  29mL OMNIPAQUE IOHEXOL 350 MG/ML SOLN COMPARISON:  None. FINDINGS: CT HEAD  FINDINGS Brain: No evidence of acute infarction, hemorrhage, hydrocephalus, extra-axial collection or mass lesion/mass effect. Vascular: Negative for hyperdense vessel Skull: Negative Sinuses: Negative Orbits: Negative Review of the MIP images confirms the above findings CTA NECK FINDINGS Aortic arch: Standard branching. Imaged portion shows no evidence of aneurysm or dissection. No significant stenosis of the major arch vessel origins. Mild atherosclerotic disease in the aortic arch and  proximal great vessels. Right carotid system: Right carotid artery widely patent without stenosis. Small calcification right carotid bifurcation. Left carotid system: Left carotid widely patent without stenosis. Mild atherosclerotic calcification left carotid bifurcation Vertebral arteries: Both vertebral arteries patent to the basilar without stenosis. Skeleton: Mild degenerative changes in the cervical spine without acute abnormality. Other neck: Negative for mass or adenopathy in neck Upper chest: Left-sided pacemaker. Left arm contrast injection. There is stenosis or occlusion of the left innominate vein with opacification of numerous collateral vessels in the neck and around the lower cervical spine. Review of the MIP images confirms the above findings CTA HEAD FINDINGS Anterior circulation: Mild atherosclerotic calcification in the cavernous carotid bilaterally without stenosis. Anterior and middle cerebral arteries widely patent bilaterally without stenosis or branch occlusion. Posterior circulation: Both vertebral arteries patent to the basilar. PICA patent bilaterally. Basilar widely patent. Superior cerebellar and posterior cerebral arteries patent bilaterally. Fetal origin of the left posterior cerebral artery. Venous sinuses: Normal venous enhancement Anatomic variants: None Review of the MIP images confirms the above findings IMPRESSION: 1. No acute intracranial abnormality.  Negative CT head 2. No significant carotid  or vertebral artery stenosis in the neck. No intracranial stenosis or large vessel occlusion 3. 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 Electronically Signed   By: Franchot Gallo M.D.   On: 03/27/2020 13:06    EKG: Independently reviewed.  Sinus rhythm Right bundle branch block  Assessment/Plan Principal Problem:   Numbness and tingling of lower extremity Active Problems:   Hypertension   OSA (obstructive sleep apnea)   GERD (gastroesophageal reflux disease)   Numbness and tingling of lower extremity  Which has progressed to involve the Upper Extremity With associated paresthesia  Unclear etiology Continue supportive care We will request neurology consult Guillian Barre syndrome has been ruled out since patient does not have any cytoalbuminological dissociation   Hypertension Continue Avapro  GERD Continue Pepcid   History of psoriasis Continue tacrolimus ointment as well as desonide   DVT prophylaxis: Lovenox Code Status: Full code Family Communication: Greater than 50% of time was spent discussing plan of care with patient at the bedside.  She verbalizes understanding and agrees with the plan.  All questions and concerns have been addressed. Disposition Plan: Back to previous home environment Consults called: Neurology    Collier Bullock MD [facility]     03/27/2020, 4:55 PM

## 2020-03-27 NOTE — ED Notes (Signed)
ED MD is performing Lumbar Puncture at this time.

## 2020-03-27 NOTE — ED Notes (Signed)
Patient was able to ambulate to bedside commode in room with some minimal, needed assistance from staff.

## 2020-03-27 NOTE — Progress Notes (Signed)
Attempted to call back to receive report on patient, no answer.

## 2020-03-27 NOTE — Consult Note (Addendum)
Reason for Consult: progressive numbness and weakness  Requesting Physician: Dr. Kerman Passey   CC: progressive weakness and numbness.     HPI: Alison Huffman is an 79 y.o. female with a past medical history of CAD, pacemaker, gastric reflux, hypertension, hyperlipidemia, psoriasis, arthritis, presents to the emergency department for worsening numbness and tingling.  According to the patient she got her Enbrel injection approximately 1 week ago with total 2 doses first one being beginning of June.   She states since she has been experiencing tingling in her feet that has been progressing up her body that started last Wednesday.  Patient states initially she saw her doctor who diagnosed her with neuropathy however the numbness and tingling sensation continued to progress up her legs and into her upper extremities as well.  Today patient states she has been getting weaker in both upper and lower extremities along with paraesthesias ascending.  Pt was on Humara prior to Enbrel injections.      Past Medical History:  Diagnosis Date  . Ankle pain, right   . Atypical chest pain    a. 10/2018 MV: EF 57%. No ischemia/infarct.  . Coronary artery disease   . Diastolic dysfunction    a. 04/2018 Echo: EF 55-60%, no rwma, Gr1 DD. Triv AI, mild MR. Mod dil LA. Nl RV fxn. PASP nl.  . GERD (gastroesophageal reflux disease)   . Headache    "dull one sometimes daily, at least weekly in last couple months" (07/22/2018)  . Hyperlipidemia   . Hypertension   . 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 Joint pain in fingers of right hand   . Lichen sclerosus   . Osteopenia   . Pneumonia 2012? X 1  . 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 Psoriasis   . Psoriatic arthritis ([place])    " dx'd the 1st of this year, 2019" (07/22/2018)  . Psoriatic arthritis ([place])   . Rosacea   . Seborrheic keratosis   . Second degree heart block    a. 07/2018 s/p SJM Assurity MRI model PM2271 (Ser# [id number]).  . Sleep  apnea    "using nasal pilllows since ~ 12/2017" (07/22/2018)  . TMJ (dislocation of temporomandibular joint)     Past Surgical History:  Procedure Laterality Date  . APPENDECTOMY    . BREAST CYST ASPIRATION Bilateral   . BREAST CYST EXCISION Right 1978   benign  . COLONOSCOPY    . COLONOSCOPY WITH PROPOFOL N/A 07/10/2019   Procedure: COLONOSCOPY WITH PROPOFOL;  Surgeon: Lollie Sails, MD;  Location: [facility] ENDOSCOPY;  Service: Endoscopy;  Laterality: N/A;  . ESOPHAGOGASTRODUODENOSCOPY (EGD) WITH PROPOFOL N/A 07/10/2019   Procedure: ESOPHAGOGASTRODUODENOSCOPY (EGD) WITH PROPOFOL;  Surgeon: Lollie Sails, MD;  Location: [facility] ENDOSCOPY;  Service: Endoscopy;  Laterality: N/A;  . HYSTEROSCOPY WITH D & C N/A 11/05/2018   Procedure: DILATATION AND CURETTAGE /HYSTEROSCOPY;  Surgeon: Homero Fellers, MD;  Location: [facility] ORS;  Service: Gynecology;  Laterality: N/A;  . INSERT / REPLACE / REMOVE PACEMAKER    . 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 UTERINE POLYPS REMOVAL      Family History  Problem Relation Age of Onset  . Stroke Mother   . Atrial fibrillation Mother   . Breast cancer Mother   . Stroke Father   . Breast cancer Maternal Aunt   . Breast cancer Paternal Aunt     Social History:  reports that she has quit smoking. Her smoking use included cigarettes. She has never used smokeless tobacco. She reports current alcohol use. She reports that she does not use drugs.  Allergies  Allergen Reactions  . Nickel     Medications: I have reviewed the patient's current medications.    Physical Examination: Blood pressure 138/75, pulse 69, temperature 98.2 F (36.8 C), temperature source Oral, resp. rate 20, height 5\' 6"  (1.676 m), weight 76.2 kg, SpO2 95 %.   Neurological Examination   Mental Status: Alert, oriented, thought content appropriate.  Speech fluent without evidence of aphasia.   Able to follow 3 step commands without difficulty. Cranial Nerves: II:  Visual fields grossly normal, pupils equal, round, reactive to light and accommodation III,IV, VI: ptosis not present, extra-ocular motions intact bilaterally V,VII: smile symmetric, facial light touch sensation normal bilaterally VIII: hearing normal bilaterally IX,X: gag reflex present XI: bilateral shoulder shrug XII: midline tongue extension Motor: Generalized weakness with 4/5 bilaterally Sensory: Pinprick and light touch intact throughout but subjective paraesthesias bilaterally  Deep Tendon Reflexes: 1+ and symmetric throughout but present Plantars: Right: downgoing   Left: downgoing Cerebellar: normal finger-to-nose,      Laboratory Studies:   Basic Metabolic Panel: Recent Labs  Lab 03/24/20 1438 03/25/20 1023 03/27/20 1029  NA 139 141 130*  K 4.5 4.2 4.0  CL 103 106 99  CO2 28 28 22   GLUCOSE 78 99 102*  BUN 11 10 9   CREATININE 0.59 0.61 0.47  CALCIUM 9.7 9.2 8.9    Liver Function Tests: Recent Labs  Lab 03/24/20 1438 03/25/20 1023  AST 15 16  ALT 13 15  ALKPHOS 87 78  BILITOT 0.6 1.0  PROT 7.0 7.2  ALBUMIN 4.1 3.7   Recent Labs  Lab 03/25/20 1023  LIPASE 26   No results for input(s): AMMONIA in the last 168 hours.  CBC: Recent Labs  Lab 03/25/20 0950 03/27/20 1029  WBC 5.4 5.7  NEUTROABS 3.8  --   HGB 13.5 12.8  HCT 40.8 38.3  MCV 92.1 90.1  PLT 228 226    Cardiac Enzymes: No results for input(s): CKTOTAL, CKMB, CKMBINDEX, TROPONINI in the last 168 hours.  BNP: Invalid input(s): POCBNP  CBG: No results for input(s): GLUCAP in the last 168 hours.  Microbiology: Results for orders placed or performed during the hospital encounter of 07/07/19  SARS CORONAVIRUS 2 (TAT 6-24 HRS) Nasopharyngeal Nasopharyngeal Swab     Status: None   Collection Time: 07/07/19  9:45 AM   Specimen: Nasopharyngeal Swab  Result Value Ref Range Status   SARS Coronavirus 2 NEGATIVE  NEGATIVE Final    Comment: (NOTE) SARS-CoV-2 target nucleic acids are NOT DETECTED. The SARS-CoV-2 RNA is generally detectable in upper and lower respiratory specimens during the acute phase of infection. Negative results do not preclude SARS-CoV-2 infection, do not rule out co-infections with other pathogens, and should not be used as the sole basis for treatment or other patient management decisions. Negative results must be combined with clinical observations, patient history, and epidemiological information. The expected result is Negative. Fact Sheet for Patients: SugarRoll.be Fact Sheet for Healthcare Providers: https://www.woods-mathews.com/ This test is not yet approved or cleared by the [place] FDA and  has been authorized for detection and/or diagnosis of SARS-CoV-2 by FDA under an Emergency Use Authorization (EUA). This EUA will remain  in effect (meaning this test can be used) for the duration of the COVID-19 declaration under Section 56 4(b)(1) of the Act,  21 U.S.C. section 360bbb-3(b)(1), unless the authorization is terminated or revoked sooner. Performed at [facility], [place] [address], [place], [place] [address]     Coagulation Studies: No results for input(s): LABPROT, INR in the last 72 hours.  Urinalysis:  Recent Labs  Lab 03/27/20 1159  COLORURINE STRAW*  LABSPEC 1.002*  PHURINE 7.0  GLUCOSEU NEGATIVE  HGBUR SMALL*  BILIRUBINUR NEGATIVE  KETONESUR NEGATIVE  PROTEINUR NEGATIVE  NITRITE NEGATIVE  LEUKOCYTESUR NEGATIVE    Lipid Panel:     Component Value Date/Time   CHOL 158 04/25/2019 0859   CHOL 156 04/11/2018 0900   TRIG 112.0 04/25/2019 0859   HDL 43.70 04/25/2019 0859   HDL 58 04/11/2018 0900   CHOLHDL 4 04/25/2019 0859   VLDL 22.4 04/25/2019 0859   LDLCALC 92 04/25/2019 0859   LDLCALC 84 04/11/2018 0900    HgbA1C:  Lab Results  Component Value Date   HGBA1C 6.1 03/24/2020     Urine Drug Screen:  No results found for: LABOPIA, COCAINSCRNUR, LABBENZ, AMPHETMU, THCU, LABBARB  Alcohol Level: No results for input(s): ETH in the last 168 hours.  Other results: EKG: normal EKG, normal sinus rhythm, unchanged from previous tracings.  Imaging: No results found.   Assessment/Plan:  79 y.o. female with a past medical history of CAD, pacemaker, gastric reflux, hypertension, hyperlipidemia, psoriasis, arthritis, presents to the emergency department for worsening numbness and tingling.  According to the patient she got her Enbrel injection approximately 1 week ago with total 2 doses first one being beginning of June.   She states since she has been experiencing tingling in her feet that has been progressing up her body that started last Wednesday.  Patient states initially she saw her doctor who diagnosed her with neuropathy however the numbness and tingling sensation continued to progress up her legs and into her upper extremities as well.  Today patient states she has been getting weaker in both upper and lower extremities along with paraesthesias ascending.  Pt was on Humara prior to Enbrel injections.    - Progressive numbness and weakness, diminishe reflexes.  - One case report found from 2017 with similar presentation but in setting of Humara use which was associated with GBS - B12 level ordered - TSH - LP to look at cells specifically WBC, glucose and protein level. If normal cells and elevated protein this would be consistent with GBS - treatment depending on LP - I think she needs to come in.  - Will follow LP results - can't obtain MRI due to PPM  Addendum: Really appreciate Dr. Kerman Passey for doing the LP On LP pt has normal WBC and protein only of 33. I really do not think this is GBS.  Definition of GBS is having cytoalbuminological dissociation with elevated protein and normal cells.   Hydration Hold off IVIG at this time Observation   03/27/2020,  12:39 PM

## 2020-03-27 NOTE — ED Notes (Signed)
Lumbar Puncture is complete. Patient has been instructed to remain supine for 30 to 45 minutes.

## 2020-03-27 NOTE — ED Notes (Signed)
Patient transported to CT at this time. 

## 2020-03-27 NOTE — ED Provider Notes (Signed)
[facility] Emergency Department Provider Note  Time seen: 11:36 AM  I have reviewed the triage vital signs and the nursing notes.   HISTORY  Chief Complaint Weakness   HPI Alison Huffman is a 79 y.o. female with a past medical history of CAD, pacemaker, gastric reflux, hypertension, hyperlipidemia, psoriasis, arthritis, presents to the emergency department for worsening numbness and tingling.  According to the patient she got her Enbrel injection approximately 1 week ago.  She states since she has been experiencing tingling in her feet that has been progressing up her body.  Patient states initially she saw her doctor who diagnosed her with neuropathy however the numbness and tingling sensation continued to progress up her legs and into her upper extremities as well.  Patient was seen in the emergency department for the same 2 days ago and again diagnoses most consistent with neuropathy.  Did not appear to fit a transverse myelitis or Guillain-Barr distribution/clinical picture.  Here patient states the numbness has continued and is now on part of her face bilaterally in her ears.  States today she was having difficulty lifting her arms, and needed to manually assist her right arm with her left arm to lift it high enough to hang her robe.  States she has never had this issue in the past.   Due to the progressing numbness and now weakness in her upper extremity she presents to the emergency department for evaluation.  Past Medical History:  Diagnosis Date  . Ankle pain, right   . Atypical chest pain    a. 10/2018 MV: EF 57%. No ischemia/infarct.  . Coronary artery disease   . Diastolic dysfunction    a. 04/2018 Echo: EF 55-60%, no rwma, Gr1 DD. Triv AI, mild MR. Mod dil LA. Nl RV fxn. PASP nl.  . GERD (gastroesophageal reflux disease)   . Headache    "dull one sometimes daily, at least weekly in last couple months" (07/22/2018)  . Hyperlipidemia   . Hypertension   .  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)   . Joint pain in fingers of right hand   . Lichen sclerosus   . Osteopenia   . Pneumonia 2012? X 1  . 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 Psoriasis   . Psoriatic arthritis ([place])    " dx'd the 1st of this year, 2019" (07/22/2018)  . Psoriatic arthritis ([place])   . Rosacea   . Seborrheic keratosis   . Second degree heart block    a. 07/2018 s/p SJM Assurity MRI model PM2271 (Ser# [id number]).  . Sleep apnea    "using nasal pilllows since ~ 12/2017" (07/22/2018)  . TMJ (dislocation of temporomandibular joint)     Patient Active Problem List   Diagnosis Date Noted  . Tingling in extremities 03/24/2020  . Inversion of right nipple 10/01/2019  . Breast cancer screening 09/22/2019  . Constipation 03/24/2019  . Cyst of left ovary 01/15/2019  . History of gastroesophageal reflux (GERD) 12/12/2018  . Postmenopausal bleeding 09/30/2018  . Thickened endometrium 09/30/2018  . Bilateral shoulder pain 09/20/2018  . Bradycardia 07/22/2018  . Second degree AV block, Mobitz type II 07/22/2018  . Exertional dyspnea 03/21/2018  . Greater trochanteric bursitis of both hips 12/18/2017  . Encounter for long-term (current) use of high-risk medication 12/10/2017  . Psoriasis 09/27/2017  . Prediabetes 09/19/2017  . Stiffness of right hand joint 08/28/2017  . Leg swelling 08/28/2017  . 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 Allergic rhinitis 06/14/2017  .  Ankle pain, right 06/14/2017  . History of carotid stenosis 06/14/2017  . Psoriatic arthritis ([place]) 06/14/2017  . Lichen sclerosus   . GERD (gastroesophageal reflux disease)   . Hypertension 03/02/2017  . Hyperlipidemia 03/02/2017  . OSA (obstructive sleep apnea) 03/02/2017  . Eye exam, routine 03/02/2017  . Osteoporosis 03/02/2017    Past Surgical History:  Procedure Laterality Date  . APPENDECTOMY    . BREAST CYST ASPIRATION Bilateral   . BREAST CYST EXCISION Right 1978   benign  . COLONOSCOPY     . COLONOSCOPY WITH PROPOFOL N/A 07/10/2019   Procedure: COLONOSCOPY WITH PROPOFOL;  Surgeon: Lollie Sails, MD;  Location: [facility] ENDOSCOPY;  Service: Endoscopy;  Laterality: N/A;  . ESOPHAGOGASTRODUODENOSCOPY (EGD) WITH PROPOFOL N/A 07/10/2019   Procedure: ESOPHAGOGASTRODUODENOSCOPY (EGD) WITH PROPOFOL;  Surgeon: Lollie Sails, MD;  Location: [facility] ENDOSCOPY;  Service: Endoscopy;  Laterality: N/A;  . HYSTEROSCOPY WITH D & C N/A 11/05/2018   Procedure: DILATATION AND CURETTAGE /HYSTEROSCOPY;  Surgeon: Homero Fellers, MD;  Location: [facility] ORS;  Service: Gynecology;  Laterality: N/A;  . INSERT / REPLACE / REMOVE PACEMAKER    . 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 UTERINE POLYPS REMOVAL      Prior to Admission medications   Medication Sig Start Date End Date Taking? Authorizing Provider  acetaminophen (TYLENOL) 325 MG tablet Take 325-650 mg by mouth every 6 (six) hours as needed for mild pain or headache.     [provider]  Calcium Citrate (CITRACAL PO) Take 1 Dose by mouth daily.    [provider]  Cholecalciferol (VITAMIN D3) 50 MCG (2000 UT) TABS Take 2,000 Units by mouth daily.    [provider]  Clobetasol Prop Emollient Base (CLOBETASOL PROPIONATE E) 0.05 % emollient cream Apply 1 application topically every Sunday.     [provider]  desonide (DESOWEN) 0.05 % ointment Apply 1 application topically 2 (two) times daily as needed (for psoriasis).     [provider]  ENBREL SURECLICK 50 MG/ML injection Inject into the skin once a week. Patient not taking: Reported on 03/24/2020 02/19/20   [provider]  famotidine (PEPCID) 20 MG tablet Take 20 mg by mouth 2 (two) times daily.    [provider]  Methylcellulose, Laxative, (CITRUCEL PO) Take 1 Dose by mouth.    [provider]  metroNIDAZOLE (METROCREAM) 0.75 % cream Apply  topically 2 (two) times daily as needed (facial redness/irritation.).     [provider]  nystatin cream (MYCOSTATIN) Apply 1 application topically 2 (two) times daily as needed for dry skin.     [provider]  rosuvastatin (CRESTOR) 5 MG tablet Take 1 tablet (5 mg total) by mouth daily. 04/29/19 07/28/19  Minna Merritts, MD  tacrolimus (PROTOPIC) 0.1 % ointment Apply topically 2 (two) times daily as needed (for psoriasis).     [provider]  valsartan (DIOVAN) 80 MG tablet TAKE 1 TABLET DAILY 11/05/19   Leone Haven, MD    Allergies  Allergen Reactions  . Nickel     Family History  Problem Relation Age of Onset  . Stroke Mother   . Atrial fibrillation Mother   . Breast cancer Mother   . Stroke Father   . Breast cancer Maternal Aunt   . Breast cancer Paternal Aunt     Social History Social History   Tobacco Use  .  Smoking status: Former Smoker    Types: Cigarettes  . Smokeless tobacco: Never Used  . Tobacco comment: 07/22/2018 "smoked some in college; 1960s;  nothing since"  Vaping Use  . Vaping Use: Never used  Substance Use Topics  . Alcohol use: Yes    Comment: rarely  . Drug use: Never    Review of Systems Constitutional: Negative for fever Cardiovascular: Negative for chest pain. Respiratory: Negative for shortness of breath. Gastrointestinal: Negative for abdominal pain, vomiting Genitourinary: Negative for urinary compaints Musculoskeletal: Did state neck pain 1 week ago but largely resolved Skin: Negative for skin complaints  Neurological: Negative for headache.  Positive for numbness in her lower and upper extremities and now in her lower face bilaterally.  Today started with weakness in bilateral upper extremities. All other ROS negative  ____________________________________________   PHYSICAL EXAM:  VITAL SIGNS: ED Triage Vitals  Enc Vitals Group     BP 03/27/20 1030 140/73     Pulse Rate 03/27/20 1030 81      Resp 03/27/20 1030 (!) 21     Temp 03/27/20 1030 98.2 F (36.8 C)     Temp Source 03/27/20 1030 Oral     SpO2 03/27/20 1030 94 %     Weight 03/27/20 1024 168 lb (76.2 kg)     Height 03/27/20 1024 5\' 6"  (1.676 m)     Head Circumference --      Peak Flow --      Pain Score 03/27/20 1024 0     Pain Loc --      Pain Edu? --      Excl. in [place]? --    Constitutional: Alert and oriented. Well appearing and in no distress. Eyes: Normal exam ENT      Head: Normocephalic and atraumatic.      Mouth/Throat: Mucous membranes are moist. Cardiovascular: Normal rate, regular rhythm.  Respiratory: Normal respiratory effort without tachypnea nor retractions. Breath sounds are clear  Gastrointestinal: Soft and nontender. No distention.  Musculoskeletal: Nontender with normal range of motion in all extremities.  Neurologic:  Normal speech and language.  Patient has 5/5 strength in bilateral lower extremities but states diminished sensation bilaterally.  Patient has good grip strength bilaterally but is unable to maintain her arms elevated off the bed for more than 5 or 6 seconds.  Is unable to lift her arms past 90 degrees.  States diminished sensation in bilateral upper extremities.  States similar sensation in her bilateral face besides slight numb sensation to her right cheek.  No objective cranial nerve deficits. Skin:  Skin is warm, dry and intact.  Psychiatric: Mood and affect are normal.   ____________________________________________    EKG  EKG viewed and interpreted by myself shows a sinus rhythm at 74 bpm with a widened QRS, normal axis, largely normal intervals, nonspecific ST changes with inferolateral T wave inversions.  Which is a change compared to 04/29/2019.  ____________________________________________    RADIOLOGY  CT scans largely negative.  ____________________________________________   INITIAL IMPRESSION / ASSESSMENT AND PLAN / ED COURSE  Pertinent labs & imaging results  that were available during my care of the patient were reviewed by me and considered in my medical decision making (see chart for details).   Patient presents emergency department for progressing weakness numbness tingling symptoms over the past 1 week since receiving Enbrel injection.  Not entirely clear the cause of the patient's symptoms.  We will check labs.  I do believe this is concerning for  a progressing central neurological condition in the brain or cervical spine, unfortunately patient has a pacemaker so she is not MRI compatible at least at our institution.  Patient does have T wave inversions in the lateral leads that appears to be somewhat changed compared to a year ago.  I have added on cardiac enzymes.  We will obtain CT angio of the head and neck to further evaluate.  Patient agreeable to plan.  CT scans are largely negative.  I have consulted neurology who has been down to see the patient and recommends lumbar puncture and admission to the medical service.  I was able to perform the lumbar puncture without complication.  We will discussed with hospitalist for admission for concern for possible Guillain-Barr.  Neurology will be consulting.  Alison Huffman was evaluated in Emergency Department on 03/27/2020 for the symptoms described in the history of present illness. She was evaluated in the context of the global COVID-19 pandemic, which necessitated consideration that the patient might be at risk for infection with the SARS-CoV-2 virus that causes COVID-19. Institutional protocols and algorithms that pertain to the evaluation of patients at risk for COVID-19 are in a state of rapid change based on information released by regulatory bodies including the CDC and federal and state organizations. These policies and algorithms were followed during the patient's care.  LUMBAR PUNCTURE  Date/Time: 03/27/2020 at 2:01 PM Performed by: Har[facility] Dark  Consent: Verbal consent obtained.  Risks  and benefits: risks, benefits and alternatives were discussed Consent given by: Patient Patient understanding: patient states understanding of the procedure being performed  Patient consent: the patient's understanding of the procedure matches consent given  Procedure consent: procedure consent matches procedure scheduled  Relevant documents: relevant documents present and verified  Test results: test results available and properly labeled Site marked: the operative site was marked Imaging studies: imaging studies available  Required items: required blood products, implants, devices, and special equipment available  Patient identity confirmed: verbally with patient and arm band  Time out: Immediately prior to procedure a "time out" was called to verify the correct patient, procedure, equipment, support staff and site/side marked as required.  Indications: Evaluation for ascending weakness Anesthesia: local infiltration Local anesthetic: lidocaine 1% without epinephrine Anesthetic total: 5 ml Patient sedated: No Analgesia: None Preparation: Patient was prepped and draped in the usual sterile fashion. Lumbar space: L3-L4 interspace Patient's position: Right lateral decubitus Needle gauge: 20 Needle length: 3.5 in Number of attempts: 1 Fluid appearance: Clear Tubes of fluid: 4 Total volume: 8 ml Post-procedure: site cleaned and adhesive bandage applied Patient tolerance: Patient tolerated the procedure well with no immediate complications   ____________________________________________   FINAL CLINICAL IMPRESSION(S) / ED DIAGNOSES  Merrilyn Puma, MD 03/27/20 1402

## 2020-03-28 DIAGNOSIS — R202 Paresthesia of skin: Secondary | ICD-10-CM | POA: Diagnosis not present

## 2020-03-28 DIAGNOSIS — E871 Hypo-osmolality and hyponatremia: Secondary | ICD-10-CM | POA: Diagnosis not present

## 2020-03-28 DIAGNOSIS — Z823 Family history of stroke: Secondary | ICD-10-CM | POA: Diagnosis not present

## 2020-03-28 DIAGNOSIS — T50995A Adverse effect of other drugs, medicaments and biological substances, initial encounter: Secondary | ICD-10-CM | POA: Diagnosis not present

## 2020-03-28 DIAGNOSIS — Z803 Family history of malignant neoplasm of breast: Secondary | ICD-10-CM | POA: Diagnosis not present

## 2020-03-28 DIAGNOSIS — W19XXXA Unspecified fall, initial encounter: Secondary | ICD-10-CM | POA: Diagnosis present

## 2020-03-28 DIAGNOSIS — I10 Essential (primary) hypertension: Secondary | ICD-10-CM

## 2020-03-28 DIAGNOSIS — X58XXXA Exposure to other specified factors, initial encounter: Secondary | ICD-10-CM | POA: Diagnosis present

## 2020-03-28 DIAGNOSIS — J9811 Atelectasis: Secondary | ICD-10-CM | POA: Diagnosis present

## 2020-03-28 DIAGNOSIS — Y92239 Unspecified place in hospital as the place of occurrence of the external cause: Secondary | ICD-10-CM | POA: Diagnosis not present

## 2020-03-28 DIAGNOSIS — N83292 Other ovarian cyst, left side: Secondary | ICD-10-CM | POA: Diagnosis present

## 2020-03-28 DIAGNOSIS — R262 Difficulty in walking, not elsewhere classified: Secondary | ICD-10-CM | POA: Diagnosis present

## 2020-03-28 DIAGNOSIS — T394X5A Adverse effect of antirheumatics, not elsewhere classified, initial encounter: Secondary | ICD-10-CM | POA: Diagnosis present

## 2020-03-28 DIAGNOSIS — Z20822 Contact with and (suspected) exposure to covid-19: Secondary | ICD-10-CM | POA: Diagnosis present

## 2020-03-28 DIAGNOSIS — R200 Anesthesia of skin: Secondary | ICD-10-CM | POA: Diagnosis not present

## 2020-03-28 DIAGNOSIS — I4510 Unspecified right bundle-branch block: Secondary | ICD-10-CM | POA: Diagnosis present

## 2020-03-28 DIAGNOSIS — Z950 Presence of cardiac pacemaker: Secondary | ICD-10-CM | POA: Diagnosis not present

## 2020-03-28 DIAGNOSIS — K219 Gastro-esophageal reflux disease without esophagitis: Secondary | ICD-10-CM | POA: Diagnosis not present

## 2020-03-28 DIAGNOSIS — I2510 Atherosclerotic heart disease of native coronary artery without angina pectoris: Secondary | ICD-10-CM | POA: Diagnosis present

## 2020-03-28 DIAGNOSIS — G4733 Obstructive sleep apnea (adult) (pediatric): Secondary | ICD-10-CM | POA: Diagnosis not present

## 2020-03-28 DIAGNOSIS — I441 Atrioventricular block, second degree: Secondary | ICD-10-CM | POA: Diagnosis present

## 2020-03-28 DIAGNOSIS — G619 Inflammatory polyneuropathy, unspecified: Secondary | ICD-10-CM | POA: Diagnosis present

## 2020-03-28 DIAGNOSIS — E785 Hyperlipidemia, unspecified: Secondary | ICD-10-CM | POA: Diagnosis present

## 2020-03-28 DIAGNOSIS — R531 Weakness: Secondary | ICD-10-CM | POA: Diagnosis present

## 2020-03-28 DIAGNOSIS — M1990 Unspecified osteoarthritis, unspecified site: Secondary | ICD-10-CM | POA: Diagnosis present

## 2020-03-28 DIAGNOSIS — K5900 Constipation, unspecified: Secondary | ICD-10-CM | POA: Diagnosis present

## 2020-03-28 DIAGNOSIS — Z7401 Bed confinement status: Secondary | ICD-10-CM | POA: Diagnosis not present

## 2020-03-28 DIAGNOSIS — G6289 Other specified polyneuropathies: Secondary | ICD-10-CM | POA: Diagnosis not present

## 2020-03-28 DIAGNOSIS — M8580 Other specified disorders of bone density and structure, unspecified site: Secondary | ICD-10-CM | POA: Diagnosis present

## 2020-03-28 DIAGNOSIS — Z79899 Other long term (current) drug therapy: Secondary | ICD-10-CM | POA: Diagnosis not present

## 2020-03-28 DIAGNOSIS — L4050 Arthropathic psoriasis, unspecified: Secondary | ICD-10-CM | POA: Diagnosis present

## 2020-03-28 DIAGNOSIS — Z87891 Personal history of nicotine dependence: Secondary | ICD-10-CM | POA: Diagnosis not present

## 2020-03-28 LAB — CBC
HCT: 39.8 % (ref 36.0–46.0)
Hemoglobin: 13.7 g/dL (ref 12.0–15.0)
MCH: 30.4 pg (ref 26.0–34.0)
MCHC: 34.4 g/dL (ref 30.0–36.0)
MCV: 88.2 fL (ref 80.0–100.0)
Platelets: 223 10*3/uL (ref 150–400)
RBC: 4.51 MIL/uL (ref 3.87–5.11)
RDW: 12.5 % (ref 11.5–15.5)
WBC: 5.5 10*3/uL (ref 4.0–10.5)
nRBC: 0 % (ref 0.0–0.2)

## 2020-03-28 LAB — BASIC METABOLIC PANEL
Anion gap: 9 (ref 5–15)
BUN: 10 mg/dL (ref 8–23)
CO2: 24 mmol/L (ref 22–32)
Calcium: 9 mg/dL (ref 8.9–10.3)
Chloride: 98 mmol/L (ref 98–111)
Creatinine, Ser: 0.5 mg/dL (ref 0.44–1.00)
GFR calc Af Amer: >60 mL/min (ref >60)
GFR calc non Af Amer: >60 mL/min (ref >60)
Glucose, Bld: 97 mg/dL (ref 70–99)
Potassium: 3.9 mmol/L (ref 3.5–5.1)
Sodium: 131 mmol/L — ABNORMAL LOW (ref 135–145)

## 2020-03-28 MED ORDER — FLEET ENEMA 7-19 GM/118ML RE ENEM
1.0000 | ENEMA | Freq: Once | RECTAL | Status: AC
  Administered 2020-03-28: 1 via RECTAL

## 2020-03-28 MED ORDER — SODIUM CHLORIDE 0.9 % IV SOLN: INTRAVENOUS | Status: DC

## 2020-03-28 NOTE — Plan of Care (Signed)
  Problem: Education: Goal: Knowledge of General Education information will improve Description: Including pain rating scale, medication(s)/side effects and non-pharmacologic comfort measures Outcome: Progressing   Problem: Clinical Measurements: Goal: Will remain free from infection Outcome: Progressing Goal: Respiratory complications will improve Outcome: Progressing Goal: Cardiovascular complication will be avoided Outcome: Progressing   Problem: Coping: Goal: Level of anxiety will decrease Outcome: Progressing   Problem: Activity: Goal: Risk for activity intolerance will decrease Outcome: Not Progressing

## 2020-03-28 NOTE — Progress Notes (Signed)
Patient a&o, VSS. No complaints of pain or acute distress. Patient had an assisted fall this morning. No changes to patient's condition before and after fall. Dr Cathlean Sauer was notified at time of fall, no new orders were placed. No signs of injury upon assessment, and no complaints from patient currently. Patient did not want me to call any family and notify them. Patient stable, no acute changes.

## 2020-03-28 NOTE — Progress Notes (Addendum)
PROGRESS NOTE    Alison Huffman  WHQ:[id number] DOB: November 01, 1940 DOA: 03/27/2020 PCP: Leone Haven, MD    Brief Narrative:  Patient admitted to the hospital with working diagnosis of generalized paresthesias.  79 year old female with past medical history for coronary artery disease, GERD, hypertension, psoriasis, and arthritis.  She presents with  progressive numbness and weakness.  She reported progressive ascending weakness, initially lower extremities then involving the upper extremities. Her symptoms appear 2 days after her second dose of Enberl (03/15/20).  As an outpatient she was diagnosed with neuropathy.  She presented to the ED due to persistent and worsening symptoms. On her initial physical examination blood pressure 142/73, heart rate 83, respiratory rate 16, oxygen saturation 96%.  Her lungs were clear to auscultation bilaterally, heart S1-S2 present and rhythmic, abdomen soft, no lower extremity edema.  Strength was preserved. Sodium 130, potassium 4.0, chloride 99, bicarb 22, glucose 102, BUN 9, creatinine 0.47, white count 5.7, hemoglobin 12.8, hematocrit 38.3, platelets 226.  SARS COVID-19 negative.  Urinalysis 0-5 white cells, specific gravity 1.002.  Spinal fluid 5 red cells, 0 white cells, protein 33, glucose 53.  CT head/neck, angiography with no acute changes. EKG 74 bpm, normal axis, right bundle branch block, sinus rhythm, no ST segment changes, negative T wave lead III, aVF, and V4 through V6.  Assessment & Plan:   Principal Problem:   Acute motor and sensory axonal neuropathy Active Problems:   Hypertension   OSA (obstructive sleep apnea)   GERD (gastroesophageal reflux disease)   Numbness and tingling of lower extremity   Numbness and tingling of both feet   1. Acute motor and sensory neuropathy, in the setting of Enblrel use. Lumbar puncture with 33 protein. Patient continue to have significant ambulatory dysfunction, this am was not able to stand by  herself and required significant assistance. B12 323.   Considering patient's degree of ambulatory dysfunction, and rapid progression I consider not safe her discharge home at this point in time. Continue fall precautions, pending PT and OT evaluation. Positive constipation, will continue bowel regimen and will add fleet enema x1.   Continue to follow up with neurology recommendations.   2. HTN. Continue blood pressure control with valsartan.   3. GERD. Continue with antiacid therapy.   4. Psoriasis. Will follow as outpatient.   5. Dyslipidemia. Will hold on statin for now, check CK.   6. Hyponatremia. Serum Na down to 131, patient with poor oral intake, will add gentle hydration with isotonic saline.   Follow up renal panel in am.   Patient continue to be at high risk for falls and worsening ambulatory dysfunction   Status is: Observation  The patient will require care spanning > 2 midnights and should be moved to inpatient because: Unsafe d/c plan  Dispo: The patient is from: Home              Anticipated d/c is to: SNF              Anticipated d/c date is: 1 day              Patient currently is not medically stable to d/c.   DVT prophylaxis: Enoxaparin   Code Status:   full  Family Communication:  No family at the bedside       Consultants:   Neurology   Procedures:  06/26 lumbar puncnture   Subjective: Patient continue to have persistent significant lower extremity weakness, not able to ambulate by herself.  Severe in intensity, worse with movement and no improving factors. Not back to her baseline.   Objective: Vitals:   03/28/20 0647 03/28/20 0756 03/28/20 0951 03/28/20 1213  BP: (!) 147/66 (!) 137/100 (!) 143/68 (!) 141/76  Pulse: (!) 56 83 80 68  Resp: 16 18 17 16   Temp: 97.7 F (36.5 C) (!) 97.5 F (36.4 C) 97.7 F (36.5 C) 97.7 F (36.5 C)  TempSrc: Oral Oral Oral Oral  SpO2: 96% 98% 96% 97%  Weight:      Height:        Intake/Output Summary  (Last 24 hours) at 03/28/2020 1222 Last data filed at 03/28/2020 1026 Gross per 24 hour  Intake 360 ml  Output --  Net 360 ml   Filed Weights   03/27/20 1024  Weight: 76.2 kg    Examination:   General: Not in pain or dyspnea, deconditioned and ill looking appearing  Neurology: Awake and alert, non focal  E ENT: mild pallor, no icterus, oral mucosa moist Cardiovascular: No JVD. S1-S2 present, rhythmic, no gallops, rubs, or murmurs. No lower extremity edema. Pulmonary: positive breath sounds bilaterally, no wheezing, rhonchi or rales. Gastrointestinal. Abdomen with no organomegaly, non tender, no rebound or guarding Skin. No rashes Musculoskeletal: no joint deformities     Data Reviewed: I have personally reviewed following labs and imaging studies  CBC: Recent Labs  Lab 03/25/20 0950 03/27/20 1029 03/28/20 0548  WBC 5.4 5.7 5.5  NEUTROABS 3.8  --   --   HGB 13.5 12.8 13.7  HCT 40.8 38.3 39.8  MCV 92.1 90.1 88.2  PLT 228 226 979   Basic Metabolic Panel: Recent Labs  Lab 03/24/20 1438 03/25/20 1023 03/27/20 1029 03/28/20 0548  NA 139 141 130* 131*  K 4.5 4.2 4.0 3.9  CL 103 106 99 98  CO2 28 28 22 24   GLUCOSE 78 99 102* 97  BUN 11 10 9 10   CREATININE 0.59 0.61 0.47 0.50  CALCIUM 9.7 9.2 8.9 9.0   GFR: Estimated Creatinine Clearance: 58.5 mL/min (by C-G formula based on SCr of 0.5 mg/dL). Liver Function Tests: Recent Labs  Lab 03/24/20 1438 03/25/20 1023  AST 15 16  ALT 13 15  ALKPHOS 87 78  BILITOT 0.6 1.0  PROT 7.0 7.2  ALBUMIN 4.1 3.7   Recent Labs  Lab 03/25/20 1023  LIPASE 26   No results for input(s): AMMONIA in the last 168 hours. Coagulation Profile: No results for input(s): INR, PROTIME in the last 168 hours. Cardiac Enzymes: No results for input(s): CKTOTAL, CKMB, CKMBINDEX, TROPONINI in the last 168 hours. BNP (last 3 results) No results for input(s): PROBNP in the last 8760 hours. HbA1C: No results for input(s): HGBA1C in the  last 72 hours. CBG: No results for input(s): GLUCAP in the last 168 hours. Lipid Profile: No results for input(s): CHOL, HDL, LDLCALC, TRIG, CHOLHDL, LDLDIRECT in the last 72 hours. Thyroid Function Tests: Recent Labs    03/27/20 1247  TSH 1.312   Anemia Panel: Recent Labs    03/27/20 1247  [id number]      Radiology Studies: I have reviewed all of the imaging during this hospital visit personally     Scheduled Meds: . cholecalciferol  2,000 Units Oral Daily  . clobetasol cream  1 application Topical Q Sun  . enoxaparin (LOVENOX) injection  40 mg Subcutaneous Q24H  . famotidine  20 mg Oral BID  . irbesartan  75 mg Oral Daily  . psyllium  1  packet Oral Daily  . sodium chloride flush  3 mL Intravenous Q12H   Continuous Infusions: . sodium chloride       LOS: 0 days        Kayode Petion Gerome Apley, MD

## 2020-03-28 NOTE — Progress Notes (Signed)
   03/28/20 0941  Adult Fall Risk Assessment  Risk Factor Category (scoring not indicated) Fall has occurred during this admission (document High fall risk)  Patient Fall Risk Level High fall risk  Adult Fall Risk Interventions  Required Bundle Interventions *See Row Information* High fall risk - low, moderate, and high requirements implemented  Additional Interventions Use of appropriate toileting equipment (bedpan, BSC, etc.);PT/OT need assessed if change in mobility from baseline  Screening for Fall Injury Risk (To be completed on HIGH fall risk patients) - Assessing Need for Low Bed  Risk For Fall Injury- Low Bed Criteria Previous fall this admission  Will Implement Low Bed and Floor Mats Yes  Pain Assessment  Pain Scale 0-10  Pain Score 0  Neurological  Neuro (WDL) WDL  Level of Consciousness Alert  Orientation Level Oriented X4  Cognition Appropriate at baseline;Appropriate attention/concentration;Appropriate judgement;Appropriate safety awareness;Appropriate for developmental age;Follows commands;No memory impairment  Speech Clear  Pupil Assessment  No  Motor Function/Sensation Assessment Motor response;Sensation;Motor strength  Facial Symmetry Symmetrical  R Hand Grip Moderate  L Hand Grip Moderate   RUE Motor Response Purposeful movement  RUE Sensation Full sensation  RUE Motor Strength 5  LUE Motor Response Purposeful movement  LUE Sensation Full sensation  LUE Motor Strength 5  RLE Motor Response Purposeful movement  RLE Sensation Numbness;Tingling  RLE Motor Strength 4  LLE Motor Response Purposeful movement  LLE Sensation Numbness;Tingling  LLE Motor Strength 4  Musculoskeletal  Musculoskeletal (WDL) X  Assistive Device BSC;Front wheel walker  Generalized Weakness Yes  Weight Bearing Restrictions No  Integumentary  Integumentary (WDL) WDL  Skin Color Appropriate for ethnicity  Skin Condition Dry  Skin Integrity Intact  Pain Screening  Clinical Progression  Not changed

## 2020-03-28 NOTE — Progress Notes (Signed)
Patient had an assited fall to the floor by [employer]. She was transferring to the bsc from the bed. Patient states her legs gave out, tech was holding on to patient and assisted patient to the floor. VSS. Did not hit her head and no injuries or pain. Patient assisted back to bed. MD and [facility] made aware. Patient refuses to call family or let family know. Post fall huddle performed.

## 2020-03-28 NOTE — Progress Notes (Signed)
Patient transferred from bed to chair 2 assist for safety. Patient tolerated well. Chair alarm in place and on, call bell and phone in reach.

## 2020-03-28 NOTE — Progress Notes (Signed)
   03/28/20 0750  What Happened  Was fall witnessed? Yes  Who witnessed fall? Bobetta Lime NT  Patients activity before fall bathroom-assisted (transferring from bed to bsc)  Point of contact buttocks  Was patient injured? No  Follow Up  MD notified Dr Cathlean Sauer  Time MD notified [phone]  Family notified No - patient refusal  Additional tests No  Simple treatment Other (comment) (no pain, no injuries)  Progress note created (see row info) Yes  Adult Fall Risk Assessment  Risk Factor Category (scoring not indicated) Fall has occurred during this admission (document High fall risk)  Patient Fall Risk Level High fall risk  Adult Fall Risk Interventions  Required Bundle Interventions *See Row Information* High fall risk - low, moderate, and high requirements implemented  Additional Interventions Use of appropriate toileting equipment (bedpan, BSC, etc.);PT/OT need assessed if change in mobility from baseline  Screening for Fall Injury Risk (To be completed on HIGH fall risk patients) - Assessing Need for Low Bed  Risk For Fall Injury- Low Bed Criteria Previous fall this admission  Will Implement Low Bed and Floor Mats Yes

## 2020-03-28 NOTE — Progress Notes (Signed)
Subjective: No changes overnight. Still significant sensory changes. Pt is s/p fall.     Past Medical History:  Diagnosis Date  . Ankle pain, right   . Atypical chest pain    a. 10/2018 MV: EF 57%. No ischemia/infarct.  . Coronary artery disease   . Diastolic dysfunction    a. 04/2018 Echo: EF 55-60%, no rwma, Gr1 DD. Triv AI, mild MR. Mod dil LA. Nl RV fxn. PASP nl.  . GERD (gastroesophageal reflux disease)   . Headache    "dull one sometimes daily, at least weekly in last couple months" (07/22/2018)  . Hyperlipidemia   . Hypertension   . 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 Joint pain in fingers of right hand   . Lichen sclerosus   . Osteopenia   . Pneumonia 2012? X 1  . 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 Psoriasis   . Psoriatic arthritis ([place])    " dx'd the 1st of this year, 2019" (07/22/2018)  . Psoriatic arthritis ([place])   . Rosacea   . Seborrheic keratosis   . Second degree heart block    a. 07/2018 s/p SJM Assurity MRI model [id number] (Ser# [id number]).  . Sleep apnea    "using nasal pilllows since ~ 12/2017" (07/22/2018)  . TMJ (dislocation of temporomandibular joint)     Past Surgical History:  Procedure Laterality Date  . APPENDECTOMY    . BREAST CYST ASPIRATION Bilateral   . BREAST CYST EXCISION Right 1978   benign  . COLONOSCOPY    . COLONOSCOPY WITH PROPOFOL N/A 07/10/2019   Procedure: COLONOSCOPY WITH PROPOFOL;  Surgeon: Lollie Sails, MD;  Location: [facility] ENDOSCOPY;  Service: Endoscopy;  Laterality: N/A;  . ESOPHAGOGASTRODUODENOSCOPY (EGD) WITH PROPOFOL N/A 07/10/2019   Procedure: ESOPHAGOGASTRODUODENOSCOPY (EGD) WITH PROPOFOL;  Surgeon: Lollie Sails, MD;  Location: [facility] ENDOSCOPY;  Service: Endoscopy;  Laterality: N/A;  . HYSTEROSCOPY WITH D & C N/A 11/05/2018   Procedure: DILATATION AND CURETTAGE /HYSTEROSCOPY;  Surgeon: Homero Fellers, MD;  Location: [facility] ORS;  Service: Gynecology;  Laterality: N/A;  . INSERT / REPLACE / REMOVE  PACEMAKER    . 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 UTERINE POLYPS REMOVAL      Family History  Problem Relation Age of Onset  . Stroke Mother   . Atrial fibrillation Mother   . Breast cancer Mother   . Stroke Father   . Breast cancer Maternal Aunt   . Breast cancer Paternal Aunt     Social History:  reports that she has quit smoking. Her smoking use included cigarettes. She has never used smokeless tobacco. She reports current alcohol use. She reports that she does not use drugs.  Allergies  Allergen Reactions  . Nickel     Medications: I have reviewed the patient's current medications.    Physical Examination: Blood pressure (!) 141/76, pulse 68, temperature 97.7 F (36.5 C), temperature source Oral, resp. rate 16, height 5\' 6"  (1.676 m), weight 76.2 kg, SpO2 97 %.   Neurological Examination   Mental Status: Alert, oriented, thought content appropriate.  Speech fluent without evidence of aphasia.  Able to follow 3 step commands without difficulty. Cranial Nerves: II:  Visual fields grossly normal, pupils equal, round, reactive to light and accommodation III,IV, VI: ptosis not present, extra-ocular motions intact bilaterally V,VII: smile symmetric, facial light touch sensation normal bilaterally VIII: hearing normal bilaterally IX,X: gag  reflex present XI: bilateral shoulder shrug XII: midline tongue extension Motor: Generalized weakness with 4/5 bilaterally Sensory: Pinprick and light touch intact throughout but subjective paraesthesias bilaterally  Deep Tendon Reflexes: 1+ in UE and diminished in LE Plantars: Right: downgoing   Left: downgoing Cerebellar: normal finger-to-nose,      Laboratory Studies:   Basic Metabolic Panel: Recent Labs  Lab 03/24/20 1438 03/24/20 1438 03/25/20 1023 03/27/20 1029 03/28/20 0548  NA 139  --  141 130* 131*  K 4.5  --  4.2 4.0 3.9  CL 103   --  106 99 98  CO2 28  --  28 22 24   GLUCOSE 78  --  99 102* 97  BUN 11  --  10 9 10   CREATININE 0.59  --  0.61 0.47 0.50  CALCIUM 9.7   < > 9.2 8.9 9.0   < > = values in this interval not displayed.    Liver Function Tests: Recent Labs  Lab 03/24/20 1438 03/25/20 1023  AST 15 16  ALT 13 15  ALKPHOS 87 78  BILITOT 0.6 1.0  PROT 7.0 7.2  ALBUMIN 4.1 3.7   Recent Labs  Lab 03/25/20 1023  LIPASE 26   No results for input(s): AMMONIA in the last 168 hours.  CBC: Recent Labs  Lab 03/25/20 0950 03/27/20 1029 03/28/20 0548  WBC 5.4 5.7 5.5  NEUTROABS 3.8  --   --   HGB 13.5 12.8 13.7  HCT 40.8 38.3 39.8  MCV 92.1 90.1 88.2  PLT 228 226 223    Cardiac Enzymes: No results for input(s): CKTOTAL, CKMB, CKMBINDEX, TROPONINI in the last 168 hours.  BNP: Invalid input(s): POCBNP  CBG: No results for input(s): GLUCAP in the last 168 hours.  Microbiology: Results for orders placed or performed during the hospital encounter of 03/27/20  SARS Coronavirus 2 by RT PCR (hospital order, performed in [facility] hospital lab) Nasopharyngeal Nasopharyngeal Swab     Status: None   Collection Time: 03/27/20  1:55 PM   Specimen: Nasopharyngeal Swab  Result Value Ref Range Status   SARS Coronavirus 2 NEGATIVE NEGATIVE Final    Comment: (NOTE) SARS-CoV-2 target nucleic acids are NOT DETECTED.  The SARS-CoV-2 RNA is generally detectable in upper and lower respiratory specimens during the acute phase of infection. The lowest concentration of SARS-CoV-2 viral copies this assay can detect is 250 copies / mL. A negative result does not preclude SARS-CoV-2 infection and should not be used as the sole basis for treatment or other patient management decisions.  A negative result may occur with improper specimen collection / handling, submission of specimen other than nasopharyngeal swab, presence of viral mutation(s) within the areas targeted by this assay, and inadequate number of  viral copies (<250 copies / mL). A negative result must be combined with clinical observations, patient history, and epidemiological information.  Fact Sheet for Patients:   StrictlyIdeas.no  Fact Sheet for Healthcare Providers: BankingDealers.co.za  This test is not yet approved or  cleared by the [place] FDA and has been authorized for detection and/or diagnosis of SARS-CoV-2 by FDA under an Emergency Use Authorization (EUA).  This EUA will remain in effect (meaning this test can be used) for the duration of the COVID-19 declaration under Section 564(b)(1) of the Act, 21 U.S.C. section 360bbb-3(b)(1), unless the authorization is terminated or revoked sooner.  Performed at [facility], [address], [place],  [address]     Coagulation Studies: No results for  input(s): LABPROT, INR in the last 72 hours.  Urinalysis:  Recent Labs  Lab 03/27/20 1159  COLORURINE STRAW*  LABSPEC 1.002*  PHURINE 7.0  GLUCOSEU NEGATIVE  HGBUR SMALL*  BILIRUBINUR NEGATIVE  KETONESUR NEGATIVE  PROTEINUR NEGATIVE  NITRITE NEGATIVE  LEUKOCYTESUR NEGATIVE    Lipid Panel:     Component Value Date/Time   CHOL 158 04/25/2019 0859   CHOL 156 04/11/2018 0900   TRIG 112.0 04/25/2019 0859   HDL 43.70 04/25/2019 0859   HDL 58 04/11/2018 0900   CHOLHDL 4 04/25/2019 0859   VLDL 22.4 04/25/2019 0859   LDLCALC 92 04/25/2019 0859   LDLCALC 84 04/11/2018 0900    HgbA1C:  Lab Results  Component Value Date   HGBA1C 6.1 03/24/2020    Urine Drug Screen:  No results found for: LABOPIA, COCAINSCRNUR, LABBENZ, AMPHETMU, THCU, LABBARB  Alcohol Level: No results for input(s): ETH in the last 168 hours.  Other results: EKG: normal EKG, normal sinus rhythm, unchanged from previous tracings.  Imaging: CT ANGIO HEAD W OR WO CONTRAST  Result Date: 03/27/2020 CLINICAL DATA:  Weakness arms in legs.  Neck pain EXAM: CT ANGIOGRAPHY  HEAD AND NECK TECHNIQUE: Multidetector CT imaging of the head and neck was performed using the standard protocol during bolus administration of intravenous contrast. Multiplanar CT image reconstructions and MIPs were obtained to evaluate the vascular anatomy. Carotid stenosis measurements (when applicable) are obtained utilizing NASCET criteria, using the distal internal carotid diameter as the denominator. CONTRAST:  72mL OMNIPAQUE IOHEXOL 350 MG/ML SOLN COMPARISON:  None. FINDINGS: CT HEAD FINDINGS Brain: No evidence of acute infarction, hemorrhage, hydrocephalus, extra-axial collection or mass lesion/mass effect. Vascular: Negative for hyperdense vessel Skull: Negative Sinuses: Negative Orbits: Negative Review of the MIP images confirms the above findings CTA NECK FINDINGS Aortic arch: Standard branching. Imaged portion shows no evidence of aneurysm or dissection. No significant stenosis of the major arch vessel origins. Mild atherosclerotic disease in the aortic arch and proximal great vessels. Right carotid system: Right carotid artery widely patent without stenosis. Small calcification right carotid bifurcation. Left carotid system: Left carotid widely patent without stenosis. Mild atherosclerotic calcification left carotid bifurcation Vertebral arteries: Both vertebral arteries patent to the basilar without stenosis. Skeleton: Mild degenerative changes in the cervical spine without acute abnormality. Other neck: Negative for mass or adenopathy in neck Upper chest: Left-sided pacemaker. Left arm contrast injection. There is stenosis or occlusion of the left innominate vein with opacification of numerous collateral vessels in the neck and around the lower cervical spine. Review of the MIP images confirms the above findings CTA HEAD FINDINGS Anterior circulation: Mild atherosclerotic calcification in the cavernous carotid bilaterally without stenosis. Anterior and middle cerebral arteries widely patent  bilaterally without stenosis or branch occlusion. Posterior circulation: Both vertebral arteries patent to the basilar. PICA patent bilaterally. Basilar widely patent. Superior cerebellar and posterior cerebral arteries patent bilaterally. Fetal origin of the left posterior cerebral artery. Venous sinuses: Normal venous enhancement Anatomic variants: None Review of the MIP images confirms the above findings IMPRESSION: 1. No acute intracranial abnormality.  Negative CT head 2. No significant carotid or vertebral artery stenosis in the neck. No intracranial stenosis or large vessel occlusion 3. 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 Electronically Signed   By: Franchot Gallo M.D.   On: 03/27/2020 13:06   CT Angio Neck W and/or Wo Contrast  Result Date: 03/27/2020 CLINICAL DATA:  Weakness arms in legs.  Neck pain EXAM: CT  ANGIOGRAPHY HEAD AND NECK TECHNIQUE: Multidetector CT imaging of the head and neck was performed using the standard protocol during bolus administration of intravenous contrast. Multiplanar CT image reconstructions and MIPs were obtained to evaluate the vascular anatomy. Carotid stenosis measurements (when applicable) are obtained utilizing NASCET criteria, using the distal internal carotid diameter as the denominator. CONTRAST:  74mL OMNIPAQUE IOHEXOL 350 MG/ML SOLN COMPARISON:  None. FINDINGS: CT HEAD FINDINGS Brain: No evidence of acute infarction, hemorrhage, hydrocephalus, extra-axial collection or mass lesion/mass effect. Vascular: Negative for hyperdense vessel Skull: Negative Sinuses: Negative Orbits: Negative Review of the MIP images confirms the above findings CTA NECK FINDINGS Aortic arch: Standard branching. Imaged portion shows no evidence of aneurysm or dissection. No significant stenosis of the major arch vessel origins. Mild atherosclerotic disease in the aortic arch and proximal great vessels. Right carotid system: Right  carotid artery widely patent without stenosis. Small calcification right carotid bifurcation. Left carotid system: Left carotid widely patent without stenosis. Mild atherosclerotic calcification left carotid bifurcation Vertebral arteries: Both vertebral arteries patent to the basilar without stenosis. Skeleton: Mild degenerative changes in the cervical spine without acute abnormality. Other neck: Negative for mass or adenopathy in neck Upper chest: Left-sided pacemaker. Left arm contrast injection. There is stenosis or occlusion of the left innominate vein with opacification of numerous collateral vessels in the neck and around the lower cervical spine. Review of the MIP images confirms the above findings CTA HEAD FINDINGS Anterior circulation: Mild atherosclerotic calcification in the cavernous carotid bilaterally without stenosis. Anterior and middle cerebral arteries widely patent bilaterally without stenosis or branch occlusion. Posterior circulation: Both vertebral arteries patent to the basilar. PICA patent bilaterally. Basilar widely patent. Superior cerebellar and posterior cerebral arteries patent bilaterally. Fetal origin of the left posterior cerebral artery. Venous sinuses: Normal venous enhancement Anatomic variants: None Review of the MIP images confirms the above findings IMPRESSION: 1. No acute intracranial abnormality.  Negative CT head 2. No significant carotid or vertebral artery stenosis in the neck. No intracranial stenosis or large vessel occlusion 3. 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 Electronically Signed   By: Franchot Gallo M.D.   On: 03/27/2020 13:06     Assessment/Plan:  79 y.o. female with a past medical history of CAD, pacemaker, gastric reflux, hypertension, hyperlipidemia, psoriasis, arthritis, presents to the emergency department for worsening numbness and tingling.  According to the patient she got her Enbrel  injection approximately 1 week ago with total 2 doses first one being beginning of June.   She states since she has been experiencing tingling in her feet that has been progressing up her body that started last Wednesday.  Patient states initially she saw her doctor who diagnosed her with neuropathy however the numbness and tingling sensation continued to progress up her legs and into her upper extremities as well.  Today patient states she has been getting weaker in both upper and lower extremities along with paraesthesias ascending.  Pt was on Humara prior to Enbrel injections.    - Progressive numbness and weakness, diminishe reflexes in LE - LP with cell count and protein is only 33. Does not fit into the cytoalbuminilogical dissociation  - B12 level normal - normal CTH and CTA. - Agree she can't be discharged at this time.   - Unable to obtain MRI due to PPM - Suspect this could be medication induced.  If doesn't improve would consider IVIG   03/28/2020, 1:50 PM

## 2020-03-29 ENCOUNTER — Inpatient Hospital Stay: Payer: Medicare Other

## 2020-03-29 ENCOUNTER — Encounter
Admission: RE | Admit: 2020-03-29 | Discharge: 2020-03-29 | Disposition: A | Discharge status: Home / Self Care | Payer: Medicare Other | Source: Ambulatory Visit | Attending: Internal Medicine | Admitting: Internal Medicine

## 2020-03-29 DIAGNOSIS — G6289 Other specified polyneuropathies: Secondary | ICD-10-CM

## 2020-03-29 LAB — BASIC METABOLIC PANEL
Anion gap: 6 (ref 5–15)
BUN: 12 mg/dL (ref 8–23)
CO2: 26 mmol/L (ref 22–32)
Calcium: 8.7 mg/dL — ABNORMAL LOW (ref 8.9–10.3)
Chloride: 101 mmol/L (ref 98–111)
Creatinine, Ser: 0.57 mg/dL (ref 0.44–1.00)
GFR calc Af Amer: >60 mL/min (ref >60)
GFR calc non Af Amer: >60 mL/min (ref >60)
Glucose, Bld: 105 mg/dL — ABNORMAL HIGH (ref 70–99)
Potassium: 3.9 mmol/L (ref 3.5–5.1)
Sodium: 133 mmol/L — ABNORMAL LOW (ref 135–145)

## 2020-03-29 MED ORDER — IMMUNE GLOBULIN (HUMAN) 10 GM/100ML IV SOLN
400.0000 mg/kg | INTRAVENOUS | Status: DC
  Administered 2020-03-29: 30 g via INTRAVENOUS
  Filled 2020-03-29 (×3): qty 300

## 2020-03-29 MED ORDER — MAGNESIUM CITRATE PO SOLN
1.0000 | Freq: Once | ORAL | Status: AC
  Administered 2020-03-29: 1 via ORAL
  Filled 2020-03-29: qty 296

## 2020-03-29 MED ORDER — BISACODYL 10 MG RE SUPP: 10.0000 mg | Freq: Every day | RECTAL | Status: DC | PRN

## 2020-03-29 MED ORDER — CYANOCOBALAMIN 1000 MCG/ML IJ SOLN
1000.0000 ug | Freq: Once | INTRAMUSCULAR | Status: AC
  Administered 2020-03-29: 1000 ug via INTRAMUSCULAR
  Filled 2020-03-29: qty 1

## 2020-03-29 NOTE — Progress Notes (Addendum)
PROGRESS NOTE    Alison Huffman  IRJ:[id number] DOB: 05-01-1941 DOA: 03/27/2020 PCP: Leone Haven, MD   Brief Narrative:  79 year old female with past medical history for coronary artery disease, GERD, second-degree heart block s/p pacemaker hypertension, psoriasis, and arthritis.  She presents with  progressive numbness and weakness.  She reported progressive ascending weakness, initially lower extremities then involving the upper extremities. Her symptoms appear 2 days after her second dose of Enberl (03/15/20).  As an outpatient she was diagnosed with neuropathy.  She presented to the ED due to persistent and worsening symptoms. Neurology was consulted, LP with protein of only 33, no cytoalbumin neurological dissociation and she was started on IVIG due to persistent sensory neuropathy most likely secondary to Enberl.  Subjective: Patient continued to experience worsening tingling and numbness in her upper extremities.  Persistent numbness in her lower extremities including gluteal area.  She was also complaining of numbness in a band shaped manner around lower rib cage.  She was also complaining of mild shortness of breath.  She was also complaining of constipation and abdominal distention, last BM was on Tuesday.  She had a very small BM after getting enema yesterday.  Assessment & Plan:   Principal Problem:   Acute motor and sensory axonal neuropathy Active Problems:   Hypertension   OSA (obstructive sleep apnea)   GERD (gastroesophageal reflux disease)   Numbness and tingling of lower extremity   Numbness and tingling of both feet   Ambulatory dysfunction  Acute motor and sensory neuropathy, in the setting of Enblrel use. Lumbar puncture with 33 protein. Patient continue to have significant ambulatory dysfunction, this am was not able to stand by herself and required significant assistance. B12 323.  Repeat CT neck without any acute abnormalities. Chest x-ray with bilateral  basal atelectasis.  Patient is saturating well on room air. Due to worsening tingling and numbness neurology decided to place her on IVIG X 5 days. -PT/OT evaluation are recommending SNF placement. -Continue to monitor respiratory status.  Constipation/abdominal distention.  KUB with right colonic stool burden. -We will try magnesium citrate and Dulcolax suppositories together. -Regular bowel regimen.  Hypertension. -Blood pressure mildly elevated. -Continue home valsartan.  Psoriasis.  Patient was treated with home manner for 2 years with no response and recently started on Enbrel, most likely the cause of her current symptoms. -You should not get that medication again. -Follow-up with rheumatology as an outpatient.  GERD. -Continue home antacid.  Hyponatremia.  Most likely secondary to poor p.o. intake. Improving with gentle hydration. -Continue to monitor.  Objective: Vitals:   03/29/20 0413 03/29/20 0727 03/29/20 1155 03/29/20 1610  BP: (!) 154/78 (!) 150/68 (!) 144/77 (!) 141/73  Pulse: 79 72 80 88  Resp: 16 18 17 18   Temp: 98 F (36.7 C) 98.6 F (37 C) 98.9 F (37.2 C) 98 F (36.7 C)  TempSrc: Oral   Oral  SpO2: 94% 95% 93% 95%  Weight:      Height:        Intake/Output Summary (Last 24 hours) at 03/29/2020 1618 Last data filed at 03/29/2020 1352 Gross per 24 hour  Intake 900 ml  Output --  Net 900 ml   Filed Weights   03/27/20 1024  Weight: 76.2 kg    Examination:  General exam: Appears calm and comfortable  Respiratory system: Few basal crackles. Respiratory effort normal. Cardiovascular system: S1 & S2 heard, RRR. No JVD, murmurs, rubs, Gastrointestinal system: Soft, nontender, mildly distended, bowel  sounds positive. Central nervous system: Alert and oriented.  4/5 strength in upper extremities, 5/5 in lower extremities with decreased sensation in upper and lower extremities. Extremities: No edema, no cyanosis, pulses intact and symmetrical. Skin:  No rashes, lesions or ulcers Psychiatry: Judgement and insight appear normal.   DVT prophylaxis: Lovenox Code Status: Full Family Communication: Tried calling niece with no response. Disposition Plan:  Status is: Inpatient  Remains inpatient appropriate because:Inpatient level of care appropriate due to severity of illness   Dispo: The patient is from: Home              Anticipated d/c is to: SNF              Anticipated d/c date is: > 3 days              Patient currently is not medically stable to d/c.  Patient was started on IVIG for 5 days today.  Consultants:   Neurology  Procedures:  LP  Antimicrobials:   Data Reviewed: I have personally reviewed following labs and imaging studies  CBC: Recent Labs  Lab 03/25/20 0950 03/27/20 1029 03/28/20 0548  WBC 5.4 5.7 5.5  NEUTROABS 3.8  --   --   HGB 13.5 12.8 13.7  HCT 40.8 38.3 39.8  MCV 92.1 90.1 88.2  PLT 228 226 347   Basic Metabolic Panel: Recent Labs  Lab 03/24/20 1438 03/25/20 1023 03/27/20 1029 03/28/20 0548 03/29/20 0527  NA 139 141 130* 131* 133*  K 4.5 4.2 4.0 3.9 3.9  CL 103 106 99 98 101  CO2 28 28 22 24 26   GLUCOSE 78 99 102* 97 105*  BUN 11 10 9 10 12   CREATININE 0.59 0.61 0.47 0.50 0.57  CALCIUM 9.7 9.2 8.9 9.0 8.7*   GFR: Estimated Creatinine Clearance: 58.5 mL/min (by C-G formula based on SCr of 0.57 mg/dL). Liver Function Tests: Recent Labs  Lab 03/24/20 1438 03/25/20 1023  AST 15 16  ALT 13 15  ALKPHOS 87 78  BILITOT 0.6 1.0  PROT 7.0 7.2  ALBUMIN 4.1 3.7   Recent Labs  Lab 03/25/20 1023  LIPASE 26   No results for input(s): AMMONIA in the last 168 hours. Coagulation Profile: No results for input(s): INR, PROTIME in the last 168 hours. Cardiac Enzymes: No results for input(s): CKTOTAL, CKMB, CKMBINDEX, TROPONINI in the last 168 hours. BNP (last 3 results) No results for input(s): PROBNP in the last 8760 hours. HbA1C: No results for input(s): HGBA1C in the last 72  hours. CBG: No results for input(s): GLUCAP in the last 168 hours. Lipid Profile: No results for input(s): CHOL, HDL, LDLCALC, TRIG, CHOLHDL, LDLDIRECT in the last 72 hours. Thyroid Function Tests: Recent Labs    03/27/20 1247  TSH 1.312   Anemia Panel: Recent Labs    03/27/20 1247  VITAMINB12 323   Sepsis Labs: No results for input(s): PROCALCITON, LATICACIDVEN in the last 168 hours.  Recent Results (from the past 240 hour(s))  SARS Coronavirus 2 by RT PCR (hospital order, performed in [facility] hospital lab) Nasopharyngeal Nasopharyngeal Swab     Status: None   Collection Time: 03/27/20  1:55 PM   Specimen: Nasopharyngeal Swab  Result Value Ref Range Status   SARS Coronavirus 2 NEGATIVE NEGATIVE Final    Comment: (NOTE) SARS-CoV-2 target nucleic acids are NOT DETECTED.  The SARS-CoV-2 RNA is generally detectable in upper and lower respiratory specimens during the acute phase of infection. The lowest concentration of SARS-CoV-2  viral copies this assay can detect is 250 copies / mL. A negative result does not preclude SARS-CoV-2 infection and should not be used as the sole basis for treatment or other patient management decisions.  A negative result may occur with improper specimen collection / handling, submission of specimen other than nasopharyngeal swab, presence of viral mutation(s) within the areas targeted by this assay, and inadequate number of viral copies (<250 copies / mL). A negative result must be combined with clinical observations, patient history, and epidemiological information.  Fact Sheet for Patients:   StrictlyIdeas.no  Fact Sheet for Healthcare Providers: BankingDealers.co.za  This test is not yet approved or  cleared by the [place] FDA and has been authorized for detection and/or diagnosis of SARS-CoV-2 by FDA under an Emergency Use Authorization (EUA).  This EUA will remain in effect  (meaning this test can be used) for the duration of the COVID-19 declaration under Section 564(b)(1) of the Act, 21 U.S.C. section 360bbb-3(b)(1), unless the authorization is terminated or revoked sooner.  Performed at [facility], [address] [place], [place] [address]      Radiology Studies: DG Chest 2 View  Result Date: 03/29/2020 CLINICAL DATA:  Shortness of breath, abdominal distension, no bowel movement for 5-6 days, history coronary artery disease, diastolic dysfunction, hypertension EXAM: CHEST - 2 VIEW COMPARISON:  07/26/2018 FINDINGS: LEFT subclavian sequential transvenous pacemaker leads project at RIGHT atrium and RIGHT ventricle. Normal heart size, mediastinal contours, and pulmonary vascularity. Atherosclerotic calcification aorta. Minimal bibasilar atelectasis. Otherwise clear. No infiltrate, pleural effusion or pneumothorax. Bones demineralized. IMPRESSION: Minimal bibasilar atelectasis. Aortic Atherosclerosis (ICD10-I70.0). Electronically Signed   By: Lavonia Dana M.D.   On: 03/29/2020 11:39   DG Abd 1 View  Result Date: 03/29/2020 CLINICAL DATA:  Shortness of breath, abdominal distension, no bowel movement for 5-6 days EXAM: ABDOMEN - 1 VIEW COMPARISON:  None FINDINGS: Increased stool in RIGHT colon. Remaining bowel gas pattern normal. Scattered air-filled loops of nondistended large and small bowel throughout remainder of abdomen. No bowel wall thickening or obstruction. Bones demineralized. IMPRESSION: Increased stool RIGHT colon. Electronically Signed   By: Lavonia Dana M.D.   On: 03/29/2020 11:40   CT C-SPINE NO CHARGE  Result Date: 03/29/2020 CLINICAL DATA:  Worsening numbness and tingling.  Neck pain. EXAM: CT CERVICAL SPINE WITHOUT CONTRAST TECHNIQUE: Multidetector CT imaging of the cervical spine was performed without intravenous contrast. Multiplanar CT image reconstructions were also generated. COMPARISON:  CT angio head and neck 03/27/2020 FINDINGS:  Request was made for cervical spine reconstruction from the CT angio head and neck data performed on 03/27/2020. Alignment: Normal.  Mild scoliosis. Skull base and vertebrae: Negative for fracture. No focal skeletal lesion identified. Soft tissues and spinal canal: Extensive venous reflux into the paravertebral veins extending into the vertebral bodies. 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 Left arm contrast injection. No soft tissue mass or edema.  Vascular findings per CTA report Disc levels:  C2-3: Negative C3-4: Mild disc degeneration and spurring without significant spinal or foraminal stenosis. C4-5: Mild disc degeneration and uncinate spurring bilaterally without significant stenosis. C5-6: Negative C6-7: Mild disc degeneration and mild spurring. No significant stenosis C7-T1: Negative Upper chest: Mild ground-glass density in the right upper lobe could represent acute or chronic airspace disease. No effusion. Other: None IMPRESSION: No acute abnormality in the cervical spine.  No fracture or mass Mild degenerative changes cervical spine without spinal or foraminal encroachment. 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 Electronically Signed   By: Franchot Gallo M.D.   On: 03/29/2020 12:52    Scheduled Meds: . cholecalciferol  2,000 Units Oral Daily  . clobetasol cream  1 application Topical Q Sun  . enoxaparin (LOVENOX) injection  40 mg Subcutaneous Q24H  . famotidine  20 mg Oral BID  . irbesartan  75 mg Oral Daily  . psyllium  1 packet Oral Daily  . sodium chloride flush  3 mL Intravenous Q12H   Continuous Infusions: . sodium chloride    . sodium chloride 75 mL/hr at 03/29/20 0349  . Immune Globulin 10%       LOS: 1 day   Time spent: 45 minutes.  Lorella Nimrod, MD [facility]  If 7PM-7AM, please contact night-coverage [email or web address]  03/29/2020, 4:18 PM   This record has been created  using Systems analyst. Errors have been sought and corrected,but may not always be located. Such creation errors do not reflect on the standard of care.

## 2020-03-29 NOTE — Progress Notes (Signed)
Subjective: Patient reports no worsening of symptoms but appears to be SOB with conversation today.  Remains weak.    Objective: Current vital signs: BP (!) 150/68 (BP Location: Right Arm)   Pulse 72   Temp 98.6 F (37 C)   Resp 18   Ht 5\' 6"  (1.676 m)   Wt 76.2 kg   SpO2 95%   BMI 27.12 kg/m  Vital signs in last 24 hours: Temp:  [97.3 F (36.3 C)-98.6 F (37 C)] 98.6 F (37 C) (06/28 0727) Pulse Rate:  [50-92] 72 (06/28 0727) Resp:  [16-18] 18 (06/28 0727) BP: (129-160)/(61-78) 150/68 (06/28 0727) SpO2:  [93 %-97 %] 95 % (06/28 0727)  Intake/Output from previous day: 06/27 0701 - 06/28 0700 In: 898 [P.O.:840; I.V.:58] Out: -  Intake/Output this shift: Total I/O In: 480 [P.O.:480] Out: -  Nutritional status:  Diet Order            Diet 2 gram sodium Room service appropriate? Yes; Fluid consistency: Thin  Diet effective now                 Neurologic Exam: Mental Status: Alert, oriented, thought content appropriate.  Speech fluent without evidence of aphasia.  Able to follow 3 step commands without difficulty. Cranial Nerves: II: Visual fields grossly normal, pupils equal, round, reactive to light and accommodation III,IV, VI: ptosis not present, extra-ocular motions intact bilaterally V,VII: smile symmetric but opens mouth very little for conversation, facial light touch sensation normal bilaterally VIII: hearing normal bilaterally IX,X: gag reflex present XI: bilateral shoulder shrug XII: midline tongue extension Motor: Unable to lift arms or maintain against gravity at the shoulders.  Elbow flexion and extension as well as hand grip at 4/5.  Able to lift both lower extremity 2-3 inches off the bed.  5/5 ankle flexion and extension noted.   Sensory: Pinprick and light touch decreased in the lower extremities to include the abdomen and buttocks.  Also decreased in the hands bilaterally Deep Tendon Reflexes: 1+ in the upper extremities and absent in the lower  extremities.   Plantars: Right: mute   Left: mute   Lab Results: Basic Metabolic Panel: Recent Labs  Lab 03/24/20 1438 03/24/20 1438 03/25/20 1023 03/25/20 1023 03/27/20 1029 03/28/20 0548 03/29/20 0527  NA 139  --  141  --  130* 131* 133*  K 4.5  --  4.2  --  4.0 3.9 3.9  CL 103  --  106  --  99 98 101  CO2 28  --  28  --  22 24 26   GLUCOSE 78  --  99  --  102* 97 105*  BUN 11  --  10  --  9 10 12   CREATININE 0.59  --  0.61  --  0.47 0.50 0.57  CALCIUM 9.7   < > 9.2   < > 8.9 9.0 8.7*   < > = values in this interval not displayed.    Liver Function Tests: Recent Labs  Lab 03/24/20 1438 03/25/20 1023  AST 15 16  ALT 13 15  ALKPHOS 87 78  BILITOT 0.6 1.0  PROT 7.0 7.2  ALBUMIN 4.1 3.7   Recent Labs  Lab 03/25/20 1023  LIPASE 26   No results for input(s): AMMONIA in the last 168 hours.  CBC: Recent Labs  Lab 03/25/20 0950 03/27/20 1029 03/28/20 0548  WBC 5.4 5.7 5.5  NEUTROABS 3.8  --   --   HGB 13.5 12.8 13.7  HCT 40.8 38.3 39.8  MCV 92.1 90.1 88.2  PLT 228 226 223    Cardiac Enzymes: No results for input(s): CKTOTAL, CKMB, CKMBINDEX, TROPONINI in the last 168 hours.  Lipid Panel: No results for input(s): CHOL, TRIG, HDL, CHOLHDL, VLDL, LDLCALC in the last 168 hours.  CBG: No results for input(s): GLUCAP in the last 168 hours.  Microbiology: Results for orders placed or performed during the hospital encounter of 03/27/20  SARS Coronavirus 2 by RT PCR (hospital order, performed in [facility] hospital lab) Nasopharyngeal Nasopharyngeal Swab     Status: None   Collection Time: 03/27/20  1:55 PM   Specimen: Nasopharyngeal Swab  Result Value Ref Range Status   SARS Coronavirus 2 NEGATIVE NEGATIVE Final    Comment: (NOTE) SARS-CoV-2 target nucleic acids are NOT DETECTED.  The SARS-CoV-2 RNA is generally detectable in upper and lower respiratory specimens during the acute phase of infection. The lowest concentration of SARS-CoV-2 viral copies  this assay can detect is 250 copies / mL. A negative result does not preclude SARS-CoV-2 infection and should not be used as the sole basis for treatment or other patient management decisions.  A negative result may occur with improper specimen collection / handling, submission of specimen other than nasopharyngeal swab, presence of viral mutation(s) within the areas targeted by this assay, and inadequate number of viral copies (<250 copies / mL). A negative result must be combined with clinical observations, patient history, and epidemiological information.  Fact Sheet for Patients:   StrictlyIdeas.no  Fact Sheet for Healthcare Providers: BankingDealers.co.za  This test is not yet approved or  cleared by the [place] FDA and has been authorized for detection and/or diagnosis of SARS-CoV-2 by FDA under an Emergency Use Authorization (EUA).  This EUA will remain in effect (meaning this test can be used) for the duration of the COVID-19 declaration under Section 564(b)(1) of the Act, 21 U.S.C. section 360bbb-3(b)(1), unless the authorization is terminated or revoked sooner.  Performed at [facility], [address] [place], [place] [address]     Coagulation Studies: No results for input(s): LABPROT, INR in the last 72 hours.  Imaging: CT ANGIO HEAD W OR WO CONTRAST  Result Date: 03/27/2020 CLINICAL DATA:  Weakness arms in legs.  Neck pain EXAM: CT ANGIOGRAPHY HEAD AND NECK TECHNIQUE: Multidetector CT imaging of the head and neck was performed using the standard protocol during bolus administration of intravenous contrast. Multiplanar CT image reconstructions and MIPs were obtained to evaluate the vascular anatomy. Carotid stenosis measurements (when applicable) are obtained utilizing NASCET criteria, using the distal internal carotid diameter as the denominator. CONTRAST:  40mL OMNIPAQUE IOHEXOL 350 MG/ML SOLN COMPARISON:   None. FINDINGS: CT HEAD FINDINGS Brain: No evidence of acute infarction, hemorrhage, hydrocephalus, extra-axial collection or mass lesion/mass effect. Vascular: Negative for hyperdense vessel Skull: Negative Sinuses: Negative Orbits: Negative Review of the MIP images confirms the above findings CTA NECK FINDINGS Aortic arch: Standard branching. Imaged portion shows no evidence of aneurysm or dissection. No significant stenosis of the major arch vessel origins. Mild atherosclerotic disease in the aortic arch and proximal great vessels. Right carotid system: Right carotid artery widely patent without stenosis. Small calcification right carotid bifurcation. Left carotid system: Left carotid widely patent without stenosis. Mild atherosclerotic calcification left carotid bifurcation Vertebral arteries: Both vertebral arteries patent to the basilar without stenosis. Skeleton: Mild degenerative changes in the cervical spine without acute abnormality. Other neck: Negative for mass or adenopathy in neck Upper chest:  Left-sided pacemaker. Left arm contrast injection. There is stenosis or occlusion of the left innominate vein with opacification of numerous collateral vessels in the neck and around the lower cervical spine. Review of the MIP images confirms the above findings CTA HEAD FINDINGS Anterior circulation: Mild atherosclerotic calcification in the cavernous carotid bilaterally without stenosis. Anterior and middle cerebral arteries widely patent bilaterally without stenosis or branch occlusion. Posterior circulation: Both vertebral arteries patent to the basilar. PICA patent bilaterally. Basilar widely patent. Superior cerebellar and posterior cerebral arteries patent bilaterally. Fetal origin of the left posterior cerebral artery. Venous sinuses: Normal venous enhancement Anatomic variants: None Review of the MIP images confirms the above findings IMPRESSION: 1. No acute intracranial abnormality.  Negative CT head 2.  No significant carotid or vertebral artery stenosis in the neck. No intracranial stenosis or large vessel occlusion 3. 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 Electronically Signed   By: Franchot Gallo M.D.   On: 03/27/2020 13:06   CT Angio Neck W and/or Wo Contrast  Result Date: 03/27/2020 CLINICAL DATA:  Weakness arms in legs.  Neck pain EXAM: CT ANGIOGRAPHY HEAD AND NECK TECHNIQUE: Multidetector CT imaging of the head and neck was performed using the standard protocol during bolus administration of intravenous contrast. Multiplanar CT image reconstructions and MIPs were obtained to evaluate the vascular anatomy. Carotid stenosis measurements (when applicable) are obtained utilizing NASCET criteria, using the distal internal carotid diameter as the denominator. CONTRAST:  35mL OMNIPAQUE IOHEXOL 350 MG/ML SOLN COMPARISON:  None. FINDINGS: CT HEAD FINDINGS Brain: No evidence of acute infarction, hemorrhage, hydrocephalus, extra-axial collection or mass lesion/mass effect. Vascular: Negative for hyperdense vessel Skull: Negative Sinuses: Negative Orbits: Negative Review of the MIP images confirms the above findings CTA NECK FINDINGS Aortic arch: Standard branching. Imaged portion shows no evidence of aneurysm or dissection. No significant stenosis of the major arch vessel origins. Mild atherosclerotic disease in the aortic arch and proximal great vessels. Right carotid system: Right carotid artery widely patent without stenosis. Small calcification right carotid bifurcation. Left carotid system: Left carotid widely patent without stenosis. Mild atherosclerotic calcification left carotid bifurcation Vertebral arteries: Both vertebral arteries patent to the basilar without stenosis. Skeleton: Mild degenerative changes in the cervical spine without acute abnormality. Other neck: Negative for mass or adenopathy in neck Upper chest: Left-sided pacemaker. Left  arm contrast injection. There is stenosis or occlusion of the left innominate vein with opacification of numerous collateral vessels in the neck and around the lower cervical spine. Review of the MIP images confirms the above findings CTA HEAD FINDINGS Anterior circulation: Mild atherosclerotic calcification in the cavernous carotid bilaterally without stenosis. Anterior and middle cerebral arteries widely patent bilaterally without stenosis or branch occlusion. Posterior circulation: Both vertebral arteries patent to the basilar. PICA patent bilaterally. Basilar widely patent. Superior cerebellar and posterior cerebral arteries patent bilaterally. Fetal origin of the left posterior cerebral artery. Venous sinuses: Normal venous enhancement Anatomic variants: None Review of the MIP images confirms the above findings IMPRESSION: 1. No acute intracranial abnormality.  Negative CT head 2. No significant carotid or vertebral artery stenosis in the neck. No intracranial stenosis or large vessel occlusion 3. 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 Electronically Signed   By: Franchot Gallo M.D.   On: 03/27/2020 13:06    Medications:  I have reviewed the patient's current medications. Scheduled: . cholecalciferol  2,000 Units Oral Daily  . clobetasol cream  1  application Topical Q Sun  . enoxaparin (LOVENOX) injection  40 mg Subcutaneous Q24H  . famotidine  20 mg Oral BID  . irbesartan  75 mg Oral Daily  . magnesium citrate  1 Bottle Oral Once  . psyllium  1 packet Oral Daily  . sodium chloride flush  3 mL Intravenous Q12H    Assessment/Plan: 79 y.o. female with a past medical history of CAD, pacemaker, gastric reflux, hypertension, hyperlipidemia, psoriasis, arthritis, presents to the emergency department for worsening numbness and tingling. According to the patient she got her Enbrel injection approximately 1 week ago with total 2 doses first one  being beginning of June.  She states since she has been experiencing tingling in her feet that has been progressing up her body that started last Wednesday. Patient states initially she saw her doctor who diagnosed her with neuropathy however the numbness and tingling sensation continued to progress up her legs and into her upper extremities as well. Weakness has progressed.  Reports she is stable today.  Concerned about SOB with conversation.  Suspect ascending neuropathy as a side effect of Enbrel.  LP was unremarkable.    Recommendations: 1. CT of the cervical spine today 2. NIF and VC q 12  hours 3. If CT of the cervical spine provides no etiology for weakness patient to start IVIg at 400mg /kg/day for 5 days.   4. Telemetry   LOS: 1 day   Alison Goodell, MD Neurology [phone] 03/29/2020  11:37 AM

## 2020-03-29 NOTE — Progress Notes (Signed)
NIF -20

## 2020-03-29 NOTE — Evaluation (Signed)
Occupational Therapy Evaluation Patient Details Name: Alison Huffman MRN: [id number] DOB: 12-06-1940 Today's Date: 03/29/2020    History of Present Illness Alison Huffman is an 79 y/o F with PMH: CAD, PPM, gastric reflux, hypertension, hyperlipidemia, psoriasis, arthritis, who presented to the [facility] ED on 6/24 d/t worsening numbness and tingling.  According to the patient she got her Enbrel injection approximately 1 week ago with total 2 doses first one being beginning of June (reports she was previously on Humira). She states since she has been experiencing tingling in her feet that has been progressing up her body that started last Wednesday. Her PCP initially dx with neuropathy, however the numbness and tingling continued to progress up her LEs and into her UEs as well. 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 LP was unremarkable.   Clinical Impression   Pt was seen for OT evaluation this date. Prior to hospital admission, pt was Indep with self care ADLs, completed HH IADLs, and was walking w/o AD, sometimes up to 59mi/day for exercise. Pt lives in [place] and [place]. Currently pt demonstrates impairments as described below (See OT problem list) which functionally limit her ability to perform ADL/self-care tasks. Pt currently requires MIN/MOD A for sit to stand with arm in arm from elevated surface, requires MOD A for LB ADLs in sitting and setup to MIN A for UB ADLs (intermittent MIN A for unsupported sitting to sustain balance while completing a task as well).  Pt would benefit from skilled OT to address noted impairments and functional limitations (see below for any additional details) in order to maximize safety and independence while minimizing falls risk and caregiver burden. Upon hospital discharge, recommend STR to maximize pt safety and return to PLOF. Of note: pt does appear to have somewhat increased WOB on occasion during conversation/while  talking and during light activity such as sup to sit transition.    Follow Up Recommendations  SNF    Equipment Recommendations  Other (comment) (defer to next level of care)    Recommendations for Other Services       Precautions / Restrictions Precautions Precautions: Fall Restrictions Weight Bearing Restrictions: No      Mobility Bed Mobility Overal bed mobility: Needs Assistance Bed Mobility: Supine to Sit;Sit to Supine     Supine to sit: Min assist;HOB elevated Sit to supine: Mod assist      Transfers Overall transfer level: Needs assistance Equipment used: None Transfers: Sit to/from Stand Sit to Stand: Min assist;Mod assist;From elevated surface         General transfer comment: w/ L knee blocking to prevent buckle.    Balance Overall balance assessment: Needs assistance   Sitting balance-Leahy Scale: Good Sitting balance - Comments: G static sitting balance. F dynamic; Pt demos some posterior lean and ultimate LOB after sitting several minutes, when trying to turn and reach across towards her L to answer telephone.   Standing balance support: Bilateral upper extremity supported;During functional activity Standing balance-Leahy Scale: Poor Standing balance comment: Requires UE support and MAX A to sustain static stand. Demos knee buckling and poor control for stand to sit.                           ADL either performed or assessed with clinical judgement   ADL  General ADL Comments: pt requires setup and intermittent MIN A to sustain static sitting balance for UB ADLs while in EOB sitting. Requires MOD A for LB ADLs in sitting. MIN/MOD A with knee blocking and posteriorly bracing on bed for ADL transfers from elevated surface w/ arm in arm tehcnique.     Vision Patient Visual Report: No change from baseline       Perception     Praxis      Pertinent Vitals/Pain Pain  Assessment: No/denies pain     Hand Dominance     Extremity/Trunk Assessment Upper Extremity Assessment Upper Extremity Assessment: Generalized weakness;RUE deficits/detail;LUE deficits/detail (pt weaker proximally: b/l shld flexion 1/5 MMT (unable against gravity), elbow grossly 4/5, grip 4-/5.) RUE Sensation: decreased light touch;decreased proprioception RUE Coordination: decreased fine motor;decreased gross motor LUE Sensation: decreased light touch;decreased proprioception LUE Coordination: decreased fine motor;decreased gross motor   Lower Extremity Assessment Lower Extremity Assessment: Defer to PT evaluation;Generalized weakness       Communication Communication Communication: No difficulties (did c/o feeling like her mouth was getting tired, some limited innunciation)   Cognition Arousal/Alertness: Awake/alert Behavior During Therapy: WFL for tasks assessed/performed Overall Cognitive Status: Within Functional Limits for tasks assessed                                     General Comments       Exercises Other Exercises Other Exercises: OT facilitates education re: role of OT in acute setting. Pt states that she is somewhat familiar. In addition, OT educates re: B UE ROM/AAROM exercises that pt can complete outside of therapy time to encourage carryover/maintenance of joint elasticity. Pt with good reception.   Shoulder Instructions      Home Living Family/patient expects to be discharged to:: Other (Comment) (Indep Living) Living Arrangements: Alone                 Bathroom Shower/Tub: Walk-in shower         Home Equipment: None          Prior Functioning/Environment Level of Independence: Independent        Comments: Still independent with IADL; no prior falls history; walked ~1 miile for activity with no AD        OT Problem List: Decreased strength;Decreased range of motion;Decreased activity tolerance;Impaired balance  (sitting and/or standing);Decreased coordination;Decreased knowledge of use of DME or AE;Impaired sensation;Impaired tone;Impaired UE functional use      OT Treatment/Interventions: Self-care/ADL training;Therapeutic exercise;Neuromuscular education;Energy conservation;DME and/or AE instruction;Therapeutic activities;Patient/family education;Balance training    OT Goals(Current goals can be found in the care plan section) Acute Rehab OT Goals Patient Stated Goal: regain independent mobility OT Goal Formulation: With patient Time For Goal Achievement: 04/12/20 Potential to Achieve Goals: Good  OT Frequency: Min 2X/week   Barriers to D/C:            Co-evaluation              AM-PAC OT "6 Clicks" Daily Activity     Outcome Measure Help from another person eating meals?: A Little Help from another person taking care of personal grooming?: A Little Help from another person toileting, which includes using toliet, bedpan, or urinal?: A Lot Help from another person bathing (including washing, rinsing, drying)?: A Lot Help from another person to put on and taking off regular upper body clothing?: A Little Help from another person to put  on and taking off regular lower body clothing?: A Lot 6 Click Score: 15   End of Session Equipment Utilized During Treatment: Gait belt  Activity Tolerance: Patient tolerated treatment well Patient left: in bed;with call bell/phone within reach;with bed alarm set  OT Visit Diagnosis: Unsteadiness on feet (R26.81);Other abnormalities of gait and mobility (R26.89);Muscle weakness (generalized) (M62.81);Other symptoms and signs involving the nervous system (R29.898)                Time: 9563-8756 OT Time Calculation (min): 38 min Charges:  OT General Charges $OT Visit: 1 Visit OT Evaluation $OT Eval Moderate Complexity: 1 Mod OT Treatments $Self Care/Home Management : 8-22 mins $Therapeutic Activity: 8-22 mins  Gerrianne Scale, MS, OTR/L ascom  [phone] 03/29/20, 4:34 PM

## 2020-03-29 NOTE — Progress Notes (Signed)
FVC 1.52 L

## 2020-03-29 NOTE — Evaluation (Signed)
Physical Therapy Evaluation Patient Details Name: Alison Huffman MRN: [id number] DOB: [birth year] Today's Date: 03/29/2020   History of Present Illness  Alison Huffman is an 45yoF who [facility] on 6/24 c progressive insidious onset paresthesias in hands and feet, eventually with weakness, now progressed to total body including trunk and head. Neurology reports presentation is not congruent with GBS, but suspect may be related to recent new immunomodulating medication. PMH: psoriatic arthristis, CAD, PPM, HTN.  Clinical Impression  Pt admitted with above diagnosis. Pt currently with functional limitations due to the deficits listed below (see "PT Problem List"). Upon entry, pt in bed, awake and agreeable to participate. The pt is alert and oriented x4, pleasant, conversational, and generally a good historian. Pt requires modA for bed mobility, mod-maxA for STS and SPT bed to/from recliner. Pt has obvious loss of control of BLE in stance with frequent medial collapse bilat as well as buckling of knees (Lt > Rt). Functional mobility assessment demonstrates increased effort/time requirements, poor tolerance, and need for physical assistance, whereas the patient performed these at a higher level of independence PTA. Pt will benefit from skilled PT intervention to increase independence and safety with basic mobility in preparation for discharge to the venue listed below.       Follow Up Recommendations SNF;Supervision for mobility/OOB    Equipment Recommendations  None recommended by PT    Recommendations for Other Services       Precautions / Restrictions Precautions Precautions: Fall Restrictions Weight Bearing Restrictions: No      Mobility  Bed Mobility Overal bed mobility: Needs Assistance Bed Mobility: Supine to Sit;Sit to Supine     Supine to sit: Mod assist Sit to supine: Max assist      Transfers Overall transfer level: Needs assistance Equipment used: None Transfers:  Sit to/from [facility] Sit to Stand: Mod assist Stand pivot transfers: Mod assist       General transfer comment: knee block due to Left buckling.  Ambulation/Gait Ambulation/Gait assistance:  (unsafe to attempt at this time, BLE ataxia; BUE too weak for use of AD)              Stairs            Wheelchair Mobility    Modified Rankin (Stroke Patients Only)       Balance Overall balance assessment: Needs assistance   Sitting balance-Leahy Scale: Good Sitting balance - Comments: slow righting responses in trunk due to heavy required effort, but able to lean partially without collapse   Standing balance support: Bilateral upper extremity supported;During functional activity Standing balance-Leahy Scale: Zero                               Pertinent Vitals/Pain Pain Assessment: No/denies pain    Home Living Family/patient expects to be discharged to::  (ILF at [facility] Living Arrangements: Alone                    Prior Function Level of Independence: Independent         Comments: Still independent with IADL; no prior falls history; walked ~1 miile for activity.     Hand Dominance        Extremity/Trunk Assessment   Upper Extremity Assessment Upper Extremity Assessment: Generalized weakness (more proximal deficits of shoulder girdle than distal)    Lower Extremity Assessment Lower Extremity Assessment: Generalized weakness (generally weak, especially hip/knee  control)       Communication      Cognition Arousal/Alertness: Awake/alert Behavior During Therapy: WFL for tasks assessed/performed Overall Cognitive Status: Within Functional Limits for tasks assessed                                        General Comments      Exercises Total Joint Exercises Long Arc Quad: AROM;15 reps;Seated General Exercises - Upper Extremity Shoulder Flexion: AAROM;Both;10 reps;Supine    Assessment/Plan    PT Assessment Patient needs continued PT services  PT Problem List Decreased strength;Decreased range of motion;Decreased activity tolerance;Decreased balance;Decreased mobility;Decreased knowledge of precautions;Decreased safety awareness;Decreased knowledge of use of DME       PT Treatment Interventions Balance training;DME instruction;Gait training;Stair training;Functional mobility training;Therapeutic activities;Therapeutic exercise;Patient/family education    PT Goals (Current goals can be found in the Care Plan section)  Acute Rehab PT Goals Patient Stated Goal: regain independent mobility PT Goal Formulation: With patient Time For Goal Achievement: 04/12/20 Potential to Achieve Goals: Fair    Frequency 7X/week   Barriers to discharge Decreased caregiver support      Co-evaluation               AM-PAC PT "6 Clicks" Mobility  Outcome Measure Help needed turning from your back to your side while in a flat bed without using bedrails?: A Lot Help needed moving from lying on your back to sitting on the side of a flat bed without using bedrails?: A Lot Help needed moving to and from a bed to a chair (including a wheelchair)?: A Lot Help needed standing up from a chair using your arms (e.g., wheelchair or bedside chair)?: A Lot Help needed to walk in hospital room?: Total Help needed climbing 3-5 steps with a railing? : Total 6 Click Score: 10    End of Session Equipment Utilized During Treatment: Gait belt Activity Tolerance: Patient tolerated treatment well;Patient limited by fatigue;No increased pain Patient left: with call bell/phone within reach;in chair;with chair alarm set;with nursing/sitter in room Nurse Communication: Mobility status PT Visit Diagnosis: Unsteadiness on feet (R26.81);Other abnormalities of gait and mobility (R26.89);Difficulty in walking, not elsewhere classified (R26.2);Muscle weakness (generalized) (M62.81)    Time:  7106-2694 PT Time Calculation (min) (ACUTE ONLY): 25 min   Charges:   PT Evaluation $PT Eval Moderate Complexity: 1 Mod PT Treatments $Therapeutic Exercise: 8-22 mins        11:51 AM, 03/29/20 Etta Grandchild, PT, DPT Physical Therapist - [facility]  [phone] ([place])    [place] C 03/29/2020, 11:49 AM

## 2020-03-29 NOTE — Progress Notes (Signed)
NIF -20,FVC 1.53 L

## 2020-03-30 LAB — CBC
HCT: 38.4 % (ref 36.0–46.0)
Hemoglobin: 13.1 g/dL (ref 12.0–15.0)
MCH: 30.5 pg (ref 26.0–34.0)
MCHC: 34.1 g/dL (ref 30.0–36.0)
MCV: 89.5 fL (ref 80.0–100.0)
Platelets: 215 10*3/uL (ref 150–400)
RBC: 4.29 MIL/uL (ref 3.87–5.11)
RDW: 12.4 % (ref 11.5–15.5)
WBC: 6.5 10*3/uL (ref 4.0–10.5)
nRBC: 0 % (ref 0.0–0.2)

## 2020-03-30 LAB — BASIC METABOLIC PANEL
Anion gap: 4 — ABNORMAL LOW (ref 5–15)
BUN: 12 mg/dL (ref 8–23)
CO2: 27 mmol/L (ref 22–32)
Calcium: 8.1 mg/dL — ABNORMAL LOW (ref 8.9–10.3)
Chloride: 98 mmol/L (ref 98–111)
Creatinine, Ser: 0.44 mg/dL (ref 0.44–1.00)
GFR calc Af Amer: >60 mL/min (ref >60)
GFR calc non Af Amer: >60 mL/min (ref >60)
Glucose, Bld: 108 mg/dL — ABNORMAL HIGH (ref 70–99)
Potassium: 3.8 mmol/L (ref 3.5–5.1)
Sodium: 129 mmol/L — ABNORMAL LOW (ref 135–145)

## 2020-03-30 LAB — IGG, IGA, IGM
IgA: 345 mg/dL (ref 64–422)
IgG (Immunoglobin G), Serum: 1349 mg/dL (ref 586–1602)
IgM (Immunoglobulin M), Srm: 182 mg/dL (ref 26–217)

## 2020-03-30 MED ORDER — MAGNESIUM CITRATE PO SOLN
1.0000 | Freq: Once | ORAL | Status: AC
  Administered 2020-03-31: 1 via ORAL
  Filled 2020-03-30 (×2): qty 296

## 2020-03-30 MED ORDER — IMMUNE GLOBULIN (HUMAN) 10 GM/100ML IV SOLN
400.0000 mg/kg | INTRAVENOUS | Status: DC
  Administered 2020-03-30 – 2020-04-01 (×3): 30 g via INTRAVENOUS
  Filled 2020-03-30 (×4): qty 300

## 2020-03-30 MED ORDER — BISACODYL 10 MG RE SUPP
10.0000 mg | Freq: Once | RECTAL | Status: AC
  Administered 2020-03-30: 10 mg via RECTAL
  Filled 2020-03-30: qty 1

## 2020-03-30 NOTE — Progress Notes (Signed)
Shift Summary:  Patient had no acute events overnight.  Patient tolerated IVIG well.  Patient IVF continued per orders. Patient c/o pain that was relieved by repositioning in bed. Urinary output adequate, but no BM. Will continue to monitor.

## 2020-03-30 NOTE — Progress Notes (Signed)
NIF -24,FVC 1.58 L

## 2020-03-30 NOTE — Progress Notes (Signed)
PROGRESS NOTE    Alison Huffman  VEL:[id number] DOB: 1941/04/03 DOA: 03/27/2020 PCP: Leone Haven, MD   Brief Narrative:  79 year old female with past medical history for coronary artery disease, GERD, second-degree heart block s/p pacemaker hypertension, psoriasis, and arthritis.  She presents with  progressive numbness and weakness.  She reported progressive ascending weakness, initially lower extremities then involving the upper extremities. Her symptoms appear 2 days after her second dose of Enberl (03/15/20).  As an outpatient she was diagnosed with neuropathy.  She presented to the ED due to persistent and worsening symptoms. Neurology was consulted, LP with protein of only 33, no cytoalbumin neurological dissociation and she was started on IVIG due to persistent sensory neuropathy most likely secondary to Enberl.  Subjective: Patient was complaining of worsening weakness.  No bowel movement with magnesium citrate yesterday. Was complaining of becoming extremely fatigued after working with PT.  Assessment & Plan:   Principal Problem:   Acute motor and sensory axonal neuropathy Active Problems:   Hypertension   OSA (obstructive sleep apnea)   GERD (gastroesophageal reflux disease)   Numbness and tingling of lower extremity   Numbness and tingling of both feet   Ambulatory dysfunction  Acute motor and sensory neuropathy, in the setting of Enblrel use. Lumbar puncture with 33 protein. Patient continue to have significant ambulatory dysfunction, this am was not able to stand by herself and required significant assistance. B12 [id number].  Repeat CT neck without any acute abnormalities. Chest x-ray with bilateral basal atelectasis.  Patient is saturating well on room air. Denies any shortness of breath. Due to worsening tingling and numbness neurology decided to place her on IVIG X 5 days-Day 2 today. Patient tolerated first dose well. -PT/OT evaluation are recommending SNF  placement. -Continue to monitor respiratory status.  Constipation/abdominal distention.  KUB with right colonic stool burden. No BM yesterday with magnesium citrate. 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 -Try giving magnesium citrate and Dulcolax suppository today other. -Regular bowel regimen.  Hypertension. Pressure mildly elevated. -Continue home valsartan.  Psoriasis.  Patient was treated with home manner for 2 years with no response and recently started on Enbrel, most likely the cause of her current symptoms. -You should not get that medication again. -Follow-up with rheumatology as an outpatient.  GERD. -Continue home antacid.  Hyponatremia. Really thought to be due to poor p.o. intake as it shows some improvement with IV hydration. Worsened again with sodium of 129 today. 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 She was advised for surgery which has not been done yet. 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 Patient is receiving some fluids by neurology with IVIG. -Continue to monitor.  Objective: Vitals:   03/30/20 0016 03/30/20 0356 03/30/20 0745 03/30/20 1548  BP: (!) 151/63 (!) 157/67 (!) 152/67 (!) 149/72  Pulse: 78 75 69 72  Resp: 16 16 16 19   Temp: 97.6 F (36.4 C) 97.7 F (36.5 C) 97.6 F (36.4 C) 97.7 F (36.5 C)  TempSrc: Oral Oral Oral Oral  SpO2: 94% 93% 96% 93%  Weight:      Height:        Intake/Output Summary (Last 24 hours) at 03/30/2020 1741 Last data filed at 03/30/2020 1428 Gross per 24 hour  Intake 1143 ml  Output 350 ml  Net 793 ml   Filed Weights   03/27/20 1024  Weight: 76.2 kg    Examination:  General exam: Appears calm and comfortable. Sitting in chair. Respiratory system: Clear bilaterally, respiratory effort normal. Cardiovascular system: S1 & S2 heard, RRR. No JVD, murmurs,or rubs, Gastrointestinal system: Soft, nontender, mildly distended, bowel sounds  positive. Central nervous system: Alert and oriented.  4-/5 strength in upper extremities, 4/5 in lower extremities with decreased sensation in upper and lower extremities, more pronounced in hands and feet. Extremities: No edema, no cyanosis, pulses intact and symmetrical. Skin: No rashes, lesions or ulcers Psychiatry: Judgement and insight appear normal.   DVT prophylaxis: Lovenox Code Status: Full Family Communication: Niece was updated on phone. Disposition Plan:  Status is: Inpatient  Remains inpatient appropriate because:Inpatient level of care appropriate due to severity of illness   Dispo: The patient is from: Home              Anticipated d/c is to: SNF              Anticipated d/c date is: > 3 days              Patient currently is not medically stable to d/c.  Patient was started on IVIG for 5 days , day 2 today.  Consultants:   Neurology  Procedures:  LP  Antimicrobials:   Data Reviewed: I have personally reviewed following labs and imaging studies  CBC: Recent Labs  Lab 03/25/20 0950 03/27/20 1029 03/28/20 0548 03/30/20 0324  WBC 5.4 5.7 5.5 6.5  NEUTROABS 3.8  --   --   --   HGB 13.5 12.8 13.7 13.1  HCT 40.8 38.3 39.8 38.4  MCV 92.1 90.1 88.2 89.5  PLT 228 226 223 768   Basic Metabolic Panel: Recent Labs  Lab 03/25/20 1023 03/27/20 1029 03/28/20 0548 03/29/20 0527 03/30/20 0324  NA 141 130* 131* 133* 129*  K 4.2 4.0 3.9 3.9 3.8  CL 106 99 98 101 98  CO2 28 22 24 26 27   GLUCOSE 99 102* 97 105* 108*  BUN 10 9 10 12 12   CREATININE 0.61 0.47 0.50 0.57 0.44  CALCIUM 9.2 8.9 9.0 8.7* 8.1*   GFR: Estimated Creatinine Clearance: 58.5 mL/min (by C-G formula based on SCr of 0.44 mg/dL). Liver Function Tests: Recent Labs  Lab 03/24/20 1438 03/25/20 1023  AST 15 16  ALT 13 15  ALKPHOS 87 78  BILITOT 0.6 1.0  PROT 7.0 7.2  ALBUMIN 4.1 3.7   Recent Labs  Lab 03/25/20 1023  LIPASE 26   No results for input(s): AMMONIA in the last 168  hours. Coagulation Profile: No results for input(s): INR, PROTIME in the last 168 hours. Cardiac Enzymes: No results for input(s): CKTOTAL, CKMB, CKMBINDEX, TROPONINI in the last 168 hours. BNP (last 3 results) No results for input(s): PROBNP in the last 8760 hours. HbA1C: No results for input(s): HGBA1C in the last 72 hours. CBG: No results for input(s): GLUCAP in the last 168 hours. Lipid Profile: No results for input(s): CHOL, HDL, LDLCALC, TRIG, CHOLHDL, LDLDIRECT in the last 72 hours. Thyroid Function Tests: No results for input(s): TSH, T4TOTAL, FREET4, T3FREE, THYROIDAB in the last 72 hours. Anemia Panel: No results for input(s): VITAMINB12, FOLATE, FERRITIN, TIBC, IRON, RETICCTPCT in the last 72 hours. Sepsis Labs: No results for input(s): PROCALCITON, LATICACIDVEN in the last 168 hours.  Recent Results (from the past 240 hour(s))  SARS Coronavirus 2 by RT PCR (hospital order, performed in [facility] hospital lab) Nasopharyngeal Nasopharyngeal Swab     Status: None   Collection Time: 03/27/20  1:55 PM   Specimen: Nasopharyngeal Swab  Result Value Ref Range Status   SARS Coronavirus 2 NEGATIVE NEGATIVE Final    Comment: (NOTE) SARS-CoV-2 target nucleic acids are NOT DETECTED.  The SARS-CoV-2 RNA is generally detectable in upper and lower respiratory specimens during the acute phase of infection. The lowest concentration of SARS-CoV-2 viral copies this assay can detect is 250 copies / mL. A negative result does not preclude SARS-CoV-2 infection and should not be used as the sole basis for treatment or other patient management decisions.  A negative result may occur with improper specimen collection / handling, submission of specimen other than nasopharyngeal swab, presence of viral mutation(s) within the areas targeted by this assay, and inadequate number of viral copies (<250 copies / mL). A negative result must be combined with clinical observations, patient history,  and epidemiological information.  Fact Sheet for Patients:   StrictlyIdeas.no  Fact Sheet for Healthcare Providers: BankingDealers.co.za  This test is not yet approved or  cleared by the [place] FDA and has been authorized for detection and/or diagnosis of SARS-CoV-2 by FDA under an Emergency Use Authorization (EUA).  This EUA will remain in effect (meaning this test can be used) for the duration of the COVID-19 declaration under Section 564(b)(1) of the Act, 21 U.S.C. section 360bbb-3(b)(1), unless the authorization is terminated or revoked sooner.  Performed at [facility], [address] [place], [place] [address]      Radiology Studies: DG Chest 2 View  Result Date: 03/29/2020 CLINICAL DATA:  Shortness of breath, abdominal distension, no bowel movement for 5-6 days, history coronary artery disease, diastolic dysfunction, hypertension EXAM: CHEST - 2 VIEW COMPARISON:  07/26/2018 FINDINGS: LEFT subclavian sequential transvenous pacemaker leads project at RIGHT atrium and RIGHT ventricle. Normal heart size, mediastinal contours, and pulmonary vascularity. Atherosclerotic calcification aorta. Minimal bibasilar atelectasis. Otherwise clear. No infiltrate, pleural effusion or pneumothorax. Bones demineralized. IMPRESSION: Minimal bibasilar atelectasis. Aortic Atherosclerosis (ICD10-I70.0). Electronically Signed   By: Lavonia Dana M.D.   On: 03/29/2020 11:39   DG Abd 1 View  Result Date: 03/29/2020 CLINICAL DATA:  Shortness of breath, abdominal distension, no bowel movement for 5-6 days EXAM: ABDOMEN - 1 VIEW COMPARISON:  None FINDINGS: Increased stool in RIGHT colon. Remaining bowel gas pattern normal. Scattered air-filled loops of nondistended large and small bowel throughout remainder of abdomen. No bowel wall thickening or obstruction. Bones demineralized. IMPRESSION: Increased stool RIGHT colon. Electronically Signed   By:  Lavonia Dana M.D.   On: 03/29/2020 11:40   CT C-SPINE NO CHARGE  Result Date: 03/29/2020 CLINICAL DATA:  Worsening numbness and tingling.  Neck pain. EXAM: CT CERVICAL SPINE WITHOUT CONTRAST TECHNIQUE: Multidetector CT imaging of the cervical spine was performed without intravenous contrast. Multiplanar CT image reconstructions were also generated. COMPARISON:  CT angio head and neck 03/27/2020 FINDINGS: Request was made for cervical spine reconstruction from the CT angio head and neck data performed on 03/27/2020. Alignment: Normal.  Mild scoliosis. Skull base and vertebrae: Negative for fracture. No focal skeletal lesion identified. Soft tissues and spinal canal: Extensive venous reflux into the paravertebral veins extending into the vertebral bodies. 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 Left arm contrast injection. No soft tissue mass or edema.  Vascular findings per CTA report Disc levels:  C2-3: Negative C3-4: Mild disc degeneration and spurring without significant spinal or foraminal stenosis. C4-5: Mild disc degeneration and uncinate spurring bilaterally without significant stenosis. C5-6: Negative C6-7: Mild disc  degeneration and mild spurring. No significant stenosis C7-T1: Negative Upper chest: Mild ground-glass density in the right upper lobe could represent acute or chronic airspace disease. No effusion. Other: None IMPRESSION: No acute abnormality in the cervical spine.  No fracture or mass Mild degenerative changes cervical spine without spinal or foraminal encroachment. 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 Electronically Signed   By: Franchot Gallo M.D.   On: 03/29/2020 12:52    Scheduled Meds: . cholecalciferol  2,000 Units Oral Daily  . clobetasol cream  1 application Topical Q Sun  . enoxaparin (LOVENOX) injection  40 mg Subcutaneous Q24H  . famotidine  20 mg Oral BID  . irbesartan  75 mg  Oral Daily  . magnesium citrate  1 Bottle Oral Once  . psyllium  1 packet Oral Daily  . sodium chloride flush  3 mL Intravenous Q12H   Continuous Infusions: . sodium chloride    . sodium chloride 75 mL/hr at 03/30/20 0545  . Immune Globulin 10%       LOS: 2 days   Time spent: 40 minutes.  Lorella Nimrod, MD [facility]  If 7PM-7AM, please contact night-coverage [email or web address]  03/30/2020, 5:41 PM   This record has been created using Systems analyst. Errors have been sought and corrected,but may not always be located. Such creation errors do not reflect on the standard of care.

## 2020-03-30 NOTE — Progress Notes (Signed)
NIF -25 , FVC 1.07 L

## 2020-03-30 NOTE — Progress Notes (Signed)
Subjective: Patient reports no worsening overnight but does not feel that she is getting better either.  Tolerated initial infusion of IVIg well.    Objective: Current vital signs: BP (!) 152/67 (BP Location: Right Arm)   Pulse 69   Temp 97.6 F (36.4 C) (Oral)   Resp 16   Ht 5\' 6"  (1.676 m)   Wt 76.2 kg   SpO2 96%   BMI 27.12 kg/m  Vital signs in last 24 hours: Temp:  [97.5 F (36.4 C)-98.9 F (37.2 C)] 97.6 F (36.4 C) (06/29 0745) Pulse Rate:  [69-88] 69 (06/29 0745) Resp:  [16-20] 16 (06/29 0745) BP: (135-161)/(55-88) 152/67 (06/29 0745) SpO2:  [93 %-100 %] 96 % (06/29 0745)  Intake/Output from previous day: 06/28 0701 - 06/29 0700 In: 1203 [P.O.:900; I.V.:303] Out: 350 [Urine:350] Intake/Output this shift: Total I/O In: 360 [P.O.:360] Out: -  Nutritional status:  Diet Order            Diet 2 gram sodium Room service appropriate? Yes; Fluid consistency: Thin  Diet effective now                 Neurologic Exam: Mental Status: Alert, oriented, thought content appropriate.  Speech fluent without evidence of aphasia.  Able to follow 3 step commands without difficulty. Cranial Nerves: II: Visual fields grossly normal, pupils equal, round, reactive to light and accommodation III,IV, VI: ptosis not present, extra-ocular motions intact bilaterally V,VII: smile symmetric but opens mouth very little for conversation, facial light touch sensation normal bilaterally VIII: hearing normal bilaterally IX,X: gag reflex present XI: bilateral shoulder shrug XII: midline tongue extension Motor: Unable to lift arms or maintain against gravity at the shoulders.  Elbow flexion and extension as well as hand grip at 4/5.  Able to lift both lower extremity about 6 inches off the bed.  5/5 ankle flexion and extension noted. Sensory: Pinprick and light touch decreased in the lower extremities to include the abdomen and buttocks.  Also decreased in the hands bilaterally Deep Tendon  Reflexes: 1+ in the upper extremities and absent in the lower extremities.   Plantars: Right: mute                              Left: mute  Lab Results: Basic Metabolic Panel: Recent Labs  Lab 03/25/20 1023 03/25/20 1023 03/27/20 1029 03/27/20 1029 03/28/20 0548 03/29/20 0527 03/30/20 0324  NA 141  --  130*  --  131* 133* 129*  K 4.2  --  4.0  --  3.9 3.9 3.8  CL 106  --  99  --  98 101 98  CO2 28  --  22  --  24 26 27   GLUCOSE 99  --  102*  --  97 105* 108*  BUN 10  --  9  --  10 12 12   CREATININE 0.61  --  0.47  --  0.50 0.57 0.44  CALCIUM 9.2   < > 8.9   < > 9.0 8.7* 8.1*   < > = values in this interval not displayed.    Liver Function Tests: Recent Labs  Lab 03/24/20 1438 03/25/20 1023  AST 15 16  ALT 13 15  ALKPHOS 87 78  BILITOT 0.6 1.0  PROT 7.0 7.2  ALBUMIN 4.1 3.7   Recent Labs  Lab 03/25/20 1023  LIPASE 26   No results for input(s): AMMONIA in the last 168 hours.  CBC: Recent Labs  Lab 03/25/20 0950 03/27/20 1029 03/28/20 0548 03/30/20 0324  WBC 5.4 5.7 5.5 6.5  NEUTROABS 3.8  --   --   --   HGB 13.5 12.8 13.7 13.1  HCT 40.8 38.3 39.8 38.4  MCV 92.1 90.1 88.2 89.5  PLT 228 226 223 215    Cardiac Enzymes: No results for input(s): CKTOTAL, CKMB, CKMBINDEX, TROPONINI in the last 168 hours.  Lipid Panel: No results for input(s): CHOL, TRIG, HDL, CHOLHDL, VLDL, LDLCALC in the last 168 hours.  CBG: No results for input(s): GLUCAP in the last 168 hours.  Microbiology: Results for orders placed or performed during the hospital encounter of 03/27/20  SARS Coronavirus 2 by RT PCR (hospital order, performed in [facility] hospital lab) Nasopharyngeal Nasopharyngeal Swab     Status: None   Collection Time: 03/27/20  1:55 PM   Specimen: Nasopharyngeal Swab  Result Value Ref Range Status   SARS Coronavirus 2 NEGATIVE NEGATIVE Final    Comment: (NOTE) SARS-CoV-2 target nucleic acids are NOT DETECTED.  The SARS-CoV-2 RNA is generally detectable  in upper and lower respiratory specimens during the acute phase of infection. The lowest concentration of SARS-CoV-2 viral copies this assay can detect is 250 copies / mL. A negative result does not preclude SARS-CoV-2 infection and should not be used as the sole basis for treatment or other patient management decisions.  A negative result may occur with improper specimen collection / handling, submission of specimen other than nasopharyngeal swab, presence of viral mutation(s) within the areas targeted by this assay, and inadequate number of viral copies (<250 copies / mL). A negative result must be combined with clinical observations, patient history, and epidemiological information.  Fact Sheet for Patients:   StrictlyIdeas.no  Fact Sheet for Healthcare Providers: BankingDealers.co.za  This test is not yet approved or  cleared by the [place] FDA and has been authorized for detection and/or diagnosis of SARS-CoV-2 by FDA under an Emergency Use Authorization (EUA).  This EUA will remain in effect (meaning this test can be used) for the duration of the COVID-19 declaration under Section 564(b)(1) of the Act, 21 U.S.C. section 360bbb-3(b)(1), unless the authorization is terminated or revoked sooner.  Performed at [facility], [address] [place], [place] [address]     Coagulation Studies: No results for input(s): LABPROT, INR in the last 72 hours.  Imaging: DG Chest 2 View  Result Date: 03/29/2020 CLINICAL DATA:  Shortness of breath, abdominal distension, no bowel movement for 5-6 days, history coronary artery disease, diastolic dysfunction, hypertension EXAM: CHEST - 2 VIEW COMPARISON:  07/26/2018 FINDINGS: LEFT subclavian sequential transvenous pacemaker leads project at RIGHT atrium and RIGHT ventricle. Normal heart size, mediastinal contours, and pulmonary vascularity. Atherosclerotic calcification aorta.  Minimal bibasilar atelectasis. Otherwise clear. No infiltrate, pleural effusion or pneumothorax. Bones demineralized. IMPRESSION: Minimal bibasilar atelectasis. Aortic Atherosclerosis (ICD10-I70.0). Electronically Signed   By: Lavonia Dana M.D.   On: 03/29/2020 11:39   DG Abd 1 View  Result Date: 03/29/2020 CLINICAL DATA:  Shortness of breath, abdominal distension, no bowel movement for 5-6 days EXAM: ABDOMEN - 1 VIEW COMPARISON:  None FINDINGS: Increased stool in RIGHT colon. Remaining bowel gas pattern normal. Scattered air-filled loops of nondistended large and small bowel throughout remainder of abdomen. No bowel wall thickening or obstruction. Bones demineralized. IMPRESSION: Increased stool RIGHT colon. Electronically Signed   By: Lavonia Dana M.D.   On: 03/29/2020 11:40   CT C-SPINE NO CHARGE  Result  Date: 03/29/2020 CLINICAL DATA:  Worsening numbness and tingling.  Neck pain. EXAM: CT CERVICAL SPINE WITHOUT CONTRAST TECHNIQUE: Multidetector CT imaging of the cervical spine was performed without intravenous contrast. Multiplanar CT image reconstructions were also generated. COMPARISON:  CT angio head and neck 03/27/2020 FINDINGS: Request was made for cervical spine reconstruction from the CT angio head and neck data performed on 03/27/2020. Alignment: Normal.  Mild scoliosis. Skull base and vertebrae: Negative for fracture. No focal skeletal lesion identified. Soft tissues and spinal canal: Extensive venous reflux into the paravertebral veins extending into the vertebral bodies. 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 Left arm contrast injection. No soft tissue mass or edema.  Vascular findings per CTA report Disc levels:  C2-3: Negative C3-4: Mild disc degeneration and spurring without significant spinal or foraminal stenosis. C4-5: Mild disc degeneration and uncinate spurring bilaterally without significant stenosis. C5-6: Negative C6-7: Mild disc degeneration and mild  spurring. No significant stenosis C7-T1: Negative Upper chest: Mild ground-glass density in the right upper lobe could represent acute or chronic airspace disease. No effusion. Other: None IMPRESSION: No acute abnormality in the cervical spine.  No fracture or mass Mild degenerative changes cervical spine without spinal or foraminal encroachment. 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 Electronically Signed   By: Franchot Gallo M.D.   On: 03/29/2020 12:52    Medications:  I have reviewed the patient's current medications. Scheduled: . cholecalciferol  2,000 Units Oral Daily  . clobetasol cream  1 application Topical Q Sun  . enoxaparin (LOVENOX) injection  40 mg Subcutaneous Q24H  . famotidine  20 mg Oral BID  . irbesartan  75 mg Oral Daily  . psyllium  1 packet Oral Daily  . sodium chloride flush  3 mL Intravenous Q12H    Assessment/Plan: 79 y.o. female with a past medical history of CAD, pacemaker, gastric reflux, hypertension, hyperlipidemia, psoriasis, arthritis, presents to the emergency department for worsening numbness and tingling. According to the patient she got her Enbrel injection approximately 1 week ago with total 2 doses first one being beginning of June. She states since she has been experiencing tingling in her feet that has been progressing up her body that started last Wednesday. Patient states initially she saw her doctor who diagnosed her with neuropathy however the numbness and tingling sensation continued to progress up her legs and into her upper extremities as well. Weakness has progressed. Reports she is stable today.  No SOB noted on examination today.  NIF and VC have been stable.  CT of the cervical spine personally reviewed and shows no etiology for her symptoms    Recommendations:  1. Continued NIF and VC q 12 hours  2. Patient reports that she is on CPAP at home.  Would continue here at night.   3.  Agree with continued measures to alleviate constipation 4. Telemetry 5. Agree with addressing hyponatremia.  Daily BMET and CBC 6. Continue IVIg at 400mg /kg/day.  Today is day 2 of 5.  [name]     LOS: 2 days   Alexis Goodell, MD Neurology [phone] 03/30/2020  11:03 AM

## 2020-03-30 NOTE — Progress Notes (Signed)
Physical Therapy Treatment Patient Details Name: Alison Huffman MRN: [id number] DOB: Feb 16, 1941 Today's Date: 03/30/2020    History of Present Illness Alison Huffman is an 79 y/o F with PMH: CAD, PPM, gastric reflux, hypertension, hyperlipidemia, psoriasis, arthritis, who presented to the [facility] ED on 6/24 d/t worsening numbness and tingling.  According to the patient she got her Enbrel injection approximately 1 week ago with total 2 doses first one being beginning of June (reports she was previously on Humira). She states since she has been experiencing tingling in her feet that has been progressing up her body that started last Wednesday. Her PCP initially dx with neuropathy, however the numbness and tingling continued to progress up her LEs and into her UEs as well. 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 LP was unremarkable.    PT Comments     Pt in bed upon entry, requests assist with toilet transfer which is granted, MaxA SPT c Lt knee block to [facility]. Pt requires more lift assist this date. Pt assisted back to recliner via NA/RN reports that staff struggling with success for transfers d/t weakness. Pt participates in chair based HEP, shoulder and hip movements remain the most weak. Pt able to perform floor taps BUE without gross loss of stability. Pt educated on hydraulic STS lift for function, also assuring lift can be safe and appropriate with adequate knee block given patient's level of knee ataxia. Pt very fatigued after session and notably weaker. Author reviewed safe use of lift with NA.      Follow Up Recommendations  SNF;Supervision for mobility/OOB     Equipment Recommendations  None recommended by PT    Recommendations for Other Services       Precautions / Restrictions Precautions Precautions: Fall Precaution Comments: Lt knee buckling issues    Mobility  Bed Mobility Overal bed mobility: Needs Assistance Bed Mobility: Supine to Sit      Supine to sit: Mod assist;HOB elevated        Transfers Overall transfer level: Needs assistance Equipment used: None Transfers: Sit to/from Stand Sit to Stand: From elevated surface;Total assist;+2 safety/equipment Stand pivot transfers: Max assist       General transfer comment: w/ Lt knee blocking to prevent buckle; seems more fatigued and weak this date compared to one day ago. (trial 3 STS with STS lift)  Ambulation/Gait                 Stairs             Wheelchair Mobility    Modified Rankin (Stroke Patients Only)       Balance                                            Cognition Arousal/Alertness: Awake/alert Behavior During Therapy: WFL for tasks assessed/performed Overall Cognitive Status: Within Functional Limits for tasks assessed                                        Exercises General Exercises - Lower Extremity Ankle Circles/Pumps: AROM;Strengthening;Both;15 reps;Seated Long Arc Quad: AROM;Both;15 reps;Seated Heel Slides: AROM;AAROM;Both;15 reps;Supine Hip ABduction/ADduction: AROM;Both;15 reps;Supine Hip Flexion/Marching: AROM;AAROM;Both;Seated Shoulder Exercises Shoulder Flexion: AAROM;Both;10 reps;Seated;Limitations (gerossly ataxic, taqctiel cues for hand proxemics to shoulder for shorter  humeral lever arm) Other Exercises Other Exercises: BUE floor taps c trunk extension return 1x10    General Comments        Pertinent Vitals/Pain Pain Assessment: No/denies pain    Home Living                      Prior Function            PT Goals (current goals can now be found in the care plan section) Acute Rehab PT Goals Patient Stated Goal: regain independent mobility PT Goal Formulation: With patient Time For Goal Achievement: 04/12/20 Potential to Achieve Goals: Fair Progress towards PT goals: Progressing toward goals    Frequency    7X/week      PT Plan Current  plan remains appropriate    Co-evaluation              AM-PAC PT "6 Clicks" Mobility   Outcome Measure  Help needed turning from your back to your side while in a flat bed without using bedrails?: A Lot Help needed moving from lying on your back to sitting on the side of a flat bed without using bedrails?: A Lot Help needed moving to and from a bed to a chair (including a wheelchair)?: Total Help needed standing up from a chair using your arms (e.g., wheelchair or bedside chair)?: Total Help needed to walk in hospital room?: Total Help needed climbing 3-5 steps with a railing? : Total 6 Click Score: 8    End of Session Equipment Utilized During Treatment: Gait belt Activity Tolerance: Patient tolerated treatment well;Patient limited by fatigue;No increased pain Patient left: with call bell/phone within reach;in chair;with chair alarm set;with nursing/sitter in room Nurse Communication: Mobility status PT Visit Diagnosis: Unsteadiness on feet (R26.81);Other abnormalities of gait and mobility (R26.89);Difficulty in walking, not elsewhere classified (R26.2);Muscle weakness (generalized) (M62.81)     Time: 1005-1050 PT Time Calculation (min) (ACUTE ONLY): 45 min  Charges:  $Therapeutic Exercise: 23-37 mins $Self Care/Home Management: 8-22                     12:22 PM, 03/30/20 Etta Grandchild, PT, DPT Physical Therapist - [facility]  [phone] ([place])     [place] C 03/30/2020, 12:17 PM

## 2020-03-31 LAB — BASIC METABOLIC PANEL
Anion gap: 5 (ref 5–15)
BUN: 11 mg/dL (ref 8–23)
CO2: 25 mmol/L (ref 22–32)
Calcium: 7.9 mg/dL — ABNORMAL LOW (ref 8.9–10.3)
Chloride: 94 mmol/L — ABNORMAL LOW (ref 98–111)
Creatinine, Ser: 0.55 mg/dL (ref 0.44–1.00)
GFR calc Af Amer: >60 mL/min (ref >60)
GFR calc non Af Amer: >60 mL/min (ref >60)
Glucose, Bld: 109 mg/dL — ABNORMAL HIGH (ref 70–99)
Potassium: 3.8 mmol/L (ref 3.5–5.1)
Sodium: 124 mmol/L — ABNORMAL LOW (ref 135–145)

## 2020-03-31 LAB — CBC
HCT: 37.7 % (ref 36.0–46.0)
Hemoglobin: 12.6 g/dL (ref 12.0–15.0)
MCH: 30.1 pg (ref 26.0–34.0)
MCHC: 33.4 g/dL (ref 30.0–36.0)
MCV: 90 fL (ref 80.0–100.0)
Platelets: 215 10*3/uL (ref 150–400)
RBC: 4.19 MIL/uL (ref 3.87–5.11)
RDW: 12.4 % (ref 11.5–15.5)
WBC: 6.1 10*3/uL (ref 4.0–10.5)
nRBC: 0 % (ref 0.0–0.2)

## 2020-03-31 MED ORDER — SODIUM CHLORIDE 1 G PO TABS
1.0000 g | ORAL_TABLET | Freq: Once | ORAL | Status: AC
  Administered 2020-03-31: 1 g via ORAL
  Filled 2020-03-31: qty 1

## 2020-03-31 NOTE — Progress Notes (Signed)
NIF -20 , FVC 1.34 L

## 2020-03-31 NOTE — Care Management Important Message (Signed)
Important Message  Patient Details  Name: Alison Huffman MRN: [id number] Date of Birth: [birth year]   Medicare Important Message Given:  Yes     [name] 03/31/2020, 11:08 AM

## 2020-03-31 NOTE — Progress Notes (Signed)
Physical Therapy Treatment Patient Details Name: Alison Huffman MRN: [id number] DOB: 1941-02-21 Today's Date: 03/31/2020    History of Present Illness Alison Huffman is an 79 y/o F with PMH: CAD, PPM, gastric reflux, hypertension, hyperlipidemia, psoriasis, arthritis, who presented to the [facility] ED on 6/24 d/t worsening numbness and tingling.  According to the patient she got her Enbrel injection approximately 1 week ago with total 2 doses first one being beginning of June (reports she was previously on Humira). She states since she has been experiencing tingling in her feet that has been progressing up her body that started last Wednesday. Her PCP initially dx with neuropathy, however the numbness and tingling continued to progress up her LEs and into her UEs as well. 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 LP was unremarkable.    PT Comments    Pt in chair upon arrival, awake agreeable to participate. Pt assisted to [place] SPT, then totalA SPT to EOB where she is able to sit tall, erect,un supported for 5 minutes without LOB. Pt assited to supine with totalA then continued with BLE HEP training. Limbs remain heavily weak, especially proximally, difficulty with fine motor control, lots of position error with leg movement. Pt requires assist for full available ROM. Will continue to follow. Transfer needs seems greater than yesterday or day before, and proximal BUE weakness also appears worse and more limiting.    Follow Up Recommendations  SNF;Supervision for mobility/OOB;Supervision/Assistance - 24 hour     Equipment Recommendations  None recommended by PT    Recommendations for Other Services       Precautions / Restrictions Precautions Precautions: Fall Precaution Comments: gross limb ataxia x4 Restrictions Weight Bearing Restrictions: No    Mobility  Bed Mobility Overal bed mobility: Needs Assistance Bed Mobility: Sit to Supine     Supine  to sit: Max assist;+2 for physical assistance        Transfers Overall transfer level: Needs assistance Equipment used: None Transfers: Sit to/from Stand   Stand pivot transfers: +2 safety/equipment;Total assist       General transfer comment: Difficult with obtaining hand hold position, due to difficulty with shoulder ataxia/weakness. limited capacity to produce force in LLE.  Ambulation/Gait                 Stairs             Wheelchair Mobility    Modified Rankin (Stroke Patients Only)       Balance Overall balance assessment: Needs assistance Sitting-balance support: Feet supported;No upper extremity supported Sitting balance-Leahy Scale: Good Sitting balance - Comments: able to sit upright still for 5 minutes Postural control: Left lateral lean Standing balance support: Bilateral upper extremity supported                                Cognition Arousal/Alertness: Awake/alert Behavior During Therapy: WFL for tasks assessed/performed Overall Cognitive Status: Within Functional Limits for tasks assessed                                        Exercises General Exercises - Lower Extremity Short Arc Quad: AROM;Both;15 reps;Supine Heel Slides: AROM;AAROM;Both;15 reps;Supine Hip ABduction/ADduction: AROM;Both;15 reps;Supine Other Exercises Other Exercises: pillow squeezes 10x3secH    General Comments  Pertinent Vitals/Pain Pain Assessment: No/denies pain    Home Living                      Prior Function            PT Goals (current goals can now be found in the care plan section) Acute Rehab PT Goals Patient Stated Goal: regain independent mobility PT Goal Formulation: With patient Time For Goal Achievement: 04/12/20 Potential to Achieve Goals: Fair Progress towards PT goals: Not progressing toward goals - comment    Frequency    7X/week      PT Plan Current plan remains  appropriate    Co-evaluation              AM-PAC PT "6 Clicks" Mobility   Outcome Measure  Help needed turning from your back to your side while in a flat bed without using bedrails?: Total Help needed moving from lying on your back to sitting on the side of a flat bed without using bedrails?: Total Help needed moving to and from a bed to a chair (including a wheelchair)?: Total Help needed standing up from a chair using your arms (e.g., wheelchair or bedside chair)?: Total Help needed to walk in hospital room?: Total Help needed climbing 3-5 steps with a railing? : Total 6 Click Score: 6    End of Session Equipment Utilized During Treatment: Gait belt Activity Tolerance: Patient tolerated treatment well;Patient limited by fatigue;No increased pain Patient left: with call bell/phone within reach;with nursing/sitter in room;in bed Nurse Communication: Mobility status PT Visit Diagnosis: Unsteadiness on feet (R26.81);Other abnormalities of gait and mobility (R26.89);Difficulty in walking, not elsewhere classified (R26.2);Muscle weakness (generalized) (M62.81)     Time: 1432-1500 PT Time Calculation (min) (ACUTE ONLY): 28 min  Charges:  $Therapeutic Exercise: 23-37 mins                     3:16 PM, 03/31/20 Etta Grandchild, PT, DPT Physical Therapist - [facility]  [phone] ([place])    [place] C 03/31/2020, 3:13 PM

## 2020-03-31 NOTE — NC FL2 (Signed)
[place] LEVEL OF CARE SCREENING TOOL     IDENTIFICATION  Patient Name: Alison Huffman Birthdate: 1941-05-07 Sex: female Admission Date (Current Location): 03/27/2020  [place] and [place] Number:  Engineering geologist and Address:  [facility], [address] [place], [place] [address]      Provider Number: [id number]  Attending Physician Name and Address:  Lorella Nimrod, MD  Relative Name and Phone Number:  Joelene Millin Niece [phone]    Current Level of Care: Hospital Recommended Level of Care: [place] Prior Approval Number:    Date Approved/Denied:   PASRR Number: [id number]  Discharge Plan: SNF    Current Diagnoses: Patient Active Problem List   Diagnosis Date Noted  . 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 Ambulatory dysfunction 03/28/2020  . Numbness and tingling of both feet 03/27/2020  . Numbness and tingling of lower extremity 03/24/2020  . Inversion of right nipple 10/01/2019  . Breast cancer screening 09/22/2019  . Constipation 03/24/2019  . Cyst of left ovary 01/15/2019  . History of gastroesophageal reflux (GERD) 12/12/2018  . Postmenopausal bleeding 09/30/2018  . Thickened endometrium 09/30/2018  . Bilateral shoulder pain 09/20/2018  . Bradycardia 07/22/2018  . Second degree AV block, Mobitz type II 07/22/2018  . Exertional dyspnea 03/21/2018  . Greater trochanteric bursitis of both hips 12/18/2017  . Encounter for long-term (current) use of high-risk medication 12/10/2017  . Psoriasis 09/27/2017  . Prediabetes 09/19/2017  . Stiffness of right hand joint 08/28/2017  . Leg swelling 08/28/2017  . 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 Allergic rhinitis 06/14/2017  . Ankle pain, right 06/14/2017  . History of carotid stenosis 06/14/2017  . Psoriatic arthritis ([place]) 06/14/2017  . Lichen sclerosus   . GERD (gastroesophageal reflux disease)   . Hypertension 03/02/2017  .  Hyperlipidemia 03/02/2017  . OSA (obstructive sleep apnea) 03/02/2017  . Eye exam, routine 03/02/2017  . Osteoporosis 03/02/2017    Orientation RESPIRATION BLADDER Height & Weight     Self, Time, Situation, Place  Normal Continent Weight: 76.2 kg Height:  5\' 6"  (167.6 cm)  BEHAVIORAL SYMPTOMS/MOOD NEUROLOGICAL BOWEL NUTRITION STATUS      Continent Diet (2 gram sodium)  AMBULATORY STATUS COMMUNICATION OF NEEDS Skin   Extensive Assist Verbally Normal                       Personal Care Assistance Level of Assistance  Dressing, Bathing Bathing Assistance: Limited assistance   Dressing Assistance: Limited assistance     Functional Limitations Info             SPECIAL CARE FACTORS FREQUENCY  PT (By licensed PT), OT (By licensed OT)     PT Frequency: 5 times per week OT Frequency: 5 times per week            Contractures Contractures Info: Not present    Additional Factors Info  Code Status, Allergies Code Status Info: full code Allergies Info: nickel           Current Medications (03/31/2020):  This is the current hospital active medication list Current Facility-Administered Medications  Medication Dose Route Frequency Provider Last Rate Last Admin  . acetaminophen (TYLENOL) tablet 650 mg  650 mg Oral Q6H PRN Agbata, Tochukwu, MD       Or  . acetaminophen (TYLENOL) suppository 650 mg  650 mg Rectal Q6H PRN Agbata, Tochukwu, MD      . acetaminophen (TYLENOL) tablet 325-650  mg  325-650 mg Oral Q6H PRN Agbata, Tochukwu, MD      . bisacodyl (DULCOLAX) suppository 10 mg  10 mg Rectal Daily PRN Lorella Nimrod, MD      . cholecalciferol (VITAMIN D) tablet 2,000 Units  2,000 Units Oral Daily Agbata, Tochukwu, MD   2,000 Units at 03/31/20 0946  . clobetasol cream (TEMOVATE) 0.86 % 1 application  1 application Topical Q Sun Agbata, Tochukwu, MD      . desonide (DESOWEN) 0.05 % ointment 1 application  1 application Topical BID PRN Agbata, Tochukwu, MD      .  enoxaparin (LOVENOX) injection 40 mg  40 mg Subcutaneous Q24H Agbata, Tochukwu, MD   40 mg at 03/30/20 1815  . famotidine (PEPCID) tablet 20 mg  20 mg Oral BID Agbata, Tochukwu, MD   20 mg at 03/31/20 0946  . Immune Globulin 10% (PRIVIGEN) IV infusion 30 g  400 mg/kg Intravenous Q24 Hr x 5 Alexis Goodell, MD 44 mL/hr at 03/30/20 2357 Rate Change at 03/30/20 2357  . irbesartan (AVAPRO) tablet 75 mg  75 mg Oral Daily Agbata, Tochukwu, MD   75 mg at 03/31/20 0946  . magnesium citrate solution 1 Bottle  1 Bottle Oral Once Lorella Nimrod, MD      . ondansetron (ZOFRAN) tablet 4 mg  4 mg Oral Q6H PRN Agbata, Tochukwu, MD       Or  . ondansetron (ZOFRAN) injection 4 mg  4 mg Intravenous Q6H PRN Agbata, Tochukwu, MD      . psyllium (HYDROCIL/METAMUCIL) packet 1 packet  1 packet Oral Daily Agbata, Tochukwu, MD   1 packet at 03/31/20 0947  . sodium chloride flush (NS) 0.9 % injection 3 mL  3 mL Intravenous Q12H Agbata, Tochukwu, MD   3 mL at 03/31/20 0947  . sodium chloride flush (NS) 0.9 % injection 3 mL  3 mL Intravenous PRN Agbata, Tochukwu, MD      . tacrolimus (PROTOPIC) 0.1 % ointment   Topical BID PRN Agbata, Tochukwu, MD         Discharge Medications: Please see discharge summary for a list of discharge medications.  Relevant Imaging Results:  Relevant Lab Results:   Additional Information SS# [id number]  Su Hilt, RN

## 2020-03-31 NOTE — TOC Initial Note (Signed)
Transition of Care [facility]) - Initial/Assessment Note    Patient Details  Name: Alison Huffman MRN: [id number] Date of Birth: 01/05/1941  [facility]) CM/SW Contact:    Su Hilt, RN Phone Number: 03/31/2020, 10:27 AM  Clinical Narrative:                 The patient lives at the [facility] and will be going to [facility] place at [place], she is agreeable to sending the bed request FL2, PASSR and bedrequest sent  Expected Discharge Plan: Skilled Nursing Facility Barriers to Discharge: Continued Medical Work up   Patient Goals and CMS Choice Patient states their goals for this hospitalization and ongoing recovery are:: get better      Expected Discharge Plan and Services Expected Discharge Plan: [place]       Living arrangements for the past 2 months: Apartment                                      Prior Living Arrangements/Services Living arrangements for the past 2 months: Apartment Lives with:: Self Patient language and need for interpreter reviewed:: Yes Do you feel safe going back to the place where you live?: Yes      Need for Family Participation in Patient Care: No (Comment) Care giver support system in place?: Yes (comment)   Criminal Activity/Legal Involvement Pertinent to Current Situation/Hospitalization: No - Comment as needed  Activities of Daily Living Home Assistive Devices/Equipment: None ADL Screening (condition at time of admission) Patient's cognitive ability adequate to safely complete daily activities?: Yes Is the patient deaf or have difficulty hearing?: No Does the patient have difficulty seeing, even when wearing glasses/contacts?: No Does the patient have difficulty concentrating, remembering, or making decisions?: No Patient able to express need for assistance with ADLs?: Yes Does the patient have difficulty dressing or bathing?: No Independently performs ADLs?: Yes (appropriate for  developmental age) Does the patient have difficulty walking or climbing stairs?: Yes Weakness of Legs: Both Weakness of Arms/Hands: Both  Permission Sought/Granted   Permission granted to share information with : Yes, Verbal Permission Granted     Permission granted to share info w AGENCY: [facility]        Emotional Assessment Appearance:: Appears stated age Attitude/Demeanor/Rapport: Engaged Affect (typically observed): Appropriate Orientation: : Oriented to Self, Oriented to Place, Oriented to  Time, Oriented to Situation Alcohol / Substance Use: Not Applicable Psych Involvement: No (comment)  Admission diagnosis:  Weakness [R53.1] Numbness and tingling of both feet [R20.0, R20.2] Ambulatory dysfunction [R26.2] Patient Active Problem List   Diagnosis Date Noted  . 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 Ambulatory dysfunction 03/28/2020  . Numbness and tingling of both feet 03/27/2020  . Numbness and tingling of lower extremity 03/24/2020  . Inversion of right nipple 10/01/2019  . Breast cancer screening 09/22/2019  . Constipation 03/24/2019  . Cyst of left ovary 01/15/2019  . History of gastroesophageal reflux (GERD) 12/12/2018  . Postmenopausal bleeding 09/30/2018  . Thickened endometrium 09/30/2018  . Bilateral shoulder pain 09/20/2018  . Bradycardia 07/22/2018  . Second degree AV block, Mobitz type II 07/22/2018  . Exertional dyspnea 03/21/2018  . Greater trochanteric bursitis of both hips 12/18/2017  . Encounter for long-term (current) use of high-risk medication 12/10/2017  . Psoriasis 09/27/2017  . Prediabetes 09/19/2017  . Stiffness of right  hand joint 08/28/2017  . Leg swelling 08/28/2017  . 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 Allergic rhinitis 06/14/2017  . Ankle pain, right 06/14/2017  . History of carotid stenosis 06/14/2017  . Psoriatic arthritis ([place]) 06/14/2017  . Lichen sclerosus   . GERD (gastroesophageal reflux  disease)   . Hypertension 03/02/2017  . Hyperlipidemia 03/02/2017  . OSA (obstructive sleep apnea) 03/02/2017  . Eye exam, routine 03/02/2017  . Osteoporosis 03/02/2017   PCP:  Leone Haven, MD Pharmacy:   [place], [place] - [place] [address] Phone: [phone] Fax: [phone]  [facility] [place], [place] [address] [place] [address] Phone: [phone] Fax: [phone]     Social Determinants of Health (SDOH) Interventions    Readmission Risk Interventions No flowsheet data found.

## 2020-03-31 NOTE — Progress Notes (Signed)
NIF - 22,FVC 1.11 L

## 2020-03-31 NOTE — Progress Notes (Signed)
PROGRESS NOTE    Alison Huffman  PYK:[id number] DOB: [birth year] DOA: 03/27/2020 PCP: Leone Haven, MD   Brief Narrative:  79 year old female with past medical history for coronary artery disease, GERD, second-degree heart block s/p pacemaker hypertension, psoriasis, and arthritis.  She presents with  progressive numbness and weakness.  She reported progressive ascending weakness, initially lower extremities then involving the upper extremities. Her symptoms appear 2 days after her second dose of Enberl (03/15/20).  As an outpatient she was diagnosed with neuropathy.  She presented to the ED due to persistent and worsening symptoms. Neurology was consulted, LP with protein of only 33, no cytoalbumin neurological dissociation and she was started on IVIG due to persistent sensory neuropathy most likely secondary to Enberl.  Subjective: Patient was feeling little better when seen today.  Her tingling and numbness seems improving.  Continue to feel weak when try to work with PT.  Had 1 large bowel movement yesterday.  Assessment & Plan:   Principal Problem:   Acute motor and sensory axonal neuropathy Active Problems:   Hypertension   OSA (obstructive sleep apnea)   GERD (gastroesophageal reflux disease)   Numbness and tingling of lower extremity   Numbness and tingling of both feet   Ambulatory dysfunction  Acute motor and sensory neuropathy, in the setting of Enblrel use. Lumbar puncture with 33 protein. Patient continue to have significant ambulatory dysfunction, this am was not able to stand by herself and required significant assistance. B12 323.  Repeat CT neck without any acute abnormalities. Chest x-ray with bilateral basal atelectasis.  Patient is saturating well on room air. Denies any shortness of breath. Due to worsening tingling and numbness neurology decided to place her on IVIG X 5 days-Day 3 today.  Patient is tolerating IVIG well. -PT/OT evaluation are recommending SNF  placement. -Continue to monitor.  Constipation/abdominal distention.  KUB with right colonic stool burden. No BM yesterday with magnesium citrate. 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  She had 1 large bowel movement yesterday. -Continue  bowel regimen.  Hypertension. Pressure mildly elevated. -Continue home valsartan.  Psoriasis.  Patient was treated with humara for 2 years with no response and recently started on Enbrel, most likely the cause of her current symptoms. -You should not get that medication again. -Follow-up with rheumatology as an outpatient.  GERD. -Continue home antacid.  Hyponatremia. Really thought to be due to poor p.o. intake as it shows some improvement with IV hydration initially and then started getting worse. Discussed with pharmacy and according to him IVIG can cause pseudohyponatremia.  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 She was advised for surgery which has not been done yet. 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 We will discontinue IV fluid after discussing with neurology.  We will encourage patient for p.o. hydration.  If there is no change in her sodium level which was 124 today or she requires IV hydration we can add daily salt tablets to maintain her sodium level. -Give her one-time dose of 1 g of salt tablet. -Continue to monitor.  Objective: Vitals:   03/31/20 0132 03/31/20 0134 03/31/20 0205 03/31/20 0802  BP: (!) 157/78 (!) 149/88 (!) 158/83 132/72  Pulse: 72 80 88 77  Resp: 16 16 16 17   Temp: 98.1 F (36.7 C) 98.1 F (36.7 C) 97.8 F (36.6 C) (!) 97.5 F (36.4 C)  TempSrc: Oral  Oral Oral  SpO2: 96% 95% 94% 95%  Weight:      Height:        Intake/Output Summary (Last 24 hours) at 03/31/2020 1415 Last data filed at 03/31/2020 1030 Gross per 24 hour  Intake 720 ml  Output --  Net 720 ml   Filed Weights   03/27/20 1024  Weight: 76.2 kg     Examination:  General exam: Appears calm and comfortable. Sitting in chair. Respiratory system: Clear bilaterally, respiratory effort normal. Cardiovascular system: S1 & S2 heard, RRR. No JVD, murmurs,or rubs, Gastrointestinal system: Soft, nontender, mildly distended, bowel sounds positive. Central nervous system: Alert and oriented, sensations and strength seems improving in all extremities. Extremities: No edema, no cyanosis, pulses intact and symmetrical. Skin: No rashes, lesions or ulcers Psychiatry: Judgement and insight appear normal.   DVT prophylaxis: Lovenox Code Status: Full Family Communication: No family at bedside, discussed with patient today. Disposition Plan:  Status is: Inpatient  Remains inpatient appropriate because:Inpatient level of care appropriate due to severity of illness   Dispo: The patient is from: Home              Anticipated d/c is to: SNF              Anticipated d/c date is: 3 days              Patient currently is not medically stable to d/c.  Patient was started on IVIG for 5 days , day 3 today.  Consultants:   Neurology  Procedures:  LP  Antimicrobials:   Data Reviewed: I have personally reviewed following labs and imaging studies  CBC: Recent Labs  Lab 03/25/20 0950 03/27/20 1029 03/28/20 0548 03/30/20 0324 03/31/20 0415  WBC 5.4 5.7 5.5 6.5 6.1  NEUTROABS 3.8  --   --   --   --   HGB 13.5 12.8 13.7 13.1 12.6  HCT 40.8 38.3 39.8 38.4 37.7  MCV 92.1 90.1 88.2 89.5 90.0  PLT 228 226 223 215 621   Basic Metabolic Panel: Recent Labs  Lab 03/27/20 1029 03/28/20 0548 03/29/20 0527 03/30/20 0324 03/31/20 0415  NA 130* 131* 133* 129* 124*  K 4.0 3.9 3.9 3.8 3.8  CL 99 98 101 98 94*  CO2 22 24 26 27 25   GLUCOSE 102* 97 105* 108* 109*  BUN 9 10 12 12 11   CREATININE 0.47 0.50 0.57 0.44 0.55  CALCIUM 8.9 9.0 8.7* 8.1* 7.9*   GFR: Estimated Creatinine Clearance: 58.5 mL/min (by C-G formula based on SCr of 0.55  mg/dL). Liver Function Tests: Recent Labs  Lab 03/24/20 1438 03/25/20 1023  AST 15 16  ALT 13 15  ALKPHOS 87 78  BILITOT 0.6 1.0  PROT 7.0 7.2  ALBUMIN 4.1 3.7   Recent Labs  Lab 03/25/20 1023  LIPASE 26   No results for input(s): AMMONIA in the last 168 hours. Coagulation Profile: No results for input(s): INR, PROTIME in the last 168 hours. Cardiac Enzymes: No results for input(s): CKTOTAL, CKMB, CKMBINDEX, TROPONINI in the last 168 hours. BNP (last 3 results) No results for input(s): PROBNP in the last 8760 hours. HbA1C: No results for input(s): HGBA1C in the last 72 hours. CBG: No results for input(s): GLUCAP in the last 168 hours. Lipid Profile: No results for input(s): CHOL, HDL, LDLCALC, TRIG, CHOLHDL, LDLDIRECT in the last 72 hours. Thyroid Function Tests: No results for input(s): TSH, T4TOTAL, FREET4, T3FREE, THYROIDAB in the last 72 hours. Anemia Panel: No results for input(s):  VITAMINB12, FOLATE, FERRITIN, TIBC, IRON, RETICCTPCT in the last 72 hours. Sepsis Labs: No results for input(s): PROCALCITON, LATICACIDVEN in the last 168 hours.  Recent Results (from the past 240 hour(s))  SARS Coronavirus 2 by RT PCR (hospital order, performed in [facility] hospital lab) Nasopharyngeal Nasopharyngeal Swab     Status: None   Collection Time: 03/27/20  1:55 PM   Specimen: Nasopharyngeal Swab  Result Value Ref Range Status   SARS Coronavirus 2 NEGATIVE NEGATIVE Final    Comment: (NOTE) SARS-CoV-2 target nucleic acids are NOT DETECTED.  The SARS-CoV-2 RNA is generally detectable in upper and lower respiratory specimens during the acute phase of infection. The lowest concentration of SARS-CoV-2 viral copies this assay can detect is 250 copies / mL. A negative result does not preclude SARS-CoV-2 infection and should not be used as the sole basis for treatment or other patient management decisions.  A negative result may occur with improper specimen collection /  handling, submission of specimen other than nasopharyngeal swab, presence of viral mutation(s) within the areas targeted by this assay, and inadequate number of viral copies (<250 copies / mL). A negative result must be combined with clinical observations, patient history, and epidemiological information.  Fact Sheet for Patients:   StrictlyIdeas.no  Fact Sheet for Healthcare Providers: BankingDealers.co.za  This test is not yet approved or  cleared by the [place] FDA and has been authorized for detection and/or diagnosis of SARS-CoV-2 by FDA under an Emergency Use Authorization (EUA).  This EUA will remain in effect (meaning this test can be used) for the duration of the COVID-19 declaration under Section 564(b)(1) of the Act, 21 U.S.C. section 360bbb-3(b)(1), unless the authorization is terminated or revoked sooner.  Performed at [facility], [address], [place],  [address]      Radiology Studies: No results found.  Scheduled Meds: . cholecalciferol  2,000 Units Oral Daily  . clobetasol cream  1 application Topical Q Sun  . enoxaparin (LOVENOX) injection  40 mg Subcutaneous Q24H  . famotidine  20 mg Oral BID  . irbesartan  75 mg Oral Daily  . magnesium citrate  1 Bottle Oral Once  . psyllium  1 packet Oral Daily  . sodium chloride flush  3 mL Intravenous Q12H   Continuous Infusions: . Immune Globulin 10% 44 mL/hr at 03/30/20 2357     LOS: 3 days   Time spent: 40 minutes.  Lorella Nimrod, MD [facility]  If 7PM-7AM, please contact night-coverage [email or web address]  03/31/2020, 2:15 PM   This record has been created using Systems analyst. Errors have been sought and corrected,but may not always be located. Such creation errors do not reflect on the standard of care.

## 2020-03-31 NOTE — Progress Notes (Signed)
Subjective: Patient reports no worsening overnight.  Feels that her legs may be getting some better.    Objective: Current vital signs: BP 132/72 (BP Location: Right Arm)   Pulse 77   Temp (!) 97.5 F (36.4 C) (Oral)   Resp 17   Ht 5\' 6"  (1.676 m)   Wt 76.2 kg   SpO2 95%   BMI 27.12 kg/m  Vital signs in last 24 hours: Temp:  [97.5 F (36.4 C)-98.2 F (36.8 C)] 97.5 F (36.4 C) (06/30 0802) Pulse Rate:  [72-88] 77 (06/30 0802) Resp:  [16-19] 17 (06/30 0802) BP: (119-166)/(72-88) 132/72 (06/30 0802) SpO2:  [93 %-98 %] 95 % (06/30 0802)  Intake/Output from previous day: 06/29 0701 - 06/30 0700 In: 840 [P.O.:840] Out: -  Intake/Output this shift: Total I/O In: 240 [P.O.:240] Out: -  Nutritional status:  Diet Order            Diet 2 gram sodium Room service appropriate? Yes; Fluid consistency: Thin  Diet effective now                 Neurologic Exam: Mental Status: Alert, oriented, thought content appropriate. Speech fluent without evidence of aphasia. Able to follow 3 step commands without difficulty. Cranial Nerves: II: Visual fields grossly normal, pupils equal, round, reactive to light and accommodation III,IV, VI: ptosis not present, extra-ocular motions intact bilaterally V,VII: smile symmetricbut opens mouth very little for conversation, facial light touch sensation normal bilaterally VIII: hearing normal bilaterally IX,X: gag reflex present XI: bilateral shoulder shrug XII: midline tongue extension Motor: Unable to lift arms or maintain against gravity at the shoulders. Elbow flexion and extension as well as hand grip at 4/5. Able to lift both lower extremity about 6 inches off the bed. 5/5 ankle flexion and extension noted. Sensory: Pinprick and light touchdecreased in the lower extremities to include the abdomen and buttocks. Also decreased in the hands bilaterally Deep Tendon Reflexes:1+ in the upper extremities and absent in the lower  extremities. Plantars: Right:muteLeft: mute  Lab Results: Basic Metabolic Panel: Recent Labs  Lab 03/27/20 1029 03/27/20 1029 03/28/20 0548 03/28/20 0548 03/29/20 0527 03/30/20 0324 03/31/20 0415  NA 130*  --  131*  --  133* 129* 124*  K 4.0  --  3.9  --  3.9 3.8 3.8  CL 99  --  98  --  101 98 94*  CO2 22  --  24  --  26 27 25   GLUCOSE 102*  --  97  --  105* 108* 109*  BUN 9  --  10  --  12 12 11   CREATININE 0.47  --  0.50  --  0.57 0.44 0.55  CALCIUM 8.9   < > 9.0   < > 8.7* 8.1* 7.9*   < > = values in this interval not displayed.    Liver Function Tests: Recent Labs  Lab 03/24/20 1438 03/25/20 1023  AST 15 16  ALT 13 15  ALKPHOS 87 78  BILITOT 0.6 1.0  PROT 7.0 7.2  ALBUMIN 4.1 3.7   Recent Labs  Lab 03/25/20 1023  LIPASE 26   No results for input(s): AMMONIA in the last 168 hours.  CBC: Recent Labs  Lab 03/25/20 0950 03/27/20 1029 03/28/20 0548 03/30/20 0324 03/31/20 0415  WBC 5.4 5.7 5.5 6.5 6.1  NEUTROABS 3.8  --   --   --   --   HGB 13.5 12.8 13.7 13.1 12.6  HCT 40.8 38.3 39.8 38.4  37.7  MCV 92.1 90.1 88.2 89.5 90.0  PLT 228 226 223 215 215    Cardiac Enzymes: No results for input(s): CKTOTAL, CKMB, CKMBINDEX, TROPONINI in the last 168 hours.  Lipid Panel: No results for input(s): CHOL, TRIG, HDL, CHOLHDL, VLDL, LDLCALC in the last 168 hours.  CBG: No results for input(s): GLUCAP in the last 168 hours.  Microbiology: Results for orders placed or performed during the hospital encounter of 03/27/20  SARS Coronavirus 2 by RT PCR (hospital order, performed in [facility] hospital lab) Nasopharyngeal Nasopharyngeal Swab     Status: None   Collection Time: 03/27/20  1:55 PM   Specimen: Nasopharyngeal Swab  Result Value Ref Range Status   SARS Coronavirus 2 NEGATIVE NEGATIVE Final    Comment: (NOTE) SARS-CoV-2 target nucleic acids are NOT DETECTED.  The SARS-CoV-2 RNA is generally detectable in upper and  lower respiratory specimens during the acute phase of infection. The lowest concentration of SARS-CoV-2 viral copies this assay can detect is 250 copies / mL. A negative result does not preclude SARS-CoV-2 infection and should not be used as the sole basis for treatment or other patient management decisions.  A negative result may occur with improper specimen collection / handling, submission of specimen other than nasopharyngeal swab, presence of viral mutation(s) within the areas targeted by this assay, and inadequate number of viral copies (<250 copies / mL). A negative result must be combined with clinical observations, patient history, and epidemiological information.  Fact Sheet for Patients:   StrictlyIdeas.no  Fact Sheet for Healthcare Providers: BankingDealers.co.za  This test is not yet approved or  cleared by the [place] FDA and has been authorized for detection and/or diagnosis of SARS-CoV-2 by FDA under an Emergency Use Authorization (EUA).  This EUA will remain in effect (meaning this test can be used) for the duration of the COVID-19 declaration under Section 564(b)(1) of the Act, 21 U.S.C. section 360bbb-3(b)(1), unless the authorization is terminated or revoked sooner.  Performed at [facility], [address] [place], [place] [address]     Coagulation Studies: No results for input(s): LABPROT, INR in the last 72 hours.  Imaging: CT C-SPINE NO CHARGE  Result Date: 03/29/2020 CLINICAL DATA:  Worsening numbness and tingling.  Neck pain. EXAM: CT CERVICAL SPINE WITHOUT CONTRAST TECHNIQUE: Multidetector CT imaging of the cervical spine was performed without intravenous contrast. Multiplanar CT image reconstructions were also generated. COMPARISON:  CT angio head and neck 03/27/2020 FINDINGS: Request was made for cervical spine reconstruction from the CT angio head and neck data performed on 03/27/2020.  Alignment: Normal.  Mild scoliosis. Skull base and vertebrae: Negative for fracture. No focal skeletal lesion identified. Soft tissues and spinal canal: Extensive venous reflux into the paravertebral veins extending into the vertebral bodies. 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 Left arm contrast injection. No soft tissue mass or edema.  Vascular findings per CTA report Disc levels:  C2-3: Negative C3-4: Mild disc degeneration and spurring without significant spinal or foraminal stenosis. C4-5: Mild disc degeneration and uncinate spurring bilaterally without significant stenosis. C5-6: Negative C6-7: Mild disc degeneration and mild spurring. No significant stenosis C7-T1: Negative Upper chest: Mild ground-glass density in the right upper lobe could represent acute or chronic airspace disease. No effusion. Other: None IMPRESSION: No acute abnormality in the cervical spine.  No fracture or mass Mild degenerative changes cervical spine without spinal or foraminal encroachment. 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 Electronically Signed   By: Franchot Gallo M.D.   On: 03/29/2020 12:52    Medications:  I have reviewed the patient's current medications. Scheduled: . cholecalciferol  2,000 Units Oral Daily  . clobetasol cream  1 application Topical Q Sun  . enoxaparin (LOVENOX) injection  40 mg Subcutaneous Q24H  . famotidine  20 mg Oral BID  . irbesartan  75 mg Oral Daily  . magnesium citrate  1 Bottle Oral Once  . psyllium  1 packet Oral Daily  . sodium chloride flush  3 mL Intravenous Q12H    Assessment/Plan: 79 y.o. female with a past medical history of CAD, pacemaker, gastric reflux, hypertension, hyperlipidemia, psoriasis, arthritis, presents to the emergency department for worsening numbness and tingling. According to the patient she got her Enbrel injection approximately 1 week ago with total 2 doses  first one being beginning of June. She states since she has been experiencing tingling in her feet that has been progressing up her body that started last Wednesday. Patient states initially she saw her doctor who diagnosed her with neuropathy however the numbness and tingling sensation continued to progress up her legs and into her upper extremities as well. Weakness has progressed. Reports she is stable today.  No SOB noted on examination today.  NIF and VC have been stable. Now using CPAP. CT of the cervical spine personally reviewed and shows no etiology for her symptoms    Recommendations:  1. Continued NIF and VC q 12 hours  2. Agree with continued measures to alleviate constipation 3. Telemetry 4. Continue IVIg at 400mg /kg/day.  Today is day 3 of 5   LOS: 3 days   Alexis Goodell, MD Neurology [phone] 03/31/2020  11:33 AM

## 2020-03-31 NOTE — Progress Notes (Signed)
Occupational Therapy Treatment Patient Details Name: Alison Huffman MRN: [id number] DOB: 12/03/1940 Today's Date: 03/31/2020    History of present illness Alison Huffman is an 79 y/o F with PMH: CAD, PPM, gastric reflux, hypertension, hyperlipidemia, psoriasis, arthritis, who presented to the [facility] ED on 6/24 d/t worsening numbness and tingling.  According to the patient she got her Enbrel injection approximately 1 week ago with total 2 doses first one being beginning of June (reports she was previously on Humira). She states since she has been experiencing tingling in her feet that has been progressing up her body that started last Wednesday. Her PCP initially dx with neuropathy, however the numbness and tingling continued to progress up her LEs and into her UEs as well. 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 LP was unremarkable.   OT comments  Pt seen for OT treatment this date to f/u re: safety with ADLs/ADL mobility. Pt with some increased weakness this date. Pt with difficulty self feeding and OT adjusts positioning to optimize efficiency and independence. Pt requires MOD A to come to EOB sitting, MIN A to wash face and apply moisturizer (to reach forehead d/t limited shoulder ROM). In addition, pt requires MOD A for combing hair (once supported sitting in chair). OT attempts to stand pt from elevated bed surface with knee blocking and MAX A arm in arm assist and unsuccessful x2 tries. OT then requests CNA assist with sit-to-stand lift to complete transfer to chair to improve pt's posture in sitting and access to lunch-time meal with optimized positioning. OT educates pt re: [facility] exercises. SNF continues to be most reasonable d/c at this time. Will continue to follow with occupational therapy services.     Follow Up Recommendations  SNF    Equipment Recommendations  Other (comment) (defer to next level of care)    Recommendations for Other Services       Precautions / Restrictions Precautions Precautions: Fall Precaution Comments: Lt knee buckling issues Restrictions Weight Bearing Restrictions: No       Mobility Bed Mobility Overal bed mobility: Needs Assistance Bed Mobility: Supine to Sit     Supine to sit: Mod assist;HOB elevated        Transfers Overall transfer level: Needs assistance Equipment used:  Collene Mares) Transfers: Sit to/from Stand Sit to Stand: From elevated surface;Total assist;+2 safety/equipment Stand pivot transfers: Total assist;+2 safety/equipment;From elevated surface       General transfer comment: 2 attempts to perform sit to stand from elevated surface with knee blocking, but unsuccessful coming to stand this date. OT ultimately assists CNA in getting pt to chair with standing lift with MIN cues for pt comfort/sequnce/positioning.    Balance Overall balance assessment: Needs assistance   Sitting balance-Leahy Scale: Fair Sitting balance - Comments: loses balance L laterally twice from static sitting, appears to be d/t fatigue. Postural control: Left lateral lean Standing balance support: Bilateral upper extremity supported Standing balance-Leahy Scale: Zero Standing balance comment: requires TOTAL A and lift to sustain stand this date, pt appears less able to contribute to fxl transfer at this time versus eval on Monday.                           ADL either performed or assessed with clinical judgement   ADL Overall ADL's : Needs assistance/impaired Eating/Feeding: Moderate assistance;Bed level Eating/Feeding Details (indicate cue type and reason): HOB elevated, 30-40% spillage of breakfast Grooming: Wash/dry face;Minimal  assistance;Brushing hair;Moderate assistance Grooming Details (indicate cue type and reason): Pt requires MIN A with EOB sitting balance this date, and increased assist to wash face with static sitting d/t requiring UEs to sustain static sit. OT elevates bilateral  elbows so pt can comb hair, pt requires MOD A with this task d/t limited shoulder ROM-unable to reach posterior or posterolateral portion of head.         Upper Body Dressing : Moderate assistance;Bed level Upper Body Dressing Details (indicate cue type and reason): HOB elevated to doff soiled gown and don clean one d/t limited ability to perform shoudler flexion against gravity. Lower Body Dressing: Maximal assistance;Bed level Lower Body Dressing Details (indicate cue type and reason): to don socks using corss leg technique                     Vision Patient Visual Report: No change from baseline     Perception     Praxis      Cognition Arousal/Alertness: Awake/alert Behavior During Therapy: WFL for tasks assessed/performed Overall Cognitive Status: Within Functional Limits for tasks assessed                                          Exercises Other Exercises Other Exercises: OT facilitates education with pt and RN re: propping elbows on pillows bilaterally and placing table at same height of elbows to improve pt ability to reach needed items including food items. Both parties with good understanding. Other Exercises: OT facilitates educaiton with pt re: [facility] exercises and provides handout and demonstration. Pt requires continued follow up to foster independence with carryover of [facility] exercise.   Shoulder Instructions       General Comments      Pertinent Vitals/ Pain       Pain Assessment: No/denies pain (vague discomfort in back, wants to sit up in chair for more support.)  Home Living                                          Prior Functioning/Environment              Frequency           Progress Toward Goals  OT Goals(current goals can now be found in the care plan section)  Progress towards OT goals: Not progressing toward goals - comment (pt with some increased fatigue this date which may be impacting  performance, will continue to assess.)  Acute Rehab OT Goals Patient Stated Goal: regain independent mobility OT Goal Formulation: With patient Time For Goal Achievement: 04/12/20 Potential to Achieve Goals: Good  Plan Discharge plan remains appropriate    Co-evaluation                 AM-PAC OT "6 Clicks" Daily Activity     Outcome Measure   Help from another person eating meals?: A Little Help from another person taking care of personal grooming?: A Lot Help from another person toileting, which includes using toliet, bedpan, or urinal?: A Lot Help from another person bathing (including washing, rinsing, drying)?: A Lot Help from another person to put on and taking off regular upper body clothing?: A Lot Help from another person to put on and taking off regular lower body clothing?:  A Lot 6 Click Score: 13    End of Session Equipment Utilized During Treatment: Other (comment) (sit to stand lift)  OT Visit Diagnosis: Unsteadiness on feet (R26.81);Other abnormalities of gait and mobility (R26.89);Muscle weakness (generalized) (M62.81);Other symptoms and signs involving the nervous system (R29.898)   Activity Tolerance     Patient Left in bed;with call bell/phone within reach;with bed alarm set   Nurse Communication Mobility status;Need for lift equipment        Time: 6440-3474 OT Time Calculation (min): 66 min  Charges: OT General Charges $OT Visit: 1 Visit OT Treatments $Self Care/Home Management : 23-37 mins $Therapeutic Activity: 8-22 mins $Therapeutic Exercise: 8-22 mins  Gerrianne Scale, MS, OTR/L ascom [phone] 03/31/20, 1:12 PM

## 2020-04-01 LAB — CBC
HCT: 35.9 % — ABNORMAL LOW (ref 36.0–46.0)
Hemoglobin: 12.3 g/dL (ref 12.0–15.0)
MCH: 30.4 pg (ref 26.0–34.0)
MCHC: 34.3 g/dL (ref 30.0–36.0)
MCV: 88.6 fL (ref 80.0–100.0)
Platelets: 209 10*3/uL (ref 150–400)
RBC: 4.05 MIL/uL (ref 3.87–5.11)
RDW: 12.5 % (ref 11.5–15.5)
WBC: 5.3 10*3/uL (ref 4.0–10.5)
nRBC: 0 % (ref 0.0–0.2)

## 2020-04-01 LAB — BASIC METABOLIC PANEL
Anion gap: 5 (ref 5–15)
BUN: 13 mg/dL (ref 8–23)
CO2: 25 mmol/L (ref 22–32)
Calcium: 8.1 mg/dL — ABNORMAL LOW (ref 8.9–10.3)
Chloride: 95 mmol/L — ABNORMAL LOW (ref 98–111)
Creatinine, Ser: 0.47 mg/dL (ref 0.44–1.00)
GFR calc Af Amer: >60 mL/min (ref >60)
GFR calc non Af Amer: >60 mL/min (ref >60)
Glucose, Bld: 102 mg/dL — ABNORMAL HIGH (ref 70–99)
Potassium: 3.7 mmol/L (ref 3.5–5.1)
Sodium: 125 mmol/L — ABNORMAL LOW (ref 135–145)

## 2020-04-01 MED ORDER — DEXTROSE 5 % IV SOLN
50.0000 mL | Freq: Once | INTRAVENOUS | Status: DC | PRN
  Filled 2020-04-01 (×2): qty 50

## 2020-04-01 MED ORDER — DEXTROSE 5 % IV SOLN
50.0000 mL | Freq: Once | INTRAVENOUS | Status: DC | PRN
  Filled 2020-04-01 (×3): qty 50

## 2020-04-01 MED ORDER — POLYETHYLENE GLYCOL 3350 17 G PO PACK
17.0000 g | PACK | Freq: Every day | ORAL | Status: DC | PRN
  Administered 2020-04-02: 17 g via ORAL
  Filled 2020-04-01: qty 1

## 2020-04-01 MED ORDER — MAGNESIUM CITRATE PO SOLN
1.0000 | Freq: Once | ORAL | Status: DC
  Filled 2020-04-01: qty 296

## 2020-04-01 NOTE — Progress Notes (Addendum)
PROGRESS NOTE    Alison Huffman  MBW:[id number]  DOB: Jul 26, 1941  DOA: 03/27/2020 PCP: Leone Haven, MD Outpatient Specialists:   Hospital course:  79 year old female with CAD, GERD, HTN, HL, psoriasis was admitted 03/27/2020 with a sending neuropathy and paralysis thought to be secondary to Enbrel which was started 1 week prior to her symptoms.  Patient has been followed by neurology who is started IVIG.   Subjective:  Patient is not sure if she is better but she does note that "that boot-like feeling in my feet is not there anymore".  She continues to be weak in her lower and upper extremities.  Patient denies any shortness of breath or difficulty taking a deep breath in, but does state that she is getting congested is having a hard time coughing.  She does feel like she is getting a little strangled when she is trying to cough.  She is afraid that she is not going to get any better and was wondering how long it would take for her to feel better.   Objective: Vitals:   03/31/20 2315 03/31/20 2352 04/01/20 0755 04/01/20 1604  BP: (!) 141/82 123/88 136/68 (!) 149/78  Pulse: 73 72 67 73  Resp: 16 16 16 18   Temp: 97.7 F (36.5 C) 98.6 F (37 C) 98.1 F (36.7 C) 98.3 F (36.8 C)  TempSrc: Oral Oral Oral Oral  SpO2: 96% 95% 93% 95%  Weight:      Height:        Intake/Output Summary (Last 24 hours) at 04/01/2020 1753 Last data filed at 04/01/2020 1010 Gross per 24 hour  Intake 480 ml  Output --  Net 480 ml   Filed Weights   03/27/20 1024  Weight: 76.2 kg     Exam:  General: Thin female looking much younger than stated age sitting up in bed speaking with a somewhat close jaw and somewhat breathy voice.  Patient with intermittent coughing while speaking, difficulty with expectoration. Eyes: sclera anicteric, conjuctiva mild injection bilaterally CVS: S1-S2, regular  Respiratory:  decreased air entry bilaterally secondary to decreased inspiratory effort, rales at  bases  GI: NABS, soft, NT  LE: No edema.  Neuro: A/O x 3, detailed neuro exam per neurology note below.   Psych: patient is logical and coherent, judgement and insight appear normal, mood and affect appropriate to situation.   Assessment & Plan:    Acute a sending motor and sensory neuropathy Thought to be secondary to Enbrel Patient has been started on IVIG per neurology, very much appreciate neurology input. She believe her symptoms of numbness have halted but she is not sure that she is any stronger. Patient denies shortness of breath but she did appear to have a breathy voice and was somewhat short of breath to my evaluation.NIF was done today which was initially 18 and then 22 on repeat this afternoon. Patient's O2 sats are within normal limits on room air. Patient was seen by PT who noted that she was unable to stand on her own and recommended mechanical lift for transfers at present. We will need to follow very closely.  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 Will need to add MiraLAX daily with possible initiation of enemas as needed.  Cough Will ask respiratory therapy to try chest PT on her to see if they can help get her secretions up.  Hypertension.  Continue home doses of valsartan.  Psoriasis.   Patient was treated with humara for 2 years  with no response No flare since discontinuation of Enbrel. Further treatment per outpatient dermatology/rheumatology.  Hyponatremia Unclear if patient actually has hyponatremia versus pseudohyponatremia secondary to IVIG. Indeed it has gotten worse since initiation of IVIG. At present she is asymptomatic. We will see how she does when she completes treatment. Can send urine studies at that time if warranted.  Left ovarian cyst Per chart review patient has complex ovarian cyst on left possibly malignant with increased Roma score. Patient is aware and was advised to go for surgery, is  considering this.  DVT prophylaxis: Lovenox Code Status: Full Family Communication: Patient said no need to contact her niece at present Disposition Plan:   Patient is from: Home  Anticipated Discharge Location: SNF  Barriers to Discharge: Ongoing IVIG, last dose tomorrow  Is patient medically stable for Discharge: Not yet   Consultants:  Neurology  Procedures:  Lumbar puncture  Antimicrobials:  None, on IVIG   Data Reviewed:  Basic Metabolic Panel: Recent Labs  Lab 03/28/20 0548 03/29/20 0527 03/30/20 0324 03/31/20 0415 04/01/20 0443  NA 131* 133* 129* 124* 125*  K 3.9 3.9 3.8 3.8 3.7  CL 98 101 98 94* 95*  CO2 24 26 27 25 25   GLUCOSE 97 105* 108* 109* 102*  BUN 10 12 12 11 13   CREATININE 0.50 0.57 0.44 0.55 0.47  CALCIUM 9.0 8.7* 8.1* 7.9* 8.1*   Liver Function Tests: No results for input(s): AST, ALT, ALKPHOS, BILITOT, PROT, ALBUMIN in the last 168 hours. No results for input(s): LIPASE, AMYLASE in the last 168 hours. No results for input(s): AMMONIA in the last 168 hours. CBC: Recent Labs  Lab 03/27/20 1029 03/28/20 0548 03/30/20 0324 03/31/20 0415 04/01/20 0443  WBC 5.7 5.5 6.5 6.1 5.3  HGB 12.8 13.7 13.1 12.6 12.3  HCT 38.3 39.8 38.4 37.7 35.9*  MCV 90.1 88.2 89.5 90.0 88.6  PLT 226 223 215 215 209   Cardiac Enzymes: No results for input(s): CKTOTAL, CKMB, CKMBINDEX, TROPONINI in the last 168 hours. BNP (last 3 results) No results for input(s): PROBNP in the last 8760 hours. CBG: No results for input(s): GLUCAP in the last 168 hours.  Recent Results (from the past 240 hour(s))  SARS Coronavirus 2 by RT PCR (hospital order, performed in [facility] hospital lab) Nasopharyngeal Nasopharyngeal Swab     Status: None   Collection Time: 03/27/20  1:55 PM   Specimen: Nasopharyngeal Swab  Result Value Ref Range Status   SARS Coronavirus 2 NEGATIVE NEGATIVE Final    Comment: (NOTE) SARS-CoV-2 target nucleic acids are NOT DETECTED.  The  SARS-CoV-2 RNA is generally detectable in upper and lower respiratory specimens during the acute phase of infection. The lowest concentration of SARS-CoV-2 viral copies this assay can detect is 250 copies / mL. A negative result does not preclude SARS-CoV-2 infection and should not be used as the sole basis for treatment or other patient management decisions.  A negative result may occur with improper specimen collection / handling, submission of specimen other than nasopharyngeal swab, presence of viral mutation(s) within the areas targeted by this assay, and inadequate number of viral copies (<250 copies / mL). A negative result must be combined with clinical observations, patient history, and epidemiological information.  Fact Sheet for Patients:   StrictlyIdeas.no  Fact Sheet for Healthcare Providers: BankingDealers.co.za  This test is not yet approved or  cleared by the [place] FDA and has been authorized for detection and/or diagnosis of SARS-CoV-2 by FDA under an  Emergency Use Authorization (EUA).  This EUA will remain in effect (meaning this test can be used) for the duration of the COVID-19 declaration under Section 564(b)(1) of the Act, 21 U.S.C. section 360bbb-3(b)(1), unless the authorization is terminated or revoked sooner.  Performed at [facility], [address], [place], [place] [address]       Studies: No results found.   Scheduled Meds: . cholecalciferol  2,000 Units Oral Daily  . clobetasol cream  1 application Topical Q Sun  . enoxaparin (LOVENOX) injection  40 mg Subcutaneous Q24H  . famotidine  20 mg Oral BID  . irbesartan  75 mg Oral Daily  . psyllium  1 packet Oral Daily  . sodium chloride flush  3 mL Intravenous Q12H   Continuous Infusions: . Immune Globulin 10% 30 g (03/31/20 2102)    Principal Problem:   Acute motor and sensory axonal neuropathy Active Problems:    Hypertension   OSA (obstructive sleep apnea)   GERD (gastroesophageal reflux disease)   Numbness and tingling of lower extremity   Numbness and tingling of both feet   Ambulatory dysfunction     Alison Huffman Derek Jack, [facility]  If 7PM-7AM, please contact night-coverage [email or web address] Password [email or web address] 04/01/2020, 5:53 PM    LOS: 4 days

## 2020-04-01 NOTE — Progress Notes (Signed)
NIF -22

## 2020-04-01 NOTE — Progress Notes (Signed)
Subjective: Reports that she feels her legs are dong better but continues to have upper extremity strength issues.  FVC has remained stable.  Using CPAP at night.  Objective: Current vital signs: BP 136/68 (BP Location: Right Arm)   Pulse 67   Temp 98.1 F (36.7 C) (Oral)   Resp 16   Ht 5\' 6"  (1.676 m)   Wt 76.2 kg   SpO2 93%   BMI 27.12 kg/m  Vital signs in last 24 hours: Temp:  [97.6 F (36.4 C)-98.6 F (37 C)] 98.1 F (36.7 C) (07/01 0755) Pulse Rate:  [63-78] 67 (07/01 0755) Resp:  [16-18] 16 (07/01 0755) BP: (122-143)/(66-88) 136/68 (07/01 0755) SpO2:  [93 %-96 %] 93 % (07/01 0755)  Intake/Output from previous day: 06/30 0701 - 07/01 0700 In: 720 [P.O.:720] Out: -  Intake/Output this shift: Total I/O In: 240 [P.O.:240] Out: -  Nutritional status:  Diet Order            Diet 2 gram sodium Room service appropriate? Yes; Fluid consistency: Thin  Diet effective now                 Neurologic Exam: Mental Status: Alert, oriented, thought content appropriate. Speech fluent without evidence of aphasia. Able to follow 3 step commands without difficulty. Cranial Nerves: II: Visual fields grossly normal, pupils equal, round, reactive to light and accommodation III,IV, VI: ptosis not present, extra-ocular motions intact bilaterally V,VII: smile symmetricbut opens mouth very little for conversation, facial light touch sensation normal bilaterally VIII: hearing normal bilaterally IX,X: gag reflex present XI: bilateral shoulder shrug XII: midline tongue extension Motor: Unable to lift arms or maintain against gravity at the shoulders but is able to extend arms out on lap. Elbow flexion and extension as well as hand grip at 4/5. Able to lift both lower extremityabout 6inches off the bed. 5/5 ankle flexion and extension noted. Sensory: Pinprick and light touchdecreased in the lower extremities to include the abdomen and buttocks. Also decreased in the hands  bilaterally Deep Tendon Reflexes:1+ in the upper extremities and absent in the lower extremities. Plantars: Right:muteLeft: mute  Lab Results: Basic Metabolic Panel: Recent Labs  Lab 03/28/20 0548 03/28/20 0548 03/29/20 0527 03/29/20 0527 03/30/20 0324 03/31/20 0415 04/01/20 0443  NA 131*  --  133*  --  129* 124* 125*  K 3.9  --  3.9  --  3.8 3.8 3.7  CL 98  --  101  --  98 94* 95*  CO2 24  --  26  --  27 25 25   GLUCOSE 97  --  105*  --  108* 109* 102*  BUN 10  --  12  --  12 11 13   CREATININE 0.50  --  0.57  --  0.44 0.55 0.47  CALCIUM 9.0   < > 8.7*   < > 8.1* 7.9* 8.1*   < > = values in this interval not displayed.    Liver Function Tests: No results for input(s): AST, ALT, ALKPHOS, BILITOT, PROT, ALBUMIN in the last 168 hours. No results for input(s): LIPASE, AMYLASE in the last 168 hours. No results for input(s): AMMONIA in the last 168 hours.  CBC: Recent Labs  Lab 03/27/20 1029 03/28/20 0548 03/30/20 0324 03/31/20 0415 04/01/20 0443  WBC 5.7 5.5 6.5 6.1 5.3  HGB 12.8 13.7 13.1 12.6 12.3  HCT 38.3 39.8 38.4 37.7 35.9*  MCV 90.1 88.2 89.5 90.0 88.6  PLT 226 223 215 215 209  Cardiac Enzymes: No results for input(s): CKTOTAL, CKMB, CKMBINDEX, TROPONINI in the last 168 hours.  Lipid Panel: No results for input(s): CHOL, TRIG, HDL, CHOLHDL, VLDL, LDLCALC in the last 168 hours.  CBG: No results for input(s): GLUCAP in the last 168 hours.  Microbiology: Results for orders placed or performed during the hospital encounter of 03/27/20  SARS Coronavirus 2 by RT PCR (hospital order, performed in [facility] hospital lab) Nasopharyngeal Nasopharyngeal Swab     Status: None   Collection Time: 03/27/20  1:55 PM   Specimen: Nasopharyngeal Swab  Result Value Ref Range Status   SARS Coronavirus 2 NEGATIVE NEGATIVE Final    Comment: (NOTE) SARS-CoV-2 target nucleic acids are NOT DETECTED.  The SARS-CoV-2 RNA is generally  detectable in upper and lower respiratory specimens during the acute phase of infection. The lowest concentration of SARS-CoV-2 viral copies this assay can detect is 250 copies / mL. A negative result does not preclude SARS-CoV-2 infection and should not be used as the sole basis for treatment or other patient management decisions.  A negative result may occur with improper specimen collection / handling, submission of specimen other than nasopharyngeal swab, presence of viral mutation(s) within the areas targeted by this assay, and inadequate number of viral copies (<250 copies / mL). A negative result must be combined with clinical observations, patient history, and epidemiological information.  Fact Sheet for Patients:   StrictlyIdeas.no  Fact Sheet for Healthcare Providers: BankingDealers.co.za  This test is not yet approved or  cleared by the [place] FDA and has been authorized for detection and/or diagnosis of SARS-CoV-2 by FDA under an Emergency Use Authorization (EUA).  This EUA will remain in effect (meaning this test can be used) for the duration of the COVID-19 declaration under Section 564(b)(1) of the Act, 21 U.S.C. section 360bbb-3(b)(1), unless the authorization is terminated or revoked sooner.  Performed at [facility], [address] [place], [place] [address]     Coagulation Studies: No results for input(s): LABPROT, INR in the last 72 hours.  Imaging: No results found.  Medications:  I have reviewed the patient's current medications. Scheduled: . cholecalciferol  2,000 Units Oral Daily  . clobetasol cream  1 application Topical Q Sun  . enoxaparin (LOVENOX) injection  40 mg Subcutaneous Q24H  . famotidine  20 mg Oral BID  . irbesartan  75 mg Oral Daily  . psyllium  1 packet Oral Daily  . sodium chloride flush  3 mL Intravenous Q12H    Assessment/Plan: 79 y.o. female with a past medical  history of CAD, pacemaker, gastric reflux, hypertension, hyperlipidemia, psoriasis, arthritis, presents to the emergency department for worsening numbness and tingling. According to the patient she got her Enbrel injection approximately 1 week ago with total 2 doses first one being beginning of June. She states since she has been experiencing tingling in her feet that has been progressing up her body that started last Wednesday. Patient states initially she saw her doctor who diagnosed her with neuropathy however the numbness and tingling sensation continued to progress up her legs and into her upper extremities as well. Weakness has progressed. Reports she is stable today.No SOB noted on examination today. VC have been stable.  Mild decrease in NIF noted but patient remains clinically stable.  Now using CPAP.   Recommendations:  1. ContinuedNIF and VC.  Will repeat NIF/VC this afternoon and if continued decreases seen patient tobe transferred to step down.   2. Agree with continued  measures to alleviate constipation and hyponatremia 3. Telemetry 4. ContinueIVIg at 400mg /kg/day. Today is day 4 of 5   LOS: 4 days   Alexis Goodell, MD Neurology [phone] 04/01/2020  11:46 AM

## 2020-04-01 NOTE — Progress Notes (Signed)
NIF -18  FVC 1.11 44% predicted

## 2020-04-01 NOTE — Progress Notes (Signed)
Physical Therapy Treatment Patient Details Name: Alison Huffman MRN: [id number] DOB: 11/13/1940 Today's Date: 04/01/2020    History of Present Illness Alison Huffman is an 79 y/o F with PMH: CAD, PPM, gastric reflux, hypertension, hyperlipidemia, psoriasis, arthritis, who presented to the [facility] ED on 6/24 d/t worsening numbness and tingling.  According to the patient she got her Enbrel injection approximately 1 week ago with total 2 doses first one being beginning of June (reports she was previously on Humira). She states since she has been experiencing tingling in her feet that has been progressing up her body that started last Wednesday. Her PCP initially dx with neuropathy, however the numbness and tingling continued to progress up her LEs and into her UEs as well. 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 LP was unremarkable.    PT Comments    Pt was long sitting in bed upon arriving. She reports discomfort and ask to get OOB to recliner. She performed log roll R to short assist with max assist + increased time. Required CGA-min assist to maintain sitting balance. Stood 1 x EOB with max assist from elevated bed height prior to total assist to stand pivot to recliner. Therapist protected knees from buckling in standing. Recommend RN staff use mechanical lift for all transfers at this time. She continues to present with weakness/ataxic movements in all extremities. Further testing in progress. She will require extensive skilled PT at DC to address deficits and assist pt to returning to PLOF. At conclusion of session, pt was seated in recliner with BLEs elevated, call bell in reach, and chair alarm in place. RN aware of pt's abilities.      Follow Up Recommendations  SNF     Equipment Recommendations  None recommended by PT    Recommendations for Other Services       Precautions / Restrictions Precautions Precautions: Fall Precaution Comments: gross limb  ataxia x4 Restrictions Weight Bearing Restrictions: No    Mobility  Bed Mobility Overal bed mobility: Needs Assistance Bed Mobility: Rolling;Sidelying to Sit;Supine to Sit Rolling: Max assist Sidelying to sit: Max assist;+2 for safety/equipment Supine to sit: Max assist;+2 for safety/equipment     General bed mobility comments: Pt was able to roll R to short sit with max assist of 1 + increased time and vcs/tactile cues for safety/sequencing. Once seated EOB, pt required CGA-min to maintain balance. She was able to perform ther ex in bed and seated EOB  Transfers Overall transfer level: Needs assistance Equipment used: None Transfers: Stand Pivot Transfers Sit to Stand: Max assist;Total assist;From elevated surface Stand pivot transfers: Total assist;+2 safety/equipment;From elevated surface       General transfer comment: Pt performed STS 1 x EOB prior to stand pivot to recliner. she requires total assist to safely progress from EOB to recliner. INcreased time to perform. max vcs for pt participation.  Ambulation/Gait             General Gait Details: unsafe/unable at this time   Stairs             Wheelchair Mobility    Modified Rankin (Stroke Patients Only)       Balance Overall balance assessment: Needs assistance Sitting-balance support: Bilateral upper extremity supported;Feet supported Sitting balance-Leahy Scale: Fair Sitting balance - Comments: Pt required CGA-min assist to prevent LOB in sitting   Standing balance support: Bilateral upper extremity supported;During functional activity Standing balance-Leahy Scale: Zero Standing balance comment: pt has very  poor standing balance require total assist to maintain standing                            Cognition Arousal/Alertness: Awake/alert Behavior During Therapy: WFL for tasks assessed/performed Overall Cognitive Status: Within Functional Limits for tasks assessed                                  General Comments: Pt is A and O x 4      Exercises Other Exercises Other Exercises: Pt performed ther ex with BLEs/UEs in supine and in sitting. continues to have ataxic movements and weakness throughout all proximal ms groups.    General Comments        Pertinent Vitals/Pain Pain Assessment: No/denies pain (c/o low back discomfort from being in bed too long)    Home Living                      Prior Function            PT Goals (current goals can now be found in the care plan section) Acute Rehab PT Goals Patient Stated Goal: regain independent mobility Progress towards PT goals: Progressing toward goals    Frequency    7X/week      PT Plan Current plan remains appropriate    Co-evaluation              AM-PAC PT "6 Clicks" Mobility   Outcome Measure  Help needed turning from your back to your side while in a flat bed without using bedrails?: A Lot Help needed moving from lying on your back to sitting on the side of a flat bed without using bedrails?: Total Help needed moving to and from a bed to a chair (including a wheelchair)?: Total Help needed standing up from a chair using your arms (e.g., wheelchair or bedside chair)?: Total Help needed to walk in hospital room?: Total Help needed climbing 3-5 steps with a railing? : Total 6 Click Score: 7    End of Session Equipment Utilized During Treatment: Gait belt Activity Tolerance: Patient tolerated treatment well;Patient limited by fatigue Patient left: in chair;with call bell/phone within reach;with chair alarm set Nurse Communication: Mobility status PT Visit Diagnosis: Unsteadiness on feet (R26.81);Other abnormalities of gait and mobility (R26.89);Difficulty in walking, not elsewhere classified (R26.2);Muscle weakness (generalized) (M62.81)     Time: 1444-1500 PT Time Calculation (min) (ACUTE ONLY): 16 min  Charges:  $Therapeutic Activity: 8-22 mins                     Julaine Fusi PTA 04/01/20, 3:44 PM

## 2020-04-02 ENCOUNTER — Telehealth: Payer: Self-pay

## 2020-04-02 ENCOUNTER — Telehealth: Payer: Self-pay | Admitting: Obstetrics and Gynecology

## 2020-04-02 ENCOUNTER — Ambulatory Visit: Payer: Medicare Other | Admitting: Family Medicine

## 2020-04-02 LAB — HEAVY METALS, BLOOD
Arsenic: 9 ug/L (ref 2–23)
Lead: 1 ug/dL (ref 0–4)
Mercury: 1.2 ug/L (ref 0.0–14.9)

## 2020-04-02 LAB — SODIUM, URINE, RANDOM: Sodium, Ur: 67 mmol/L

## 2020-04-02 LAB — COMPREHENSIVE METABOLIC PANEL
ALT: 41 U/L (ref 0–44)
AST: 54 U/L — ABNORMAL HIGH (ref 15–41)
Albumin: 2.9 g/dL — ABNORMAL LOW (ref 3.5–5.0)
Alkaline Phosphatase: 64 U/L (ref 38–126)
Anion gap: 8 (ref 5–15)
BUN: 12 mg/dL (ref 8–23)
CO2: 22 mmol/L (ref 22–32)
Calcium: 8 mg/dL — ABNORMAL LOW (ref 8.9–10.3)
Chloride: 89 mmol/L — ABNORMAL LOW (ref 98–111)
Creatinine, Ser: 0.43 mg/dL — ABNORMAL LOW (ref 0.44–1.00)
GFR calc Af Amer: >60 mL/min (ref >60)
GFR calc non Af Amer: >60 mL/min (ref >60)
Glucose, Bld: 104 mg/dL — ABNORMAL HIGH (ref 70–99)
Potassium: 3.8 mmol/L (ref 3.5–5.1)
Sodium: 119 mmol/L — CL (ref 135–145)
Total Bilirubin: 0.7 mg/dL (ref 0.3–1.2)
Total Protein: 8.8 g/dL — ABNORMAL HIGH (ref 6.5–8.1)

## 2020-04-02 LAB — CBC
HCT: 36.6 % (ref 36.0–46.0)
Hemoglobin: 13.2 g/dL (ref 12.0–15.0)
MCH: 30.8 pg (ref 26.0–34.0)
MCHC: 36.1 g/dL — ABNORMAL HIGH (ref 30.0–36.0)
MCV: 85.5 fL (ref 80.0–100.0)
Platelets: 222 10*3/uL (ref 150–400)
RBC: 4.28 MIL/uL (ref 3.87–5.11)
RDW: 12.4 % (ref 11.5–15.5)
WBC: 8.5 10*3/uL (ref 4.0–10.5)
nRBC: 0 % (ref 0.0–0.2)

## 2020-04-02 LAB — OSMOLALITY, URINE: Osmolality, Ur: 508 mOsm/kg (ref 300–900)

## 2020-04-02 LAB — OSMOLALITY: Osmolality: 257 mOsm/kg — ABNORMAL LOW (ref 275–295)

## 2020-04-02 LAB — MAGNESIUM: Magnesium: 2.1 mg/dL (ref 1.7–2.4)

## 2020-04-02 MED ORDER — ENOXAPARIN SODIUM 40 MG/0.4ML ~~LOC~~ SOLN: 40.0000 mg | SUBCUTANEOUS | Status: DC

## 2020-04-02 MED ORDER — BISACODYL 10 MG RE SUPP: 10.0000 mg | Freq: Every day | RECTAL | 0 refills | Status: DC | PRN

## 2020-04-02 MED ORDER — POLYETHYLENE GLYCOL 3350 17 G PO PACK: 17.0000 g | PACK | Freq: Every day | ORAL | 0 refills | Status: DC | PRN

## 2020-04-02 MED ORDER — MAGNESIUM CITRATE PO SOLN: 1.0000 | Freq: Once | ORAL | Status: AC

## 2020-04-02 MED ORDER — ONDANSETRON HCL 4 MG PO TABS: 4.0000 mg | ORAL_TABLET | Freq: Four times a day (QID) | ORAL | 0 refills | Status: DC | PRN

## 2020-04-02 MED ORDER — SODIUM CHLORIDE 0.9 % IV SOLN: INTRAVENOUS | Status: DC

## 2020-04-02 NOTE — Consult Note (Signed)
[facility]  CONSULT NOTE    Date: 04/02/2020                  Patient Name:  Alison Huffman  MRN: [id number]  DOB: 30-Jun-1941  Age / Sex: 79 y.o., female         PCP: Leone Haven, MD                 Service Requesting Consult: Dr. Jamse Arn                 Reason for Consult: Hyponatremia            History of Present Illness: Ms. KALISA GIRTMAN is with her niece at bedside. Patient was changed from Humira to Enbrel as outpatient for her psoriatic arthritis. She states the Humira was not effective anymore. 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 She was admitted and started on IVIG which she has gotten four doses of.  Prior to admission, patient's serum sodium was normal at 141. Sodium was low at 130 on 6/26 and has been trending down. Patient denies use of diuretics or SSRIs. Denies any peripheral edema. First dose of IVIG was on 6/28 and following day serum sodium dropped to 124. Today, sodium is 119.  Patient states she thinks the numbness is better but her weakness is the same. Niece who is at bedside, states patient appears sicker and using accessory muscle to breath  Denies dysphagia or vision impairment.    Medications: Outpatient medications: Medications Prior to Admission  Medication Sig Dispense Refill Last Dose  . Calcium Citrate (CITRACAL PO) Take 1 Dose by mouth daily.   03/27/2020 at Unknown time  . Cholecalciferol (VITAMIN D3) 50 MCG (2000 UT) TABS Take 2,000 Units by mouth daily.   03/27/2020 at Unknown time  . Clobetasol Prop Emollient Base (CLOBETASOL PROPIONATE E) 0.05 % emollient cream Apply 1 application topically every Sunday.    Past Week at Unknown time  . rosuvastatin (CRESTOR) 5 MG tablet Take 1 tablet (5 mg total) by mouth daily. 90 tablet 3 03/26/2020 at 2000  . valsartan (DIOVAN) 80 MG tablet TAKE 1 TABLET DAILY (Patient taking differently: Take 80 mg by mouth daily. ) 90  tablet 3 03/27/2020 at 0800  . acetaminophen (TYLENOL) 325 MG tablet Take 325-650 mg by mouth every 6 (six) hours as needed for mild pain or headache.    unknown at prn  . desonide (DESOWEN) 0.05 % ointment Apply 1 application topically 2 (two) times daily as needed (for psoriasis).    unknown at prn  . ENBREL SURECLICK 50 MG/ML injection Inject into the skin once a week. (Patient not taking: Reported on 03/24/2020)     . famotidine (PEPCID) 20 MG tablet Take 20 mg by mouth 2 (two) times daily.   prn at prn  . Methylcellulose, Laxative, (CITRUCEL PO) Take 1 Dose by mouth.   unknown at prn  . metroNIDAZOLE (METROCREAM) 0.75 % cream Apply topically 2 (two) times daily as needed (facial redness/irritation.).    unknown at prn  . nystatin cream (MYCOSTATIN) Apply 1 application topically 2 (two) times daily as needed for dry skin.    unknown at prn  . tacrolimus (PROTOPIC) 0.1 % ointment Apply topically 2 (two) times daily as needed (for psoriasis).    unknown at prn    Current medications: Current Facility-Administered Medications  Medication Dose Route Frequency Provider Last Rate Last  Admin  . acetaminophen (TYLENOL) tablet 650 mg  650 mg Oral Q6H PRN Agbata, Tochukwu, MD       Or  . acetaminophen (TYLENOL) suppository 650 mg  650 mg Rectal Q6H PRN Agbata, Tochukwu, MD      . acetaminophen (TYLENOL) tablet 325-650 mg  325-650 mg Oral Q6H PRN Agbata, Tochukwu, MD      . bisacodyl (DULCOLAX) suppository 10 mg  10 mg Rectal Daily PRN Lorella Nimrod, MD      . cholecalciferol (VITAMIN D) tablet 2,000 Units  2,000 Units Oral Daily Agbata, Tochukwu, MD   2,000 Units at 04/02/20 0952  . clobetasol cream (TEMOVATE) 9.47 % 1 application  1 application Topical Q Sun Agbata, Tochukwu, MD      . desonide (DESOWEN) 0.05 % ointment 1 application  1 application Topical BID PRN Agbata, Tochukwu, MD      . dextrose 5 % solution 50 mL  50 mL Intravenous Once PRN Jamse Arn, Dewaine Oats Tublu, MD      . dextrose 5 %  solution 50 mL  50 mL Intravenous Once PRN Bonnell Public Tublu, MD      . enoxaparin (LOVENOX) injection 40 mg  40 mg Subcutaneous Q24H Agbata, Tochukwu, MD   40 mg at 04/01/20 1734  . famotidine (PEPCID) tablet 20 mg  20 mg Oral BID Agbata, Tochukwu, MD   20 mg at 04/02/20 0955  . Immune Globulin 10% (PRIVIGEN) IV infusion 30 g  400 mg/kg Intravenous Q24 Hr x 5 Alexis Goodell, MD 22 mL/hr at 03/31/20 2102 30 g at 04/01/20 2137  . irbesartan (AVAPRO) tablet 75 mg  75 mg Oral Daily Agbata, Tochukwu, MD   75 mg at 04/02/20 0958  . magnesium citrate solution 1 Bottle  1 Bottle Oral Once Bonnell Public Tublu, MD      . ondansetron (ZOFRAN) tablet 4 mg  4 mg Oral Q6H PRN Agbata, Tochukwu, MD       Or  . ondansetron (ZOFRAN) injection 4 mg  4 mg Intravenous Q6H PRN Agbata, Tochukwu, MD      . polyethylene glycol (MIRALAX / GLYCOLAX) packet 17 g  17 g Oral Daily PRN Bonnell Public Tublu, MD   17 g at 04/02/20 1134  . psyllium (HYDROCIL/METAMUCIL) packet 1 packet  1 packet Oral Daily Agbata, Tochukwu, MD   1 packet at 04/02/20 0958  . sodium chloride flush (NS) 0.9 % injection 3 mL  3 mL Intravenous Q12H Agbata, Tochukwu, MD   3 mL at 04/02/20 0959  . sodium chloride flush (NS) 0.9 % injection 3 mL  3 mL Intravenous PRN Agbata, Tochukwu, MD      . tacrolimus (PROTOPIC) 0.1 % ointment   Topical BID PRN Agbata, Tochukwu, MD          Allergies: Allergies  Allergen Reactions  . Nickel       Past Medical History: Past Medical History:  Diagnosis Date  . Ankle pain, right   . Atypical chest pain    a. 10/2018 MV: EF 57%. No ischemia/infarct.  . Coronary artery disease   . Diastolic dysfunction    a. 04/2018 Echo: EF 55-60%, no rwma, Gr1 DD. Triv AI, mild MR. Mod dil LA. Nl RV fxn. PASP nl.  . GERD (gastroesophageal reflux disease)   . Headache    "dull one sometimes daily, at least weekly in last couple months" (07/22/2018)  . Hyperlipidemia   . Hypertension   . 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 Joint pain  in fingers of right hand   . Lichen sclerosus   . Osteopenia   . Pneumonia 2012? X 1  . 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 Psoriasis   . Psoriatic arthritis ([place])    " dx'd the 1st of this year, 2019" (07/22/2018)  . Psoriatic arthritis ([place])   . Rosacea   . Seborrheic keratosis   . Second degree heart block    a. 07/2018 s/p SJM Assurity MRI model [id number] (Ser# [id number]).  . Sleep apnea    "using nasal pilllows since ~ 12/2017" (07/22/2018)  . TMJ (dislocation of temporomandibular joint)      Past Surgical History: Past Surgical History:  Procedure Laterality Date  . APPENDECTOMY    . BREAST CYST ASPIRATION Bilateral   . BREAST CYST EXCISION Right 1978   benign  . COLONOSCOPY    . COLONOSCOPY WITH PROPOFOL N/A 07/10/2019   Procedure: COLONOSCOPY WITH PROPOFOL;  Surgeon: Lollie Sails, MD;  [facility] ENDOSCOPY;  Service: Endoscopy;  Laterality: N/A;  . ESOPHAGOGASTRODUODENOSCOPY (EGD) WITH PROPOFOL N/A 07/10/2019   Procedure: ESOPHAGOGASTRODUODENOSCOPY (EGD) WITH PROPOFOL;  Surgeon: Lollie Sails, MD;  Location: [facility] ENDOSCOPY;  Service: Endoscopy;  Laterality: N/A;  . HYSTEROSCOPY WITH D & C N/A 11/05/2018   Procedure: DILATATION AND CURETTAGE /HYSTEROSCOPY;  Surgeon: Homero Fellers, MD;  Location: [facility] ORS;  Service: Gynecology;  Laterality: N/A;  . INSERT / REPLACE / REMOVE PACEMAKER    . 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 UTERINE POLYPS REMOVAL       Family History: Family History  Problem Relation Age of Onset  . Stroke Mother   . Atrial fibrillation Mother   . Breast cancer Mother   . Stroke Father   . Breast cancer Maternal Aunt   . Breast cancer Paternal Aunt      Social History: Social History   Socioeconomic History  . Marital status: Single    Spouse name: Not on file  . Number of children: Not on file   . Years of education: Not on file  . Highest education level: Not on file  Occupational History  . Not on file  Tobacco Use  . Smoking status: Former Smoker    Types: Cigarettes  . Smokeless tobacco: Never Used  . Tobacco comment: 07/22/2018 "smoked some in college; 1960s;  nothing since"  Vaping Use  . Vaping Use: Never used  Substance and Sexual Activity  . Alcohol use: Yes    Comment: rarely  . Drug use: Never  . Sexual activity: Not Currently    Birth control/protection: Post-menopausal  Other Topics Concern  . Not on file  Social History Narrative  . Not on file   Social Determinants of Health   Financial Resource Strain:   . Difficulty of Paying Living Expenses:   Food Insecurity:   . Worried About Charity fundraiser in the Last Year:   . Arboriculturist in the Last Year:   Transportation Needs:   . Film/video editor (Medical):   [name] Lack of Transportation (Non-Medical):   Physical Activity:   . Days of Exercise per Week:   . Minutes of Exercise per Session:   Stress:   . Feeling of Stress :   Social Connections:   . Frequency of Communication with Friends and Family:   . Frequency of Social Gatherings with Friends and Family:   .  Attends Religious Services:   . Active Member of Clubs or Organizations:   . Attends Archivist Meetings:   [name] Marital Status:   Intimate Partner Violence:   . Fear of Current or Ex-Partner:   . Emotionally Abused:   [name] Physically Abused:   . Sexually Abused:      Review of Systems: Review of Systems  Constitutional: Positive for malaise/fatigue. Negative for chills, diaphoresis, fever and weight loss.  HENT: Negative.  Negative for congestion, ear discharge, ear pain, hearing loss, nosebleeds, sinus pain, sore throat and tinnitus.   Eyes: Negative.  Negative for blurred vision, double vision, photophobia, pain, discharge and redness.  Respiratory: Positive for shortness of breath. Negative for cough, hemoptysis,  sputum production, wheezing and stridor.   Cardiovascular: Negative.  Negative for chest pain, palpitations, orthopnea, claudication, leg swelling and PND.  Gastrointestinal: Negative.  Negative for abdominal pain, blood in stool, constipation, diarrhea, heartburn, melena, nausea and vomiting.  Genitourinary: Negative.  Negative for dysuria, flank pain, frequency, hematuria and urgency.  Musculoskeletal: Negative.  Negative for back pain, falls, joint pain, myalgias and neck pain.  Skin: Negative.  Negative for itching and rash.  Neurological: Positive for dizziness, tingling, sensory change, speech change, focal weakness and weakness. Negative for seizures and loss of consciousness.  Endo/Heme/Allergies: Negative.   Psychiatric/Behavioral: Negative.  Negative for depression, hallucinations, memory loss, substance abuse and suicidal ideas. The patient is not nervous/anxious and does not have insomnia.     Vital Signs: Blood pressure (!) 143/79, pulse 80, temperature 97.8 F (36.6 C), temperature source Oral, resp. rate 16, height 5\' 6"  (1.676 m), weight 76.2 kg, SpO2 95 %.  Weight trends: Filed Weights   03/27/20 1024  Weight: 76.2 kg    Physical Exam: General: NAD,   Head: Normocephalic, atraumatic. Moist oral mucosal membranes  Eyes: Anicteric, PERRL  Neck: Supple, trachea midline  Lungs:  Clear to auscultation  Heart: Regular rate and rhythm  Abdomen:  Soft, nontender,   Extremities:  no peripheral edema.  Neurologic: 4/5 strength in all four extremities. Fine motor movements are deficient.   Skin: No lesions        Lab results: Basic Metabolic Panel: Recent Labs  Lab 03/31/20 0415 04/01/20 0443 04/02/20 0609  NA 124* 125* 119*  K 3.8 3.7 3.8  CL 94* 95* 89*  CO2 25 25 22   GLUCOSE 109* 102* 104*  BUN 11 13 12   CREATININE 0.55 0.47 0.43*  CALCIUM 7.9* 8.1* 8.0*  MG  --   --  2.1    Liver Function Tests: Recent Labs  Lab 04/02/20 0609  AST 54*  ALT 41   ALKPHOS 64  BILITOT 0.7  PROT 8.8*  ALBUMIN 2.9*   No results for input(s): LIPASE, AMYLASE in the last 168 hours. No results for input(s): AMMONIA in the last 168 hours.  CBC: Recent Labs  Lab 03/28/20 0548 03/28/20 0548 03/30/20 0324 03/30/20 0324 03/31/20 0415 04/01/20 0443 04/02/20 0609  WBC 5.5   < > 6.5   < > 6.1 5.3 8.5  HGB 13.7   < > 13.1   < > 12.6 12.3 13.2  HCT 39.8   < > 38.4   < > 37.7 35.9* 36.6  MCV 88.2  --  89.5  --  90.0 88.6 85.5  PLT 223   < > 215   < > 215 209 222   < > = values in this interval not displayed.    Cardiac Enzymes: No  results for input(s): CKTOTAL, CKMB, CKMBINDEX, TROPONINI in the last 168 hours.  BNP: Invalid input(s): POCBNP  CBG: No results for input(s): GLUCAP in the last 168 hours.  Microbiology: Results for orders placed or performed during the hospital encounter of 03/27/20  SARS Coronavirus 2 by RT PCR (hospital order, performed in [facility] hospital lab) Nasopharyngeal Nasopharyngeal Swab     Status: None   Collection Time: 03/27/20  1:55 PM   Specimen: Nasopharyngeal Swab  Result Value Ref Range Status   SARS Coronavirus 2 NEGATIVE NEGATIVE Final    Comment: (NOTE) SARS-CoV-2 target nucleic acids are NOT DETECTED.  The SARS-CoV-2 RNA is generally detectable in upper and lower respiratory specimens during the acute phase of infection. The lowest concentration of SARS-CoV-2 viral copies this assay can detect is 250 copies / mL. A negative result does not preclude SARS-CoV-2 infection and should not be used as the sole basis for treatment or other patient management decisions.  A negative result may occur with improper specimen collection / handling, submission of specimen other than nasopharyngeal swab, presence of viral mutation(s) within the areas targeted by this assay, and inadequate number of viral copies (<250 copies / mL). A negative result must be combined with clinical observations, patient history, and  epidemiological information.  Fact Sheet for Patients:   StrictlyIdeas.no  Fact Sheet for Healthcare Providers: BankingDealers.co.za  This test is not yet approved or  cleared by the [place] FDA and has been authorized for detection and/or diagnosis of SARS-CoV-2 by FDA under an Emergency Use Authorization (EUA).  This EUA will remain in effect (meaning this test can be used) for the duration of the COVID-19 declaration under Section 564(b)(1) of the Act, 21 U.S.C. section 360bbb-3(b)(1), unless the authorization is terminated or revoked sooner.  Performed at [facility], [address] [place], [place] [address]     Coagulation Studies: No results for input(s): LABPROT, INR in the last 72 hours.  Urinalysis: No results for input(s): COLORURINE, LABSPEC, PHURINE, GLUCOSEU, HGBUR, BILIRUBINUR, KETONESUR, PROTEINUR, UROBILINOGEN, NITRITE, LEUKOCYTESUR in the last 72 hours.  Invalid input(s): APPERANCEUR    Imaging:  No results found.   Assessment & Plan: Ms. NIAMYA VITTITOW is a 79 y.o. white female with psoriatic arthritis, hypertension, congestive heart failure, sleep apnea, coronary artery disease, GERD, who was admitted to [facility] on 03/27/2020 for Weakness [R53.1] Numbness and tingling of both feet [R20.0, R20.2] Ambulatory dysfunction [R26.2]  1. Hyponatremia: serum osm 257. Secondary to dilution effect of IVIG.  - fluid restriction  - Recommend discontinuation of IVIG - Checking urine osm and urine sodium. If urine osm is low, may continue fluid restriction and may start salt tabs. If urine osm is high, we can start normal saline infusion.   LOS: 5 Marvell Stavola 7/2/20211:13 PM

## 2020-04-02 NOTE — Progress Notes (Signed)
NIF -20  FVC 1.03  36% predicted

## 2020-04-02 NOTE — Telephone Encounter (Signed)
Alison Huffman, pt's niece, calling; pt currently in hosp; has appt; need to discuss.  Please call.  [phone]  [name] is on DPR; states pt is going to be tx'd to [facility] today; is not in any shape to have surgery; please cxl appt 05/14/20.  Will call back if/when pt is able.

## 2020-04-02 NOTE — Progress Notes (Signed)
Report given to Salvatore Decent RN at [facility] in [place]. Report given to Altamese Dilling paramedic through [place]. Sharion Settler NP assessed/ released patient as well.

## 2020-04-02 NOTE — Plan of Care (Signed)

## 2020-04-02 NOTE — Telephone Encounter (Signed)
Patient's family member phoned on this date and stated that patient was in the hospital and that she would not be able to attend appt on 04-07-20 as she was in the hospital and would soon be transferred to [facility]. Appt on 7-7-201 has been cancelled. Family is to phone back at a later time to reschedule.

## 2020-04-02 NOTE — Telephone Encounter (Signed)
Spoke to [employer], pt's niece, regarding pt. She has had what they believe to be and adverse reaction to Rx for psoriatic arthritis and experiencing neuropathy in legs and arms. She is currently in process of being transferred to [facility] where they have MRI that can accommodate her pace maker and some other therapies that are available there. Both pt and niece feel that an August surgery is not possible. They have canceled her office appt with Dr. Theora Gianotti as well.   I have the case on the OR board but will wait for you to adv the future of this case. Please adv.

## 2020-04-02 NOTE — Progress Notes (Signed)
PT Cancellation Note  Patient Details Name: Alison Huffman MRN: [id number] DOB: [birth year]   Cancelled Treatment:     PT attempt. Pt's sodium 119 and per PT protocol will hold. RN/neice in room and report that [facility]pl[facility].    Willette Pa 04/02/2020, 1:47 PM

## 2020-04-02 NOTE — Progress Notes (Signed)
NIF -18

## 2020-04-02 NOTE — Progress Notes (Signed)
Subjective: No improvement with NIFF of -20. S/p 5 doses of IVIG  Objective: Current vital signs: BP (!) 143/79 (BP Location: Right Arm)   Pulse 80   Temp 97.8 F (36.6 C) (Oral)   Resp 16   Ht 5\' 6"  (1.676 m)   Wt 76.2 kg   SpO2 95%   BMI 27.12 kg/m  Vital signs in last 24 hours: Temp:  [97.5 F (36.4 C)-98.8 F (37.1 C)] 97.8 F (36.6 C) (07/02 0749) Pulse Rate:  [73-89] 80 (07/02 0749) Resp:  [14-20] 16 (07/02 0749) BP: (128-164)/(74-88) 143/79 (07/02 0749) SpO2:  [93 %-97 %] 95 % (07/02 0749)  Intake/Output from previous day: 07/01 0701 - 07/02 0700 In: 340 [P.O.:340] Out: -  Intake/Output this shift: Total I/O In: 240 [P.O.:240] Out: -  Nutritional status:  Diet Order            Diet renal with fluid restriction Fluid restriction: 1200 mL Fluid; Room service appropriate? Yes; Fluid consistency: Thin  Diet effective now                 Neurologic Exam: Mental Status: Alert, oriented, thought content appropriate. Speech fluent without evidence of aphasia. Able to follow 3 step commands without difficulty. Cranial Nerves: II: Visual fields grossly normal, pupils equal, round, reactive to light and accommodation III,IV, VI: ptosis not present, extra-ocular motions intact bilaterally V,VII: smile symmetricbut opens mouth very little for conversation, facial light touch sensation normal bilaterally VIII: hearing normal bilaterally IX,X: gag reflex present XI: bilateral shoulder shrug XII: midline tongue extension Motor: Unable to lift arms or maintain against gravity at the shoulders but is able to extend arms out on lap. Elbow flexion and extension as well as hand grip at 4/5. Able to lift both lower extremityabout 6inches off the bed. 5/5 ankle flexion and extension noted. Sensory: Pinprick and light touchdecreased in the lower extremities to include the abdomen and buttocks. Also decreased in the hands bilaterally Deep Tendon Reflexes:1+ in the  upper extremities and absent in the lower extremities. Plantars: Right:muteLeft: mute  Lab Results: Basic Metabolic Panel: Recent Labs  Lab 03/29/20 0527 03/29/20 0527 03/30/20 0324 03/30/20 0324 03/31/20 0415 04/01/20 0443 04/02/20 0609  NA 133*  --  129*  --  124* 125* 119*  K 3.9  --  3.8  --  3.8 3.7 3.8  CL 101  --  98  --  94* 95* 89*  CO2 26  --  27  --  25 25 22   GLUCOSE 105*  --  108*  --  109* 102* 104*  BUN 12  --  12  --  11 13 12   CREATININE 0.57  --  0.44  --  0.55 0.47 0.43*  CALCIUM 8.7*   < > 8.1*   < > 7.9* 8.1* 8.0*  MG  --   --   --   --   --   --  2.1   < > = values in this interval not displayed.    Liver Function Tests: Recent Labs  Lab 04/02/20 0609  AST 54*  ALT 41  ALKPHOS 64  BILITOT 0.7  PROT 8.8*  ALBUMIN 2.9*   No results for input(s): LIPASE, AMYLASE in the last 168 hours. No results for input(s): AMMONIA in the last 168 hours.  CBC: Recent Labs  Lab 03/28/20 0548 03/30/20 0324 03/31/20 0415 04/01/20 0443 04/02/20 0609  WBC 5.5 6.5 6.1 5.3 8.5  HGB 13.7 13.1 12.6 12.3 13.2  HCT 39.8 38.4 37.7 35.9* 36.6  MCV 88.2 89.5 90.0 88.6 85.5  PLT 223 215 215 209 222    Cardiac Enzymes: No results for input(s): CKTOTAL, CKMB, CKMBINDEX, TROPONINI in the last 168 hours.  Lipid Panel: No results for input(s): CHOL, TRIG, HDL, CHOLHDL, VLDL, LDLCALC in the last 168 hours.  CBG: No results for input(s): GLUCAP in the last 168 hours.  Microbiology: Results for orders placed or performed during the hospital encounter of 03/27/20  SARS Coronavirus 2 by RT PCR (hospital order, performed in [facility] hospital lab) Nasopharyngeal Nasopharyngeal Swab     Status: None   Collection Time: 03/27/20  1:55 PM   Specimen: Nasopharyngeal Swab  Result Value Ref Range Status   SARS Coronavirus 2 NEGATIVE NEGATIVE Final    Comment: (NOTE) SARS-CoV-2 target nucleic acids are NOT DETECTED.  The SARS-CoV-2 RNA  is generally detectable in upper and lower respiratory specimens during the acute phase of infection. The lowest concentration of SARS-CoV-2 viral copies this assay can detect is 250 copies / mL. A negative result does not preclude SARS-CoV-2 infection and should not be used as the sole basis for treatment or other patient management decisions.  A negative result may occur with improper specimen collection / handling, submission of specimen other than nasopharyngeal swab, presence of viral mutation(s) within the areas targeted by this assay, and inadequate number of viral copies (<250 copies / mL). A negative result must be combined with clinical observations, patient history, and epidemiological information.  Fact Sheet for Patients:   StrictlyIdeas.no  Fact Sheet for Healthcare Providers: BankingDealers.co.za  This test is not yet approved or  cleared by the [place] FDA and has been authorized for detection and/or diagnosis of SARS-CoV-2 by FDA under an Emergency Use Authorization (EUA).  This EUA will remain in effect (meaning this test can be used) for the duration of the COVID-19 declaration under Section 564(b)(1) of the Act, 21 U.S.C. section 360bbb-3(b)(1), unless the authorization is terminated or revoked sooner.  Performed at [facility], [address] [place], [place] [address]     Coagulation Studies: No results for input(s): LABPROT, INR in the last 72 hours.  Imaging: No results found.  Medications:  I have reviewed the patient's current medications. Scheduled: . cholecalciferol  2,000 Units Oral Daily  . clobetasol cream  1 application Topical Q Sun  . enoxaparin (LOVENOX) injection  40 mg Subcutaneous Q24H  . famotidine  20 mg Oral BID  . irbesartan  75 mg Oral Daily  . magnesium citrate  1 Bottle Oral Once  . psyllium  1 packet Oral Daily  . sodium chloride flush  3 mL Intravenous Q12H     Assessment/Plan: 79 y.o. female with a past medical history of CAD, pacemaker, gastric reflux, hypertension, hyperlipidemia, psoriasis, arthritis, presents to the emergency department for worsening numbness and tingling. According to the patient she got her Enbrel injection approximately 1 week ago with total 2 doses first one being beginning of June. She states since she has been experiencing tingling in her feet that has been progressing up her body that started last Wednesday. Patient states initially she saw her doctor who diagnosed her with neuropathy however the numbness and tingling sensation continued to progress up her legs and into her upper extremities as well. Weakness has progressed. Reports she is stable today.No SOB noted on examination today. VC have been stable.  Mild decrease in NIF noted but patient remains clinically stable.  Now using  CPAP.   Recommendations:  1. ContinuedNIF and VC.  Will repeat NIF/VC this afternoon and if continued decreases seen patient tobe transferred to step down.   2. Agree with continued measures to alleviate constipation and hyponatremia 3. Telemetry 4. ContinueIVIg at 400mg /kg/day. Today is day 4 of 5   LOS: 5 days   Alexis Goodell, MD Neurology [phone] 04/02/2020  12:10 PM

## 2020-04-02 NOTE — Progress Notes (Signed)
NIF -16 BEST OUT OF THREE

## 2020-04-02 NOTE — Care Management Important Message (Signed)
Important Message  Patient Details  Name: Alison Huffman MRN: [id number] Date of Birth: [birth year]   Medicare Important Message Given:  Yes     Juliann Pulse A Chivon Lepage 04/02/2020, 11:07 AM

## 2020-04-02 NOTE — Discharge Summary (Signed)
Alison Huffman AXE:[id number] DOB: [birth year] DOA: 03/27/2020  PCP: Leone Haven, MD  Admit date: 03/27/2020  Discharge date: 04/02/2020  Admitted From: Home   disposition: [facility]   Recommendations for Outpatient Follow-up:  Not applicable  Home Health: N/A Equipment/Devices: N/A Consultations: Neurology, renal Discharge Condition: With some improvement CODE STATUS: Full Diet Recommendation: Fluid restriction 1200 cc  Diet Order            Diet renal with fluid restriction Fluid restriction: 1200 mL Fluid; Room service appropriate? Yes; Fluid consistency: Thin  Diet effective now           Diet - low sodium heart healthy                  Chief Complaint  Patient presents with  . Weakness     Brief history of present illness from the day of admission and additional interim summary     Patient is an 79 year old female with CAD, GERD, HTN, HL and psoriasis who was changed from her usual Humira to Enbrel to control her psoriasis 3 weeks ago.  1 week after receiving her Enbrel shot patient developed numbness and tingling in her lower extremities.  She was seen by her PCP who thought this was likely a sensory neuropathy.  Patient subsequently noted increasing weakness in her legs and her arms over the next few days.  Patient presented to [facility] on 03/27/2020 with complaints of progressive weakness and paresthesias with new difficulty walking.  Patient was admitted for a sending sensorimotor neuropathy.                                                                 Hospital Course    Patient was seen by neurology in the ED due to concern for Guillain-Barr syndrome.  Patient underwent an LP which was noted to be normal with normal WBCs, glucose and protein the protein  level was only 33.  No MRI could be done due to pacemaker.  Apparently MRIs with pacemaker in place are not done at [facility], only at [facility].    Given known neuropathy associated with Enbrel use, patient was treated with IVIG x5 days.  Over the course of the treatment, patient states that her numbness and tingling have improved and she has improved sensation in her lower extremities.  However she continued to have proximal and distal muscle weakness.  She has become bedbound with difficulty even raising her arms to feed herself.  She really can only move her forearms and her distal legs.  She was seen by physical therapy and is requiring full support with recommendations for lift for transfer.   Patient's NIFs were followed and have all ranged from 17-24 and have mostly maintained themselves in the -20 range.  Patient was able to maintain his O2 saturations however she was noted to appear short of breath with speaking.   Patient has subsequently developed hyponatremia with the initiation of IVIG therapy.  Sodiums initially in the mid 130s had dropped to the mid 120s.  Today sodium was 119.  Serum osmolality was low at 257, ruling out pseudohyponatremia.  Patient was started on 1200 cc fluid restriction.  Patient was seen by renal who agreed this was likely secondary to dilution effect of IVIG daughter.  Urine osmolality is pending.  If urine osmolality is low, continue fluid restriction and start salt tablets, if it is high can hydrate with normal saline.  Given no improvement with IVIG and inability to pursue MRI due to pacemaker at this campus and need for further diagnostics and possible plasmapheresis, patient was discussed with neurology, Dr. Cheral Marker who agreed patient should be transferred to [facility].  He recommended getting MRI of cervical, thoracic and lumbar spine with and without contrast once patient arrives at the [facility].  He also would like to be contacted once patient gets there.   Discharge  diagnosis     Principal Problem:   Acute motor and sensory axonal neuropathy Active Problems:   Hypertension   OSA (obstructive sleep apnea)   GERD (gastroesophageal reflux disease)   Numbness and tingling of lower extremity   Numbness and tingling of both feet   Ambulatory dysfunction    Discharge instructions    Discharge Instructions    Diet - low sodium heart healthy   Complete by: As directed    Increase activity slowly   Complete by: As directed       Discharge Medications   Allergies as of 04/02/2020      Reactions   Nickel       Medication List    STOP taking these medications   CITRACAL PO   Enbrel SureClick 50 MG/ML injection Generic drug: etanercept   Vitamin D3 50 MCG (2000 UT) Tabs     TAKE these medications   acetaminophen 325 MG tablet Commonly known as: TYLENOL Take 325-650 mg by mouth every 6 (six) hours as needed for mild pain or headache.   bisacodyl 10 MG suppository Commonly known as: DULCOLAX Place 1 suppository (10 mg total) rectally daily as needed for moderate constipation.   CITRUCEL PO Take 1 Dose by mouth.   Clobetasol Propionate E 0.05 % emollient cream Generic drug: Clobetasol Prop Emollient Base Apply 1 application topically every Sunday.   desonide 0.05 % ointment Commonly known as: DESOWEN Apply 1 application topically 2 (two) times daily as needed (for psoriasis).   enoxaparin 40 MG/0.4ML injection Commonly known as: LOVENOX Inject 0.4 mLs (40 mg total) into the skin daily.   famotidine 20 MG tablet Commonly known as: PEPCID Take 20 mg by mouth 2 (two) times daily.   magnesium citrate Soln Take 296 mLs (1 Bottle total) by mouth once for 1 dose.   metroNIDAZOLE 0.75 % cream Commonly known as: METROCREAM Apply topically 2 (two) times daily as needed (facial redness/irritation.).   nystatin cream Commonly known as: MYCOSTATIN Apply 1 application topically 2 (two) times daily as needed for dry skin.     ondansetron 4 MG tablet Commonly known as: ZOFRAN Take 1 tablet (4 mg total) by mouth every 6 (six) hours as needed for nausea.   polyethylene glycol 17 g packet Commonly known as: MIRALAX / GLYCOLAX Take 17 g by mouth daily as needed for severe constipation.  rosuvastatin 5 MG tablet Commonly known as: CRESTOR Take 1 tablet (5 mg total) by mouth daily.   tacrolimus 0.1 % ointment Commonly known as: PROTOPIC Apply topically 2 (two) times daily as needed (for psoriasis).   valsartan 80 MG tablet Commonly known as: DIOVAN TAKE 1 TABLET DAILY         Major procedures and Radiology Reports - PLEASE review detailed and final reports thoroughly  -      CT ANGIO HEAD W OR WO CONTRAST  Result Date: 03/27/2020 CLINICAL DATA:  Weakness arms in legs.  Neck pain EXAM: CT ANGIOGRAPHY HEAD AND NECK TECHNIQUE: Multidetector CT imaging of the head and neck was performed using the standard protocol during bolus administration of intravenous contrast. Multiplanar CT image reconstructions and MIPs were obtained to evaluate the vascular anatomy. Carotid stenosis measurements (when applicable) are obtained utilizing NASCET criteria, using the distal internal carotid diameter as the denominator. CONTRAST:  17mL OMNIPAQUE IOHEXOL 350 MG/ML SOLN COMPARISON:  None. FINDINGS: CT HEAD FINDINGS Brain: No evidence of acute infarction, hemorrhage, hydrocephalus, extra-axial collection or mass lesion/mass effect. Vascular: Negative for hyperdense vessel Skull: Negative Sinuses: Negative Orbits: Negative Review of the MIP images confirms the above findings CTA NECK FINDINGS Aortic arch: Standard branching. Imaged portion shows no evidence of aneurysm or dissection. No significant stenosis of the major arch vessel origins. Mild atherosclerotic disease in the aortic arch and proximal great vessels. Right carotid system: Right carotid artery widely patent without stenosis. Small calcification right carotid  bifurcation. Left carotid system: Left carotid widely patent without stenosis. Mild atherosclerotic calcification left carotid bifurcation Vertebral arteries: Both vertebral arteries patent to the basilar without stenosis. Skeleton: Mild degenerative changes in the cervical spine without acute abnormality. Other neck: Negative for mass or adenopathy in neck Upper chest: Left-sided pacemaker. Left arm contrast injection. There is stenosis or occlusion of the left innominate vein with opacification of numerous collateral vessels in the neck and around the lower cervical spine. Review of the MIP images confirms the above findings CTA HEAD FINDINGS Anterior circulation: Mild atherosclerotic calcification in the cavernous carotid bilaterally without stenosis. Anterior and middle cerebral arteries widely patent bilaterally without stenosis or branch occlusion. Posterior circulation: Both vertebral arteries patent to the basilar. PICA patent bilaterally. Basilar widely patent. Superior cerebellar and posterior cerebral arteries patent bilaterally. Fetal origin of the left posterior cerebral artery. Venous sinuses: Normal venous enhancement Anatomic variants: None Review of the MIP images confirms the above findings IMPRESSION: 1. No acute intracranial abnormality.  Negative CT head 2. No significant carotid or vertebral artery stenosis in the neck. No intracranial stenosis or large vessel occlusion 3. 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 Electronically Signed   By: Franchot Gallo M.D.   On: 03/27/2020 13:06   DG Chest 2 View  Result Date: 03/29/2020 CLINICAL DATA:  Shortness of breath, abdominal distension, no bowel movement for 5-6 days, history coronary artery disease, diastolic dysfunction, hypertension EXAM: CHEST - 2 VIEW COMPARISON:  07/26/2018 FINDINGS: LEFT subclavian sequential transvenous pacemaker leads project at RIGHT atrium and RIGHT ventricle.  Normal heart size, mediastinal contours, and pulmonary vascularity. Atherosclerotic calcification aorta. Minimal bibasilar atelectasis. Otherwise clear. No infiltrate, pleural effusion or pneumothorax. Bones demineralized. IMPRESSION: Minimal bibasilar atelectasis. Aortic Atherosclerosis (ICD10-I70.0). Electronically Signed   By: Lavonia Dana M.D.   On: 03/29/2020 11:39   DG Abd 1 View  Result Date: 03/29/2020 CLINICAL DATA:  Shortness of breath, abdominal distension, no bowel movement for  5-6 days EXAM: ABDOMEN - 1 VIEW COMPARISON:  None FINDINGS: Increased stool in RIGHT colon. Remaining bowel gas pattern normal. Scattered air-filled loops of nondistended large and small bowel throughout remainder of abdomen. No bowel wall thickening or obstruction. Bones demineralized. IMPRESSION: Increased stool RIGHT colon. Electronically Signed   By: Lavonia Dana M.D.   On: 03/29/2020 11:40   CT Angio Neck W and/or Wo Contrast  Result Date: 03/27/2020 CLINICAL DATA:  Weakness arms in legs.  Neck pain EXAM: CT ANGIOGRAPHY HEAD AND NECK TECHNIQUE: Multidetector CT imaging of the head and neck was performed using the standard protocol during bolus administration of intravenous contrast. Multiplanar CT image reconstructions and MIPs were obtained to evaluate the vascular anatomy. Carotid stenosis measurements (when applicable) are obtained utilizing NASCET criteria, using the distal internal carotid diameter as the denominator. CONTRAST:  15mL OMNIPAQUE IOHEXOL 350 MG/ML SOLN COMPARISON:  None. FINDINGS: CT HEAD FINDINGS Brain: No evidence of acute infarction, hemorrhage, hydrocephalus, extra-axial collection or mass lesion/mass effect. Vascular: Negative for hyperdense vessel Skull: Negative Sinuses: Negative Orbits: Negative Review of the MIP images confirms the above findings CTA NECK FINDINGS Aortic arch: Standard branching. Imaged portion shows no evidence of aneurysm or dissection. No significant stenosis of the major  arch vessel origins. Mild atherosclerotic disease in the aortic arch and proximal great vessels. Right carotid system: Right carotid artery widely patent without stenosis. Small calcification right carotid bifurcation. Left carotid system: Left carotid widely patent without stenosis. Mild atherosclerotic calcification left carotid bifurcation Vertebral arteries: Both vertebral arteries patent to the basilar without stenosis. Skeleton: Mild degenerative changes in the cervical spine without acute abnormality. Other neck: Negative for mass or adenopathy in neck Upper chest: Left-sided pacemaker. Left arm contrast injection. There is stenosis or occlusion of the left innominate vein with opacification of numerous collateral vessels in the neck and around the lower cervical spine. Review of the MIP images confirms the above findings CTA HEAD FINDINGS Anterior circulation: Mild atherosclerotic calcification in the cavernous carotid bilaterally without stenosis. Anterior and middle cerebral arteries widely patent bilaterally without stenosis or branch occlusion. Posterior circulation: Both vertebral arteries patent to the basilar. PICA patent bilaterally. Basilar widely patent. Superior cerebellar and posterior cerebral arteries patent bilaterally. Fetal origin of the left posterior cerebral artery. Venous sinuses: Normal venous enhancement Anatomic variants: None Review of the MIP images confirms the above findings IMPRESSION: 1. No acute intracranial abnormality.  Negative CT head 2. No significant carotid or vertebral artery stenosis in the neck. No intracranial stenosis or large vessel occlusion 3. 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 Electronically Signed   By: Franchot Gallo M.D.   On: 03/27/2020 13:06   CT C-SPINE NO CHARGE  Result Date: 03/29/2020 CLINICAL DATA:  Worsening numbness and tingling.  Neck pain. EXAM: CT CERVICAL SPINE WITHOUT CONTRAST  TECHNIQUE: Multidetector CT imaging of the cervical spine was performed without intravenous contrast. Multiplanar CT image reconstructions were also generated. COMPARISON:  CT angio head and neck 03/27/2020 FINDINGS: Request was made for cervical spine reconstruction from the CT angio head and neck data performed on 03/27/2020. Alignment: Normal.  Mild scoliosis. Skull base and vertebrae: Negative for fracture. No focal skeletal lesion identified. Soft tissues and spinal canal: Extensive venous reflux into the paravertebral veins extending into the vertebral bodies. 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 Left arm contrast injection. No soft tissue mass or edema.  Vascular findings per CTA report Disc levels:  C2-3: Negative C3-4: Mild disc degeneration and spurring without significant spinal or foraminal stenosis. C4-5: Mild disc degeneration and uncinate spurring bilaterally without significant stenosis. C5-6: Negative C6-7: Mild disc degeneration and mild spurring. No significant stenosis C7-T1: Negative Upper chest: Mild ground-glass density in the right upper lobe could represent acute or chronic airspace disease. No effusion. Other: None IMPRESSION: No acute abnormality in the cervical spine.  No fracture or mass Mild degenerative changes cervical spine without spinal or foraminal encroachment. 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 Electronically Signed   By: Franchot Gallo M.D.   On: 03/29/2020 12:52   US PELVIC COMPLETE WITH TRANSVAGINAL  Result Date: 03/04/2020 CLINICAL DATA:  Complex ovarian cyst, follow-up; postmenopausal EXAM: TRANSABDOMINAL AND TRANSVAGINAL ULTRASOUND OF PELVIS TECHNIQUE: Both transabdominal and transvaginal ultrasound examinations of the pelvis were performed. Transabdominal technique was performed for global imaging of the pelvis including uterus, ovaries, adnexal regions, and pelvic  cul-de-sac. It was necessary to proceed with endovaginal exam following the transabdominal exam to visualize the ovaries. COMPARISON:  12/11/2019 FINDINGS: Uterus Measurements: 6.3 x 2.3 x 3.5 cm = volume: 27 mL. Anteverted and minimally anteflexed. Normal morphology without mass Endometrium Thickness: 4 mm. Focal hyperechoic nodule identified within mid segment endometrial canal to the LEFT measuring 7 x 5 x 5 mm suspicious for polyp. No endometrial fluid. Right ovary Measurements: 2.2 x 1.3 x 1.0 cm = volume: 1.4 mL. Normal morphology without mass Left ovary Measurements: 3.0 x 1.4 x 1.7 cm = volume: 3.7 mL. Hypoechoic complicated cystic lesion of the LEFT ovary 2.5 x 1.8 x 2.0 cm (previously 2.3 x 1.6 x 1.9 cm), containing a small septation which appears to have internal blood flow on color Doppler imaging. Small mural nodule as well. Findings represents a complicated cystic lesion and malignancy is not excluded with this appearance. Other findings No free pelvic fluid.  No adnexal masses. IMPRESSION: Complicated cystic lesion of the LEFT ovary 2.5 cm greatest size, slightly increased in size since previous study, containing a small septation and a mural nodule with internal blood flow within these portions on color Doppler imaging. Malignancy is not excluded and surgical evaluation is recommended. Persistent visualization of a 7 mm hyperechoic nodule within the LEFT mid uterine segment endometrial complex concerning for polyp; sonohysterogram should be considered for focal lesion work-up prior to hysteroscopy. (Ref: Radiological Reasoning: Algorithmic Workup of Abnormal Vaginal Bleeding with Endovaginal Sonography and Sonohysterography. AJR 2008; 562:Z30-86) These results will be called to the ordering clinician or representative by the Radiologist Assistant, and communication documented in the PACS or [employer]. Electronically Signed   By: Lavonia Dana M.D.   On: 03/04/2020 16:12    Micro Results     Recent Results (from the past 240 hour(s))  SARS Coronavirus 2 by RT PCR (hospital order, performed in [facility] hospital lab) Nasopharyngeal Nasopharyngeal Swab     Status: None   Collection Time: 03/27/20  1:55 PM   Specimen: Nasopharyngeal Swab  Result Value Ref Range Status   SARS Coronavirus 2 NEGATIVE NEGATIVE Final    Comment: (NOTE) SARS-CoV-2 target nucleic acids are NOT DETECTED.  The SARS-CoV-2 RNA is generally detectable in upper and lower respiratory specimens during the acute phase of infection. The lowest concentration of SARS-CoV-2 viral copies this assay can detect is 250 copies / mL. A negative result does not preclude SARS-CoV-2 infection and should not be used as the sole basis for treatment or other  patient management decisions.  A negative result may occur with improper specimen collection / handling, submission of specimen other than nasopharyngeal swab, presence of viral mutation(s) within the areas targeted by this assay, and inadequate number of viral copies (<250 copies / mL). A negative result must be combined with clinical observations, patient history, and epidemiological information.  Fact Sheet for Patients:   StrictlyIdeas.no  Fact Sheet for Healthcare Providers: BankingDealers.co.za  This test is not yet approved or  cleared by the [place] FDA and has been authorized for detection and/or diagnosis of SARS-CoV-2 by FDA under an Emergency Use Authorization (EUA).  This EUA will remain in effect (meaning this test can be used) for the duration of the COVID-19 declaration under Section 564(b)(1) of the Act, 21 U.S.C. section 360bbb-3(b)(1), unless the authorization is terminated or revoked sooner.  Performed at [facility], [address] [place], [place] [address]     Today   Subjective    Alison Huffman continues to feel weak and tired.  Notes she is unable to lift her  arms and is unable to feed herself although she is able to bend her hands at her elbows.  Still unable to walk.  Denies shortness of breath or difficulty with talking.  Objective   Blood pressure (!) 143/79, pulse 80, temperature 97.8 F (36.6 C), temperature source Oral, resp. rate 16, height 5\' 6"  (1.676 m), weight 76.2 kg, SpO2 95 %.   Intake/Output Summary (Last 24 hours) at 04/02/2020 1437 Last data filed at 04/02/2020 1100 Gross per 24 hour  Intake 340 ml  Output --  Net 340 ml    Exam General:  Patient appears weak and tired.  She is able to talk in full sentences although is somewhat breathy. Eyes: sclera anicteric, conjuctiva mild injection bilaterally CVS: S1-S2, regular  Respiratory:  decreased air entry bilaterally secondary to decreased inspiratory effort, rales at bases  GI: NABS, soft, NT  LE: No edema.  Neuro: A/O x 3, decreased reflexes throughout, 2-3/5 flexion extension at knees, ankles elbows and wrists.  Psych: patient is logical and coherent, judgement and insight appear normal, mood and affect appropriate to situation.    Data Review   CBC w Diff:  Lab Results  Component Value Date   WBC 8.5 04/02/2020   HGB 13.2 04/02/2020   HCT 36.6 04/02/2020   PLT 222 04/02/2020   LYMPHOPCT 24 03/25/2020   MONOPCT 5 03/25/2020   EOSPCT 2 03/25/2020   BASOPCT 0 03/25/2020    CMP:  Lab Results  Component Value Date   NA 119 (LL) 04/02/2020   K 3.8 04/02/2020   CL 89 (L) 04/02/2020   CO2 22 04/02/2020   BUN 12 04/02/2020   CREATININE 0.43 (L) 04/02/2020   PROT 8.8 (H) 04/02/2020   ALBUMIN 2.9 (L) 04/02/2020   BILITOT 0.7 04/02/2020   ALKPHOS 64 04/02/2020   AST 54 (H) 04/02/2020   ALT 41 04/02/2020  .   Total Time in preparing paper work, data evaluation and todays exam - 35 minutes  Vashti Hey M.D on 04/02/2020 at 2:37 PM  [facility]   Office  [phone]

## 2020-04-03 ENCOUNTER — Encounter (HOSPITAL_COMMUNITY): Payer: Self-pay | Admitting: Certified Registered"

## 2020-04-03 ENCOUNTER — Inpatient Hospital Stay (HOSPITAL_COMMUNITY): Payer: Medicare Other

## 2020-04-03 ENCOUNTER — Inpatient Hospital Stay (HOSPITAL_COMMUNITY): Payer: Medicare Other | Admitting: Certified Registered"

## 2020-04-03 ENCOUNTER — Inpatient Hospital Stay (HOSPITAL_COMMUNITY)
Admission: AD | Admit: 2020-04-03 | Discharge: 2020-04-18 | DRG: 207 | Disposition: A | Discharge status: Rehabilitation Facility | Payer: Medicare Other | Source: Other Acute Inpatient Hospital | Attending: Internal Medicine | Admitting: Internal Medicine

## 2020-04-03 DIAGNOSIS — R4189 Other symptoms and signs involving cognitive functions and awareness: Secondary | ICD-10-CM | POA: Diagnosis present

## 2020-04-03 DIAGNOSIS — E871 Hypo-osmolality and hyponatremia: Secondary | ICD-10-CM | POA: Diagnosis present

## 2020-04-03 DIAGNOSIS — J95812 Postprocedural air leak: Secondary | ICD-10-CM | POA: Diagnosis not present

## 2020-04-03 DIAGNOSIS — M5124 Other intervertebral disc displacement, thoracic region: Secondary | ICD-10-CM | POA: Diagnosis not present

## 2020-04-03 DIAGNOSIS — Z9689 Presence of other specified functional implants: Secondary | ICD-10-CM

## 2020-04-03 DIAGNOSIS — D72829 Elevated white blood cell count, unspecified: Secondary | ICD-10-CM | POA: Diagnosis not present

## 2020-04-03 DIAGNOSIS — K760 Fatty (change of) liver, not elsewhere classified: Secondary | ICD-10-CM | POA: Diagnosis present

## 2020-04-03 DIAGNOSIS — R739 Hyperglycemia, unspecified: Secondary | ICD-10-CM | POA: Diagnosis present

## 2020-04-03 DIAGNOSIS — D649 Anemia, unspecified: Secondary | ICD-10-CM | POA: Diagnosis not present

## 2020-04-03 DIAGNOSIS — R54 Age-related physical debility: Secondary | ICD-10-CM | POA: Diagnosis present

## 2020-04-03 DIAGNOSIS — K219 Gastro-esophageal reflux disease without esophagitis: Secondary | ICD-10-CM | POA: Diagnosis present

## 2020-04-03 DIAGNOSIS — Z9911 Dependence on respirator [ventilator] status: Secondary | ICD-10-CM | POA: Diagnosis not present

## 2020-04-03 DIAGNOSIS — J392 Other diseases of pharynx: Secondary | ICD-10-CM | POA: Diagnosis not present

## 2020-04-03 DIAGNOSIS — L4050 Arthropathic psoriasis, unspecified: Secondary | ICD-10-CM | POA: Diagnosis present

## 2020-04-03 DIAGNOSIS — Z4659 Encounter for fitting and adjustment of other gastrointestinal appliance and device: Secondary | ICD-10-CM

## 2020-04-03 DIAGNOSIS — G610 Guillain-Barre syndrome: Secondary | ICD-10-CM | POA: Diagnosis not present

## 2020-04-03 DIAGNOSIS — B9561 Methicillin susceptible Staphylococcus aureus infection as the cause of diseases classified elsewhere: Secondary | ICD-10-CM | POA: Diagnosis present

## 2020-04-03 DIAGNOSIS — I441 Atrioventricular block, second degree: Secondary | ICD-10-CM | POA: Diagnosis present

## 2020-04-03 DIAGNOSIS — L89152 Pressure ulcer of sacral region, stage 2: Secondary | ICD-10-CM | POA: Diagnosis present

## 2020-04-03 DIAGNOSIS — R569 Unspecified convulsions: Secondary | ICD-10-CM | POA: Diagnosis present

## 2020-04-03 DIAGNOSIS — R579 Shock, unspecified: Secondary | ICD-10-CM | POA: Diagnosis not present

## 2020-04-03 DIAGNOSIS — J3489 Other specified disorders of nose and nasal sinuses: Secondary | ICD-10-CM | POA: Diagnosis not present

## 2020-04-03 DIAGNOSIS — R0682 Tachypnea, not elsewhere classified: Secondary | ICD-10-CM

## 2020-04-03 DIAGNOSIS — J9622 Acute and chronic respiratory failure with hypercapnia: Secondary | ICD-10-CM | POA: Diagnosis present

## 2020-04-03 DIAGNOSIS — R531 Weakness: Secondary | ICD-10-CM

## 2020-04-03 DIAGNOSIS — E876 Hypokalemia: Secondary | ICD-10-CM | POA: Diagnosis present

## 2020-04-03 DIAGNOSIS — Z87891 Personal history of nicotine dependence: Secondary | ICD-10-CM | POA: Diagnosis not present

## 2020-04-03 DIAGNOSIS — I5032 Chronic diastolic (congestive) heart failure: Secondary | ICD-10-CM

## 2020-04-03 DIAGNOSIS — J181 Lobar pneumonia, unspecified organism: Secondary | ICD-10-CM | POA: Diagnosis not present

## 2020-04-03 DIAGNOSIS — Z20822 Contact with and (suspected) exposure to covid-19: Secondary | ICD-10-CM | POA: Diagnosis not present

## 2020-04-03 DIAGNOSIS — I2510 Atherosclerotic heart disease of native coronary artery without angina pectoris: Secondary | ICD-10-CM

## 2020-04-03 DIAGNOSIS — J15211 Pneumonia due to Methicillin susceptible Staphylococcus aureus: Secondary | ICD-10-CM | POA: Diagnosis not present

## 2020-04-03 DIAGNOSIS — E870 Hyperosmolality and hypernatremia: Secondary | ICD-10-CM | POA: Diagnosis not present

## 2020-04-03 DIAGNOSIS — Z950 Presence of cardiac pacemaker: Secondary | ICD-10-CM

## 2020-04-03 DIAGNOSIS — I10 Essential (primary) hypertension: Secondary | ICD-10-CM

## 2020-04-03 DIAGNOSIS — R748 Abnormal levels of other serum enzymes: Secondary | ICD-10-CM | POA: Diagnosis not present

## 2020-04-03 DIAGNOSIS — Z91048 Other nonmedicinal substance allergy status: Secondary | ICD-10-CM

## 2020-04-03 DIAGNOSIS — R1312 Dysphagia, oropharyngeal phase: Secondary | ICD-10-CM | POA: Diagnosis not present

## 2020-04-03 DIAGNOSIS — M7522 Bicipital tendinitis, left shoulder: Secondary | ICD-10-CM | POA: Diagnosis present

## 2020-04-03 DIAGNOSIS — R471 Dysarthria and anarthria: Secondary | ICD-10-CM | POA: Diagnosis present

## 2020-04-03 DIAGNOSIS — M47814 Spondylosis without myelopathy or radiculopathy, thoracic region: Secondary | ICD-10-CM | POA: Diagnosis not present

## 2020-04-03 DIAGNOSIS — J939 Pneumothorax, unspecified: Secondary | ICD-10-CM

## 2020-04-03 DIAGNOSIS — J95811 Postprocedural pneumothorax: Secondary | ICD-10-CM | POA: Diagnosis not present

## 2020-04-03 DIAGNOSIS — Z452 Encounter for adjustment and management of vascular access device: Secondary | ICD-10-CM

## 2020-04-03 DIAGNOSIS — E440 Moderate protein-calorie malnutrition: Secondary | ICD-10-CM | POA: Diagnosis not present

## 2020-04-03 DIAGNOSIS — R339 Retention of urine, unspecified: Secondary | ICD-10-CM | POA: Diagnosis not present

## 2020-04-03 DIAGNOSIS — J9621 Acute and chronic respiratory failure with hypoxia: Principal | ICD-10-CM

## 2020-04-03 DIAGNOSIS — M1990 Unspecified osteoarthritis, unspecified site: Secondary | ICD-10-CM | POA: Diagnosis present

## 2020-04-03 DIAGNOSIS — E785 Hyperlipidemia, unspecified: Secondary | ICD-10-CM | POA: Diagnosis present

## 2020-04-03 DIAGNOSIS — N319 Neuromuscular dysfunction of bladder, unspecified: Secondary | ICD-10-CM | POA: Diagnosis present

## 2020-04-03 DIAGNOSIS — J9600 Acute respiratory failure, unspecified whether with hypoxia or hypercapnia: Secondary | ICD-10-CM | POA: Diagnosis not present

## 2020-04-03 DIAGNOSIS — J9601 Acute respiratory failure with hypoxia: Secondary | ICD-10-CM | POA: Diagnosis not present

## 2020-04-03 DIAGNOSIS — L8992 Pressure ulcer of unspecified site, stage 2: Secondary | ICD-10-CM | POA: Insufficient documentation

## 2020-04-03 DIAGNOSIS — J9602 Acute respiratory failure with hypercapnia: Secondary | ICD-10-CM | POA: Diagnosis not present

## 2020-04-03 DIAGNOSIS — H7093 Unspecified mastoiditis, bilateral: Secondary | ICD-10-CM | POA: Diagnosis not present

## 2020-04-03 DIAGNOSIS — R410 Disorientation, unspecified: Secondary | ICD-10-CM | POA: Diagnosis not present

## 2020-04-03 DIAGNOSIS — T17990A Other foreign object in respiratory tract, part unspecified in causing asphyxiation, initial encounter: Secondary | ICD-10-CM | POA: Diagnosis not present

## 2020-04-03 DIAGNOSIS — I6782 Cerebral ischemia: Secondary | ICD-10-CM | POA: Diagnosis not present

## 2020-04-03 DIAGNOSIS — R918 Other nonspecific abnormal finding of lung field: Secondary | ICD-10-CM | POA: Diagnosis not present

## 2020-04-03 DIAGNOSIS — I110 Hypertensive heart disease with heart failure: Secondary | ICD-10-CM | POA: Diagnosis present

## 2020-04-03 DIAGNOSIS — J9690 Respiratory failure, unspecified, unspecified whether with hypoxia or hypercapnia: Secondary | ICD-10-CM

## 2020-04-03 DIAGNOSIS — G8250 Quadriplegia, unspecified: Secondary | ICD-10-CM | POA: Diagnosis present

## 2020-04-03 DIAGNOSIS — R159 Full incontinence of feces: Secondary | ICD-10-CM | POA: Diagnosis not present

## 2020-04-03 DIAGNOSIS — R0602 Shortness of breath: Secondary | ICD-10-CM | POA: Diagnosis not present

## 2020-04-03 DIAGNOSIS — D1809 Hemangioma of other sites: Secondary | ICD-10-CM | POA: Diagnosis not present

## 2020-04-03 DIAGNOSIS — K5900 Constipation, unspecified: Secondary | ICD-10-CM | POA: Diagnosis present

## 2020-04-03 DIAGNOSIS — L409 Psoriasis, unspecified: Secondary | ICD-10-CM | POA: Diagnosis present

## 2020-04-03 DIAGNOSIS — J9811 Atelectasis: Secondary | ICD-10-CM | POA: Diagnosis not present

## 2020-04-03 DIAGNOSIS — K7689 Other specified diseases of liver: Secondary | ICD-10-CM | POA: Diagnosis not present

## 2020-04-03 DIAGNOSIS — M7521 Bicipital tendinitis, right shoulder: Secondary | ICD-10-CM | POA: Diagnosis present

## 2020-04-03 DIAGNOSIS — Z4682 Encounter for fitting and adjustment of non-vascular catheter: Secondary | ICD-10-CM

## 2020-04-03 DIAGNOSIS — G4733 Obstructive sleep apnea (adult) (pediatric): Secondary | ICD-10-CM | POA: Diagnosis present

## 2020-04-03 DIAGNOSIS — J90 Pleural effusion, not elsewhere classified: Secondary | ICD-10-CM | POA: Diagnosis not present

## 2020-04-03 DIAGNOSIS — Z803 Family history of malignant neoplasm of breast: Secondary | ICD-10-CM

## 2020-04-03 DIAGNOSIS — T394X5A Adverse effect of antirheumatics, not elsewhere classified, initial encounter: Secondary | ICD-10-CM | POA: Diagnosis present

## 2020-04-03 DIAGNOSIS — M4312 Spondylolisthesis, cervical region: Secondary | ICD-10-CM | POA: Diagnosis not present

## 2020-04-03 DIAGNOSIS — M50121 Cervical disc disorder at C4-C5 level with radiculopathy: Secondary | ICD-10-CM | POA: Diagnosis not present

## 2020-04-03 DIAGNOSIS — M4723 Other spondylosis with radiculopathy, cervicothoracic region: Secondary | ICD-10-CM | POA: Diagnosis not present

## 2020-04-03 DIAGNOSIS — D62 Acute posthemorrhagic anemia: Secondary | ICD-10-CM | POA: Diagnosis present

## 2020-04-03 DIAGNOSIS — I9581 Postprocedural hypotension: Secondary | ICD-10-CM | POA: Diagnosis present

## 2020-04-03 DIAGNOSIS — Z823 Family history of stroke: Secondary | ICD-10-CM

## 2020-04-03 DIAGNOSIS — B370 Candidal stomatitis: Secondary | ICD-10-CM | POA: Diagnosis present

## 2020-04-03 DIAGNOSIS — J322 Chronic ethmoidal sinusitis: Secondary | ICD-10-CM | POA: Diagnosis not present

## 2020-04-03 LAB — BASIC METABOLIC PANEL
Anion gap: 11 (ref 5–15)
Anion gap: 6 (ref 5–15)
Anion gap: 6 (ref 5–15)
BUN: 12 mg/dL (ref 8–23)
BUN: 13 mg/dL (ref 8–23)
BUN: 17 mg/dL (ref 8–23)
CO2: 19 mmol/L — ABNORMAL LOW (ref 22–32)
CO2: 22 mmol/L (ref 22–32)
CO2: 21 mmol/L — ABNORMAL LOW (ref 22–32)
Calcium: 8.3 mg/dL — ABNORMAL LOW (ref 8.9–10.3)
Calcium: 8.2 mg/dL — ABNORMAL LOW (ref 8.9–10.3)
Calcium: 8.2 mg/dL — ABNORMAL LOW (ref 8.9–10.3)
Chloride: 89 mmol/L — ABNORMAL LOW (ref 98–111)
Chloride: 100 mmol/L (ref 98–111)
Chloride: 102 mmol/L (ref 98–111)
Creatinine, Ser: 0.61 mg/dL (ref 0.44–1.00)
Creatinine, Ser: 0.54 mg/dL (ref 0.44–1.00)
Creatinine, Ser: 0.52 mg/dL (ref 0.44–1.00)
GFR calc Af Amer: >60 mL/min (ref >60)
GFR calc Af Amer: >60 mL/min (ref >60)
GFR calc Af Amer: >60 mL/min (ref >60)
GFR calc non Af Amer: >60 mL/min (ref >60)
GFR calc non Af Amer: >60 mL/min (ref >60)
GFR calc non Af Amer: >60 mL/min (ref >60)
Glucose, Bld: 244 mg/dL — ABNORMAL HIGH (ref 70–99)
Glucose, Bld: 134 mg/dL — ABNORMAL HIGH (ref 70–99)
Glucose, Bld: 131 mg/dL — ABNORMAL HIGH (ref 70–99)
Potassium: 4.6 mmol/L (ref 3.5–5.1)
Potassium: 4.2 mmol/L (ref 3.5–5.1)
Potassium: 4.2 mmol/L (ref 3.5–5.1)
Sodium: 119 mmol/L — CL (ref 135–145)
Sodium: 128 mmol/L — ABNORMAL LOW (ref 135–145)
Sodium: 129 mmol/L — ABNORMAL LOW (ref 135–145)

## 2020-04-03 LAB — CBC WITH DIFFERENTIAL/PLATELET
Abs Immature Granulocytes: 0.05 10*3/uL (ref 0.00–0.07)
Basophils Absolute: 0 10*3/uL (ref 0.0–0.1)
Basophils Relative: 0 %
Eosinophils Absolute: 0 10*3/uL (ref 0.0–0.5)
Eosinophils Relative: 0 %
HCT: 44.9 % (ref 36.0–46.0)
Hemoglobin: 14.6 g/dL (ref 12.0–15.0)
Immature Granulocytes: 0 %
Lymphocytes Relative: 11 %
Lymphs Abs: 1.7 10*3/uL (ref 0.7–4.0)
MCH: 29.7 pg (ref 26.0–34.0)
MCHC: 32.5 g/dL (ref 30.0–36.0)
MCV: 91.3 fL (ref 80.0–100.0)
Monocytes Absolute: 0.9 10*3/uL (ref 0.1–1.0)
Monocytes Relative: 6 %
Neutro Abs: 12.8 10*3/uL — ABNORMAL HIGH (ref 1.7–7.7)
Neutrophils Relative %: 83 %
Platelets: 237 10*3/uL (ref 150–400)
RBC: 4.92 MIL/uL (ref 3.87–5.11)
RDW: 12.5 % (ref 11.5–15.5)
WBC: 15.5 10*3/uL — ABNORMAL HIGH (ref 4.0–10.5)
nRBC: 0 % (ref 0.0–0.2)

## 2020-04-03 LAB — CORTISOL: Cortisol, Plasma: 63.2 ug/dL

## 2020-04-03 LAB — HEMOGLOBIN A1C
Hgb A1c MFr Bld: 6 % — ABNORMAL HIGH (ref 4.8–5.6)
Mean Plasma Glucose: 125.5 mg/dL

## 2020-04-03 LAB — HEPATIC FUNCTION PANEL
ALT: 63 U/L — ABNORMAL HIGH (ref 0–44)
AST: 82 U/L — ABNORMAL HIGH (ref 15–41)
Albumin: 3 g/dL — ABNORMAL LOW (ref 3.5–5.0)
Alkaline Phosphatase: 85 U/L (ref 38–126)
Bilirubin, Direct: 0.4 mg/dL — ABNORMAL HIGH (ref 0.0–0.2)
Indirect Bilirubin: 0.9 mg/dL (ref 0.3–0.9)
Total Bilirubin: 1.3 mg/dL — ABNORMAL HIGH (ref 0.3–1.2)
Total Protein: 9 g/dL — ABNORMAL HIGH (ref 6.5–8.1)

## 2020-04-03 LAB — MAGNESIUM
Magnesium: 2.2 mg/dL (ref 1.7–2.4)
Magnesium: 2.4 mg/dL (ref 1.7–2.4)

## 2020-04-03 LAB — POCT I-STAT 7, (LYTES, BLD GAS, ICA,H+H)
Acid-base deficit: 4 mmol/L — ABNORMAL HIGH (ref 0.0–2.0)
Bicarbonate: 20.4 mmol/L (ref 20.0–28.0)
Calcium, Ion: 1.13 mmol/L — ABNORMAL LOW (ref 1.15–1.40)
HCT: 35 % — ABNORMAL LOW (ref 36.0–46.0)
Hemoglobin: 11.9 g/dL — ABNORMAL LOW (ref 12.0–15.0)
O2 Saturation: 100 %
Patient temperature: 98.5
Potassium: 3.4 mmol/L — ABNORMAL LOW (ref 3.5–5.1)
Sodium: 126 mmol/L — ABNORMAL LOW (ref 135–145)
TCO2: 21 mmol/L — ABNORMAL LOW (ref 22–32)
pCO2 arterial: 34.9 mmHg (ref 32.0–48.0)
pH, Arterial: 7.374 (ref 7.350–7.450)
pO2, Arterial: 236 mmHg — ABNORMAL HIGH (ref 83.0–108.0)

## 2020-04-03 LAB — TSH: TSH: 1.162 u[IU]/mL (ref 0.350–4.500)

## 2020-04-03 LAB — TYPE AND SCREEN
ABO/RH(D): O POS
Antibody Screen: NEGATIVE

## 2020-04-03 LAB — PHOSPHORUS
Phosphorus: 4.9 mg/dL — ABNORMAL HIGH (ref 2.5–4.6)
Phosphorus: 3 mg/dL (ref 2.5–4.6)

## 2020-04-03 LAB — GLUCOSE, CAPILLARY
Glucose-Capillary: 106 mg/dL — ABNORMAL HIGH (ref 70–99)
Glucose-Capillary: 110 mg/dL — ABNORMAL HIGH (ref 70–99)
Glucose-Capillary: 116 mg/dL — ABNORMAL HIGH (ref 70–99)
Glucose-Capillary: 128 mg/dL — ABNORMAL HIGH (ref 70–99)
Glucose-Capillary: 148 mg/dL — ABNORMAL HIGH (ref 70–99)
Glucose-Capillary: 203 mg/dL — ABNORMAL HIGH (ref 70–99)
Glucose-Capillary: 246 mg/dL — ABNORMAL HIGH (ref 70–99)

## 2020-04-03 LAB — ABO/RH: ABO/RH(D): O POS

## 2020-04-03 MED ORDER — VITAL HIGH PROTEIN PO LIQD: 1000.0000 mL | ORAL | Status: DC

## 2020-04-03 MED ORDER — DOCUSATE SODIUM 50 MG/5ML PO LIQD
100.0000 mg | Freq: Two times a day (BID) | ORAL | Status: DC
  Filled 2020-04-03: qty 10

## 2020-04-03 MED ORDER — FENTANYL CITRATE (PF) 100 MCG/2ML IJ SOLN
INTRAMUSCULAR | Status: AC
  Filled 2020-04-03: qty 2

## 2020-04-03 MED ORDER — PRO-STAT SUGAR FREE PO LIQD
30.0000 mL | Freq: Every day | ORAL | Status: DC
  Administered 2020-04-03 – 2020-04-09 (×7): 30 mL
  Filled 2020-04-03 (×7): qty 30

## 2020-04-03 MED ORDER — SUCCINYLCHOLINE CHLORIDE 20 MG/ML IJ SOLN
INTRAMUSCULAR | Status: DC | PRN
  Administered 2020-04-03: 100 mg via INTRAVENOUS

## 2020-04-03 MED ORDER — SODIUM CHLORIDE 0.9 % IV SOLN
2000.0000 mg | Freq: Once | INTRAVENOUS | Status: AC
  Administered 2020-04-03: 2000 mg via INTRAVENOUS
  Filled 2020-04-03: qty 20

## 2020-04-03 MED ORDER — LEVETIRACETAM IN NACL 500 MG/100ML IV SOLN
500.0000 mg | Freq: Two times a day (BID) | INTRAVENOUS | Status: DC
  Administered 2020-04-03 – 2020-04-08 (×11): 500 mg via INTRAVENOUS
  Filled 2020-04-03 (×11): qty 100

## 2020-04-03 MED ORDER — PANTOPRAZOLE SODIUM 40 MG PO PACK
40.0000 mg | PACK | Freq: Every day | ORAL | Status: DC
  Administered 2020-04-03 – 2020-04-18 (×16): 40 mg
  Filled 2020-04-03 (×16): qty 20

## 2020-04-03 MED ORDER — MIDAZOLAM HCL 2 MG/2ML IJ SOLN
INTRAMUSCULAR | Status: AC
  Filled 2020-04-03: qty 2

## 2020-04-03 MED ORDER — CHLORHEXIDINE GLUCONATE 0.12% ORAL RINSE (MEDLINE KIT)
15.0000 mL | Freq: Two times a day (BID) | OROMUCOSAL | Status: DC
  Administered 2020-04-03: 15 mL via OROMUCOSAL

## 2020-04-03 MED ORDER — PHENYLEPHRINE HCL-NACL 10-0.9 MG/250ML-% IV SOLN
0.0000 ug/min | INTRAVENOUS | Status: DC
  Administered 2020-04-03 (×2): 50 ug/min via INTRAVENOUS
  Administered 2020-04-03: 24 ug/min via INTRAVENOUS
  Administered 2020-04-03: 16 ug/min via INTRAVENOUS
  Filled 2020-04-03 (×3): qty 250

## 2020-04-03 MED ORDER — DEXMEDETOMIDINE HCL IN NACL 400 MCG/100ML IV SOLN
0.0000 ug/kg/h | INTRAVENOUS | Status: DC
  Administered 2020-04-03: 0.9 ug/kg/h via INTRAVENOUS
  Administered 2020-04-03: 0.5 ug/kg/h via INTRAVENOUS
  Administered 2020-04-03: 0.4 ug/kg/h via INTRAVENOUS
  Administered 2020-04-04: 0.8 ug/kg/h via INTRAVENOUS
  Administered 2020-04-04: 1 ug/kg/h via INTRAVENOUS
  Administered 2020-04-04: 0.9 ug/kg/h via INTRAVENOUS
  Administered 2020-04-04: 1.1 ug/kg/h via INTRAVENOUS
  Administered 2020-04-05 (×4): 0.8 ug/kg/h via INTRAVENOUS
  Filled 2020-04-03 (×11): qty 100

## 2020-04-03 MED ORDER — SODIUM CHLORIDE 0.9 % IV BOLUS
1000.0000 mL | Freq: Once | INTRAVENOUS | Status: AC
  Administered 2020-04-03: 1000 mL via INTRAVENOUS

## 2020-04-03 MED ORDER — ORAL CARE MOUTH RINSE
15.0000 mL | OROMUCOSAL | Status: DC
  Administered 2020-04-03: 15 mL via OROMUCOSAL

## 2020-04-03 MED ORDER — MIDAZOLAM HCL 2 MG/2ML IJ SOLN
1.0000 mg | INTRAMUSCULAR | Status: DC | PRN
  Administered 2020-04-04 – 2020-04-07 (×3): 1 mg via INTRAVENOUS
  Filled 2020-04-03 (×5): qty 2

## 2020-04-03 MED ORDER — CHLORHEXIDINE GLUCONATE CLOTH 2 % EX PADS
6.0000 | MEDICATED_PAD | Freq: Every day | CUTANEOUS | Status: DC
  Administered 2020-04-03 – 2020-04-18 (×16): 6 via TOPICAL

## 2020-04-03 MED ORDER — PHENYLEPHRINE 40 MCG/ML (10ML) SYRINGE FOR IV PUSH (FOR BLOOD PRESSURE SUPPORT)
PREFILLED_SYRINGE | INTRAVENOUS | Status: DC | PRN
  Administered 2020-04-03 (×2): 120 ug via INTRAVENOUS
  Administered 2020-04-03: 200 ug via INTRAVENOUS
  Administered 2020-04-03: 120 ug via INTRAVENOUS

## 2020-04-03 MED ORDER — FENTANYL CITRATE (PF) 100 MCG/2ML IJ SOLN
25.0000 ug | INTRAMUSCULAR | Status: DC | PRN
  Administered 2020-04-05: 100 ug via INTRAVENOUS
  Administered 2020-04-06 (×3): 50 ug via INTRAVENOUS
  Administered 2020-04-06: 100 ug via INTRAVENOUS
  Administered 2020-04-06 – 2020-04-07 (×4): 50 ug via INTRAVENOUS
  Administered 2020-04-08: 25 ug via INTRAVENOUS
  Filled 2020-04-03 (×8): qty 2

## 2020-04-03 MED ORDER — SODIUM CHLORIDE 0.9 % IV SOLN: INTRAVENOUS | Status: DC

## 2020-04-03 MED ORDER — INSULIN ASPART 100 UNIT/ML ~~LOC~~ SOLN
0.0000 [IU] | SUBCUTANEOUS | Status: DC
  Administered 2020-04-03: 2 [IU] via SUBCUTANEOUS
  Administered 2020-04-03: 5 [IU] via SUBCUTANEOUS
  Administered 2020-04-03 – 2020-04-08 (×20): 2 [IU] via SUBCUTANEOUS
  Administered 2020-04-09 (×2): 3 [IU] via SUBCUTANEOUS
  Administered 2020-04-09 – 2020-04-12 (×11): 2 [IU] via SUBCUTANEOUS
  Administered 2020-04-13: 3 [IU] via SUBCUTANEOUS
  Administered 2020-04-13 (×2): 2 [IU] via SUBCUTANEOUS
  Administered 2020-04-13: 5 [IU] via SUBCUTANEOUS
  Administered 2020-04-13: 3 [IU] via SUBCUTANEOUS
  Administered 2020-04-14: 2 [IU] via SUBCUTANEOUS
  Administered 2020-04-14 (×2): 3 [IU] via SUBCUTANEOUS
  Administered 2020-04-15 – 2020-04-18 (×10): 2 [IU] via SUBCUTANEOUS

## 2020-04-03 MED ORDER — POLYETHYLENE GLYCOL 3350 17 G PO PACK
17.0000 g | PACK | Freq: Every day | ORAL | Status: DC
  Administered 2020-04-04 – 2020-04-17 (×13): 17 g via ORAL
  Filled 2020-04-03 (×13): qty 1

## 2020-04-03 MED ORDER — ACETAMINOPHEN 325 MG PO TABS
650.0000 mg | ORAL_TABLET | Freq: Four times a day (QID) | ORAL | Status: DC | PRN
  Administered 2020-04-09 – 2020-04-10 (×2): 650 mg via ORAL

## 2020-04-03 MED ORDER — PROPOFOL 10 MG/ML IV BOLUS
INTRAVENOUS | Status: DC | PRN
  Administered 2020-04-03: 100 mg via INTRAVENOUS

## 2020-04-03 MED ORDER — PHENYLEPHRINE HCL-NACL 10-0.9 MG/250ML-% IV SOLN
INTRAVENOUS | Status: AC
  Administered 2020-04-03: 20 mg
  Filled 2020-04-03: qty 250

## 2020-04-03 MED ORDER — ORAL CARE MOUTH RINSE
15.0000 mL | OROMUCOSAL | Status: DC
  Administered 2020-04-03 – 2020-04-11 (×78): 15 mL via OROMUCOSAL

## 2020-04-03 MED ORDER — LORAZEPAM 2 MG/ML IJ SOLN
INTRAMUSCULAR | Status: AC
  Filled 2020-04-03: qty 1

## 2020-04-03 MED ORDER — ENOXAPARIN SODIUM 40 MG/0.4ML ~~LOC~~ SOLN
40.0000 mg | Freq: Every day | SUBCUTANEOUS | Status: DC
  Administered 2020-04-03 – 2020-04-18 (×16): 40 mg via SUBCUTANEOUS
  Filled 2020-04-03 (×16): qty 0.4

## 2020-04-03 MED ORDER — VITAL AF 1.2 CAL PO LIQD
1000.0000 mL | ORAL | Status: DC
  Administered 2020-04-03 – 2020-04-08 (×5): 1000 mL

## 2020-04-03 MED ORDER — PRO-STAT SUGAR FREE PO LIQD: 30.0000 mL | Freq: Two times a day (BID) | ORAL | Status: DC

## 2020-04-03 MED ORDER — CHLORHEXIDINE GLUCONATE 0.12% ORAL RINSE (MEDLINE KIT)
15.0000 mL | Freq: Two times a day (BID) | OROMUCOSAL | Status: DC
  Administered 2020-04-03 – 2020-04-11 (×16): 15 mL via OROMUCOSAL

## 2020-04-03 MED ORDER — DOCUSATE SODIUM 50 MG/5ML PO LIQD
100.0000 mg | Freq: Two times a day (BID) | ORAL | Status: DC
  Administered 2020-04-03 – 2020-04-17 (×24): 100 mg
  Filled 2020-04-03 (×24): qty 10

## 2020-04-03 NOTE — H&P (Signed)
NAME:  Alison Huffman, MRN:  [id number], DOB:  [birth year], LOS: 0 ADMISSION DATE:  04/03/2020, CONSULTATION DATE:  04/03/2020 REFERRING MD:  Dr. Hal Hope, [facility], CHIEF COMPLAINT:  Respiratory failure   Brief History   79 yo female former smoker transferred to [facility] from [facility] on 6/26 with weakness, numbness, and tingling.  She was recently started on enbrel for psoriasis.  Also developed progressive hyponatremia.  Transferred to [facility] on 7/03 for further neurologic assessment.  Required intubation for airway protection with concern for seizure and transferred to ICU.  Hx from chart and medical staff.  Past Medical History  CAD, s/p PM, GERD, HTN, HLD, Psoriasis, Arthritis, OSA  Significant Hospital Events   6/26 Admit to [facility] and seen by neurology 6/28 start IVIG 7/02 seen by nephrology at [facility] >> d/c IVIG due to concern about this causing dilutional hyponatremia 7/03 transfer from [facility]; possible seizure, start keppra, intubated, transferred to ICU  Consults:  Nephrology [facility]) Neurology  Procedures:  ETT 7/03 >>   Significant Diagnostic Tests:   CT angio head/neck 6/26 >> 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    Vent Mode: PRVC FiO2 (%):  [100 %] 100 % Set Rate:  [16 bmp-18 bmp] 18 bmp Vt Set:  [470 mL] 470 mL PEEP:  [5 cmH20] 5 cmH20 Plateau Pressure:  [15 cmH20] 15 cmH20  No intake or output data in the 24 hours ending 04/03/20 0328 There were no vitals filed for this visit.  Examination:  General - just intubated Eyes - pupils  reactive ENT - ETT in place Cardiac - regular rate/rhythm, no murmur Chest - scattered rhonchi Abdomen - soft, non tender, + bowel sounds Extremities - no cyanosis, clubbing, or edema Skin - no rashes Neuro - sedated/paralyzed after intubation   Resolved Hospital Problem list     Assessment & Plan:   Acute hypoxic respiratory failure with compromised airway 2nd to seizure in setting of hyponatremia and progressive weakness. Hx of OSA. - full vent support - f/u CXR, ABG  Numbness/weakness progressing from lower extremities up. - no improvement with IVIG - MRI brain, C/T/L spine  Seizure in setting of hyponatremia. - started on keppra - normal saline IV fluid at 75 ml/hr - BMET q4h and adjust IV fluids to avoid too rapid a correction in sodium  Hyperglycemia. - SSI  Hypotension after intubation. - likely from sedation - NS fluid bolus 1 liter  Hx of HTN, HLD. - hold outpt crestor, diovan  Hx of psoriasis. - hold outpt enbrel  Best practice:  Diet: tube feeds DVT prophylaxis: lovenox GI prophylaxis: protonix Mobility: bed rest Code Status: full code Disposition: ICU  Labs   CBC: Recent Labs  Lab 03/30/20 0324 03/31/20 0415 04/01/20 0443 04/02/20 0609 04/03/20 0230  WBC 6.5 6.1 5.3 8.5 15.5*  NEUTROABS  --   --   --   --  12.8*  HGB 13.1 12.6 12.3 13.2 14.6  HCT 38.4 37.7 35.9* 36.6 44.9  MCV 89.5 90.0 88.6 85.5 91.3  PLT 215 215 209  222 803    Basic Metabolic Panel: Recent Labs  Lab 03/29/20 0527 03/30/20 0324 03/31/20 0415 04/01/20 0443 04/02/20 0609  NA 133* 129* 124* 125* 119*  K 3.9 3.8 3.8 3.7 3.8  CL 101 98 94* 95* 89*  CO2 26 27 25 25 22   GLUCOSE 105* 108* 109* 102* 104*  BUN 12 12 11 13 12   CREATININE 0.57 0.44 0.55 0.47 0.43*  CALCIUM 8.7* 8.1* 7.9* 8.1* 8.0*  MG  --   --   --   --  2.1   GFR: Estimated Creatinine Clearance: 58.5 mL/min (A) (by C-G formula based on SCr of 0.43 mg/dL (L)). Recent Labs  Lab 03/31/20 0415  04/01/20 0443 04/02/20 0609 04/03/20 0230  WBC 6.1 5.3 8.5 15.5*    Liver Function Tests: Recent Labs  Lab 04/02/20 0609  AST 54*  ALT 41  ALKPHOS 64  BILITOT 0.7  PROT 8.8*  ALBUMIN 2.9*   No results for input(s): LIPASE, AMYLASE in the last 168 hours. No results for input(s): AMMONIA in the last 168 hours.  ABG No results found for: PHART, PCO2ART, PO2ART, HCO3, TCO2, ACIDBASEDEF, O2SAT   Coagulation Profile: No results for input(s): INR, PROTIME in the last 168 hours.  Cardiac Enzymes: No results for input(s): CKTOTAL, CKMB, CKMBINDEX, TROPONINI in the last 168 hours.  HbA1C: Hemoglobin A1C  Date/Time Value Ref Range Status  12/18/2017 09:03 AM 5.6  Final   Hgb A1c MFr Bld  Date/Time Value Ref Range Status  03/24/2020 02:38 PM 6.1 4.6 - 6.5 % Final    Comment:    Glycemic Control Guidelines for People with Diabetes:Non Diabetic:  <6%Goal of Therapy: <7%Additional Action Suggested:  >8%   09/19/2017 09:35 AM 6.1 4.6 - 6.5 % Final    Comment:    Glycemic Control Guidelines for People with Diabetes:Non Diabetic:  <6%Goal of Therapy: <7%Additional Action Suggested:  >8%     CBG: Recent Labs  Lab 04/03/20 0205 04/03/20 0313  GLUCAP 246* 203*    Review of Systems:   Unable to obtain  Past Medical History  She,  has a past medical history of Ankle pain, right, Atypical chest pain, Coronary artery disease, Diastolic dysfunction, GERD (gastroesophageal reflux disease), Headache, Hyperlipidemia, Hypertension, 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   Surgical History    Past Surgical History:  Procedure Laterality Date  . APPENDECTOMY    .  BREAST CYST ASPIRATION Bilateral   . BREAST CYST EXCISION Right 1978   benign  . COLONOSCOPY    . COLONOSCOPY WITH PROPOFOL N/A 07/10/2019   Procedure: COLONOSCOPY WITH PROPOFOL;  Surgeon: Lollie Sails, MD;  Location: [facility] ENDOSCOPY;  Service: Endoscopy;  Laterality: N/A;  . ESOPHAGOGASTRODUODENOSCOPY (EGD) WITH PROPOFOL N/A 07/10/2019   Procedure: ESOPHAGOGASTRODUODENOSCOPY (EGD) WITH PROPOFOL;  Surgeon: Lollie Sails, MD;  Location: [facility] END[facility]COPY;  Service: Endoscopy;  Laterality: N/A;  . HYSTEROSCOPY WITH D & C N/A 11/05/2018   Procedure: DILATATION AND CURETTAGE /HYSTEROSCOPY;  Surgeon: Homero Fellers, MD;  Location: [facility] ORS;  Service: Gynecology;  Laterality: N/A;  . INSERT / REPLACE / REMOVE PACEMAKER    . 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 UTERINE POLYPS  REMOVAL       Social History   reports that she has quit smoking. Her smoking use included cigarettes. She has never used smokeless tobacco. She reports current alcohol use. She reports that she does not use drugs.   Family History   Her family history includes Atrial fibrillation in her mother; Breast cancer in her maternal aunt, mother, and paternal aunt; Stroke in her father and mother.   Allergies Allergies  Allergen Reactions  . Nickel      Home Medications  Prior to Admission medications   Medication Sig Start Date End Date Taking? Authorizing Provider  acetaminophen (TYLENOL) 325 MG tablet Take 325-650 mg by mouth every 6 (six) hours as needed for mild pain or headache.     [provider]  bisacodyl (DULCOLAX) 10 MG suppository Place 1 suppository (10 mg total) rectally daily as needed for moderate constipation. 04/02/20   Vashti Hey, MD  Clobetasol Prop Emollient Base (CLOBETASOL PROPIONATE E) 0.05 % emollient cream Apply 1 application topically every Sunday.     [provider]  desonide  (DESOWEN) 0.05 % ointment Apply 1 application topically 2 (two) times daily as needed (for psoriasis).     [provider]  enoxaparin (LOVENOX) 40 MG/0.4ML injection Inject 0.4 mLs (40 mg total) into the skin daily. 04/02/20   Vashti Hey, MD  famotidine (PEPCID) 20 MG tablet Take 20 mg by mouth 2 (two) times daily.    [provider]  Methylcellulose, Laxative, (CITRUCEL PO) Take 1 Dose by mouth.    [provider]  metroNIDAZOLE (METROCREAM) 0.75 % cream Apply topically 2 (two) times daily as needed (facial redness/irritation.).     [provider]  nystatin cream (MYCOSTATIN) Apply 1 application topically 2 (two) times daily as needed for dry skin.     [provider]  ondansetron (ZOFRAN) 4 MG tablet Take 1 tablet (4 mg total) by mouth every 6 (six) hours as needed for nausea. 04/02/20   Vashti Hey, MD  polyethylene glycol (MIRALAX / GLYCOLAX) 17 g packet Take 17 g by mouth daily as needed for severe constipation. 04/02/20   Vashti Hey, MD  rosuvastatin (CRESTOR) 5 MG tablet Take 1 tablet (5 mg total) by mouth daily. 04/29/19 03/27/20  Minna Merritts, MD  tacrolimus (PROTOPIC) 0.1 % ointment Apply topically 2 (two) times daily as needed (for psoriasis).     [provider]  valsartan (DIOVAN) 80 MG tablet TAKE 1 TABLET DAILY Patient taking differently: Take 80 mg by mouth daily.  11/05/19   Leone Haven, MD     Critical care time: 44 minutes  Chesley Mires, MD [place] Pager - [phone] 04/03/2020, 3:53 AM

## 2020-04-03 NOTE — Progress Notes (Signed)
Pt was alert and oriented x4 when she arrived to the unit, responsive and interractive.. After I had settled Pt. In bed, as I was about to leave the room, her oxygen saturation began to drop, checked all possible causes, but could not find any. Increased O2 from 2L  to 5L, still, no change in O2 saturation., called respiratory therapist and rapid response team. Put Pt on none-rebreather mask, Pt was gradually becoming unresponsive and was starring with agonal-like breathing. . Paged the doctor. Everyone called arrived. anesthesiologist was called, Pt  gradually become responsive, but breathing was shallow,  Pt was inturbated and transferred to higher level of care.

## 2020-04-03 NOTE — Progress Notes (Signed)
Pt has Assurity (Conditional) pacemaker with [id number] (Conditional) leads. To be ran under normal operating mode and is full body eligible.   Pt to be done during the week due to pacemaker. IR nurse not available during weekend to program implant.

## 2020-04-03 NOTE — Consult Note (Addendum)
Neurology Consultation  Reason for Consult: Sudden onset of unresponsiveness Referring Physician: Dr. Hal Hope, [facility] hospitalist  CC: Sudden onset of unresponsiveness  History is obtained from: Chart review  HPI: Alison Huffman is a 79 y.o. 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  Respiratory therapy came to check on her and found her to be staring in space and not responding to commands.  Rapid response was called.  She was evaluated by rapid response team and the overnight hospitalist.  No noted seizure activity.  No tongue bite or urinary incontinence. By the time I got up to the floor to see the patient, she was being bag mask ventilated but she was able to answer questions and follow commands.  She is known to be electively intubated because of potential aspiration and already existing difficulty with breathing and ability to maintain her airway.  Hospital course of [name]: At [name], she arrived on 03/27/2020 with progressive numbness and weakness.  She reportedly said that the numbness and tingling started in her lower extremities but then continued to progress upwards to involve her torso and then upper extremities.  She was seen by her family doctor and told her that she had some sort of  neuropathy but she came to the ER because this was becoming worse.  She was recently started on Enbrel for her psoriatic arthritis and had received only 2 doses. Neurological consultation was obtained.  Spinal tap recommended.  Normal protein.  No cells.  No evidence of albumin cytological dissociation. NIF/FVC is, although low, remained stable but she was not showing any improvement. Patient got IVIG-April 01, 2020 was day 4 of 5 but started having severe hyponatremia which became worse to the point where last sodium was 119.  Nephrology was consulted and they recommended stopping IVIG. Was transferred initially to [name] with the idea of getting spinal imaging after having discussed this with the daytime neurology service attending.  Most of the history was obtained from chart review.  No family at bedside.  Patient normally provide history at this time  ROS: Unable to obtain due to altered mental status.   Past Medical History:  Diagnosis Date  . Ankle pain, right   . Atypical chest pain    a. 10/2018 MV: EF 57%. No ischemia/infarct.  . Coronary artery disease   . Diastolic dysfunction    a. 04/2018 Echo: EF 55-60%, no rwma, Gr1 DD. Triv AI, mild MR. Mod dil LA. Nl RV fxn. PASP nl.  . GERD (gastroesophageal reflux disease)   . Headache    "dull one sometimes daily, at least weekly in last couple months" (07/22/2018)  . Hyperlipidemia   . Hypertension   . 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 Joint pain in fingers of right hand   . Lichen sclerosus   . Osteopenia   . Pneumonia 2012? X 1  . 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  Psoriasis   . Psoriatic arthritis ([name])    " dx'd the 1st of this year, 2019" (07/22/2018)  . Psoriatic arthritis ([name])   . Rosacea   . Seborrheic keratosis   . Second degree heart block    a. 07/2018 s/p SJM Assurity MRI model PM2271 (Ser# [id number]).  . Sleep apnea    "using nasal pilllows since ~ 12/2017" (07/22/2018)  . TMJ  (dislocation of temporomandibular joint)    Family History  Problem Relation Age of Onset  . Stroke Mother   . Atrial fibrillation Mother   . Breast cancer Mother   . Stroke Father   . Breast cancer Maternal Aunt   . Breast cancer Paternal Aunt    Social History:   reports that she has quit smoking. Her smoking use included cigarettes. She has never used smokeless tobacco. She reports current alcohol use. She reports that she does not use drugs.  Medications  Current Facility-Administered Medications:  .  levETIRAcetam (KEPPRA) 2,000 mg in sodium chloride 0.9 % 250 mL IVPB, 2,000 mg, Intravenous, Once, Rise Patience, MD .  levETIRAcetam (KEPPRA) IVPB 500 mg/100 mL premix, 500 mg, Intravenous, Q12H, Rise Patience, MD .  LORazepam (ATIVAN) 2 MG/ML injection, , , ,    Exam: Current vital signs: There were no vitals taken for this visit. Vital signs in last 24 hours: Temp:  [97.5 F (36.4 C)-98 F (36.7 C)] 97.5 F (36.4 C) (07/03 0031) Pulse Rate:  [80-99] 96 (07/03 0031) Resp:  [16-19] 19 (07/03 0031) BP: (143-166)/(75-104) 152/75 (07/03 0031) SpO2:  [89 %-95 %] 95 % (07/03 0031) General: Patient was awake, being bag mask ventilated. HEENT: Normocephalic atraumatic Respiratory: Rales all over, gurgling sounds could be heard just with palpation of the chest. Abdomen: Nondistended nontender Extremities warm well perfused Neurological exam She was awake, follows some commands but was being actively diagnosed ventilated.  According to reports from the nurses, she was completely unresponsive but this was a drastic change within less than 5 minutes-more awake now than she was 5 minutes ago. She was able to follow simple commands without any problem. Cranial nerves: Pupils equal round reactive to light, extraocular movements intact, visual fields appear full, face appears symmetric but it appears that she has a hard time phonating well and opening her mouth  completely-as had been noted on her prior neurological exams as well.  Cough and gag intact. Motor exam: 2/5 in bilateral upper extremities at the shoulders.  She is 3 out of 5 right elbow flexion extension and handgrip bilaterally. Hip flexors are 2/5.  Plantar and dorsiflexors are 4/5. Sensory exam: Difficult to assess given the acuity of the situation Coordination: Also difficult to assess given acuity of situation DTRs: Could not elicit any DTRs in the biceps, brachioradialis, knee or ankles. Mute plantars bilaterally.  Labs I have reviewed labs in epic and the results pertinent to this consultation are: CBC    Component Value Date/Time   WBC 8.5 04/02/2020 0609   RBC 4.28 04/02/2020 0609   HGB 13.2 04/02/2020 0609   HCT 36.6 04/02/2020 0609   PLT 222 04/02/2020 0609   MCV 85.5 04/02/2020 0609   MCH 30.8 04/02/2020 0609   MCHC 36.1 (H) 04/02/2020 0609   RDW 12.4 04/02/2020 0609   LYMPHSABS 1.3 03/25/2020 0950   MONOABS 0.3 03/25/2020 0950   EOSABS 0.1 03/25/2020 0950   BASOSABS 0.0 03/25/2020 0950    CMP     Component Value Date/Time   NA  119 (LL) 04/02/2020 0609   K 3.8 04/02/2020 0609   CL 89 (L) 04/02/2020 0609   CO2 22 04/02/2020 0609   GLUCOSE 104 (H) 04/02/2020 0609   BUN 12 04/02/2020 0609   CREATININE 0.43 (L) 04/02/2020 0609   CALCIUM 8.0 (L) 04/02/2020 0609   PROT 8.8 (H) 04/02/2020 0609   ALBUMIN 2.9 (L) 04/02/2020 0609   AST 54 (H) 04/02/2020 0609   ALT 41 04/02/2020 0609   ALKPHOS 64 04/02/2020 0609   BILITOT 0.7 04/02/2020 0609   GFRNONAA >60 04/02/2020 0609   GFRAA >60 04/02/2020 5093    Imaging I have reviewed the images obtained: CT head and CT angiogram head and neck 03/27/2020 with no significant acute intracranial abnormality.  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   Assessment:  79 year old with above past medical history, who was transferred from [facility] hospital for  spinal imaging because of no clear etiology of this progressive numbness and weakness that started in the lower extremities and gradually ascended up to involve the torso and upper extremities-on IVIG with minimal to no benefit, with the thought process that now her spinal cord needs imaging, came from [facility] and soon after arrival to [facility] had an episode of unresponsiveness/staring spell and required bag mask ventilation.  By the time I saw her a few minutes after this episode, she was starting to come around. Of note: She developed severe hyponatremia-last sodium 119. Putting all this together, I think she likely had a provoked seizure in the setting of hyponatremia. I am not sure, with my limited exam during this event and rapid response as to what to make of her progressive ascending weakness but I agree with the whole spine imaging.  She will be intubated electively.  I would recommend adding the brain MRI as well. Demyelination versus spinal cord infarct versus a high cervical lesion could all be in the differentials for her current presentation.    Recommendations: I recommended that she be loaded with Keppra 2 g IV. Start Keppra 500 after that I do not think she will need Keppra in the longer term because the seizure probably was provoked by severe acute hyponatremia. Management of hyponatremia per primary team-avoid more than 10 point correction in 24 hours to avoid any risk of osmotic demyelination. I would agree with holding off on IVIG for now although I am not really sure if IVIG truly was the culprit for the hyponatremia. MRI of the brain, C-spine, T-spine and L-spine with and without contrast after she is intubated. EEG in the AM  Neurology will follow with you.  -- Amie Portland, MD [facility] Neuro[facility] Pager: [phone] If 7pm to 7am, please call on call as listed on AMION.   CRITICAL CARE ATTESTATION Performed by: Amie Portland,  MD Total critical care time: 39 minutes Critical care time was exclusive of separately billable procedures and treating other patients and/or supervising APPs/Residents/Students Critical care was necessary to treat or prevent imminent or life-threatening deterioration due to severe hyponatremia, provoked seizure, respiratory failure, possible aspiration pneumonia. This patient is critically ill and at significant risk for neurological worsening and/or death and care requires constant monitoring. Critical care was time spent personally by me on the following activities: development of treatment plan with patient and/or surrogate as well as nursing, discussions with consultants, evaluation of patient's response to treatment, examination of patient, obtaining history from patient or surrogate, ordering and performing treatments and interventions, ordering  and review of laboratory studies, ordering and review of radiographic studies, pulse oximetry, re-evaluation of patient's condition, participation in multidisciplinary rounds and medical decision making of high complexity in the care of this patient.

## 2020-04-03 NOTE — Procedures (Signed)
Date of Study: 04/03/2020  Reason for Study:  Pt is a 78 YOF with PMH of HTN, HLD, CAD, PSORIASIS/ARTHRITIS who has developed weakness/numbness/tingling, hyponatremia and had possible seizure like event.  Eval for seizures.  Description of recording:  This recording was obtained using a Digital EEG system with 18 channel capacity . Standard bipolar and referential EEG montages were used following the International 10 - 20 System .   This is a digitally recorded EEG with duration of approximately ~23:53 minutes. The background consists of 4-6 hz delta/theta rhythm throughout study.  There were no epileptiform discharges or electrographic seizures noted. Hyperventilation was not performed. Photic stimulation was not performed. Sleep did not occur in this recording.   IMPRESSION: This is an abnormal EEG for age due to diffuse background slowing which can be seen with toxic/metabolic encephalopathy, cerebral ischemia, intracerebral hemorrhage, tumors, traumatic brain injury, cortical malformations, or other cerebral dysfunction. There were no clinical seizures or epileptiform discharges noted during study. Clinical correlation is advised.   I have personally reviewed this study.

## 2020-04-03 NOTE — Transfer of Care (Signed)
OOD Intubation

## 2020-04-03 NOTE — Progress Notes (Signed)
NAME:  Alison Huffman, MRN:  [id number], DOB:  Dec 22, 1940, LOS: 0 ADMISSION DATE:  04/03/2020, CONSULTATION DATE:  04/03/2020 REFERRING MD:  Dr. Hal Hope, [facility], CHIEF COMPLAINT:  Respiratory failure   Brief History   79 yo female former smoker transferred to [facility] from [facility] [place] on 6/26 with weakness, numbness, and tingling.  She was recently started on enbrel for psoriasis.  Also developed progressive hyponatremia.  Transferred to [facility] on 7/03 for further neurologic assessment.  Required intubation for airway protection with concern for seizure and transferred to ICU.  Hx from chart and medical staff.  Past Medical History  CAD, s/p PM, GERD, HTN, HLD, Psoriasis, Arthritis, OSA  Significant Hospital Events   6/26 Admit to [facility] and seen by neurology 6/28 start IVIG 7/02 seen by nephrology at [facility] >> d/c IVIG due to concern about this causing dilutional hyponatremia 7/03 transfer from [facility]; possible seizure, start keppra, intubated, transferred to ICU  Consults:  Nephrology [facility]) Neurology  Procedures:  ETT 7/03 >>   Significant Diagnostic Tests:   CT angio head/neck 6/26 >> 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 rate 18, weight 76 kg, SpO2 98 %.    Vent Mode: PRVC FiO2 (%):  [40 %-100 %] 40 % Set Rate:  [16 bmp-18 bmp] 18 bmp Vt Set:  [470 mL] 470 mL PEEP:  [5 cmH20] 5 cmH20 Plateau Pressure:  [15 cmH20] 15 cmH20   Intake/Output Summary (Last 24 hours) at 04/03/2020 0830 Last data filed at 04/03/2020  0500 Gross per 24 hour  Intake 1207.48 ml  Output --  Net 1207.48 ml   Filed Weights   04/03/20 0459  Weight: 76 kg    Examination:  General - intubated, lightly sedated on precedex but not arousable Eyes - pupils reactive ENT - ETT in place, mmmp Cardiac - regular rate/rhythm, no murmur Chest - scattered rhonchi Abdomen - soft, non tender, + bowel sounds Extremities - no cyanosis, clubbing, or edema Skin - no rashes Neuro - unresponsive, spontaneously moving b/l toes, nothing to command and flaccid ue   Resolved Hospital Problem list     Assessment & Plan:   Acute hypoxic respiratory failure with compromised airway 2nd to seizure in setting of hyponatremia and progressive weakness. Hx of OSA. - full vent support - cxr with increased bibasilar infiltrates, personally reviewed by me -sat/sbt daily  Numbness/weakness progressing from lower extremities up. - no improvement with IVIG - MRI brain, C/T/L spine -appreciate neuro  Seizure in setting of hyponatremia. - started on keppra - normal saline IV fluid at 30 ml/hr - BMET q4h and adjust IV fluids to avoid too rapid a correction in sodium but was acute decrease to 119 from 125 so level at this time ok.   Hyperglycemia. - SSI  Hypotension after intubation. - likely from sedation - NS fluid bolus 1 liter -titrate neo gtt to map >65  Chronic HFpEF:  -no acute exacerbation  Hx of HTN, HLD. - hold outpt crestor, diovan  Hx of psoriasis. - hold  outpt enbrel  Urinary retention:  -place foley for now -esp in light of hypotension  Best practice:  Diet: tube feeds DVT prophylaxis: lovenox GI prophylaxis: protonix Mobility: bed rest Code Status: full code Disposition: ICU Family communication: updated niece at bedside  Labs   CBC: Recent Labs  Lab 03/30/20 0324 03/30/20 0324 03/31/20 0415 04/01/20 0443 04/02/20 0609 04/03/20 0230 04/03/20 0454  WBC 6.5  --  6.1 5.3 8.5 15.5*  --   NEUTROABS   --   --   --   --   --  12.8*  --   HGB 13.1   < > 12.6 12.3 13.2 14.6 11.9*  HCT 38.4   < > 37.7 35.9* 36.6 44.9 35.0*  MCV 89.5  --  90.0 88.6 85.5 91.3  --   PLT 215  --  215 209 222 237  --    < > = values in this interval not displayed.    Basic Metabolic Panel: Recent Labs  Lab 03/30/20 0324 03/30/20 0324 03/31/20 0415 04/01/20 0443 04/02/20 0609 04/03/20 0230 04/03/20 0454  NA 129*   < > 124* 125* 119* 119* 126*  K 3.8   < > 3.8 3.7 3.8 4.6 3.4*  CL 98  --  94* 95* 89* 89*  --   CO2 27  --  25 25 22  19*  --   GLUCOSE 108*  --  109* 102* 104* 244*  --   BUN 12  --  11 13 12 12   --   CREATININE 0.44  --  0.55 0.47 0.43* 0.61  --   CALCIUM 8.1*  --  7.9* 8.1* 8.0* 8.3*  --   MG  --   --   --   --  2.1 2.2  --   PHOS  --   --   --   --   --  4.9*  --    < > = values in this interval not displayed.   GFR: Estimated Creatinine Clearance: 58.4 mL/min (by C-G formula based on SCr of 0.61 mg/dL). Recent Labs  Lab 03/31/20 0415 04/01/20 0443 04/02/20 0609 04/03/20 0230  WBC 6.1 5.3 8.5 15.5*    Liver Function Tests: Recent Labs  Lab 04/02/20 0609 04/03/20 0230  AST 54* 82*  ALT 41 63*  ALKPHOS 64 85  BILITOT 0.7 1.3*  PROT 8.8* 9.0*  ALBUMIN 2.9* 3.0*   No results for input(s): LIPASE, AMYLASE in the last 168 hours. No results for input(s): AMMONIA in the last 168 hours.  ABG    Component Value Date/Time   PHART 7.374 04/03/2020 0454   PCO2ART 34.9 04/03/2020 0454   PO2ART 236 (H) 04/03/2020 0454   HCO3 20.4 04/03/2020 0454   TCO2 21 (L) 04/03/2020 0454   ACIDBASEDEF 4.0 (H) 04/03/2020 0454   O2SAT 100.0 04/03/2020 0454     Coagulation Profile: No results for input(s): INR, PROTIME in the last 168 hours.  Cardiac Enzymes: No results for input(s): CKTOTAL, CKMB, CKMBINDEX, TROPONINI in the last 168 hours.  HbA1C: Hgb A1c MFr Bld  Date/Time Value Ref Range Status  04/03/2020 02:30 AM 6.0 (H) 4.8 - 5.6 % Final    Comment:    (NOTE) Pre  diabetes:          5.7%-6.4%  Diabetes:              >6.4%  Glycemic control for   <7.0% adults with diabetes   03/24/2020 02:38 PM 6.1 4.6 - 6.5 %  Final    Comment:    Glycemic Control Guidelines for People with Diabetes:Non Diabetic:  <6%Goal of Therapy: <7%Additional Action Suggested:  >8%     CBG: Recent Labs  Lab 04/03/20 0205 04/03/20 0313 04/03/20 0820  GLUCAP 246* 203* 128*   Critical care time: The patient is critically ill with multiple organ systems failure and requires high complexity decision making for assessment and support, frequent evaluation and titration of therapies, application of advanced monitoring technologies and extensive interpretation of multiple databases.  Critical care time additional 35 mins. This represents my time independent of the NPs time taking care of the pt. This is excluding procedures.    [place] 04/03/2020, 8:30 AM

## 2020-04-03 NOTE — Progress Notes (Signed)
Received order from Dr. Juleen China to start NS at 93ml/hr, confirmed order as well.

## 2020-04-03 NOTE — Progress Notes (Signed)
Initial Nutrition Assessment  RD working remotely.   DOCUMENTATION CODES:   Not applicable  INTERVENTION:  - will order Vital AF 1.2 @ 45 ml/hr with 30 ml prostat once/day to provide 1396 kcal (99% estimated kcal need), 96 grams protein, and 876 ml free water. - free water flush, if desired, to be per CCM given hyponatremia and hypochloridemia.    NUTRITION DIAGNOSIS:   Inadequate oral intake related to inability to eat as evidenced by NPO status.  GOAL:   Patient will meet greater than or equal to 90% of their needs  MONITOR:   Vent status, TF tolerance, Labs, Weight trends, Skin  REASON FOR ASSESSMENT:   Ventilator, Consult Enteral/tube feeding initiation and management  ASSESSMENT:   79 year-old female former smoker with medical hx of HLD, HTN, GERD, osteopenia, psoriasis, rosacea, sclerosus, neuropathy, sleep apnea, arthritis, s/p pacemaker placement, CAD, and TMJ. She was transferred to [facility] from [place] on 6/26 with weakness, numbness, and tingling.  She was recently started on enbrel for psoriasis. Transferred to [facility] on 7/3 for further neurologic assessment.  Required intubation for airway protection with concern for seizure.  Patient is intubated with OGT in place (side port at the distal stomach/pylorus). Weight today is 167 lb and weight has been stable over the past 6 months.   Per notes: - compromised airway 2/2 seizure - hyponatremia with progressive weakness/numbness starting in lower extremities and moving up--no improvement with IVIG - pending MRI brain and cervical/thoracic/lumbar spine - Neuro following--EEG done pending reading - urinary retention--Foley placed   Patient is currently intubated on ventilator support MV: 8.4 L/min Temp (24hrs), Avg:97.8 F (36.6 C), Min:97.3 F (36.3 C), Max:98.5 F (36.9 C) Propofol: none  Labs reviewed; CBGs: 246, 203, 128 mg/dl, Na: 126 mmol/l, K: 3.4 mmol/l, Cl: 89  mmol/l, Ca: 8.3 mg/dl, ionized Ca: 1.13 mmol/l, LFTs slightly elevated. Medications reviewed; 100 mg colace BID, sliding scale novolog, 17 g miralax/day. IVF; NS @ 30 ml/hr. Drips; neo @ 50 mcg/min, precedex @ 0.6 mcg/kg/hr.     NUTRITION - FOCUSED PHYSICAL EXAM:  unable to complete at this time.   Diet Order:   Diet Order            Diet NPO time specified  Diet effective now                 EDUCATION NEEDS:   Not appropriate for education at this time  Skin:  Skin Assessment: Skin Integrity Issues: Skin Integrity Issues:: Stage I Stage I: sacrum  Last BM:  PTA/unknown  Height:   Ht Readings from Last 1 Encounters:  03/27/20 5\' 6"  (1.676 m)    Weight:   Wt Readings from Last 1 Encounters:  04/03/20 76 kg     Estimated Nutritional Needs:  Kcal:  1413 kcal Protein:  91-114 grams Fluid:  >/= 1.7 L/day     Jarome Matin, MS, RD, LDN, CNSC Inpatient Clinical Dietitian RD pager # available in [place]  After hours/weekend pager # available in [facility]

## 2020-04-03 NOTE — Significant Event (Signed)
The nurse notified me that patient became unresponsive at around 2 AM today.  Patient just arrived from [facility] for further work-up on her progressive weakness cause was not clear and also was found to be having hyponatremia last sodium was 119.  I reviewed patient's charts and notes and labs.  On arrival at the bedside patient blood pressure was more than 224 systolic with heart rate around 80 bpm breathing around 8 to 10 breaths/min afebrile saturating around 90%.  Patient was unresponsive pupils are reacting to light.  We started bag mask and on exam bilateral air entry present but appears bilateral bronchial breathing.  CBG was more than 200.  Per the respiratory tech who had come to the bedside earlier before this episode to check NIF and vital capacity patient was having staring spell and not talking prior to becoming unresponsive.  It was felt that patient likely had a seizure neurologist on-call Dr. Demetra Shiner was contacted who presents to the bedside and felt that patient likely had a seizure.  Few minutes later patient became awake was able to tell her name but still short of breath lethargic and on exam patient did have a tongue bite.  It was decided to intubate patient.  Patient's family was notified and critical care was consulted.  Repeat labs have been ordered along with chest x-ray.  As per neurologist recommendation I placed patient on 2 g Keppra loading dose for possible seizure and 500 mg IV every 12.  Patient's family, nursing staff and critical care updated.  Gean Birchwood.

## 2020-04-03 NOTE — Progress Notes (Signed)
eLink Physician-Brief Progress Note Patient Name: Alison Huffman DOB: 1940-10-22 MRN: [id number]   Date of Service  04/03/2020  HPI/Events of Note  79 yo female former smoker transferred to [facility] from [facility] [place] on 6/26 with weakness, numbness, and tingling.  She was recently started on enbrel for psoriasis.  Also developed progressive hyponatremia.  Transferred to [facility] on 7/03 for further neurologic assessment.  Required intubation for airway protection with concern for seizure and transferred to ICU.  eICU Interventions  New Patient Evaluation completed.        Kerry Kass Alison Huffman 04/03/2020, 5:01 AM

## 2020-04-03 NOTE — Progress Notes (Signed)
EEG completed, results pending. 

## 2020-04-03 NOTE — Significant Event (Addendum)
Rapid Response Event Note  Overview: Unresponsiveness with agonal respirations  Initial Focused Assessment: I was notified by the nursing staff pt was breathing with sats 78%. Instructed staff to place pt on NRB at 15L while I was on the way to the bedside and notify the primary provider. Upon my arrival, Dr. Hal Hope was at the bedside. Pt was unresponsive and agonally breathing with partial upper airway obstruction. Bag mask ventilation initiated and anesthesia consulted for intubation. Dr. Roanna Banning and Virgel Gess CRNA came to bedside. Dr. Rory Percy and Dr. Hal Hope were also at bedside. Prior to intubation, pt did become responsive, answered simple questions, followed commands, verbalized understanding and consented verbally to intubation. Decision was made to intubate based on possible aspiration and copious amount of oral secretions. Reportedly, staff reported pt had blank stare prior to LOC change. Pt intubated by anesthesia and transferred to 6E15 without complication.  0310- HR 110, 112/68 (78), RR 20 sats 100% on 100% Fio2  Interventions: -PIV -Anesthesia for intubation -tx 3M11    Event Summary: Call received 0200 Arrived at call 0205 Call ended 0345  Madelynn Done

## 2020-04-03 NOTE — Progress Notes (Signed)
CRITICAL VALUE ALERT  Critical Value:  Sodium   Date & Time Notied: 07/3 0330  Provider Notified: Elink RN  Orders Received/Actions taken: RN will make E-link

## 2020-04-03 NOTE — Anesthesia Procedure Notes (Signed)
Procedure Name: Intubation Date/Time: 04/03/2020 2:32 AM Performed by: Claris Che, CRNA Pre-anesthesia Checklist: Patient identified, Emergency Drugs available, Suction available, Patient being monitored and Timeout performed Patient Re-evaluated:Patient Re-evaluated prior to induction Oxygen Delivery Method: Ambu bag Preoxygenation: Pre-oxygenation with 100% oxygen Induction Type: IV induction, Rapid sequence and Cricoid Pressure applied Ventilation: Mask ventilation without difficulty Laryngoscope Size: Mac and 4 Grade View: Grade II Tube size: 7.5 mm Number of attempts: 1 Airway Equipment and Method: Stylet Placement Confirmation: ETT inserted through vocal cords under direct vision,  CO2 detector and breath sounds checked- equal and bilateral Secured at: 22 cm Tube secured with: Tape Dental Injury: Teeth and Oropharynx as per pre-operative assessment

## 2020-04-03 NOTE — Progress Notes (Signed)
MRI brain, cervical, thoracic and lumbar spine with and without contrast pending. These studies are needed to rule out brainstem (less likely) and/or spinal cord pathology (transverse myelitis) as well as to assess for possible nerve root enhancement with thickening (GBS).   EEG to be done this AM.   Of note, 11.8% of patients with GBS experience hyponatremia (compared to 4% in non-GBS patients), per the literature (Hyponatremia in Guillain-Barr Syndrome Kavelin Rumalla et al, J Clin Neuromuscul Dis 2017).   Also of note, IVIG can result in pseudohyponatremia.   Electronically signed: Dr. Kerney Elbe

## 2020-04-04 ENCOUNTER — Inpatient Hospital Stay (HOSPITAL_COMMUNITY): Payer: Medicare Other

## 2020-04-04 DIAGNOSIS — R579 Shock, unspecified: Secondary | ICD-10-CM

## 2020-04-04 LAB — GLUCOSE, CAPILLARY
Glucose-Capillary: 133 mg/dL — ABNORMAL HIGH (ref 70–99)
Glucose-Capillary: 122 mg/dL — ABNORMAL HIGH (ref 70–99)
Glucose-Capillary: 125 mg/dL — ABNORMAL HIGH (ref 70–99)
Glucose-Capillary: 124 mg/dL — ABNORMAL HIGH (ref 70–99)
Glucose-Capillary: 134 mg/dL — ABNORMAL HIGH (ref 70–99)

## 2020-04-04 LAB — MAGNESIUM
Magnesium: 2.3 mg/dL (ref 1.7–2.4)
Magnesium: 2.1 mg/dL (ref 1.7–2.4)

## 2020-04-04 LAB — CBC
HCT: 44.6 % (ref 36.0–46.0)
Hemoglobin: 14.6 g/dL (ref 12.0–15.0)
MCH: 29.9 pg (ref 26.0–34.0)
MCHC: 32.7 g/dL (ref 30.0–36.0)
MCV: 91.2 fL (ref 80.0–100.0)
Platelets: 180 10*3/uL (ref 150–400)
RBC: 4.89 MIL/uL (ref 3.87–5.11)
RDW: 12.7 % (ref 11.5–15.5)
WBC: 9.9 10*3/uL (ref 4.0–10.5)
nRBC: 0 % (ref 0.0–0.2)

## 2020-04-04 LAB — BASIC METABOLIC PANEL
Anion gap: 7 (ref 5–15)
BUN: 18 mg/dL (ref 8–23)
CO2: 20 mmol/L — ABNORMAL LOW (ref 22–32)
Calcium: 8.3 mg/dL — ABNORMAL LOW (ref 8.9–10.3)
Chloride: 104 mmol/L (ref 98–111)
Creatinine, Ser: 0.47 mg/dL (ref 0.44–1.00)
GFR calc Af Amer: >60 mL/min (ref >60)
GFR calc non Af Amer: >60 mL/min (ref >60)
Glucose, Bld: 121 mg/dL — ABNORMAL HIGH (ref 70–99)
Potassium: 4.2 mmol/L (ref 3.5–5.1)
Sodium: 131 mmol/L — ABNORMAL LOW (ref 135–145)

## 2020-04-04 LAB — PHOSPHORUS
Phosphorus: 2.5 mg/dL (ref 2.5–4.6)
Phosphorus: 2.6 mg/dL (ref 2.5–4.6)

## 2020-04-04 NOTE — Progress Notes (Signed)
NAME:  Alison Huffman, MRN:  [id number], DOB:  07-09-1941, LOS: 1 ADMISSION DATE:  04/03/2020, CONSULTATION DATE:  04/03/2020 REFERRING MD:  Dr. Hal Hope, [facility], CHIEF COMPLAINT:  Respiratory failure   Brief History   79 yo female former smoker transferred to [facility] from [facility] [place] on 6/26 with weakness, numbness, and tingling.  She was recently started on enbrel for psoriasis.  Also developed progressive hyponatremia.  Transferred to [facility] on 7/03 for further neurologic assessment.  Required intubation for airway protection with concern for seizure and transferred to ICU.  Hx from chart and medical staff.  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 Tolerating sbt. No acute events overnight.   Objective   Blood pressure (!) 164/63, pulse 64, temperature 99.3 F (37.4 C), temperature source Axillary, resp. rate (!) 23, weight 79.5 kg, SpO2 92 %.    Vent Mode: CPAP;PSV FiO2 (%):  [40 %-50 %] 50 % Set Rate:  [18 bmp] 18 bmp Vt Set:  [470 mL] 470 mL PEEP:  [5 cmH20] 5 cmH20 Pressure Support:   [10 cmH20] 10 cmH20 Plateau Pressure:  [11 cmH20-15 cmH20] 11 cmH20   Intake/Output Summary (Last 24 hours) at 04/04/2020 1011 Last data filed at 04/04/2020 0700 Gross per 24 hour  Intake 2555.35 ml  Output 2695 ml  Net -139.65 ml   Filed Weights   04/03/20 0459 04/04/20 0500  Weight: 76 kg 79.5 kg    Examination:  General - intubated, awake and attempting to follow commands  Eyes - pupils reactive, sclera anicteric, EOMI ENT - ETT in place, mmmp Cardiac - regular rate/rhythm, no murmur Chest - diminished bilaterally Abdomen - soft, non tender, + bowel sounds Extremities - no cyanosis, clubbing, or edema Skin - no rashes Neuro - awake and following command, antigravity in all extremities but very weak. Denies sensory deficits upon testing.    Resolved Hospital Problem list     Assessment & Plan:   Acute hypoxic respiratory failure with compromised airway 2nd to seizure in setting of hyponatremia and progressive weakness. Hx of OSA. - full vent support with daily sat/sbt -tolerating today (first day) -much more alert but profoundly weak in all extremities and unable to perform sustained head lift   Numbness/weakness progressing from lower extremities up. - no improvement with IVIG - MRI brain, C/T/L spine tomorrow -pt still profoundly weak, unsure if she will tolerate mri without ett so will cont with sbt but maintain vent until MRI completion -appreciate neuro  Seizure in setting of hyponatremia. - started on keppra - sodium improving -stop ivf with tube feeds  starting -repeat bmp in am -eeg without sz  Hyperglycemia. - SSI  Hypotension after intubation. -pressors paused as of 0600 this am 7/4 -titrate neo gtt to map >65  Chronic HFpEF:  -no acute exacerbation  Hx of HTN, HLD. - hold outpt crestor, diovan  Hx of psoriasis. - hold outpt enbrel  Urinary retention:  -foley  Best practice:  Diet: tube feeds DVT prophylaxis: lovenox GI prophylaxis:  protonix Mobility: bed rest Code Status: full code Disposition: ICU Family communication: updated niece at bedside  Labs   CBC: Recent Labs  Lab 03/31/20 0415 03/31/20 0415 04/01/20 0443 04/02/20 0609 04/03/20 0230 04/03/20 0454 04/04/20 0251  WBC 6.1  --  5.3 8.5 15.5*  --  9.9  NEUTROABS  --   --   --   --  12.8*  --   --   HGB 12.6   < > 12.3 13.2 14.6 11.9* 14.6  HCT 37.7   < > 35.9* 36.6 44.9 35.0* 44.6  MCV 90.0  --  88.6 85.5 91.3  --  91.2  PLT 215  --  209 222 237  --  180   < > = values in this interval not displayed.    Basic Metabolic Panel: Recent Labs  Lab 04/02/20 0609 04/02/20 0609 04/03/20 0230 04/03/20 0454 04/03/20 0947 04/03/20 1349 04/03/20 1720 04/04/20 0251  NA 119*   < > 119* 126* 128* 129*  --  131*  K 3.8   < > 4.6 3.4* 4.2 4.2  --  4.2  CL 89*  --  89*  --  100 102  --  104  CO2 22  --  19*  --  22 21*  --  20*  GLUCOSE 104*  --  244*  --  134* 131*  --  121*  BUN 12  --  12  --  13 17  --  18  CREATININE 0.43*  --  0.61  --  0.54 0.52  --  0.47  CALCIUM 8.0*  --  8.3*  --  8.2* 8.2*  --  8.3*  MG 2.1  --  2.2  --   --   --  2.4 2.3  PHOS  --   --  4.9*  --   --   --  3.0 2.5   < > = values in this interval not displayed.   GFR: Estimated Creatinine Clearance: 59.7 mL/min (by C-G formula based on SCr of 0.47 mg/dL). Recent Labs  Lab 04/01/20 0443 04/02/20 0609 04/03/20 0230 04/04/20 0251  WBC 5.3 8.5 15.5* 9.9    Liver Function Tests: Recent Labs  Lab 04/02/20 0609 04/03/20 0230  AST 54* 82*  ALT 41 63*  ALKPHOS 64 85  BILITOT 0.7 1.3*  PROT 8.8* 9.0*  ALBUMIN 2.9* 3.0*   No results for input(s): LIPASE, AMYLASE in the last 168 hours. No results for input(s): AMMONIA in the last 168 hours.  ABG    Component Value Date/Time   PHART 7.374 04/03/2020 0454   PCO2ART 34.9 04/03/2020 0454   PO2ART 236 (H) 04/03/2020 0454   HCO3 20.4 04/03/2020 0454   TCO2 21 (L) 04/03/2020 0454   ACIDBASEDEF 4.0 (H) 04/03/2020  0454   O2SAT 100.0 04/03/2020 0454     Coagulation Profile: No results for input(s): INR, PROTIME in the last 168 hours.  Cardiac Enzymes: No results for input(s): CKTOTAL, CKMB, CKMBINDEX, TROPONINI in the last 168 hours.  HbA1C: Hgb A1c MFr Bld  Date/Time Value  Ref Range Status  04/03/2020 02:30 AM 6.0 (H) 4.8 - 5.6 % Final    Comment:    (NOTE) Pre diabetes:          5.7%-6.4%  Diabetes:              >6.4%  Glycemic control for   <7.0% adults with diabetes   03/24/2020 02:38 PM 6.1 4.6 - 6.5 % Final    Comment:    Glycemic Control Guidelines for People with Diabetes:Non Diabetic:  <6%Goal of Therapy: <7%Additional Action Suggested:  >8%     CBG: Recent Labs  Lab 04/03/20 1536 04/03/20 1949 04/03/20 2352 04/04/20 0345 04/04/20 0738  GLUCAP 106* 148* 110* 133* 122*   Critical care time: The patient is critically ill with multiple organ systems failure and requires high complexity decision making for assessment and support, frequent evaluation and titration of therapies, application of advanced monitoring technologies and extensive interpretation of multiple databases.  Critical care time additional 38 mins. This represents my time independent of the NPs time taking care of the pt. This is excluding procedures.    Audria Nine DO [place] [facility] 04/04/2020, 10:11 AM

## 2020-04-04 NOTE — Plan of Care (Signed)

## 2020-04-04 NOTE — Progress Notes (Addendum)
Subjective: Patient intubated, awake, alert, NAD. Daughter at bedside. Follows commands and is trying to communicate.   Objective: Current vital signs: BP (!) 164/63   Pulse 64   Temp 99.3 F (37.4 C) (Axillary)   Resp (!) 23   Wt 79.5 kg   SpO2 92%   BMI 28.29 kg/m  Vital signs in last 24 hours: Temp:  [97.7 F (36.5 C)-99.3 F (37.4 C)] 99.3 F (37.4 C) (07/04 0742) Pulse Rate:  [58-66] 64 (07/04 0800) Resp:  [18-23] 23 (07/04 0800) BP: (112-165)/(49-83) 164/63 (07/04 0800) SpO2:  [89 %-99 %] 92 % (07/04 0800) FiO2 (%):  [40 %] 40 % (07/04 0400) Weight:  [79.5 kg] 79.5 kg (07/04 0500)  Intake/Output from previous day: 07/03 0701 - 07/04 0700 In: 3589.1 [I.V.:2545.4; NG/GT:810.3; IV Piggyback:233.5] Out: 2695 [Urine:2595; Emesis/NG output:100] Intake/Output this shift: No intake/output data recorded. Nutritional status:  Diet Order            Diet NPO time specified  Diet effective now                General exam: HEENT: normocephalic. Lungs: intubated Ext: warm and dry  Neurologic Exam: Ment: Awake, alert, following commands CN: PERRL, EOM are weak, eyelid opening and eyelid closure are weak bilaterally, face appears symmetric in presence of ETT. When asked to take a breath over the vent, she uses shoulder accessory muscles of respiration. Neck flexion: Able to nod and shake head slightly, but unable to raise off pillow.  Motor:  RUE: biceps 3/5, wrist flexion/extension 3/5, deltoid 1/5, 2/5 weak hand grip, finger extensors 2/5, finger abduction 1/5 LUE: biceps 3/5 wrist flexion/extension 3/5, deltoid 1/5, 3/5 hand grip, finger extensors 3/5, finger abduction 1/5 BLE: 3/5 dorsi/plantar flexion; 4-/5 strength hip flexors - was able to quickly break gravity with BLE Sensory: Intact to LT x 4.  Reflexes: 0 biceps, triceps, brachioradialis, patellae and achilles bilaterally. Toes are mute.  Cerebellar: Unable to formally assess, but did not note any gross  ataxia Gait: Unable to assess  Lab Results: Results for orders placed or performed during the hospital encounter of 04/03/20 (from the past 48 hour(s))  Glucose, capillary     Status: Abnormal   Collection Time: 04/03/20  2:05 AM  Result Value Ref Range   Glucose-Capillary 246 (H) 70 - 99 mg/dL    Comment: Glucose reference range applies only to samples taken after fasting for at least 8 hours.  Basic metabolic panel     Status: Abnormal   Collection Time: 04/03/20  2:30 AM  Result Value Ref Range   Sodium 119 (LL) 135 - 145 mmol/L    Comment: CRITICAL RESULT CALLED TO, READ BACK BY AND VERIFIED WITH: CUMMINGS B,RN 04/03/20 0339 WAYK    Potassium 4.6 3.5 - 5.1 mmol/L    Comment: SLIGHT HEMOLYSIS   Chloride 89 (L) 98 - 111 mmol/L   CO2 19 (L) 22 - 32 mmol/L   Glucose, Bld 244 (H) 70 - 99 mg/dL    Comment: Glucose reference range applies only to samples taken after fasting for at least 8 hours.   BUN 12 8 - 23 mg/dL   Creatinine, Ser 0.61 0.44 - 1.00 mg/dL   Calcium 8.3 (L) 8.9 - 10.3 mg/dL   GFR calc non Af Amer >60 >60 mL/min   GFR calc Af Amer >60 >60 mL/min   Anion gap 11 5 - 15    Comment: Performed at [facility] [address],  [place], [place] [address]  TSH     Status: None   Collection Time: 04/03/20  2:30 AM  Result Value Ref Range   TSH 1.162 0.350 - 4.500 uIU/mL    Comment: Performed by a 3rd Generation assay with a functional sensitivity of <=0.01 uIU/mL. Performed at [facility], [place] [address] [place], [place] [address]   Cortisol     Status: None   Collection Time: 04/03/20  2:30 AM  Result Value Ref Range   Cortisol, Plasma 63.2 ug/dL    Comment: RESULTS CONFIRMED BY MANUAL DILUTION Performed at [facility], [place] [address], [place], [place] [address]   Hepatic function panel     Status: Abnormal   Collection Time: 04/03/20  2:30 AM  Result Value Ref Range   Total Protein 9.0 (H) 6.5 - 8.1 g/dL   Albumin 3.0 (L) 3.5 - 5.0 g/dL    AST 82 (H) 15 - 41 U/L   ALT 63 (H) 0 - 44 U/L   Alkaline Phosphatase 85 38 - 126 U/L   Total Bilirubin 1.3 (H) 0.3 - 1.2 mg/dL   Bilirubin, Direct 0.4 (H) 0.0 - 0.2 mg/dL   Indirect Bilirubin 0.9 0.3 - 0.9 mg/dL    Comment: Performed at [facility] [address] [place], [place] [address]  CBC with Differential/Platelet     Status: Abnormal   Collection Time: 04/03/20  2:30 AM  Result Value Ref Range   WBC 15.5 (H) 4.0 - 10.5 K/uL   RBC 4.92 3.87 - 5.11 MIL/uL   Hemoglobin 14.6 12.0 - 15.0 g/dL   HCT 44.9 36 - 46 %   MCV 91.3 80.0 - 100.0 fL   MCH 29.7 26.0 - 34.0 pg   MCHC 32.5 30.0 - 36.0 g/dL   RDW 12.5 11.5 - 15.5 %   Platelets 237 150 - 400 K/uL   nRBC 0.0 0.0 - 0.2 %   Neutrophils Relative % 83 %   Neutro Abs 12.8 (H) 1.7 - 7.7 K/uL   Lymphocytes Relative 11 %   Lymphs Abs 1.7 0.7 - 4.0 K/uL   Monocytes Relative 6 %   Monocytes Absolute 0.9 0 - 1 K/uL   Eosinophils Relative 0 %   Eosinophils Absolute 0.0 0 - 0 K/uL   Basophils Relative 0 %   Basophils Absolute 0.0 0 - 0 K/uL   Immature Granulocytes 0 %   Abs Immature Granulocytes 0.05 0.00 - 0.07 K/uL    Comment: Performed at [facility], [address] [place], [place] [address]  Magnesium     Status: None   Collection Time: 04/03/20  2:30 AM  Result Value Ref Range   Magnesium 2.2 1.7 - 2.4 mg/dL    Comment: Performed at [facility], [place] [address] [place], [place] [address]  Phosphorus     Status: Abnormal   Collection Time: 04/03/20  2:30 AM  Result Value Ref Range   Phosphorus 4.9 (H) 2.5 - 4.6 mg/dL    Comment: Performed at [facility] [address], [place], [place] [address]  Hemoglobin A1c     Status: Abnormal   Collection Time: 04/03/20  2:30 AM  Result Value Ref Range   Hgb A1c MFr Bld 6.0 (H) 4.8 - 5.6 %    Comment: (NOTE) Pre diabetes:          5.7%-6.4%  Diabetes:              >  6.4%  Glycemic control for   <7.0% adults with diabetes    Mean Plasma Glucose  125.5 mg/dL    Comment: Performed at [facility] [address] [place], [place] [address]  Type and screen [place]     Status: None   Collection Time: 04/03/20  2:32 AM  Result Value Ref Range   ABO/RH(D) O POS    Antibody Screen NEG    Sample Expiration      04/06/2020,2359 Performed at [facility], [place] [address], [place], [place] [address]   ABO/Rh     Status: None   Collection Time: 04/03/20  2:32 AM  Result Value Ref Range   ABO/RH(D)      O POS Performed at [facility] [address], [place], [place] [address]   Glucose, capillary     Status: Abnormal   Collection Time: 04/03/20  3:13 AM  Result Value Ref Range   Glucose-Capillary 203 (H) 70 - 99 mg/dL    Comment: Glucose reference range applies only to samples taken after fasting for at least 8 hours.  I-STAT 7, (LYTES, BLD GAS, ICA, H+H)     Status: Abnormal   Collection Time: 04/03/20  4:54 AM  Result Value Ref Range   pH, Arterial 7.374 7.35 - 7.45   pCO2 arterial 34.9 32 - 48 mmHg   pO2, Arterial 236 (H) 83 - 108 mmHg   Bicarbonate 20.4 20.0 - 28.0 mmol/L   TCO2 21 (L) 22 - 32 mmol/L   O2 Saturation 100.0 %   Acid-base deficit 4.0 (H) 0.0 - 2.0 mmol/L   Sodium 126 (L) 135 - 145 mmol/L   Potassium 3.4 (L) 3.5 - 5.1 mmol/L   Calcium, Ion 1.13 (L) 1.15 - 1.40 mmol/L   HCT 35.0 (L) 36 - 46 %   Hemoglobin 11.9 (L) 12.0 - 15.0 g/dL   Patient temperature 98.5 F    Sample type ARTERIAL   Glucose, capillary     Status: Abnormal   Collection Time: 04/03/20  8:20 AM  Result Value Ref Range   Glucose-Capillary 128 (H) 70 - 99 mg/dL    Comment: Glucose reference range applies only to samples taken after fasting for at least 8 hours.  Basic metabolic panel     Status: Abnormal   Collection Time: 04/03/20  9:47 AM  Result Value Ref Range   Sodium 128 (L) 135 - 145 mmol/L   Potassium 4.2 3.5 - 5.1 mmol/L   Chloride 100 98 - 111 mmol/L   CO2 22 22 - 32 mmol/L   Glucose, Bld  134 (H) 70 - 99 mg/dL    Comment: Glucose reference range applies only to samples taken after fasting for at least 8 hours.   BUN 13 8 - 23 mg/dL   Creatinine, Ser 0.54 0.44 - 1.00 mg/dL   Calcium 8.2 (L) 8.9 - 10.3 mg/dL   GFR calc non Af Amer >60 >60 mL/min   GFR calc Af Amer >60 >60 mL/min   Anion gap 6 5 - 15    Comment: Performed at [facility] [address], [place], [place] [address]  Glucose, capillary     Status: Abnormal   Collection Time: 04/03/20 12:33 PM  Result Value Ref Range   Glucose-Capillary 116 (H) 70 - 99 mg/dL    Comment: Glucose reference range applies only to samples taken after fasting for at least 8 hours.  Basic metabolic panel  Status: Abnormal   Collection Time: 04/03/20  1:49 PM  Result Value Ref Range   Sodium 129 (L) 135 - 145 mmol/L   Potassium 4.2 3.5 - 5.1 mmol/L   Chloride 102 98 - 111 mmol/L   CO2 21 (L) 22 - 32 mmol/L   Glucose, Bld 131 (H) 70 - 99 mg/dL    Comment: Glucose reference range applies only to samples taken after fasting for at least 8 hours.   BUN 17 8 - 23 mg/dL   Creatinine, Ser 0.52 0.44 - 1.00 mg/dL   Calcium 8.2 (L) 8.9 - 10.3 mg/dL   GFR calc non Af Amer >60 >60 mL/min   GFR calc Af Amer >60 >60 mL/min   Anion gap 6 5 - 15    Comment: Performed at [facility] [address], [place], [place] [address]  Glucose, capillary     Status: Abnormal   Collection Time: 04/03/20  3:36 PM  Result Value Ref Range   Glucose-Capillary 106 (H) 70 - 99 mg/dL    Comment: Glucose reference range applies only to samples taken after fasting for at least 8 hours.  Magnesium     Status: None   Collection Time: 04/03/20  5:20 PM  Result Value Ref Range   Magnesium 2.4 1.7 - 2.4 mg/dL    Comment: Performed at [facility], [place] [address] [place], [place] [address]  Phosphorus     Status: None   Collection Time: 04/03/20  5:20 PM  Result Value Ref Range   Phosphorus 3.0 2.5 - 4.6 mg/dL    Comment: Performed at  [facility] [address] [place], [place] [address]  Glucose, capillary     Status: Abnormal   Collection Time: 04/03/20  7:49 PM  Result Value Ref Range   Glucose-Capillary 148 (H) 70 - 99 mg/dL    Comment: Glucose reference range applies only to samples taken after fasting for at least 8 hours.  Glucose, capillary     Status: Abnormal   Collection Time: 04/03/20 11:52 PM  Result Value Ref Range   Glucose-Capillary 110 (H) 70 - 99 mg/dL    Comment: Glucose reference range applies only to samples taken after fasting for at least 8 hours.  Magnesium     Status: None   Collection Time: 04/04/20  2:51 AM  Result Value Ref Range   Magnesium 2.3 1.7 - 2.4 mg/dL    Comment: Performed at [facility], [place] [address] [place], [place] [address]  Phosphorus     Status: None   Collection Time: 04/04/20  2:51 AM  Result Value Ref Range   Phosphorus 2.5 2.5 - 4.6 mg/dL    Comment: Performed at [facility] [address] [place], [place] [address]  Basic metabolic panel     Status: Abnormal   Collection Time: 04/04/20  2:51 AM  Result Value Ref Range   Sodium 131 (L) 135 - 145 mmol/L   Potassium 4.2 3.5 - 5.1 mmol/L   Chloride 104 98 - 111 mmol/L   CO2 20 (L) 22 - 32 mmol/L   Glucose, Bld 121 (H) 70 - 99 mg/dL    Comment: Glucose reference range applies only to samples taken after fasting for at least 8 hours.   BUN 18 8 - 23 mg/dL   Creatinine, Ser 0.47 0.44 - 1.00 mg/dL   Calcium 8.3 (L) 8.9 - 10.3 mg/dL   GFR calc non Af  Amer >60 >60 mL/min   GFR calc Af Amer >60 >60 mL/min   Anion gap 7 5 - 15    Comment: Performed at [facility] [address] [place], [place] [address]  CBC     Status: None   Collection Time: 04/04/20  2:51 AM  Result Value Ref Range   WBC 9.9 4.0 - 10.5 K/uL   RBC 4.89 3.87 - 5.11 MIL/uL   Hemoglobin 14.6 12.0 - 15.0 g/dL   HCT 44.6 36 - 46 %   MCV 91.2 80.0 - 100.0 fL   MCH 29.9 26.0 - 34.0 pg   MCHC 32.7 30.0 - 36.0 g/dL    RDW 12.7 11.5 - 15.5 %   Platelets 180 150 - 400 K/uL   nRBC 0.0 0.0 - 0.2 %    Comment: Performed at [facility], [place] [address] [place], [place] [address]  Glucose, capillary     Status: Abnormal   Collection Time: 04/04/20  3:45 AM  Result Value Ref Range   Glucose-Capillary 133 (H) 70 - 99 mg/dL    Comment: Glucose reference range applies only to samples taken after fasting for at least 8 hours.  Glucose, capillary     Status: Abnormal   Collection Time: 04/04/20  7:38 AM  Result Value Ref Range   Glucose-Capillary 122 (H) 70 - 99 mg/dL    Comment: Glucose reference range applies only to samples taken after fasting for at least 8 hours.    Recent Results (from the past 240 hour(s))  SARS Coronavirus 2 by RT PCR (hospital order, performed in [facility] hospital lab) Nasopharyngeal Nasopharyngeal Swab     Status: None   Collection Time: 03/27/20  1:55 PM   Specimen: Nasopharyngeal Swab  Result Value Ref Range Status   SARS Coronavirus 2 NEGATIVE NEGATIVE Final    Comment: (NOTE) SARS-CoV-2 target nucleic acids are NOT DETECTED.  The SARS-CoV-2 RNA is generally detectable in upper and lower respiratory specimens during the acute phase of infection. The lowest concentration of SARS-CoV-2 viral copies this assay can detect is 250 copies / mL. A negative result does not preclude SARS-CoV-2 infection and should not be used as the sole basis for treatment or other patient management decisions.  A negative result may occur with improper specimen collection / handling, submission of specimen other than nasopharyngeal swab, presence of viral mutation(s) within the areas targeted by this assay, and inadequate number of viral copies (<250 copies / mL). A negative result must be combined with clinical observations, patient history, and epidemiological information.  Fact Sheet for Patients:   StrictlyIdeas.no  Fact Sheet for Healthcare  Providers: BankingDealers.co.za  This test is not yet approved or  cleared by the [place] FDA and has been authorized for detection and/or diagnosis of SARS-CoV-2 by FDA under an Emergency Use Authorization (EUA).  This EUA will remain in effect (meaning this test can be used) for the duration of the COVID-19 declaration under Section 564(b)(1) of the Act, 21 U.S.C. section 360bbb-3(b)(1), unless the authorization is terminated or revoked sooner.  Performed at [facility], [address] [place], [place] [address]     Lipid Panel No results for input(s): CHOL, TRIG, HDL, CHOLHDL, VLDL, LDLCALC in the last 72 hours.  Studies/Results: DG Chest Port 1 View  Result Date: 04/04/2020 CLINICAL DATA:  Shortness of breath EXAM: PORTABLE CHEST 1 VIEW COMPARISON:  April 03, 2020 FINDINGS: The mediastinal contour and cardiac silhouette are stable.  Endotracheal tube is identified with distal tip 4.7 cm from carina. Nasogastric tube is identified with distal tip not included on film but is at least in the stomach. Cardiac pacemaker is unchanged. Linear opacities are identified in bilateral lung bases. Probable small left pleural effusion is identified. The bony structures are stable. IMPRESSION: Linear opacities in bilateral lung bases, this may be secondary to atelectasis but underlying pneumonia is not excluded. Electronically Signed   By: Abelardo Diesel M.D.   On: 04/04/2020 07:56   DG CHEST PORT 1 VIEW  Result Date: 04/03/2020 CLINICAL DATA:  Shortness of breath EXAM: PORTABLE CHEST 1 VIEW COMPARISON:  Radiograph 03/29/2020 FINDINGS: Endotracheal tube low within the trachea, 2.9 cm from the carina. Pacer pack overlies the left chest wall with leads directed towards the right atrium and cardiac apex. Telemetry leads overlie the chest. Additional support devices overlie the right shoulder. Increasing patchy opacities are seen in the infrahilar lungs with some central  vascular congestion. No pneumothorax or visible pleural effusion. Cardiomediastinal contours are stable from prior accounting for differences in technique with a calcified aorta. No acute osseous or soft tissue abnormality. Degenerative changes are present in the imaged spine and shoulders. IMPRESSION: 1. Endotracheal tube low within the trachea, 2.9 cm from the carina. Could consider retraction 1-2 cm to the mid trachea. 2. Increasing patchy opacities in the infrahilar lungs with some central vascular congestion, could reflect developing edema or infection. Electronically Signed   By: Lovena Le M.D.   On: 04/03/2020 03:19   DG Abd Portable 1V  Result Date: 04/03/2020 CLINICAL DATA:  Initial evaluation for enteric tube placement. EXAM: PORTABLE ABDOMEN - 1 VIEW COMPARISON:  None. FINDINGS: Enteric tube in place with tip seen overlying the first-second portion of the duodenum, side hole well beyond the GE junction at the level of the distal stomach/pylorus. Multiple scattered gas-filled loops of bowel noted within the visualized abdomen. Thoracolumbar scoliosis noted. IMPRESSION: Tip of enteric tube overlying the first-second portion of the duodenum, side hole well beyond the GE junction at the level of the distal stomach/pylorus. Electronically Signed   By: Jeannine Boga M.D.   On: 04/03/2020 04:53   EEG adult  Result Date: 04/03/2020 Philemon Kingdom, MD     04/03/2020  7:31 PM Date of Study: 04/03/2020 Reason for Study:  Pt is a 33 YOF with PMH of HTN, HLD, CAD, PSORIASIS/ARTHRITIS who has developed weakness/numbness/tingling, hyponatremia and had possible seizure like event.  Eval for seizures. Description of recording: This recording was obtained using a Digital EEG system with 18 channel capacity . Standard bipolar and referential EEG montages were used following the International 10 - 20 System . This is a digitally recorded EEG with duration of approximately ~23:53 minutes. The background  consists of 4-6 hz delta/theta rhythm throughout study.  There were no epileptiform discharges or electrographic seizures noted. Hyperventilation was not performed. Photic stimulation was not performed. Sleep did not occur in this recording. IMPRESSION: This is an abnormal EEG for age due to diffuse background slowing which can be seen with toxic/metabolic encephalopathy, cerebral ischemia, intracerebral hemorrhage, tumors, traumatic brain injury, cortical malformations, or other cerebral dysfunction. There were no clinical seizures or epileptiform discharges noted during study. Clinical correlation is advised. I have personally reviewed this study.   Medications:  Scheduled: . chlorhexidine gluconate (MEDLINE KIT)  15 mL Mouth Rinse BID  . Chlorhexidine Gluconate Cloth  6 each Topical Daily  . docusate  100 mg Per Tube BID  .  enoxaparin  40 mg Subcutaneous Daily  . feeding supplement (PRO-STAT SUGAR FREE 64)  30 mL Per Tube Daily  . insulin aspart  0-15 Units Subcutaneous Q4H  . mouth rinse  15 mL Mouth Rinse 10 times per day  . pantoprazole sodium  40 mg Per Tube Daily  . polyethylene glycol  17 g Oral Daily   Continuous: . sodium chloride 30 mL/hr at 04/04/20 0700  . dexmedetomidine (PRECEDEX) IV infusion 1.1 mcg/kg/hr (04/04/20 0700)  . feeding supplement (VITAL AF 1.2 CAL) 1,000 mL (04/03/20 1153)  . levETIRAcetam Stopped (04/03/20 2142)  . phenylephrine (NEO-SYNEPHRINE) Adult infusion Stopped (04/04/20 0554)   Imaging studies:  CT head and CT angiogram of head and neck 03/27/2020:  No significant acute intracranial abnormality. 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 female with a past medical history of CAD, pacemaker placement, hypertension, hyperlipidemia, psoriasis and arthritis, who was transferred from [facility] hospital  where she was being followed by neurology for progressive numbness and weakness of both lower and upper extremities with diminished reflexes and CSF negative for albumino-cytological dissociation. 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 She was started on IVIG, which was later discontinued due to hyponatremia. Transferred to [facility] for spinal imaging which cannot be done at [facility] due to pacemaker. She had a spell of unresponsiveness on arrival to [facility] thought to be most consistent with a seizure and was loaded with Keppra, with subsequent EEG showing no electrographic seizures - Na of 119 was felt to be the most likely precipitating factor.  -- Exam today reveals slightly improved muscle strength. DTRs still absent. Mentation is unchanged. -- AMS: EEG yesterday (7/3) was abnormal EEG for age due to diffuse background slowing which can be seen with toxic/metabolic encephalopathy, cerebral ischemia, intracerebral hemorrhage, tumors, traumatic brain injury, cortical malformations, or other cerebral dysfunction. There were no clinical seizures or epileptiform discharges noted during study.  --  Ascending weakness and sensory disturbance.   -- DDx includes transverse myelitis and GBS with normal CSF protein (normal CSF profile can be found in 10 % of GBS patients throughout the disease. Therefore normal values cannot rule out GBS).   -- MRI brain, cervical, thoracic and lumbar spine with and without contrast pending. These studies are needed to rule out brainstem (less likely) and/or spinal cord pathology (transverse myelitis) as well as to assess for possible nerve root enhancement with thickening (GBS).  -- Hyponatremia:   -- Na today is improved to 131  -- Per the literature, 11.8% of patients with GBS experience hyponatremia, compared to 4% in non-GBS patients (Hyponatremia in Guillain-Barr Syndrome. Kavelin Rumalla et al, J Clin Neuromuscul Dis 2017).   -- Of note, IVIG can  also result in pseudohyponatremia.  -- DDx: Demyelination versus spinal cord infarct versus a high cervical lesion versus GBS could all be in the differentials for her current presentation.  Recommendations: -- MRI will need to be done on Monday due to IR tech being unavailable over the weekend to reset the patient's pacemaker.  -- Continue Keppra at 500 mg BID for now. We do not think she will need Keppra in the longer term because the seizure probably was provoked by severe acute hyponatremia. -- For hyponatremia, avoid more than 10 point correction in 24 hours to avoid any risk of osmotic demyelination. We agree with holding off on IVIG for now although  we are not really sure if IVIG truly was the culprit for the hyponatremia. 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  -- Would benefit from the diagnostic information obtained from EMG/NCS, which unfortunately is unavailable at [facility]. May benefit from transfer to [facility] or [facility] for inpatient EMG/NCS to guide further work up and treatment.   50 minutes spent in the neurological evaluation and management of this critically ill patient.    LOS: 1 day   @Electronically  signed: Dr. Kerney Elbe 04/04/2020  8:25 AM

## 2020-04-05 DIAGNOSIS — R531 Weakness: Secondary | ICD-10-CM

## 2020-04-05 LAB — BASIC METABOLIC PANEL
Anion gap: 6 (ref 5–15)
BUN: 20 mg/dL (ref 8–23)
CO2: 22 mmol/L (ref 22–32)
Calcium: 8.1 mg/dL — ABNORMAL LOW (ref 8.9–10.3)
Chloride: 105 mmol/L (ref 98–111)
Creatinine, Ser: 0.5 mg/dL (ref 0.44–1.00)
GFR calc Af Amer: >60 mL/min (ref >60)
GFR calc non Af Amer: >60 mL/min (ref >60)
Glucose, Bld: 133 mg/dL — ABNORMAL HIGH (ref 70–99)
Potassium: 3.6 mmol/L (ref 3.5–5.1)
Sodium: 133 mmol/L — ABNORMAL LOW (ref 135–145)

## 2020-04-05 LAB — GLUCOSE, CAPILLARY
Glucose-Capillary: 110 mg/dL — ABNORMAL HIGH (ref 70–99)
Glucose-Capillary: 123 mg/dL — ABNORMAL HIGH (ref 70–99)
Glucose-Capillary: 130 mg/dL — ABNORMAL HIGH (ref 70–99)
Glucose-Capillary: 138 mg/dL — ABNORMAL HIGH (ref 70–99)
Glucose-Capillary: 150 mg/dL — ABNORMAL HIGH (ref 70–99)
Glucose-Capillary: 92 mg/dL (ref 70–99)

## 2020-04-05 NOTE — Plan of Care (Signed)

## 2020-04-05 NOTE — Progress Notes (Signed)
MRI pending, she did not finish the IVIG course, but got 4/5 doses and has had some improvement.   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   Enbrel is associated with demyelinating neuroapthy and the timing is certainly suspicious for an association.   At this time, if MRI is negative will consider finishing IVIG vs. PLEX.   Roland Rack, MD [facility] [phone]  If 7pm- 7am, please page neurology on call as listed in [place].

## 2020-04-05 NOTE — Progress Notes (Signed)
NAME:  Alison Huffman, MRN:  [id number], DOB:  02/02/1941, LOS: 2 ADMISSION DATE:  04/03/2020, CONSULTATION DATE:  04/03/2020 REFERRING MD:  Dr. Hal Hope, [facility], CHIEF COMPLAINT:  Respiratory failure   Brief History   79 yo female former smoker transferred to [facility] from [facility] on 6/26 with weakness, numbness, and tingling.  She was recently started on enbrel for psoriasis.  Also developed progressive hyponatremia.  Transferred to [facility] on 7/03 for further neurologic assessment.  Required intubation for airway protection with concern for seizure and transferred to ICU.  Hx from chart and medical staff.  Past Medical History  CAD, s/p PM, GERD, HTN, HLD, Psoriasis, Arthritis, OSA  Significant Hospital Events   6/26 Admit to [facility] and seen by neurology 6/28 start IVIG 7/02 seen by nephrology at [facility] >> d/c IVIG due to concern about this causing dilutional hyponatremia 7/03 transfer from 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 Tolerating 5/5 PSV well.   Objective   Blood pressure (!) 111/47, pulse 63, temperature 98.6 F (37 C), temperature source Oral, resp. rate 16, weight 78.5 kg, SpO2 98 %.    Vent Mode: PSV;CPAP FiO2 (%):  [40 %-50 %] 40 % Set Rate:  [18 bmp] 18 bmp Vt Set:  [470 mL] 470 mL PEEP:  [5 cmH20] 5 cmH20 Plateau Pressure:  [9 cmH20-19 cmH20] 13 cmH20    Intake/Output Summary (Last 24 hours) at 04/05/2020 1010 Last data filed at 04/05/2020 1000 Gross per 24 hour  Intake 1681.79 ml  Output 990 ml  Net 691.79 ml   Filed Weights   04/03/20 0459 04/04/20 0500 04/05/20 0406  Weight: 76 kg 79.5 kg 78.5 kg    Examination: General:  Elderly female on vent Neuro:  Awake, alert, follows commands. All extremities equally weak 4/5. Barely able to lift head off pillow.  HEENT:  [place]/AT, No JVD noted, PERRL Cardiovascular:  RRR, no MRG Lungs:  Clear bilaterally.  Abdomen:  Soft, non-distended Musculoskeletal:  No acute deformity Skin:  Intact, MMM  Resolved Hospital Problem list     Assessment & Plan:   Acute hypoxic respiratory failure with compromised airway 2nd to seizure in setting of hyponatremia and progressive weakness. Hx of OSA. - Full vent support, daily SBT. Tolerating today.  - Reasonable to consider extubation, but her weakness may cause her to fail. Considering pending MRI, perhaps prudent to await MRI prior to extubation.  - VAP bundle  Numbness/weakness progressing from lower extremities up: no improvement with IVIG - MRI brain, C/T/L spine tomorrow -pt still profoundly weak, unsure if she will tolerate mri without ett so will cont with sbt but maintain vent until MRI completion -appreciate neuro  Seizure in setting of hyponatremia:-eeg without sz - Keppra -repeat bmp in am - Neurology following  Hyperglycemia. - SSI  Hypotension after intubation. -off pressors  Chronic HFpEF:  -no acute exacerbation  Hx of  HTN, HLD. - hold outpt crestor, diovan  Hx of psoriasis. - hold outpt enbrel  Urinary retention:  -foley  Best practice:  Diet: tube feeds DVT prophylaxis: lovenox GI prophylaxis: protonix Mobility: bed rest Code Status: full code Disposition: ICU Family communication: updated niece at bedside 7/5  Labs   CBC: Recent Labs  Lab 03/31/20 0415 03/31/20 0415 04/01/20 0443 04/02/20 0609  04/03/20 0230 04/03/20 0454 04/04/20 0251  WBC 6.1  --  5.3 8.5 15.5*  --  9.9  NEUTROABS  --   --   --   --  12.8*  --   --   HGB 12.6   < > 12.3 13.2 14.6 11.9* 14.6  HCT 37.7   < > 35.9* 36.6 44.9 35.0* 44.6  MCV 90.0  --  88.6 85.5 91.3  --  91.2  PLT 215  --  209 222 237  --  180   < > = values in this interval not displayed.    Basic Metabolic Panel: Recent Labs  Lab 04/02/20 0609 04/02/20 0609 04/03/20 0230 04/03/20 0230 04/03/20 0454 04/03/20 0947 04/03/20 1349 04/03/20 1720 04/04/20 0251 04/04/20 1706 04/05/20 0309  NA 119*   < > 119*   < > 126* 128* 129*  --  131*  --  133*  K 3.8   < > 4.6   < > 3.4* 4.2 4.2  --  4.2  --  3.6  CL 89*   < > 89*  --   --  100 102  --  104  --  105  CO2 22   < > 19*  --   --  22 21*  --  20*  --  22  GLUCOSE 104*   < > 244*  --   --  134* 131*  --  121*  --  133*  BUN 12   < > 12  --   --  13 17  --  18  --  20  CREATININE 0.43*   < > 0.61  --   --  0.54 0.52  --  0.47  --  0.50  CALCIUM 8.0*   < > 8.3*  --   --  8.2* 8.2*  --  8.3*  --  8.1*  MG 2.1  --  2.2  --   --   --   --  2.4 2.3 2.1  --   PHOS  --   --  4.9*  --   --   --   --  3.0 2.5 2.6  --    < > = values in this interval not displayed.   GFR: Estimated Creatinine Clearance: 59.3 mL/min (by C-G formula based on SCr of 0.5 mg/dL). Recent Labs  Lab 04/01/20 0443 04/02/20 0609 04/03/20 0230 04/04/20 0251  WBC 5.3 8.5 15.5* 9.9    Liver Function Tests: Recent Labs  Lab 04/02/20 0609 04/03/20 0230  AST 54* 82*  ALT 41 63*  ALKPHOS 64 85  BILITOT 0.7 1.3*  PROT 8.8* 9.0*  ALBUMIN 2.9* 3.0*   No results for input(s): LIPASE, AMYLASE in the last 168 hours. No results for input(s): AMMONIA in the last 168 hours.  ABG    Component Value Date/Time   PHART 7.374 04/03/2020 0454   PCO2ART 34.9 04/03/2020 0454   PO2ART 236 (H) 04/03/2020 0454   HCO3 20.4 04/03/2020 0454   TCO2 21 (L) 04/03/2020 0454   ACIDBASEDEF 4.0 (H) 04/03/2020 0454   O2SAT 100.0  04/03/2020 0454     Coagulation Profile: No results for input(s): INR, PROTIME in the last 168 hours.  Cardiac Enzymes: No results for input(s): CKTOTAL, CKMB, CKMBINDEX, TROPONINI in the last 168 hours.  HbA1C: Hgb A1c MFr Bld  Date/Time Value Ref Range Status  04/03/2020 02:30 AM 6.0 (H) 4.8 - 5.6 % Final    Comment:    (NOTE) Pre diabetes:          5.7%-6.4%  Diabetes:              >6.4%  Glycemic control for   <7.0% adults with diabetes   03/24/2020 02:38 PM 6.1 4.6 - 6.5 % Final    Comment:    Glycemic Control Guidelines for People with Diabetes:Non Diabetic:  <6%Goal of Therapy: <7%Additional Action Suggested:  >8%     CBG: Recent Labs  Lab 04/04/20 1521 04/04/20 2002 04/05/20 0015 04/05/20 0338 04/05/20 0748  GLUCAP 124* 134* 138* 150* 110*   Critical care time 30 mins  Georgann Housekeeper, AGACNP-BC [place] for personal pager PCCM on call pager [phone]  04/05/2020 10:25 AM

## 2020-04-06 ENCOUNTER — Inpatient Hospital Stay (HOSPITAL_COMMUNITY): Payer: Medicare Other

## 2020-04-06 DIAGNOSIS — L8992 Pressure ulcer of unspecified site, stage 2: Secondary | ICD-10-CM | POA: Insufficient documentation

## 2020-04-06 DIAGNOSIS — G610 Guillain-Barre syndrome: Secondary | ICD-10-CM

## 2020-04-06 LAB — POCT I-STAT 7, (LYTES, BLD GAS, ICA,H+H)
Acid-Base Excess: 0 mmol/L (ref 0.0–2.0)
Bicarbonate: 26.3 mmol/L (ref 20.0–28.0)
Calcium, Ion: 1.26 mmol/L (ref 1.15–1.40)
HCT: 33 % — ABNORMAL LOW (ref 36.0–46.0)
Hemoglobin: 11.2 g/dL — ABNORMAL LOW (ref 12.0–15.0)
O2 Saturation: 96 %
Patient temperature: 100.1
Potassium: 4 mmol/L (ref 3.5–5.1)
Sodium: 139 mmol/L (ref 135–145)
TCO2: 28 mmol/L (ref 22–32)
pCO2 arterial: 48.2 mmHg — ABNORMAL HIGH (ref 32.0–48.0)
pH, Arterial: 7.349 — ABNORMAL LOW (ref 7.350–7.450)
pO2, Arterial: 88 mmHg (ref 83.0–108.0)

## 2020-04-06 LAB — BASIC METABOLIC PANEL
Anion gap: 7 (ref 5–15)
BUN: 17 mg/dL (ref 8–23)
CO2: 25 mmol/L (ref 22–32)
Calcium: 8.4 mg/dL — ABNORMAL LOW (ref 8.9–10.3)
Chloride: 104 mmol/L (ref 98–111)
Creatinine, Ser: 0.42 mg/dL — ABNORMAL LOW (ref 0.44–1.00)
GFR calc Af Amer: >60 mL/min (ref >60)
GFR calc non Af Amer: >60 mL/min (ref >60)
Glucose, Bld: 135 mg/dL — ABNORMAL HIGH (ref 70–99)
Potassium: 3.6 mmol/L (ref 3.5–5.1)
Sodium: 136 mmol/L (ref 135–145)

## 2020-04-06 LAB — CBC
HCT: 33.3 % — ABNORMAL LOW (ref 36.0–46.0)
Hemoglobin: 11 g/dL — ABNORMAL LOW (ref 12.0–15.0)
MCH: 30.8 pg (ref 26.0–34.0)
MCHC: 33 g/dL (ref 30.0–36.0)
MCV: 93.3 fL (ref 80.0–100.0)
Platelets: 164 10*3/uL (ref 150–400)
RBC: 3.57 MIL/uL — ABNORMAL LOW (ref 3.87–5.11)
RDW: 13.2 % (ref 11.5–15.5)
WBC: 8.3 10*3/uL (ref 4.0–10.5)
nRBC: 0 % (ref 0.0–0.2)

## 2020-04-06 LAB — GLUCOSE, CAPILLARY
Glucose-Capillary: 107 mg/dL — ABNORMAL HIGH (ref 70–99)
Glucose-Capillary: 114 mg/dL — ABNORMAL HIGH (ref 70–99)
Glucose-Capillary: 115 mg/dL — ABNORMAL HIGH (ref 70–99)
Glucose-Capillary: 131 mg/dL — ABNORMAL HIGH (ref 70–99)
Glucose-Capillary: 131 mg/dL — ABNORMAL HIGH (ref 70–99)
Glucose-Capillary: 92 mg/dL (ref 70–99)

## 2020-04-06 MED ORDER — SODIUM CHLORIDE 0.9% FLUSH
10.0000 mL | Freq: Three times a day (TID) | INTRAVENOUS | Status: DC
  Administered 2020-04-06 – 2020-04-13 (×14): 10 mL

## 2020-04-06 MED ORDER — LORAZEPAM 2 MG/ML IJ SOLN
INTRAMUSCULAR | Status: AC
  Administered 2020-04-06: 2 mg via INTRAVENOUS
  Filled 2020-04-06: qty 1

## 2020-04-06 MED ORDER — IPRATROPIUM-ALBUTEROL 0.5-2.5 (3) MG/3ML IN SOLN
RESPIRATORY_TRACT | Status: AC
  Administered 2020-04-06: 10 mL
  Filled 2020-04-06: qty 12

## 2020-04-06 MED ORDER — LORAZEPAM 2 MG/ML IJ SOLN: 2.0000 mg | Freq: Once | INTRAMUSCULAR | Status: AC

## 2020-04-06 MED ORDER — NOREPINEPHRINE 4 MG/250ML-% IV SOLN
0.0000 ug/min | INTRAVENOUS | Status: DC
  Administered 2020-04-06: 2 ug/min via INTRAVENOUS
  Filled 2020-04-06: qty 250

## 2020-04-06 MED ORDER — DEXMEDETOMIDINE HCL IN NACL 400 MCG/100ML IV SOLN
0.4000 ug/kg/h | INTRAVENOUS | Status: DC
  Administered 2020-04-06 (×2): 0.8 ug/kg/h via INTRAVENOUS
  Administered 2020-04-07: 0.6 ug/kg/h via INTRAVENOUS
  Administered 2020-04-07: 0.7 ug/kg/h via INTRAVENOUS
  Administered 2020-04-07: 0.4 ug/kg/h via INTRAVENOUS
  Administered 2020-04-08 – 2020-04-09 (×3): 0.7 ug/kg/h via INTRAVENOUS
  Administered 2020-04-09: 0.5 ug/kg/h via INTRAVENOUS
  Administered 2020-04-09: 0.7 ug/kg/h via INTRAVENOUS
  Administered 2020-04-10 – 2020-04-11 (×3): 0.5 ug/kg/h via INTRAVENOUS
  Filled 2020-04-06 (×15): qty 100
  Filled 2020-04-06: qty 200

## 2020-04-06 MED ORDER — SODIUM CHLORIDE 0.9 % IV BOLUS
500.0000 mL | Freq: Once | INTRAVENOUS | Status: AC
  Administered 2020-04-06: 500 mL via INTRAVENOUS

## 2020-04-06 NOTE — Progress Notes (Signed)
Informed of MRI for today.   Device system confirmed to be MRI conditional, with implant date > 6 weeks ago and no evidence of abandoned or epicardial leads in review of most recent CXR Interrogation from today reviewed, pt is currently AS-VS at ~68 bpm Device is MRI safe and does not require any changes for MRI. Reviewed with [place] with [place].   Program device back to pre-MRI settings after completion of exam.  Shirley Friar, Hershal Coria  04/06/2020

## 2020-04-06 NOTE — Progress Notes (Signed)
Niece [name]) called and updated on patients status. Reviewed process of bilateral chest tube placement and CXR in between.    She requested to hear from Neurology if they have new information on plan of care.      Noe Gens, MSN, NP-C [facility] 04/06/2020, 5:00 PM   Please see [email or web address] for pager details.

## 2020-04-06 NOTE — Progress Notes (Addendum)
NAME:  Alison Huffman, MRN:  [id number], DOB:  14-Feb-1941, LOS: 3 ADMISSION DATE:  04/03/2020, CONSULTATION DATE:  04/03/2020 REFERRING MD:  Dr. Hal Hope, [facility], CHIEF COMPLAINT:  Respiratory failure   Brief History   79 yo female former smoker transferred to [facility] from [facility] [place] on 6/26 with weakness, numbness, and tingling.  She was recently started on enbrel for psoriasis.  Also developed progressive hyponatremia.  Transferred to [facility] on 7/03 for further neurologic assessment.  Required intubation for airway protection with concern for seizure and transferred to ICU.  Hx from chart and medical staff.  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 Tolerating 5/5 PSV well.   Objective   Blood pressure (!) 133/54, pulse 68, temperature (!) 97.4 F (36.3 C), temperature source Axillary, resp. rate (!) 22, weight 78.4 kg, SpO2 100 %.    Vent Mode: PRVC FiO2 (%):  [40 %] 40 % Set Rate:  [18 bmp] 18 bmp Vt Set:  [470 mL] 470 mL PEEP:  [5 cmH20] 5 cmH20 Pressure Support:  [5 cmH20] 5 cmH20 Plateau  Pressure:  [15 cmH20-17 cmH20] 17 cmH20   Intake/Output Summary (Last 24 hours) at 04/06/2020 1105 Last data filed at 04/06/2020 1100 Gross per 24 hour  Intake 1548.36 ml  Output 860 ml  Net 688.36 ml   Filed Weights   04/04/20 0500 04/05/20 0406 04/06/20 0323  Weight: 79.5 kg 78.5 kg 78.4 kg    Examination: General:  elderly female lying supine in bed, ventilation, does not appear to be in acute distress  Neuro:  Awakens easily to voice, follows all commands, 2/5 grip strength HEENT:  MMM  Cardiovascular: Regular rate, cardiac pacemaker in place  Lungs:  Clear to auscultation, without wheezing or crackles, ventilated   Abdomen:  Soft with bowel sounds present throughout  Skin: no rashes noted   Resolved Hospital Problem list   Requirement for vasopressors  Hyponatremia   Assessment & Plan:   Acute hypoxic respiratory failure with compromised airway 2nd to seizure in setting of hyponatremia and progressive weakness. Hx of OSA. - Full vent support, Tolerating today - consider extubation; however concern for her weakness may cause her to fail extubation, will await results of MRI before moving forward with extubation  - VAP bundle  Numbness/weakness progressing from lower extremities up:  no improvement with IVIG. Patient able to follow commands and move upper and lower extremities. Weak movements - MRI brain, C/T/L spine 7/6 -appreciate neuro recommendations, considering IVIG vs PLEX depending on MRI results   Seizure in setting of hyponatremia:  no recent seizure activity, NA 136 -  EEG without sz, diffuse slowing  - monitor Na with bmp - Neurology following  Hyperglycemia, CBG 131 - moderate SSI - CBG monitoring   Hypotension after intubation -no longer requiring vasopressors   Chronic HFpEF:  -no acute exacerbation  Hx of HTN, HLD. - hold outpt crestor, diovan  Hx of psoriasis. - hold outpt enbrel  Urinary retention:  -foley  Best practice:  Diet: tube  feeds DVT prophylaxis: lovenox GI prophylaxis: protonix Mobility: bed rest Code Status: full code Disposition: ICU Family communication: updated niece via telephone 7/6; family requests update on MRI results when available   Labs   CBC: Recent Labs  Lab 04/01/20 0443 04/01/20 0443 04/02/20 0609 04/03/20 0230 04/03/20 0454 04/04/20 0251 04/06/20 0418  WBC 5.3  --  8.5 15.5*  --  9.9 8.3  NEUTROABS  --   --   --  12.8*  --   --   --   HGB 12.3   < > 13.2 14.6 11.9* 14.6 11.0*  HCT 35.9*   < > 36.6 44.9 35.0* 44.6 33.3*  MCV 88.6  --  85.5 91.3  --  91.2 93.3  PLT 209  --  222 237  --  180 164   < > = values in this interval not displayed.    Huffman Metabolic Panel: Recent Labs  Lab 04/02/20 0609 04/02/20 0609 04/03/20 0230 04/03/20 0454 04/03/20 0947 04/03/20 1349 04/03/20 1720 04/04/20 0251 04/04/20 1706 04/05/20 0309 04/06/20 0418  NA 119*   < > 119*   < > 128* 129*  --  131*  --  133* 136  K 3.8   < > 4.6   < > 4.2 4.2  --  4.2  --  3.6 3.6  CL 89*   < > 89*   < > 100 102  --  104  --  105 104  CO2 22   < > 19*   < > 22 21*  --  20*  --  22 25  GLUCOSE 104*   < > 244*   < > 134* 131*  --  121*  --  133* 135*  BUN 12   < > 12   < > 13 17  --  18  --  20 17  CREATININE 0.43*   < > 0.61   < > 0.54 0.52  --  0.47  --  0.50 0.42*  CALCIUM 8.0*   < > 8.3*   < > 8.2* 8.2*  --  8.3*  --  8.1* 8.4*  MG 2.1  --  2.2  --   --   --  2.4 2.3 2.1  --   --   PHOS  --   --  4.9*  --   --   --  3.0 2.5 2.6  --   --    < > = values in this interval not displayed.   GFR: Estimated Creatinine Clearance: 59.2 mL/min (A) (by C-G formula based on SCr of 0.42 mg/dL (L)). Recent Labs  Lab 04/02/20 0609 04/03/20 0230 04/04/20 0251 04/06/20 0418  WBC 8.5 15.5* 9.9 8.3    Liver Function Tests: Recent Labs  Lab 04/02/20 0609 04/03/20 0230  AST 54* 82*  ALT 41 63*  ALKPHOS 64 85  BILITOT 0.7 1.3*  PROT 8.8* 9.0*  ALBUMIN 2.9* 3.0*    ABG    Component Value Date/Time     PHART 7.374 04/03/2020 0454   PCO2ART 34.9 04/03/2020 0454  PO2ART 236 (H) 04/03/2020 0454   HCO3 20.4 04/03/2020 0454   TCO2 21 (L) 04/03/2020 0454   ACIDBASEDEF 4.0 (H) 04/03/2020 0454   O2SAT 100.0 04/03/2020 0454     HbA1C: Hgb A1c MFr Bld  Date/Time Value Ref Range Status  04/03/2020 02:30 AM 6.0 (H) 4.8 - 5.6 % Final    Comment:    (NOTE) Pre diabetes:          5.7%-6.4%  Diabetes:              >6.4%  Glycemic control for   <7.0% adults with diabetes   03/24/2020 02:38 PM 6.1 4.6 - 6.5 % Final    Comment:    Glycemic Control Guidelines for People with Diabetes:Non Diabetic:  <6%Goal of Therapy: <7%Additional Action Suggested:  >8%     CBG: Recent Labs  Lab 04/05/20 1558 04/05/20 2015 04/06/20 0006 04/06/20 0329 04/06/20 0807  GLUCAP 123* 92 131* 115* 131*   Critical care time 30 mins  Eulis Foster, MD [facility], PGY-2  04/06/2020 11:05 AM

## 2020-04-06 NOTE — Progress Notes (Signed)
Pt was in MRI, about 1.5 hr into the scanning, pt started triggering ventilator. Pt was pulled out of scanner, vital sings were stable, pulse present. Fentanyl 50 mcg IV was given and pt assessed further. Pt had a blank stare and no response to painful stimuli. Rapid response called, followed by Code Blue. Pt's nurse is notified and patient was transferred back to the floor.

## 2020-04-06 NOTE — Progress Notes (Signed)
Pt emergently taken from MRI due to respiratory distress. Pt placed on her regular vent and VT < 100. Pt taken off vent and manually ventilated. Pt extremely hard to bag. Pt never lost pulse but was unable to ventilate. Code Blue called. Pt continued to be manually ventilated back to 3M11  And then was placed on ventilator. 10 mg Duoneb given through aerogen due to possible bronchospasms. Pt stable at this time, RT will continue to closely monitor pt at this time

## 2020-04-06 NOTE — Code Documentation (Addendum)
Rapid Response Note  Time called: 1300 Time arrived: 1303  Called to MRI for pt having had an acute mental status change and mottling. MRI scan was paused due to RN noting pt was double stacking breaths on the ventilator. Upon assessment, RN noted pt to be unresponsive. Pt mottled upon my arrival, minimal air movement heard throughout. Eyes were open, no blink response to threat. Pt had a palpable pulse. Pt was not pulling good volumes, therefore RT began manually ventilating pt. Pt was extremely hard to bag. Pt face began to turn blue. Oxygen saturation was poorly reading, picking up at 85%. Pt attached to [place]. Pt pulse remained palpable, was irregular and began slowing down from 80s bpm to 60s bpm. Code blue initiated at approximately 1309. At this time PCCM MD arrived to bedside and was able to manually ventilate pt with her current ETT tube, oxygen saturation up to 100% and pt color became pink. Heart rate remained palpable during event. BP 179/82, HR 109, SpO2 100%. Pt was determined stable for transport and transported back to ICU by PCCM MD, RT, and RR RN. Hand-off given to L. Flood, Therapist, sports.   [place] Yanelly Cantrelle Rapid Response RN

## 2020-04-06 NOTE — Telephone Encounter (Signed)
Okay to cancel- can schedule once she has recovered. I believe they will let us know.

## 2020-04-06 NOTE — Procedures (Signed)
Insertion of Chest Tube Procedure Note  Alison Huffman  [id number]  March 23, 1941  Date:04/06/20  Time:5:01 PM    Provider Performing: Cristal Generous   Procedure: Pleural Catheter Insertion w/ Imaging Guidance [phone])  Indication(s) Pneumothorax  Consent Risks of the procedure as well as the alternatives and risks of each were explained to the patient and/or caregiver.  Consent for the procedure was obtained and is signed in the bedside chart  Anesthesia Topical only with 1% lidocaine  76mcg Fentanyl IV   Time Out Verified patient identification, verified procedure, site/side was marked, verified correct patient position, special equipment/implants available, medications/allergies/relevant history reviewed, required imaging and test results available.   Sterile Technique Maximal sterile technique including full sterile barrier drape, hand hygiene, sterile gown, sterile gloves, mask, hair covering, sterile ultrasound probe cover (if used).   Procedure Description Ultrasound used to identify appropriate pleural anatomy for placement and overlying skin marked. Area of placement cleaned and draped in sterile fashion.  A 14 French pigtail pleural catheter was placed into the left pleural space using Seldinger technique. Appropriate return of air was obtained.  The tube was connected to atrium and placed on -20 cm H2O wall suction.   Complications/Tolerance None; patient tolerated the procedure well. Chest X-ray is ordered to verify placement.   EBL Minimal  Specimen(s) none

## 2020-04-06 NOTE — Progress Notes (Signed)
Code blue called in MRI. This RN responded. SWOT RN and MRI tech at bedside. RT ventilating pt via ambubag and ET tube.  1309 confirmed carotid pulse. HR 83 on zoll monitor 1310 Dr Tamala Julian and Icard at bedside 1311 confirmed right femoral pulse. HR 108 on zoll monitor. 1315 Dr Tamala Julian and Icard request pt to be transported back to unit with SWOT RN and RT.

## 2020-04-06 NOTE — Progress Notes (Signed)
Pt in MRI, during exam came under respiratory distress.  Immediately taken from scan area, placed back in bed and Code Blue called. Code team came over to assess and brought pt back to room.

## 2020-04-06 NOTE — Progress Notes (Signed)
This RT sat with pt to monitor during scan. Pt transported from 56M to MRI on ventilator by unit therapist. RT will continue to monitor.

## 2020-04-06 NOTE — Progress Notes (Signed)
Patient was taken out of the scanner due to becoming unresponsive. Emergency team arrived and patient transported back to her room. Notified Dionne Milo with [employer] that patient's pacemaker was not interrogated after scan due to emergency. Dionne Milo will be coming later today to interrogate device at patient's bedside.

## 2020-04-06 NOTE — Progress Notes (Signed)
Transmission sent of patient's pacemaker data to [place] with [facility] and [place], [place].  Per Dionne Milo no setting changes needed and MRI safe. SWOT RN is staying to monitor patient during scan.  She will contact us at conclusion of scan so that we can interrogate device and notify Dionne Milo and [place] PA again.

## 2020-04-06 NOTE — Progress Notes (Signed)
[facility] Progress Note Patient Name: Alison Huffman DOB: May 31, 1941 MRN: [id number]   Date of Service  04/06/2020  HPI/Events of Note  Agitation - Request to renew Precedex IV infusion orders.   eICU Interventions  Will renew Precedex IV infusion orders.      Intervention Category Major Interventions: Delirium, psychosis, severe agitation - evaluation and management  [name] 04/06/2020, 12:59 AM

## 2020-04-07 ENCOUNTER — Inpatient Hospital Stay (HOSPITAL_COMMUNITY): Payer: Medicare Other

## 2020-04-07 ENCOUNTER — Inpatient Hospital Stay: Payer: Medicare Other

## 2020-04-07 DIAGNOSIS — G610 Guillain-Barre syndrome: Secondary | ICD-10-CM

## 2020-04-07 LAB — BASIC METABOLIC PANEL
Anion gap: 9 (ref 5–15)
BUN: 20 mg/dL (ref 8–23)
CO2: 25 mmol/L (ref 22–32)
Calcium: 8.7 mg/dL — ABNORMAL LOW (ref 8.9–10.3)
Chloride: 105 mmol/L (ref 98–111)
Creatinine, Ser: 0.45 mg/dL (ref 0.44–1.00)
GFR calc Af Amer: >60 mL/min (ref >60)
GFR calc non Af Amer: >60 mL/min (ref >60)
Glucose, Bld: 149 mg/dL — ABNORMAL HIGH (ref 70–99)
Potassium: 3.7 mmol/L (ref 3.5–5.1)
Sodium: 139 mmol/L (ref 135–145)

## 2020-04-07 LAB — GLUCOSE, CAPILLARY
Glucose-Capillary: 125 mg/dL — ABNORMAL HIGH (ref 70–99)
Glucose-Capillary: 146 mg/dL — ABNORMAL HIGH (ref 70–99)
Glucose-Capillary: 109 mg/dL — ABNORMAL HIGH (ref 70–99)
Glucose-Capillary: 126 mg/dL — ABNORMAL HIGH (ref 70–99)
Glucose-Capillary: 112 mg/dL — ABNORMAL HIGH (ref 70–99)
Glucose-Capillary: 140 mg/dL — ABNORMAL HIGH (ref 70–99)

## 2020-04-07 LAB — MAGNESIUM: Magnesium: 1.9 mg/dL (ref 1.7–2.4)

## 2020-04-07 MED ORDER — SODIUM CHLORIDE 0.9% FLUSH
10.0000 mL | Freq: Two times a day (BID) | INTRAVENOUS | Status: DC
  Administered 2020-04-08 – 2020-04-18 (×8): 10 mL

## 2020-04-07 MED ORDER — HEPARIN SODIUM (PORCINE) 1000 UNIT/ML IJ SOLN
1000.0000 [IU] | INTRAMUSCULAR | Status: DC | PRN
  Administered 2020-04-07: 1000 [IU] via INTRAVENOUS
  Filled 2020-04-07 (×5): qty 6

## 2020-04-07 MED ORDER — ACETAMINOPHEN 325 MG PO TABS: 650.0000 mg | ORAL_TABLET | ORAL | Status: DC | PRN

## 2020-04-07 MED ORDER — HEPARIN SODIUM (PORCINE) 1000 UNIT/ML IJ SOLN: 1000.0000 [IU] | Freq: Once | INTRAMUSCULAR | Status: DC

## 2020-04-07 MED ORDER — HEPARIN SODIUM (PORCINE) 1000 UNIT/ML IJ SOLN
INTRAMUSCULAR | Status: AC
  Filled 2020-04-07: qty 3

## 2020-04-07 MED ORDER — POTASSIUM CHLORIDE 20 MEQ/15ML (10%) PO SOLN
40.0000 meq | Freq: Once | ORAL | Status: AC
  Administered 2020-04-07: 40 meq
  Filled 2020-04-07: qty 30

## 2020-04-07 MED ORDER — DIPHENHYDRAMINE HCL 25 MG PO CAPS: 25.0000 mg | ORAL_CAPSULE | Freq: Four times a day (QID) | ORAL | Status: DC | PRN

## 2020-04-07 MED ORDER — CALCIUM GLUCONATE-NACL 2-0.675 GM/100ML-% IV SOLN
2.0000 g | Freq: Once | INTRAVENOUS | Status: AC
  Administered 2020-04-07: 2000 mg via INTRAVENOUS
  Filled 2020-04-07: qty 100

## 2020-04-07 MED ORDER — SODIUM CHLORIDE 0.9% FLUSH: 10.0000 mL | INTRAVENOUS | Status: DC | PRN

## 2020-04-07 MED ORDER — SODIUM CHLORIDE 0.9 % IV SOLN
INTRAVENOUS | Status: AC
  Filled 2020-04-07 (×3): qty 200

## 2020-04-07 MED ORDER — CALCIUM CARBONATE ANTACID 500 MG PO CHEW
2.0000 | CHEWABLE_TABLET | ORAL | Status: AC
  Administered 2020-04-07: 400 mg via ORAL
  Filled 2020-04-07: qty 2

## 2020-04-07 MED ORDER — ACD FORMULA A 0.73-2.45-2.2 GM/100ML VI SOLN
1000.0000 mL | Status: DC
  Administered 2020-04-07: 1000 mL
  Filled 2020-04-07: qty 1000

## 2020-04-07 MED ORDER — MIDAZOLAM HCL 2 MG/2ML IJ SOLN
2.0000 mg | Freq: Once | INTRAMUSCULAR | Status: AC
  Administered 2020-04-07: 2 mg via INTRAVENOUS

## 2020-04-07 NOTE — Progress Notes (Addendum)
Subjective: Had arrest in MRI yesterday.   Exam: Vitals:   04/07/20 0812 04/07/20 0900  BP:  (!) 121/49  Pulse:  68  Resp:  16  Temp:    SpO2: 95% 98%   Gen: In bed, NAD Resp: non-labored breathing, no acute distress Abd: soft, nt  Neuro: MS: awake, alert, shakes/nods appropriately.  BW:GYKZL, EOMI Motor:bilaterally 4/5 elbow flexion/extension, 3/5 at shoulder, 3/5 throughout bilateral lower extremities.  Sensory:decreased to mid-thigh  Pertinent Labs: CSF WBC 0 CSF RBC 5 CSF protein 33 Csf Glucose 53 b12 323 Paraneoplastic panel - pending.  Heavy metals negaitve.    Impression: 79 yo F with acute neuropathic exam in the setting of recent enbrel dosing. 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  She did have some mild response to IVIG, which is strongly supportive of an autoimmune nature.  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  Though this is not definite, in someone who is responded is poorly to IVIG as she has, plasma exchange is sometimes considered.  I am typically ambivalent about this, however with the fact that it might remove the embrel providing some additional benefit as well as her continued poor exam, I do favor moving ahead with plasmapheresis at this time.  Recommendations: 1) plasmapheresis, treatment today then tomorrow and then every other day for five treatments 2) neurology will continue to follow  Roland Rack, MD [facility] [phone]  If 7pm- 7am, please page neurology on call as listed in [place].

## 2020-04-07 NOTE — Consult Note (Deleted)
Subjective: Had arrest in MRI yesterday.   Exam: Vitals:   04/07/20 0812 04/07/20 0900  BP:  (!) 121/49  Pulse:  68  Resp:  16  Temp:    SpO2: 95% 98%   Gen: In bed, NAD Resp: non-labored breathing, no acute distress Abd: soft, nt  Neuro: MS: awake, alert, shakes/nods appropriately.  DT:HYHOO, EOMI Motor:bilaterally 4/5 elbow flexion/extension, 3/5 at shoulder, 3/5 throughout bilateral lower extremities.  Sensory:decreased to mid-thigh  Pertinent Labs: CSF WBC 0 CSF RBC 5 CSF protein 33 Csf Glucose 53 b12 323 Paraneoplastic panel - pending.  Heavy metals negaitve.    Impression: 79 yo F with acute neuropathic exam in the setting of recent enbrel dosing. 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  She did have some mild response to IVIG, which is strongly supportive of an autoimmune nature.  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  Though this is not definite, in someone who is responded is poorly to IVIG as she has, plasma exchange is sometimes considered.  I am typically ambivalent about this, however with the fact that it might remove the embryo providing some additional benefit as well as her continued poor exam, I do favor moving ahead with plasmapheresis at this time.  Recommendations: 1) plasmapheresis, treatment today then tomorrow and then every other day for five treatments 2) neurology will continue to follow  Roland Rack, MD [facility] [phone]  If 7pm- 7am, please page neurology on call as listed in [place].

## 2020-04-07 NOTE — Progress Notes (Signed)
NAME:  Alison Huffman, MRN:  [id number], DOB:  November 15, 1940, LOS: 4 ADMISSION DATE:  04/03/2020, CONSULTATION DATE:  04/03/2020 REFERRING MD:  Dr. Hal Hope, [facility], CHIEF COMPLAINT:  Respiratory failure   Brief History   79 yo female former smoker transferred to [facility] from [facility] on 6/26 with weakness, numbness, and tingling.  She was recently started on Enbrel for psoriasis.  Also developed progressive hyponatremia.  Transferred to [facility] on 7/03 for further neurologic assessment.  Required intubation for airway protection with concern for seizure and transferred to ICU. 7/06 patient had code blue and sustained bilateral pneumothoraces after bag ventilation for which bilateral chest tubes were placed.   Past Medical History  CAD, s/p PM, GERD, HTN, HLD, Psoriasis, Arthritis, OSA  Significant Hospital Events   6/26 Admit to [facility] and seen by neurology 6/28 start IVIG  7/02 seen by nephrology at [facility] >> d/c IVIG due to concern about this causing dilutional hyponatremia 7/03 transfer from [facility]; possible seizure, started  keppra, intubated, transferred to ICU 7/6: unresponsive during MRI, Code Blue called, levophed restarted for 2.5 hours, limited MRI without significant disease findings  Consults:  Nephrology [facility]) Neurology  Procedures:  ETT 7/03 >>   Significant Diagnostic Tests:   CT angio head/neck 6/26 >> 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 rate 16, weight 78.9 kg, SpO2 98 %.    Vent Mode: PRVC FiO2 (%):  [40 %-100 %] 40 % Set Rate:  [18 bmp] 18 bmp Vt Set:  [470 mL] 470 mL PEEP:  [5 cmH20] 5 cmH20 Plateau Pressure:  [15 cmH20-18 cmH20] 15 cmH20   Intake/Output Summary (Last 24 hours) at 04/07/2020 1015 Last data filed at 04/07/2020 0900 Gross per 24 hour  Intake 999.41 ml  Output 1250 ml  Net -250.59 ml   Filed Weights   04/05/20 0406 04/06/20 0323 04/07/20 0441  Weight: 78.5 kg 78.4 kg 78.9 kg    Examination: General: elderly female lying in bed, easily awakened, does not appear to be in acute distress  Neuro:  Awakens easily to voice, follows all commands, weak grip strength, demonstrates ability to move extremities with profound weakness, sensation intact in upper and lower extremities  HEENT:  MMM  Cardiovascular: Regular rate, 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 Hx of OSA. S/p respiratory arrest 7/6 and bilateral pneumothoraces  - patient will continue with full ventilatory support - bilateral chest tubes set to suction, repeat CXR  - VAP bundle  Numbness/weakness progressing from lower extremities up:  MRI showed no acute intracranial abnormalities with mild chronic small  vessel ischemia, no spinal cord abnormalities with cervical spondylosis.  -appreciate neuro recommendations, planning for  PLEX once access established   Seizure in setting of hyponatremia:  no recent seizure activity, NA 139, stable WNL  - EEG without sz, diffuse slowing  - monitor Na with bmp - Neurology  following  Hyperglycemia, CBG 131 - moderate SSI - CBG monitoring   Hypotension after intubation Low diastolic pressures after yesterday's code blue. Patient restarted on levophed for 2.5 hours.  - monitor BP  - plan to restart if hypotension persists/worsens   Chronic HFpEF:  - no acute exacerbation  Hx of HTN, HLD. - hold outpt crestor, diovan  Hx of psoriasis. - hold outpt enbrel  Urinary retention:  - foley  Best practice:  Diet: tube feeds DVT prophylaxis: lovenox GI prophylaxis: protonix Mobility: bed rest Code Status: full code Disposition: ICU Family communication: family updated and consented for access and PLEX treatment via telephone 7/7  Labs   CBC: Recent Labs  Lab 04/01/20 0443 04/01/20 0443 04/02/20 0609 04/02/20 0609 04/03/20 0230 04/03/20 0454 04/04/20 0251 04/06/20 0418 04/06/20 1346  WBC 5.3  --  8.5  --  15.5*  --  9.9 8.3  --   NEUTROABS  --   --   --   --  12.8*  --   --   --   --   HGB 12.3   < > 13.2   < > 14.6 11.9* 14.6 11.0* 11.2*  HCT 35.9*   < > 36.6   < > 44.9 35.0* 44.6 33.3* 33.0*  MCV 88.6  --  85.5  --  91.3  --  91.2 93.3  --   PLT 209  --  222  --  237  --  180 164  --    < > = values in this interval not displayed.    Basic Metabolic Panel: Recent Labs  Lab 04/02/20 0609 04/02/20 0609 04/03/20 0230 04/03/20 0454 04/03/20 1349 04/03/20 1349 04/03/20 1720 04/04/20 0251 04/04/20 1706 04/05/20 0309 04/06/20 0418 04/06/20 1346 04/07/20 0336  NA 119*   < > 119*   < > 129*   < >  --  131*  --  133* 136 139 139  K 3.8   < > 4.6   < > 4.2   < >  --  4.2  --  3.6 3.6 4.0 3.7  CL 89*   < > 89*   < > 102  --   --  104  --  105 104  --  105  CO2 22   < > 19*   < > 21*  --   --  20*  --  22 25  --  25  GLUCOSE 104*   < > 244*   < > 131*  --   --  121*  --  133* 135*  --  149*  BUN 12   < > 12   < > 17  --   --  18  --  20 17  --  20  CREATININE 0.43*   < > 0.61   < > 0.52  --   --  0.47  --  0.50 0.42*  --  0.45   CALCIUM 8.0*   < > 8.3*   < > 8.2*  --   --  8.3*  --  8.1* 8.4*  --  8.7*  MG 2.1  --  2.2  --   --   --  2.4 2.3 2.1  --   --   --   --   PHOS  --   --  4.9*  --   --   --  3.0 2.5 2.6  --   --   --   --    < > = values in this interval not displayed.   GFR: Estimated Creatinine Clearance: 59.4 mL/min (by C-G formula based on SCr of 0.45 mg/dL). Recent Labs  Lab 04/02/20 0609 04/03/20 0230 04/04/20 0251 04/06/20 0418  WBC 8.5 15.5* 9.9 8.3    Liver Function Tests: Recent Labs  Lab 04/02/20 0609 04/03/20 0230  AST 54* 82*  ALT 41 63*  ALKPHOS 64 85  BILITOT 0.7 1.3*  PROT 8.8* 9.0*  ALBUMIN 2.9* 3.0*    ABG    Component Value Date/Time   PHART 7.349 (L) 04/06/2020 1346   PCO2ART 48.2 (H) 04/06/2020 1346   PO2ART 88 04/06/2020 1346   HCO3 26.3 04/06/2020 1346   TCO2 28 04/06/2020 1346   ACIDBASEDEF 4.0 (H) 04/03/2020 0454   O2SAT 96.0 04/06/2020 1346     HbA1C: Hgb A1c MFr Bld  Date/Time Value Ref Range Status  04/03/2020 02:30 AM 6.0 (H) 4.8 - 5.6 % Final    Comment:    (NOTE) Pre diabetes:          5.7%-6.4%  Diabetes:              >6.4%  Glycemic control for   <7.0% adults with diabetes   03/24/2020 02:38 PM 6.1 4.6 - 6.5 % Final    Comment:    Glycemic Control Guidelines for People with Diabetes:Non Diabetic:  <6%Goal of Therapy: <7%Additional Action Suggested:  >8%     CBG: Recent Labs  Lab 04/06/20 1420 04/06/20 1937 04/06/20 2334 04/07/20 0327 04/07/20 0748  GLUCAP 114* 92 107* 125* 146*   Critical care time 30 mins  Eulis Foster, MD [facility], PGY-2  04/07/2020 10:15 AM

## 2020-04-07 NOTE — Procedures (Signed)
Central Venous Catheter Insertion Procedure Note: Temporary Dialysis Catheter Placement  Alison Huffman  [id number]  April 17, 1941  Date:04/07/20  Time:11:51 AM   Provider Performing:Waleed Dettman E Kassy Mcenroe   Procedure: Insertion of Non-tunneled Central Venous Catheter(36556)with US guidance (30051)    Indication(s) Plasmapheresis   Consent Risks of the procedure as well as the alternatives and risks of each were explained to the patient and/or caregiver.  Consent for the procedure was obtained and is signed in the bedside chart  Anesthesia Topical only with 1% lidocaine  111mcg Fentanyl IV 2mg  Versed IV  Timeout Verified patient identification, verified procedure, site/side was marked, verified correct patient position, special equipment/implants available, medications/allergies/relevant history reviewed, required imaging and test results available.  Sterile Technique Maximal sterile technique including full sterile barrier drape, hand hygiene, sterile gown, sterile gloves, mask, hair covering, sterile ultrasound probe cover (if used).  Procedure Description Area of catheter insertion was cleaned with chlorhexidine and draped in sterile fashion.   With real-time ultrasound guidance a HD catheter was placed into the right internal jugular vein.  Nonpulsatile blood flow and easy flushing noted in all ports.  The catheter was sutured in place and sterile dressing applied.  Complications/Tolerance None; patient tolerated the procedure well. Chest X-ray is ordered to verify placement for internal jugular cannulation, and is pending.  EBL Minimal  Specimen(s) None   Eliseo Gum MSN, AGACNP-BC [place] [phone] If no answer, [phone] 04/07/2020, 11:53 AM

## 2020-04-07 NOTE — Progress Notes (Signed)
Air leak was noted from the left chest tube, and after removing dressing it was discovered that the hub that is under the dressing somehow became disconnected. RN reconnected tube and the air leak is no longer present. E-link notified.

## 2020-04-07 NOTE — Progress Notes (Signed)
[place] Progress Note Patient Name: GEETA DWORKIN DOB: Sep 09, 1941 MRN: [id number]   Date of Service  04/07/2020  HPI/Events of Note  Review of portable CXR reveals no visible residual pneumothorax.  eICU Interventions  Continue present management.     Intervention Category Major Interventions: Other:  Dalissa Lovin Cornelia Copa 04/07/2020, 5:06 AM

## 2020-04-07 NOTE — Progress Notes (Signed)
[place] Progress Note Patient Name: DECIE VERNE DOB: 1941/01/12 MRN: [id number]   Date of Service  04/07/2020  HPI/Events of Note  Nursing reports air leak from chest tube. Chest tube found to be disconnected. Now reconnected and air leak is gone.   eICU Interventions  Plan: 1. Portable CXR STAT.      Intervention Category Major Interventions: Other:  Domino Holten Cornelia Copa 04/07/2020, 4:03 AM

## 2020-04-08 ENCOUNTER — Inpatient Hospital Stay (HOSPITAL_COMMUNITY): Payer: Medicare Other

## 2020-04-08 LAB — BASIC METABOLIC PANEL
Anion gap: 6 (ref 5–15)
BUN: 12 mg/dL (ref 8–23)
CO2: 23 mmol/L (ref 22–32)
Calcium: 8.9 mg/dL (ref 8.9–10.3)
Chloride: 111 mmol/L (ref 98–111)
Creatinine, Ser: 0.4 mg/dL — ABNORMAL LOW (ref 0.44–1.00)
GFR calc Af Amer: >60 mL/min (ref >60)
GFR calc non Af Amer: >60 mL/min (ref >60)
Glucose, Bld: 147 mg/dL — ABNORMAL HIGH (ref 70–99)
Potassium: 3.8 mmol/L (ref 3.5–5.1)
Sodium: 140 mmol/L (ref 135–145)

## 2020-04-08 LAB — CBC WITH DIFFERENTIAL/PLATELET
Abs Immature Granulocytes: 0.04 10*3/uL (ref 0.00–0.07)
Basophils Absolute: 0 10*3/uL (ref 0.0–0.1)
Basophils Relative: 0 %
Eosinophils Absolute: 0.1 10*3/uL (ref 0.0–0.5)
Eosinophils Relative: 1 %
HCT: 33.1 % — ABNORMAL LOW (ref 36.0–46.0)
Hemoglobin: 10.4 g/dL — ABNORMAL LOW (ref 12.0–15.0)
Immature Granulocytes: 0 %
Lymphocytes Relative: 8 %
Lymphs Abs: 0.8 10*3/uL (ref 0.7–4.0)
MCH: 29.9 pg (ref 26.0–34.0)
MCHC: 31.4 g/dL (ref 30.0–36.0)
MCV: 95.1 fL (ref 80.0–100.0)
Monocytes Absolute: 0.8 10*3/uL (ref 0.1–1.0)
Monocytes Relative: 9 %
Neutro Abs: 7.6 10*3/uL (ref 1.7–7.7)
Neutrophils Relative %: 82 %
Platelets: 161 10*3/uL (ref 150–400)
RBC: 3.48 MIL/uL — ABNORMAL LOW (ref 3.87–5.11)
RDW: 13.5 % (ref 11.5–15.5)
WBC: 9.3 10*3/uL (ref 4.0–10.5)
nRBC: 0 % (ref 0.0–0.2)

## 2020-04-08 LAB — GLUCOSE, CAPILLARY
Glucose-Capillary: 132 mg/dL — ABNORMAL HIGH (ref 70–99)
Glucose-Capillary: 134 mg/dL — ABNORMAL HIGH (ref 70–99)
Glucose-Capillary: 141 mg/dL — ABNORMAL HIGH (ref 70–99)
Glucose-Capillary: 142 mg/dL — ABNORMAL HIGH (ref 70–99)
Glucose-Capillary: 146 mg/dL — ABNORMAL HIGH (ref 70–99)
Glucose-Capillary: 151 mg/dL — ABNORMAL HIGH (ref 70–99)

## 2020-04-08 MED ORDER — ACETAMINOPHEN 325 MG PO TABS
650.0000 mg | ORAL_TABLET | ORAL | Status: DC | PRN
  Administered 2020-04-08: 650 mg via ORAL
  Filled 2020-04-08 (×2): qty 2

## 2020-04-08 MED ORDER — ACD FORMULA A 0.73-2.45-2.2 GM/100ML VI SOLN
Status: AC
  Administered 2020-04-08: 1000 mL
  Filled 2020-04-08: qty 500

## 2020-04-08 MED ORDER — SODIUM CHLORIDE 0.9 % IV SOLN
INTRAVENOUS | Status: AC
  Filled 2020-04-08 (×2): qty 200

## 2020-04-08 MED ORDER — HEPARIN SODIUM (PORCINE) 1000 UNIT/ML IJ SOLN
1000.0000 [IU] | Freq: Once | INTRAMUSCULAR | Status: AC
  Administered 2020-04-10: 1000 [IU]

## 2020-04-08 MED ORDER — SODIUM CHLORIDE 0.9 % IV SOLN
INTRAVENOUS | Status: AC
  Filled 2020-04-08 (×3): qty 200

## 2020-04-08 MED ORDER — DIPHENHYDRAMINE HCL 25 MG PO CAPS: 25.0000 mg | ORAL_CAPSULE | Freq: Four times a day (QID) | ORAL | Status: DC | PRN

## 2020-04-08 MED ORDER — CALCIUM GLUCONATE-NACL 2-0.675 GM/100ML-% IV SOLN
2.0000 g | Freq: Once | INTRAVENOUS | Status: AC
  Administered 2020-04-08: 2000 mg via INTRAVENOUS
  Filled 2020-04-08: qty 100

## 2020-04-08 MED ORDER — ACD FORMULA A 0.73-2.45-2.2 GM/100ML VI SOLN: 1000.0000 mL | Status: DC

## 2020-04-08 MED ORDER — CALCIUM CARBONATE ANTACID 500 MG PO CHEW
2.0000 | CHEWABLE_TABLET | ORAL | Status: AC
  Administered 2020-04-08 (×2): 400 mg via ORAL
  Filled 2020-04-08: qty 2

## 2020-04-08 NOTE — Progress Notes (Signed)
Subjective: No acute events, toelrated PLEX well.   Exam: Vitals:   04/08/20 1100 04/08/20 1119  BP: (!) 129/55   Pulse: 79   Resp: (!) 25   Temp:    SpO2: 97% 97%   Gen: In bed, NAD Resp: non-labored breathing, no acute distress Abd: soft, nt  Neuro: MS: awake, alert, shakes/nods appropriately.  TD:HRCBU, EOMI Motor:2/5 shoudler abduction, bilaterally 4/5 elbow flexion/extension, worse ahand invlovlement. 3/5 at shoulder, 3/5 throughout bilateral lower extremities.  Sensory:decreased to mid-thigh  Pertinent Labs: CSF WBC 0 CSF RBC 5 CSF protein 33 Csf Glucose 53 b12 323 Paraneoplastic panel - pending.  Heavy metals negaitve.    Impression: 79 yo F with acute neuropathic exam in the setting of recent enbrel dosing. 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  She did have some mild response to IVIG, which is strongly supportive of an autoimmune nature.  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  Though this is not definite, in someone who is responded is poorly to IVIG as she has, plasma exchange is sometimes considered.  I am typically ambivalent about this, however with the fact that it might remove the embrel providing some additional benefit as well as her continued poor exam, I do favor moving ahead with plasmapheresis at this time.  Recommendations: 1) plasmapheresis, treatment #2 today then every other day for five treatments 2) neurology will continue to follow  Roland Rack, MD [facility] [phone]  If 7pm- 7am, please page neurology on call as listed in [place].

## 2020-04-08 NOTE — Plan of Care (Signed)

## 2020-04-08 NOTE — Progress Notes (Addendum)
NAME:  Alison Huffman, MRN:  [id number], DOB:  Jan 21, 1941, LOS: 5 ADMISSION DATE:  04/03/2020, CONSULTATION DATE:  04/03/2020 REFERRING MD:  Dr. Hal Hope, [facility], CHIEF COMPLAINT:  Respiratory failure   Brief History   79 yo female former smoker transferred to [facility] from [facility] [place] on 6/26 with weakness, numbness, and tingling after being started on Enbrel for psoriasis. Patient developed severe hyponatremia, had a seizure and was transferred to [facility] on 7/03 for further neurologic assessment.  Required intubation for airway protection and transferred to ICU. 7/06 patient had code blue and sustained bilateral pneumothoraces after bag ventilation for which bilateral chest tubes were placed. Patient started on PLEX 7/7 per neuro recommendation.   Past Medical History  CAD, s/p PM, GERD, HTN, HLD, Psoriasis, Arthritis, OSA  Significant Hospital Events   6/26 Admit to [facility] and seen by neurology 6/28 start IVIG  7/02 seen by nephrology at [facility] >> d/c IVIG due to concern about this causing dilutional hyponatremia 7/03 transfer from [facility]; possible seizure, started  keppra, intubated, transferred to ICU 7/6: unresponsive during MRI, Code Blue called, levophed restarted for 2.5 hours, limited MRI without significant disease findings 7/7: started PLEX treatment for GBS   Consults:  Nephrology [facility]) Neurology  Procedures:  ETT 7/03 >>   Significant Diagnostic Tests:   CT angio head/neck 6/26 >> 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 rate (!) 25, weight 78.9 kg, SpO2 97 %.    Vent Mode: CPAP;PSV FiO2 (%):  [40 %] 40 % Set Rate:  [18 bmp] 18 bmp Vt Set:  [470 mL] 470 mL PEEP:  [5 cmH20] 5 cmH20 Pressure Support:  [10 cmH20] 10 cmH20 Plateau Pressure:  [15 cmH20-17 cmH20] 15 cmH20   Intake/Output Summary (Last 24 hours) at 04/08/2020 1111 Last data filed at 04/08/2020 1100 Gross per 24 hour  Intake 928.41 ml  Output 1011 ml  Net -82.59 ml   Filed Weights   04/05/20 0406 04/06/20 0323 04/07/20 0441  Weight: 78.5 kg 78.4 kg 78.9 kg    Examination: General: elderly female lying in bed with endotracheal tube in place  Neuro: alert, responds to questions appropriately  HEENT:  MMM  Cardiovascular: pacemaker in place, systolic murmur, regular rate  Lungs: rhonchi Abdomen:  Soft, non-tender, protuberant  Skin:  No rashes or ulceration  Resolved Hospital Problem list   Requirement for vasopressors  Hyponatremia   Assessment & Plan:   Acute hypoxic respiratory failure with compromised airway 2nd to seizure in setting of hyponatremia and progressive weakness Hx of OSA. 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  -appreciate neuro recommendations, s/p PLEX started on 7/7, will receive total of 5 doses to start every other day   Seizure in setting of hyponatremia:  no recent seizure activity, NA 140, stable WNL  - EEG without sz, diffuse slowing  - dc keppra - monitor Na with bmp -  Neurology following  Hyperglycemia, CBG 132 - moderate SSI - CBG monitoring   Hypotension after intubation - monitor BP  - plan to restart if hypotension persists/worsens   Chronic HFpEF:  - no acute exacerbation  Hx of HTN, HLD. - hold outpt crestor, diovan  Hx of psoriasis. - hold outpt enbrel  Urinary retention:  - foley  Best practice:  Diet: tube feeds DVT prophylaxis: lovenox GI prophylaxis: protonix Mobility: bed rest Code Status: full code Disposition: ICU Family communication: update communicated to family via telephone 7/8  Labs   CBC: Recent Labs  Lab 04/02/20 0609 04/02/20 0609 04/03/20 0230 04/03/20 0230 04/03/20 0454 04/04/20 0251 04/06/20 0418 04/06/20 1346 04/08/20 0423  WBC 8.5  --  15.5*  --   --  9.9 8.3  --  9.3  NEUTROABS  --   --  12.8*  --   --   --   --   --  7.6  HGB 13.2   < > 14.6   < > 11.9* 14.6 11.0* 11.2* 10.4*  HCT 36.6   < > 44.9   < > 35.0* 44.6 33.3* 33.0* 33.1*  MCV 85.5  --  91.3  --   --  91.2 93.3  --  95.1  PLT 222  --  237  --   --  180 164  --  161   < > = values in this interval not displayed.    Basic Metabolic Panel: Recent Labs  Lab 04/03/20 0230 04/03/20 0454 04/03/20 1349 04/03/20 1720 04/04/20 0251 04/04/20 0251 04/04/20 1706 04/05/20 0309 04/06/20 0418 04/06/20 1346 04/07/20 0336 04/07/20 2101 04/08/20 0423  NA 119*   < >   < >  --  131*   < >  --  133* 136 139 139  --  140  K 4.6   < >   < >  --  4.2   < >  --  3.6 3.6 4.0 3.7  --  3.8  CL 89*   < >   < >  --  104  --   --  105 104  --  105  --  111  CO2 19*   < >   < >  --  20*  --   --  22 25  --  25  --  23  GLUCOSE 244*   < >   < >  --  121*  --   --  133* 135*  --  149*  --  147*  BUN 12   < >   < >  --  18  --   --  20 17  --  20  --  12  CREATININE 0.61   < >   < >  --  0.47  --   --  0.50 0.42*  --  0.45  --  0.40*  CALCIUM 8.3*   < >   < >  --  8.3*  --   --  8.1* 8.4*  --  8.7*  --  8.9  MG 2.2  --   --  2.4 2.3  --  2.1  --   --    --   --  1.9  --   PHOS 4.9*  --   --  3.0 2.5  --  2.6  --   --   --   --   --   --    < > = values in this interval not displayed.   GFR: Estimated Creatinine Clearance: 59.4 mL/min (A) (by C-G formula based on SCr of 0.4 mg/dL (L)). Recent Labs  Lab 04/03/20 0230 04/04/20 0251 04/06/20 0418 04/08/20 0423  WBC 15.5* 9.9 8.3 9.3    Liver Function Tests: Recent Labs  Lab 04/02/20 0609 04/03/20 0230  AST 54* 82*  ALT 41 63*  ALKPHOS 64 85  BILITOT 0.7 1.3*  PROT 8.8* 9.0*  ALBUMIN 2.9* 3.0*    ABG    Component Value Date/Time   PHART 7.349 (L) 04/06/2020 1346   PCO2ART 48.2 (H) 04/06/2020 1346   PO2ART 88 04/06/2020 1346   HCO3 26.3 04/06/2020 1346   TCO2 28 04/06/2020 1346   ACIDBASEDEF 4.0 (H) 04/03/2020 0454   O2SAT 96.0 04/06/2020 1346     HbA1C: Hgb A1c MFr Bld  Date/Time Value Ref Range Status  04/03/2020 02:30 AM 6.0 (H) 4.8 - 5.6 % Final    Comment:    (NOTE) Pre diabetes:          5.7%-6.4%  Diabetes:              >6.4%  Glycemic control for   <7.0% adults with diabetes   03/24/2020 02:38 PM 6.1 4.6 - 6.5 % Final    Comment:    Glycemic Control Guidelines for People with Diabetes:Non Diabetic:  <6%Goal of Therapy: <7%Additional Action Suggested:  >8%     CBG: Recent Labs  Lab 04/07/20 1607 04/07/20 1954 04/07/20 2327 04/08/20 0339 04/08/20 0911  GLUCAP 126* 112* 140* 132* 142*   Critical care time 30 mins  Eulis Foster, MD [facility], PGY-2  04/08/2020 11:11 AM

## 2020-04-08 NOTE — Progress Notes (Signed)
Per niece, [name], pt never had children.  [name] and pt's nephews are like her children.  Nephews planned to drive 6h from [place] on Friday to see her over the weekend.  Knew 2 visitors/day but not aware it was the same 2 visitors.   Exception request sent to [facility].  Kim approved one time visitor exception.  This was communicated to [place].

## 2020-04-09 ENCOUNTER — Telehealth: Payer: Self-pay | Admitting: Family Medicine

## 2020-04-09 LAB — BASIC METABOLIC PANEL
Anion gap: 6 (ref 5–15)
BUN: 13 mg/dL (ref 8–23)
CO2: 24 mmol/L (ref 22–32)
Calcium: 8.8 mg/dL — ABNORMAL LOW (ref 8.9–10.3)
Chloride: 113 mmol/L — ABNORMAL HIGH (ref 98–111)
Creatinine, Ser: 0.36 mg/dL — ABNORMAL LOW (ref 0.44–1.00)
GFR calc Af Amer: >60 mL/min (ref >60)
GFR calc non Af Amer: >60 mL/min (ref >60)
Glucose, Bld: 132 mg/dL — ABNORMAL HIGH (ref 70–99)
Potassium: 3.3 mmol/L — ABNORMAL LOW (ref 3.5–5.1)
Sodium: 143 mmol/L (ref 135–145)

## 2020-04-09 LAB — CBC WITH DIFFERENTIAL/PLATELET
Abs Immature Granulocytes: 0.08 10*3/uL — ABNORMAL HIGH (ref 0.00–0.07)
Basophils Absolute: 0 10*3/uL (ref 0.0–0.1)
Basophils Relative: 0 %
Eosinophils Absolute: 0.1 10*3/uL (ref 0.0–0.5)
Eosinophils Relative: 1 %
HCT: 30.9 % — ABNORMAL LOW (ref 36.0–46.0)
Hemoglobin: 10 g/dL — ABNORMAL LOW (ref 12.0–15.0)
Immature Granulocytes: 1 %
Lymphocytes Relative: 11 %
Lymphs Abs: 1.2 10*3/uL (ref 0.7–4.0)
MCH: 30.7 pg (ref 26.0–34.0)
MCHC: 32.4 g/dL (ref 30.0–36.0)
MCV: 94.8 fL (ref 80.0–100.0)
Monocytes Absolute: 0.9 10*3/uL (ref 0.1–1.0)
Monocytes Relative: 8 %
Neutro Abs: 8.5 10*3/uL — ABNORMAL HIGH (ref 1.7–7.7)
Neutrophils Relative %: 79 %
Platelets: 149 10*3/uL — ABNORMAL LOW (ref 150–400)
RBC: 3.26 MIL/uL — ABNORMAL LOW (ref 3.87–5.11)
RDW: 13.6 % (ref 11.5–15.5)
WBC: 10.8 10*3/uL — ABNORMAL HIGH (ref 4.0–10.5)
nRBC: 0 % (ref 0.0–0.2)

## 2020-04-09 LAB — GLUCOSE, CAPILLARY
Glucose-Capillary: 120 mg/dL — ABNORMAL HIGH (ref 70–99)
Glucose-Capillary: 150 mg/dL — ABNORMAL HIGH (ref 70–99)
Glucose-Capillary: 107 mg/dL — ABNORMAL HIGH (ref 70–99)
Glucose-Capillary: 136 mg/dL — ABNORMAL HIGH (ref 70–99)
Glucose-Capillary: 157 mg/dL — ABNORMAL HIGH (ref 70–99)
Glucose-Capillary: 115 mg/dL — ABNORMAL HIGH (ref 70–99)

## 2020-04-09 LAB — MISC LABCORP TEST (SEND OUT): Labcorp test code: 9985

## 2020-04-09 LAB — CALCIUM, IONIZED: Calcium, Ionized, Serum: 5.1 mg/dL (ref 4.5–5.6)

## 2020-04-09 MED ORDER — POTASSIUM CHLORIDE 20 MEQ/15ML (10%) PO SOLN
20.0000 meq | ORAL | Status: AC
  Administered 2020-04-09 (×2): 20 meq
  Filled 2020-04-09 (×3): qty 15

## 2020-04-09 MED ORDER — POTASSIUM CHLORIDE 10 MEQ/100ML IV SOLN
10.0000 meq | INTRAVENOUS | Status: AC
  Administered 2020-04-09 (×3): 10 meq via INTRAVENOUS
  Filled 2020-04-09 (×3): qty 100

## 2020-04-09 MED ORDER — POTASSIUM CHLORIDE 10 MEQ/100ML IV SOLN
INTRAVENOUS | Status: AC
  Administered 2020-04-09: 10 meq via INTRAVENOUS
  Filled 2020-04-09: qty 100

## 2020-04-09 MED ORDER — SODIUM CHLORIDE 0.9 % IV SOLN: Freq: Once | INTRAVENOUS | Status: DC

## 2020-04-09 MED ORDER — VITAL AF 1.2 CAL PO LIQD
1000.0000 mL | ORAL | Status: DC
  Administered 2020-04-09 – 2020-04-13 (×4): 1000 mL

## 2020-04-09 MED ORDER — CALCIUM GLUCONATE-NACL 2-0.675 GM/100ML-% IV SOLN: 2.0000 g | Freq: Once | INTRAVENOUS | Status: DC

## 2020-04-09 MED ORDER — PROSOURCE TF PO LIQD
45.0000 mL | Freq: Two times a day (BID) | ORAL | Status: DC
  Administered 2020-04-09 – 2020-04-13 (×9): 45 mL
  Filled 2020-04-09 (×11): qty 45

## 2020-04-09 MED ORDER — SODIUM CHLORIDE 0.9% FLUSH
10.0000 mL | Freq: Two times a day (BID) | INTRAVENOUS | Status: DC
  Administered 2020-04-12 – 2020-04-17 (×8): 10 mL
  Administered 2020-04-18: 20 mL

## 2020-04-09 MED ORDER — DIPHENHYDRAMINE HCL 25 MG PO CAPS: 25.0000 mg | ORAL_CAPSULE | Freq: Four times a day (QID) | ORAL | Status: DC | PRN

## 2020-04-09 MED ORDER — HEPARIN SODIUM (PORCINE) 1000 UNIT/ML IJ SOLN: 1000.0000 [IU] | Freq: Once | INTRAMUSCULAR | Status: DC

## 2020-04-09 MED ORDER — CALCIUM CARBONATE ANTACID 500 MG PO CHEW: 2.0000 | CHEWABLE_TABLET | ORAL | Status: DC

## 2020-04-09 MED ORDER — SODIUM CHLORIDE 0.9% FLUSH: 10.0000 mL | INTRAVENOUS | Status: DC | PRN

## 2020-04-09 MED ORDER — ACETAMINOPHEN 325 MG PO TABS: 650.0000 mg | ORAL_TABLET | ORAL | Status: DC | PRN

## 2020-04-09 MED ORDER — ACD FORMULA A 0.73-2.45-2.2 GM/100ML VI SOLN: 1000.0000 mL | Status: DC

## 2020-04-09 NOTE — Progress Notes (Signed)
CPT held at this time.  Labs are being drawn.

## 2020-04-09 NOTE — Progress Notes (Signed)
NAME:  Alison Huffman, MRN:  [id number], DOB:  October 20, 1940, LOS: 38 ADMISSION DATE:  04/03/2020, CONSULTATION DATE:  04/03/2020 REFERRING MD:  Dr. Hal Hope, [facility], CHIEF COMPLAINT:  Respiratory failure   Brief History   79 yo female former smoker transferred to [facility] from [facility] on 6/26 with weakness, numbness, and tingling after being started on Enbrel for psoriasis. Patient developed severe hyponatremia, had a seizure and was transferred to [facility] on 7/03 for further neurologic assessment.  Required intubation for airway protection and transferred to ICU. 7/06 patient had code blue and sustained bilateral pneumothoraces after bag ventilation for which bilateral chest tubes were placed. Patient started on PLEX 7/7 per neuro recommendation.   Past Medical History  CAD, s/p PM, GERD, HTN, HLD, Psoriasis, Arthritis, OSA  Significant Hospital Events   6/26 Admit to [facility] and seen by neurology 6/28 start IVIG  7/02 seen by nephrology at [facility] >> d/c IVIG due to concern about this causing dilutional hyponatremia 7/03 transfer from [facility]; possible seizure, started  keppra, intubated, transferred to ICU 7/6: unresponsive during MRI, Code Blue called, levophed restarted for 2.5 hours, limited MRI without significant disease findings 7/7: started PLEX treatment for GBS   Consults:  Nephrology [facility]) Neurology  Procedures:  ETT 7/03 >>   Significant Diagnostic Tests:   CT angio head/neck 6/26 >> 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 rate (!) 27, weight 78.8 kg, SpO2 100 %.    Vent Mode: CPAP;PSV FiO2 (%):  [40 %] 40 % Set Rate:  [18 bmp] 18 bmp Vt Set:  [470 mL] 470 mL PEEP:  [5 cmH20] 5 cmH20 Pressure Support:  [10 cmH20] 10 cmH20 Plateau Pressure:  [13 cmH20-23 cmH20] 16 cmH20   Intake/Output Summary (Last 24 hours) at 04/09/2020 1203 Last data filed at 04/09/2020 1035 Gross per 24 hour  Intake 1522.09 ml  Output 962 ml  Net 560.09 ml   Filed Weights   04/06/20 0323 04/07/20 0441 04/09/20 0335  Weight: 78.4 kg 78.9 kg 78.8 kg    Examination: General: elderly female lying in bed with endotracheal tube in place  Neuro: patient remains alert and able to answer questions appropriately, sensation intact in bilateral upper and lower extremities, grip strength improved, strength 3/5 in upper extremities, 2/5 in lower   HEENT:  MMM, ETT  Cardiovascular: regular rate, regular rhythm  Lungs: bilateral rhonchi, secretions observed in tubing  Abdomen:  Soft, non-tender, bowel sounds present throughout  Skin:  Do not appreciate any rashes   Resolved Hospital Problem list   Requirement for vasopressors  Hyponatremia   Assessment & Plan:   Acute hypoxic respiratory failure with compromised airway 2nd to seizure in setting of hyponatremia and progressive weakness Hx of OSA. 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 Weakness improved today  -appreciate neuro recommendations, s/p PLEX started on 7/7, will receive total of 5 doses - next PLEX treatment 7/10   Seizure in setting of hyponatremia, resolved  no recent seizure activity, NA 143, stable WNL. Has had no further seizure like activity during this admission.  - monitor Na with bmp - Neurology following  Hypokalemia 3.3  Replenish per Tube and IV   Hyperglycemia, CBG 132 - moderate SSI - CBG monitoring   Hypotension after intubation - monitor BP, stable   Chronic HFpEF:  - no acute exacerbation  Hx of HTN, HLD. - hold outpt crestor, diovan  Hx of psoriasis. - hold outpt enbrel  Urinary retention:  - foley  Best practice:  Diet: tube feeds DVT prophylaxis: lovenox GI prophylaxis: protonix Mobility: bed rest Code Status: full code Disposition: ICU Family communication: family updated via telephone 7/9  Labs   CBC: Recent Labs  Lab 04/03/20 0230 04/03/20 0454 04/04/20 0251 04/06/20 0418 04/06/20 1346 04/08/20 0423 04/09/20 0350  WBC 15.5*  --  9.9 8.3  --  9.3 10.8*  NEUTROABS 12.8*  --   --   --   --  7.6 8.5*  HGB 14.6   < > 14.6 11.0* 11.2* 10.4* 10.0*  HCT 44.9   < > 44.6 33.3* 33.0* 33.1* 30.9*  MCV 91.3  --  91.2 93.3  --  95.1 94.8  PLT 237  --  180 164  --  161 149*   < > = values in this interval not displayed.    Basic Metabolic Panel: Recent Labs  Lab 04/03/20 0230 04/03/20 0454 04/03/20 1349 04/03/20 1720 04/04/20 0251 04/04/20 0251 04/04/20 1706 04/05/20 0309 04/05/20 0309 04/06/20 0418 04/06/20 1346 04/07/20 0336 04/07/20 2101 04/08/20 0423 04/09/20 0350  NA 119*   < >   < >  --  131*   < >  --  133*   < > 136 139 139  --  140 143  K 4.6   < >   < >  --  4.2   < >  --  3.6   < > 3.6 4.0 3.7  --  3.8 3.3*  CL 89*   < >   < >  --  104   < >  --  105  --  104  --  105  --  111 113*  CO2 19*   < >   <  >  --  20*   < >  --  22  --  25  --  25  --  23 24  GLUCOSE 244*   < >   < >  --  121*   < >  --  133*  --  135*  --  149*  --  147* 132*  BUN 12   < >   < >  --  18   < >  --  20  --  17  --  20  --  12 13  CREATININE 0.61   < >   < >  --  0.47   < >  --  0.50  --  0.42*  --  0.45  --  0.40* 0.36*  CALCIUM 8.3*   < >   < >  --  8.3*   < >  --  8.1*  --  8.4*  --  8.7*  --  8.9 8.8*  MG 2.2  --   --  2.4 2.3  --  2.1  --   --   --   --   --  1.9  --   --   PHOS 4.9*  --   --  3.0 2.5  --  2.6  --   --   --   --   --   --   --   --    < > = values in this interval not displayed.   GFR: Estimated Creatinine Clearance: 59.4 mL/min (A) (by C-G formula based on SCr of 0.36 mg/dL (L)). Recent Labs  Lab 04/04/20 0251 04/06/20 0418 04/08/20 0423 04/09/20 0350  WBC 9.9 8.3 9.3 10.8*    Liver Function Tests: Recent Labs  Lab 04/03/20 0230  AST 82*  ALT 63*  ALKPHOS 85  BILITOT 1.3*  PROT 9.0*  ALBUMIN 3.0*    ABG    Component Value Date/Time   PHART 7.349 (L) 04/06/2020 1346   PCO2ART 48.2 (H) 04/06/2020 1346   PO2ART 88 04/06/2020 1346   HCO3 26.3 04/06/2020 1346   TCO2 28 04/06/2020 1346   ACIDBASEDEF 4.0 (H) 04/03/2020 0454   O2SAT 96.0 04/06/2020 1346     HbA1C: Hgb A1c MFr Bld  Date/Time Value Ref Range Status  04/03/2020 02:30 AM 6.0 (H) 4.8 - 5.6 % Final    Comment:    (NOTE) Pre diabetes:          5.7%-6.4%  Diabetes:              >6.4%  Glycemic control for   <7.0% adults with diabetes   03/24/2020 02:38 PM 6.1 4.6 - 6.5 % Final    Comment:    Glycemic Control Guidelines for People with Diabetes:Non Diabetic:  <6%Goal of Therapy: <7%Additional Action Suggested:  >8%     CBG: Recent Labs  Lab 04/08/20 1951 04/08/20 2348 04/09/20 0343 04/09/20 0811 04/09/20 1148  GLUCAP 141* 151* 120* 150* 107*   Critical care time 30 mins  Eulis Foster, MD [place] [facility], PGY-2  04/09/2020 12:03 PM

## 2020-04-09 NOTE — Telephone Encounter (Signed)
lmom" [employer] - Neurology said 11 days ago  "[employer] - Neurology said 9 days ago  "No answer unable to reach patient thank you for the referral " [employer] - Neurology said 1 day ago  "Rejection Reason - Patient did not respondBiochemist, clinical - Neurology said 1 day ago

## 2020-04-09 NOTE — Progress Notes (Signed)
Nutrition Follow-up  DOCUMENTATION CODES:   Not applicable  INTERVENTION:   Tube Feeding:  Vital AF 1.2 at 50 ml/hr Pro-Source TF 45 mL BID Provides 112 g of protein, 1520 kcals and 972 mL of free water Meets 100% estimated calorie and protein needs  NUTRITION DIAGNOSIS:   Inadequate oral intake related to inability to eat as evidenced by NPO status.  Being addressed via TF   GOAL:   Patient will meet greater than or equal to 90% of their needs  Met  MONITOR:   Vent status, TF tolerance, Labs, Weight trends, Skin  REASON FOR ASSESSMENT:   Ventilator, Consult Enteral/tube feeding initiation and management  ASSESSMENT:   79 year-old female former smoker with medical hx of HLD, HTN, GERD, osteopenia, psoriasis, rosacea, sclerosus, neuropathy, sleep apnea, arthritis, s/p pacemaker placement, CAD, and TMJ. She was transferred to [facility] from [place] on 6/26 with weakness, numbness, and tingling.  She was recently started on enbrel for psoriasis. Transferred to [facility] on 7/3 for further neurologic assessment.  Required intubation for airway protection with concern for seizure.   6/26 Admit to [facility] 6/28 Started IVIG 7/03 Transferred to [facility] ICU, possible seizures, Intubated 7/06 Code Blue called, pt unresponsive in MRI, Chest tube placed 7/07 Started PLEX for GBS 7/09 Cortrak placed   Noted possible plan for Trach, PLEX continues Patient is currently intubated on ventilator support, precedex for sedation, pt to receive 5 PLEX treatments MV: 8.1 L/min Temp (24hrs), Avg:99.1 F (37.3 C), Min:97 F (36.1 C), Max:100 F (37.8 C)  Propofol: NONE  Vital AF 1.2 at 45 ml/hr, Pro-Stat 30 mL daily via OG tube. Cortrak placed today  Current weight 78.8 kg; admit weight 76 kg  Pt with stage II PI to sacrum  Labs: potassium 3.3, CBGs 120-151 (ICU goal 140-180) Meds: colace, ss novolog    Diet Order:   Diet Order            Diet NPO  time specified  Diet effective now                 EDUCATION NEEDS:   Not appropriate for education at this time  Skin:  Skin Assessment: Skin Integrity Issues: Skin Integrity Issues:: Stage II Stage I: n/a Stage II: sacrum  Last BM:  7/8  Height:   Ht Readings from Last 1 Encounters:  03/27/20 _0  (1.676 m)    Weight:   Wt Readings from Last 1 Encounters:  04/09/20 78.8 kg   BMI:  Body mass index is 28.04 kg/m.  Estimated Nutritional Needs:   Kcal:  1550 kcals  Protein:  91-114 grams  Fluid:  >/= 1.7 L/day   Kerman Passey MS, RDN, LDN, CNSC Registered Dietitian III Clinical Nutrition RD Pager and On-Call Pager Number Located in [place]

## 2020-04-09 NOTE — Procedures (Signed)
Cortrak  Person Inserting Tube:  Jacklynn Barnacle E, RD Tube Type:  Cortrak - 43 inches Tube Location:  Right nare Initial Placement:  Stomach Secured by: Bridle Technique Used to Measure Tube Placement:  Documented cm marking at nare/ corner of mouth Cortrak Secured At:  68 cm    Cortrak Tube Team Note:  Consult received to place a Cortrak feeding tube.   No x-ray is required. RN may begin using tube.   If the tube becomes dislodged please keep the tube and contact the Cortrak team at [email or web address] ([email or web address]) for replacement.  If after hours and replacement cannot be delayed, place a NG tube and confirm placement with an abdominal x-ray.   Jacklynn Barnacle, MS, RD, LDN Pager number available on Amion

## 2020-04-10 DIAGNOSIS — J9602 Acute respiratory failure with hypercapnia: Secondary | ICD-10-CM

## 2020-04-10 DIAGNOSIS — J9601 Acute respiratory failure with hypoxia: Secondary | ICD-10-CM

## 2020-04-10 DIAGNOSIS — Z9911 Dependence on respirator [ventilator] status: Secondary | ICD-10-CM

## 2020-04-10 DIAGNOSIS — E876 Hypokalemia: Secondary | ICD-10-CM

## 2020-04-10 LAB — BASIC METABOLIC PANEL
Anion gap: 8 (ref 5–15)
Anion gap: 8 (ref 5–15)
BUN: 15 mg/dL (ref 8–23)
BUN: 14 mg/dL (ref 8–23)
CO2: 24 mmol/L (ref 22–32)
CO2: 25 mmol/L (ref 22–32)
Calcium: 8.8 mg/dL — ABNORMAL LOW (ref 8.9–10.3)
Calcium: 8.6 mg/dL — ABNORMAL LOW (ref 8.9–10.3)
Chloride: 111 mmol/L (ref 98–111)
Chloride: 109 mmol/L (ref 98–111)
Creatinine, Ser: 0.35 mg/dL — ABNORMAL LOW (ref 0.44–1.00)
Creatinine, Ser: 0.35 mg/dL — ABNORMAL LOW (ref 0.44–1.00)
GFR calc Af Amer: >60 mL/min (ref >60)
GFR calc Af Amer: >60 mL/min (ref >60)
GFR calc non Af Amer: >60 mL/min (ref >60)
GFR calc non Af Amer: >60 mL/min (ref >60)
Glucose, Bld: 139 mg/dL — ABNORMAL HIGH (ref 70–99)
Glucose, Bld: 142 mg/dL — ABNORMAL HIGH (ref 70–99)
Potassium: 3.5 mmol/L (ref 3.5–5.1)
Potassium: 3.3 mmol/L — ABNORMAL LOW (ref 3.5–5.1)
Sodium: 143 mmol/L (ref 135–145)
Sodium: 142 mmol/L (ref 135–145)

## 2020-04-10 LAB — GLUCOSE, CAPILLARY
Glucose-Capillary: 133 mg/dL — ABNORMAL HIGH (ref 70–99)
Glucose-Capillary: 113 mg/dL — ABNORMAL HIGH (ref 70–99)
Glucose-Capillary: 117 mg/dL — ABNORMAL HIGH (ref 70–99)
Glucose-Capillary: 134 mg/dL — ABNORMAL HIGH (ref 70–99)
Glucose-Capillary: 109 mg/dL — ABNORMAL HIGH (ref 70–99)

## 2020-04-10 MED ORDER — CALCIUM GLUCONATE-NACL 2-0.675 GM/100ML-% IV SOLN
2.0000 g | Freq: Once | INTRAVENOUS | Status: AC
  Administered 2020-04-10: 2000 mg via INTRAVENOUS
  Filled 2020-04-10 (×2): qty 100

## 2020-04-10 MED ORDER — ACETAMINOPHEN 325 MG PO TABS
650.0000 mg | ORAL_TABLET | ORAL | Status: DC | PRN
  Filled 2020-04-10: qty 2

## 2020-04-10 MED ORDER — ACD FORMULA A 0.73-2.45-2.2 GM/100ML VI SOLN
1000.0000 mL | Status: DC
  Administered 2020-04-12: 1000 mL
  Filled 2020-04-10 (×2): qty 1000

## 2020-04-10 MED ORDER — SODIUM CHLORIDE 0.9 % IV SOLN
INTRAVENOUS | Status: AC
  Filled 2020-04-10 (×3): qty 200

## 2020-04-10 MED ORDER — POTASSIUM CHLORIDE 10 MEQ/50ML IV SOLN
10.0000 meq | INTRAVENOUS | Status: AC
  Administered 2020-04-10 (×4): 10 meq via INTRAVENOUS
  Filled 2020-04-10: qty 50

## 2020-04-10 MED ORDER — DIPHENHYDRAMINE HCL 25 MG PO CAPS: 25.0000 mg | ORAL_CAPSULE | Freq: Four times a day (QID) | ORAL | Status: DC | PRN

## 2020-04-10 MED ORDER — CALCIUM CARBONATE ANTACID 500 MG PO CHEW: 2.0000 | CHEWABLE_TABLET | ORAL | Status: AC

## 2020-04-10 MED ORDER — HEPARIN SODIUM (PORCINE) 1000 UNIT/ML IJ SOLN
1000.0000 [IU] | Freq: Once | INTRAMUSCULAR | Status: AC
  Administered 2020-04-12: 1000 [IU]

## 2020-04-10 NOTE — Progress Notes (Signed)
NAME:  Alison Huffman, MRN:  [id number], DOB:  [birth year], LOS: 7 ADMISSION DATE:  04/03/2020, CONSULTATION DATE:  04/03/2020 REFERRING MD:  Dr. Hal Hope, [facility], CHIEF COMPLAINT:  Respiratory failure   Brief History   79 yo female former smoker transferred to [facility] from [facility] [place] on 6/26 with weakness, numbness, and tingling after being started on Enbrel for psoriasis. Patient developed severe hyponatremia, had a seizure and was transferred to [facility] on 7/03 for further neurologic assessment.  Required intubation for airway protection and transferred to ICU. 7/06 patient had code blue and sustained bilateral pneumothoraces after bag ventilation for which bilateral chest tubes were placed. Patient started on PLEX 7/7 per neuro recommendation.   Past Medical History  CAD, s/p PM, GERD, HTN, HLD, Psoriasis, Arthritis, OSA  Significant Hospital Events   6/26 Admit to [facility] and seen by neurology 6/28 start IVIG  7/02 seen by nephrology at [facility] >> d/c IVIG due to concern about this causing dilutional hyponatremia 7/03 transfer from [facility]; possible seizure, started  keppra, intubated, transferred to ICU 7/6: unresponsive during MRI, Code Blue called, levophed restarted for 2.5 hours, limited MRI without significant disease findings 7/7: started PLEX treatment for GBS  7/9: Making progress, however still with weak cough, tolerating pressure support ventilation but unable to ventilate adequately with pressure support less than 10 cmH2O, core track tube placed   Consults:  Nephrology [facility]) Neurology  Procedures:  ETT 7/03 >>   Significant Diagnostic Tests:   CT angio head/neck 6/26 >> 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 rate 20, weight 77.4 kg, SpO2 100 %.    Vent Mode: CPAP;PSV FiO2 (%):  [40 %] 40 % Set Rate:  [18 bmp] 18 bmp Vt Set:  [470 mL] 470 mL PEEP:  [5 cmH20] 5 cmH20 Pressure Support:  [5 cmH20-10 cmH20] 5 cmH20 Plateau Pressure:  [14 cmH20-16 cmH20] 14 cmH20   Intake/Output Summary (Last 24 hours) at 04/10/2020 1042 Last data filed at 04/10/2020 0600 Gross per 24 hour  Intake 1811.7 ml  Output 630 ml  Net 1181.7 ml   Filed Weights   04/07/20 0441 04/09/20 0335 04/10/20 0500  Weight: 78.9 kg 78.8 kg 77.4 kg    Examination: General: Pleasant 79 year old white female currently on ventilatory support not in acute distress tolerating pressure support ventilation HEENT normocephalic atraumatic no jugular venous distention appreciated Pulmonary: Scattered rhonchi, mild accessory use on pressure support of 5 particularly after cough or any exertion.  She is titling in the 300-400 range, but exhibiting mild nasal flare and paradoxical efforts with pressure support of 5 the seem to improve after titrating PEEP up to 8 Cardiac: Regular rate and rhythm without murmur rub or gallop Abdomen: Soft nontender no organomegaly positive bowel sounds GU: Clear yellow Neuro: Awake, interactive, moving all extremities.  She does have generalized weakness she is unable to lift her head off from the bed. Resolved Hospital Problem list   Requirement for vasopressors  Bilateral pneumothoraces status post  intubation resolved Hyponatremia  Seizures secondary to hyponatremia Assessment & Plan:   Acute hypoxic respiratory failure with compromised airway 2nd to seizure in setting of hyponatremia and progressive weakness Hx of OSA. Tolerating lower levels of  pressure support but still with some neuromuscular weakness and fatigue not ready for extubation Plan  Continue pressure support ventilation as tolerated Check NIF in a.m. with weaning parameters VAP bundle PAD protocol with RASS goal of 0   Numbness/weakness progressing from lower extremities up with a working diagnosis of GBS, failed IVIG Plan PLEX  started on 7/7, she will complete 5 rounds Serial neuro checks  Intermittent fluid electrolyte imbalance Plan Trend chemistries daily, replace as indicated   Hyperglycemia Plan Sliding scale insulin   Chronic HFpEF, Hx of HTN, HLD. Plan Holding Crestor and Diovan Continuing telemetry monitoring  Mild anemia w/out evidence of bleeding Plan Trend cbc  Hx of psoriasis. Plan Hold Enbrel  Urinary retention:  Plan Foley catheter  Best practice:  Diet: tube feeds DVT prophylaxis: lovenox GI prophylaxis: protonix Mobility: bed rest Code Status: full code Disposition: ICU Family communication: family updated via telephone 7/9  Labs   CBC: Recent Labs  Lab 04/04/20 0251 04/06/20 0418 04/06/20 1346 04/08/20 0423 04/09/20 0350  WBC 9.9 8.3  --  9.3 10.8*  NEUTROABS  --   --   --  7.6 8.5*  HGB 14.6 11.0* 11.2* 10.4* 10.0*  HCT 44.6 33.3* 33.0* 33.1* 30.9*  MCV 91.2 93.3  --  95.1 94.8  PLT 180 164  --  161 149*    Basic Metabolic Panel: Recent Labs  Lab 04/03/20 1349 04/03/20 1720 04/04/20 0251 04/04/20 1706 04/05/20 0309 04/06/20 0418 04/06/20 0418 04/06/20 1346 04/07/20 0336 04/07/20 2101 04/08/20 0423 04/09/20 0350 04/10/20 0514  NA   < >  --  131*  --    < > 136   < > 139 139  --  140 143 143  K   < >  --  4.2  --    < > 3.6   < > 4.0 3.7  --  3.8 3.3* 3.5  CL   < >  --  104  --    < > 104  --   --  105  --  111 113* 111  CO2   < >  --  20*  --    < > 25  --   --  25  --  23 24 24   GLUCOSE   < >  --  121*  --    < > 135*  --   --  149*  --  147* 132* 139*  BUN   < >  --  18  --    < > 17  --    --  20  --  12 13 15   CREATININE   < >  --  0.47  --    < > 0.42*  --   --  0.45  --  0.40* 0.36* 0.35*  CALCIUM   < >  --  8.3*  --    < > 8.4*  --   --  8.7*  --  8.9 8.8* 8.8*  MG  --  2.4 2.3 2.1  --   --   --   --   --  1.9  --   --   --   PHOS  --  3.0 2.5 2.6  --   --   --   --   --   --   --   --   --    < > =  values in this interval not displayed.   GFR: Estimated Creatinine Clearance: 58.9 mL/min (A) (by C-G formula based on SCr of 0.35 mg/dL (L)). Recent Labs  Lab 04/04/20 0251 04/06/20 0418 04/08/20 0423 04/09/20 0350  WBC 9.9 8.3 9.3 10.8*    Liver Function Tests: No results for input(s): AST, ALT, ALKPHOS, BILITOT, PROT, ALBUMIN in the last 168 hours.  ABG    Component Value Date/Time   PHART 7.349 (L) 04/06/2020 1346   PCO2ART 48.2 (H) 04/06/2020 1346   PO2ART 88 04/06/2020 1346   HCO3 26.3 04/06/2020 1346   TCO2 28 04/06/2020 1346   ACIDBASEDEF 4.0 (H) 04/03/2020 0454   O2SAT 96.0 04/06/2020 1346     HbA1C: Hgb A1c MFr Bld  Date/Time Value Ref Range Status  04/03/2020 02:30 AM 6.0 (H) 4.8 - 5.6 % Final    Comment:    (NOTE) Pre diabetes:          5.7%-6.4%  Diabetes:              >6.4%  Glycemic control for   <7.0% adults with diabetes   03/24/2020 02:38 PM 6.1 4.6 - 6.5 % Final    Comment:    Glycemic Control Guidelines for People with Diabetes:Non Diabetic:  <6%Goal of Therapy: <7%Additional Action Suggested:  >8%     CBG: Recent Labs  Lab 04/09/20 1503 04/09/20 2012 04/09/20 2317 04/10/20 0327 04/10/20 0740  GLUCAP 136* 157* 115* 133* 113*   Critical care time 32 min  Erick Colace ACNP-BC [place] Pager # [phone] OR # [phone] if no answer

## 2020-04-10 NOTE — Progress Notes (Signed)
Subjective: No acute events.   Exam: Vitals:   04/10/20 1100 04/10/20 1200  BP: 133/64 130/68  Pulse: 83 78  Resp: (!) 23 (!) 21  Temp:    SpO2: 99% 99%   Gen: In bed, NAD Resp: non-labored breathing, no acute distress Abd: soft, nt  Neuro: MS: awake, alert, shakes/nods appropriately.  SW:VTVNR, EOMI Motor: 2/5 shoudler abduction, bilaterally 4/5 elbow flexion/extension, hand weakness. 3-4/5 throughout bilateral lower extremities.  Sensory: improved, intact to LT  Pertinent Labs: CSF WBC 0 CSF RBC 5 CSF protein 33 Csf Glucose 53 b12 323 Paraneoplastic panel - pending.  Heavy metals negaitve.    Impression: 79 yo F with acute neuropathic exam in the setting of recent enbrel dosing. 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  She did have some mild response to IVIG, which is strongly supportive of an autoimmune nature.  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  Though this is not definite, in someone who is responded is poorly to IVIG as she has, plasma exchange is sometimes considered.  I am typically ambivalent about this, however with the fact that it might remove the embrel providing some additional benefit as well as her continued poor exam, I do favor moving ahead with plasmapheresis at this time.  Recommendations: 1) plasmapheresis, treatment #3 today, #4/5 on Monday 2) neurology will continue to follow  Roland Rack, MD [facility] [phone]  If 7pm- 7am, please page neurology on call as listed in [place].

## 2020-04-11 ENCOUNTER — Inpatient Hospital Stay (HOSPITAL_COMMUNITY): Payer: Medicare Other

## 2020-04-11 DIAGNOSIS — J9690 Respiratory failure, unspecified, unspecified whether with hypoxia or hypercapnia: Secondary | ICD-10-CM

## 2020-04-11 LAB — CBC
HCT: 24.6 % — ABNORMAL LOW (ref 36.0–46.0)
Hemoglobin: 7.6 g/dL — ABNORMAL LOW (ref 12.0–15.0)
MCH: 30 pg (ref 26.0–34.0)
MCHC: 30.9 g/dL (ref 30.0–36.0)
MCV: 97.2 fL (ref 80.0–100.0)
Platelets: 191 10*3/uL (ref 150–400)
RBC: 2.53 MIL/uL — ABNORMAL LOW (ref 3.87–5.11)
RDW: 13.9 % (ref 11.5–15.5)
WBC: 10.4 10*3/uL (ref 4.0–10.5)
nRBC: 0 % (ref 0.0–0.2)

## 2020-04-11 LAB — BASIC METABOLIC PANEL
Anion gap: 10 (ref 5–15)
BUN: 13 mg/dL (ref 8–23)
CO2: 25 mmol/L (ref 22–32)
Calcium: 9 mg/dL (ref 8.9–10.3)
Chloride: 105 mmol/L (ref 98–111)
Creatinine, Ser: 0.38 mg/dL — ABNORMAL LOW (ref 0.44–1.00)
GFR calc Af Amer: >60 mL/min (ref >60)
GFR calc non Af Amer: >60 mL/min (ref >60)
Glucose, Bld: 135 mg/dL — ABNORMAL HIGH (ref 70–99)
Potassium: 3.4 mmol/L — ABNORMAL LOW (ref 3.5–5.1)
Sodium: 140 mmol/L (ref 135–145)

## 2020-04-11 LAB — COMPREHENSIVE METABOLIC PANEL
ALT: 52 U/L — ABNORMAL HIGH (ref 0–44)
AST: 53 U/L — ABNORMAL HIGH (ref 15–41)
Albumin: 3.4 g/dL — ABNORMAL LOW (ref 3.5–5.0)
Alkaline Phosphatase: 153 U/L — ABNORMAL HIGH (ref 38–126)
Anion gap: 7 (ref 5–15)
BUN: 14 mg/dL (ref 8–23)
CO2: 24 mmol/L (ref 22–32)
Calcium: 8.9 mg/dL (ref 8.9–10.3)
Chloride: 111 mmol/L (ref 98–111)
Creatinine, Ser: 0.31 mg/dL — ABNORMAL LOW (ref 0.44–1.00)
GFR calc Af Amer: >60 mL/min (ref >60)
GFR calc non Af Amer: >60 mL/min (ref >60)
Glucose, Bld: 144 mg/dL — ABNORMAL HIGH (ref 70–99)
Potassium: 3.3 mmol/L — ABNORMAL LOW (ref 3.5–5.1)
Sodium: 142 mmol/L (ref 135–145)
Total Bilirubin: 0.9 mg/dL (ref 0.3–1.2)
Total Protein: 5.5 g/dL — ABNORMAL LOW (ref 6.5–8.1)

## 2020-04-11 LAB — GLUCOSE, CAPILLARY
Glucose-Capillary: 100 mg/dL — ABNORMAL HIGH (ref 70–99)
Glucose-Capillary: 109 mg/dL — ABNORMAL HIGH (ref 70–99)
Glucose-Capillary: 125 mg/dL — ABNORMAL HIGH (ref 70–99)
Glucose-Capillary: 125 mg/dL — ABNORMAL HIGH (ref 70–99)
Glucose-Capillary: 127 mg/dL — ABNORMAL HIGH (ref 70–99)
Glucose-Capillary: 137 mg/dL — ABNORMAL HIGH (ref 70–99)
Glucose-Capillary: 141 mg/dL — ABNORMAL HIGH (ref 70–99)

## 2020-04-11 LAB — POCT I-STAT 7, (LYTES, BLD GAS, ICA,H+H)
Acid-Base Excess: 4 mmol/L — ABNORMAL HIGH (ref 0.0–2.0)
Bicarbonate: 27.9 mmol/L (ref 20.0–28.0)
Calcium, Ion: 1.25 mmol/L (ref 1.15–1.40)
HCT: 32 % — ABNORMAL LOW (ref 36.0–46.0)
Hemoglobin: 10.9 g/dL — ABNORMAL LOW (ref 12.0–15.0)
O2 Saturation: 98 %
Patient temperature: 99.2
Potassium: 3.3 mmol/L — ABNORMAL LOW (ref 3.5–5.1)
Sodium: 145 mmol/L (ref 135–145)
TCO2: 29 mmol/L (ref 22–32)
pCO2 arterial: 38.4 mmHg (ref 32.0–48.0)
pH, Arterial: 7.47 — ABNORMAL HIGH (ref 7.350–7.450)
pO2, Arterial: 102 mmHg (ref 83.0–108.0)

## 2020-04-11 LAB — MAGNESIUM
Magnesium: 2 mg/dL (ref 1.7–2.4)
Magnesium: 1.9 mg/dL (ref 1.7–2.4)

## 2020-04-11 LAB — LACTATE DEHYDROGENASE: LDH: 120 U/L (ref 98–192)

## 2020-04-11 MED ORDER — POTASSIUM CHLORIDE 20 MEQ/15ML (10%) PO SOLN
40.0000 meq | Freq: Once | ORAL | Status: AC
  Administered 2020-04-11: 40 meq
  Filled 2020-04-11: qty 30

## 2020-04-11 MED ORDER — RACEPINEPHRINE HCL 2.25 % IN NEBU
INHALATION_SOLUTION | RESPIRATORY_TRACT | Status: AC
  Filled 2020-04-11: qty 0.5

## 2020-04-11 MED ORDER — FUROSEMIDE 10 MG/ML IJ SOLN
40.0000 mg | Freq: Once | INTRAMUSCULAR | Status: AC
  Administered 2020-04-11: 40 mg via INTRAVENOUS
  Filled 2020-04-11: qty 4

## 2020-04-11 MED ORDER — MAGNESIUM SULFATE 2 GM/50ML IV SOLN
2.0000 g | Freq: Once | INTRAVENOUS | Status: AC
  Administered 2020-04-11: 2 g via INTRAVENOUS
  Filled 2020-04-11: qty 50

## 2020-04-11 MED ORDER — POTASSIUM CHLORIDE CRYS ER 20 MEQ PO TBCR
40.0000 meq | EXTENDED_RELEASE_TABLET | Freq: Two times a day (BID) | ORAL | Status: DC
  Administered 2020-04-11: 40 meq via ORAL
  Filled 2020-04-11 (×2): qty 2

## 2020-04-11 NOTE — Procedures (Signed)
Extubation Procedure Note  Patient Details:   Name: Alison Huffman DOB: 04-Mar-1941 MRN: [id number]   Airway Documentation:    Vent end date: 04/11/20 Vent end time: 1316   Evaluation  O2 sats: stable throughout Complications: No apparent complications Patient did tolerate procedure well. Bilateral Breath Sounds: Diminished   Yes   Pt extubated per MD order to [facility]. Positive cuff leak heard prior to extubation. RN and RT at bedside, pt has a strong cough and able to use yankauer to assist with removal of secretions. Pt is able to whisper after extubation and follow commands. RT will continue to monitor.  Esperanza Sheets T 04/11/2020, 1:19 PM

## 2020-04-11 NOTE — Progress Notes (Signed)
Pt c/o CP 3/10. When asked to point to area of pain she points to the areas where her chest tubes where.  Got EKG and informed Dr. Tamala Julian and Marni Griffon NP.  Irven Baltimore, RN

## 2020-04-11 NOTE — Progress Notes (Signed)
Pt c/o of sob.  PT rhythm is ST in the 120-130s and O2 sat is 97% on HHFNC.  Informed Dr Tamala Julian.  Orders given to place pt on BIPAP.  Irven Baltimore, RN

## 2020-04-11 NOTE — Progress Notes (Signed)
Patient in respiratory distress. RR 35 and shallow.. Placed on BIPAP at charted settings. CCM notified.

## 2020-04-11 NOTE — Progress Notes (Signed)
NIF -40 

## 2020-04-11 NOTE — Progress Notes (Signed)
[facility] Progress Note Patient Name: Alison Huffman DOB: 10-01-1941 MRN: [id number]   Date of Service  04/11/2020  HPI/Events of Note  Patient with extubated earlier today and now  On BIPAP. When I camera'd into the room the ground crew was in the room assessing the patient, RR and tidal volumes acceptable currently per ground team assessment. BP 154/74, HR 128.  eICU Interventions  No intervention at this time.        Kerry Kass Yaroslav Gombos 04/11/2020, 8:09 PM

## 2020-04-11 NOTE — Progress Notes (Signed)
Pt having short runs of Vtach 2-3  beat runs often.  Pt asymptomatic with no palpitations.  Informed Dr. Tamala Julian who came bedside to assess pt.  See new orders.  Irven Baltimore, RN

## 2020-04-11 NOTE — Progress Notes (Signed)
[place] Progress Note Patient Name: Alison Huffman DOB: [birth year] MRN: [id number]   Date of Service  04/11/2020  HPI/Events of Note  K 3.3, creatinine 0.32  eICU Interventions  Ordered K 40 meqs per feeding tube Add o magnesium level     Intervention Category Minor Interventions: Electrolytes abnormality - evaluation and management  Judd Lien 04/11/2020, 6:31 AM

## 2020-04-11 NOTE — Progress Notes (Addendum)
NAME:  Alison Huffman, MRN:  [id number], DOB:  04/08/1941, LOS: 89 ADMISSION DATE:  04/03/2020, CONSULTATION DATE:  04/03/2020 REFERRING MD:  Dr. Hal Hope, [facility], CHIEF COMPLAINT:  Respiratory failure   Brief History   79 yo female former smoker transferred to [facility] from [facility] on 6/26 with weakness, numbness, and tingling after being started on Enbrel for psoriasis. Patient developed severe hyponatremia, had a seizure and was transferred to [facility] on 7/03 for further neurologic assessment.  Required intubation for airway protection and transferred to ICU. 7/06 patient had code blue and sustained bilateral pneumothoraces after bag ventilation for which bilateral chest tubes were placed. Patient started on PLEX 7/7 per neuro recommendation.   Past Medical History  CAD, s/p PM, GERD, HTN, HLD, Psoriasis, Arthritis, OSA  Significant Hospital Events   6/26 Admit to [facility] and seen by neurology 6/28 start IVIG  7/02 seen by nephrology at [facility] >> d/c IVIG due to concern about this causing dilutional hyponatremia 7/03 transfer from [facility]; possible seizure, started  keppra, intubated, transferred to ICU 7/6: unresponsive during MRI, Code Blue called, levophed restarted for 2.5 hours, limited MRI without significant disease findings 7/7: started PLEX treatment for GBS  7/9: Making progress, however still with weak cough, tolerating pressure support ventilation but unable to ventilate adequately with pressure support less than 10 cmH2O, core track tube placed  7/11 NIF -40 good F/VT mechanics less accessory  use but now w/ pulm edema Consults:  Nephrology [facility]) Neurology  Procedures:  ETT 7/03 >>   Significant Diagnostic Tests:   CT angio head/neck 6/26 >> 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 rate 19, weight 78.1 kg, SpO2 99 %.    Vent Mode: CPAP;PSV FiO2 (%):  [40 %] 40 % Set Rate:  [18 bmp] 18 bmp Vt Set:  [470 mL] 470 mL PEEP:  [5 cmH20] 5 cmH20 Pressure Support:  [5 cmH20-8 cmH20] 5 cmH20 Plateau Pressure:  [16 cmH20] 16 cmH20   Intake/Output Summary (Last 24 hours) at 04/11/2020 0906 Last data filed at 04/11/2020 0500 Gross per 24 hour  Intake 1379.58 ml  Output 1275 ml  Net 104.58 ml   Filed Weights   04/09/20 0335 04/10/20 0500 04/11/20 0456  Weight: 78.8 kg 77.4 kg 78.1 kg    Examination: General Pleasant 79 year old female resting on pressure support ventilation no acute distress  HEENT normocephalic atraumatic no jugular venous distention appreciated mucous membranes moist orally intubated  Pulmonary coarse scattered rhonchi bilaterally.  Currently on pressure support ventilation of 5, PEEP 5, tidal volume in the mid 400-500 range.  Accessory muscle use less obvious today, no nasal flare appreciated  Cardiac regular rate and rhythm without murmur rub or gallop  Abdomen soft nontender  Extremities warm dry brisk capillary refill does have dependent edema today pulses are briskly palpable  Neuro awake seemingly oriented, interactive.  Moves all extremities but persistent weakness  Still not able to lift  head off bed   Resolved Hospital Problem list   Requirement for vasopressors  Bilateral pneumothoraces status post intubation resolved Hyponatremia  Seizures secondary to hyponatremia Assessment & Plan:   Acute hypoxic respiratory failure with compromised  airway 2nd to seizure in setting of hyponatremia and progressive weakness Hx of OSA. Tolerating pressure support well.  Weaning parameters are encouraging with acceptable frequency tidal volume ratio Portable chest x-ray personally reviewed: Bilateral pulmonary edema, this is new Plan  IV Lasix  Continue pressure support ventilation and reassess later today for possible extubation, if not suspect we will be able to extubate her in the next 24 hours  Likely bridge post extubation w/ BIPAP  Continue NIF checks daily  Am cxr  Sputum culture  VAP bundle  PAD protocol RASS goal 0    Numbness/weakness progressing from lower extremities up with a working diagnosis of GBS, failed IVIG Plan PLE X 37/7 she will complete 7 rounds Cont serial neuro checks   Intermittent fluid electrolyte imbalance Plan Trend and replace as indicated   Hyperglycemia Plan ssi    Chronic HFpEF, Hx of HTN, HLD. Plan Holding Crestor and Diovan Continuing telemetry monitoring Given pulmonary edema we will repeat echocardiogram  Mild anemia w/out evidence of bleeding-->suspect some degree of either hemodilution or even some hemolysis w/ PLEX Plan Trend CBC Ck LDH  Hx of psoriasis. Plan Hold Enbrel, some question as to whether this could have been a contributing factor  Urinary retention:  Plan Keep Foley  Best practice:  Diet: tube feeds DVT prophylaxis: lovenox GI prophylaxis: protonix Mobility: bed rest Code Status: full code Disposition: ICU Family communication: family updated via telephone 7/9  Labs   CBC: Recent Labs  Lab 04/06/20 0418 04/06/20 1346 04/08/20 0423 04/09/20 0350 04/11/20 0454  WBC 8.3  --  9.3 10.8* 10.4  NEUTROABS  --   --  7.6 8.5*  --   HGB 11.0* 11.2* 10.4* 10.0* 7.6*  HCT 33.3* 33.0* 33.1* 30.9* 24.6*  MCV 93.3  --  95.1 94.8 97.2  PLT 164  --  161 149* 956    Basic Metabolic Panel: Recent Labs  Lab 04/04/20 1706 04/05/20 0309 04/07/20 0336  04/07/20 2101 04/08/20 0423 04/09/20 0350 04/10/20 0514 04/10/20 1255 04/11/20 0454  NA  --    < >   < >  --  140 143 143 142 142  K  --    < >   < >  --  3.8 3.3* 3.5 3.3* 3.3*  CL  --    < >   < >  --  111 113* 111 109 111  CO2  --    < >   < >  --  23 24 24 25 24   GLUCOSE  --    < >   < >  --  147* 132* 139* 142* 144*  BUN  --    < >   < >  --  12 13 15 14 14   CREATININE  --    < >   < >  --  0.40* 0.36* 0.35* 0.35* 0.31*  CALCIUM  --    < >   < >  --  8.9 8.8* 8.8* 8.6* 8.9  MG 2.1  --   --  1.9  --   --   --   --  2.0  PHOS 2.6  --   --   --   --   --   --   --   --    < > =  values in this interval not displayed.   GFR: Estimated Creatinine Clearance: 59.1 mL/min (A) (by C-G formula based on SCr of 0.31 mg/dL (L)). Recent Labs  Lab 04/06/20 0418 04/08/20 0423 04/09/20 0350 04/11/20 0454  WBC 8.3 9.3 10.8* 10.4    Liver Function Tests: Recent Labs  Lab 04/11/20 0454  AST 53*  ALT 52*  ALKPHOS 153*  BILITOT 0.9  PROT 5.5*  ALBUMIN 3.4*    ABG    Component Value Date/Time   PHART 7.349 (L) 04/06/2020 1346   PCO2ART 48.2 (H) 04/06/2020 1346   PO2ART 88 04/06/2020 1346   HCO3 26.3 04/06/2020 1346   TCO2 28 04/06/2020 1346   ACIDBASEDEF 4.0 (H) 04/03/2020 0454   O2SAT 96.0 04/06/2020 1346     HbA1C: Hgb A1c MFr Bld  Date/Time Value Ref Range Status  04/03/2020 02:30 AM 6.0 (H) 4.8 - 5.6 % Final    Comment:    (NOTE) Pre diabetes:          5.7%-6.4%  Diabetes:              >6.4%  Glycemic control for   <7.0% adults with diabetes   03/24/2020 02:38 PM 6.1 4.6 - 6.5 % Final    Comment:    Glycemic Control Guidelines for People with Diabetes:Non Diabetic:  <6%Goal of Therapy: <7%Additional Action Suggested:  >8%     CBG: Recent Labs  Lab 04/10/20 1655 04/10/20 2002 04/11/20 0046 04/11/20 0426 04/11/20 0823  GLUCAP 134* 109* 127* 137* 100*   Critical care time 32 min  Erick Colace ACNP-BC [place] Pager #  [phone] OR # [phone] if no answer

## 2020-04-12 ENCOUNTER — Inpatient Hospital Stay (HOSPITAL_COMMUNITY): Payer: Medicare Other

## 2020-04-12 DIAGNOSIS — J9600 Acute respiratory failure, unspecified whether with hypoxia or hypercapnia: Secondary | ICD-10-CM

## 2020-04-12 LAB — CBC
HCT: 33.3 % — ABNORMAL LOW (ref 36.0–46.0)
Hemoglobin: 10.5 g/dL — ABNORMAL LOW (ref 12.0–15.0)
MCH: 29.7 pg (ref 26.0–34.0)
MCHC: 31.5 g/dL (ref 30.0–36.0)
MCV: 94.3 fL (ref 80.0–100.0)
Platelets: 279 10*3/uL (ref 150–400)
RBC: 3.53 MIL/uL — ABNORMAL LOW (ref 3.87–5.11)
RDW: 13.9 % (ref 11.5–15.5)
WBC: 14.7 10*3/uL — ABNORMAL HIGH (ref 4.0–10.5)
nRBC: 0 % (ref 0.0–0.2)

## 2020-04-12 LAB — COMPREHENSIVE METABOLIC PANEL
ALT: 62 U/L — ABNORMAL HIGH (ref 0–44)
AST: 48 U/L — ABNORMAL HIGH (ref 15–41)
Albumin: 3.6 g/dL (ref 3.5–5.0)
Alkaline Phosphatase: 150 U/L — ABNORMAL HIGH (ref 38–126)
Anion gap: 10 (ref 5–15)
BUN: 14 mg/dL (ref 8–23)
CO2: 30 mmol/L (ref 22–32)
Calcium: 9.1 mg/dL (ref 8.9–10.3)
Chloride: 106 mmol/L (ref 98–111)
Creatinine, Ser: 0.48 mg/dL (ref 0.44–1.00)
GFR calc Af Amer: >60 mL/min (ref >60)
GFR calc non Af Amer: >60 mL/min (ref >60)
Glucose, Bld: 128 mg/dL — ABNORMAL HIGH (ref 70–99)
Potassium: 3.6 mmol/L (ref 3.5–5.1)
Sodium: 146 mmol/L — ABNORMAL HIGH (ref 135–145)
Total Bilirubin: 1 mg/dL (ref 0.3–1.2)
Total Protein: 6.3 g/dL — ABNORMAL LOW (ref 6.5–8.1)

## 2020-04-12 LAB — BASIC METABOLIC PANEL
Anion gap: 13 (ref 5–15)
BUN: 17 mg/dL (ref 8–23)
CO2: 27 mmol/L (ref 22–32)
Calcium: 9.1 mg/dL (ref 8.9–10.3)
Chloride: 107 mmol/L (ref 98–111)
Creatinine, Ser: 0.46 mg/dL (ref 0.44–1.00)
GFR calc Af Amer: >60 mL/min (ref >60)
GFR calc non Af Amer: >60 mL/min (ref >60)
Glucose, Bld: 113 mg/dL — ABNORMAL HIGH (ref 70–99)
Potassium: 3.9 mmol/L (ref 3.5–5.1)
Sodium: 147 mmol/L — ABNORMAL HIGH (ref 135–145)

## 2020-04-12 LAB — GLUCOSE, CAPILLARY
Glucose-Capillary: 98 mg/dL (ref 70–99)
Glucose-Capillary: 132 mg/dL — ABNORMAL HIGH (ref 70–99)
Glucose-Capillary: 131 mg/dL — ABNORMAL HIGH (ref 70–99)
Glucose-Capillary: 112 mg/dL — ABNORMAL HIGH (ref 70–99)
Glucose-Capillary: 130 mg/dL — ABNORMAL HIGH (ref 70–99)
Glucose-Capillary: 89 mg/dL (ref 70–99)

## 2020-04-12 LAB — ECHOCARDIOGRAM COMPLETE: Weight: 2574.97 oz

## 2020-04-12 MED ORDER — GUAIFENESIN 100 MG/5ML PO SOLN
5.0000 mL | ORAL | Status: DC | PRN
  Administered 2020-04-12 – 2020-04-16 (×3): 100 mg
  Filled 2020-04-12 (×2): qty 10
  Filled 2020-04-12: qty 5

## 2020-04-12 MED ORDER — IPRATROPIUM-ALBUTEROL 0.5-2.5 (3) MG/3ML IN SOLN
3.0000 mL | Freq: Four times a day (QID) | RESPIRATORY_TRACT | Status: DC | PRN
  Administered 2020-04-12 – 2020-04-13 (×3): 3 mL via RESPIRATORY_TRACT
  Filled 2020-04-12 (×3): qty 3

## 2020-04-12 MED ORDER — DM-GUAIFENESIN ER 30-600 MG PO TB12
1.0000 | ORAL_TABLET | Freq: Two times a day (BID) | ORAL | Status: DC
  Filled 2020-04-12: qty 1

## 2020-04-12 MED ORDER — ACD FORMULA A 0.73-2.45-2.2 GM/100ML VI SOLN
1000.0000 mL | Status: DC
  Filled 2020-04-12: qty 1000

## 2020-04-12 MED ORDER — ORAL CARE MOUTH RINSE
15.0000 mL | Freq: Two times a day (BID) | OROMUCOSAL | Status: DC
  Administered 2020-04-12 – 2020-04-17 (×10): 15 mL via OROMUCOSAL

## 2020-04-12 MED ORDER — VANCOMYCIN HCL 1250 MG/250ML IV SOLN
1250.0000 mg | Freq: Two times a day (BID) | INTRAVENOUS | Status: DC
  Administered 2020-04-12 – 2020-04-13 (×2): 1250 mg via INTRAVENOUS
  Filled 2020-04-12 (×3): qty 250

## 2020-04-12 MED ORDER — METHYLPREDNISOLONE SODIUM SUCC 40 MG IJ SOLR
40.0000 mg | Freq: Two times a day (BID) | INTRAMUSCULAR | Status: AC
  Administered 2020-04-12 – 2020-04-14 (×4): 40 mg via INTRAVENOUS
  Filled 2020-04-12 (×4): qty 1

## 2020-04-12 MED ORDER — SODIUM CHLORIDE 0.9 % IV SOLN
INTRAVENOUS | Status: AC
  Filled 2020-04-12: qty 50
  Filled 2020-04-12 (×2): qty 200

## 2020-04-12 MED ORDER — CALCIUM CARBONATE ANTACID 500 MG PO CHEW
2.0000 | CHEWABLE_TABLET | ORAL | Status: AC
  Filled 2020-04-12: qty 2

## 2020-04-12 MED ORDER — FUROSEMIDE 10 MG/ML IJ SOLN
40.0000 mg | Freq: Once | INTRAMUSCULAR | Status: AC
  Administered 2020-04-12: 40 mg via INTRAVENOUS
  Filled 2020-04-12: qty 4

## 2020-04-12 MED ORDER — CALCIUM GLUCONATE-NACL 2-0.675 GM/100ML-% IV SOLN
2.0000 g | Freq: Once | INTRAVENOUS | Status: AC
  Administered 2020-04-12: 2000 mg via INTRAVENOUS
  Filled 2020-04-12: qty 100

## 2020-04-12 MED ORDER — DIPHENHYDRAMINE HCL 25 MG PO CAPS: 25.0000 mg | ORAL_CAPSULE | Freq: Four times a day (QID) | ORAL | Status: DC | PRN

## 2020-04-12 MED ORDER — CHLORHEXIDINE GLUCONATE 0.12 % MT SOLN
15.0000 mL | Freq: Two times a day (BID) | OROMUCOSAL | Status: DC
  Administered 2020-04-12 – 2020-04-18 (×13): 15 mL via OROMUCOSAL
  Filled 2020-04-12 (×8): qty 15

## 2020-04-12 MED ORDER — HEPARIN SODIUM (PORCINE) 1000 UNIT/ML IJ SOLN
1000.0000 [IU] | Freq: Once | INTRAMUSCULAR | Status: AC
  Administered 2020-04-12: 1000 [IU]

## 2020-04-12 MED ORDER — DEXMEDETOMIDINE HCL IN NACL 400 MCG/100ML IV SOLN
0.4000 ug/kg/h | INTRAVENOUS | Status: DC
  Administered 2020-04-12 – 2020-04-13 (×3): 0.4 ug/kg/h via INTRAVENOUS
  Filled 2020-04-12 (×3): qty 100

## 2020-04-12 MED ORDER — RACEPINEPHRINE HCL 2.25 % IN NEBU
INHALATION_SOLUTION | RESPIRATORY_TRACT | Status: AC
  Administered 2020-04-12: 0.5 mL
  Filled 2020-04-12: qty 0.5

## 2020-04-12 MED ORDER — ACETAMINOPHEN 325 MG PO TABS: 650.0000 mg | ORAL_TABLET | ORAL | Status: DC | PRN

## 2020-04-12 NOTE — Progress Notes (Signed)
[place] Progress Note Patient Name: GLADYSE CORVIN DOB: [birth year] MRN: [id number]   Date of Service  04/12/2020  HPI/Events of Note  Productive cough  eICU Interventions  PRN Mucinex ordered via NG tube.        Kerry Kass Salimatou Simone 04/12/2020, 12:44 AM

## 2020-04-12 NOTE — Progress Notes (Signed)
Echocardiogram 2D Echocardiogram has been performed.  Oneal Deputy Jalayia Bagheri 04/12/2020, 10:24 AM

## 2020-04-12 NOTE — Progress Notes (Signed)
[place] Progress Note Patient Name: ADALEEN HULGAN DOB: Feb 08, 1941 MRN: [id number]   Date of Service  04/12/2020  HPI/Events of Note  Patient with difficulty clearing secretions , and some wheezing on auscultation.  eICU Interventions  Naso-tracheal suctioning PRN ordered, Duonebs ordered Q 6 hours PRN.        Kerry Kass Hayzlee Mcsorley 04/12/2020, 1:48 AM

## 2020-04-12 NOTE — Progress Notes (Signed)
NTS pt per MD.  Suctioned sm th pink tinged/tan secretions

## 2020-04-12 NOTE — Progress Notes (Signed)
PCCM interval progress:  Pt rounded on twice overnight, she has been saturating well and on bipap earlier.  Later overnight was having trouble tolerating bipap mask so was placed on HFNC.  She has some stridor and increased dyspnea and tachypnea, this improved after suctioning, racemic epi and duoneb.  Given Lasix 40mg , after this RN reported pt had decreased WOB and appeared much more comfortable.  Discussed with PCCM attending MD, she remains at risk for re-intubation, but stable at this time.   Otilio Carpen Lyndel Sarate, PA-C

## 2020-04-12 NOTE — Progress Notes (Addendum)
NAME:  Alison Huffman, MRN:  [id number], DOB:  October 21, 1940, LOS: 59 ADMISSION DATE:  04/03/2020, CONSULTATION DATE:  04/03/2020 REFERRING MD:  Dr. Hal Hope, [facility], CHIEF COMPLAINT:  Respiratory failure   Brief History   79 yo female former smoker transferred to [facility] from [facility] on 6/26 with weakness, numbness, and tingling after being started on Enbrel for psoriasis. Patient developed severe hyponatremia, had a seizure and was transferred to [facility] on 7/03 for further neurologic assessment.  Required intubation for airway protection and transferred to ICU. 7/06 patient had code blue and sustained bilateral pneumothoraces after bag ventilation for which bilateral chest tubes were placed. Patient started on PLEX 7/7 per neuro recommendation.   Past Medical History  CAD, s/p PM, GERD, HTN, HLD, Psoriasis, Arthritis, OSA  Significant Hospital Events   6/26 Admit to [facility] and seen by neurology 6/28 start IVIG  7/02 seen by nephrology at [facility] >> d/c IVIG due to concern about this causing dilutional hyponatremia 7/03 transfer from [facility]; possible seizure, started  keppra, intubated, transferred to ICU 7/6: unresponsive during MRI, Code Blue called, levophed restarted for 2.5 hours, limited MRI without significant disease findings 7/7: started PLEX treatment for GBS  7/9: Making progress, however still with weak cough, tolerating pressure support ventilation but unable to ventilate adequately with pressure support less than 10 cmH2O, core track tube placed  7/11 NIF -40 good F/VT mechanics less accessory  use but now w/ pulm edema Consults:  Nephrology [place]) Neurology  Procedures:  ETT 7/03 >>   Significant Diagnostic Tests:   CT angio head/neck 6/26 >> 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 rate (!) 22, weight 73 kg, SpO2 98 %.    Vent Mode: PSV;CPAP FiO2 (%):  [30 %-40 %] 30 % PEEP:  [5 cmH20] 5 cmH20 Pressure Support:  [5 cmH20] 5 cmH20   Intake/Output Summary (Last 24 hours) at 04/12/2020 1139 Last data filed at 04/12/2020 0900 Gross per 24 hour  Intake 1107.57 ml  Output 3750 ml  Net -2642.43 ml   Filed Weights   04/10/20 0500 04/11/20 0456 04/12/20 0500  Weight: 77.4 kg 78.1 kg 73 kg    Examination: General : elderly female, alert and communicating nonverbally, in NAD HEENT: no longer intubated with HFNC mask in place   Pulmonary : bilateral and diffuse rhonchi, intermittently coughing  Cardiac: RRR   Abdomen: soft and non tender   Extremities: no rashes, skin is warm Neuro : patient is awake and interactive, visiting with niece, able to follow commands, grip strength improved, able to move all extremities, upper extremities with 4/5 strength, able to left LEs against gravity only    Resolved Hospital Problem list   Requirement for vasopressors  Bilateral pneumothoraces status post intubation resolved Hyponatremia  Seizures secondary to hyponatremia  Assessment & Plan:   Acute hypoxic respiratory failure with compromised airway 2nd to seizure in setting of hyponatremia and progressive weakness Hx of  OSA. CXR shows atelectasis without much change in interval since last imaging. Patient extubated with some stridor. Sputum culture with moderate staph aureus, sensitivities pending, will treat with antimicrobial.  - start Vanc for staph aureus on sputum culture  - solumedrol  45m BID IV for 48 hours for stridor  - Continue NIF checks daily  - VAP bundle  - PAD protocol RASS goal 0   Numbness/weakness progressing from lower extremities up with a working diagnosis of GBS, failed IVIG - PLEX 4/5 treatment today, next on 7/14  -  neuro checks   Elevated Alk Phos  Alk Pho 150, AST 48 and ALT 62.  - RUQ ultrasound   Intermittent fluid electrolyte imbalance, hyponatremia resolved  - Trend and replace as indicated   Hyperglycemia  - moderate SSI  Chronic HFpEF, Hx of HTN, HLD. - Holding Crestor and Diovan - Continuing telemetry monitoring  Mild anemia w/out evidence of bleeding-->suspect some degree of either hemodilution or even some hemolysis w/ PLEX - Trend CBC  Hx of psoriasis. Hold Enbrel, some question as to whether this could have been a contributing factor  Urinary retention:  Keep Foley  Best practice:  Diet: tube feeds DVT prophylaxis: lovenox GI prophylaxis: protonix Mobility: bed rest Code Status: full code Disposition: ICU Family communication: family and patient updated at bedside 7/12  Labs   CBC: Recent Labs  Lab 04/06/20 0418 04/06/20 1346 04/08/20 0423 04/09/20 0350 04/11/20 0454 04/11/20 2115 04/12/20 0552  WBC 8.3  --  9.3 10.8* 10.4  --  14.7*  NEUTROABS  --   --  7.6 8.5*  --   --   --   HGB 11.0*   < > 10.4* 10.0* 7.6* 10.9* 10.5*  HCT 33.3*   < > 33.1* 30.9* 24.6* 32.0* 33.3*  MCV 93.3  --  95.1 94.8 97.2  --  94.3  PLT 164  --  161 149* 191  --  279   < > = values in this interval not displayed.    Basic Metabolic Panel: Recent Labs  Lab 04/07/20 2101 04/08/20 0423 04/10/20 0514 04/10/20 0514 04/10/20 1255 04/11/20 0454 04/11/20 1326 04/11/20 2115 04/12/20 0552  NA  --    < > 143   < > 142 142 140 145 146*  K  --    < > 3.5   < > 3.3* 3.3* 3.4* 3.3* 3.6  CL  --    < > 111  --  109 111 105  --  106  CO2  --    < > 24  --  25 24 25   --  30  GLUCOSE  --    < > 139*  --  142* 144* 135*  --  128*    BUN  --    < > 15  --  14 14 13   --  14  CREATININE  --    < > 0.35*  --  0.35* 0.31* 0.38*  --  0.48  CALCIUM  --    < > 8.8*  --  8.6* 8.9 9.0  --  9.1  MG 1.9  --   --   --   --  2.0 1.9  --   --    < > = values in this interval not displayed.   GFR: Estimated Creatinine Clearance: 57.4 mL/min (by C-G formula based on SCr of 0.48 mg/dL). Recent Labs  Lab 04/08/20 0423 04/09/20 0350 04/11/20 0454 04/12/20 0552  WBC 9.3 10.8*  10.4 14.7*    Liver Function Tests: Recent Labs  Lab 04/11/20 0454 04/12/20 0552  AST 53* 48*  ALT 52* 62*  ALKPHOS 153* 150*  BILITOT 0.9 1.0  PROT 5.5* 6.3*  ALBUMIN 3.4* 3.6    ABG    Component Value Date/Time   PHART 7.470 (H) 04/11/2020 2115   PCO2ART 38.4 04/11/2020 2115   PO2ART 102 04/11/2020 2115   HCO3 27.9 04/11/2020 2115   TCO2 29 04/11/2020 2115   ACIDBASEDEF 4.0 (H) 04/03/2020 0454   O2SAT 98.0 04/11/2020 2115     HbA1C: Hgb A1c MFr Bld  Date/Time Value Ref Range Status  04/03/2020 02:30 AM 6.0 (H) 4.8 - 5.6 % Final    Comment:    (NOTE) Pre diabetes:          5.7%-6.4%  Diabetes:              >6.4%  Glycemic control for   <7.0% adults with diabetes   03/24/2020 02:38 PM 6.1 4.6 - 6.5 % Final    Comment:    Glycemic Control Guidelines for People with Diabetes:Non Diabetic:  <6%Goal of Therapy: <7%Additional Action Suggested:  >8%     CBG: Recent Labs  Lab 04/11/20 1927 04/11/20 2325 04/12/20 0408 04/12/20 0733 04/12/20 1115  GLUCAP 125* 125* 98 132* 131*   Critical care time 30 min  Eulis Foster, MD  [place]  PGY-2 04/12/20   ---------------------------------------------------------------  Attending note: I have seen and examined the patient. History, labs and imaging reviewed.  79 year old with weakness, respiratory failure, possible GBS s/p IVIG.  Now on Plex treatment Extubated yesterday. Was placed on HHF and Bipap overnight.  Had some stridor post  extubation.  Blood pressure 126/62, pulse 88, temperature 98.7 F (37.1 C), temperature source Axillary, resp. rate (!) 22, weight 73 kg, SpO2 99 %. Gen:      No acute distress HEENT:  EOMI, sclera anicteric Neck:     No masses; no thyromegaly Lungs:    Clear to auscultation bilaterally; normal respiratory effort CV:         Regular rate and rhythm; no murmurs Abd:      + bowel sounds; soft, non-tender; no palpable masses, no distension Ext:    No edema; adequate peripheral perfusion Skin:      Warm and dry; no rash Neuro: alert and oriented x 3 Psych: normal mood and affect   Labs/Imaging personally reviewed, significant for Sodium 147, BUN/creatinine 17/0.46, WBC 14.7, hemoglobin 10.5, platelets 279 Chest x-ray 7/12-bibasal atelectasis.  Assessment/plan: Acute hypoxic respiratory failure Staff aureus pneumonia Start vancomycin Has mild stridor post extubation.  Starting Solu-Medrol Follow NIF test  Progressive weakness, [place] syndrome Continuing on Plex treatment per neurology  Elevated LFTs Check right upper quadrant ultrasound  The patient is critically ill with multiple organ systems failure and requires high complexity decision making for assessment and support, frequent evaluation and titration of therapies, application of advanced monitoring technologies and extensive interpretation of multiple databases.  Critical care time - 35 mins. This represents my time independent of the NPs time taking care of the pt.  Marshell Garfinkel MD [facility] 04/12/2020, 10:49 AM

## 2020-04-12 NOTE — Progress Notes (Signed)
Pharmacy Antibiotic Note  Alison Huffman is a 79 y.o. female with concern for PNA - staph aureus in sputum, no susceptibilities yet  Good renal fx  Weight: 73 kg (160 lb 15 oz)  Temp (24hrs), Avg:98.6 F (37 C), Min:97.9 F (36.6 C), Max:99.2 F (37.3 C)  Recent Labs  Lab 04/06/20 0418 04/07/20 0336 04/08/20 0423 04/08/20 0423 04/09/20 0350 04/09/20 0350 04/10/20 0514 04/10/20 1255 04/11/20 0454 04/11/20 1326 04/12/20 0552  WBC 8.3  --  9.3  --  10.8*  --   --   --  10.4  --  14.7*  CREATININE 0.42*   < > 0.40*   < > 0.36*   < > 0.35* 0.35* 0.31* 0.38* 0.48   < > = values in this interval not displayed.    Estimated Creatinine Clearance: 57.4 mL/min (by C-G formula based on SCr of 0.48 mg/dL).    Allergies  Allergen Reactions  . Nickel    Plan: Vanc 1250 mg q 24h est auc 495 Monitor renal fx lvls suscept.  Barth Kirks, PharmD, BCPS, BCCCP Clinical Pharmacist [phone]  Please check AMION for all [place] numbers  04/12/2020 1:21 PM

## 2020-04-12 NOTE — Progress Notes (Signed)
Patient wanted me to notify the physicians that she is having chest tightness bilaterally and was having this discomfort for about a month prior to this admission. I have assured the patient that we are monitoring her and treating her pneumonia with new antibiotics. I instructed the patient and her family that pneumonia can be painful in the chest and patient has also had bilateral chest tubes removed. I reassured family that I would pass it along to her care team.

## 2020-04-13 LAB — CBC
HCT: 32.7 % — ABNORMAL LOW (ref 36.0–46.0)
Hemoglobin: 10.1 g/dL — ABNORMAL LOW (ref 12.0–15.0)
MCH: 29.7 pg (ref 26.0–34.0)
MCHC: 30.9 g/dL (ref 30.0–36.0)
MCV: 96.2 fL (ref 80.0–100.0)
Platelets: 252 10*3/uL (ref 150–400)
RBC: 3.4 MIL/uL — ABNORMAL LOW (ref 3.87–5.11)
RDW: 13.9 % (ref 11.5–15.5)
WBC: 15.1 10*3/uL — ABNORMAL HIGH (ref 4.0–10.5)
nRBC: 0 % (ref 0.0–0.2)

## 2020-04-13 LAB — CULTURE, RESPIRATORY W GRAM STAIN

## 2020-04-13 LAB — COMPREHENSIVE METABOLIC PANEL
ALT: 54 U/L — ABNORMAL HIGH (ref 0–44)
AST: 60 U/L — ABNORMAL HIGH (ref 15–41)
Albumin: 3.8 g/dL (ref 3.5–5.0)
Alkaline Phosphatase: 71 U/L (ref 38–126)
Anion gap: 9 (ref 5–15)
BUN: 24 mg/dL — ABNORMAL HIGH (ref 8–23)
CO2: 25 mmol/L (ref 22–32)
Calcium: 9.1 mg/dL (ref 8.9–10.3)
Chloride: 112 mmol/L — ABNORMAL HIGH (ref 98–111)
Creatinine, Ser: 0.45 mg/dL (ref 0.44–1.00)
GFR calc Af Amer: >60 mL/min (ref >60)
GFR calc non Af Amer: >60 mL/min (ref >60)
Glucose, Bld: 183 mg/dL — ABNORMAL HIGH (ref 70–99)
Potassium: 3.8 mmol/L (ref 3.5–5.1)
Sodium: 146 mmol/L — ABNORMAL HIGH (ref 135–145)
Total Bilirubin: 1.1 mg/dL (ref 0.3–1.2)
Total Protein: 5.4 g/dL — ABNORMAL LOW (ref 6.5–8.1)

## 2020-04-13 LAB — GLUCOSE, CAPILLARY
Glucose-Capillary: 148 mg/dL — ABNORMAL HIGH (ref 70–99)
Glucose-Capillary: 162 mg/dL — ABNORMAL HIGH (ref 70–99)
Glucose-Capillary: 122 mg/dL — ABNORMAL HIGH (ref 70–99)
Glucose-Capillary: 233 mg/dL — ABNORMAL HIGH (ref 70–99)
Glucose-Capillary: 161 mg/dL — ABNORMAL HIGH (ref 70–99)
Glucose-Capillary: 118 mg/dL — ABNORMAL HIGH (ref 70–99)

## 2020-04-13 MED ORDER — FREE WATER
100.0000 mL | Freq: Three times a day (TID) | Status: DC
  Administered 2020-04-13 – 2020-04-15 (×6): 100 mL

## 2020-04-13 MED ORDER — ACETAMINOPHEN 160 MG/5ML PO SOLN
500.0000 mg | Freq: Four times a day (QID) | ORAL | Status: DC | PRN
  Administered 2020-04-13: 500 mg
  Filled 2020-04-13: qty 20.3

## 2020-04-13 MED ORDER — OSMOLITE 1.2 CAL PO LIQD
1000.0000 mL | ORAL | Status: DC
  Administered 2020-04-13 – 2020-04-17 (×5): 1000 mL
  Filled 2020-04-13 (×9): qty 1000

## 2020-04-13 MED ORDER — CEFAZOLIN SODIUM-DEXTROSE 2-4 GM/100ML-% IV SOLN
2.0000 g | Freq: Three times a day (TID) | INTRAVENOUS | Status: DC
  Administered 2020-04-13 – 2020-04-18 (×15): 2 g via INTRAVENOUS
  Filled 2020-04-13 (×15): qty 100

## 2020-04-13 MED ORDER — PROSOURCE TF PO LIQD: 45.0000 mL | Freq: Every day | ORAL | Status: DC

## 2020-04-13 NOTE — Progress Notes (Signed)
Nutrition Follow-up  DOCUMENTATION CODES:   Not applicable  INTERVENTION:   Tube Feeding via Cortrak: Osmolite 1.2 at 60 ml/hr Provides 1728 kcals, 80 g of protein and 1166 mL of free water Meets 100% of estimated calorie and protein needs   NUTRITION DIAGNOSIS:   Inadequate oral intake related to inability to eat as evidenced by NPO status.  Being addressed via TF   GOAL:   Patient will meet greater than or equal to 90% of their needs  Met  MONITOR:   Vent status, TF tolerance, Labs, Weight trends, Skin  REASON FOR ASSESSMENT:   Ventilator, Consult Enteral/tube feeding initiation and management  ASSESSMENT:   79 year-old female former smoker with medical hx of HLD, HTN, GERD, osteopenia, psoriasis, rosacea, sclerosus, neuropathy, sleep apnea, arthritis, s/p pacemaker placement, CAD, and TMJ. She was transferred to [facility] from [place] on 6/26 with weakness, numbness, and tingling.  She was recently started on enbrel for psoriasis. Transferred to [facility] on 7/3 for further neurologic assessment.  Required intubation for airway protection with concern for seizure.  6/26 Admit to [facility] 6/28 Started IVIG 7/03 Transferred to [facility] ICU, possible seizures, Intubated 7/06 Code Blue called, pt unresponsive in MRI, Chest tube placed 7/07 Started PLEX for GBS 7/09 Cortrak placed  7/11 Extubated  Currently on HFNC Plan for PLEX 5/5 treatment on 7/14  NPO, Cortrak remains in place Vital AF 1.2 at 50 ml/hr, Pro-source TF 45 mL BID, free water flush of 100 mL q 8 hours  Current weight 73.5 kg; admit weight 76 kg. +1.7 per I/O flow sheet  Labs: sodium 146 (H), Creatinine wdl Meds: ss novolog,solumedrol   Diet Order:   Diet Order            Diet NPO time specified  Diet effective now                 EDUCATION NEEDS:   Not appropriate for education at this time  Skin:  Skin Assessment: Skin Integrity Issues: Skin Integrity  Issues:: Stage II Stage I: n/a Stage II: sacrum  Last BM:  7/12  Height:   Ht Readings from Last 1 Encounters:  03/27/20 5' 6"  (1.676 m)    Weight:   Wt Readings from Last 1 Encounters:  04/13/20 73.5 kg    BMI:  Body mass index is 26.15 kg/m.  Estimated Nutritional Needs:   Kcal:  1600-1800 kcals  Protein:  80-90 g  Fluid:  >/= 1.7 L/day   Kerman Passey MS, RDN, LDN, CNSC Registered Dietitian III Clinical Nutrition RD Pager and On-Call Pager Number Located in [employer]

## 2020-04-13 NOTE — Progress Notes (Signed)
Neurology Progress Note   S:// Seen and examined. No acute changes  O:// Current vital signs: BP (!) 126/102   Pulse 78   Temp 97.6 F (36.4 C) (Oral)   Resp 14   Wt 73.5 kg   SpO2 100%   BMI 26.15 kg/m  Vital signs in last 24 hours: Temp:  [97.6 F (36.4 C)-98.3 F (36.8 C)] 97.6 F (36.4 C) (07/13 0352) Pulse Rate:  [52-99] 78 (07/13 0800) Resp:  [14-24] 14 (07/13 0800) BP: (90-143)/(47-102) 126/102 (07/13 0800) SpO2:  [95 %-100 %] 100 % (07/13 0800) FiO2 (%):  [30 %] 30 % (07/13 0100) Weight:  [73.5 kg] 73.5 kg (07/13 0419) Gen: in bed, nad HEENT: [place] AT MMM CVS: RRR Abd: ND NT Resp: chest tenderness+ Neurological Exam MS: AAOx3 Speech and language: hypophonic, no dysarthria or aphasia CN: PERRL EOMI Face symmetric Motor: shoulder abduction 2/5 b/l, elbow flexion/ext 4/5 b/l. Hand strength 3/5 b/l. Both legs barely 4-/5 at hip flexors. Sensory: can appreciate LT in both LE - reports better than prior days   Medications  Current Facility-Administered Medications:  .  acetaminophen (TYLENOL) tablet 650 mg, 650 mg, Oral, Q6H PRN, Chesley Mires, MD, 650 mg at 04/10/20 1104 .  acetaminophen (TYLENOL) tablet 650 mg, 650 mg, Oral, Q4H PRN, Greta Doom, MD .  acetaminophen (TYLENOL) tablet 650 mg, 650 mg, Oral, Q4H PRN, Greta Doom, MD .  chlorhexidine (PERIDEX) 0.12 % solution 15 mL, 15 mL, Mouth Rinse, BID, Mannam, Praveen, MD, 15 mL at 04/12/20 2115 .  Chlorhexidine Gluconate Cloth 2 % PADS 6 each, 6 each, Topical, Daily, Audria Nine, DO, 6 each at 04/12/20 1228 .  citrate dextrose (ACD-A anticoagulant) solution 1,000 mL, 1,000 mL, Other, Continuous, Greta Doom, MD, 1,000 mL at 04/12/20 1504 .  citrate dextrose (ACD-A anticoagulant) solution 1,000 mL, 1,000 mL, Other, Continuous, Kirkpatrick, Vida Roller, MD .  dexmedetomidine (PRECEDEX) 400 MCG/100ML (4 mcg/mL) infusion, 0.4-1.2 mcg/kg/hr, Intravenous, Titrated, Collier Bullock,  MD, Last Rate: 7.81 mL/hr at 04/13/20 0600, 0.4 mcg/kg/hr at 04/13/20 0600 .  diphenhydrAMINE (BENADRYL) capsule 25 mg, 25 mg, Oral, Q6H PRN, Greta Doom, MD .  diphenhydrAMINE (BENADRYL) capsule 25 mg, 25 mg, Oral, Q6H PRN, Greta Doom, MD .  docusate (COLACE) 50 MG/5ML liquid 100 mg, 100 mg, Per Tube, BID, Audria Nine, DO, 100 mg at 04/11/20 2148 .  enoxaparin (LOVENOX) injection 40 mg, 40 mg, Subcutaneous, Daily, Halford Chessman, Vineet, MD, 40 mg at 04/12/20 1016 .  feeding supplement (PROSource TF) liquid 45 mL, 45 mL, Per Tube, BID, Agarwala, Ravi, MD, 45 mL at 04/12/20 2300 .  feeding supplement (VITAL AF 1.2 CAL) liquid 1,000 mL, 1,000 mL, Per Tube, Continuous, Kipp Brood, MD, Stopped at 04/12/20 1130 .  guaiFENesin (ROBITUSSIN) 100 MG/5ML solution 100 mg, 5 mL, Per Tube, Q4H PRN, Ogan, [place]or[place]wo U, MD, 100 mg at 04/12/20 0059 .  heparin sodium (porcine) injection 1,000-6,000 Units, 1,000-6,000 Units, Intravenous, PRN, Cristal Generous, NP, 1,000 Units at 04/07/20 1703 .  insulin aspart (novoLOG) injection 0-15 Units, 0-15 Units, Subcutaneous, Q4H, Chesley Mires, MD, 2 Units at 04/13/20 0419 .  ipratropium-albuterol (DUONEB) 0.5-2.5 (3) MG/3ML nebulizer solution 3 mL, 3 mL, Nebulization, Q6H PRN, Frederik Pear, MD, 3 mL at 04/12/20 0801 .  MEDLINE mouth rinse, 15 mL, Mouth Rinse, q12n4p, Mannam, Praveen, MD, 15 mL at 04/12/20 1658 .  methylPREDNISolone sodium succinate (SOLU-MEDROL) 40 mg/mL injection 40 mg, 40 mg, Intravenous, Q12H, Mannam, Praveen, MD, 40 mg  at 04/13/20 0008 .  pantoprazole sodium (PROTONIX) 40 mg/20 mL oral suspension 40 mg, 40 mg, Per Tube, Daily, Chesley Mires, MD, 40 mg at 04/12/20 1015 .  polyethylene glycol (MIRALAX / GLYCOLAX) packet 17 g, 17 g, Oral, Daily, Chesley Mires, MD, 17 g at 04/11/20 0959 .  sodium chloride flush (NS) 0.9 % injection 10 mL, 10 mL, Intracatheter, Q8H, Elie Confer, Sarah F, NP, 10 mL at 04/13/20 0011 .  sodium chloride flush  (NS) 0.9 % injection 10-40 mL, 10-40 mL, Intracatheter, Q12H, Agarwala, Ravi, MD, 10 mL at 04/09/20 1036 .  sodium chloride flush (NS) 0.9 % injection 10-40 mL, 10-40 mL, Intracatheter, PRN, Agarwala, Ravi, MD .  sodium chloride flush (NS) 0.9 % injection 10-40 mL, 10-40 mL, Intracatheter, Q12H, Agarwala, Ravi, MD, 10 mL at 04/12/20 2112 .  sodium chloride flush (NS) 0.9 % injection 10-40 mL, 10-40 mL, Intracatheter, PRN, Agarwala, Ravi, MD .  vancomycin (VANCOREADY) IVPB 1250 mg/250 mL, 1,250 mg, Intravenous, Q12H, Mannam, Praveen, MD, Stopped at 04/13/20 0141 Labs CBC    Component Value Date/Time   WBC 15.1 (H) 04/13/2020 0421   RBC 3.40 (L) 04/13/2020 0421   HGB 10.1 (L) 04/13/2020 0421   HCT 32.7 (L) 04/13/2020 0421   PLT 252 04/13/2020 0421   MCV 96.2 04/13/2020 0421   MCH 29.7 04/13/2020 0421   MCHC 30.9 04/13/2020 0421   RDW 13.9 04/13/2020 0421   LYMPHSABS 1.2 04/09/2020 0350   MONOABS 0.9 04/09/2020 0350   EOSABS 0.1 04/09/2020 0350   BASOSABS 0.0 04/09/2020 0350    CMP     Component Value Date/Time   NA 146 (H) 04/13/2020 0421   K 3.8 04/13/2020 0421   CL 112 (H) 04/13/2020 0421   CO2 25 04/13/2020 0421   GLUCOSE 183 (H) 04/13/2020 0421   BUN 24 (H) 04/13/2020 0421   CREATININE 0.45 04/13/2020 0421   CALCIUM 9.1 04/13/2020 0421   PROT 5.4 (L) 04/13/2020 0421   ALBUMIN 3.8 04/13/2020 0421   AST 60 (H) 04/13/2020 0421   ALT 54 (H) 04/13/2020 0421   ALKPHOS 71 04/13/2020 0421   BILITOT 1.1 04/13/2020 0421   GFRNONAA >60 04/13/2020 0421   GFRAA >60 04/13/2020 0421  Pertinent Labs: CSF WBC 0 CSF RBC 5 CSF protein 33 Csf Glucose 53 b12 323 Paraneoplastic panel - pending.  Heavy metals negaitve.   Imaging I have reviewed images in epic and the results pertinent to this consultation are: Brain C-sp, and T-sp imaging with age related changes and DJD. No cord compression.  Assessment: 79/F with acute neuropathic exam, in the setting of recent Embrel dose for  psoriasis. MRI brain c and t spine negative for acute process. CSF with no evidence of albuminocytological dissociation but did have severe hyponatremia with 4doses of IVIG, which was stopped at OSH. Had a seizure in setting of Na 119. Given the temporal association with Embrel, this is likely Guillian Barre synd -CSF negative.  Would have benefited from a EMG/NCS to get a confirmatory test - but not available at our center unfortunately. PLEX considered due to poor response to IVIG and also due to the fact that it might help clearing out Embrel. PLEX started with last round scheduled for tomorrow.  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 -Needs PT OT -Can be OOB in chair as tolerated. -Complained of chest  pain - palpable tenderness at the site of chest tubes. Likely related to the tube placement. Do not see a need for cardiac w/u at this time but if continues to complain, will defer to PCCM to evaluate further.  -I will follow after completion of PLEX tomorrow or day after.  -Discussed with Dr. Ruthann Cancer in the unit.   -- Amie Portland, MD [facility] Pager: [phone] If 7pm to 7am, please call on call as listed on AMION.  CRITICAL CARE ATTESTATION Performed by: Amie Portland, MD Total critical care time: 31 minutes Critical care time was exclusive of separately billable procedures and treating other patients and/or supervising APPs/Residents/Students Critical care was necessary to treat or prevent imminent or life-threatening deterioration due to Guillain Barre Syndrome  This patient is critically ill and at significant risk for neurological worsening and/or death and care requires constant monitoring. Critical care was time spent personally by me on the following activities: development of treatment plan with  patient and/or surrogate as well as nursing, discussions with consultants, evaluation of patient's response to treatment, examination of patient, obtaining history from patient or surrogate, ordering and performing treatments and interventions, ordering and review of laboratory studies, ordering and review of radiographic studies, pulse oximetry, re-evaluation of patient's condition, participation in multidisciplinary rounds and medical decision making of high complexity in the care of this patient.

## 2020-04-13 NOTE — Progress Notes (Addendum)
NAME:  Alison Huffman, MRN:  [id number], DOB:  [birth year], LOS: 38 ADMISSION DATE:  04/03/2020, CONSULTATION DATE:  04/03/2020 REFERRING MD:  Dr. Hal Hope, [facility], CHIEF COMPLAINT:  Respiratory failure   Brief History   79 yo female former smoker transferred to [facility] from [facility] on 6/26 with weakness, numbness, and tingling after being started on Enbrel for psoriasis. Patient developed severe hyponatremia, had a seizure and was transferred to [facility] on 7/03 for further neurologic assessment.  Required intubation for airway protection and transferred to ICU. 7/06 patient had code blue and sustained bilateral pneumothoraces after bag ventilation for which bilateral chest tubes were placed. Patient started on PLEX 7/7 per neuro recommendation. Patient extubated on 7/11 and weaning HFNC.    Past Medical History  CAD, s/p PM, GERD, HTN, HLD, Psoriasis, Arthritis, OSA  Significant Hospital Events   6/26 Admit to [facility] and seen by neurology 6/28 start IVIG  7/02 seen by nephrology at [facility] >> d/c IVIG due to concern about this causing dilutional hyponatremia 7/03 transfer from [facility]; possible seizure, started  keppra, intubated, transferred to ICU 7/6: unresponsive during MRI, Code Blue called, levophed restarted for 2.5 hours, limited MRI without significant disease findings 7/7: started PLEX treatment for GBS  7/9: Making progress, however still with weak cough, tolerating pressure support ventilation but unable to ventilate adequately with pressure support less than 10 cmH2O, core track tube placed  7/11 NIF -40 good F/VT mechanics less accessory  use but now w/ pulm edema Consults:  Nephrology [facility]) Neurology  Procedures:  ETT 7/03 >> extubated 7/11  Significant Diagnostic Tests:   CT angio head/neck 6/26 >> 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 Reports some chest tightness.   Objective   Blood pressure 118/62, pulse 78, temperature 98.2 F (36.8 C), temperature source Axillary, resp. rate 14, weight 73.5 kg, SpO2 100 %.    FiO2 (%):  [30 %] 30 %   Intake/Output Summary (Last 24 hours) at 04/13/2020 8099 Last data filed at 04/13/2020 0800 Gross per 24 hour  Intake 1289.24 ml  Output 1425 ml  Net -135.76 ml   Filed Weights   04/11/20 0456 04/12/20 0500 04/13/20 0419  Weight: 78.1 kg 73 kg 73.5 kg   Examination: General : elderly female sitting upright in bed with oxygen mask, in NAD  HEENT: MMM  Pulmonary : crackles at lung bases, no wheezing, improved rhonchi, on HFNC 15L; no skin changes in described area of chest tightness, tenderness to palpation along thorax  Cardiac: RRR without murmur Abdomen: soft, non tender with bowel sounds present throughout  Extremities: moves extremities Neuro : follows commands, left UE with more grip strength than right UE, sensation intact throughout, able to left LE against minimal resistance, verbally communicating today and oriented x4   Resolved Hospital Problem list   Requirement for vasopressors  Bilateral pneumothoraces status post intubation resolved Hyponatremia  Seizures secondary to hyponatremia  Assessment & Plan:  Acute hypoxic respiratory failure with compromised airway 2nd to seizure in setting of hyponatremia and progressive weakness Hx of OSA Staph Aureus pulmonary infiltrates with stridor  Staph aureus on sputum culture, susceptibilities pending. Vancomycin started  overnight. WBC count 15 from 14. Afebrile overnight.  - switch Vanc to ancef  For total duration of 7 days, to end 7/18   - solumedrol 22m BID IV for 24 additional hours for stridor  - Continue NIF checks daily  - VAP bundle  - discontinue Precedex  - PAD protocol RASS goal 0   Hypernatremia Na 146, free water deficit 0.6 liters.  - 100 mL free water q 8 hours  - follow BMP    Numbness/weakness progressing from lower extremities up with a working diagnosis of GBS, failed IVIG - PLEX 4/5 treatment today, next on 7/14  - neuro checks   Elevated Alk Phos, improved  Alk Pho 150>71, AST 48>60 and ALT 62>54, all down trending. RUQ ultrasound on 7/12 with gallbladder sludge, borderline hepatic steatosis, recommending repeat RUQ ultrasound in 1 year.   - recommend outpatient follow up   Intermittent fluid electrolyte imbalance - Trend BMP and replenish as necessary   Hyperglycemia   - continue moderate SSI  Chronic HFpEF, Hx of HTN, HLD. - Holding Crestor and Diovan - Continuing telemetry monitoring  Mild anemia w/out evidence of bleeding-->suspect some degree of either hemodilution or even some hemolysis w/ PLEX - Trend CBC  Hx of psoriasis. Hold Enbrel, some question as to whether this could have been a contributing factor  Urinary retention:  Keep Foley  Best practice:  Diet: tube feeds DVT prophylaxis: lovenox GI prophylaxis: protonix Mobility: bed rest Code Status: full code Disposition: ICU Family communication:discussed updates for treatment plan with patient at bedside   Labs   CBC: Recent Labs  Lab 04/08/20 0423 04/08/20 0423 04/09/20 0350 04/11/20 0454 04/11/20 2115 04/12/20 0552 04/13/20 0421  WBC 9.3  --  10.8* 10.4  --  14.7* 15.1*  NEUTROABS 7.6  --  8.5*  --   --   --   --   HGB 10.4*   < > 10.0* 7.6* 10.9* 10.5* 10.1*  HCT 33.1*   < > 30.9* 24.6* 32.0* 33.3* 32.7*  MCV 95.1  --  94.8 97.2  --  94.3 96.2  PLT 161  --  149* 191  --  279 252   <  > = values in this interval not displayed.    Basic Metabolic Panel: Recent Labs  Lab 04/07/20 2101 04/08/20 0423 04/11/20 0454 04/11/20 0454 04/11/20 1326 04/11/20 2115 04/12/20 0552 04/12/20 1340 04/13/20 0421  NA  --    < > 142   < > 140 145 146* 147* 146*  K  --    < > 3.3*   < > 3.4* 3.3* 3.6 3.9 3.8  CL  --    < > 111  --  105  --  106 107 112*  CO2  --    < > 24  --  25  --  _0 GLUCOSE  --    < > 144*  --  135*  --  128* 113* 183*  BUN  --    < > 14  --  13  --  14 17 24*  CREATININE  --    < > 0.31*  --  0.38*  --  0.48 0.46 0.45  CALCIUM  --    < > 8.9  --  9.0  --  9.1 9.1 9.1  MG 1.9  --  2.0  --  1.9  --   --   --   --    < > = values in this interval not displayed.   GFR: Estimated Creatinine Clearance: 57.6 mL/min (by C-G formula based on SCr of 0.45 mg/dL). Recent Labs  Lab 04/09/20 0350 04/11/20 0454 04/12/20 0552 04/13/20 0421  WBC 10.8* 10.4 14.7* 15.1*    Liver Function Tests: Recent Labs  Lab 04/11/20 0454 04/12/20 0552 04/13/20 0421  AST 53* 48* 60*  ALT 52* 62* 54*  ALKPHOS 153* 150* 71  BILITOT 0.9 1.0 1.1  PROT 5.5* 6.3* 5.4*  ALBUMIN 3.4* 3.6 3.8   CBG: Recent Labs  Lab 04/12/20 1514 04/12/20 2006 04/12/20 2323 04/13/20 0347 04/13/20 0829  GLUCAP 112* 130* 89 148* 162*   Critical care time 30 min  Eulis Foster, MD  [place]  PGY-2 04/13/20

## 2020-04-13 NOTE — Evaluation (Addendum)
Clinical/Bedside Swallow Evaluation Patient Details  Name: Alison Huffman MRN: [id number] Date of Birth: Oct 13, 1940  Today's Date: 04/13/2020 Time: SLP Start Time (ACUTE ONLY): 0907 SLP Stop Time (ACUTE ONLY): 0925 SLP Time Calculation (min) (ACUTE ONLY): 18 min  Past Medical History:  Past Medical History:  Diagnosis Date  . Ankle pain, right   . Atypical chest pain    a. 10/2018 MV: EF 57%. No ischemia/infarct.  . Coronary artery disease   . Diastolic dysfunction    a. 04/2018 Echo: EF 55-60%, no rwma, Gr1 DD. Triv AI, mild MR. Mod dil LA. Nl RV fxn. PASP nl.  . GERD (gastroesophageal reflux disease)   . Headache    "dull one sometimes daily, at least weekly in last couple months" (07/22/2018)  . Hyperlipidemia   . Hypertension   . 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 Joint pain in fingers of right hand   . Lichen sclerosus   . Osteopenia   . Pneumonia 2012? X 1  . 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 Psoriasis   . Psoriatic arthritis ([place])    " dx'd the 1st of this year, 2019" (07/22/2018)  . Psoriatic arthritis ([place])   . Rosacea   . Seborrheic keratosis   . Second degree heart block    a. 07/2018 s/p SJM Assurity MRI model PM2271 (Ser# [id number]).  . Sleep apnea    "using nasal pilllows since ~ 12/2017" (07/22/2018)  . TMJ (dislocation of temporomandibular joint)    Past Surgical History:  Past Surgical History:  Procedure Laterality Date  . APPENDECTOMY    . BREAST CYST ASPIRATION Bilateral   . BREAST CYST EXCISION Right 1978   benign  . COLONOSCOPY    . COLONOSCOPY WITH PROPOFOL N/A 07/10/2019   Procedure: COLONOSCOPY WITH PROPOFOL;  Surgeon: Lollie Sails, MD;  Location: [facility] ENDOSCOPY;  Service: Endoscopy;  Laterality: N/A;  . ESOPHAGOGASTRODUODENOSCOPY (EGD) WITH PROPOFOL N/A 07/10/2019   Procedure: ESOPHAGOGASTRODUODENOSCOPY (EGD) WITH PROPOFOL;  Surgeon: Lollie Sails, MD;  Location: [facility] ENDOSCOPY;  Service: Endoscopy;  Laterality: N/A;   . HYSTEROSCOPY WITH D & C N/A 11/05/2018   Procedure: DILATATION AND CURETTAGE /HYSTEROSCOPY;  Surgeon: Homero Fellers, MD;  Location: [facility] ORS;  Service: Gynecology;  Laterality: N/A;  . INSERT / REPLACE / REMOVE PACEMAKER    . 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 UTERINE POLYPS REMOVAL     HPI:  Pt is an 79 yo female with presumed [place]. She is a former smoker transferred to [facility] from [place] on 6/26 with weakness, numbness, and tingling after being started on Enbrel for psoriasis. Patient developed severe hyponatremia, had a seizure and was transferred to [facility] on 7/03 for further neurologic assessment.  Required intubation for airway protection on 7/3 and waws extubated on 7/11 but has needed biPAP intermittently for respiratory distress. Pt became unresponsive during MRI on 7/6 and had code blue; sustained bilateral pneumothoraces after bag ventilation for which bilateral chest tubes were placed. MRI was negative for acute changes. Patient started on PLEX plasmaphoresis 7/7. CXR 7/12: Bibasilar atelectasis/infiltrate with no significant interval change.Cortrak placed 7/9  Assessment / Plan / Recommendation Clinical Impression  Pt was seen for bedside swallow evaluation and she denied a history of dysphagia. Pt exhibited coughing and suctioning at baseline. Oral mechanism exam was [facility] and she presented with adequate, natural dentition. She  presented with symptoms of pharyngeal dysphagia characterized by coughing with ice chips and thin liquids via tsp, suggesting aspiration, and 2-4 secondary swallows after 1/4 tsp-boluses of puree and throat clearing, suggesting pharyngeal residue and possible aspiration of residue. It is recommended that the pt's NPO status be maintained. SLP will follow for dysphagia treatment.  SLP Visit Diagnosis: Dysphagia, pharyngeal phase  (R13.13)    Aspiration Risk  Moderate aspiration risk;Severe aspiration risk    Diet Recommendation NPO;Alternative means - temporary   Medication Administration: Via alternative means    Other  Recommendations Oral Care Recommendations: Oral care QID;Staff/trained caregiver to provide oral care   Follow up Recommendations Other (comment) (TBD)      Frequency and Duration min 2x/week  2 weeks       Prognosis Prognosis for Safe Diet Advancement: Good Barriers to Reach Goals: Severity of deficits      Swallow Study   General Date of Onset: 04/11/20 HPI: Pt is an 79 yo female former smoker transferred to [facility] from [place] [facility] [place] on 6/26 with weakness, numbness, and tingling after being started on Enbrel for psoriasis. Patient developed severe hyponatremia, had a seizure and was transferred to [facility] on 7/03 for further neurologic assessment.  Required intubation for airway protection on 7/3 and waws extubated on 7/11 but has needed biPAP intermittently for respiratory distress. Pt became unresponsive during MRI on 7/6 and had code blue; sustained bilateral pneumothoraces after bag ventilation for which bilateral chest tubes were placed. MRI was negative for acute changes. Patient started on PLEX 7/7 per neuro recommendation. CXR 7/12: Bibasilar atelectasis/infiltrate with no significant interval change.Cortrak placed 7/9 Type of Study: Bedside Swallow Evaluation Previous Swallow Assessment: None Diet Prior to this Study: NPO Temperature Spikes Noted: No Respiratory Status: Nasal cannula History of Recent Intubation: Yes Length of Intubations (days): 8 days Date extubated: 04/11/20 Behavior/Cognition: Alert;Cooperative;Pleasant mood Oral Cavity Assessment: Within Functional Limits Oral Care Completed by SLP: No Oral Cavity - Dentition: Adequate natural dentition Vision: Functional for self-feeding Self-Feeding Abilities: Able to feed self Patient  Positioning: Upright in bed;Postural control adequate for testing Baseline Vocal Quality: Normal Volitional Cough: Weak Volitional Swallow: Able to elicit    Oral/Motor/Sensory Function Overall Oral Motor/Sensory Function: Within functional limits   Ice Chips Ice chips: Impaired Presentation: Spoon Pharyngeal Phase Impairments: Cough - Immediate   Thin Liquid Thin Liquid: Impaired Pharyngeal  Phase Impairments: Cough - Immediate    Nectar Thick Nectar Thick Liquid: Not tested   Honey Thick Honey Thick Liquid: Not tested   Puree Puree: Impaired Pharyngeal Phase Impairments: Multiple swallows;Throat Clearing - Delayed   Solid     Solid: Not tested     Dewayne Jurek I. Hardin Negus, [place], [place] Office number [phone] Pager [phone]  Horton Marshall 04/13/2020,12:13 PM

## 2020-04-13 NOTE — Discharge Instructions (Signed)
  You had a right upper quadrant ultrasound of your abdomen that showed gallbladder sludge vs a polyp as well as borderline fatty liver. We recommend that you have a repeat right upper quadrant ultrasound in 1 year.

## 2020-04-14 DIAGNOSIS — L4050 Arthropathic psoriasis, unspecified: Secondary | ICD-10-CM

## 2020-04-14 DIAGNOSIS — R0602 Shortness of breath: Secondary | ICD-10-CM

## 2020-04-14 DIAGNOSIS — I2510 Atherosclerotic heart disease of native coronary artery without angina pectoris: Secondary | ICD-10-CM

## 2020-04-14 DIAGNOSIS — I5032 Chronic diastolic (congestive) heart failure: Secondary | ICD-10-CM

## 2020-04-14 DIAGNOSIS — I10 Essential (primary) hypertension: Secondary | ICD-10-CM

## 2020-04-14 LAB — COMPREHENSIVE METABOLIC PANEL
ALT: 58 U/L — ABNORMAL HIGH (ref 0–44)
AST: 37 U/L (ref 15–41)
Albumin: 3.5 g/dL (ref 3.5–5.0)
Alkaline Phosphatase: 72 U/L (ref 38–126)
Anion gap: 8 (ref 5–15)
BUN: 20 mg/dL (ref 8–23)
CO2: 27 mmol/L (ref 22–32)
Calcium: 8.8 mg/dL — ABNORMAL LOW (ref 8.9–10.3)
Chloride: 110 mmol/L (ref 98–111)
Creatinine, Ser: 0.4 mg/dL — ABNORMAL LOW (ref 0.44–1.00)
GFR calc Af Amer: >60 mL/min (ref >60)
GFR calc non Af Amer: >60 mL/min (ref >60)
Glucose, Bld: 212 mg/dL — ABNORMAL HIGH (ref 70–99)
Potassium: 4.1 mmol/L (ref 3.5–5.1)
Sodium: 145 mmol/L (ref 135–145)
Total Bilirubin: 0.6 mg/dL (ref 0.3–1.2)
Total Protein: 5.5 g/dL — ABNORMAL LOW (ref 6.5–8.1)

## 2020-04-14 LAB — GLUCOSE, CAPILLARY
Glucose-Capillary: 159 mg/dL — ABNORMAL HIGH (ref 70–99)
Glucose-Capillary: 155 mg/dL — ABNORMAL HIGH (ref 70–99)
Glucose-Capillary: 117 mg/dL — ABNORMAL HIGH (ref 70–99)
Glucose-Capillary: 123 mg/dL — ABNORMAL HIGH (ref 70–99)
Glucose-Capillary: 89 mg/dL (ref 70–99)

## 2020-04-14 LAB — CBC
HCT: 31.8 % — ABNORMAL LOW (ref 36.0–46.0)
Hemoglobin: 10 g/dL — ABNORMAL LOW (ref 12.0–15.0)
MCH: 29.9 pg (ref 26.0–34.0)
MCHC: 31.4 g/dL (ref 30.0–36.0)
MCV: 95.2 fL (ref 80.0–100.0)
Platelets: 308 10*3/uL (ref 150–400)
RBC: 3.34 MIL/uL — ABNORMAL LOW (ref 3.87–5.11)
RDW: 13.9 % (ref 11.5–15.5)
WBC: 14 10*3/uL — ABNORMAL HIGH (ref 4.0–10.5)
nRBC: 0 % (ref 0.0–0.2)

## 2020-04-14 MED ORDER — CALCIUM GLUCONATE-NACL 2-0.675 GM/100ML-% IV SOLN
2.0000 g | Freq: Once | INTRAVENOUS | Status: AC
  Administered 2020-04-14: 2000 mg via INTRAVENOUS
  Filled 2020-04-14: qty 100

## 2020-04-14 MED ORDER — CALCIUM GLUCONATE 10 % IV SOLN
2.0000 g | Freq: Once | INTRAVENOUS | Status: DC
  Filled 2020-04-14: qty 20

## 2020-04-14 MED ORDER — SODIUM CHLORIDE 0.9 % IV SOLN
INTRAVENOUS | Status: AC
  Filled 2020-04-14 (×3): qty 200

## 2020-04-14 MED ORDER — ACD FORMULA A 0.73-2.45-2.2 GM/100ML VI SOLN
1000.0000 mL | Status: DC
  Administered 2020-04-14: 1000 mL
  Filled 2020-04-14 (×2): qty 1000

## 2020-04-14 MED ORDER — HEPARIN SODIUM (PORCINE) 1000 UNIT/ML IJ SOLN: 1000.0000 [IU] | Freq: Once | INTRAMUSCULAR | Status: DC

## 2020-04-14 MED ORDER — ALBUMIN HUMAN 25 % IV SOLN
Freq: Once | INTRAVENOUS | Status: DC
Start: 2020-04-14 — End: 2020-04-14

## 2020-04-14 MED ORDER — ACETAMINOPHEN 325 MG PO TABS: 650.0000 mg | ORAL_TABLET | ORAL | Status: DC | PRN

## 2020-04-14 MED ORDER — DIPHENHYDRAMINE HCL 25 MG PO CAPS: 25.0000 mg | ORAL_CAPSULE | Freq: Four times a day (QID) | ORAL | Status: DC | PRN

## 2020-04-14 NOTE — Consult Note (Signed)
Physical Medicine and Rehabilitation Consult Reason for Consult: Progressive numbness and weakness of both lower and upper extremities Referring Physician: Triad   HPI: Alison Huffman is a 79 y.o. right-handed female with history of hypertension, psoriatic arthritis recently started on Enbrel, CAD with pacemaker, diastolic congestive heart failure, hyperlipidemia.  History taken from chart review and patient. Patient resides at [facility] independent living facility.  She has a sister in the area, however, she cares for her husband.  She presents to [facility] on 03/27/20 with weakness, numbness, tingling of b/l LE. By report she initially did see her PCP. She was diagnosed with neuropathy and discharged home but noted progressive upper lower extremity weakness and paresthesia.  Within minutes of her admission the patient became unresponsive, ?seizures and rapid response was called.  EEG negative for seizure.  Echocardiogram with EF of 60% with grade 1 diastolic dysfunction.  No wall motion abnormalities.  Lumbar puncture showed normal WBC and spinal fluid of 33.  CT angiogram of head and neck no acute abnormalities.  CT the unremarkable for acute intracranial process. No significant carotid or vertebral artery stenosis of the neck.  No intracranial stenosis or large vessel occlusion.admission labs with sodium of 130, troponin negative, SARS coronavirus negative.  Neurology follow-up suspected GBS and patient was transferred to [facility].  Patient did complete course of IVIG with poor response.  She was started on PLEX, completing total of 5 rounds.  She did require intubation through 04/11/2020.  Currently maintained on Lovenox for DVT prophylaxis.  Hospital course further complicated by dysphagia and made NPO with alternative means of nutritional support.  She is completing a course of Ancef for pneumonia.  Therapy evaluations completed with recommendations of physical medicine rehab  consult.  Review of Systems  Constitutional: Positive for malaise/fatigue. Negative for chills and fever.  HENT: Negative for hearing loss.   Eyes: Negative for blurred vision and double vision.  Respiratory: Negative for cough and shortness of breath.   Cardiovascular: Positive for leg swelling. Negative for chest pain and palpitations.  Gastrointestinal: Negative for heartburn, nausea and vomiting.       GERD  Genitourinary: Negative for dysuria and flank pain.  Musculoskeletal: Positive for joint pain and myalgias.  Skin: Negative for rash.  Neurological: Positive for tingling, sensory change, speech change, weakness and headaches.  All other systems reviewed and are negative.  Past Medical History:  Diagnosis Date  . Ankle pain, right   . Atypical chest pain    a. 10/2018 MV: EF 57%. No ischemia/infarct.  . Coronary artery disease   . Diastolic dysfunction    a. 04/2018 Echo: EF 55-60%, no rwma, Gr1 DD. Triv AI, mild MR. Mod dil LA. Nl RV fxn. PASP nl.  . GERD (gastroesophageal reflux disease)   . Headache    "dull one sometimes daily, at least weekly in last couple months" (07/22/2018)  . Hyperlipidemia   . Hypertension   . 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 Joint pain in fingers of right hand   . Lichen sclerosus   . Osteopenia   . Pneumonia 2012? X 1  . 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 Psoriasis   . Psoriatic arthritis ([place])    " dx'd the 1st of this year, 2019" (07/22/2018)  . Psoriatic arthritis ([place])   . Rosacea   . Seborrheic keratosis   . Second degree heart block    a. 07/2018 s/p SJM Assurity MRI model  [id number] (Ser# [id number]).  . Sleep apnea    "using nasal pilllows since ~ 12/2017" (07/22/2018)  . TMJ (dislocation of temporomandibular joint)    Past Surgical History:  Procedure Laterality Date  . APPENDECTOMY    . BREAST CYST ASPIRATION Bilateral   . BREAST CYST EXCISION Right 1978   benign  . COLONOSCOPY    . COLONOSCOPY WITH  PROPOFOL N/A 07/10/2019   Procedure: COLONOSCOPY WITH PROPOFOL;  Surgeon: Lollie Sails, MD;  Location: [facility] ENDOSCOPY;  Service: Endoscopy;  Laterality: N/A;  . ESOPHAGOGASTRODUODENOSCOPY (EGD) WITH PROPOFOL N/A 07/10/2019   Procedure: ESOPHAGOGASTRODUODENOSCOPY (EGD) WITH PROPOFOL;  Surgeon: Lollie Sails, MD;  Location: [facility] ENDOSCOPY;  Service: Endoscopy;  Laterality: N/A;  . HYSTEROSCOPY WITH D & C N/A 11/05/2018   Procedure: DILATATION AND CURETTAGE /HYSTEROSCOPY;  Surgeon: Homero Fellers, MD;  Location: [facility] ORS;  Service: Gynecology;  Laterality: N/A;  . INSERT / REPLACE / REMOVE PACEMAKER    . 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 UTERINE POLYPS REMOVAL     Family History  Problem Relation Age of Onset  . Stroke Mother   . Atrial fibrillation Mother   . Breast cancer Mother   . Stroke Father   . Breast cancer Maternal Aunt   . Breast cancer Paternal Aunt    Social History:  reports that she has quit smoking. Her smoking use included cigarettes. She has never used smokeless tobacco. She reports current alcohol use. She reports that she does not use drugs. Allergies:  Allergies  Allergen Reactions  . Nickel    Medications Prior to Admission  Medication Sig Dispense Refill  . acetaminophen (TYLENOL) 325 MG tablet Take 325-650 mg by mouth every 6 (six) hours as needed for mild pain or headache.     . bisacodyl (DULCOLAX) 10 MG suppository Place 1 suppository (10 mg total) rectally daily as needed for moderate constipation. 12 suppository 0  . Clobetasol Prop Emollient Base (CLOBETASOL PROPIONATE E) 0.05 % emollient cream Apply 1 application topically every Sunday.     [name] desonide (DESOWEN) 0.05 % ointment Apply 1 application topically 2 (two) times daily as needed (for psoriasis).     [name] etanercept (ENBREL) 50 MG/ML injection Inject 50 mg into the skin once a week. LF on 02-19-20 Ds 28    .  famotidine (PEPCID) 20 MG tablet Take 20 mg by mouth 2 (two) times daily as needed for heartburn or indigestion.     . Methylcellulose, Laxative, (CITRUCEL PO) Take 1 Dose by mouth daily as needed (constipation).     . metroNIDAZOLE (METROCREAM) 0.75 % cream Apply topically 2 (two) times daily as needed (facial redness/irritation.).     [name] nystatin cream (MYCOSTATIN) Apply 1 application topically 2 (two) times daily as needed for dry skin.     . polyethylene glycol (MIRALAX / GLYCOLAX) 17 g packet Take 17 g by mouth daily as needed for severe constipation. 14 each 0  . rosuvastatin (CRESTOR) 5 MG tablet Take 1 tablet (5 mg total) by mouth daily. 90 tablet 3  . tacrolimus (PROTOPIC) 0.1 % ointment Apply topically 2 (two) times daily as needed (for psoriasis).     . valsartan (DIOVAN) 80 MG tablet TAKE 1 TABLET DAILY (Patient taking differently: Take 80 mg by mouth daily. ) 90 tablet 3  . enoxaparin (LOVENOX) 40 MG/0.4ML injection Inject 0.4 mLs (40 mg total) into the skin daily.  0 mL   . ondansetron (ZOFRAN) 4 MG tablet Take 1 tablet (4 mg total) by mouth every 6 (six) hours as needed for nausea. 20 tablet 0    Home: Home Living Family/patient expects to be discharged to:: Private residence Living Arrangements: Alone Available Help at Discharge: Family, Friend(s) Type of Home: Apartment (Independent living apartment ) Home Access:  (to be determined) Home Layout: One level Additional Comments: Nedd more info re: home entry and bathroom setup  Functional History: Prior Function Level of Independence: Independent Comments: Still independent with IADL; no prior falls history; walked ~1 miile for activity with no AD Functional Status:  Mobility: Bed Mobility Overal bed mobility: Needs Assistance Bed Mobility: Rolling, Supine to Sit Rolling: Max assist Supine to sit: Max assist, +2 for safety/equipment General bed mobility comments: Cues for technqiue mod assist and use of pad to initiate  scooting hips to EOB; Max assist to support and elevate trunk to sit Transfers Overall transfer level: Needs assistance Equipment used: 2 person hand held assist Transfers: Sit to/from Stand, Squat Pivot Transfers Sit to Stand: Max assist, Total assist, From elevated surface Squat pivot transfers: Max assist, +2 physical assistance General transfer comment: Significant weakness, requiring knees blocked for stability; unable to get to fully upright standing; Performed basic squat pivot transfer bed to recliner with armrest down Ambulation/Gait General Gait Details: unsafe/unable at this time    ADL:    Cognition: Cognition Overall Cognitive Status: Within Functional Limits for tasks assessed Orientation Level: Oriented X4 Cognition Arousal/Alertness: Awake/alert Behavior During Therapy: WFL for tasks assessed/performed Overall Cognitive Status: Within Functional Limits for tasks assessed General Comments: Pt is A and O x 4  Blood pressure (!) 120/56, pulse 97, temperature 98.1 F (36.7 C), temperature source Oral, resp. rate 18, weight 73.5 kg, SpO2 98 %. Physical Exam Vitals reviewed.  Constitutional:      General: She is not in acute distress.    Appearance: She is not ill-appearing.  HENT:     Head: Normocephalic and atraumatic.     Comments: +NG    Right Ear: External ear normal.     Left Ear: External ear normal.     Nose: Nose normal.  Eyes:     General:        Right eye: No discharge.        Left eye: No discharge.     Extraocular Movements: Extraocular movements intact.  Pulmonary:     Effort: Pulmonary effort is normal. No respiratory distress.     Breath sounds: No stridor.  Abdominal:     General: Abdomen is flat. There is no distension.     Palpations: Abdomen is soft.  Musculoskeletal:     Cervical back: Normal range of motion and neck supple.     Comments: LE edema No tenderness in extremities  Skin:    General: Skin is warm and dry.  Neurological:       Mental Status: She is alert.     Comments: Alert Makes good eye contact with examiner.   Follows simple commands.   Reflexes absent in LE Sensation diminished to light touch b/l LE Motor: B/l UE: shoulder abduction 2+/5, elbow flex 4/5, elbow ext 2+/5, hand grip 4/5 B/l LE: HF, NE 2+/5, ADF 4-/5  Psychiatric:        Mood and Affect: Mood normal.        Behavior: Behavior normal.     Results for orders placed or performed during the hospital encounter  of 04/03/20 (from the past 24 hour(s))  Glucose, capillary     Status: Abnormal   Collection Time: 04/13/20  5:04 PM  Result Value Ref Range   Glucose-Capillary 233 (H) 70 - 99 mg/dL  Glucose, capillary     Status: Abnormal   Collection Time: 04/13/20  7:42 PM  Result Value Ref Range   Glucose-Capillary 161 (H) 70 - 99 mg/dL  Glucose, capillary     Status: Abnormal   Collection Time: 04/13/20 11:22 PM  Result Value Ref Range   Glucose-Capillary 118 (H) 70 - 99 mg/dL  Glucose, capillary     Status: Abnormal   Collection Time: 04/14/20  3:30 AM  Result Value Ref Range   Glucose-Capillary 159 (H) 70 - 99 mg/dL  CBC     Status: Abnormal   Collection Time: 04/14/20  5:18 AM  Result Value Ref Range   WBC 14.0 (H) 4.0 - 10.5 K/uL   RBC 3.34 (L) 3.87 - 5.11 MIL/uL   Hemoglobin 10.0 (L) 12.0 - 15.0 g/dL   HCT 31.8 (L) 36 - 46 %   MCV 95.2 80.0 - 100.0 fL   MCH 29.9 26.0 - 34.0 pg   MCHC 31.4 30.0 - 36.0 g/dL   RDW 13.9 11.5 - 15.5 %   Platelets 308 150 - 400 K/uL   nRBC 0.0 0.0 - 0.2 %  Comprehensive metabolic panel     Status: Abnormal   Collection Time: 04/14/20  5:18 AM  Result Value Ref Range   Sodium 145 135 - 145 mmol/L   Potassium 4.1 3.5 - 5.1 mmol/L   Chloride 110 98 - 111 mmol/L   CO2 27 22 - 32 mmol/L   Glucose, Bld 212 (H) 70 - 99 mg/dL   BUN 20 8 - 23 mg/dL   Creatinine, Ser 0.40 (L) 0.44 - 1.00 mg/dL   Calcium 8.8 (L) 8.9 - 10.3 mg/dL   Total Protein 5.5 (L) 6.5 - 8.1 g/dL   Albumin 3.5 3.5 - 5.0 g/dL    AST 37 15 - 41 U/L   ALT 58 (H) 0 - 44 U/L   Alkaline Phosphatase 72 38 - 126 U/L   Total Bilirubin 0.6 0.3 - 1.2 mg/dL   GFR calc non Af Amer >60 >60 mL/min   GFR calc Af Amer >60 >60 mL/min   Anion gap 8 5 - 15  Glucose, capillary     Status: Abnormal   Collection Time: 04/14/20  8:02 AM  Result Value Ref Range   Glucose-Capillary 155 (H) 70 - 99 mg/dL  Glucose, capillary     Status: Abnormal   Collection Time: 04/14/20 11:49 AM  Result Value Ref Range   Glucose-Capillary 117 (H) 70 - 99 mg/dL   US Abdomen Limited RUQ  Result Date: 04/12/2020 CLINICAL DATA:  Elevated alkaline phosphatase level. EXAM: ULTRASOUND ABDOMEN LIMITED RIGHT UPPER QUADRANT COMPARISON:  None. FINDINGS: Gallbladder: Physiologically distended. Mild layering sludge. Rounded echogenic structure without posterior shadowing measuring 9 mm in the gallbladder is likely a sludge ball. Possibility of polyp is difficult to exclude. No internal vascularity. No shadowing gallstones. No gallbladder wall thickening. No pericholecystic fluid. No sonographic Murphy sign noted by sonographer. Common bile duct: Diameter: 2-3 mm, normal. Liver: No focal lesion identified. Mildly increased in parenchymal echogenicity. Portal vein is patent on color Doppler imaging with normal direction of blood flow towards the liver. Other: Right pleural effusion. IMPRESSION: 1. Borderline hepatic steatosis. 2. Gallbladder sludge. Probable 10 mm sludge ball, however gallbladder  polyp is difficult to exclude. Recommend follow-up as a gallbladder polyp, ultrasound in 1 year. 3. No gallstones or sonographic evidence of acute gallbladder inflammation. No biliary dilatation. Electronically Signed   By: Keith Rake M.D.   On: 04/12/2020 22:26    Assessment/Plan: Diagnosis: GBS Labs reviewed.  Records reviewed and summated above.  1. Does the need for close, 24 hr/day medical supervision in concert with the patient's rehab needs make it unreasonable for  this patient to be served in a less intensive setting? Yes 2. Co-Morbidities requiring supervision/potential complications: HTN (monitor and provide prns in accordance with increased physical exertion and pain), psoriatic arthritisl, CAD with pacemaker, diastolic congestive heart failure (monitor for sign/symptoms), hyperlipidemia.  3. Due to bladder management, bowel management, safety, skin/wound care, disease management, pain management and patient education, does the patient require 24 hr/day rehab nursing? Yes 4. Does the patient require coordinated care of a physician, rehab nurse, therapy disciplines of PT/OT/SLP to address physical and functional deficits in the context of the above medical diagnosis(es)? Yes Addressing deficits in the following areas: balance, endurance, locomotion, strength, transferring, bowel/bladder control, bathing, dressing, feeding, grooming, toileting, cognition, speech, swallowing and psychosocial support 5. Can the patient actively participate in an intensive therapy program of at least 3 hrs of therapy per day at least 5 days per week? Potentially 6. The potential for patient to make measurable gains while on inpatient rehab is excellent 7. Anticipated functional outcomes upon discharge from inpatient rehab are Min/mod assist  with PT, Min/mod assist with OT, min assist with SLP. 8. Estimated rehab length of stay to reach the above functional goals is: 28-32 days. 9. Anticipated discharge destination: Other 10. Overall Rehab/Functional Prognosis: good and fair  RECOMMENDATIONS: This patient's condition is appropriate for continued rehabilitative care in the following setting: CIR to decrease the burden of care when medically stable and able to tolerate 3 hours of therapy/day. Patient has agreed to participate in recommended program. Yes Note that insurance prior authorization may be required for reimbursement for recommended care.  Comment: Rehab Admissions  Coordinator to follow up.  I have personally performed a face to face diagnostic evaluation, including, but not limited to relevant history and physical exam findings, of this patient and developed relevant assessment and plan.  Additionally, I have reviewed and concur with the physician assistant's documentation above.   Delice Lesch, MD, ABPMR Lavon Paganini Angiulli, PA-C 04/14/2020

## 2020-04-14 NOTE — Progress Notes (Signed)
  Speech Language Pathology Treatment: Dysphagia  Patient Details Name: Alison Huffman MRN: [id number] DOB: 1940/10/17 Today's Date: 04/14/2020 Time: 1000-1010 SLP Time Calculation (min) (ACUTE ONLY): 10 min  Assessment / Plan / Recommendation Clinical Impression  Pt up in chair, soft breathy but audible phonation. Cough achieves oral expectoration of thick mucous. Pt needs support for head/necka nd she reports subjective weakness. Trials of ice result in multiple weak appearing swallows, immediate cough response with 4/5 trials. Attempted to guide pt in effortful swallows, with some subjective success. Pt will need instrumental assessment of swallowing; will plan for MBS tomorrow given that last PLEX treatment will happen today.   HPI HPI: Pt is an 79 yo female with presumed [place]. She is a former smoker transferred to [facility] from [place] on 6/26 with weakness, numbness, and tingling after being started on Enbrel for psoriasis. Patient developed severe hyponatremia, had a seizure and was transferred to [facility] on 7/03 for further neurologic assessment.  Required intubation for airway protection on 7/3 and waws extubated on 7/11 but has needed biPAP intermittently for respiratory distress. Pt became unresponsive during MRI on 7/6 and had code blue; sustained bilateral pneumothoraces after bag ventilation for which bilateral chest tubes were placed. MRI was negative for acute changes. Patient started on PLEX plasmaphoresis 7/7. CXR 7/12: Bibasilar atelectasis/infiltrate with no significant interval change.Cortrak placed 7/9      SLP Plan  MBS       Recommendations  Diet recommendations: NPO                Plan: MBS       GO               Herbie Baltimore, MA CCC-SLP  Acute Rehabilitation Services Pager [phone] Office [phone]  Alison Huffman 04/14/2020, 12:08 PM

## 2020-04-14 NOTE — Progress Notes (Signed)
Rehab Admissions Coordinator Note:  Per PT recommendation, this patient was screened by Raechel Ache for appropriateness for an Inpatient Acute Rehab Consult.  At this time, we are recommending Inpatient Rehab consult. AC will contact MD to request order.   Raechel Ache 04/14/2020, 12:46 PM  I can be reached at [phone].

## 2020-04-14 NOTE — Evaluation (Signed)
Physical Therapy Evaluation Patient Details Name: Alison Huffman MRN: [id number] DOB: Mar 25, 1941 Today's Date: 04/14/2020   History of Present Illness  Pt is an 79 yo female former smoker transferred to [facility] from [facility] [place] on 6/26 with weakness, numbness, and tingling after being started on Enbrel for psoriasis. 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  Required intubation for airway protection on 7/3 and waws extubated on 7/11 but has needed biPAP intermittently for respiratory distress. Pt became unresponsive during MRI on 7/6 and had code blue; sustained bilateral pneumothoraces after bag ventilation for which bilateral chest tubes were placed. MRI was negative for acute changes. Patient started on PLEX 7/7 per neuro recommendation. CXR 7/12: Bibasilar atelectasis/infiltrate with no significant interval change.Cortrak placed 7/9   Clinical Impression   Pt admitted with above diagnosis. Comes from home where she lives in her independent living apartment; Independent in ADLs, IADLs, walking wihtout assistive device, driving prior to onset of this episode of care; Presents to PT with profound weakness (in particular hips, trunk), Dyscoordination and ataxia x4 extremities, decr functional capacity with significant supplemental O2 requirement -- all of which resutl in quite a functional decline;  Pt currently with functional limitations due to the deficits listed below (see PT Problem List). Pt will benefit from skilled PT to increase their independence and safety with mobility to allow discharge to the venue listed below.       Follow Up Recommendations CIR    Equipment Recommendations  Other (comment);Wheelchair (measurements PT);Wheelchair cushion (measurements PT);Rolling walker with 5" wheels;3in1 (PT);Hospital  bed (to be discerned based on progress)    Recommendations for Other Services Rehab consult;OT consult (OT consult ordered per protocol)     Precautions / Restrictions Precautions Precautions: Fall Precaution Comments: gross limb ataxia x4; significant weakness      Mobility  Bed Mobility Overal bed mobility: Needs Assistance Bed Mobility: Rolling;Supine to Sit Rolling: Max assist   Supine to sit: Max assist;+2 for safety/equipment     General bed mobility comments: Cues for technqiue mod assist and use of pad to initiate scooting hips to EOB; Max assist to support and elevate trunk to sit  Transfers Overall transfer level: Needs assistance Equipment used: 2 person hand held assist Transfers: Sit to/from [employer] Sit to Stand: Max assist;Total assist;From elevated surface   Squat pivot transfers: Max assist;+2 physical assistance     General transfer comment: Significant weakness, requiring knees blocked for stability; unable to get to fully upright standing; Performed basic squat pivot transfer bed to recliner with armrest down  Ambulation/Gait             General Gait Details: unsafe/unable at this time  Stairs            Wheelchair Mobility    Modified Rankin (Stroke Patients Only)       Balance Overall balance assessment: Needs assistance Sitting-balance support: Bilateral upper extremity supported;Feet supported Sitting balance-Leahy Scale: Poor (aprpoaching fair)       Standing balance-Leahy Scale: Zero                               Pertinent Vitals/Pain Pain Assessment: No/denies pain    Home Living Family/patient expects to be discharged to:: Private residence Living Arrangements: Alone Available Help at Discharge: Family;Friend(s)  Type of Home: Apartment (Independent living apartment ) Home Access:  (to be determined)     Home Layout: One level   Additional Comments: Nedd more info re: home entry  and bathroom setup    Prior Function Level of Independence: Independent         Comments: Still independent with IADL; no prior falls history; walked ~1 miile for activity with no AD     Hand Dominance        Extremity/Trunk Assessment   Upper Extremity Assessment Upper Extremity Assessment: Defer to OT evaluation;Generalized weakness (gross ataxia)    Lower Extremity Assessment Lower Extremity Assessment: Generalized weakness (in particular, hips); bil quad and ankle dorsiflexion strength 3/5       Communication   Communication: Expressive difficulties (Low volume)  Cognition Arousal/Alertness: Awake/alert Behavior During Therapy: WFL for tasks assessed/performed Overall Cognitive Status: Within Functional Limits for tasks assessed                                 General Comments: Pt is A and O x 4      General Comments General comments (skin integrity, edema, etc.): Session conducted on supplemental O2, 10 L via HFNC; HR incr to 124 with minimal activity; O2 sats stayed at or above 90%; occasional cough, not wet quality to cough    Exercises     Assessment/Plan    PT Assessment Patient needs continued PT services  PT Problem List Decreased strength;Decreased range of motion;Decreased activity tolerance;Decreased balance;Decreased mobility;Decreased knowledge of precautions;Decreased safety awareness;Decreased knowledge of use of DME       PT Treatment Interventions DME instruction;Gait training;Functional mobility training;Therapeutic activities;Therapeutic exercise;Balance training;Neuromuscular re-education;Cognitive remediation;Patient/family education;Wheelchair mobility training    PT Goals (Current goals can be found in the Care Plan section)  Acute Rehab PT Goals Patient Stated Goal: regain independent mobility PT Goal Formulation: With patient Time For Goal Achievement: 04/28/20 Potential to Achieve Goals: Fair    Frequency Min  3X/week   Barriers to discharge        Co-evaluation               AM-PAC PT "6 Clicks" Mobility  Outcome Measure Help needed turning from your back to your side while in a flat bed without using bedrails?: A Lot Help needed moving from lying on your back to sitting on the side of a flat bed without using bedrails?: Total Help needed moving to and from a bed to a chair (including a wheelchair)?: Total Help needed standing up from a chair using your arms (e.g., wheelchair or bedside chair)?: Total Help needed to walk in hospital room?: Total Help needed climbing 3-5 steps with a railing? : Total 6 Click Score: 7    End of Session Equipment Utilized During Treatment: Gait belt;Oxygen Activity Tolerance: Patient tolerated treatment well;Patient limited by fatigue Patient left: in chair;with call bell/phone within reach;with chair alarm set Nurse Communication: Mobility status;Need for lift equipment [facility] for back to bed -- ask maintenance to sheck on Maxisky; is the battery charging?) PT Visit Diagnosis: Unsteadiness on feet (R26.81);Other abnormalities of gait and mobility (R26.89);Difficulty in walking, not elsewhere classified (R26.2);Muscle weakness (generalized) (M62.81)    Time: 6378-5885 PT Time Calculation (min) (ACUTE ONLY): 26 min   Charges:   PT Evaluation $PT Eval Moderate Complexity: 1 Mod PT Treatments $Therapeutic Activity: 8-22 mins        Roney Marion, PT  Acute  Rehabilitation Services Pager [phone] Office [phone]   Colletta [place] 04/14/2020, 10:33 AM

## 2020-04-14 NOTE — Progress Notes (Signed)
NAME:  Alison Huffman, MRN:  [id number], DOB:  Sep 05, 1941, LOS: 75 ADMISSION DATE:  04/03/2020, CONSULTATION DATE:  04/03/2020 REFERRING MD:  Dr. Hal Hope, [facility], CHIEF COMPLAINT:  Respiratory failure   Brief History   79 yo female former smoker transferred to [facility] from [facility] of [place] on 6/26 with weakness, numbness, and tingling after being started on Enbrel for psoriasis. Patient developed severe hyponatremia, had a seizure and was transferred to [facility] on 7/03 for further neurologic assessment.  Required intubation for airway protection and transferred to ICU. 7/06 patient had code blue and sustained bilateral pneumothoraces after bag ventilation for which bilateral chest tubes were placed. Patient started on PLEX 7/7 per neuro recommendation. Patient extubated on 7/11 and weaning HFNC.    Past Medical History  CAD, s/p PM, GERD, HTN, HLD, Psoriasis, Arthritis, OSA  Significant Hospital Events   6/26 Admit to [facility] and seen by neurology 6/28 start IVIG  7/02 seen by nephrology at [facility] >> d/c IVIG due to concern about this causing dilutional hyponatremia 7/03 transfer from [facility]; possible seizure, started  keppra, intubated, transferred to ICU 7/6: unresponsive during MRI, Code Blue called, levophed restarted for 2.5 hours, limited MRI without significant disease findings 7/7: started PLEX treatment for GBS  7/9: Making progress, however still with weak cough, tolerating pressure support ventilation but unable to ventilate adequately with pressure support less than 10 cmH2O, core track tube placed  7/11 NIF -40 good F/VT mechanics less accessory use but now w/ pulm edema Consults:  [facility]) Neurology  Procedures:  ETT 7/03 >> extubated 7/11  Significant Diagnostic Tests:   CT angio head/neck 6/26 >> 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 Reports some chest tightness.   Objective   Blood pressure (!) 162/68, pulse 98, temperature 98.1 F (36.7 C), temperature source Oral, resp. rate 19, weight 73.5 kg, SpO2 100 %.        Intake/Output Summary (Last 24 hours) at 04/14/2020 1041 Last data filed at 04/14/2020 0900 Gross per 24 hour  Intake 1547.63 ml  Output 1000 ml  Net 547.63 ml   Filed Weights   04/11/20 0456 04/12/20 0500 04/13/20 0419  Weight: 78.1 kg 73 kg 73.5 kg   Examination: General : elderly female sitting upright in bed with oxygen mask, in NAD  HEENT: MMM, CorTrak in place  Pulmonary : fine crackles at bases, improved from prior exam, on HFNC 10L with appropriate saturations, aeration improved  Cardiac: RRR without murmur or gallops Abdomen: BS present, no tenderness to palpation  Extremities: no LE edema  Neuro : alert and oriented, improved grip strength and upper extremities movement against minimal resistance, sensation remains intact in upper and lower extremities, LE weakness remains    Resolved Hospital Problem list   Requirement for vasopressors  Bilateral pneumothoraces status post intubation resolved Hyponatremia  Seizures secondary to hyponatremia  Assessment & Plan:   Acute hypoxic respiratory failure with compromised airway 2nd to seizure  in setting of hyponatremia and progressive weakness Hx of OSA Staph Aureus pulmonary infiltrates with stridor  WBC improved to 14. Afebrile overnight.  - continue ancef to end 7/18   - Continue NIF checks daily  - PAD protocol RASS goal 0    Hypernatremia, mildly improved  Na 146>145 - continue 100 mL free water q 8 hours  - follow daily BMP    Numbness/weakness progressing from lower extremities up with a working diagnosis of GBS, failed IVIG - PLEX 5/5 treatment today, final treatment  - neurology recs: outpatient follow up and physical therapy  Elevated Alk Phos, resolved Alk Pho, AST and ALT all down trending. RUQ ultrasound on 7/12 with gallbladder sludge, borderline hepatic steatosis, recommending repeat RUQ ultrasound in 1 year.   - recommend outpatient follow up    Hyperglycemia   CBG ranged 120-233, on steroids for stridor, continue to monitor as will expect to decrease.   - continue moderate SSI  Chronic HFpEF, Hx of HTN, HLD. - Holding Crestor and Diovan - Continuing telemetry monitoring  Mild anemia w/out evidence of bleeding-->suspect some degree of either hemodilution or even some hemolysis w/ PLEX - Trend CBC  Hx of psoriasis. Hold Enbrel, some question as to whether this could have been a contributing factor  Urinary retention:  Keep Foley  Best practice:  Diet: tube feeds DVT prophylaxis: lovenox GI prophylaxis: protonix Mobility: bed rest Code Status: full code Disposition: ICU, would like to transfer from ICU on 7/15 as long as patient tolerates PLEX treatment today  Family communication: discussed updates for treatment plan with patient at bedside   Labs   CBC: Recent Labs  Lab 04/08/20 0423 04/08/20 0423 04/09/20 0350 04/09/20 0350 04/11/20 0454 04/11/20 2115 04/12/20 0552 04/13/20 0421 04/14/20 0518  WBC 9.3   < > 10.8*  --  10.4  --  14.7* 15.1* 14.0*  NEUTROABS 7.6  --  8.5*  --   --   --   --   --   --   HGB 10.4*   < > 10.0*   < > 7.6* 10.9* 10.5* 10.1* 10.0*  HCT 33.1*   < > 30.9*   < > 24.6* 32.0* 33.3* 32.7* 31.8*  MCV 95.1   < > 94.8  --  97.2  --  94.3 96.2 95.2  PLT 161   < > 149*  --  191  --  279 252 308   < > = values in this interval not displayed.     Basic Metabolic Panel: Recent Labs  Lab 04/07/20 2101 04/08/20 0423 04/11/20 0454 04/11/20 0454 04/11/20 1326 04/11/20 1326 04/11/20 2115 04/12/20 0552 04/12/20 1340 04/13/20 0421 04/14/20 0518  NA  --    < > 142   < > 140   < > 145 146* 147* 146* 145  K  --    < > 3.3*   < > 3.4*   < > 3.3* 3.6 3.9 3.8 4.1  CL  --    < > 111   < > 105  --   --  106 107 112* 110  CO2  --    < > 24   < > 25  --   --  30 27 25 27   GLUCOSE  --    < > 144*   < > 135*  --   --  128* 113* 183* 212*  BUN  --    < > 14   < > 13  --   --  14 17 24* 20  CREATININE  --    < > 0.31*   < > 0.38*  --   --  0.48 0.46 0.45 0.40*  CALCIUM  --    < > 8.9   < > 9.0  --   --  9.1 9.1 9.1 8.8*  MG 1.9  --  2.0  --  1.9  --   --   --   --   --   --    < > = values in this interval not displayed.   GFR: Estimated Creatinine Clearance: 57.6 mL/min (A) (by C-G formula based on SCr of 0.4 mg/dL (L)). Recent Labs  Lab 04/11/20 0454 04/12/20 0552 04/13/20 0421 04/14/20 0518  WBC 10.4 14.7* 15.1* 14.0*    Liver Function Tests: Recent Labs  Lab 04/11/20 0454 04/12/20 0552 04/13/20 0421 04/14/20 0518  AST 53* 48* 60* 37  ALT 52* 62* 54* 58*  ALKPHOS 153* 150* 71 72  BILITOT 0.9 1.0 1.1 0.6  PROT 5.5* 6.3* 5.4* 5.5*  ALBUMIN 3.4* 3.6 3.8 3.5   CBG: Recent Labs  Lab 04/13/20 1704 04/13/20 1942 04/13/20 2322 04/14/20 0330 04/14/20 0802  GLUCAP 233* 161* 118* 159* 155*   Critical care time 30 min  Eulis Foster, MD  [place]  PGY-2 04/14/20

## 2020-04-14 NOTE — Progress Notes (Addendum)
NEUROLOGY PROGRESS NOTE  Subjective: No significant complaints other than her throat feeling slightly raw and some shaking.  Exam: Vitals:   04/14/20 1000 04/14/20 1100  BP: (!) 146/79 (!) 145/63  Pulse: 99 97  Resp: 16 18  Temp:    SpO2: 100% 99%    Physical Exam  Constitutional: Appears well-developed and well-nourished.  Eyes: No scleral injection HENT: No OP obstrucion Head: Normocephalic.  Cardiovascular: Palpable Respiratory: Effort normal, non-labored breathing GI: Soft.  No distension. There is no tenderness.  Skin: WDI   Neuro:  Mental Status: Alert, oriented, thought content appropriate.  Speech fluent without evidence of aphasia but tremulous.  Able to follow 3 step commands without difficulty. Cranial Nerves: II:  Visual fields grossly normal,  III,IV, VI: ptosis not present, extra-ocular motions intact bilaterally pupils equal, round, reactive to light and accommodation V,VII: smile symmetric, facial light touch sensation normal bilaterally VIII: hearing normal bilaterally Motor: Bilateral dorsiflexion and plantar flexion 5/5, bilateral knee flexion, knee extension, hip flexion 2/5.  Bilateral biceps 4/5, bilateral tricep extension 3/5, bilateral shoulder abduction 2/5. Sensory: Pinprick and light touch intact throughout, bilaterally Deep Tendon Reflexes: No reflexes at the ankle, no reflexes with knee jerk, 1+ reflexes brachial radialis    Medications:  Scheduled: . chlorhexidine  15 mL Mouth Rinse BID  . Chlorhexidine Gluconate Cloth  6 each Topical Daily  . docusate  100 mg Per Tube BID  . enoxaparin  40 mg Subcutaneous Daily  . free water  100 mL Per Tube Q8H  . insulin aspart  0-15 Units Subcutaneous Q4H  . mouth rinse  15 mL Mouth Rinse q12n4p  . pantoprazole sodium  40 mg Per Tube Daily  . polyethylene glycol  17 g Oral Daily  . sodium chloride flush  10-40 mL Intracatheter Q12H  . sodium chloride flush  10-40 mL Intracatheter Q12H    Continuous: .  ceFAZolin (ANCEF) IV 2 g (04/14/20 1146)  . feeding supplement (OSMOLITE 1.2 CAL) 1,000 mL (04/14/20 0959)   MWU:[id number] (TYLENOL) oral liquid 160 mg/5 mL, guaiFENesin, heparin sodium (porcine), ipratropium-albuterol, sodium chloride flush, sodium chloride flush  Pertinent Labs/Diagnostics: Brain, C-spine, T-spine imaging with age-related changes and DJD however no cord compression  US Abdomen Limited RUQ  Result Date: 04/12/2020 CLINICAL DATA:  Elevated alkaline phosphatase level. EXAM: ULTRASOUND ABDOMEN LIMITED RIGHT UPPER QUADRANT COMPARISON:  None. FINDINGS: Gallbladder: Physiologically distended. Mild layering sludge. Rounded echogenic structure without posterior shadowing measuring 9 mm in the gallbladder is likely a sludge ball. Possibility of polyp is difficult to exclude. No internal vascularity. No shadowing gallstones. No gallbladder wall thickening. No pericholecystic fluid. No sonographic Murphy sign noted by sonographer. Common bile duct: Diameter: 2-3 mm, normal. Liver: No focal lesion identified. Mildly increased in parenchymal echogenicity. Portal vein is patent on color Doppler imaging with normal direction of blood flow towards the liver. Other: Right pleural effusion. IMPRESSION: 1. Borderline hepatic steatosis. 2. Gallbladder sludge. Probable 10 mm sludge ball, however gallbladder polyp is difficult to exclude. Recommend follow-up as a gallbladder polyp, ultrasound in 1 year. 3. No gallstones or sonographic evidence of acute gallbladder inflammation. No biliary dilatation. Electronically Signed   By: Keith Rake M.D.   On: 04/12/2020 22:26     Etta Quill PA-C [facility] [phone]  Assessment:  79 year old female with acute neuropathic exam, in the setting of recent Enbrel dose for psoriasis.  MRI brain, C-spine, T-spine negative for acute process.  CSF with no evidence of albumin no cytological dissociation but  did have severe  hyponatremia with 4 doses of IVIG, which was stopped at OSH. Patient had a seizure in the setting of sodium 119 Given the temporal association with embryo, this is likely Guillain-Barr syndrome-CSF negative. Would have benefited from a EMG/nerve conduction study to get confirmatory test-but not available at our center unfortunately. Patient had poor response to IVIG and started on plasma exchange.  Today will be her fifth and last dose.   Impression: -Guillain-Barr syndrome-poor response to IVIG, today will be last day of plasma exchange-symptoms atypical, CSF not supportive of Guillain-Barr but continues to have proximal more than distal weakness.  Presence of hyponatremia goes in favor of GBS -Provoked seizure in the setting of hyponatremia-not on antiepileptic drug secondary to resolved  Recommendations: -Receiving last dose of plasma exchange today -We will need PT OT -Out of bed in chair as tolerated -Speech to make decision on removing NG tube -Outpatient neurology follow-up with neuromuscular specialist either in town or at an academic institution around town  04/14/2020, 12:09 PM  Attending Neurohospitalist Addendum Patient seen and examined with APP/Resident. Agree with the history and physical as documented above. Agree with the plan as documented, which I helped formulate. I have independently reviewed the chart, obtained history, review of systems and examined the patient.I have personally reviewed pertinent head/neck/spine imaging (CT/MRI). Please feel free to call with any questions. --- Amie Portland, MD [facility] Pager: [phone]  If 7pm to 7am, please call on call as listed on AMION.

## 2020-04-15 ENCOUNTER — Inpatient Hospital Stay (HOSPITAL_COMMUNITY): Payer: Medicare Other

## 2020-04-15 LAB — GLUCOSE, CAPILLARY
Glucose-Capillary: 100 mg/dL — ABNORMAL HIGH (ref 70–99)
Glucose-Capillary: 123 mg/dL — ABNORMAL HIGH (ref 70–99)
Glucose-Capillary: 123 mg/dL — ABNORMAL HIGH (ref 70–99)
Glucose-Capillary: 126 mg/dL — ABNORMAL HIGH (ref 70–99)
Glucose-Capillary: 139 mg/dL — ABNORMAL HIGH (ref 70–99)
Glucose-Capillary: 76 mg/dL (ref 70–99)
Glucose-Capillary: 78 mg/dL (ref 70–99)

## 2020-04-15 LAB — BASIC METABOLIC PANEL
Anion gap: 8 (ref 5–15)
BUN: 16 mg/dL (ref 8–23)
CO2: 26 mmol/L (ref 22–32)
Calcium: 8.7 mg/dL — ABNORMAL LOW (ref 8.9–10.3)
Chloride: 110 mmol/L (ref 98–111)
Creatinine, Ser: 0.33 mg/dL — ABNORMAL LOW (ref 0.44–1.00)
GFR calc Af Amer: >60 mL/min (ref >60)
GFR calc non Af Amer: >60 mL/min (ref >60)
Glucose, Bld: 108 mg/dL — ABNORMAL HIGH (ref 70–99)
Potassium: 3.8 mmol/L (ref 3.5–5.1)
Sodium: 144 mmol/L (ref 135–145)

## 2020-04-15 LAB — CBC
HCT: 33.6 % — ABNORMAL LOW (ref 36.0–46.0)
Hemoglobin: 10.5 g/dL — ABNORMAL LOW (ref 12.0–15.0)
MCH: 30.2 pg (ref 26.0–34.0)
MCHC: 31.3 g/dL (ref 30.0–36.0)
MCV: 96.6 fL (ref 80.0–100.0)
Platelets: 343 10*3/uL (ref 150–400)
RBC: 3.48 MIL/uL — ABNORMAL LOW (ref 3.87–5.11)
RDW: 14 % (ref 11.5–15.5)
WBC: 13.9 10*3/uL — ABNORMAL HIGH (ref 4.0–10.5)
nRBC: 0 % (ref 0.0–0.2)

## 2020-04-15 MED ORDER — FREE WATER
30.0000 mL | Status: DC
  Administered 2020-04-15 – 2020-04-18 (×17): 30 mL

## 2020-04-15 NOTE — Progress Notes (Signed)
Occupational Therapy Evaluation Patient Details Name: Alison Huffman MRN: [id number] DOB: [birth year] Today's Date: 04/15/2020    History of Present Illness 79 yo transferred from  [facility] on 6/24 due to progressive onset paresthesias in hadns/feet, eventually with weakness now progressed to total body including head and trunk. Pt had seizure in seeting of hyponatremia. 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 PMH: psoriatic arthritis, CAD, PPM, HTN.    Clinical Impression   PTA, pt lived alone in [facility] in [place], was independent with ADL, IADL tasks and mobility and enjoyed exercising and playing games. Able to progress to EOB with Max A +2 and maintain static sitting balance EOB with occasional Min A due to weakness and decreased postural control. +2 Max A +2 to stand however unable to achieve full upright posture. Pt currently requires Max A with UB ADL and total A with LB ADL due to deficits listed below. Pt will benefit from extensive post acute rehab at [facility]. Pt very motivated to get stronger and do more for herself. Will follow acutely to maximize functional level of independence and facilitate DC to next venue of care.   VSS during session. Pt initially on 8L HFNC; left on 2L with SpO2 97; desat to 91 on RA. Encouraged use of incentive spirometer - able to pull 315ml.   Follow Up Recommendations  CIR;Supervision/Assistance - 24 hour    Equipment Recommendations  3 in 1 bedside commode    Recommendations for Other Services Rehab consult     Precautions / Restrictions Precautions Precautions: Fall Required Braces or Orthoses:  (B prevalon boots) Restrictions Weight Bearing Restrictions: No      Mobility Bed Mobility Overal bed mobility: Needs Assistance   Rolling: Min assist Sidelying to sit: Max assist;+2 for physical assistance   Sit to supine: Max assist;+2 for physical assistance   General bed mobility comments: cues  for flexing knee adn assist to reach for rail due to proximal weakness; Once holding rail, pt able to pull self over to side; sidelying to sit works well; pt ablet o assist with sliding legs off bed adn pushing up through elbow  Transfers Overall transfer level: Needs assistance Equipment used: 2 person hand held assist Transfers: Sit to/from Stand Sit to Stand: Max assist;+2 physical assistance         General transfer comment: +2 A with use of bed pad adn gait belt however unable to achieve full upright posture; Poor proprioception R LE requiring B knees be blocked    Balance Overall balance assessment: Needs assistance Sitting-balance support: Feet supported;Bilateral upper extremity supported Sitting balance-Leahy Scale: Poor       Standing balance-Leahy Scale: Zero                             ADL either performed or assessed with clinical judgement   ADL Overall ADL's : Needs assistance/impaired Eating/Feeding: NPO   Grooming: Moderate assistance Grooming Details (indicate cue type and reason): able to complete oral care with toothette; difficulty reaching top of head adn comleting cross body for deoderant Upper Body Bathing: Sitting;Moderate assistance   Lower Body Bathing: Maximal assistance;Bed level   Upper Body Dressing : Maximal assistance   Lower Body Dressing: Total assistance   Toilet Transfer: +2 for physical assistance;Maximal assistance Toilet Transfer Details (indicate cue type and reason): simulated sit - stand; will need drop arm; use of DLE Toileting- Clothing Manipulation and Hygiene: Total  assistance Toileting - Clothing Manipulation Details (indicate cue type and reason): incontinenet per nursing     Functional mobility during ADLs: Maximal assistance;+2 for physical assistance       Vision Patient Visual Report: No change from baseline Additional Comments: wears 1 contact; will further assess     Perception     Praxis  Praxis Praxis tested?: Within functional limits    Pertinent Vitals/Pain Pain Assessment: No/denies pain     Hand Dominance Right   Extremity/Trunk Assessment Upper Extremity Assessment Upper Extremity Assessment: RUE deficits/detail;LUE deficits/detail RUE Deficits / Details: RUE appears slightly weaker than L; shoulder elevation/depression 3+/5; retraction/protraction 3+/5; unablet o lift off from bed due to proximal weakness - FF adn abd 2/5; ER 3/5; elbow flex 3+/5; elbow ext 3/5; sup/pron 3+/5; wrist/hand 3+/5; able to oppose thumb to all digits but reduce fine motor control; jerking movement present; ablet o manipulate nurse call bell adn yonker but difficulty controlling movement due to ataxic movements and proximal weakness RUE Sensation: decreased proprioception;decreased light touch RUE Coordination: decreased fine motor;decreased gross motor LUE Deficits / Details: similar to R but appears overall weaker   Lower Extremity Assessment Lower Extremity Assessment: Defer to PT evaluation   Cervical / Trunk Assessment Cervical / Trunk Assessment: Other exceptions;Kyphotic Cervical / Trunk Exceptions: forward head; posterior bias   Communication Communication Communication: HOH   Cognition Arousal/Alertness: Awake/alert Behavior During Therapy: Flat affect;Anxious (anxious at times) Overall Cognitive Status: No family/caregiver present to determine baseline cognitive functioning                                 General Comments: Will further assess cognition; slow processing; A & O x 4   General Comments       Exercises Exercises: General Upper Extremity General Exercises - Upper Extremity Shoulder Flexion: AAROM;Both;10 reps;Supine Shoulder ABduction: AROM;Both;10 reps;Supine Elbow Flexion: AAROM;Both;10 reps;Seated Elbow Extension: AAROM;Both;10 reps;Seated Digit Composite Flexion: AROM;Both;10 reps Composite Extension: AROM;Both;10 reps;Seated Other  Exercises Other Exercises: incentive spirometer x 10 - ablet o pull 300 ml   Shoulder Instructions      Home Living Family/patient expects to be discharged to:: Private residence Living Arrangements: Alone Available Help at Discharge: Family;Friend(s) Type of Home: Apartment       Home Layout: One level     Bathroom Shower/Tub: Occupational psychologist: Handicapped height Bathroom Accessibility: Yes How Accessible: Accessible via walker Home Equipment: None   Additional Comments: Independent Living Apt      Prior Functioning/Environment Level of Independence: Independent        Comments: Still independent with IADL; no prior falls history; walked ~1 miile for activity with no AD        OT Problem List: Decreased strength      OT Treatment/Interventions: Self-care/ADL training;Therapeutic exercise;Neuromuscular education;Energy conservation;DME and/or AE instruction;Therapeutic activities;Patient/family education;Balance training    OT Goals(Current goals can be found in the care plan section) Acute Rehab OT Goals Patient Stated Goal: regain independent mobility OT Goal Formulation: With patient Time For Goal Achievement: 04/29/20 Potential to Achieve Goals: Good  OT Frequency: Min 2X/week   Barriers to D/C:            Co-evaluation PT/OT/SLP Co-Evaluation/Treatment: Yes Reason for Co-Treatment: Complexity of the patient's impairments (multi-system involvement);For patient/therapist safety;To address functional/ADL transfers   OT goals addressed during session: ADL's and self-care;Strengthening/ROM      AM-PAC OT "6 Clicks" Daily Activity  Outcome Measure Help from another person eating meals?: Total Help from another person taking care of personal grooming?: A Lot Help from another person toileting, which includes using toliet, bedpan, or urinal?: Total Help from another person bathing (including washing, rinsing, drying)?: A Lot Help from  another person to put on and taking off regular upper body clothing?: A Lot Help from another person to put on and taking off regular lower body clothing?: Total 6 Click Score: 9   End of Session Equipment Utilized During Treatment: Gait belt;Oxygen (2L) Nurse Communication: Mobility status;Other (comment);Need for lift equipment (encourage chair position)  Activity Tolerance: Patient tolerated treatment well Patient left: in bed;with call bell/phone within reach;with bed alarm set (semi chair position)  OT Visit Diagnosis: Unsteadiness on feet (R26.81);Other abnormalities of gait and mobility (R26.89);Muscle weakness (generalized) (M62.81);Ataxia, unspecified (R27.0)                Time: 0915-1000 OT Time Calculation (min): 45 min Charges:  OT General Charges $OT Visit: 1 Visit OT Evaluation $OT Eval High Complexity: 1 High OT Treatments $Self Care/Home Management : 8-22 mins  Maurie Boettcher, OT/L   Acute OT Clinical Specialist Acute Rehabilitation Services Pager [phone] Office [phone]   [facility] 04/15/2020, 10:53 AM

## 2020-04-15 NOTE — PMR Pre-admission (Signed)
PMR Admission Coordinator Pre-Admission Assessment   Patient: Alison Huffman is an 79 y.o., female MRN: [id number] DOB: 08/08/1941 Height:   Weight: 78.4 kg                                                                                                                                                  Insurance Information HMO:     PPO:      PCP:      IPA:      80/20: yes     OTHER:  PRIMARY: Medicare A and B      Policy#: [id number]      Subscriber: patient CM Name:       Phone#:      Fax#:  Pre-Cert#:       Employer:  Benefits:  Phone #: online     Name: verified eligibility online via [employer] on 04/16/20 Eff. Date: part A and B effective 11/02/2004     Deduct: $1,484      Out of Pocket Max: NA      Life Max: NA  CIR: Covered per Medicare guidelines once yearly deductible has been met      SNF: days 1-20, 100%, days 21-100, 80% Outpatient: 80%     Co-Pay: 20% Home Health: 100%       DME: 80%     Co-Pay: 20% Providers: Pt's choice SECONDARY: Tricare      Policy#: [id number]      Phone#: [phone]   Financial Counselor:       Phone#:    The "Data Collection Information Summary" for patients in Inpatient Rehabilitation Facilities with attached "Privacy Act Statement-Health Care Records" was provided and verbally reviewed with: Patient   Emergency Contact Information         Contact Information     Name Relation Home Work Mobile    Fields, Wendy Niece [phone]   [phone]    Spicer, Kimberly Niece     [phone]    Edwards,Barbara Sister [phone]   [phone]       Current Medical History  Patient Admitting Diagnosis: GBS   History of Present Illness: Alison Huffman is a 79 y.o. right-handed female with history of hypertension, psoriatic arthritis recently started on Enbrel, CAD with pacemaker, diastolic congestive heart failure, hyperlipidemia.  History taken from chart review and patient. Patient resides at [facility] independent living facility.  She  has a sister in the area, however, she cares for her husband.  She presents to [facility] on 03/27/20 with weakness, numbness, tingling of b/l LE. By report she initially did see her PCP. She was diagnosed with neuropathy and discharged home but noted progressive upper lower extremity weakness and paresthesia.  Within minutes of her admission the patient became unresponsive, ?seizures and rapid response was called.  EEG negative for seizure.  Echocardiogram with   EF of 60% with grade 1 diastolic dysfunction.  No wall motion abnormalities.  Lumbar puncture showed normal WBC and spinal fluid of 33.  CT angiogram of head and neck no acute abnormalities.  CT the unremarkable for acute intracranial process. No significant carotid or vertebral artery stenosis of the neck.  No intracranial stenosis or large vessel occlusion.admission labs with sodium of 130, troponin negative, SARS coronavirus negative.  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  Patient did complete course of IVIG with poor response.  She was started on PLEX, completing total of 5 rounds.  She did require intubation through 04/11/2020.  Currently maintained on Lovenox for DVT prophylaxis.  Hospital course further complicated by dysphagia and made NPO with alternative means of nutritional support.  She is completing a course of Ancef for pneumonia.  Therapy evaluations completed with recommendations for CIR. Pt is to admit to CIR on 04/18/20.    Glasgow Coma Scale Score: 15   Past Medical History      Past Medical History:  Diagnosis Date  . Ankle pain, right    . Atypical chest pain      a. 10/2018 MV: EF 57%. No ischemia/infarct.  . Coronary artery disease    . Diastolic dysfunction      a. 04/2018 Echo: EF 55-60%, no rwma, Gr1 DD. Triv AI, mild MR. Mod dil LA. Nl RV fxn. PASP nl.  . GERD (gastroesophageal reflux disease)    . Headache      "dull one sometimes daily, at least weekly in last couple months"  (07/22/2018)  . Hyperlipidemia    . Hypertension    . Inflammatory neuropathy (HCC)    . Joint pain in fingers of right hand    . Lichen sclerosus    . Osteopenia    . Pneumonia 2012? X 1  . 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 Psoriasis    . Psoriatic arthritis (HCC)      " dx'd the 1st of this year, 2019" (07/22/2018)  . Psoriatic arthritis (HCC)    . Rosacea    . Seborrheic keratosis    . Second degree heart block      a. 07/2018 s/p SJM Assurity MRI model PM2271 (Ser# [id number]).  . Sleep apnea      "using nasal pilllows since ~ 12/2017" (07/22/2018)  . TMJ (dislocation of temporomandibular joint)        Family History  family history includes Atrial fibrillation in her mother; Breast cancer in her maternal aunt, mother, and paternal aunt; Stroke in her father and mother.   Prior Rehab/Hospitalizations:  Has the patient had prior rehab or hospitalizations prior to admission? Yes   Has the patient had major surgery during 100 days prior to admission? Yes   Current Medications    Current Facility-Administered Medications:  .  acetaminophen (TYLENOL) 160 MG/5ML solution 500 mg, 500 mg, Per Tube, Q6H PRN, Marshall, Jessica, DO, 500 mg at 04/13/20 1623 .  acetaminophen (TYLENOL) tablet 650 mg, 650 mg, Oral, Q4H PRN, Kirkpatrick, McNeill P, MD .  ceFAZolin (ANCEF) IVPB 2g/100 mL premix, 2 g, Intravenous, Q8H, Marshall, Jessica, DO, Last Rate: 200 mL/hr at 04/17/20 1313, 2 g at 04/17/20 1313 .  chlorhexidine (PERIDEX) 0.12 % solution 15 mL, 15 mL, Mouth Rinse, BID, Mannam, Praveen, MD, 15 mL at 04/17/20 0915 .  Chlorhexidine Gluconate Cloth 2 % PADS 6 each, 6 each, Topical, Daily, Marshall, Jessica, DO, 6 each at 04/17/20   0917 .  citrate dextrose (ACD-A anticoagulant) solution 1,000 mL, 1,000 mL, Other, Continuous, Kirkpatrick, McNeill P, MD, 1,000 mL at 04/14/20 1820 .  diphenhydrAMINE (BENADRYL) capsule 25 mg, 25 mg, Oral, Q6H PRN, Kirkpatrick, McNeill P, MD .   enoxaparin (LOVENOX) injection 40 mg, 40 mg, Subcutaneous, Daily, Sood, Vineet, MD, 40 mg at 04/17/20 0915 .  feeding supplement (OSMOLITE 1.2 CAL) liquid 1,000 mL, 1,000 mL, Per Tube, Continuous, Mannam, Praveen, MD, Last Rate: 60 mL/hr at 04/17/20 0020, 1,000 mL at 04/17/20 0020 .  free water 30 mL, 30 mL, Per Tube, Q4H, Marshall, Jessica, DO, 30 mL at 04/17/20 1316 .  guaiFENesin (ROBITUSSIN) 100 MG/5ML solution 100 mg, 5 mL, Per Tube, Q4H PRN, Ogan, Okoronkwo U, MD, 100 mg at 04/16/20 0915 .  heparin sodium (porcine) injection 1,000 Units, 1,000 Units, Intracatheter, Once, Kirkpatrick, McNeill P, MD .  heparin sodium (porcine) injection 1,000-6,000 Units, 1,000-6,000 Units, Intravenous, PRN, Bowser, Grace E, NP, 1,000 Units at 04/07/20 1703 .  insulin aspart (novoLOG) injection 0-15 Units, 0-15 Units, Subcutaneous, Q4H, Sood, Vineet, MD, 2 Units at 04/17/20 0916 .  ipratropium-albuterol (DUONEB) 0.5-2.5 (3) MG/3ML nebulizer solution 3 mL, 3 mL, Nebulization, Q6H PRN, Ogan, Okoronkwo U, MD, 3 mL at 04/13/20 1522 .  MEDLINE mouth rinse, 15 mL, Mouth Rinse, q12n4p, Mannam, Praveen, MD, 15 mL at 04/17/20 1316 .  pantoprazole sodium (PROTONIX) 40 mg/20 mL oral suspension 40 mg, 40 mg, Per Tube, Daily, Sood, Vineet, MD, 40 mg at 04/17/20 0916 .  polyethylene glycol (MIRALAX / GLYCOLAX) packet 17 g, 17 g, Oral, Daily PRN, Vann, Jessica U, DO .  sodium chloride flush (NS) 0.9 % injection 10-40 mL, 10-40 mL, Intracatheter, Q12H, Agarwala, Ravi, MD, 10 mL at 04/17/20 0917 .  sodium chloride flush (NS) 0.9 % injection 10-40 mL, 10-40 mL, Intracatheter, PRN, Agarwala, Ravi, MD .  sodium chloride flush (NS) 0.9 % injection 10-40 mL, 10-40 mL, Intracatheter, Q12H, Agarwala, Ravi, MD, 10 mL at 04/17/20 0917 .  sodium chloride flush (NS) 0.9 % injection 10-40 mL, 10-40 mL, Intracatheter, PRN, Agarwala, Ravi, MD   Patients Current Diet:     Diet Order                      Diet NPO time specified Except for:  Ice Chips, Other (See Comments)  Diet effective now                      Precautions / Restrictions Precautions Precautions: Fall Precaution Comments: gross limb ataxia x4; significant weakness Restrictions Weight Bearing Restrictions: No    Has the patient had 2 or more falls or a fall with injury in the past year?No   Prior Activity Level Community (5-7x/wk): Independent PTA; ran her own errands, drove, no AD use PTA. would get out of the hose 3-4x/week; lived at ILF   Prior Functional Level Prior Function Level of Independence: Independent Comments: Still independent with IADL; no prior falls history; walked ~1 miile for activity with no AD   Self Care: Did the patient need help bathing, dressing, using the toilet or eating?  Independent   Indoor Mobility: Did the patient need assistance with walking from room to room (with or without device)? Independent   Stairs: Did the patient need assistance with internal or external stairs (with or without device)? Independent   Functional Cognition: Did the patient need help planning regular tasks such as shopping or remembering to take medications? Independent     Home Assistive Devices / Equipment Home Equipment: None   Prior Device Use: Indicate devices/aids used by the patient prior to current illness, exacerbation or injury? None of the above   Current Functional Level Cognition   Overall Cognitive Status: No family/caregiver present to determine baseline cognitive functioning Orientation Level: Oriented X4 General Comments: decreased processing and awareness of limb placement    Extremity Assessment (includes Sensation/Coordination)   Upper Extremity Assessment: RUE deficits/detail, LUE deficits/detail RUE Deficits / Details: RUE appears slightly weaker than L; shoulder elevation/depression 3+/5; retraction/protraction 3+/5; unablet o lift off from bed due to proximal weakness - FF adn abd 2/5; ER 3/5; elbow flex 3+/5;  elbow ext 3/5; sup/pron 3+/5; wrist/hand 3+/5; able to oppose thumb to all digits but reduce fine motor control; jerking movement present; ablet o manipulate nurse call bell adn yonker but difficulty controlling movement due to ataxic movements and proximal weakness RUE Sensation: decreased proprioception, decreased light touch RUE Coordination: decreased fine motor, decreased gross motor LUE Deficits / Details: similar to R but appears overall weaker  Lower Extremity Assessment: Defer to PT evaluation     ADLs   Overall ADL's : Needs assistance/impaired Eating/Feeding: NPO Grooming: Moderate assistance Grooming Details (indicate cue type and reason): able to complete oral care with toothette; difficulty reaching top of head adn comleting cross body for deoderant Upper Body Bathing: Sitting, Moderate assistance Lower Body Bathing: Maximal assistance, Bed level Upper Body Dressing : Maximal assistance Lower Body Dressing: Total assistance Toilet Transfer: +2 for physical assistance, Maximal assistance Toilet Transfer Details (indicate cue type and reason): simulated sit - stand; will need drop arm; use of DLE Toileting- Clothing Manipulation and Hygiene: Total assistance Toileting - Clothing Manipulation Details (indicate cue type and reason): incontinenet per nursing Functional mobility during ADLs: Maximal assistance, +2 for physical assistance     Mobility   Overal bed mobility: Needs Assistance Bed Mobility: Rolling, Supine to Sit, Sit to Supine Rolling: Min assist Sidelying to sit: Max assist, +2 for physical assistance Supine to sit: Max assist, +2 for safety/equipment Sit to supine: Max assist, +2 for physical assistance General bed mobility comments: cues for flexing knee and assist to reach for rail due to proximal weakness for roll bil x 2; Once holding rail, pt able to pull self over to side; sidelying to sit works well; Pt required assist to bring legs off of the bed and to  elevate trunk to sitting. Return to supine with max assist to lift legs and control trunk. Total +2 to slide toward HOB. Max +2 to scoot toward HOB at EOB     Transfers   Overall transfer level: Needs assistance Equipment used: 2 person hand held assist Transfer via Lift Equipment: Stedy Transfers: Sit to/from Stand Sit to Stand: Max assist, +2 physical assistance Squat pivot transfers: Max assist, +2 physical assistance General transfer comment: +2 A with use of bed pad and gait belt however unable to achieve full upright posture; Poor proprioception R LE requiring B knees be blocked and Rt foot blocked. x 2 trials in standing with face to face assist then on 3rd trial attempted Stedy with elevated bed with decreased ability to rise and pt stating fatigue     Ambulation / Gait / Stairs / Wheelchair Mobility   Ambulation/Gait General Gait Details: unsafe/unable at this time     Posture / Balance Dynamic Sitting Balance Sitting balance - Comments: Pt required minguard-min assist to prevent LOB in sitting Balance Overall balance assessment: Needs assistance   Sitting-balance support: Feet supported, Bilateral upper extremity supported Sitting balance-Leahy Scale: Poor Sitting balance - Comments: Pt required minguard-min assist to prevent LOB in sitting Postural control: Posterior lean Standing balance support: Bilateral upper extremity supported, During functional activity Standing balance-Leahy Scale: Zero Standing balance comment: pt has very poor standing balance require total assist to maintain standing     Special needs/care consideration BiPAP orders in acute chart.    Continuous Drip IV : cefazolin, citrate dextrose, feeding supplement   Oxygen: 2L [place]   Special Bed: may need air mattress bed due to immobility and pressure ulcer.    Skin: cracking to medial coccyx, ecchymosis to bilateral arm, hand; fissure to medial, upper coccyx; stage 2 pressure injury to sacrum   Special  service needs: chest physiotherapy orders under respiratory care    Designated visitor: TBD        Previous Home Environment (from acute therapy documentation) Living Arrangements: Alone Available Help at Discharge: Family, Friend(s) Type of Home: Apartment Home Layout: One level Home Access:  (to be determined) Bathroom Shower/Tub: Walk-in shower Bathroom Toilet: Handicapped height Bathroom Accessibility: Yes How Accessible: Accessible via walker Additional Comments: Independent Living Apt   Discharge Living Setting Plans for Discharge Living Setting: Other (Comment) (SNF (CIR admission would be for reduce burden of care)) Type of Home at Discharge: Skilled Nursing Facility Care Facility Name at Discharge: [facility] (pt lives in ILF which is part of tertiary center with SNF attached) Discharge Home Layout: Other (Comment) (tertiary care center-anticipate it is single level ) Alternate Level Stairs-Rails: None Alternate Level Stairs-Number of Steps: NA Discharge Home Access: Level entry Discharge Bathroom Shower/Tub: Other (comment) Discharge Bathroom Toilet:  (unknow, per center) Discharge Bathroom Accessibility: Yes How Accessible: Other (comment) (per SNF) Does the patient have any problems obtaining your medications?: No   Social/Family/Support Systems Patient Roles: Other (Comment) (close to her two neices ) Contact Information: Wendy (neice): [phone]; Kimberly (neice): [phone] Anticipated Caregiver: SNF  Anticipated Caregiver's Contact Information: see above for Wendy and Kimberly  Ability/Limitations of Caregiver: SNF is plan  Caregiver Availability: Other (Comment) (per SNF) Discharge Plan Discussed with Primary Caregiver: Yes (pt and Wendy (both understand plan for CIR then SNF)) Is Caregiver In Agreement with Plan?: Yes Does Caregiver/Family have Issues with Lodging/Transportation while Pt is in Rehab?: No     Goals Patient/Family Goal for  Rehab: PT/OT: Min/Mod A; SLP: Supervision Expected length of stay: 28-32 days Cultural Considerations: NA Pt/Family Agrees to Admission and willing to participate: Yes Program Orientation Provided & Reviewed with Pt/Caregiver Including Roles  & Responsibilities: Yes (pt and Wendy (neice))  Barriers to Discharge:  (plan is to go to SNF. )     Decrease burden of Care through IP rehab admission: Specialzed equipment needs, Diet advancement, Decrease number of caregivers and Patient/family education     Possible need for SNF placement upon discharge: Yes, plan is for pt to come to CIR for reduced burden of care and transition to SNF for additional care. Please see the above section for details on plan.      Patient Condition: This patient's medical and functional status has changed since the consult dated: 04/14/20 in which the Rehabilitation Physician determined and documented that the patient's condition is appropriate for intensive rehabilitative care in an inpatient rehabilitation facility. See "History of Present Illness" (above) for medical update. Functional changes are: improvement in bed mobility with rolling as pt was Max A and now Min A;  pt has been

## 2020-04-15 NOTE — Progress Notes (Addendum)
NEUROLOGY PROGRESS NOTE  Subjective: No new complaints. Still tremulous all over.   Exam: Vitals:   04/15/20 0700 04/15/20 0747  BP: (!) 148/68   Pulse: 94   Resp: 18   Temp:  98.1 F (36.7 C)  SpO2: 100%     Physical Exam  Constitutional: Appears well-developed and well-nourished.  Eyes: No scleral injection HENT: No OP obstrucion Head: Normocephalic.  Cardiovascular: Palpable Respiratory: Effort normal, non-labored breathing GI: Soft.  No distension. There is no tenderness.  Skin: WDI   Neuro:  Mental Status: Alert, oriented, thought content appropriate.  Speech fluent without evidence of aphasia but tremulous.  Able to follow 3 step commands without difficulty. Hypophonic. Cranial Nerves: II:  Visual fields grossly normal,  III,IV, VI: ptosis not present, extra-ocular motions intact bilaterally pupils equal, round, reactive to light and accommodation V,VII: smile symmetric, facial light touch sensation normal bilaterally VIII: hearing normal bilaterally Motor: Bilateral dorsiflexion and plantar flexion 5/5, bilateral knee flexion, knee extension, hip flexion 2-3/5.  Bilateral biceps 4/5, bilateral tricep extension 3/5, bilateral shoulder abduction 2/5. Sensory: Pinprick and light touch intact throughout, bilaterally Deep Tendon Reflexes: No reflexes at the ankle, no reflexes with knee jerk, 1+ reflexes brachial radialis    Medications:  Scheduled: . chlorhexidine  15 mL Mouth Rinse BID  . Chlorhexidine Gluconate Cloth  6 each Topical Daily  . docusate  100 mg Per Tube BID  . enoxaparin  40 mg Subcutaneous Daily  . free water  100 mL Per Tube Q8H  . heparin sodium (porcine)  1,000 Units Intracatheter Once  . insulin aspart  0-15 Units Subcutaneous Q4H  . mouth rinse  15 mL Mouth Rinse q12n4p  . pantoprazole sodium  40 mg Per Tube Daily  . polyethylene glycol  17 g Oral Daily  . sodium chloride flush  10-40 mL Intracatheter Q12H  . sodium chloride flush  10-40  mL Intracatheter Q12H   Continuous: .  ceFAZolin (ANCEF) IV Stopped (04/15/20 0434)  . citrate dextrose    . feeding supplement (OSMOLITE 1.2 CAL) 1,000 mL (04/14/20 0959)   GEZ:[id number] (TYLENOL) oral liquid 160 mg/5 mL, acetaminophen, diphenhydrAMINE, guaiFENesin, heparin sodium (porcine), ipratropium-albuterol, sodium chloride flush, sodium chloride flush  Pertinent Labs/Diagnostics: Brain, C-spine, T-spine imaging with age-related changes and DJD however no cord compression   Assessment:  79 year old female with acute neuropathic exam, in the setting of recent Enbrel dose for psoriasis.  MRI brain, C-spine, T-spine negative for acute process.  CSF with no evidence of albumin no cytological dissociation but did have severe hyponatremia with 4 doses of IVIG, which was stopped at OSH. 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 Would have benefited from a EMG/nerve conduction study to get confirmatory test-but not available at our center unfortunately. Patient had poor response to IVIG and started on plasma exchange.   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  Presence of hyponatremia goes in favor of GBS -Provoked seizure in the setting of hyponatremia-not on antiepileptic drug secondary to resolved  Recommendations: -Completed 5d PLEX. Can get the cath out. -We will need PT OT -Out of bed in chair as tolerated -Speech evaluated - MBS recommended -Outpatient neurology follow-up with neuromuscular specialist either in town or at an academic institution around town  04/15/2020, 7:57 AM -- Amie Portland, MD [facility] Pager: [phone] If 7pm to 7am, please call on  call as listed on AMION.

## 2020-04-15 NOTE — Progress Notes (Addendum)
NAME:  Alison Huffman, MRN:  [id number], DOB:  April 24, 1941, LOS: 36 ADMISSION DATE:  04/03/2020, CONSULTATION DATE:  04/03/2020 REFERRING MD:  Dr. Hal Hope, [facility], CHIEF COMPLAINT:  Respiratory failure   Brief History   79 yo female former smoker transferred to [facility] from [facility] on 6/26 with weakness, numbness, and tingling after being started on Enbrel for psoriasis. Patient developed severe hyponatremia, had a seizure and was transferred to [facility] on 7/03 for further neurologic assessment.  Required intubation for airway protection and transferred to ICU. 7/06 patient had code blue and sustained bilateral pneumothoraces after bag ventilation for which bilateral chest tubes were placed. Patient started on PLEX 7/7 per neuro recommendation. Patient extubated on 7/11 and weaning HFNC.    Past Medical History  CAD, s/p PM, GERD, HTN, HLD, Psoriasis, Arthritis, OSA  Significant Hospital Events   6/26 Admit to [facility] and seen by neurology 6/28 start IVIG  7/02 seen by nephrology at [facility] >> d/c IVIG due to concern about this causing dilutional hyponatremia 7/03 transfer from [facility]; possible seizure, started  keppra, intubated, transferred to ICU 7/6: unresponsive during MRI, Code Blue called, levophed restarted for 2.5 hours, limited MRI without significant disease findings 7/7: started PLEX treatment for GBS  7/9: Making progress, however still with weak cough, tolerating pressure support ventilation but unable to ventilate adequately with pressure support less than 10 cmH2O, core track tube placed  7/11 NIF -40 good F/VT mechanics less accessory use but now w/ pulm edema Consults:  Nephrology [facility]) Neurology  Procedures:  ETT 7/03 >> extubated 7/11  Significant Diagnostic Tests:   CT angio head/neck 6/26 >> 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 S/p 5th plex therapy on 10L but sats never below 99 so will decrease to 8L. Stable for transfer to intermediate care unit.  Objective   Blood pressure (!) 148/68, pulse 94, temperature 98.1 F (36.7 C), temperature source Oral, resp. rate 18, weight 71.6 kg, SpO2 100 %.        Intake/Output Summary (Last 24 hours) at 04/15/2020 0803 Last data filed at 04/15/2020 0700 Gross per 24 hour  Intake 3020 ml  Output 925 ml  Net 2095 ml   Filed Weights   04/12/20 0500 04/13/20 0419 04/15/20 0500  Weight: 73 kg 73.5 kg 71.6 kg   Examination: General : elderly female sitting upright in bed with oxygen mask, in NAD  HEENT: MMM, CorTrak in place  Pulmonary : fine crackles at bases, improved from prior exam, on HFNC 10L with appropriate saturations, aeration improved  Cardiac: RRR without murmur or gallops Abdomen: BS present, no tenderness to palpation  Extremities: no LE edema  Neuro : alert and oriented, improved grip strength and upper extremities movement against minimal resistance, sensation remains intact in upper and lower extremities, LE weakness remains    Resolved Hospital Problem list   Requirement for vasopressors  Bilateral pneumothoraces status post intubation resolved Hyponatremia  Seizures secondary to hyponatremia  Assessment & Plan:  Acute hypoxic respiratory failure with compromised airway 2nd to seizure in setting of hyponatremia and progressive weakness Hx of OSA MSStaph Aureus pulmonary infiltrates with stridor (resolved stridor) WBC stable around 14. Afebrile overnight.  - continue  ancef to end 7/18   -titrate oxygen for sats >90. Stats have been no lower than 99% per chart review -down to 8L..... update now to 3L  Hypernatremia, resolved Na 144 - change fwf 2m q4 - follow daily BMP     Numbness/weakness progressing from lower extremities up with a working diagnosis of atypical GBS, failed IVIG - s/p PLEX 5/5 - neurology recs: outpatient follow up and physical therapy -consulted inpt rehab for which she qualifies, CM working with insurance to determine ability to go.   Elevated Alk Phos, resolved Alk Pho, AST and ALT all down trending. RUQ ultrasound on 7/12 with gallbladder sludge, borderline hepatic steatosis, recommending repeat RUQ ultrasound in 1 year.   - recommend outpatient follow up    Prediabetes with Hyperglycemia   CBG acceptable range  -a1c 6 - continue moderate SSI  Chronic HFpEF, Hx of HTN, HLD. - Holding Crestor and Diovan - Continuing telemetry monitoring  Mild anemia w/out evidence of bleeding-->suspect some degree of either hemodilution or even some hemolysis w/ PLEX - Trend CBC  Hx of psoriasis. Hold Enbrel, some question as to whether this could have been a contributing factor  Urinary retention:  Keep Foley  Best practice:  Diet: tube feeds DVT prophylaxis: lovenox GI prophylaxis: protonix Mobility: bed rest Code Status: full code Disposition: stable [facility] today[facility] will sign off at this time. Please call with any further questions.  Family communication: discussed with pt.  Labs   CBC: Recent Labs  Lab 04/09/20 0350 04/09/20 0350 04/11/20 0454 04/11/20 0454 04/11/20 2115 04/12/20 0552 04/13/20 0421 04/14/20 0518 04/15/20 0404  WBC 10.8*   < > 10.4  --   --  14.7* 15.1* 14.0* 13.9*  NEUTROABS 8.5*  --   --   --   --   --   --   --   --   HGB 10.0*   < > 7.6*   < > 10.9* 10.5* 10.1* 10.0* 10.5*  HCT 30.9*   < > 24.6*   < > 32.0* 33.3* 32.7* 31.8* 33.6*  MCV 94.8   < >  97.2  --   --  94.3 96.2 95.2 96.6  PLT 149*   < > 191  --   --  279 252 308 343   < > = values in this interval not displayed.    Basic Metabolic Panel: Recent Labs  Lab 04/11/20 0454 04/11/20 0454 04/11/20 1326 04/11/20 2115 04/12/20 0552 04/12/20 1340 04/13/20 0421 04/14/20 0518 04/15/20 0404  NA 142   < > 140   < > 146* 147* 146* 145 144  K 3.3*   < > 3.4*   < > 3.6 3.9 3.8 4.1 3.8  CL 111   < > 105  --  106 107 112* 110 110  CO2 24   < > 25  --  _0 GLUCOSE 144*   < > 135*  --  128* 113* 183* 212* 108*  BUN 14   < > 13  --  14 17 24* 20 16  CREATININE 0.31*   < > 0.38*  --  0.48 0.46 0.45 0.40* 0.33*  CALCIUM 8.9   < > 9.0  --  9.1 9.1 9.1 8.8* 8.7*  MG 2.0  --  1.9  --   --   --   --   --   --    < > = values in this interval not displayed.   GFR: Estimated Creatinine Clearance: 56.8 mL/min (A) (by C-G formula based on SCr of 0.33 mg/dL (L)). Recent Labs  Lab 04/12/20 0552 04/13/20 0421 04/14/20 0518 04/15/20 0404  WBC 14.7* 15.1* 14.0* 13.9*    Liver Function Tests: Recent Labs  Lab 04/11/20 0454 04/12/20 0552 04/13/20 0421 04/14/20 0518  AST 53* 48* 60* 37  ALT 52* 62* 54* 58*  ALKPHOS 153* 150* 71 72  BILITOT 0.9 1.0 1.1 0.6  PROT 5.5* 6.3* 5.4* 5.5*  ALBUMIN 3.4* 3.6 3.8 3.5   CBG: Recent Labs  Lab 04/14/20 1551 04/14/20 2001 04/15/20 0001 04/15/20 0341 04/15/20 0746  GLUCAP 123* 89 139* 78 126*   care time: care time 38 mins. This represents my time independent of the NPs time taking care of the pt. This is excluding procedures.    [facility] 04/15/2020, 8:03 AM

## 2020-04-15 NOTE — Progress Notes (Signed)
Physical Therapy Treatment Patient Details Name: Alison Huffman MRN: [id number] DOB: [birth year] Today's Date: 04/15/2020    History of Present Illness 79 yo transferred from  [facility] on 6/24 due to progressive onset paresthesias in hands/feet, eventually with weakness now progressed to total body including head and trunk. Pt had seizure in setting of hyponatremia. 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 PMH: psoriatic arthritis, CAD, PPM, HTN.    PT Comments    Pt seen in conjunction with OT and able to participate in rolling and sitting EOB. Pt with ataxia in limbs and trunk with mobility coupled with decreased proprioception impacting mobility. Pt remains unable to fully stand with 2 person assist and deferred OOB per RN today. Pt educated for continued therapy, strengthening and balance training.   Pt on 8L HFNC on arrival able to maintain 95% on RA by end of session with pt on RA with RN aware at end of session HR 88   Follow Up Recommendations  CIR;Supervision/Assistance - 24 hour     Equipment Recommendations  Wheelchair (measurements PT);Wheelchair cushion (measurements PT);Rolling walker with 5" wheels;3in1 (PT);Hospital bed    Recommendations for Other Services       Precautions / Restrictions Precautions Precautions: Fall Precaution Comments: gross limb ataxia x4; significant weakness     Mobility  Bed Mobility Overal bed mobility: Needs Assistance Bed Mobility: Rolling;Supine to Sit;Sit to Supine Rolling: Min assist Sidelying to sit: Max assist;+2 for physical assistance   Sit to supine: Max assist;+2 for physical assistance   General bed mobility comments: cues for flexing knee and assist to reach for rail due to proximal weakness for roll bil x 2; Once holding rail, pt able to pull self over to side; sidelying to sit works well; Pt required assist to bring legs off of the bed and to elevate trunk to sitting. Return to supine with max assist to  lift legs and control trunk. Total +2 to slide toward HOB. Max +2 to scoot toward HOB at EOB  Transfers Overall transfer level: Needs assistance Equipment used: 2 person hand held assist Transfers: Sit to/from Stand Sit to Stand: Max assist;+2 physical assistance         General transfer comment: +2 A with use of bed pad and gait belt however unable to achieve full upright posture; Poor proprioception R LE requiring B knees be blocked and Rt foot blocked. x 2 trials in standing with face to face assist then on 3rd trial attempted Stedy with elevated bed with decreased ability to rise and pt stating fatigue  Ambulation/Gait             General Gait Details: unsafe/unable at this time   Stairs             Wheelchair Mobility    Modified Rankin (Stroke Patients Only)       Balance Overall balance assessment: Needs assistance Sitting-balance support: Feet supported;Bilateral upper extremity supported Sitting balance-Leahy Scale: Poor Sitting balance - Comments: Pt required minguard-min assist to prevent LOB in sitting Postural control: Posterior lean Standing balance support: Bilateral upper extremity supported;During functional activity Standing balance-Leahy Scale: Zero Standing balance comment: pt has very poor standing balance require total assist to maintain standing                            Cognition Arousal/Alertness: Awake/alert Behavior During Therapy: Flat affect;Anxious Overall Cognitive Status: No family/caregiver present to determine baseline cognitive functioning  General Comments: decreased processing and awareness of limb placement      Exercises General Exercises - Upper Extremity Shoulder Flexion: AAROM;Both;10 reps;Supine Shoulder ABduction: AROM;Both;10 reps;Supine Elbow Flexion: AAROM;Both;10 reps;Seated Elbow Extension: AAROM;Both;10 reps;Seated Digit Composite Flexion:  AROM;Both;10 reps Composite Extension: AROM;Both;10 reps;Seated Other Exercises Other Exercises: incentive spirometer x 10 - ablet o pull 300 ml    General Comments        Pertinent Vitals/Pain Pain Assessment: No/denies pain    Home Living Family/patient expects to be discharged to:: Private residence Living Arrangements: Alone Available Help at Discharge: Family;Friend(s) Type of Home: Apartment     Home Layout: One level Home Equipment: None Additional Comments: Independent Living Apt    Prior Function Level of Independence: Independent      Comments: Still independent with IADL; no prior falls history; walked ~1 miile for activity with no AD   PT Goals (current goals can now be found in the care plan section) Acute Rehab PT Goals Patient Stated Goal: regain independent mobility Progress towards PT goals: Progressing toward goals    Frequency    Min 3X/week      PT Plan Current plan remains appropriate    Co-evaluation PT/OT/SLP Co-Evaluation/Treatment: Yes Reason for Co-Treatment: Complexity of the patient's impairments (multi-system involvement);For patient/therapist safety PT goals addressed during session: Mobility/safety with mobility;Proper use of DME OT goals addressed during session: ADL's and self-care;Strengthening/ROM      AM-PAC PT "6 Clicks" Mobility   Outcome Measure  Help needed turning from your back to your side while in a flat bed without using bedrails?: A Lot Help needed moving from lying on your back to sitting on the side of a flat bed without using bedrails?: Total Help needed moving to and from a bed to a chair (including a wheelchair)?: Total Help needed standing up from a chair using your arms (e.g., wheelchair or bedside chair)?: Total Help needed to walk in hospital room?: Total Help needed climbing 3-5 steps with a railing? : Total 6 Click Score: 7    End of Session Equipment Utilized During Treatment: Gait  belt;Oxygen Activity Tolerance: Patient tolerated treatment well;Patient limited by fatigue Patient left: in bed;with call bell/phone within reach;with bed alarm set (returned to bed for procedure per RN request) Nurse Communication: Mobility status;Need for lift equipment PT Visit Diagnosis: Unsteadiness on feet (R26.81);Other abnormalities of gait and mobility (R26.89);Difficulty in walking, not elsewhere classified (R26.2);Muscle weakness (generalized) (M62.81)     Time: 3335-4562 PT Time Calculation (min) (ACUTE ONLY): 27 min  Charges:  $Therapeutic Activity: 8-22 mins                     Iceis Knab P, PT Acute Rehabilitation Services Pager: [phone] Office: [place] 04/15/2020, 1:00 PM

## 2020-04-15 NOTE — Progress Notes (Signed)
Inpatient Rehab Admissions:   I met with pt at the bedside as follow up from PM&R consult. Please see formal consult by Dr. Posey Pronto from 7/14 for details. Discussed recommended rehab program, expectations of our program, anticipated LOS, and functional gains expected. Pt understands the plan would be to decrease her burden of care prior to going to SNF for additional support. Pt interested in this program and wanted me to speak with her niece [name] to relay this information. I contacted [name] and she is in favor of this program as well. Discussed we will continue to follow for tolerance and medical stability prior to admitting to rehab. Would like to see pt tolerate a bit more activity prior to CIR placement to ensure she can participate in an intensive rehab program. Will continue to follow for progress.    Raechel Ache, OTR/L  Rehab Admissions Coordinator  [phone] 04/15/2020 2:04 PM'

## 2020-04-15 NOTE — Progress Notes (Signed)
Report called to receiving RN 2w02. Patient with no complaints at the current time. Will transfer via bed.

## 2020-04-15 NOTE — Progress Notes (Signed)
Modified Barium Swallow Progress Note  Patient Details  Name: Alison Huffman MRN: [id number] Date of Birth: [birth year]  Today's Date: 04/15/2020  Modified Barium Swallow completed.  Full report located under Chart Review in the Imaging Section.  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 Teaspoons of thin, nectar, honey and puree were given. Pt had silent aspiration of thin liquids before the swallow and after. However, across textures use of a supraglottic swallow, swallow x2 and throat clear/expectoration effort after every 2-3 sips aided in early, more effortful airway protection and removal of pharyngeal residue present post swallow. Presence of COrtrak was clearly inhibiting epiglottic deflection, but pt is not ready to rely on oral intake for nutrition and medication at this time given silent nature of aspiration, weakness and muscle fatigue. However, pt is capable of taking teaspoons of thin or nectar thick water or ice after oral care with staff. Pt must be instructed to hold the liquid in her mouth, hold her breath and swallow hard and then swallow again. After every 2-3 sips she should clear her throat and espectorate residue. Pt demosntrated competency with these strategies over 5 trials with only one verbal cue. Will f/u for subjective improvement and readiness for re-test.    Swallow Evaluation Recommendations       SLP Diet Recommendations: Ice chips PRN after oral care;Free water protocol after oral care   Liquid Administration via: Spoon   Medication Administration: Via alternative means       Compensations: Effortful swallow;Multiple dry swallows after each bite/sip (suraglottic swallow)                Alison Huffman, Alison Huffman 04/15/2020,1:30  PM

## 2020-04-16 ENCOUNTER — Inpatient Hospital Stay (HOSPITAL_COMMUNITY): Payer: Medicare Other

## 2020-04-16 ENCOUNTER — Encounter (HOSPITAL_COMMUNITY): Payer: Self-pay | Admitting: Pulmonary Disease

## 2020-04-16 LAB — CBC
HCT: 31.9 % — ABNORMAL LOW (ref 36.0–46.0)
Hemoglobin: 10.1 g/dL — ABNORMAL LOW (ref 12.0–15.0)
MCH: 30.2 pg (ref 26.0–34.0)
MCHC: 31.7 g/dL (ref 30.0–36.0)
MCV: 95.5 fL (ref 80.0–100.0)
Platelets: 335 10*3/uL (ref 150–400)
RBC: 3.34 MIL/uL — ABNORMAL LOW (ref 3.87–5.11)
RDW: 13.7 % (ref 11.5–15.5)
WBC: 11.9 10*3/uL — ABNORMAL HIGH (ref 4.0–10.5)
nRBC: 0 % (ref 0.0–0.2)

## 2020-04-16 LAB — GLUCOSE, CAPILLARY
Glucose-Capillary: 116 mg/dL — ABNORMAL HIGH (ref 70–99)
Glucose-Capillary: 117 mg/dL — ABNORMAL HIGH (ref 70–99)
Glucose-Capillary: 123 mg/dL — ABNORMAL HIGH (ref 70–99)
Glucose-Capillary: 129 mg/dL — ABNORMAL HIGH (ref 70–99)
Glucose-Capillary: 139 mg/dL — ABNORMAL HIGH (ref 70–99)
Glucose-Capillary: 149 mg/dL — ABNORMAL HIGH (ref 70–99)

## 2020-04-16 LAB — BASIC METABOLIC PANEL
Anion gap: 9 (ref 5–15)
BUN: 11 mg/dL (ref 8–23)
CO2: 29 mmol/L (ref 22–32)
Calcium: 9 mg/dL (ref 8.9–10.3)
Chloride: 104 mmol/L (ref 98–111)
Creatinine, Ser: 0.35 mg/dL — ABNORMAL LOW (ref 0.44–1.00)
GFR calc Af Amer: >60 mL/min (ref >60)
GFR calc non Af Amer: >60 mL/min (ref >60)
Glucose, Bld: 119 mg/dL — ABNORMAL HIGH (ref 70–99)
Potassium: 3.8 mmol/L (ref 3.5–5.1)
Sodium: 142 mmol/L (ref 135–145)

## 2020-04-16 NOTE — Progress Notes (Signed)
Inpatient Rehabilitation-Admissions Coordinator   Met with pt bedside. She continues to demonstrate significant debility with very weak cough and some difficulty with secretion management. Pt is still interested in CIR program here at [facility]. I do not have a bed open for her today and will follow up Monday for possible admit, pending medical readiness and bed availability.   Please call if questions.   Raechel Ache, OTR/L  Rehab Admissions Coordinator  [phone] 04/16/2020 3:04 PM

## 2020-04-16 NOTE — Progress Notes (Signed)
  Speech Language Pathology Treatment: Dysphagia  Patient Details Name: Alison Huffman MRN: [id number] DOB: 08-Mar-1941 Today's Date: 04/16/2020 Time: 7412-8786 SLP Time Calculation (min) (ACUTE ONLY): 26 min  Assessment / Plan / Recommendation Clinical Impression  Pt alert, engaged and receptive during skilled dysphagia therapy focusing on compensatory strategies across po trials with repetition. She could not recall strategies from [facility] but with mod-max cueing and repetition she carried over to to min cues at end of session. Visual cues on white board and paper with nectar thick for superglottic swallow (hold breath, hard swallow, repeat swallow and throat clear). Other than volitional performance of strategies she had several delayed throat clears otherwise appeared to tolerate multiple trials (approx 12). Encouraged her to practice over the weekend with her saliva as well. RN- please continue to offer liquids - if giving thin this needs to be only water, if nectar she may have liquid of choice for 2-4 teaspoons using strategies on signs in room).    HPI HPI: Pt is an 79 yo female with presumed [place]. She is a former smoker transferred to [facility] from [place] on 6/26 with weakness, numbness, and tingling after being started on Enbrel for psoriasis. Patient developed severe hyponatremia, had a seizure and was transferred to [facility] on 7/03 for further neurologic assessment.  Required intubation for airway protection on 7/3 and waws extubated on 7/11 but has needed biPAP intermittently for respiratory distress. Pt became unresponsive during MRI on 7/6 and had code blue; sustained bilateral pneumothoraces after bag ventilation for which bilateral chest tubes were placed. MRI was negative for acute changes. Patient started on PLEX plasmaphoresis 7/7. CXR 7/12: Bibasilar atelectasis/infiltrate with no significant interval change.Cortrak placed 7/9      SLP Plan   Continue with current plan of care       Recommendations  Diet recommendations: Other(comment) (trials with ST/RN,thin or nectar) Liquids provided via: Teaspoon Medication Administration: Via alternative means Compensations: Effortful swallow;Multiple dry swallows after each bite/sip;Clear throat after each swallow                Oral Care Recommendations: Oral care QID;Staff/trained caregiver to provide oral care Follow up Recommendations: Inpatient Rehab SLP Visit Diagnosis: Dysphagia, pharyngeal phase (R13.13) Plan: Continue with current plan of care       GO                Houston Siren 04/16/2020, 9:31 AM  Orbie Pyo Colvin Caroli.Ed Risk analyst [phone] Office [phone]

## 2020-04-16 NOTE — H&P (Signed)
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 Huffman is an 79 year old female with history of CAD, PPM,  HTN, OSA, psoriasis arthritis --transitioned from Humira to Enbrel recently; who was admitted to [facility] on 03/27/20 with reports of numbness and tingling which started in BLE,progressed to torso and BUE, as well as weakness.  Neurology consulted recommended LP due to concerns of GBS with progressive numbness/weakness with decreased reflexes. LP negative but she was started on IVIG empirically. She developed significant hyponatremia with drop in  Na-119 felt to be due to IVIG and nephrology recommended discontinuation of IVIG as well as FR. Patient without improvement and was transferred to [facility] with plans for MRI on 07/03 but developed unresponsiveness with blank stare and developed respiratory distress requiring intubation. She was treated with Keppra briefly due to seizure in setting of hyponatremia.  EEG showed diffuse slowing without seizure activity.   While in MRI, she developed respiratory distress due to mucous plugging with decrease in LOC and code blue and required manual ventilation. She developed bilateral pneumothoraces due to high pressure requiring bilateral chest tube placement. MRI brain and was negative for acute abnormality. MRI cervical/thoracic spine showed cervical spondylosis but was negative for significant stenosis or cord abnormality.  She was started on plasmapheresis on 07/07 with improvement in strength. She was extubated  07/11 but developed respiratory distress later that day requiring BIPAP. She was started on Solumedrol due to stridor and Cefepime added for staph HCAP. Hypoxia improving and she has been weaned to   Foley in place due to urinary retention.  NPO recommended due to severe oropharyngeal dysphagia with difficulty in airway protection due to weakness and high aspiration risk--OK for trials of  teaspoon of thins/nectas with ST/RN.  Dr. 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     Review of Systems  Constitutional: Negative for chills and fever.  HENT: Negative for hearing loss and tinnitus.   Eyes: Negative for blurred vision and double vision.  Respiratory: Positive for cough and shortness of breath.   Cardiovascular: Negative for chest pain and palpitations.  Gastrointestinal: Negative for heartburn and nausea.  Musculoskeletal: Positive for joint pain (biltarel thumbs> hands). Negative for myalgias.  Neurological: Positive for sensory change and weakness. Negative for dizziness and headaches.  Psychiatric/Behavioral: The patient is not nervous/anxious.      Past Medical History:  Diagnosis Date  . Ankle pain, right   . Atypical chest pain    a. 10/2018 MV: EF 57%. No ischemia/infarct.  . Coronary artery disease   . Diastolic dysfunction    a. 04/2018 Echo: EF 55-60%, no rwma, Gr1 DD. Triv AI, mild MR. Mod dil LA. Nl RV fxn. PASP nl.  . GERD (gastroesophageal reflux disease)   . Headache    "dull one sometimes daily, at least weekly in last couple months" (07/22/2018)  . Hyperlipidemia   . Hypertension   . 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 Joint pain in fingers of right hand   . Lichen sclerosus   . Osteopenia   . Pneumonia 2012? X 1  . 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 Psoriasis   . Psoriatic arthritis ([place])    " dx'd the 1st of this year, 2019" (07/22/2018)  . Psoriatic arthritis ([place])   .  Rosacea   . Seborrheic keratosis   . Second degree heart block    a. 07/2018 s/p SJM Assurity MRI model PM2271 (Ser# [id number]).  . Sleep apnea    "using nasal pilllows since ~ 12/2017" (07/22/2018)  . TMJ (dislocation of temporomandibular joint)     Past Surgical History:  Procedure  Laterality Date  . APPENDECTOMY    . BREAST CYST ASPIRATION Bilateral   . BREAST CYST EXCISION Right 1978   benign  . COLONOSCOPY    . COLONOSCOPY WITH PROPOFOL N/A 07/10/2019   Procedure: COLONOSCOPY WITH PROPOFOL;  Surgeon: Lollie Sails, MD;  Location: [facility] ENDOSCOPY;  Service: Endoscopy;  Laterality: N/A;  . ESOPHAGOGASTRODUODENOSCOPY (EGD) WITH PROPOFOL N/A 07/10/2019   Procedure: ESOPHAGOGASTRODUODENOSCOPY (EGD) WITH PROPOFOL;  Surgeon: Lollie Sails, MD;  Location: [facility] ENDOSCOPY;  Service: Endoscopy;  Laterality: N/A;  . HYSTEROSCOPY WITH D & C N/A 11/05/2018   Procedure: DILATATION AND CURETTAGE /HYSTEROSCOPY;  Surgeon: Homero Fellers, MD;  Location: [facility] ORS;  Service: Gynecology;  Laterality: N/A;  . INSERT / REPLACE / REMOVE PACEMAKER    . 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 UTERINE POLYPS REMOVAL      Family History  Problem Relation Age of Onset  . Stroke Mother   . Atrial fibrillation Mother   . Breast cancer Mother   . Stroke Father   . Breast cancer Maternal Aunt   . Breast cancer Paternal Aunt     Social History:   Lives alone at [place]. 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  She  reports that she smoked one cigarettes now and then--socially.  She has never used smokeless tobacco. She reports current alcohol use-- once a month. She reports that she does not use drugs.   Allergies  Allergen Reactions  . Nickel    Medications Prior to Admission  Medication Sig Dispense Refill  . acetaminophen (TYLENOL) 325 MG tablet Take 325-650 mg by mouth every 6 (six) hours as needed for mild pain or headache.     . bisacodyl (DULCOLAX) 10 MG suppository Place 1 suppository (10 mg total) rectally daily as needed for moderate constipation. 12 suppository 0  . Clobetasol Prop Emollient Base (CLOBETASOL PROPIONATE E) 0.05 % emollient  cream Apply 1 application topically every Sunday.     [name] desonide (DESOWEN) 0.05 % ointment Apply 1 application topically 2 (two) times daily as needed (for psoriasis).     [name] etanercept (ENBREL) 50 MG/ML injection Inject 50 mg into the skin once a week. LF on 02-19-20 Ds 28    . famotidine (PEPCID) 20 MG tablet Take 20 mg by mouth 2 (two) times daily as needed for heartburn or indigestion.     . Methylcellulose, Laxative, (CITRUCEL PO) Take 1 Dose by mouth daily as needed (constipation).     . metroNIDAZOLE (METROCREAM) 0.75 % cream Apply topically 2 (two) times daily as needed (facial redness/irritation.).     [name] nystatin cream (MYCOSTATIN) Apply 1 application topically 2 (two) times daily as needed for dry skin.     . polyethylene glycol (MIRALAX / GLYCOLAX) 17 g packet Take 17 g by mouth daily as needed for severe constipation. 14 each 0  . rosuvastatin (CRESTOR) 5 MG tablet Take 1 tablet (5 mg total) by mouth daily. 90 tablet 3  . tacrolimus (PROTOPIC) 0.1 % ointment Apply topically  2 (two) times daily as needed (for psoriasis).     . valsartan (DIOVAN) 80 MG tablet TAKE 1 TABLET DAILY (Patient taking differently: Take 80 mg by mouth daily. ) 90 tablet 3  . enoxaparin (LOVENOX) 40 MG/0.4ML injection Inject 0.4 mLs (40 mg total) into the skin daily. 0 mL   . ondansetron (ZOFRAN) 4 MG tablet Take 1 tablet (4 mg total) by mouth every 6 (six) hours as needed for nausea. 20 tablet 0    Drug Regimen Review  Drug regimen was reviewed and remains appropriate with no significant issues identified  Home: Home Living Family/patient expects to be discharged to:: Private residence Living Arrangements: Alone Available Help at Discharge: Family, Friend(s) Type of Home: Apartment Home Access:  (to be determined) Home Layout: One level Bathroom Shower/Tub: Multimedia programmer: Handicapped height Bathroom Accessibility: Yes Home Equipment: None Additional Comments: Independent Living Apt     Functional History: Prior Function Level of Independence: Independent Comments: Still independent with IADL; no prior falls history; walked ~1 miile for activity with no AD  Functional Status:  Mobility: Bed Mobility Overal bed mobility: Needs Assistance Bed Mobility: Rolling, Supine to Sit, Sit to Supine Rolling: Min assist Sidelying to sit: Max assist, +2 for physical assistance Supine to sit: Max assist, +2 for safety/equipment Sit to supine: Max assist, +2 for physical assistance General bed mobility comments: cues for flexing knee and assist to reach for rail due to proximal weakness for roll bil x 2; Once holding rail, pt able to pull self over to side; sidelying to sit works well; Pt required assist to bring legs off of the bed and to elevate trunk to sitting. Return to supine with max assist to lift legs and control trunk. Total +2 to slide toward HOB. Max +2 to scoot toward HOB at EOB Transfers Overall transfer level: Needs assistance Equipment used: 2 person hand held assist Transfer via [place]: Stedy Transfers: Sit to/from Stand Sit to Stand: Max assist, +2 physical assistance Squat pivot transfers: Max assist, +2 physical assistance General transfer comment: +2 A with use of bed pad and gait belt however unable to achieve full upright posture; Poor proprioception R LE requiring B knees be blocked and Rt foot blocked. x 2 trials in standing with face to face assist then on 3rd trial attempted Stedy with elevated bed with decreased ability to rise and pt stating fatigue Ambulation/Gait General Gait Details: unsafe/unable at this time    ADL: ADL Overall ADL's : Needs assistance/impaired Eating/Feeding: NPO Grooming: Moderate assistance Grooming Details (indicate cue type and reason): able to complete oral care with toothette; difficulty reaching top of head adn comleting cross body for deoderant Upper Body Bathing: Sitting, Moderate assistance Lower Body Bathing:  Maximal assistance, Bed level Upper Body Dressing : Maximal assistance Lower Body Dressing: Total assistance Toilet Transfer: +2 for physical assistance, Maximal assistance Toilet Transfer Details (indicate cue type and reason): simulated sit - stand; will need drop arm; use of DLE Toileting- Clothing Manipulation and Hygiene: Total assistance Toileting - Clothing Manipulation Details (indicate cue type and reason): incontinenet per nursing Functional mobility during ADLs: Maximal assistance, +2 for physical assistance  Cognition: Cognition Overall Cognitive Status: No family/caregiver present to determine baseline cognitive functioning Orientation Level: Oriented X4 Cognition Arousal/Alertness: Awake/alert Behavior During Therapy: Flat affect, Anxious Overall Cognitive Status: No family/caregiver present to determine baseline cognitive functioning General Comments: decreased processing and awareness of limb placement   Blood pressure 135/60, pulse 92, temperature 98.3 F (  36.8 C), temperature source Oral, resp. rate (!) 21, weight 74.6 kg, SpO2 100 %. Physical Exam Constitutional:      Appearance: Normal appearance.  HENT:     Head: Normocephalic.     Right Ear: External ear normal.     Left Ear: External ear normal.     Nose:     Comments: NGT    Mouth/Throat:     Pharynx: No oropharyngeal exudate.     Comments: Thrush on tongue Eyes:     Extraocular Movements: Extraocular movements intact.     Pupils: Pupils are equal, round, and reactive to light.  Cardiovascular:     Rate and Rhythm: Normal rate and regular rhythm.     Heart sounds: No murmur heard.  No friction rub.  Pulmonary:     Effort: Pulmonary effort is normal. No respiratory distress.     Breath sounds: No stridor. No wheezing or rhonchi.  Abdominal:     General: Bowel sounds are normal. There is no distension.     Palpations: Abdomen is soft.     Tenderness: There is no abdominal tenderness.    Musculoskeletal:        General: No swelling, deformity or signs of injury.  Skin:    General: Skin is warm and dry.     Comments: Stage II sacral wound  Neurological:     Mental Status: She is alert.     Comments: Pt alert and oriented x3. Weak cough and phonation. Sl dysarthria. Reasonable insight and awareness. RUE 2- to 3+ prox to distal. LUE 2 to 4/5 prox to distal. BLE 3- to 3/5 HF, KE and 3/5 ADF/PF. Stocking glove sensory loss below knees bilaterally and along finger tips of both hands. DTR's absent  Psychiatric:        Mood and Affect: Mood normal.        Behavior: Behavior normal.     Results for orders placed or performed during the hospital encounter of 04/03/20 (from the past 48 hour(s))  Glucose, capillary     Status: Abnormal   Collection Time: 04/14/20 11:49 AM  Result Value Ref Range   Glucose-Capillary 117 (H) 70 - 99 mg/dL    Comment: Glucose reference range applies only to samples taken after fasting for at least 8 hours.  Glucose, capillary     Status: Abnormal   Collection Time: 04/14/20  3:51 PM  Result Value Ref Range   Glucose-Capillary 123 (H) 70 - 99 mg/dL    Comment: Glucose reference range applies only to samples taken after fasting for at least 8 hours.  Glucose, capillary     Status: None   Collection Time: 04/14/20  8:01 PM  Result Value Ref Range   Glucose-Capillary 89 70 - 99 mg/dL    Comment: Glucose reference range applies only to samples taken after fasting for at least 8 hours.  Glucose, capillary     Status: Abnormal   Collection Time: 04/15/20 12:01 AM  Result Value Ref Range   Glucose-Capillary 139 (H) 70 - 99 mg/dL    Comment: Glucose reference range applies only to samples taken after fasting for at least 8 hours.  Glucose, capillary     Status: None   Collection Time: 04/15/20  3:41 AM  Result Value Ref Range   Glucose-Capillary 78 70 - 99 mg/dL    Comment: Glucose reference range applies only to samples taken after fasting for at  least 8 hours.  CBC     Status:  Abnormal   Collection Time: 04/15/20  4:04 AM  Result Value Ref Range   WBC 13.9 (H) 4.0 - 10.5 K/uL   RBC 3.48 (L) 3.87 - 5.11 MIL/uL   Hemoglobin 10.5 (L) 12.0 - 15.0 g/dL   HCT 33.6 (L) 36 - 46 %   MCV 96.6 80.0 - 100.0 fL   MCH 30.2 26.0 - 34.0 pg   MCHC 31.3 30.0 - 36.0 g/dL   RDW 14.0 11.5 - 15.5 %   Platelets 343 150 - 400 K/uL   nRBC 0.0 0.0 - 0.2 %    Comment: Performed at [facility], [place] [address] [place], [place] [address]  Basic metabolic panel     Status: Abnormal   Collection Time: 04/15/20  4:04 AM  Result Value Ref Range   Sodium 144 135 - 145 mmol/L   Potassium 3.8 3.5 - 5.1 mmol/L   Chloride 110 98 - 111 mmol/L   CO2 26 22 - 32 mmol/L   Glucose, Bld 108 (H) 70 - 99 mg/dL    Comment: Glucose reference range applies only to samples taken after fasting for at least 8 hours.   BUN 16 8 - 23 mg/dL   Creatinine, Ser 0.33 (L) 0.44 - 1.00 mg/dL   Calcium 8.7 (L) 8.9 - 10.3 mg/dL   GFR calc non Af Amer >60 >60 mL/min   GFR calc Af Amer >60 >60 mL/min   Anion gap 8 5 - 15    Comment: Performed at [facility] [address] [place], [place] [address]  Glucose, capillary     Status: Abnormal   Collection Time: 04/15/20  7:46 AM  Result Value Ref Range   Glucose-Capillary 126 (H) 70 - 99 mg/dL    Comment: Glucose reference range applies only to samples taken after fasting for at least 8 hours.  Glucose, capillary     Status: Abnormal   Collection Time: 04/15/20 11:44 AM  Result Value Ref Range   Glucose-Capillary 123 (H) 70 - 99 mg/dL    Comment: Glucose reference range applies only to samples taken after fasting for at least 8 hours.  Glucose, capillary     Status: None   Collection Time: 04/15/20  4:39 PM  Result Value Ref Range   Glucose-Capillary 76 70 - 99 mg/dL    Comment: Glucose reference range applies only to samples taken after fasting for at least 8 hours.  Glucose, capillary     Status: Abnormal    Collection Time: 04/15/20  7:36 PM  Result Value Ref Range   Glucose-Capillary 100 (H) 70 - 99 mg/dL    Comment: Glucose reference range applies only to samples taken after fasting for at least 8 hours.  Glucose, capillary     Status: Abnormal   Collection Time: 04/15/20 11:06 PM  Result Value Ref Range   Glucose-Capillary 123 (H) 70 - 99 mg/dL    Comment: Glucose reference range applies only to samples taken after fasting for at least 8 hours.  CBC     Status: Abnormal   Collection Time: 04/16/20  1:24 AM  Result Value Ref Range   WBC 11.9 (H) 4.0 - 10.5 K/uL   RBC 3.34 (L) 3.87 - 5.11 MIL/uL   Hemoglobin 10.1 (L) 12.0 - 15.0 g/dL   HCT 31.9 (L) 36 - 46 %   MCV 95.5 80.0 - 100.0 fL   MCH 30.2 26.0 - 34.0 pg   MCHC 31.7 30.0 - 36.0 g/dL  RDW 13.7 11.5 - 15.5 %   Platelets 335 150 - 400 K/uL   nRBC 0.0 0.0 - 0.2 %    Comment: Performed at [facility], [place] [address], [place], [place] [address]  Basic metabolic panel     Status: Abnormal   Collection Time: 04/16/20  1:24 AM  Result Value Ref Range   Sodium 142 135 - 145 mmol/L   Potassium 3.8 3.5 - 5.1 mmol/L   Chloride 104 98 - 111 mmol/L   CO2 29 22 - 32 mmol/L   Glucose, Bld 119 (H) 70 - 99 mg/dL    Comment: Glucose reference range applies only to samples taken after fasting for at least 8 hours.   BUN 11 8 - 23 mg/dL   Creatinine, Ser 0.35 (L) 0.44 - 1.00 mg/dL   Calcium 9.0 8.9 - 10.3 mg/dL   GFR calc non Af Amer >60 >60 mL/min   GFR calc Af Amer >60 >60 mL/min   Anion gap 9 5 - 15    Comment: Performed at [facility] [address], [place], [place] [address]  Glucose, capillary     Status: Abnormal   Collection Time: 04/16/20  3:29 AM  Result Value Ref Range   Glucose-Capillary 139 (H) 70 - 99 mg/dL    Comment: Glucose reference range applies only to samples taken after fasting for at least 8 hours.  Glucose, capillary     Status: Abnormal   Collection Time: 04/16/20  7:16 AM  Result Value Ref Range     Glucose-Capillary 117 (H) 70 - 99 mg/dL    Comment: Glucose reference range applies only to samples taken after fasting for at least 8 hours.   DG CHEST PORT 1 VIEW  Result Date: 04/16/2020 CLINICAL DATA:  79 year old female with history of shortness of breath. EXAM: PORTABLE CHEST 1 VIEW COMPARISON:  Chest x-ray 04/12/2020. FINDINGS: A feeding tube is seen extending into the abdomen, however, the tip of the feeding tube extends below the lower margin of the image. Right internal jugular central venous catheter with tip terminating in the mid superior vena cava. Left-sided pacemaker device in place with lead tips projecting over the expected location of the right atrium and right ventricle. Lung volumes are low, but have increased compared to the prior study. There continues to be some bibasilar opacities (left greater than right), improved compared to the prior study, compatible with resolving areas of atelectasis and/or consolidation in the lower lobes. No definite pleural effusions. No evidence of pulmonary edema. No pneumothorax. Heart size is normal. Upper mediastinal contours are within normal limits. Aortic atherosclerosis. IMPRESSION: 1. Support apparatus, as above. 2. Persistently low but increasing lung volumes with improving bibasilar areas of atelectasis and/or consolidation (left greater than right). 3. Aortic atherosclerosis. Electronically Signed   By: Vinnie Langton M.D.   On: 04/16/2020 06:27   DG Swallowing Func-Speech Pathology  Result Date: 04/15/2020 Objective Swallowing Evaluation: Type of Study: MBS-Modified Barium Swallow Study  Patient Details Name: DEBORRA PHEGLEY MRN: [id number] Date of Birth: 04-16-1941 Today's Date: 04/15/2020 Time: SLP Start Time (ACUTE ONLY): 1030 -SLP Stop Time (ACUTE ONLY): 1050 SLP Time Calculation (min) (ACUTE ONLY): 20 min Past Medical History: Past Medical History: Diagnosis Date . Ankle pain, right  . Atypical chest pain   a. 10/2018 MV: EF 57%. No  ischemia/infarct. . Coronary artery disease  . Diastolic dysfunction   a. 04/2018 Echo: EF 55-60%, no rwma, Gr1 DD. Triv AI, mild  MR. Mod dil LA. Nl RV fxn. PASP nl. . GERD (gastroesophageal reflux disease)  . Headache   "dull one sometimes daily, at least weekly in last couple months" (07/22/2018) . Hyperlipidemia  . Hypertension  . 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 Joint pain in fingers of right hand  . Lichen sclerosus  . Osteopenia  . Pneumonia 2012? X 1 . 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 Psoriasis  . Psoriatic arthritis ([place])   " dx'd the 1st of this year, 2019" (07/22/2018) . Psoriatic arthritis ([place])  . Rosacea  . Seborrheic keratosis  . Second degree heart block   a. 07/2018 s/p SJM Assurity MRI model PM2271 (Ser# [id number]). . Sleep apnea   "using nasal pilllows since ~ 12/2017" (07/22/2018) . TMJ (dislocation of temporomandibular joint)  Past Surgical History: Past Surgical History: Procedure Laterality Date . APPENDECTOMY   . BREAST CYST ASPIRATION Bilateral  . BREAST CYST EXCISION Right 1978  benign . COLONOSCOPY   . COLONOSCOPY WITH PROPOFOL N/A 07/10/2019  Procedure: COLONOSCOPY WITH PROPOFOL;  Surgeon: Lollie Sails, MD;  Location: [facility] ENDOSCOPY;  Service: Endoscopy;  Laterality: N/A; . ESOPHAGOGASTRODUODENOSCOPY (EGD) WITH PROPOFOL N/A 07/10/2019  Procedure: ESOPHAGOGASTRODUODENOSCOPY (EGD) WITH PROPOFOL;  Surgeon: Lollie Sails, MD;  Location: [facility] ENDOSCOPY;  Service: Endoscopy;  Laterality: N/A; . HYSTEROSCOPY WITH D & C N/A 11/05/2018  Procedure: DILATATION AND CURETTAGE /HYSTEROSCOPY;  Surgeon: Homero Fellers, MD;  Location: [facility] ORS;  Service: Gynecology;  Laterality: N/A; . INSERT / REPLACE / REMOVE PACEMAKER   . PACEMAKER IMPLANT N/A 07/23/2018  SJM Assurity [id number] implanted by Dr Rayann Heman for mobitz II second degree AV block . UTERINE POLYPS REMOVAL   HPI: Pt is an 79 yo female with presumed [place]. She is a former smoker transferred to [facility] from [place] on 6/26 with weakness, numbness, and tingling after being started on Enbrel for psoriasis. Patient developed severe hyponatremia, had a seizure and was transferred to [facility] on 7/03 for further neurologic assessment.  Required intubation for airway protection on 7/3 and waws extubated on 7/11 but has needed biPAP intermittently for respiratory distress. Pt became unresponsive during MRI on 7/6 and had code blue; sustained bilateral pneumothoraces after bag ventilation for which bilateral chest tubes were placed. MRI was negative for acute changes. Patient started on PLEX plasmaphoresis 7/7. 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 Teaspoons of thin, nectar, honey and puree were given. Pt had silent aspiration of thin liquids before the swallow and after. However, across textures use of a supraglottic swallow, swallow x2 and throat clear/expectoration effort after every 2-3 sips aided in early, more effortful airway protection and removal of pharyngeal residue present post swallow. Presence of COrtrak was clearly inhibiting epiglottic deflection, but pt is not ready to rely on oral intake for nutrition and medication at this time given silent nature of aspiration, weakness and muscle fatigue. However, pt is capable of taking teaspoons of thin or nectar thick water or ice after oral care with staff. Pt must be instructed to hold the liquid in her mouth, hold her breath and swallow hard and then swallow again. After every 2-3 sips she should clear her throat and espectorate residue. Pt demosntrated  competency with these  strategies over 5 trials with only one verbal cue. Will f/u for subjective improvement and readiness for re-test.  SLP Visit Diagnosis Dysphagia, pharyngeal phase (R13.13) Attention and concentration deficit following -- Frontal lobe and executive function deficit following -- Impact on safety and function Moderate aspiration risk;Severe aspiration risk   CHL IP TREATMENT RECOMMENDATION 04/15/2020 Treatment Recommendations Therapy as outlined in treatment plan below   Prognosis 04/15/2020 Prognosis for Safe Diet Advancement Good Barriers to Reach Goals -- Barriers/Prognosis Comment -- CHL IP DIET RECOMMENDATION 04/15/2020 SLP Diet Recommendations Ice chips PRN after oral care;Free water protocol after oral care Liquid Administration via Spoon Medication Administration Via alternative means Compensations Effortful swallow;Multiple dry swallows after each bite/sip Postural Changes --   No flowsheet data found.  CHL IP FOLLOW UP RECOMMENDATIONS 04/15/2020 Follow up Recommendations Inpatient Rehab   CHL IP FREQUENCY AND DURATION 04/15/2020 Speech Therapy Frequency (ACUTE ONLY) min 2x/week Treatment Duration 2 weeks      CHL IP ORAL PHASE 04/15/2020 Oral Phase WFL Oral - Pudding Teaspoon -- Oral - Pudding Cup -- Oral - Honey Teaspoon -- Oral - Honey Cup -- Oral - Nectar Teaspoon -- Oral - Nectar Cup -- Oral - Nectar Straw -- Oral - Thin Teaspoon -- Oral - Thin Cup -- Oral - Thin Straw -- Oral - Puree -- Oral - Mech Soft -- Oral - Regular -- Oral - Multi-Consistency -- Oral - Pill -- Oral Phase - Comment --  CHL IP PHARYNGEAL PHASE 04/15/2020 Pharyngeal Phase Impaired Pharyngeal- Pudding Teaspoon -- Pharyngeal -- Pharyngeal- Pudding Cup -- Pharyngeal -- Pharyngeal- Honey Teaspoon Reduced epiglottic inversion;Reduced tongue base retraction;Reduced anterior laryngeal mobility;Reduced pharyngeal peristalsis;Pharyngeal residue - valleculae;Pharyngeal residue - pyriform Pharyngeal -- Pharyngeal- Honey Cup -- Pharyngeal -- Pharyngeal-  Nectar Teaspoon Reduced epiglottic inversion;Reduced tongue base retraction;Reduced anterior laryngeal mobility;Reduced pharyngeal peristalsis;Pharyngeal residue - valleculae;Pharyngeal residue - pyriform;Penetration/Aspiration before swallow;Penetration/Apiration after swallow;Trace aspiration Pharyngeal Material enters airway, passes BELOW cords without attempt by patient to eject out (silent aspiration);Material enters airway, CONTACTS cords and then ejected out Pharyngeal- Nectar Cup -- Pharyngeal -- Pharyngeal- Nectar Straw -- Pharyngeal -- Pharyngeal- Thin Teaspoon Reduced epiglottic inversion;Reduced tongue base retraction;Reduced anterior laryngeal mobility;Reduced pharyngeal peristalsis;Pharyngeal residue - valleculae;Pharyngeal residue - pyriform;Penetration/Aspiration before swallow;Penetration/Apiration after swallow;Trace aspiration Pharyngeal Material enters airway, passes BELOW cords without attempt by patient to eject out (silent aspiration);Material enters airway, CONTACTS cords and not ejected out;Material enters airway, CONTACTS cords and then ejected out Pharyngeal- Thin Cup -- Pharyngeal -- Pharyngeal- Thin Straw -- Pharyngeal -- Pharyngeal- Puree Reduced epiglottic inversion;Reduced tongue base retraction;Reduced anterior laryngeal mobility;Reduced pharyngeal peristalsis;Pharyngeal residue - valleculae;Pharyngeal residue - pyriform Pharyngeal -- Pharyngeal- Mechanical Soft -- Pharyngeal -- Pharyngeal- Regular -- Pharyngeal -- Pharyngeal- Multi-consistency -- Pharyngeal -- Pharyngeal- Pill -- Pharyngeal -- Pharyngeal Comment --  No flowsheet data found. DeBlois, Katherene Ponto 04/15/2020, 1:30 PM                  Medical Problem List and Plan: 1.  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  Antithrombotics: -DVT/anticoagulation:  Pharmaceutical:  Lovenox  -antiplatelet therapy: n/a 3. Pain Management: tylenol prn  -pt with minimal pain at present 4. Mood: patient appears up beat and motivated  -team to provide ego support a needed  -antipsychotic agents: n/a 5. Neuropsych: This patient is capable of making decisions on her own behalf. 6.  Skin/Wound Care:   -Stage II sacral wound   -pressure relief   -local care, maximize nutrition 7. Fluids/Electrolytes/Nutrition: encourage PO  -recent hyponatremia--resolved  -check labs tomorrow 8. Oropharyngeal dysphagia:  -continue NPO with trials with SLP  -TF and freewater via NGT 9. 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 Elevated LFT's  -U/S 7/12 with sludge, borderline hepatic steatosis  -f/u U/S in one year 12. Neurogenic bladder  -discontinue foley in AM  -voiding trial 13. Hx of psoriasis  -holding enbrel as there is concern that this might have been a contributing factor to her syndrome 14. Recent seizures  -resolved  -secondary to hyponatremia         Bary Leriche, PA-C 04/16/2020

## 2020-04-16 NOTE — Progress Notes (Signed)
NAME:  Alison Huffman, MRN:  [id number], DOB:  11-Jan-1941, LOS: 18 ADMISSION DATE:  04/03/2020, CONSULTATION DATE:  04/03/2020 REFERRING MD:  Dr. Hal Hope, [facility], CHIEF COMPLAINT:  Respiratory failure   Brief History   79 yo female former smoker transferred to [facility] from [facility] on 6/26 with weakness, numbness, and tingling after being started on Enbrel for psoriasis. Patient developed severe hyponatremia, had a seizure and was transferred to [facility] on 7/03 for further neurologic assessment.  Required intubation for airway protection and transferred to ICU. 7/06 patient had code blue and sustained bilateral pneumothoraces after bag ventilation for which bilateral chest tubes were placed. Patient started on PLEX 7/7 per neuro recommendation. Patient extubated on 7/11 and weaning HFNC.  She was moved to SDU 7/16 and is continuing to wean O2 support.   Past Medical History  CAD, s/p PM, GERD, HTN, HLD, Psoriasis, Arthritis, OSA  Significant Hospital Events   6/26 Admit to [facility] and seen by neurology 6/28 start IVIG  7/02 seen by nephrology at [facility] >> d/c IVIG due to concern about this causing dilutional hyponatremia 7/03 transfer from [facility]; possible seizure, started  keppra, intubated, transferred to ICU 7/6: unresponsive during MRI, Code Blue called, levophed restarted for 2.5 hours, limited MRI without significant disease findings 7/7: started PLEX treatment for GBS  7/9: Making progress, however still with weak cough, tolerating pressure support ventilation but unable to ventilate adequately with pressure support less than 10 cmH2O, core track tube placed  7/11 NIF -40 good F/VT mechanics less accessory use but now w/ pulm edema 7/15 moved to SDU 7/16 weaning O2  Consults:  Nephrology [facility]) Neurology CIR  Procedures:  ETT 7/03 >> extubated 7/11  Significant Diagnostic Tests:   CT angio head/neck 6/26 >> 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 rate (!) 21, weight 74.6 kg, SpO2 100 %.        Intake/Output Summary (Last 24 hours) at 04/16/2020 1131 Last data filed at 04/16/2020 0500 Gross per 24 hour  Intake 1780 ml  Output 600 ml  Net 1180 ml   Filed Weights   04/13/20 0419 04/15/20 0500 04/16/20 0444  Weight: 73.5 kg 71.6 kg 74.6 kg   Examination: General : Chronically ill, elderly F, reclined in bed NAD HEENT: NCAT pink mmm [place] Pulmonary : Symmetrical chest expansion no accessory use on 3L, RUL rhonchi >L Cardiac: RRR s1s2 no rgm cap refill < 3 Abdomen: soft round ndnt Extremities: no obvious deformity, no cyanosis or clubbing  Neuro : BLE weakness. AAO x4 following commands    Resolved Hospital Problem list   Requirement for vasopressors  Bilateral pneumothoraces status post intubation resolved Hyponatremia  Seizures secondary to hyponatremia Hypernatremia Stridor  Assessment & Plan:   Acute hypoxic respiratory failure with compromised airway 2nd to seizure in setting of hyponatremia and progressive weakness Hx of OSA MSStaph Aureus pulmonary infiltrates with stridor (  resolved stridor) WBC stable around 14. Afebrile overnight.  P - continue ancef to end 7/18   - Wean Supplemental O2 for goal >90% -CPT, IS -Continue encouraging cough   Working diagnosis of atypical GBS, failed IVIG  -Numbness/weakness progressing from  lower extremities up  - s/p PLEX 5/5 P - neurology recs: outpatient follow up and physical therapy -consulted inpt rehab, discharge to inpatient rehab when medically appropriate   -remove iHD cath  Elevated Alk Phos, resolved Alk Pho, AST and ALT all down trending. RUQ ultrasound on 7/12 with gallbladder sludge, borderline hepatic steatosis, recommending repeat RUQ ultrasound in 1 year.   - recommend outpatient follow up    Prediabetes with Hyperglycemia   CBG acceptable range, a1c 6 P - continue  SSI  Chronic HFpEF, Hx of HTN, HLD. - Holding Crestor and Diovan  Mild anemia w/out evidence of bleeding--suspect some degree of either hemodilution or even some hemolysis w/ PLEX -Stable Hgb  Hx of psoriasis. Hold Enbrel, some question as to whether this could have been a contributing factor   Best practice:  Diet: tube feeds DVT prophylaxis: lovenox GI prophylaxis: protonix Mobility: bed rest Code Status: full code Disposition: SDU. To [facility] 7/17. Ultimately, plan for inpatient rehab-- in discussion with rehab team this may occur early next week pending bed availability  Family communication: discussed with pt.  Labs   CBC: Recent Labs  Lab 04/12/20 0552 04/13/20 0421 04/14/20 0518 04/15/20 0404 04/16/20 0124  WBC 14.7* 15.1* 14.0* 13.9* 11.9*  HGB 10.5* 10.1* 10.0* 10.5* 10.1*  HCT 33.3* 32.7* 31.8* 33.6* 31.9*  MCV 94.3 96.2 95.2 96.6 95.5  PLT 279 252 308 343 546    Basic Metabolic Panel: Recent Labs  Lab 04/11/20 0454 04/11/20 0454 04/11/20 1326 04/11/20 2115 04/12/20 1340 04/13/20 0421 04/14/20 0518 04/15/20 0404 04/16/20 0124  NA 142   < > 140   < > 147* 146* 145 144 142  K 3.3*   < > 3.4*   < > 3.9 3.8 4.1 3.8 3.8  CL 111   < > 105   < > 107 112* 110 110 104  CO2 24   < > 25   < > _0 GLUCOSE 144*   < > 135*   < > 113* 183* 212* 108* 119*  BUN 14   < > 13   < > 17 24* _1 CREATININE 0.31*   < > 0.38*   < > 0.46 0.45 0.40* 0.33*  0.35*  CALCIUM 8.9   < > 9.0   < > 9.1 9.1 8.8* 8.7* 9.0  MG 2.0  --  1.9  --   --   --   --   --   --    < > = values in this interval not displayed.   GFR: Estimated Creatinine Clearance: 57.9 mL/min (A) (by C-G formula based on SCr of 0.35 mg/dL (L)). Recent Labs  Lab 04/13/20 0421 04/14/20 0518 04/15/20 0404 04/16/20 0124  WBC 15.1* 14.0* 13.9* 11.9*    Liver Function Tests: Recent Labs  Lab 04/11/20 0454 04/12/20 0552 04/13/20 0421 04/14/20 0518  AST 53* 48* 60* 37  ALT 52* 62* 54* 58*  ALKPHOS 153* 150* 71 72  BILITOT 0.9 1.0 1.1 0.6  PROT 5.5* 6.3* 5.4* 5.5*  ALBUMIN 3.4* 3.6 3.8 3.5   CBG: Recent Labs  Lab 04/15/20 1639 04/15/20 1936 04/15/20 2306 04/16/20 0329 04/16/20 0716  GLUCAP 76 100*  [place] MSN, AGACNP-BC [place] [phone] If no answer, [phone] 04/16/2020, 11:31 AM

## 2020-04-17 DIAGNOSIS — R748 Abnormal levels of other serum enzymes: Secondary | ICD-10-CM

## 2020-04-17 LAB — GLUCOSE, CAPILLARY
Glucose-Capillary: 125 mg/dL — ABNORMAL HIGH (ref 70–99)
Glucose-Capillary: 134 mg/dL — ABNORMAL HIGH (ref 70–99)
Glucose-Capillary: 119 mg/dL — ABNORMAL HIGH (ref 70–99)
Glucose-Capillary: 131 mg/dL — ABNORMAL HIGH (ref 70–99)

## 2020-04-17 LAB — BASIC METABOLIC PANEL
Anion gap: 10 (ref 5–15)
BUN: 11 mg/dL (ref 8–23)
CO2: 25 mmol/L (ref 22–32)
Calcium: 8.9 mg/dL (ref 8.9–10.3)
Chloride: 102 mmol/L (ref 98–111)
Creatinine, Ser: 0.37 mg/dL — ABNORMAL LOW (ref 0.44–1.00)
GFR calc Af Amer: >60 mL/min (ref >60)
GFR calc non Af Amer: >60 mL/min (ref >60)
Glucose, Bld: 136 mg/dL — ABNORMAL HIGH (ref 70–99)
Potassium: 4.5 mmol/L (ref 3.5–5.1)
Sodium: 137 mmol/L (ref 135–145)

## 2020-04-17 MED ORDER — POLYETHYLENE GLYCOL 3350 17 G PO PACK: 17.0000 g | PACK | Freq: Every day | ORAL | Status: DC | PRN

## 2020-04-17 NOTE — Progress Notes (Signed)
Inpatient Rehab Admissions Coordinator:   Spoke with patient at bedside and discussed potential admit to CIR tomorrow. She is on board with this plan and MD and RN feel patient is stable and appropriate for d/c. Will tentatively plan on admitting Pt. To CIR tomorrow morning.   Clemens Catholic, [place], [place] Admissions Coordinator  [phone] ([place]) [phone] (office)

## 2020-04-17 NOTE — Progress Notes (Addendum)
Progress Note    Alison Huffman  DYN:[id number] DOB: 26-Apr-1941  DOA: 04/03/2020 PCP: Leone Haven, MD    Brief Narrative:     Medical records reviewed and are as summarized below:  79 yo female former smoker transferred to [facility] from [facility] [place] on 6/26 with weakness, numbness, and tingling after being started on Enbrel for psoriasis. Patient developed severe hyponatremia, had a seizure and was transferred to [facility] on 7/03 for further neurologic assessment.  Required intubation for airway protection and transferred to ICU. 7/06 patient had code blue and sustained bilateral pneumothoraces after bag ventilation for which bilateral chest tubes were placed. Patient started on PLEX 7/7 per neuro recommendation. Patient extubated on 7/11 and weaning HFNC.  Tx from PCCM to [facility] on 7/17.  Assessment/Plan:   Active Problems:   Acute on chronic respiratory failure with hypoxemia (HCC)   Weakness   Pressure injury of skin   Guillain-Barre (HCC)   Respiratory failure (HCC)   Benign essential HTN   Chronic diastolic congestive heart failure (HCC)   Coronary artery disease involving native coronary artery of native heart without angina pectoris    Acute hypoxic respiratory failure with compromised airway 2nd to seizure in setting of hyponatremia and progressive weakness -extubated and O2 being weaned down-- currently on 2L [place]  MSStaph Aureus pulmonary infiltrates with stridor (resolved stridor) -ancef through 7/18 -WBCs trending down  Hypernatremia, resolved -  fwf 54m q4 - follow daily BMP     Numbness/weakness progressing from lower extremities up with a working diagnosis of atypical GBS, failed IVIG -s/p PLEX 5/5 -neurology consult appreciated -SLP following for possible diet order, continue tube feeds until then -CIR bed pending  Elevated Alk Phos, resolved Alk Pho, AST and ALT all down trending. RUQ ultrasound on 7/12 with gallbladder  sludge, borderline hepatic steatosis, recommending repeat RUQ ultrasound in 1 year.   - recommend outpatient follow up    Urinary retention:  Keep Foley -voiding trial when more mobile  Hx of psoriasis. Hold Enbrel, some question as to whether this could have been a contributing factor  Pressure Injury 04/03/20 Sacrum Stage 2 -  Partial thickness loss of dermis presenting as a shallow open injury with a red, pink wound bed without slough. (Active)  04/03/20 0300  Location: Sacrum  Location Orientation:   Staging: Stage 2 -  Partial thickness loss of dermis presenting as a shallow open injury with a red, pink wound bed without slough.  Wound Description (Comments):   Present on Admission: Yes      Resolved Hospital Problem list   Requirement for vasopressors  Bilateral pneumothoraces status post intubation resolved Hyponatremia  Seizures secondary to hyponatremia  Family Communication/Anticipated D/C date and plan/Code Status   DVT prophylaxis: Lovenox ordered. Code Status: Full Code.  Disposition Plan: Status is: Inpatient  Remains inpatient appropriate because:IV treatments appropriate due to intensity of illness or inability to take PO   Dispo: The patient is from: Home              Anticipated d/c is to: CIR              Anticipated d/c date is: 2 days              Patient currently is medically stable to d/c. to CIR if they can continue to work with her tube feeds/SLP swallowing evals         Medical Consultants:  PCCM  Neuro  Subjective:   Had BM today  Objective:    Vitals:   04/17/20 0600 04/17/20 0700 04/17/20 0732 04/17/20 0843  BP:   121/68   Pulse: 84 90 84   Resp: _0 Temp:   (!) 97.4 F (36.3 C)   TempSrc:   Oral   SpO2: 98% 98% 99%   Weight:       No intake or output data in the 24 hours ending 04/17/20 0957 Filed Weights   04/15/20 0500 04/16/20 0444 04/17/20 0349  Weight: 71.6 kg 74.6 kg 78.4 kg     Exam:  General: Appearance:     Overweight female with wet sounding secretions  GI: +BS, mild distention  Lungs:     On O2 [place], wet sounding voice, weak cough  Heart:    Normal heart rate. Normal rhythm. No murmurs, rubs, or gallops.   MS:   All extremities are intact.   Neurologic:   Awake, alert, oriented x 3. Moves all 4 ext    Data Reviewed:   I have personally reviewed following labs and imaging studies:  Labs: Labs show the following:   Basic Metabolic Panel: Recent Labs  Lab 04/11/20 0454 04/11/20 0454 04/11/20 1326 04/11/20 2115 04/12/20 1340 04/12/20 1340 04/13/20 0421 04/13/20 0421 04/14/20 0518 04/14/20 0518 04/15/20 0404 04/16/20 0124  NA 142   < > 140   < > 147*  --  146*  --  145  --  144 142  K 3.3*   < > 3.4*   < > 3.9   < > 3.8   < > 4.1   < > 3.8 3.8  CL 111   < > 105   < > 107  --  112*  --  110  --  110 104  CO2 24   < > 25   < > 27  --  25  --  27  --  26 29  GLUCOSE 144*   < > 135*   < > 113*  --  183*  --  212*  --  108* 119*  BUN 14   < > 13   < > 17  --  24*  --  20  --  16 11  CREATININE 0.31*   < > 0.38*   < > 0.46  --  0.45  --  0.40*  --  0.33* 0.35*  CALCIUM 8.9   < > 9.0   < > 9.1  --  9.1  --  8.8*  --  8.7* 9.0  MG 2.0  --  1.9  --   --   --   --   --   --   --   --   --    < > = values in this interval not displayed.   GFR Estimated Creatinine Clearance: 59.2 mL/min (A) (by C-G formula based on SCr of 0.35 mg/dL (L)). Liver Function Tests: Recent Labs  Lab 04/11/20 0454 04/12/20 0552 04/13/20 0421 04/14/20 0518  AST 53* 48* 60* 37  ALT 52* 62* 54* 58*  ALKPHOS 153* 150* 71 72  BILITOT 0.9 1.0 1.1 0.6  PROT 5.5* 6.3* 5.4* 5.5*  ALBUMIN 3.4* 3.6 3.8 3.5   No results for input(s): LIPASE, AMYLASE in the last 168 hours. No results for input(s): AMMONIA in the last 168 hours. Coagulation profile No results for input(s): INR, PROTIME in the last 168 hours.  CBC: Recent Labs  Lab 04/12/20 0552 04/13/20 0421  04/14/20 0518 04/15/20 0404 04/16/20 0124  WBC 14.7* 15.1* 14.0* 13.9* 11.9*  HGB 10.5* 10.1* 10.0* 10.5* 10.1*  HCT 33.3* 32.7* 31.8* 33.6* 31.9*  MCV 94.3 96.2 95.2 96.6 95.5  PLT 279 252 308 343 335   Cardiac Enzymes: No results for input(s): CKTOTAL, CKMB, CKMBINDEX, TROPONINI in the last 168 hours. BNP (last 3 results) No results for input(s): PROBNP in the last 8760 hours. CBG: Recent Labs  Lab 04/16/20 1612 04/16/20 1943 04/16/20 2346 04/17/20 0348 04/17/20 0824  GLUCAP 123* 116* 149* 125* 134*   D-Dimer: No results for input(s): DDIMER in the last 72 hours. Hgb A1c: No results for input(s): HGBA1C in the last 72 hours. Lipid Profile: No results for input(s): CHOL, HDL, LDLCALC, TRIG, CHOLHDL, LDLDIRECT in the last 72 hours. Thyroid function studies: No results for input(s): TSH, T4TOTAL, T3FREE, THYROIDAB in the last 72 hours.  Invalid input(s): FREET3 Anemia work up: No results for input(s): VITAMINB12, FOLATE, FERRITIN, TIBC, IRON, RETICCTPCT in the last 72 hours. Sepsis Labs: Recent Labs  Lab 04/13/20 0421 04/14/20 0518 04/15/20 0404 04/16/20 0124  WBC 15.1* 14.0* 13.9* 11.9*    Microbiology Recent Results (from the past 240 hour(s))  Culture, respiratory (non-expectorated)     Status: None   Collection Time: 04/11/20  9:15 AM   Specimen: Tracheal Aspirate; Respiratory  Result Value Ref Range Status   Specimen Description TRACHEAL ASPIRATE  Final   Special Requests NONE  Final   Gram Stain   Final    RARE WBC PRESENT,BOTH PMN AND MONONUCLEAR FEW GRAM POSITIVE COCCI IN PAIRS Performed at [facility], [address] [place], [place] [address]    Culture MODERATE STAPHYLOCOCCUS AUREUS  Final   Report Status 04/13/2020 FINAL  Final   Organism ID, Bacteria STAPHYLOCOCCUS AUREUS  Final      Susceptibility   Staphylococcus aureus - MIC*    CIPROFLOXACIN <=0.5 SENSITIVE Sensitive     ERYTHROMYCIN >=8 RESISTANT Resistant     GENTAMICIN <=0.5  SENSITIVE Sensitive     OXACILLIN 0.5 SENSITIVE Sensitive     TETRACYCLINE <=1 SENSITIVE Sensitive     VANCOMYCIN <=0.5 SENSITIVE Sensitive     TRIMETH/SULFA <=10 SENSITIVE Sensitive     CLINDAMYCIN <=0.25 SENSITIVE Sensitive     RIFAMPIN <=0.5 SENSITIVE Sensitive     Inducible Clindamycin NEGATIVE Sensitive     * MODERATE STAPHYLOCOCCUS AUREUS    Procedures and diagnostic studies:  DG CHEST PORT 1 VIEW  Result Date: 04/16/2020 CLINICAL DATA:  79 year old female with history of shortness of breath. EXAM: PORTABLE CHEST 1 VIEW COMPARISON:  Chest x-ray 04/12/2020. FINDINGS: A feeding tube is seen extending into the abdomen, however, the tip of the feeding tube extends below the lower margin of the image. Right internal jugular central venous catheter with tip terminating in the mid superior vena cava. Left-sided pacemaker device in place with lead tips projecting over the expected location of the right atrium and right ventricle. Lung volumes are low, but have increased compared to the prior study. There continues to be some bibasilar opacities (left greater than right), improved compared to the prior study, compatible with resolving areas of atelectasis and/or consolidation in the lower lobes. No definite pleural effusions. No evidence of pulmonary edema. No pneumothorax. Heart size is normal. Upper mediastinal contours are within normal limits. Aortic atherosclerosis. IMPRESSION: 1. Support apparatus, as above. 2. Persistently low but increasing lung volumes with improving bibasilar areas of atelectasis and/or  consolidation (left greater than right). 3. Aortic atherosclerosis. Electronically Signed   By: Vinnie Langton M.D.   On: 04/16/2020 06:27   DG Swallowing Func-Speech Pathology  Result Date: 04/15/2020 Objective Swallowing Evaluation: Type of Study: MBS-Modified Barium Swallow Study  Patient Details Name: Alison Huffman MRN: [id number] Date of Birth: [birth year] Today's Date: 04/15/2020 Time:  SLP Start Time (ACUTE ONLY): 1030 -SLP Stop Time (ACUTE ONLY): 1050 SLP Time Calculation (min) (ACUTE ONLY): 20 min Past Medical History: Past Medical History: Diagnosis Date . Ankle pain, right  . Atypical chest pain   a. 10/2018 MV: EF 57%. No ischemia/infarct. . Coronary artery disease  . Diastolic dysfunction   a. 04/2018 Echo: EF 55-60%, no rwma, Gr1 DD. Triv AI, mild MR. Mod dil LA. Nl RV fxn. PASP nl. . GERD (gastroesophageal reflux disease)  . Headache   "dull one sometimes daily, at least weekly in last couple months" (07/22/2018) . Hyperlipidemia  . Hypertension  . 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 Joint pain in fingers of right hand  . Lichen sclerosus  . Osteopenia  . Pneumonia 2012? X 1 . 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 Psoriasis  . Psoriatic arthritis ([place])   " dx'd the 1st of this year, 2019" (07/22/2018) . Psoriatic arthritis ([place])  . Rosacea  . Seborrheic keratosis  . Second degree heart block   a. 07/2018 s/p SJM Assurity MRI model PM2271 (Ser# [id number]). . Sleep apnea   "using nasal pilllows since ~ 12/2017" (07/22/2018) . TMJ (dislocation of temporomandibular joint)  Past Surgical History: Past Surgical History: Procedure Laterality Date . APPENDECTOMY   . BREAST CYST ASPIRATION Bilateral  . BREAST CYST EXCISION Right 1978  benign . COLONOSCOPY   . COLONOSCOPY WITH PROPOFOL N/A 07/10/2019  Procedure: COLONOSCOPY WITH PROPOFOL;  Surgeon: Lollie Sails, MD;  Location[facility] ENDOSCOPY;  Service: Endoscopy;  Laterality: N/A; . ESOPHAGOGASTRODUODENOSCOPY (EGD) WITH PROPOFOL N/A 07/10/2019  Procedure: ESOPHAGOGASTRODUODENOSCOPY (EGD) WITH PROPOFOL;  Surgeon: Lollie Sails, MD;  Location: [facility] ENDOSCOPY;  Service: Endoscopy;  Laterality: N/A; . HYSTEROSCOPY WITH D & C N/A 11/05/2018  Procedure: DILATATION AND CURETTAGE /HYSTEROSCOPY;  Surgeon: Homero Fellers, MD;  Location: [facility] ORS;  Service: Gynecology;  Laterality: N/A; . INSERT / REPLACE / REMOVE PACEMAKER   . PACEMAKER  IMPLANT N/A 07/23/2018  SJM Assurity [id number] implanted by Dr Rayann Heman for mobitz II second degree AV block . UTERINE POLYPS REMOVAL   HPI: Pt is an 79 yo female with presumed [place]. She is a former smoker transferred to [facility] from [place] on 6/26 with weakness, numbness, and tingling after being started on Enbrel for psoriasis. Patient developed severe hyponatremia, had a seizure and was transferred to [facility] on 7/03 for further neurologic assessment.  Required intubation for airway protection on 7/3 and waws extubated on 7/11 but has needed biPAP intermittently for respiratory distress. Pt became unresponsive during MRI on 7/6 and had code blue; sustained bilateral pneumothoraces after bag ventilation for which bilateral chest tubes were placed. MRI was negative for acute changes. Patient started on PLEX plasmaphoresis 7/7. 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 Teaspoons of thin, nectar, honey and puree were given. Pt had silent aspiration of thin  liquids before the swallow and after. However, across textures use of a supraglottic swallow, swallow x2 and throat clear/expectoration effort after every 2-3 sips aided in early, more effortful airway protection and removal of pharyngeal residue present post swallow. Presence of COrtrak was clearly inhibiting epiglottic deflection, but pt is not ready to rely on oral intake for nutrition and medication at this time given silent nature of aspiration, weakness and muscle fatigue. However, pt is capable of taking teaspoons of thin or nectar thick water or ice after oral  care with staff. Pt must be instructed to hold the liquid in her mouth, hold her breath and swallow hard and then swallow again. After every 2-3 sips she should clear her throat and espectorate residue. Pt demosntrated competency with these strategies over 5 trials with only one verbal cue. Will f/u for subjective improvement and readiness for re-test.  SLP Visit Diagnosis Dysphagia, pharyngeal phase (R13.13) Attention and concentration deficit following -- Frontal lobe and executive function deficit following -- Impact on safety and function Moderate aspiration risk;Severe aspiration risk   CHL IP TREATMENT RECOMMENDATION 04/15/2020 Treatment Recommendations Therapy as outlined in treatment plan below   Prognosis 04/15/2020 Prognosis for Safe Diet Advancement Good Barriers to Reach Goals -- Barriers/Prognosis Comment -- CHL IP DIET RECOMMENDATION 04/15/2020 SLP Diet Recommendations Ice chips PRN after oral care;Free water protocol after oral care Liquid Administration via Spoon Medication Administration Via alternative means Compensations Effortful swallow;Multiple dry swallows after each bite/sip Postural Changes --   No flowsheet data found.  CHL IP FOLLOW UP RECOMMENDATIONS 04/15/2020 Follow up Recommendations Inpatient Rehab   CHL IP FREQUENCY AND DURATION 04/15/2020 Speech Therapy Frequency (ACUTE ONLY) min 2x/week Treatment Duration 2 weeks      CHL IP ORAL PHASE 04/15/2020 Oral Phase WFL Oral - Pudding Teaspoon -- Oral - Pudding Cup -- Oral - Honey Teaspoon -- Oral - Honey Cup -- Oral - Nectar Teaspoon -- Oral - Nectar Cup -- Oral - Nectar Straw -- Oral - Thin Teaspoon -- Oral - Thin Cup -- Oral - Thin Straw -- Oral - Puree -- Oral - Mech Soft -- Oral - Regular -- Oral - Multi-Consistency -- Oral - Pill -- Oral Phase - Comment --  CHL IP PHARYNGEAL PHASE 04/15/2020 Pharyngeal Phase Impaired Pharyngeal- Pudding Teaspoon -- Pharyngeal -- Pharyngeal- Pudding Cup -- Pharyngeal -- Pharyngeal- Honey Teaspoon Reduced  epiglottic inversion;Reduced tongue base retraction;Reduced anterior laryngeal mobility;Reduced pharyngeal peristalsis;Pharyngeal residue - valleculae;Pharyngeal residue - pyriform Pharyngeal -- Pharyngeal- Honey Cup -- Pharyngeal -- Pharyngeal- Nectar Teaspoon Reduced epiglottic inversion;Reduced tongue base retraction;Reduced anterior laryngeal mobility;Reduced pharyngeal peristalsis;Pharyngeal residue - valleculae;Pharyngeal residue - pyriform;Penetration/Aspiration before swallow;Penetration/Apiration after swallow;Trace aspiration Pharyngeal Material enters airway, passes BELOW cords without attempt by patient to eject out (silent aspiration);Material enters airway, CONTACTS cords and then ejected out Pharyngeal- Nectar Cup -- Pharyngeal -- Pharyngeal- Nectar Straw -- Pharyngeal -- Pharyngeal- Thin Teaspoon Reduced epiglottic inversion;Reduced tongue base retraction;Reduced anterior laryngeal mobility;Reduced pharyngeal peristalsis;Pharyngeal residue - valleculae;Pharyngeal residue - pyriform;Penetration/Aspiration before swallow;Penetration/Apiration after swallow;Trace aspiration Pharyngeal Material enters airway, passes BELOW cords without attempt by patient to eject out (silent aspiration);Material enters airway, CONTACTS cords and not ejected out;Material enters airway, CONTACTS cords and then ejected out Pharyngeal- Thin Cup -- Pharyngeal -- Pharyngeal- Thin Straw -- Pharyngeal -- Pharyngeal- Puree Reduced epiglottic inversion;Reduced tongue base retraction;Reduced anterior laryngeal mobility;Reduced pharyngeal peristalsis;Pharyngeal residue - valleculae;Pharyngeal residue - pyriform Pharyngeal -- Pharyngeal- Mechanical Soft -- Pharyngeal -- Pharyngeal- Regular -- Pharyngeal -- Pharyngeal- Multi-consistency -- Pharyngeal -- Pharyngeal- Pill --  Pharyngeal -- Pharyngeal Comment --  No flowsheet data found. DeBlois, Katherene Ponto 04/15/2020, 1:30 PM               Medications:   . chlorhexidine  15 mL  Mouth Rinse BID  . Chlorhexidine Gluconate Cloth  6 each Topical Daily  . docusate  100 mg Per Tube BID  . enoxaparin  40 mg Subcutaneous Daily  . free water  30 mL Per Tube Q4H  . heparin sodium (porcine)  1,000 Units Intracatheter Once  . insulin aspart  0-15 Units Subcutaneous Q4H  . mouth rinse  15 mL Mouth Rinse q12n4p  . pantoprazole sodium  40 mg Per Tube Daily  . polyethylene glycol  17 g Oral Daily  . sodium chloride flush  10-40 mL Intracatheter Q12H  . sodium chloride flush  10-40 mL Intracatheter Q12H   Continuous Infusions: .  ceFAZolin (ANCEF) IV 2 g (04/17/20 0420)  . citrate dextrose    . feeding supplement (OSMOLITE 1.2 CAL) 1,000 mL (04/17/20 0020)     LOS: 14 days   Geradine Girt  [facility]   How to contact the [facility] Attending or Consulting provider [place] or covering provider during after hours [place], for this patient?  1. Check the care team in [facility] and look for a) attending/consulting TRH provider listed and b) the [facility] team listed 2. Log into [email or web address] and use [place]'s universal password to access. If you do not have the password, please contact the hospital operator. 3. Locate the [facility] provider you are looking for under [facility] and page to a number that you can be directly reached. 4. If you still have difficulty reaching the provider, please page the [facility] (Director on Call) for the Hospitalists listed on amion for assistance.  04/17/2020, 9:57 AM

## 2020-04-17 NOTE — Progress Notes (Signed)
Patient asleep. No distress. Will attempt CPT Flutter at a later time.

## 2020-04-18 ENCOUNTER — Inpatient Hospital Stay (HOSPITAL_COMMUNITY)
Admission: RE | Admit: 2020-04-18 | Payer: Medicare Other | Source: Intra-hospital | Admitting: Physical Medicine & Rehabilitation

## 2020-04-18 ENCOUNTER — Other Ambulatory Visit: Payer: Self-pay

## 2020-04-18 ENCOUNTER — Inpatient Hospital Stay (HOSPITAL_COMMUNITY)
Admission: RE | Admit: 2020-04-18 | Discharge: 2020-05-05 | DRG: 094 | Disposition: A | Discharge status: Skilled Nursing Facility | Payer: Medicare Other | Source: Ambulatory Visit | Attending: Physical Medicine and Rehabilitation | Admitting: Physical Medicine and Rehabilitation

## 2020-04-18 ENCOUNTER — Inpatient Hospital Stay (HOSPITAL_COMMUNITY)
Admission: RE | Admit: 2020-04-18 | Payer: Medicare Other | Source: Home / Self Care | Admitting: Physical Medicine & Rehabilitation

## 2020-04-18 ENCOUNTER — Encounter (HOSPITAL_COMMUNITY): Payer: Self-pay | Admitting: Physical Medicine & Rehabilitation

## 2020-04-18 DIAGNOSIS — G610 Guillain-Barre syndrome: Principal | ICD-10-CM | POA: Diagnosis present

## 2020-04-18 DIAGNOSIS — G619 Inflammatory polyneuropathy, unspecified: Secondary | ICD-10-CM | POA: Diagnosis present

## 2020-04-18 DIAGNOSIS — Z20822 Contact with and (suspected) exposure to covid-19: Secondary | ICD-10-CM | POA: Diagnosis present

## 2020-04-18 DIAGNOSIS — E785 Hyperlipidemia, unspecified: Secondary | ICD-10-CM | POA: Diagnosis present

## 2020-04-18 DIAGNOSIS — Z9989 Dependence on other enabling machines and devices: Secondary | ICD-10-CM | POA: Diagnosis not present

## 2020-04-18 DIAGNOSIS — L8992 Pressure ulcer of unspecified site, stage 2: Secondary | ICD-10-CM | POA: Diagnosis present

## 2020-04-18 DIAGNOSIS — Z950 Presence of cardiac pacemaker: Secondary | ICD-10-CM | POA: Diagnosis not present

## 2020-04-18 DIAGNOSIS — Z79899 Other long term (current) drug therapy: Secondary | ICD-10-CM | POA: Diagnosis not present

## 2020-04-18 DIAGNOSIS — B9561 Methicillin susceptible Staphylococcus aureus infection as the cause of diseases classified elsewhere: Secondary | ICD-10-CM | POA: Diagnosis present

## 2020-04-18 DIAGNOSIS — M7522 Bicipital tendinitis, left shoulder: Secondary | ICD-10-CM | POA: Diagnosis present

## 2020-04-18 DIAGNOSIS — J9601 Acute respiratory failure with hypoxia: Secondary | ICD-10-CM | POA: Diagnosis present

## 2020-04-18 DIAGNOSIS — R4189 Other symptoms and signs involving cognitive functions and awareness: Secondary | ICD-10-CM | POA: Diagnosis present

## 2020-04-18 DIAGNOSIS — Z6824 Body mass index (BMI) 24.0-24.9, adult: Secondary | ICD-10-CM

## 2020-04-18 DIAGNOSIS — D72829 Elevated white blood cell count, unspecified: Secondary | ICD-10-CM

## 2020-04-18 DIAGNOSIS — L409 Psoriasis, unspecified: Secondary | ICD-10-CM | POA: Diagnosis present

## 2020-04-18 DIAGNOSIS — L4052 Psoriatic arthritis mutilans: Secondary | ICD-10-CM | POA: Diagnosis not present

## 2020-04-18 DIAGNOSIS — R279 Unspecified lack of coordination: Secondary | ICD-10-CM | POA: Diagnosis not present

## 2020-04-18 DIAGNOSIS — L89152 Pressure ulcer of sacral region, stage 2: Secondary | ICD-10-CM | POA: Diagnosis present

## 2020-04-18 DIAGNOSIS — R1312 Dysphagia, oropharyngeal phase: Secondary | ICD-10-CM

## 2020-04-18 DIAGNOSIS — N319 Neuromuscular dysfunction of bladder, unspecified: Secondary | ICD-10-CM

## 2020-04-18 DIAGNOSIS — M6281 Muscle weakness (generalized): Secondary | ICD-10-CM | POA: Diagnosis not present

## 2020-04-18 DIAGNOSIS — E876 Hypokalemia: Secondary | ICD-10-CM | POA: Diagnosis present

## 2020-04-18 DIAGNOSIS — D62 Acute posthemorrhagic anemia: Secondary | ICD-10-CM | POA: Diagnosis present

## 2020-04-18 DIAGNOSIS — R490 Dysphonia: Secondary | ICD-10-CM | POA: Diagnosis present

## 2020-04-18 DIAGNOSIS — R159 Full incontinence of feces: Secondary | ICD-10-CM

## 2020-04-18 DIAGNOSIS — M810 Age-related osteoporosis without current pathological fracture: Secondary | ICD-10-CM | POA: Diagnosis not present

## 2020-04-18 DIAGNOSIS — E440 Moderate protein-calorie malnutrition: Secondary | ICD-10-CM | POA: Insufficient documentation

## 2020-04-18 DIAGNOSIS — R54 Age-related physical debility: Secondary | ICD-10-CM | POA: Diagnosis present

## 2020-04-18 DIAGNOSIS — I10 Essential (primary) hypertension: Secondary | ICD-10-CM | POA: Diagnosis present

## 2020-04-18 DIAGNOSIS — M7521 Bicipital tendinitis, right shoulder: Secondary | ICD-10-CM | POA: Diagnosis present

## 2020-04-18 DIAGNOSIS — G4733 Obstructive sleep apnea (adult) (pediatric): Secondary | ICD-10-CM | POA: Diagnosis present

## 2020-04-18 DIAGNOSIS — Z87891 Personal history of nicotine dependence: Secondary | ICD-10-CM

## 2020-04-18 DIAGNOSIS — K219 Gastro-esophageal reflux disease without esophagitis: Secondary | ICD-10-CM | POA: Diagnosis present

## 2020-04-18 DIAGNOSIS — B370 Candidal stomatitis: Secondary | ICD-10-CM | POA: Diagnosis present

## 2020-04-18 DIAGNOSIS — Z888 Allergy status to other drugs, medicaments and biological substances status: Secondary | ICD-10-CM

## 2020-04-18 DIAGNOSIS — E871 Hypo-osmolality and hyponatremia: Secondary | ICD-10-CM | POA: Diagnosis present

## 2020-04-18 DIAGNOSIS — R2689 Other abnormalities of gait and mobility: Secondary | ICD-10-CM | POA: Diagnosis not present

## 2020-04-18 DIAGNOSIS — K5900 Constipation, unspecified: Secondary | ICD-10-CM | POA: Diagnosis present

## 2020-04-18 DIAGNOSIS — K760 Fatty (change of) liver, not elsewhere classified: Secondary | ICD-10-CM | POA: Diagnosis present

## 2020-04-18 DIAGNOSIS — G6289 Other specified polyneuropathies: Secondary | ICD-10-CM

## 2020-04-18 DIAGNOSIS — R471 Dysarthria and anarthria: Secondary | ICD-10-CM | POA: Diagnosis present

## 2020-04-18 DIAGNOSIS — I441 Atrioventricular block, second degree: Secondary | ICD-10-CM | POA: Diagnosis present

## 2020-04-18 DIAGNOSIS — L4050 Arthropathic psoriasis, unspecified: Secondary | ICD-10-CM | POA: Diagnosis present

## 2020-04-18 DIAGNOSIS — I2510 Atherosclerotic heart disease of native coronary artery without angina pectoris: Secondary | ICD-10-CM | POA: Diagnosis present

## 2020-04-18 DIAGNOSIS — R339 Retention of urine, unspecified: Secondary | ICD-10-CM | POA: Diagnosis present

## 2020-04-18 DIAGNOSIS — G8250 Quadriplegia, unspecified: Secondary | ICD-10-CM | POA: Diagnosis present

## 2020-04-18 DIAGNOSIS — K2100 Gastro-esophageal reflux disease with esophagitis, without bleeding: Secondary | ICD-10-CM | POA: Diagnosis not present

## 2020-04-18 LAB — GLUCOSE, CAPILLARY
Glucose-Capillary: 114 mg/dL — ABNORMAL HIGH (ref 70–99)
Glucose-Capillary: 114 mg/dL — ABNORMAL HIGH (ref 70–99)
Glucose-Capillary: 120 mg/dL — ABNORMAL HIGH (ref 70–99)
Glucose-Capillary: 124 mg/dL — ABNORMAL HIGH (ref 70–99)
Glucose-Capillary: 133 mg/dL — ABNORMAL HIGH (ref 70–99)
Glucose-Capillary: 134 mg/dL — ABNORMAL HIGH (ref 70–99)

## 2020-04-18 LAB — CBC
HCT: 34.9 % — ABNORMAL LOW (ref 36.0–46.0)
Hemoglobin: 11 g/dL — ABNORMAL LOW (ref 12.0–15.0)
MCH: 29.5 pg (ref 26.0–34.0)
MCHC: 31.5 g/dL (ref 30.0–36.0)
MCV: 93.6 fL (ref 80.0–100.0)
Platelets: 386 10*3/uL (ref 150–400)
RBC: 3.73 MIL/uL — ABNORMAL LOW (ref 3.87–5.11)
RDW: 13.8 % (ref 11.5–15.5)
WBC: 11.2 10*3/uL — ABNORMAL HIGH (ref 4.0–10.5)
nRBC: 0 % (ref 0.0–0.2)

## 2020-04-18 LAB — BASIC METABOLIC PANEL
Anion gap: 9 (ref 5–15)
BUN: 11 mg/dL (ref 8–23)
CO2: 25 mmol/L (ref 22–32)
Calcium: 9.2 mg/dL (ref 8.9–10.3)
Chloride: 104 mmol/L (ref 98–111)
Creatinine, Ser: 0.32 mg/dL — ABNORMAL LOW (ref 0.44–1.00)
GFR calc Af Amer: >60 mL/min (ref >60)
GFR calc non Af Amer: >60 mL/min (ref >60)
Glucose, Bld: 114 mg/dL — ABNORMAL HIGH (ref 70–99)
Potassium: 4.6 mmol/L (ref 3.5–5.1)
Sodium: 138 mmol/L (ref 135–145)

## 2020-04-18 MED ORDER — PANTOPRAZOLE SODIUM 40 MG PO PACK
40.0000 mg | PACK | Freq: Every day | ORAL | Status: DC
  Administered 2020-04-19 – 2020-04-29 (×11): 40 mg
  Filled 2020-04-18 (×12): qty 20

## 2020-04-18 MED ORDER — ACETAMINOPHEN 325 MG PO TABS: 650.0000 mg | ORAL_TABLET | ORAL | Status: DC | PRN

## 2020-04-18 MED ORDER — CHLORHEXIDINE GLUCONATE 0.12 % MT SOLN: 15.0000 mL | Freq: Two times a day (BID) | OROMUCOSAL | 0 refills | Status: DC

## 2020-04-18 MED ORDER — ENOXAPARIN SODIUM 40 MG/0.4ML ~~LOC~~ SOLN
40.0000 mg | SUBCUTANEOUS | Status: DC
  Administered 2020-04-19 – 2020-05-04 (×16): 40 mg via SUBCUTANEOUS
  Filled 2020-04-18 (×16): qty 0.4

## 2020-04-18 MED ORDER — FREE WATER
30.0000 mL | Status: DC
  Administered 2020-04-18 – 2020-04-22 (×22): 30 mL

## 2020-04-18 MED ORDER — GUAIFENESIN 100 MG/5ML PO SOLN: 5.0000 mL | ORAL | 0 refills | Status: DC | PRN

## 2020-04-18 MED ORDER — INSULIN ASPART 100 UNIT/ML ~~LOC~~ SOLN
0.0000 [IU] | SUBCUTANEOUS | Status: DC
  Administered 2020-04-18 – 2020-04-21 (×9): 2 [IU] via SUBCUTANEOUS
  Administered 2020-04-21: 3 [IU] via SUBCUTANEOUS

## 2020-04-18 MED ORDER — PROCHLORPERAZINE MALEATE 5 MG PO TABS: 5.0000 mg | ORAL_TABLET | Freq: Four times a day (QID) | ORAL | Status: DC | PRN

## 2020-04-18 MED ORDER — GUAIFENESIN-DM 100-10 MG/5ML PO SYRP: 5.0000 mL | ORAL_SOLUTION | Freq: Four times a day (QID) | ORAL | Status: DC | PRN

## 2020-04-18 MED ORDER — OSMOLITE 1.2 CAL PO LIQD
1000.0000 mL | ORAL | Status: DC
  Administered 2020-04-18: 1000 mL
  Filled 2020-04-18 (×2): qty 1000

## 2020-04-18 MED ORDER — SODIUM CHLORIDE 0.9% FLUSH: 10.0000 mL | INTRAVENOUS | Status: DC | PRN

## 2020-04-18 MED ORDER — ALUM & MAG HYDROXIDE-SIMETH 200-200-20 MG/5ML PO SUSP: 30.0000 mL | ORAL | Status: DC | PRN

## 2020-04-18 MED ORDER — IPRATROPIUM-ALBUTEROL 0.5-2.5 (3) MG/3ML IN SOLN: 3.0000 mL | Freq: Four times a day (QID) | RESPIRATORY_TRACT | Status: DC | PRN

## 2020-04-18 MED ORDER — GUAIFENESIN 100 MG/5ML PO SOLN: 5.0000 mL | ORAL | Status: DC | PRN

## 2020-04-18 MED ORDER — OSMOLITE 1.2 CAL PO LIQD: 1000.0000 mL | ORAL | 0 refills | Status: DC

## 2020-04-18 MED ORDER — TRAZODONE HCL 50 MG PO TABS: 25.0000 mg | ORAL_TABLET | Freq: Every evening | ORAL | Status: DC | PRN

## 2020-04-18 MED ORDER — SODIUM CHLORIDE 0.9% FLUSH
10.0000 mL | Freq: Two times a day (BID) | INTRAVENOUS | Status: DC
  Administered 2020-04-18 – 2020-04-19 (×2): 10 mL

## 2020-04-18 MED ORDER — BISACODYL 10 MG RE SUPP: 10.0000 mg | Freq: Every day | RECTAL | Status: DC | PRN

## 2020-04-18 MED ORDER — CHLORHEXIDINE GLUCONATE CLOTH 2 % EX PADS
6.0000 | MEDICATED_PAD | Freq: Every day | CUTANEOUS | Status: DC
  Administered 2020-04-19 – 2020-04-20 (×2): 6 via TOPICAL

## 2020-04-18 MED ORDER — SENNOSIDES-DOCUSATE SODIUM 8.6-50 MG PO TABS: 1.0000 | ORAL_TABLET | Freq: Every evening | ORAL | Status: DC | PRN

## 2020-04-18 MED ORDER — PROCHLORPERAZINE EDISYLATE 10 MG/2ML IJ SOLN: 5.0000 mg | Freq: Four times a day (QID) | INTRAMUSCULAR | Status: DC | PRN

## 2020-04-18 MED ORDER — FREE WATER: 30.0000 mL | Status: DC

## 2020-04-18 MED ORDER — ORAL CARE MOUTH RINSE: 15.0000 mL | Freq: Two times a day (BID) | OROMUCOSAL | 0 refills | Status: DC

## 2020-04-18 MED ORDER — DIPHENHYDRAMINE HCL 25 MG PO CAPS: 25.0000 mg | ORAL_CAPSULE | Freq: Four times a day (QID) | ORAL | Status: DC | PRN

## 2020-04-18 MED ORDER — CEFAZOLIN SODIUM-DEXTROSE 2-4 GM/100ML-% IV SOLN
2.0000 g | Freq: Three times a day (TID) | INTRAVENOUS | Status: AC
  Administered 2020-04-18 – 2020-04-19 (×3): 2 g via INTRAVENOUS
  Filled 2020-04-18 (×3): qty 100

## 2020-04-18 MED ORDER — DIPHENHYDRAMINE HCL 25 MG PO CAPS: 25.0000 mg | ORAL_CAPSULE | Freq: Four times a day (QID) | ORAL | 0 refills | Status: DC | PRN

## 2020-04-18 MED ORDER — FLEET ENEMA 7-19 GM/118ML RE ENEM: 1.0000 | ENEMA | Freq: Once | RECTAL | Status: DC | PRN

## 2020-04-18 MED ORDER — ORAL CARE MOUTH RINSE
15.0000 mL | Freq: Two times a day (BID) | OROMUCOSAL | Status: DC
  Administered 2020-04-18 – 2020-05-04 (×30): 15 mL via OROMUCOSAL

## 2020-04-18 MED ORDER — PROCHLORPERAZINE 25 MG RE SUPP: 12.5000 mg | Freq: Four times a day (QID) | RECTAL | Status: DC | PRN

## 2020-04-18 MED ORDER — CEFAZOLIN SODIUM-DEXTROSE 2-4 GM/100ML-% IV SOLN: 2.0000 g | Freq: Three times a day (TID) | INTRAVENOUS | Status: DC

## 2020-04-18 MED ORDER — CHLORHEXIDINE GLUCONATE 0.12 % MT SOLN
15.0000 mL | Freq: Two times a day (BID) | OROMUCOSAL | Status: DC
  Administered 2020-04-18 – 2020-05-05 (×31): 15 mL via OROMUCOSAL
  Filled 2020-04-18 (×32): qty 15

## 2020-04-18 MED ORDER — ACD FORMULA A 0.73-2.45-2.2 GM/100ML VI SOLN: 1000.0000 mL | Status: DC

## 2020-04-18 MED ORDER — PANTOPRAZOLE SODIUM 40 MG PO PACK: 40.0000 mg | PACK | Freq: Every day | ORAL | Status: DC

## 2020-04-18 MED ORDER — INSULIN ASPART 100 UNIT/ML ~~LOC~~ SOLN: 0.0000 [IU] | SUBCUTANEOUS | 11 refills | Status: DC

## 2020-04-18 NOTE — Progress Notes (Signed)
PMR Admission Coordinator Pre-Admission Assessment   Patient: Alison Huffman is an 79 y.o., female MRN: [id number] DOB: 08/08/1941 Height:   Weight: 78.4 kg                                                                                                                                                  Insurance Information HMO:     PPO:      PCP:      IPA:      80/20: yes     OTHER:  PRIMARY: Medicare A and B      Policy#: [id number]      Subscriber: patient CM Name:       Phone#:      Fax#:  Pre-Cert#:       Employer:  Benefits:  Phone #: online     Name: verified eligibility online via [employer] on 04/16/20 Eff. Date: part A and B effective 11/02/2004     Deduct: $1,484      Out of Pocket Max: NA      Life Max: NA  CIR: Covered per Medicare guidelines once yearly deductible has been met      SNF: days 1-20, 100%, days 21-100, 80% Outpatient: 80%     Co-Pay: 20% Home Health: 100%       DME: 80%     Co-Pay: 20% Providers: Pt's choice SECONDARY: Tricare      Policy#: [id number]      Phone#: [phone]   Financial Counselor:       Phone#:    The "Data Collection Information Summary" for patients in Inpatient Rehabilitation Facilities with attached "Privacy Act Statement-Health Care Records" was provided and verbally reviewed with: Patient   Emergency Contact Information         Contact Information     Name Relation Home Work Mobile    Fields, Wendy Niece [phone]   [phone]    Spicer, Kimberly Niece     [phone]    Edwards,Barbara Sister [phone]   [phone]       Current Medical History  Patient Admitting Diagnosis: GBS   History of Present Illness: Alison Huffman is a 79 y.o. right-handed female with history of hypertension, psoriatic arthritis recently started on Enbrel, CAD with pacemaker, diastolic congestive heart failure, hyperlipidemia.  History taken from chart review and patient. Patient resides at [facility] independent living facility.  She  has a sister in the area, however, she cares for her husband.  She presents to [facility] on 03/27/20 with weakness, numbness, tingling of b/l LE. By report she initially did see her PCP. She was diagnosed with neuropathy and discharged home but noted progressive upper lower extremity weakness and paresthesia.  Within minutes of her admission the patient became unresponsive, ?seizures and rapid response was called.  EEG negative for seizure.  Echocardiogram with   EF of 60% with grade 1 diastolic dysfunction.  No wall motion abnormalities.  Lumbar puncture showed normal WBC and spinal fluid of 33.  CT angiogram of head and neck no acute abnormalities.  CT the unremarkable for acute intracranial process. No significant carotid or vertebral artery stenosis of the neck.  No intracranial stenosis or large vessel occlusion.admission labs with sodium of 130, troponin negative, SARS coronavirus negative.  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  Patient did complete course of IVIG with poor response.  She was started on PLEX, completing total of 5 rounds.  She did require intubation through 04/11/2020.  Currently maintained on Lovenox for DVT prophylaxis.  Hospital course further complicated by dysphagia and made NPO with alternative means of nutritional support.  She is completing a course of Ancef for pneumonia.  Therapy evaluations completed with recommendations for CIR. Pt is to admit to CIR on 04/18/20.    Glasgow Coma Scale Score: 15   Past Medical History      Past Medical History:  Diagnosis Date  . Ankle pain, right    . Atypical chest pain      a. 10/2018 MV: EF 57%. No ischemia/infarct.  . Coronary artery disease    . Diastolic dysfunction      a. 04/2018 Echo: EF 55-60%, no rwma, Gr1 DD. Triv AI, mild MR. Mod dil LA. Nl RV fxn. PASP nl.  . GERD (gastroesophageal reflux disease)    . Headache      "dull one sometimes daily, at least weekly in last couple months"  (07/22/2018)  . Hyperlipidemia    . Hypertension    . Inflammatory neuropathy (HCC)    . Joint pain in fingers of right hand    . Lichen sclerosus    . Osteopenia    . Pneumonia 2012? X 1  . 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 Psoriasis    . Psoriatic arthritis (HCC)      " dx'd the 1st of this year, 2019" (07/22/2018)  . Psoriatic arthritis (HCC)    . Rosacea    . Seborrheic keratosis    . Second degree heart block      a. 07/2018 s/p SJM Assurity MRI model PM2271 (Ser# [id number]).  . Sleep apnea      "using nasal pilllows since ~ 12/2017" (07/22/2018)  . TMJ (dislocation of temporomandibular joint)        Family History  family history includes Atrial fibrillation in her mother; Breast cancer in her maternal aunt, mother, and paternal aunt; Stroke in her father and mother.   Prior Rehab/Hospitalizations:  Has the patient had prior rehab or hospitalizations prior to admission? Yes   Has the patient had major surgery during 100 days prior to admission? Yes   Current Medications    Current Facility-Administered Medications:  .  acetaminophen (TYLENOL) 160 MG/5ML solution 500 mg, 500 mg, Per Tube, Q6H PRN, Marshall, Jessica, DO, 500 mg at 04/13/20 1623 .  acetaminophen (TYLENOL) tablet 650 mg, 650 mg, Oral, Q4H PRN, Kirkpatrick, McNeill P, MD .  ceFAZolin (ANCEF) IVPB 2g/100 mL premix, 2 g, Intravenous, Q8H, Marshall, Jessica, DO, Last Rate: 200 mL/hr at 04/17/20 1313, 2 g at 04/17/20 1313 .  chlorhexidine (PERIDEX) 0.12 % solution 15 mL, 15 mL, Mouth Rinse, BID, Mannam, Praveen, MD, 15 mL at 04/17/20 0915 .  Chlorhexidine Gluconate Cloth 2 % PADS 6 each, 6 each, Topical, Daily, Marshall, Jessica, DO, 6 each at 04/17/20   0917 .  citrate dextrose (ACD-A anticoagulant) solution 1,000 mL, 1,000 mL, Other, Continuous, Kirkpatrick, McNeill P, MD, 1,000 mL at 04/14/20 1820 .  diphenhydrAMINE (BENADRYL) capsule 25 mg, 25 mg, Oral, Q6H PRN, Kirkpatrick, McNeill P, MD .   enoxaparin (LOVENOX) injection 40 mg, 40 mg, Subcutaneous, Daily, Sood, Vineet, MD, 40 mg at 04/17/20 0915 .  feeding supplement (OSMOLITE 1.2 CAL) liquid 1,000 mL, 1,000 mL, Per Tube, Continuous, Mannam, Praveen, MD, Last Rate: 60 mL/hr at 04/17/20 0020, 1,000 mL at 04/17/20 0020 .  free water 30 mL, 30 mL, Per Tube, Q4H, Marshall, Jessica, DO, 30 mL at 04/17/20 1316 .  guaiFENesin (ROBITUSSIN) 100 MG/5ML solution 100 mg, 5 mL, Per Tube, Q4H PRN, Ogan, Okoronkwo U, MD, 100 mg at 04/16/20 0915 .  heparin sodium (porcine) injection 1,000 Units, 1,000 Units, Intracatheter, Once, Kirkpatrick, McNeill P, MD .  heparin sodium (porcine) injection 1,000-6,000 Units, 1,000-6,000 Units, Intravenous, PRN, Bowser, Grace E, NP, 1,000 Units at 04/07/20 1703 .  insulin aspart (novoLOG) injection 0-15 Units, 0-15 Units, Subcutaneous, Q4H, Sood, Vineet, MD, 2 Units at 04/17/20 0916 .  ipratropium-albuterol (DUONEB) 0.5-2.5 (3) MG/3ML nebulizer solution 3 mL, 3 mL, Nebulization, Q6H PRN, Ogan, Okoronkwo U, MD, 3 mL at 04/13/20 1522 .  MEDLINE mouth rinse, 15 mL, Mouth Rinse, q12n4p, Mannam, Praveen, MD, 15 mL at 04/17/20 1316 .  pantoprazole sodium (PROTONIX) 40 mg/20 mL oral suspension 40 mg, 40 mg, Per Tube, Daily, Sood, Vineet, MD, 40 mg at 04/17/20 0916 .  polyethylene glycol (MIRALAX / GLYCOLAX) packet 17 g, 17 g, Oral, Daily PRN, Vann, Jessica U, DO .  sodium chloride flush (NS) 0.9 % injection 10-40 mL, 10-40 mL, Intracatheter, Q12H, Agarwala, Ravi, MD, 10 mL at 04/17/20 0917 .  sodium chloride flush (NS) 0.9 % injection 10-40 mL, 10-40 mL, Intracatheter, PRN, Agarwala, Ravi, MD .  sodium chloride flush (NS) 0.9 % injection 10-40 mL, 10-40 mL, Intracatheter, Q12H, Agarwala, Ravi, MD, 10 mL at 04/17/20 0917 .  sodium chloride flush (NS) 0.9 % injection 10-40 mL, 10-40 mL, Intracatheter, PRN, Agarwala, Ravi, MD   Patients Current Diet:     Diet Order                      Diet NPO time specified Except for:  Ice Chips, Other (See Comments)  Diet effective now                      Precautions / Restrictions Precautions Precautions: Fall Precaution Comments: gross limb ataxia x4; significant weakness Restrictions Weight Bearing Restrictions: No    Has the patient had 2 or more falls or a fall with injury in the past year?No   Prior Activity Level Community (5-7x/wk): Independent PTA; ran her own errands, drove, no AD use PTA. would get out of the hose 3-4x/week; lived at ILF   Prior Functional Level Prior Function Level of Independence: Independent Comments: Still independent with IADL; no prior falls history; walked ~1 miile for activity with no AD   Self Care: Did the patient need help bathing, dressing, using the toilet or eating?  Independent   Indoor Mobility: Did the patient need assistance with walking from room to room (with or without device)? Independent   Stairs: Did the patient need assistance with internal or external stairs (with or without device)? Independent   Functional Cognition: Did the patient need help planning regular tasks such as shopping or remembering to take medications? Independent     Home Assistive Devices / Equipment Home Equipment: None   Prior Device Use: Indicate devices/aids used by the patient prior to current illness, exacerbation or injury? None of the above   Current Functional Level Cognition   Overall Cognitive Status: No family/caregiver present to determine baseline cognitive functioning Orientation Level: Oriented X4 General Comments: decreased processing and awareness of limb placement    Extremity Assessment (includes Sensation/Coordination)   Upper Extremity Assessment: RUE deficits/detail, LUE deficits/detail RUE Deficits / Details: RUE appears slightly weaker than L; shoulder elevation/depression 3+/5; retraction/protraction 3+/5; unablet o lift off from bed due to proximal weakness - FF adn abd 2/5; ER 3/5; elbow flex 3+/5;  elbow ext 3/5; sup/pron 3+/5; wrist/hand 3+/5; able to oppose thumb to all digits but reduce fine motor control; jerking movement present; ablet o manipulate nurse call bell adn yonker but difficulty controlling movement due to ataxic movements and proximal weakness RUE Sensation: decreased proprioception, decreased light touch RUE Coordination: decreased fine motor, decreased gross motor LUE Deficits / Details: similar to R but appears overall weaker  Lower Extremity Assessment: Defer to PT evaluation     ADLs   Overall ADL's : Needs assistance/impaired Eating/Feeding: NPO Grooming: Moderate assistance Grooming Details (indicate cue type and reason): able to complete oral care with toothette; difficulty reaching top of head adn comleting cross body for deoderant Upper Body Bathing: Sitting, Moderate assistance Lower Body Bathing: Maximal assistance, Bed level Upper Body Dressing : Maximal assistance Lower Body Dressing: Total assistance Toilet Transfer: +2 for physical assistance, Maximal assistance Toilet Transfer Details (indicate cue type and reason): simulated sit - stand; will need drop arm; use of DLE Toileting- Clothing Manipulation and Hygiene: Total assistance Toileting - Clothing Manipulation Details (indicate cue type and reason): incontinenet per nursing Functional mobility during ADLs: Maximal assistance, +2 for physical assistance     Mobility   Overal bed mobility: Needs Assistance Bed Mobility: Rolling, Supine to Sit, Sit to Supine Rolling: Min assist Sidelying to sit: Max assist, +2 for physical assistance Supine to sit: Max assist, +2 for safety/equipment Sit to supine: Max assist, +2 for physical assistance General bed mobility comments: cues for flexing knee and assist to reach for rail due to proximal weakness for roll bil x 2; Once holding rail, pt able to pull self over to side; sidelying to sit works well; Pt required assist to bring legs off of the bed and to  elevate trunk to sitting. Return to supine with max assist to lift legs and control trunk. Total +2 to slide toward HOB. Max +2 to scoot toward HOB at EOB     Transfers   Overall transfer level: Needs assistance Equipment used: 2 person hand held assist Transfer via [place]: Stedy Transfers: Sit to/from Stand Sit to Stand: Max assist, +2 physical assistance Squat pivot transfers: Max assist, +2 physical assistance General transfer comment: +2 A with use of bed pad and gait belt however unable to achieve full upright posture; Poor proprioception R LE requiring B knees be blocked and Rt foot blocked. x 2 trials in standing with face to face assist then on 3rd trial attempted Stedy with elevated bed with decreased ability to rise and pt stating fatigue     Ambulation / Gait / Stairs / Wheelchair Mobility   Ambulation/Gait General Gait Details: unsafe/unable at this time     Posture / Balance Dynamic Sitting Balance Sitting balance - Comments: Pt required minguard-min assist to prevent LOB in sitting Balance Overall balance assessment: Needs assistance  Sitting-balance support: Feet supported, Bilateral upper extremity supported Sitting balance-Leahy Scale: Poor Sitting balance - Comments: Pt required minguard-min assist to prevent LOB in sitting Postural control: Posterior lean Standing balance support: Bilateral upper extremity supported, During functional activity Standing balance-Leahy Scale: Zero Standing balance comment: pt has very poor standing balance require total assist to maintain standing     Special needs/care consideration BiPAP orders in acute chart.    Continuous Drip IV : cefazolin, citrate dextrose, feeding supplement   Oxygen: 2L [place]   Special Bed: may need air mattress bed due to immobility and pressure ulcer.    Skin: cracking to medial coccyx, ecchymosis to bilateral arm, hand; fissure to medial, upper coccyx; stage 2 pressure injury to sacrum   Special  service needs: chest physiotherapy orders under respiratory care    Designated visitor: TBD        Previous Home Environment (from acute therapy documentation) Living Arrangements: Alone Available Help at Discharge: Family, Friend(s) Type of Home: [place]: One level Home Access:  (to be determined) Bathroom Shower/Tub: Multimedia programmer: Handicapped height Bathroom Accessibility: Yes How Accessible: Accessible via walker Additional Comments: Independent Living Apt   Discharge Living Setting Plans for Discharge Living Setting: Other (Comment) (SNF (CIR admission would be for reduce burden of care)) Type of Home at Discharge: [place] Name at Discharge: [facility] (pt lives in [place] which is part of tertiary center with SNF attached) Discharge Home Layout: Other (Comment) (tertiary care center-anticipate it is single level ) Alternate Level Stairs-Rails: None Alternate Level Stairs-Number of Steps: NA Discharge Home Access: Level entry Discharge Bathroom Shower/Tub: Other (comment) Discharge Bathroom Toilet:  (unknow, per center) Discharge Bathroom Accessibility: Yes How Accessible: Other (comment) (per SNF) Does the patient have any problems obtaining your medications?: No   Social/Family/Support Systems Patient Roles: Other (Comment) (close to her two neices ) Contact Information: [name] (neice): [phone]; Joelene Millin (neice): [phone] Anticipated Caregiver: SNF  Anticipated Caregiver's Contact Information: see above for [name] and Joelene Millin  Ability/Limitations of Caregiver: SNF is plan  Caregiver Availability: Other (Comment) (per SNF) Discharge Plan Discussed with Primary Caregiver: Yes (pt and [name] (both understand plan for CIR then SNF)) Is Caregiver In Agreement with Plan?: Yes Does Caregiver/Family have Issues with Lodging/Transportation while Pt is in Rehab?: No     Goals Patient/Family Goal for  Rehab: PT/OT: Min/Mod A; SLP: Supervision Expected length of stay: 28-32 days Cultural Considerations: NA Pt/Family Agrees to Admission and willing to participate: Yes Program Orientation Provided & Reviewed with Pt/Caregiver Including Roles  & Responsibilities: Yes (pt and Wendy (neice))  Barriers to Discharge:  (plan is to go to SNF. )     Decrease burden of Care through IP rehab admission: Specialzed equipment needs, Diet advancement, Decrease number of caregivers and Patient/family education     Possible need for SNF placement upon discharge: Yes, plan is for pt to come to CIR for reduced burden of care and transition to SNF for additional care. Please see the above section for details on plan.      Patient Condition: This patient's medical and functional status has changed since the consult dated: 04/14/20 in which the Rehabilitation Physician determined and documented that the patient's condition is appropriate for intensive rehabilitative care in an inpatient rehabilitation facility. See "History of Present Illness" (above) for medical update. Functional changes are: improvement in bed mobility with rolling as pt was Max A and now Min A;  pt has been  evaluated by OT with recommendations for CIR. Pt continues to work with SLP for trails with liquids. Patient's medical and functional status update has been discussed with the Rehabilitation physician and patient remains appropriate for inpatient rehabilitation. Will admit to inpatient rehab Sunday, 04/18/20.   Preadmission Screen Completed By:  Kelly Gentry, OT, 04/17/2020 4:20 PM  With day of admit updates completed by: Sakoya Win, MS, CCC-SLP   ______________________________________________________________________   Discussed status with Dr. Swartz on 04/17/2020 at 1500 and received approval for admission today, 04/18/20.   Admission Coordinator:  Kelly Gentry, time: 4:13PM/ Date: 04/17/20.  With day of admit updates completed by: Teira Arcilla, MS, CCC-SLP    

## 2020-04-18 NOTE — Progress Notes (Signed)
Patient transferred to unit via hospital bed from 2W. Patient accompanied by nure and tech. Patient was brought on 2L O2 via [place].

## 2020-04-18 NOTE — Progress Notes (Signed)
PMR Admission Coordinator Pre-Admission Assessment   Patient: Alison Huffman is an 79 y.o., female MRN: [id number] DOB: [birth year] Height:   Weight: 78.4 kg                                                                                                                                                  Insurance Information HMO:     PPO:      PCP:      IPA:      80/20: yes     OTHER:  PRIMARY: Medicare A and B      Policy#: [id number]      Subscriber: patient CM Name:       Phone#:      Fax#:  Pre-Cert#:       Employer:  Benefits:  Phone #: online     Name: verified eligibility online via Octa on 04/16/20 Eff. Date: part A and B effective 11/02/2004     Deduct: $1,484      Out of Pocket Max: NA      Life Max: NA  CIR: Covered per Medicare guidelines once yearly deductible has been met      SNF: days 1-20, 100%, days 21-100, 80% Outpatient: 80%     Co-Pay: 20% Home Health: 100%       DME: 80%     Co-Pay: 20% Providers: Pt's choice SECONDARY: Tricare      Policy#: [id number]      Phone#: [phone]   Financial Counselor:       Phone#:    The "Data Collection Information Summary" for patients in Inpatient Rehabilitation Facilities with attached "Privacy Act [place] Records" was provided and verbally reviewed with: Patient   Emergency Contact Information         Contact Information     Name Relation Home Work [place] Niece [phone]   [phone]    Pecola Leisure Niece     [phone]    Samson Frederic Sister [phone]   [phone]       Current Medical History  Patient Admitting Diagnosis: GBS   History of Present Illness: Alison Huffman is a 79 y.o. right-handed female with history of hypertension, psoriatic arthritis recently started on Enbrel, CAD with pacemaker, diastolic congestive heart failure, hyperlipidemia.  History taken from chart review and patient. Patient resides at [facility] independent [facility] facility.  She  has a sister in the area, however, she cares for her husband.  She presents to [facility] on 03/27/20 with weakness, numbness, tingling of b/l LE. By report she initially did see her PCP. She was diagnosed with neuropathy and discharged home but noted progressive upper lower extremity weakness and paresthesia.  Within minutes of her admission the patient became unresponsive, ?seizures and rapid response was called.  EEG negative for seizure.  Echocardiogram with  EF of 60% with grade 1 diastolic dysfunction.  No wall motion abnormalities.  Lumbar puncture showed normal WBC and spinal fluid of 33.  CT angiogram of head and neck no acute abnormalities.  CT the unremarkable for acute intracranial process. No significant carotid or vertebral artery stenosis of the neck.  No intracranial stenosis or large vessel occlusion.admission labs with sodium of 130, troponin negative, SARS coronavirus negative.  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  Patient did complete course of IVIG with poor response.  She was started on PLEX, completing total of 5 rounds.  She did require intubation through 04/11/2020.  Currently maintained on Lovenox for DVT prophylaxis.  Hospital course further complicated by dysphagia and made NPO with alternative means of nutritional support.  She is completing a course of Ancef for pneumonia.  Therapy evaluations completed with recommendations for CIR. Pt is to admit to CIR on 04/18/20.    Glasgow Coma Scale Score: 15   Past Medical History      Past Medical History:  Diagnosis Date  . Ankle pain, right    . Atypical chest pain      a. 10/2018 MV: EF 57%. No ischemia/infarct.  . Coronary artery disease    . Diastolic dysfunction      a. 04/2018 Echo: EF 55-60%, no rwma, Gr1 DD. Triv AI, mild MR. Mod dil LA. Nl RV fxn. PASP nl.  . GERD (gastroesophageal reflux disease)    . Headache      "dull one sometimes daily, at least weekly in last couple months"  (07/22/2018)  . Hyperlipidemia    . Hypertension    . 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 Joint pain in fingers of right hand    . Lichen sclerosus    . Osteopenia    . Pneumonia 2012? X 1  . 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 Psoriasis    . Psoriatic arthritis ([place])      " dx'd the 1st of this year, 2019" (07/22/2018)  . Psoriatic arthritis ([place])    . Rosacea    . Seborrheic keratosis    . Second degree heart block      a. 07/2018 s/p SJM Assurity MRI model PM2271 (Ser# [id number]).  . Sleep apnea      "using nasal pilllows since ~ 12/2017" (07/22/2018)  . TMJ (dislocation of temporomandibular joint)        Family History  family history includes Atrial fibrillation in her mother; Breast cancer in her maternal aunt, mother, and paternal aunt; Stroke in her father and mother.   Prior Rehab/Hospitalizations:  Has the patient had prior rehab or hospitalizations prior to admission? Yes   Has the patient had major surgery during 100 days prior to admission? Yes   Current Medications    Current Facility-Administered Medications:  .  acetaminophen (TYLENOL) 160 MG/5ML solution 500 mg, 500 mg, Per Tube, Q6H PRN, Audria Nine, DO, 500 mg at 04/13/20 1623 .  acetaminophen (TYLENOL) tablet 650 mg, 650 mg, Oral, Q4H PRN, Greta Doom, MD .  ceFAZolin (ANCEF) IVPB 2g/100 mL premix, 2 g, Intravenous, Q8H, Audria Nine, DO, Last Rate: 200 mL/hr at 04/17/20 1313, 2 g at 04/17/20 1313 .  chlorhexidine (PERIDEX) 0.12 % solution 15 mL, 15 mL, Mouth Rinse, BID, Mannam, Praveen, MD, 15 mL at 04/17/20 0915 .  Chlorhexidine Gluconate Cloth 2 % PADS 6 each, 6 each, Topical, Daily, Audria Nine, DO, 6 each at 04/17/20  0917 .  citrate dextrose (ACD-A anticoagulant) solution 1,000 mL, 1,000 mL, Other, Continuous, Greta Doom, MD, 1,000 mL at 04/14/20 1820 .  diphenhydrAMINE (BENADRYL) capsule 25 mg, 25 mg, Oral, Q6H PRN, Greta Doom, MD .   enoxaparin (LOVENOX) injection 40 mg, 40 mg, Subcutaneous, Daily, Chesley Mires, MD, 40 mg at 04/17/20 0915 .  feeding supplement (OSMOLITE 1.2 CAL) liquid 1,000 mL, 1,000 mL, Per Tube, Continuous, Mannam, Praveen, MD, Last Rate: 60 mL/hr at 04/17/20 0020, 1,000 mL at 04/17/20 0020 .  free water 30 mL, 30 mL, Per Tube, Q4H, Marshall, Jessica, DO, 30 mL at 04/17/20 1316 .  guaiFENesin (ROBITUSSIN) 100 MG/5ML solution 100 mg, 5 mL, Per Tube, Q4H PRN, Ogan, Okoron[place]wo U, MD, 100 mg at 04/16/20 0915 .  heparin sodium (porcine) injection 1,000 Units, 1,000 Units, Intracatheter, Once, Roland Rack P, MD .  heparin sodium (porcine) injection 1,000-6,000 Units, 1,000-6,000 Units, Intravenous, PRN, Cristal Generous, NP, 1,000 Units at 04/07/20 1703 .  insulin aspart (novoLOG) injection 0-15 Units, 0-15 Units, Subcutaneous, Q4H, Chesley Mires, MD, 2 Units at 04/17/20 0916 .  ipratropium-albuterol (DUONEB) 0.5-2.5 (3) MG/3ML nebulizer solution 3 mL, 3 mL, Nebulization, Q6H PRN, Frederik Pear, MD, 3 mL at 04/13/20 1522 .  MEDLINE mouth rinse, 15 mL, Mouth Rinse, q12n4p, Mannam, Praveen, MD, 15 mL at 04/17/20 1316 .  pantoprazole sodium (PROTONIX) 40 mg/20 mL oral suspension 40 mg, 40 mg, Per Tube, Daily, Chesley Mires, MD, 40 mg at 04/17/20 0916 .  polyethylene glycol (MIRALAX / GLYCOLAX) packet 17 g, 17 g, Oral, Daily PRN, Vann, Jessica U, DO .  sodium chloride flush (NS) 0.9 % injection 10-40 mL, 10-40 mL, Intracatheter, Q12H, Agarwala, Ravi, MD, 10 mL at 04/17/20 0917 .  sodium chloride flush (NS) 0.9 % injection 10-40 mL, 10-40 mL, Intracatheter, PRN, Agarwala, Ravi, MD .  sodium chloride flush (NS) 0.9 % injection 10-40 mL, 10-40 mL, Intracatheter, Q12H, Agarwala, Ravi, MD, 10 mL at 04/17/20 0917 .  sodium chloride flush (NS) 0.9 % injection 10-40 mL, 10-40 mL, Intracatheter, PRN, Kipp Brood, MD   Patients Current Diet:     Diet Order                      Diet NPO time specified Except for:  Ice Chips, Other (See Comments)  Diet effective now                      Precautions / Restrictions Precautions Precautions: Fall Precaution Comments: gross limb ataxia x4; significant weakness Restrictions Weight Bearing Restrictions: No    Has the patient had 2 or more falls or a fall with injury in the past year?No   Prior Activity Level Community (5-7x/wk): Independent PTA; ran her own errands, drove, no AD use PTA. would get out of the hose 3-4x/week; lived at [place]   Prior Functional Level Prior Function Level of Independence: Independent Comments: Still independent with IADL; no prior falls history; walked ~1 miile for activity with no AD   Self Care: Did the patient need help bathing, dressing, using the toilet or eating?  Independent   Indoor Mobility: Did the patient need assistance with walking from room to room (with or without device)? Independent   Stairs: Did the patient need assistance with internal or external stairs (with or without device)? Independent   Functional Cognition: Did the patient need help planning regular tasks such as shopping or remembering to take medications? Independent  Home Assistive Devices / Equipment Home Equipment: None   Prior Device Use: Indicate devices/aids used by the patient prior to current illness, exacerbation or injury? None of the above   Current Functional Level Cognition   Overall Cognitive Status: No family/caregiver present to determine baseline cognitive functioning Orientation Level: Oriented X4 General Comments: decreased processing and awareness of limb placement    Extremity Assessment (includes Sensation/Coordination)   Upper Extremity Assessment: RUE deficits/detail, LUE deficits/detail RUE Deficits / Details: RUE appears slightly weaker than L; shoulder elevation/depression 3+/5; retraction/protraction 3+/5; unablet o lift off from bed due to proximal weakness - FF adn abd 2/5; ER 3/5; elbow flex 3+/5;  elbow ext 3/5; sup/pron 3+/5; wrist/hand 3+/5; able to oppose thumb to all digits but reduce fine motor control; jerking movement present; ablet o manipulate nurse call bell adn yonker but difficulty controlling movement due to ataxic movements and proximal weakness RUE Sensation: decreased proprioception, decreased light touch RUE Coordination: decreased fine motor, decreased gross motor LUE Deficits / Details: similar to R but appears overall weaker  Lower Extremity Assessment: Defer to PT evaluation     ADLs   Overall ADL's : Needs assistance/impaired Eating/Feeding: NPO Grooming: Moderate assistance Grooming Details (indicate cue type and reason): able to complete oral care with toothette; difficulty reaching top of head adn comleting cross body for deoderant Upper Body Bathing: Sitting, Moderate assistance Lower Body Bathing: Maximal assistance, Bed level Upper Body Dressing : Maximal assistance Lower Body Dressing: Total assistance Toilet Transfer: +2 for physical assistance, Maximal assistance Toilet Transfer Details (indicate cue type and reason): simulated sit - stand; will need drop arm; use of DLE Toileting- Clothing Manipulation and Hygiene: Total assistance Toileting - Clothing Manipulation Details (indicate cue type and reason): incontinenet per nursing Functional mobility during ADLs: Maximal assistance, +2 for physical assistance     Mobility   Overal bed mobility: Needs Assistance Bed Mobility: Rolling, Supine to Sit, Sit to Supine Rolling: Min assist Sidelying to sit: Max assist, +2 for physical assistance Supine to sit: Max assist, +2 for safety/equipment Sit to supine: Max assist, +2 for physical assistance General bed mobility comments: cues for flexing knee and assist to reach for rail due to proximal weakness for roll bil x 2; Once holding rail, pt able to pull self over to side; sidelying to sit works well; Pt required assist to bring legs off of the bed and to  elevate trunk to sitting. Return to supine with max assist to lift legs and control trunk. Total +2 to slide toward HOB. Max +2 to scoot toward HOB at EOB     Transfers   Overall transfer level: Needs assistance Equipment used: 2 person hand held assist Transfer via [place]: Stedy Transfers: Sit to/from Stand Sit to Stand: Max assist, +2 physical assistance Squat pivot transfers: Max assist, +2 physical assistance General transfer comment: +2 A with use of bed pad and gait belt however unable to achieve full upright posture; Poor proprioception R LE requiring B knees be blocked and Rt foot blocked. x 2 trials in standing with face to face assist then on 3rd trial attempted Stedy with elevated bed with decreased ability to rise and pt stating fatigue     Ambulation / Gait / Stairs / Wheelchair Mobility   Ambulation/Gait General Gait Details: unsafe/unable at this time     Posture / Balance Dynamic Sitting Balance Sitting balance - Comments: Pt required minguard-min assist to prevent LOB in sitting Balance Overall balance assessment: Needs assistance  Sitting-balance support: Feet supported, Bilateral upper extremity supported Sitting balance-Leahy Scale: Poor Sitting balance - Comments: Pt required minguard-min assist to prevent LOB in sitting Postural control: Posterior lean Standing balance support: Bilateral upper extremity supported, During functional activity Standing balance-Leahy Scale: Zero Standing balance comment: pt has very poor standing balance require total assist to maintain standing     Special needs/care consideration BiPAP orders in acute chart.    Continuous Drip IV : cefazolin, citrate dextrose, feeding supplement   Oxygen: 2L [place]   Special Bed: may need air mattress bed due to immobility and pressure ulcer.    Skin: cracking to medial coccyx, ecchymosis to bilateral arm, hand; fissure to medial, upper coccyx; stage 2 pressure injury to sacrum   Special  service needs: chest physiotherapy orders under respiratory care    Designated visitor: TBD        Previous Home Environment (from acute therapy documentation) Living Arrangements: Alone Available Help at Discharge: Family, Friend(s) Type of Home: [place]: One level Home Access:  (to be determined) Bathroom Shower/Tub: Multimedia programmer: Handicapped height Bathroom Accessibility: Yes How Accessible: Accessible via walker Additional Comments: Independent Living Apt   Discharge Living Setting Plans for Discharge Living Setting: Other (Comment) (SNF (CIR admission would be for reduce burden of care)) Type of Home at Discharge: [place] Name at Discharge: [facility] (pt lives in [place] which is part of tertiary center with SNF attached) Discharge Home Layout: Other (Comment) (tertiary care center-anticipate it is single level ) Alternate Level Stairs-Rails: None Alternate Level Stairs-Number of Steps: NA Discharge Home Access: Level entry Discharge Bathroom Shower/Tub: Other (comment) Discharge Bathroom Toilet:  (unknow, per center) Discharge Bathroom Accessibility: Yes How Accessible: Other (comment) (per SNF) Does the patient have any problems obtaining your medications?: No   Social/Family/Support Systems Patient Roles: Other (Comment) (close to her two neices ) Contact Information: [name] (neice): [phone]; Joelene Millin (neice): [phone] Anticipated Caregiver: SNF  Anticipated Caregiver's Contact Information: see above for [name] and Joelene Millin  Ability/Limitations of Caregiver: SNF is plan  Caregiver Availability: Other (Comment) (per SNF) Discharge Plan Discussed with Primary Caregiver: Yes (pt and [name] (both understand plan for CIR then SNF)) Is Caregiver In Agreement with Plan?: Yes Does Caregiver/Family have Issues with Lodging/Transportation while Pt is in Rehab?: No     Goals Patient/Family Goal for  Rehab: PT/OT: Min/Mod A; SLP: Supervision Expected length of stay: 28-32 days Cultural Considerations: NA Pt/Family Agrees to Admission and willing to participate: Yes Program Orientation Provided & Reviewed with Pt/Caregiver Including Roles  & Responsibilities: Yes (pt and Wendy (neice))  Barriers to Discharge:  (plan is to go to SNF. )     Decrease burden of Care through IP rehab admission: Specialzed equipment needs, Diet advancement, Decrease number of caregivers and Patient/family education     Possible need for SNF placement upon discharge: Yes, plan is for pt to come to CIR for reduced burden of care and transition to SNF for additional care. Please see the above section for details on plan.      Patient Condition: This patient's medical and functional status has changed since the consult dated: 04/14/20 in which the Rehabilitation Physician determined and documented that the patient's condition is appropriate for intensive rehabilitative care in an inpatient rehabilitation facility. See "History of Present Illness" (above) for medical update. Functional changes are: improvement in bed mobility with rolling as pt was Max A and now Min A;  pt has been  evaluated by OT with recommendations for CIR. Pt continues to work with SLP for trails with liquids. Patient's medical and functional status update has been discussed with the Rehabilitation physician and patient remains appropriate for inpatient rehabilitation. Will admit to inpatient rehab Sunday, 04/18/20.   Preadmission Screen Completed By:  Raechel Ache, OT, 04/17/2020 4:20 PM  With day of admit updates completed by: Clemens Catholic, MS, CCC-SLP   ______________________________________________________________________   Discussed status with Dr. Naaman Plummer on 04/17/2020 at 1500 and received approval for admission today, 04/18/20.   Admission Coordinator:  Raechel Ache, time: 4:13PM/ Date: 04/17/20.  With day of admit updates completed by: Clemens Catholic, [place], [place]

## 2020-04-18 NOTE — H&P (Signed)
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 Huffman is an 79 year old female with history of CAD, PPM,  HTN, OSA, psoriasis arthritis --transitioned from Humira to Enbrel recently; who was admitted to [facility] on 03/27/20 with reports of numbness and tingling which started in BLE,progressed to torso and BUE, as well as weakness.  Neurology consulted recommended LP due to concerns of GBS with progressive numbness/weakness with decreased reflexes. LP negative but she was started on IVIG empirically. She developed significant hyponatremia with drop in  Na-119 felt to be due to IVIG and nephrology recommended discontinuation of IVIG as well as FR. Patient without improvement and was transferred to [facility] with plans for MRI on 07/03 but developed unresponsiveness with blank stare and developed respiratory distress requiring intubation. She was treated with Keppra briefly due to seizure in setting of hyponatremia.  EEG showed diffuse slowing without seizure activity.    While in MRI, she developed respiratory distress due to mucous plugging with decrease in LOC and code blue and required manual ventilation. She developed bilateral pneumothoraces due to high pressure requiring bilateral chest tube placement. MRI brain and was negative for acute abnormality. MRI cervical/thoracic spine showed cervical spondylosis but was negative for significant stenosis or cord abnormality.  She was started on plasmapheresis on 07/07 with improvement in strength. She was extubated  07/11 but developed respiratory distress later that day requiring BIPAP. She was started on Solumedrol due to stridor and Cefepime added for staph HCAP. Hypoxia improving and she has been weaned to    Foley in place due to urinary retention.  NPO recommended due to severe oropharyngeal dysphagia with difficulty in airway protection due to weakness and high aspiration risk--OK for trials of  teaspoon of thins/nectas with ST/RN.  Dr. 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        Review of Systems  Constitutional: Negative for chills and fever.  HENT: Negative for hearing loss and tinnitus.   Eyes: Negative for blurred vision and double vision.  Respiratory: Positive for cough and shortness of breath.   Cardiovascular: Negative for chest pain and palpitations.  Gastrointestinal: Negative for heartburn and nausea.  Musculoskeletal: Positive for joint pain (biltarel thumbs> hands). Negative for myalgias.  Neurological: Positive for sensory change and weakness. Negative for dizziness and headaches.  Psychiatric/Behavioral: The patient is not nervous/anxious.           Past Medical History:  Diagnosis Date  . Ankle pain, right    . Atypical chest pain      a. 10/2018 MV: EF 57%. No ischemia/infarct.  . Coronary artery disease    . Diastolic dysfunction      a. 04/2018 Echo: EF 55-60%, no rwma, Gr1 DD. Triv AI, mild MR. Mod dil LA. Nl RV fxn. PASP nl.  . GERD (gastroesophageal reflux disease)    . Headache      "dull one sometimes daily, at least weekly in last couple months" (07/22/2018)  . Hyperlipidemia    . Hypertension    . 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 Joint pain in fingers of right hand    . Lichen sclerosus    . Osteopenia    . Pneumonia 2012? X 1  . Presence of permanent cardiac pacemaker  07/23/2018  . Psoriasis    . Psoriatic arthritis ([place])      " dx'd the 1st of this year, 2019" (07/22/2018)  . Psoriatic arthritis ([place])    . Rosacea    . Seborrheic keratosis    . Second degree heart block      a. 07/2018 s/p SJM Assurity MRI model PM2271 (Ser# [id number]).  . Sleep apnea      "using nasal pilllows since ~ 12/2017" (07/22/2018)  . TMJ (dislocation of temporomandibular joint)               Past Surgical History:  Procedure Laterality Date  . APPENDECTOMY      . BREAST CYST ASPIRATION Bilateral    . BREAST CYST EXCISION Right 1978    benign  . COLONOSCOPY      . COLONOSCOPY WITH PROPOFOL N/A 07/10/2019    Procedure: COLONOSCOPY WITH PROPOFOL;  Surgeon: Lollie Sails, MD;  Location: [facility] ENDOSCOPY;  Service: Endoscopy;  Laterality: N/A;  . ESOPHAGOGASTRODUODENOSCOPY (EGD) WITH PROPOFOL N/A 07/10/2019    Procedure: ESOPHAGOGASTRODUODENOSCOPY (EGD) WITH PROPOFOL;  Surgeon: Lollie Sails, MD;  Location: [facility] ENDOSCOPY;  Service: Endoscopy;  Laterality: N/A;  . HYSTEROSCOPY WITH D & C N/A 11/05/2018    Procedure: DILATATION AND CURETTAGE /HYSTEROSCOPY;  Surgeon: Homero Fellers, MD;  Location: [facility] ORS;  Service: Gynecology;  Laterality: N/A;  . INSERT / REPLACE / REMOVE PACEMAKER      . PACEMAKER IMPLANT N/A 07/23/2018    SJM Assurity [id number] implanted by Dr Rayann Heman for mobitz II second degree AV block  . UTERINE POLYPS REMOVAL               Family History  Problem Relation Age of Onset  . Stroke Mother    . Atrial fibrillation Mother    . Breast cancer Mother    . Stroke Father    . Breast cancer Maternal Aunt    . Breast cancer Paternal Aunt        Social History:   Lives alone at [place]. 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  She  reports that she smoked one cigarettes now and then--socially.  She has never used smokeless tobacco. She reports current alcohol use-- once a month. She reports that she does not use drugs.         Allergies  Allergen Reactions  . Nickel            Medications Prior to Admission  Medication Sig Dispense Refill  . acetaminophen (TYLENOL) 325 MG tablet Take 325-650 mg by mouth every 6 (six) hours as needed for mild pain or headache.       . bisacodyl (DULCOLAX) 10 MG suppository Place 1 suppository (10 mg total) rectally daily as needed for moderate constipation. 12  suppository 0  . Clobetasol Prop Emollient Base (CLOBETASOL PROPIONATE E) 0.05 % emollient cream Apply 1 application topically every Sunday.       [name] desonide (DESOWEN) 0.05 % ointment Apply 1 application topically 2 (two) times daily as needed (for psoriasis).       [name] etanercept (ENBREL) 50 MG/ML injection Inject 50 mg into the skin once a week. LF on 02-19-20 Ds 28      . famotidine (PEPCID) 20 MG tablet Take 20 mg by mouth 2 (two) times daily as needed for heartburn or indigestion.       . Methylcellulose, Laxative, (CITRUCEL PO) Take 1 Dose  by mouth daily as needed (constipation).       . metroNIDAZOLE (METROCREAM) 0.75 % cream Apply topically 2 (two) times daily as needed (facial redness/irritation.).       [name] nystatin cream (MYCOSTATIN) Apply 1 application topically 2 (two) times daily as needed for dry skin.       . polyethylene glycol (MIRALAX / GLYCOLAX) 17 g packet Take 17 g by mouth daily as needed for severe constipation. 14 each 0  . rosuvastatin (CRESTOR) 5 MG tablet Take 1 tablet (5 mg total) by mouth daily. 90 tablet 3  . tacrolimus (PROTOPIC) 0.1 % ointment Apply topically 2 (two) times daily as needed (for psoriasis).       . valsartan (DIOVAN) 80 MG tablet TAKE 1 TABLET DAILY (Patient taking differently: Take 80 mg by mouth daily. ) 90 tablet 3  . enoxaparin (LOVENOX) 40 MG/0.4ML injection Inject 0.4 mLs (40 mg total) into the skin daily. 0 mL    . ondansetron (ZOFRAN) 4 MG tablet Take 1 tablet (4 mg total) by mouth every 6 (six) hours as needed for nausea. 20 tablet 0      Drug Regimen Review  Drug regimen was reviewed and remains appropriate with no significant issues identified   Home: Home Living Family/patient expects to be discharged to:: Private residence Living Arrangements: Alone Available Help at Discharge: Family, Friend(s) Type of Home: Apartment Home Access:  (to be determined) Home Layout: One level Bathroom Shower/Tub: Multimedia programmer:  Handicapped height Bathroom Accessibility: Yes Home Equipment: None Additional Comments: Independent Living Apt   Functional History: Prior Function Level of Independence: Independent Comments: Still independent with IADL; no prior falls history; walked ~1 miile for activity with no AD   Functional Status:  Mobility: Bed Mobility Overal bed mobility: Needs Assistance Bed Mobility: Rolling, Supine to Sit, Sit to Supine Rolling: Min assist Sidelying to sit: Max assist, +2 for physical assistance Supine to sit: Max assist, +2 for safety/equipment Sit to supine: Max assist, +2 for physical assistance General bed mobility comments: cues for flexing knee and assist to reach for rail due to proximal weakness for roll bil x 2; Once holding rail, pt able to pull self over to side; sidelying to sit works well; Pt required assist to bring legs off of the bed and to elevate trunk to sitting. Return to supine with max assist to lift legs and control trunk. Total +2 to slide toward HOB. Max +2 to scoot toward HOB at EOB Transfers Overall transfer level: Needs assistance Equipment used: 2 person hand held assist Transfer via Tiger: Stedy Transfers: Sit to/from Stand Sit to Stand: Max assist, +2 physical assistance Squat pivot transfers: Max assist, +2 physical assistance General transfer comment: +2 A with use of bed pad and gait belt however unable to achieve full upright posture; Poor proprioception R LE requiring B knees be blocked and Rt foot blocked. x 2 trials in standing with face to face assist then on 3rd trial attempted Stedy with elevated bed with decreased ability to rise and pt stating fatigue Ambulation/Gait General Gait Details: unsafe/unable at this time   ADL: ADL Overall ADL's : Needs assistance/impaired Eating/Feeding: NPO Grooming: Moderate assistance Grooming Details (indicate cue type and reason): able to complete oral care with toothette; difficulty reaching top  of head adn comleting cross body for deoderant Upper Body Bathing: Sitting, Moderate assistance Lower Body Bathing: Maximal assistance, Bed level Upper Body Dressing : Maximal assistance Lower Body Dressing: Total assistance Toilet  Transfer: +2 for physical assistance, Maximal assistance Toilet Transfer Details (indicate cue type and reason): simulated sit - stand; will need drop arm; use of DLE Toileting- Clothing Manipulation and Hygiene: Total assistance Toileting - Clothing Manipulation Details (indicate cue type and reason): incontinenet per nursing Functional mobility during ADLs: Maximal assistance, +2 for physical assistance   Cognition: Cognition Overall Cognitive Status: No family/caregiver present to determine baseline cognitive functioning Orientation Level: Oriented X4 Cognition Arousal/Alertness: Awake/alert Behavior During Therapy: Flat affect, Anxious Overall Cognitive Status: No family/caregiver present to determine baseline cognitive functioning General Comments: decreased processing and awareness of limb placement     Blood pressure 135/60, pulse 92, temperature 98.3 F (36.8 C), temperature source Oral, resp. rate (!) 21, weight 74.6 kg, SpO2 100 %. Physical Exam Constitutional:      Appearance: Normal appearance.  HENT:     Head: Normocephalic.     Right Ear: External ear normal.     Left Ear: External ear normal.     Nose:     Comments: NGT    Mouth/Throat:     Pharynx: No oropharyngeal exudate.     Comments: Thrush on tongue Eyes:     Extraocular Movements: Extraocular movements intact.     Pupils: Pupils are equal, round, and reactive to light.  Cardiovascular:     Rate and Rhythm: Normal rate and regular rhythm.     Heart sounds: No murmur heard.  No friction rub.  Pulmonary:     Effort: Pulmonary effort is normal. No respiratory distress.     Breath sounds: No stridor. No wheezing or rhonchi.  Abdominal:     General: Bowel sounds are normal.  There is no distension.     Palpations: Abdomen is soft.     Tenderness: There is no abdominal tenderness.  Musculoskeletal:        General: No swelling, deformity or signs of injury.  Skin:    General: Skin is warm and dry.     Comments: Stage II sacral wound  Neurological:     Mental Status: She is alert.     Comments: Pt alert and oriented x3. Weak cough and phonation. Sl dysarthria. Reasonable insight and awareness. RUE 2- to 3+ prox to distal. LUE 2 to 4/5 prox to distal. BLE 3- to 3/5 HF, KE and 3/5 ADF/PF. Stocking glove sensory loss below knees bilaterally and along finger tips of both hands. DTR's absent  Psychiatric:        Mood and Affect: Mood normal.        Behavior: Behavior normal.        Lab Results Last 48 Hours        Results for orders placed or performed during the hospital encounter of 04/03/20 (from the past 48 hour(s))  Glucose, capillary     Status: Abnormal    Collection Time: 04/14/20 11:49 AM  Result Value Ref Range    Glucose-Capillary 117 (H) 70 - 99 mg/dL      Comment: Glucose reference range applies only to samples taken after fasting for at least 8 hours.  Glucose, capillary     Status: Abnormal    Collection Time: 04/14/20  3:51 PM  Result Value Ref Range    Glucose-Capillary 123 (H) 70 - 99 mg/dL      Comment: Glucose reference range applies only to samples taken after fasting for at least 8 hours.  Glucose, capillary     Status: None    Collection Time: 04/14/20  8:01  PM  Result Value Ref Range    Glucose-Capillary 89 70 - 99 mg/dL      Comment: Glucose reference range applies only to samples taken after fasting for at least 8 hours.  Glucose, capillary     Status: Abnormal    Collection Time: 04/15/20 12:01 AM  Result Value Ref Range    Glucose-Capillary 139 (H) 70 - 99 mg/dL      Comment: Glucose reference range applies only to samples taken after fasting for at least 8 hours.  Glucose, capillary     Status: None    Collection Time:  04/15/20  3:41 AM  Result Value Ref Range    Glucose-Capillary 78 70 - 99 mg/dL      Comment: Glucose reference range applies only to samples taken after fasting for at least 8 hours.  CBC     Status: Abnormal    Collection Time: 04/15/20  4:04 AM  Result Value Ref Range    WBC 13.9 (H) 4.0 - 10.5 K/uL    RBC 3.48 (L) 3.87 - 5.11 MIL/uL    Hemoglobin 10.5 (L) 12.0 - 15.0 g/dL    HCT 33.6 (L) 36 - 46 %    MCV 96.6 80.0 - 100.0 fL    MCH 30.2 26.0 - 34.0 pg    MCHC 31.3 30.0 - 36.0 g/dL    RDW 14.0 11.5 - 15.5 %    Platelets 343 150 - 400 K/uL    nRBC 0.0 0.0 - 0.2 %      Comment: Performed at [facility], [place] [address], [place], [place] [address]  Basic metabolic panel     Status: Abnormal    Collection Time: 04/15/20  4:04 AM  Result Value Ref Range    Sodium 144 135 - 145 mmol/L    Potassium 3.8 3.5 - 5.1 mmol/L    Chloride 110 98 - 111 mmol/L    CO2 26 22 - 32 mmol/L    Glucose, Bld 108 (H) 70 - 99 mg/dL      Comment: Glucose reference range applies only to samples taken after fasting for at least 8 hours.    BUN 16 8 - 23 mg/dL    Creatinine, Ser 0.33 (L) 0.44 - 1.00 mg/dL    Calcium 8.7 (L) 8.9 - 10.3 mg/dL    GFR calc non Af Amer >60 >60 mL/min    GFR calc Af Amer >60 >60 mL/min    Anion gap 8 5 - 15      Comment: Performed at [facility] [address], [place], [place] [address]  Glucose, capillary     Status: Abnormal    Collection Time: 04/15/20  7:46 AM  Result Value Ref Range    Glucose-Capillary 126 (H) 70 - 99 mg/dL      Comment: Glucose reference range applies only to samples taken after fasting for at least 8 hours.  Glucose, capillary     Status: Abnormal    Collection Time: 04/15/20 11:44 AM  Result Value Ref Range    Glucose-Capillary 123 (H) 70 - 99 mg/dL      Comment: Glucose reference range applies only to samples taken after fasting for at least 8 hours.  Glucose, capillary     Status: None    Collection Time: 04/15/20  4:39 PM    Result Value Ref Range    Glucose-Capillary 76 70 - 99 mg/dL      Comment: Glucose reference range applies  only to samples taken after fasting for at least 8 hours.  Glucose, capillary     Status: Abnormal    Collection Time: 04/15/20  7:36 PM  Result Value Ref Range    Glucose-Capillary 100 (H) 70 - 99 mg/dL      Comment: Glucose reference range applies only to samples taken after fasting for at least 8 hours.  Glucose, capillary     Status: Abnormal    Collection Time: 04/15/20 11:06 PM  Result Value Ref Range    Glucose-Capillary 123 (H) 70 - 99 mg/dL      Comment: Glucose reference range applies only to samples taken after fasting for at least 8 hours.  CBC     Status: Abnormal    Collection Time: 04/16/20  1:24 AM  Result Value Ref Range    WBC 11.9 (H) 4.0 - 10.5 K/uL    RBC 3.34 (L) 3.87 - 5.11 MIL/uL    Hemoglobin 10.1 (L) 12.0 - 15.0 g/dL    HCT 31.9 (L) 36 - 46 %    MCV 95.5 80.0 - 100.0 fL    MCH 30.2 26.0 - 34.0 pg    MCHC 31.7 30.0 - 36.0 g/dL    RDW 13.7 11.5 - 15.5 %    Platelets 335 150 - 400 K/uL    nRBC 0.0 0.0 - 0.2 %      Comment: Performed at [facility], [place] [address], [place], [place] [address]  Basic metabolic panel     Status: Abnormal    Collection Time: 04/16/20  1:24 AM  Result Value Ref Range    Sodium 142 135 - 145 mmol/L    Potassium 3.8 3.5 - 5.1 mmol/L    Chloride 104 98 - 111 mmol/L    CO2 29 22 - 32 mmol/L    Glucose, Bld 119 (H) 70 - 99 mg/dL      Comment: Glucose reference range applies only to samples taken after fasting for at least 8 hours.    BUN 11 8 - 23 mg/dL    Creatinine, Ser 0.35 (L) 0.44 - 1.00 mg/dL    Calcium 9.0 8.9 - 10.3 mg/dL    GFR calc non Af Amer >60 >60 mL/min    GFR calc Af Amer >60 >60 mL/min    Anion gap 9 5 - 15      Comment: Performed at [facility] [address], [place], [place] [address]  Glucose, capillary     Status: Abnormal    Collection Time: 04/16/20  3:29 AM  Result Value Ref Range     Glucose-Capillary 139 (H) 70 - 99 mg/dL      Comment: Glucose reference range applies only to samples taken after fasting for at least 8 hours.  Glucose, capillary     Status: Abnormal    Collection Time: 04/16/20  7:16 AM  Result Value Ref Range    Glucose-Capillary 117 (H) 70 - 99 mg/dL      Comment: Glucose reference range applies only to samples taken after fasting for at least 8 hours.       Imaging Results (Last 48 hours)  DG CHEST PORT 1 VIEW   Result Date: 04/16/2020 CLINICAL DATA:  79 year old female with history of shortness of breath. EXAM: PORTABLE CHEST 1 VIEW COMPARISON:  Chest x-ray 04/12/2020. FINDINGS: A feeding tube is seen extending into the abdomen, however, the tip of the feeding tube extends below the lower margin of the image.  Right internal jugular central venous catheter with tip terminating in the mid superior vena cava. Left-sided pacemaker device in place with lead tips projecting over the expected location of the right atrium and right ventricle. Lung volumes are low, but have increased compared to the prior study. There continues to be some bibasilar opacities (left greater than right), improved compared to the prior study, compatible with resolving areas of atelectasis and/or consolidation in the lower lobes. No definite pleural effusions. No evidence of pulmonary edema. No pneumothorax. Heart size is normal. Upper mediastinal contours are within normal limits. Aortic atherosclerosis. IMPRESSION: 1. Support apparatus, as above. 2. Persistently low but increasing lung volumes with improving bibasilar areas of atelectasis and/or consolidation (left greater than right). 3. Aortic atherosclerosis. Electronically Signed   By: Vinnie Langton M.D.   On: 04/16/2020 06:27    DG Swallowing Func-Speech Pathology   Result Date: 04/15/2020 Objective Swallowing Evaluation: Type of Study: MBS-Modified Barium Swallow Study  Patient Details Name: Alison Huffman MRN: [id number]  Date of Birth: 07/21/1941 Today's Date: 04/15/2020 Time: SLP Start Time (ACUTE ONLY): 1030 -SLP Stop Time (ACUTE ONLY): 1050 SLP Time Calculation (min) (ACUTE ONLY): 20 min Past Medical History: Past Medical History: Diagnosis Date . Ankle pain, right  . Atypical chest pain   a. 10/2018 MV: EF 57%. No ischemia/infarct. . Coronary artery disease  . Diastolic dysfunction   a. 04/2018 Echo: EF 55-60%, no rwma, Gr1 DD. Triv AI, mild MR. Mod dil LA. Nl RV fxn. PASP nl. . GERD (gastroesophageal reflux disease)  . Headache   "dull one sometimes daily, at least weekly in last couple months" (07/22/2018) . Hyperlipidemia  . Hypertension  . 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 Joint pain in fingers of right hand  . Lichen sclerosus  . Osteopenia  . Pneumonia 2012? X 1 . 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 Psoriasis  . Psoriatic arthritis ([place])   " dx'd the 1st of this year, 2019" (07/22/2018) . Psoriatic arthritis ([place])  . Rosacea  . Seborrheic keratosis  . Second degree heart block   a. 07/2018 s/p SJM Assurity MRI model PM2271 (Ser# [id number]). . Sleep apnea   "using nasal pilllows since ~ 12/2017" (07/22/2018) . TMJ (dislocation of temporomandibular joint)  Past Surgical History: Past Surgical History: Procedure Laterality Date . APPENDECTOMY   . BREAST CYST ASPIRATION Bilateral  . BREAST CYST EXCISION Right 1978  benign . COLONOSCOPY   . COLONOSCOPY WITH PROPOFOL N/A 07/10/2019  Procedure: COLONOSCOPY WITH PROPOFOL;  Surgeon: Lollie Sails, MD;  Location: [facility] ENDOSCOPY;  Service: Endoscopy;  Laterality: N/A; . ESOPHAGOGASTRODUODENOSCOPY (EGD) WITH PROPOFOL N/A 07/10/2019  Procedure: ESOPHAGOGASTRODUODENOSCOPY (EGD) WITH PROPOFOL;  Surgeon: Lollie Sails, MD;  Location: [facility] ENDOSCOPY;  Service: Endoscopy;  Laterality: N/A; . HYSTEROSCOPY WITH D & C N/A 11/05/2018  Procedure: DILATATION AND CURETTAGE /HYSTEROSCOPY;  Surgeon: Homero Fellers, MD;  Location: [facility] ORS;  Service: Gynecology;  Laterality: N/A;  . INSERT / REPLACE / REMOVE PACEMAKER   . PACEMAKER IMPLANT N/A 07/23/2018  SJM Assurity [id number] implanted by Dr Rayann Heman for mobitz II second degree AV block . UTERINE POLYPS REMOVAL   HPI: Pt is an 79 yo female with presumed [place]. She is a former smoker transferred to [facility] from [place] on 6/26 with weakness, numbness, and tingling after being started on Enbrel for psoriasis. Patient developed severe hyponatremia, had a seizure and was transferred to [facility] on 7/03 for further neurologic assessment.  Required intubation  for airway protection on 7/3 and waws extubated on 7/11 but has needed biPAP intermittently for respiratory distress. Pt became unresponsive during MRI on 7/6 and had code blue; sustained bilateral pneumothoraces after bag ventilation for which bilateral chest tubes were placed. MRI was negative for acute changes. Patient started on PLEX plasmaphoresis 7/7. 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 Teaspoons of thin, nectar, honey and puree were given. Pt had silent aspiration of thin liquids before the swallow and after. However, across textures use of a supraglottic swallow, swallow x2 and throat clear/expectoration effort after every 2-3 sips aided in early, more effortful airway protection and removal of pharyngeal residue present post swallow. Presence of COrtrak was clearly inhibiting epiglottic deflection, but pt is not ready to rely on oral intake for nutrition and medication at this time given silent nature of aspiration, weakness and muscle fatigue. However, pt is capable of taking  teaspoons of thin or nectar thick water or ice after oral care with staff. Pt must be instructed to hold the liquid in her mouth, hold her breath and swallow hard and then swallow again. After every 2-3 sips she should clear her throat and espectorate residue. Pt demosntrated competency with these strategies over 5 trials with only one verbal cue. Will f/u for subjective improvement and readiness for re-test.  SLP Visit Diagnosis Dysphagia, pharyngeal phase (R13.13) Attention and concentration deficit following -- Frontal lobe and executive function deficit following -- Impact on safety and function Moderate aspiration risk;Severe aspiration risk   CHL IP TREATMENT RECOMMENDATION 04/15/2020 Treatment Recommendations Therapy as outlined in treatment plan below   Prognosis 04/15/2020 Prognosis for Safe Diet Advancement Good Barriers to Reach Goals -- Barriers/Prognosis Comment -- CHL IP DIET RECOMMENDATION 04/15/2020 SLP Diet Recommendations Ice chips PRN after oral care;Free water protocol after oral care Liquid Administration via Spoon Medication Administration Via alternative means Compensations Effortful swallow;Multiple dry swallows after each bite/sip Postural Changes --   No flowsheet data found.  CHL IP FOLLOW UP RECOMMENDATIONS 04/15/2020 Follow up Recommendations Inpatient Rehab   CHL IP FREQUENCY AND DURATION 04/15/2020 Speech Therapy Frequency (ACUTE ONLY) min 2x/week Treatment Duration 2 weeks      CHL IP ORAL PHASE 04/15/2020 Oral Phase WFL Oral - Pudding Teaspoon -- Oral - Pudding Cup -- Oral - Honey Teaspoon -- Oral - Honey Cup -- Oral - Nectar Teaspoon -- Oral - Nectar Cup -- Oral - Nectar Straw -- Oral - Thin Teaspoon -- Oral - Thin Cup -- Oral - Thin Straw -- Oral - Puree -- Oral - Mech Soft -- Oral - Regular -- Oral - Multi-Consistency -- Oral - Pill -- Oral Phase - Comment --  CHL IP PHARYNGEAL PHASE 04/15/2020 Pharyngeal Phase Impaired Pharyngeal- Pudding Teaspoon -- Pharyngeal -- Pharyngeal- Pudding  Cup -- Pharyngeal -- Pharyngeal- Honey Teaspoon Reduced epiglottic inversion;Reduced tongue base retraction;Reduced anterior laryngeal mobility;Reduced pharyngeal peristalsis;Pharyngeal residue - valleculae;Pharyngeal residue - pyriform Pharyngeal -- Pharyngeal- Honey Cup -- Pharyngeal -- Pharyngeal- Nectar Teaspoon Reduced epiglottic inversion;Reduced tongue base retraction;Reduced anterior laryngeal mobility;Reduced pharyngeal peristalsis;Pharyngeal residue - valleculae;Pharyngeal residue - pyriform;Penetration/Aspiration before swallow;Penetration/Apiration after swallow;Trace aspiration Pharyngeal Material enters airway, passes BELOW cords without attempt by patient to eject out (silent aspiration);Material enters airway, CONTACTS cords and then  ejected out Pharyngeal- Nectar Cup -- Pharyngeal -- Pharyngeal- Nectar Straw -- Pharyngeal -- Pharyngeal- Thin Teaspoon Reduced epiglottic inversion;Reduced tongue base retraction;Reduced anterior laryngeal mobility;Reduced pharyngeal peristalsis;Pharyngeal residue - valleculae;Pharyngeal residue - pyriform;Penetration/Aspiration before swallow;Penetration/Apiration after swallow;Trace aspiration Pharyngeal Material enters airway, passes BELOW cords without attempt by patient to eject out (silent aspiration);Material enters airway, CONTACTS cords and not ejected out;Material enters airway, CONTACTS cords and then ejected out Pharyngeal- Thin Cup -- Pharyngeal -- Pharyngeal- Thin Straw -- Pharyngeal -- Pharyngeal- Puree Reduced epiglottic inversion;Reduced tongue base retraction;Reduced anterior laryngeal mobility;Reduced pharyngeal peristalsis;Pharyngeal residue - valleculae;Pharyngeal residue - pyriform Pharyngeal -- Pharyngeal- Mechanical Soft -- Pharyngeal -- Pharyngeal- Regular -- Pharyngeal -- Pharyngeal- Multi-consistency -- Pharyngeal -- Pharyngeal- Pill -- Pharyngeal -- Pharyngeal Comment --  No flowsheet data found. DeBlois, Katherene Ponto 04/15/2020, 1:30 PM                         Medical Problem List and Plan: 1.  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  Antithrombotics: -DVT/anticoagulation:  Pharmaceutical: Lovenox             -antiplatelet therapy: n/a 3. Pain Management: tylenol prn             -pt with minimal pain at present 4. Mood: patient appears up beat and motivated             -team to provide ego support as needed             -antipsychotic agents: n/a 5. Neuropsych: This patient is capable of making decisions on her own behalf. 6. Skin/Wound Care:              -Stage II sacral wound                         -pressure relief                         -local care, maximize nutrition 7. Fluids/Electrolytes/Nutrition: encourage PO             -recent hyponatremia--resolved             -check labs tomorrow 8. Oropharyngeal dysphagia:             -continue NPO with trials with SLP             -TF and freewater via NGT 9. Staph Aureus pulmonary infiltrates/acute respiratory failure             -continue ancef through 7/19             -continue O2 via [place] at 2L, wean to off as tolerated 10. Atypical GBS             -pt failed IVIG, s/p PLEX 5/5             -outpt EMG/NCS, neuro follow up recommended 11. Elevated LFT's             -U/S 7/12 with sludge, borderline hepatic steatosis             -f/u U/S in one year 12. Neurogenic bladder             -  discontinue foley in AM             -voiding trial 13. Hx of psoriasis             -holding enbrel as there is concern that this might have been a contributing factor to her syndrome 14. Recent seizures             -resolved             -secondary to hyponatremia                      Bary Leriche, PA-C 04/16/2020  I have personally performed a face to face diagnostic evaluation of this patient and formulated the  key components of the plan.  Additionally, I have personally reviewed laboratory data, imaging studies, as well as relevant notes and concur with the physician assistant's documentation above.  The patient's status has not changed from the original H&P.  Any changes in documentation from the acute care chart have been noted above.  Meredith Staggers, MD, Mellody Drown

## 2020-04-18 NOTE — Discharge Summary (Signed)
Physician Discharge Summary  Alison Huffman VQM:[id number] DOB: 25-Nov-1940 DOA: 04/03/2020  PCP: Leone Haven, MD  Admit date: 04/03/2020 Discharge date: 04/18/2020  Admitted From: home Discharge disposition: CIR     Discharge Diagnosis:   Active Problems:   Acute on chronic respiratory failure with hypoxemia (HCC)   Weakness   Pressure injury of skin   Guillain-Barre (HCC)   Respiratory failure (HCC)   Benign essential HTN   Chronic diastolic congestive heart failure ([place])   Coronary artery disease involving native coronary artery of native heart without angina pectoris    Discharge Condition: Improved.  Diet recommendation: tube feeds   Code status: Full.   History of Present Illness:   79 yo female former smoker transferred to [facility] from [facility] [place] on 6/26 with weakness, numbness, and tingling after being started on Enbrel for psoriasis. Patient developed severe hyponatremia, had a seizure and was transferred to [facility] on 7/03 for further neurologic assessment. Required intubation for airway protection and transferred to ICU. 7/06 patient had code blue and sustained bilateral pneumothoraces after bag ventilation for which bilateral chest tubes were placed. Patient started on PLEX 7/7 per neuro recommendation. Patient extubated on 7/11 and weaning HFNC. Tx from PCCM to [facility] on 7/17.   Hospital Course by Problem:  Acute hypoxic respiratory failure with compromised airway 2nd to seizure in setting of hyponatremia and progressive weakness -extubated and O2 being weaned down-- currently on 2L [place]  MSStaph Aureus pulmonary infiltrates with stridor(resolved stridor) -ancef through 7/19 -WBCs trending down  Hypernatremia,resolved - fwf 82m q4 - follow daily BMP   Numbness/weakness progressing from lower extremities up with a working diagnosis ofatypicalGBS, failed IVIG -s/p PLEX 5/5 -neurology consult  appreciated -SLP following for possible diet order, continue tube feeds until then -CIR bed   Elevated Alk Phos, resolved Alk Pho, AST and ALT all down trending. RUQ ultrasound on 7/12 with gallbladder sludge, borderline hepatic steatosis, recommending repeat RUQ ultrasound in 1 year.  - recommend outpatient follow up  Urinary retention:  Keep Foley -voiding trial when more mobile  Hx of psoriasis. Hold Enbrel, some question as to whether this could have been a contributing factor  Pressure Injury 04/03/20 Sacrum Stage 2 -  Partial thickness loss of dermis presenting as a shallow open injury with a red, pink wound bed without slough. (Active)  04/03/20 0300  Location: Sacrum  Location Orientation:   Staging: Stage 2 -  Partial thickness loss of dermis presenting as a shallow open injury with a red, pink wound bed without slough.  Wound Description (Comments):   Present on Admission: Yes      Resolved Hospital Problem list  Requirement for vasopressors  Bilateral pneumothoraces status post intubation resolved Hyponatremia  Seizures secondary to hyponatremia     Medical Consultants:    PCCM neuro  Discharge Exam:   Vitals:   04/18/20 0600 04/18/20 0752  BP:  136/65  Pulse: 88 89  Resp: 16 16  Temp:  98.3 F (36.8 C)  SpO2: 100% 98%   Vitals:   04/18/20 0001 04/18/20 0341 04/18/20 0600 04/18/20 0752  BP: 138/87 134/66  136/65  Pulse: 89 89 88 89  Resp: 17 18 16 16   Temp: 97.8 F (36.6 C) 98.1 F (36.7 C)  98.3 F (36.8 C)  TempSrc: Oral Oral  Oral  SpO2: 100% 100% 100% 98%  Weight:  72.7 kg      General exam: Appears calm  and comfortable. Voice stronger today   The results of significant diagnostics from this hospitalization (including imaging, microbiology, ancillary and laboratory) are listed below for reference.     Procedures and Diagnostic Studies:   DG Chest Port 1 View  Result Date: 04/04/2020 CLINICAL DATA:  Shortness of  breath EXAM: PORTABLE CHEST 1 VIEW COMPARISON:  April 03, 2020 FINDINGS: The mediastinal contour and cardiac silhouette are stable. Endotracheal tube is identified with distal tip 4.7 cm from carina. Nasogastric tube is identified with distal tip not included on film but is at least in the stomach. Cardiac pacemaker is unchanged. Linear opacities are identified in bilateral lung bases. Probable small left pleural effusion is identified. The bony structures are stable. IMPRESSION: Linear opacities in bilateral lung bases, this may be secondary to atelectasis but underlying pneumonia is not excluded. Electronically Signed   By: Abelardo Diesel M.D.   On: 04/04/2020 07:56   DG CHEST PORT 1 VIEW  Result Date: 04/03/2020 CLINICAL DATA:  Shortness of breath EXAM: PORTABLE CHEST 1 VIEW COMPARISON:  Radiograph 03/29/2020 FINDINGS: Endotracheal tube low within the trachea, 2.9 cm from the carina. Pacer pack overlies the left chest wall with leads directed towards the right atrium and cardiac apex. Telemetry leads overlie the chest. Additional support devices overlie the right shoulder. Increasing patchy opacities are seen in the infrahilar lungs with some central vascular congestion. No pneumothorax or visible pleural effusion. Cardiomediastinal contours are stable from prior accounting for differences in technique with a calcified aorta. No acute osseous or soft tissue abnormality. Degenerative changes are present in the imaged spine and shoulders. IMPRESSION: 1. Endotracheal tube low within the trachea, 2.9 cm from the carina. Could consider retraction 1-2 cm to the mid trachea. 2. Increasing patchy opacities in the infrahilar lungs with some central vascular congestion, could reflect developing edema or infection. Electronically Signed   By: Lovena Le M.D.   On: 04/03/2020 03:19   DG Abd Portable 1V  Result Date: 04/03/2020 CLINICAL DATA:  Initial evaluation for enteric tube placement. EXAM: PORTABLE ABDOMEN - 1 VIEW  COMPARISON:  None. FINDINGS: Enteric tube in place with tip seen overlying the first-second portion of the duodenum, side hole well beyond the GE junction at the level of the distal stomach/pylorus. Multiple scattered gas-filled loops of bowel noted within the visualized abdomen. Thoracolumbar scoliosis noted. IMPRESSION: Tip of enteric tube overlying the first-second portion of the duodenum, side hole well beyond the GE junction at the level of the distal stomach/pylorus. Electronically Signed   By: Jeannine Boga M.D.   On: 04/03/2020 04:53   EEG adult  Result Date: 04/03/2020 Philemon Kingdom, MD     04/03/2020  7:31 PM Date of Study: 04/03/2020 Reason for Study:  Pt is a 79 YOF with PMH of HTN, HLD, CAD, PSORIASIS/ARTHRITIS who has developed weakness/numbness/tingling, hyponatremia and had possible seizure like event.  Eval for seizures. Description of recording: This recording was obtained using a Digital EEG system with 18 channel capacity . Standard bipolar and referential EEG montages were used following the International 10 - 20 System . This is a digitally recorded EEG with duration of approximately ~23:53 minutes. The background consists of 4-6 hz delta/theta rhythm throughout study.  There were no epileptiform discharges or electrographic seizures noted. Hyperventilation was not performed. Photic stimulation was not performed. Sleep did not occur in this recording. IMPRESSION: This is an abnormal EEG for age due to diffuse background slowing which can be seen with toxic/metabolic encephalopathy, cerebral  ischemia, intracerebral hemorrhage, tumors, traumatic brain injury, cortical malformations, or other cerebral dysfunction. There were no clinical seizures or epileptiform discharges noted during study. Clinical correlation is advised. I have personally reviewed this study.    Labs:   Basic Metabolic Panel: Recent Labs  Lab 04/11/20 1326 04/11/20 2115 04/13/20 0421 04/13/20 0421  04/14/20 0518 04/14/20 0518 04/15/20 0404 04/15/20 0404 04/16/20 0124 04/17/20 1522  NA 140   < > 146*  --  145  --  144  --  142 137  K 3.4*   < > 3.8   < > 4.1   < > 3.8   < > 3.8 4.5  CL 105   < > 112*  --  110  --  110  --  104 102  CO2 25   < > 25  --  27  --  26  --  29 25  GLUCOSE 135*   < > 183*  --  212*  --  108*  --  119* 136*  BUN 13   < > 24*  --  20  --  16  --  11 11  CREATININE 0.38*   < > 0.45  --  0.40*  --  0.33*  --  0.35* 0.37*  CALCIUM 9.0   < > 9.1  --  8.8*  --  8.7*  --  9.0 8.9  MG 1.9  --   --   --   --   --   --   --   --   --    < > = values in this interval not displayed.   GFR Estimated Creatinine Clearance: 57.3 mL/min (A) (by C-G formula based on SCr of 0.37 mg/dL (L)). Liver Function Tests: Recent Labs  Lab 04/12/20 0552 04/13/20 0421 04/14/20 0518  AST 48* 60* 37  ALT 62* 54* 58*  ALKPHOS 150* 71 72  BILITOT 1.0 1.1 0.6  PROT 6.3* 5.4* 5.5*  ALBUMIN 3.6 3.8 3.5   No results for input(s): LIPASE, AMYLASE in the last 168 hours. No results for input(s): AMMONIA in the last 168 hours. Coagulation profile No results for input(s): INR, PROTIME in the last 168 hours.  CBC: Recent Labs  Lab 04/12/20 0552 04/13/20 0421 04/14/20 0518 04/15/20 0404 04/16/20 0124  WBC 14.7* 15.1* 14.0* 13.9* 11.9*  HGB 10.5* 10.1* 10.0* 10.5* 10.1*  HCT 33.3* 32.7* 31.8* 33.6* 31.9*  MCV 94.3 96.2 95.2 96.6 95.5  PLT 279 252 308 343 335   Cardiac Enzymes: No results for input(s): CKTOTAL, CKMB, CKMBINDEX, TROPONINI in the last 168 hours. BNP: Invalid input(s): POCBNP CBG: Recent Labs  Lab 04/17/20 1146 04/17/20 1541 04/17/20 2355 04/18/20 0340 04/18/20 0751  GLUCAP 119* 131* 134* 114* 114*   D-Dimer No results for input(s): DDIMER in the last 72 hours. Hgb A1c No results for input(s): HGBA1C in the last 72 hours. Lipid Profile No results for input(s): CHOL, HDL, LDLCALC, TRIG, CHOLHDL, LDLDIRECT in the last 72 hours. Thyroid function  studies No results for input(s): TSH, T4TOTAL, T3FREE, THYROIDAB in the last 72 hours.  Invalid input(s): FREET3 Anemia work up No results for input(s): VITAMINB12, FOLATE, FERRITIN, TIBC, IRON, RETICCTPCT in the last 72 hours. Microbiology Recent Results (from the past 240 hour(s))  Culture, respiratory (non-expectorated)     Status: None   Collection Time: 04/11/20  9:15 AM   Specimen: Tracheal Aspirate; Respiratory  Result Value Ref Range Status   Specimen Description TRACHEAL ASPIRATE  Final  Special Requests NONE  Final   Gram Stain   Final    RARE WBC PRESENT,BOTH PMN AND MONONUCLEAR FEW GRAM POSITIVE COCCI IN PAIRS Performed at [facility], [place] [address] [place], [place] [address]    Culture MODERATE STAPHYLOCOCCUS AUREUS  Final   Report Status 04/13/2020 FINAL  Final   Organism ID, Bacteria STAPHYLOCOCCUS AUREUS  Final      Susceptibility   Staphylococcus aureus - MIC*    CIPROFLOXACIN <=0.5 SENSITIVE Sensitive     ERYTHROMYCIN >=8 RESISTANT Resistant     GENTAMICIN <=0.5 SENSITIVE Sensitive     OXACILLIN 0.5 SENSITIVE Sensitive     TETRACYCLINE <=1 SENSITIVE Sensitive     VANCOMYCIN <=0.5 SENSITIVE Sensitive     TRIMETH/SULFA <=10 SENSITIVE Sensitive     CLINDAMYCIN <=0.25 SENSITIVE Sensitive     RIFAMPIN <=0.5 SENSITIVE Sensitive     Inducible Clindamycin NEGATIVE Sensitive     * MODERATE STAPHYLOCOCCUS AUREUS     Discharge Instructions:   Discharge Instructions    No wound care   Complete by: As directed      Allergies as of 04/18/2020      Reactions   Nickel       Medication List    STOP taking these medications   CITRUCEL PO   Enbrel 50 MG/ML injection Generic drug: etanercept   famotidine 20 MG tablet Commonly known as: PEPCID   rosuvastatin 5 MG tablet Commonly known as: CRESTOR   valsartan 80 MG tablet Commonly known as: DIOVAN     TAKE these medications   acetaminophen 325 MG tablet Commonly known as: TYLENOL Take  325-650 mg by mouth every 6 (six) hours as needed for mild pain or headache.   bisacodyl 10 MG suppository Commonly known as: DULCOLAX Place 1 suppository (10 mg total) rectally daily as needed for moderate constipation.   ceFAZolin 2-4 GM/100ML-% IVPB Commonly known as: ANCEF Inject 100 mLs (2 g total) into the vein every 8 (eight) hours.   chlorhexidine 0.12 % solution Commonly known as: PERIDEX 15 mLs by Mouth Rinse route 2 (two) times daily.   Clobetasol Propionate E 0.05 % emollient cream Generic drug: Clobetasol Prop Emollient Base Apply 1 application topically every Sunday.   desonide 0.05 % ointment Commonly known as: DESOWEN Apply 1 application topically 2 (two) times daily as needed (for psoriasis).   diphenhydrAMINE 25 mg capsule Commonly known as: BENADRYL Take 1 capsule (25 mg total) by mouth every 6 (six) hours as needed for itching ( or hives).   enoxaparin 40 MG/0.4ML injection Commonly known as: LOVENOX Inject 0.4 mLs (40 mg total) into the skin daily.   feeding supplement (OSMOLITE 1.2 CAL) Liqd Place 1,000 mLs into feeding tube continuous.   free water Soln Place 30 mLs into feeding tube every 4 (four) hours.   guaiFENesin 100 MG/5ML Soln Commonly known as: ROBITUSSIN Place 5 mLs (100 mg total) into feeding tube every 4 (four) hours as needed for cough or to loosen phlegm.   insulin aspart 100 UNIT/ML injection Commonly known as: novoLOG Inject 0-15 Units into the skin every 4 (four) hours.   ipratropium-albuterol 0.5-2.5 (3) MG/3ML Soln Commonly known as: DUONEB Take 3 mLs by nebulization every 6 (six) hours as needed.   metroNIDAZOLE 0.75 % cream Commonly known as: METROCREAM Apply topically 2 (two) times daily as needed (facial redness/irritation.).   mouth rinse Liqd solution 15 mLs by Mouth Rinse route 2 times daily at 12 noon and 4 pm.  nystatin cream Commonly known as: MYCOSTATIN Apply 1 application topically 2 (two) times daily as  needed for dry skin.   ondansetron 4 MG tablet Commonly known as: ZOFRAN Take 1 tablet (4 mg total) by mouth every 6 (six) hours as needed for nausea.   pantoprazole sodium 40 mg/20 mL Pack Commonly known as: PROTONIX Place 20 mLs (40 mg total) into feeding tube daily. Start taking on: April 19, 2020   polyethylene glycol 17 g packet Commonly known as: MIRALAX / GLYCOLAX Take 17 g by mouth daily as needed for severe constipation.   tacrolimus 0.1 % ointment Commonly known as: PROTOPIC Apply topically 2 (two) times daily as needed (for psoriasis).         Time coordinating discharge: 45 min  Signed:  Geradine Girt DO  [facility] 04/18/2020, 9:36 AM

## 2020-04-18 NOTE — Progress Notes (Signed)
Inpatient Rehab Admissions Coordinator:   I have a CIR bed available for this patient today. Will plan to admit today once d/c orders are entered.   Clemens Catholic, [place], [place] Admissions Coordinator  [phone] ([place]) [phone] (office)

## 2020-04-18 NOTE — Progress Notes (Signed)
Inpatient Rehabilitation Medication Review by a Pharmacist  A complete drug regimen review was completed for this patient to identify any potential clinically significant medication issues.  Clinically significant medication issues were identified:  no   Type of Medication Issue Identified Description of Issue Plan Plan Accepted by Provider? (Yes / No)  Drug Interaction(s) (clinically significant)      Duplicate Therapy      Allergy      No Medication Administration End Date      Incorrect Dose      Additional Drug Therapy Needed      Other  PTA prn creams are appropriately held from inpatient admission to admission to rehab.  Patient may resume as needed at upon DC      Name of provider notified for issues identified:   Provider Method of Notification:    Pharmacist comments:   Time spent performing this drug regimen review (minutes):  56min  Bonnita Nasuti Pharm.D. CPP, BCPS Clinical Pharmacist [phone] 04/18/2020 2:40 PM

## 2020-04-19 ENCOUNTER — Inpatient Hospital Stay (HOSPITAL_COMMUNITY): Payer: Medicare Other | Admitting: Speech Pathology

## 2020-04-19 ENCOUNTER — Inpatient Hospital Stay (HOSPITAL_COMMUNITY): Payer: Medicare Other | Admitting: Occupational Therapy

## 2020-04-19 ENCOUNTER — Inpatient Hospital Stay (HOSPITAL_COMMUNITY): Payer: Medicare Other | Admitting: Physical Therapy

## 2020-04-19 DIAGNOSIS — E440 Moderate protein-calorie malnutrition: Secondary | ICD-10-CM | POA: Insufficient documentation

## 2020-04-19 LAB — CBC WITH DIFFERENTIAL/PLATELET
Abs Immature Granulocytes: 0.05 10*3/uL (ref 0.00–0.07)
Basophils Absolute: 0 10*3/uL (ref 0.0–0.1)
Basophils Relative: 0 %
Eosinophils Absolute: 0.2 10*3/uL (ref 0.0–0.5)
Eosinophils Relative: 2 %
HCT: 35 % — ABNORMAL LOW (ref 36.0–46.0)
Hemoglobin: 11.1 g/dL — ABNORMAL LOW (ref 12.0–15.0)
Immature Granulocytes: 1 %
Lymphocytes Relative: 12 %
Lymphs Abs: 1.3 10*3/uL (ref 0.7–4.0)
MCH: 30.1 pg (ref 26.0–34.0)
MCHC: 31.7 g/dL (ref 30.0–36.0)
MCV: 94.9 fL (ref 80.0–100.0)
Monocytes Absolute: 0.5 10*3/uL (ref 0.1–1.0)
Monocytes Relative: 5 %
Neutro Abs: 8.7 10*3/uL — ABNORMAL HIGH (ref 1.7–7.7)
Neutrophils Relative %: 80 %
Platelets: 405 10*3/uL — ABNORMAL HIGH (ref 150–400)
RBC: 3.69 MIL/uL — ABNORMAL LOW (ref 3.87–5.11)
RDW: 13.8 % (ref 11.5–15.5)
WBC: 10.7 10*3/uL — ABNORMAL HIGH (ref 4.0–10.5)
nRBC: 0 % (ref 0.0–0.2)

## 2020-04-19 LAB — GLUCOSE, CAPILLARY
Glucose-Capillary: 112 mg/dL — ABNORMAL HIGH (ref 70–99)
Glucose-Capillary: 124 mg/dL — ABNORMAL HIGH (ref 70–99)
Glucose-Capillary: 131 mg/dL — ABNORMAL HIGH (ref 70–99)
Glucose-Capillary: 135 mg/dL — ABNORMAL HIGH (ref 70–99)
Glucose-Capillary: 116 mg/dL — ABNORMAL HIGH (ref 70–99)

## 2020-04-19 LAB — COMPREHENSIVE METABOLIC PANEL
ALT: 20 U/L (ref 0–44)
AST: 21 U/L (ref 15–41)
Albumin: 3.4 g/dL — ABNORMAL LOW (ref 3.5–5.0)
Alkaline Phosphatase: 76 U/L (ref 38–126)
Anion gap: 7 (ref 5–15)
BUN: 16 mg/dL (ref 8–23)
CO2: 29 mmol/L (ref 22–32)
Calcium: 9.4 mg/dL (ref 8.9–10.3)
Chloride: 102 mmol/L (ref 98–111)
Creatinine, Ser: 0.46 mg/dL (ref 0.44–1.00)
GFR calc Af Amer: >60 mL/min (ref >60)
GFR calc non Af Amer: >60 mL/min (ref >60)
Glucose, Bld: 125 mg/dL — ABNORMAL HIGH (ref 70–99)
Potassium: 4.5 mmol/L (ref 3.5–5.1)
Sodium: 138 mmol/L (ref 135–145)
Total Bilirubin: 0.4 mg/dL (ref 0.3–1.2)
Total Protein: 6.2 g/dL — ABNORMAL LOW (ref 6.5–8.1)

## 2020-04-19 MED ORDER — OSMOLITE 1.2 CAL PO LIQD
1000.0000 mL | ORAL | Status: DC
  Administered 2020-04-19 – 2020-04-21 (×3): 1000 mL
  Filled 2020-04-19 (×6): qty 1000

## 2020-04-19 NOTE — Plan of Care (Signed)
Problem: RH Balance Goal: LTG: Patient will maintain dynamic sitting balance (OT) Description: LTG:  Patient will maintain dynamic sitting balance with assistance during activities of daily living (OT) Flowsheets (Taken 04/19/2020 1300) LTG: Pt will maintain dynamic sitting balance during ADLs with: Supervision/Verbal cueing Goal: LTG Patient will maintain dynamic standing with ADLs (OT) Description: LTG:  Patient will maintain dynamic standing balance with assist during activities of daily living (OT)  Flowsheets (Taken 04/19/2020 1300) LTG: Pt will maintain dynamic standing balance during ADLs with: Contact Guard/Touching assist   Problem: Sit to Stand Goal: LTG:  Patient will perform sit to stand in prep for activites of daily living with assistance level (OT) Description: LTG:  Patient will perform sit to stand in prep for activites of daily living with assistance level (OT) Flowsheets (Taken 04/19/2020 1300) LTG: PT will perform sit to stand in prep for activites of daily living with assistance level: Minimal Assistance - Patient > 75%   Problem: RH Eating Goal: LTG Patient will perform eating w/assist, cues/equip (OT) Description: LTG: Patient will perform eating with assist, with/without cues using equipment (OT) Flowsheets (Taken 04/19/2020 1300) LTG: Pt will perform eating with assistance level of: Supervision/Verbal cueing   Problem: RH Grooming Goal: LTG Patient will perform grooming w/assist,cues/equip (OT) Description: LTG: Patient will perform grooming with assist, with/without cues using equipment (OT) Flowsheets (Taken 04/19/2020 1300) LTG: Pt will perform grooming with assistance level of: Supervision/Verbal cueing   Problem: RH Bathing Goal: LTG Patient will bathe all body parts with assist levels (OT) Description: LTG: Patient will bathe all body parts with assist levels (OT) Flowsheets (Taken 04/19/2020 1300) LTG: Pt will perform bathing with assistance level/cueing:  Minimal Assistance - Patient > 75%   Problem: RH Dressing Goal: LTG Patient will perform upper body dressing (OT) Description: LTG Patient will perform upper body dressing with assist, with/without cues (OT). Flowsheets (Taken 04/19/2020 1300) LTG: Pt will perform upper body dressing with assistance level of: Supervision/Verbal cueing Goal: LTG Patient will perform lower body dressing w/assist (OT) Description: LTG: Patient will perform lower body dressing with assist, with/without cues in positioning using equipment (OT) Flowsheets (Taken 04/19/2020 1300) LTG: Pt will perform lower body dressing with assistance level of: Minimal Assistance - Patient > 75%   Problem: RH Toileting Goal: LTG Patient will perform toileting task (3/3 steps) with assistance level (OT) Description: LTG: Patient will perform toileting task (3/3 steps) with assistance level (OT)  Flowsheets (Taken 04/19/2020 1300) LTG: Pt will perform toileting task (3/3 steps) with assistance level: Minimal Assistance - Patient > 75%   Problem: RH Functional Use of Upper Extremity Goal: LTG Patient will use RT/LT upper extremity as a (OT) Description: LTG: Patient will use right/left upper extremity as a stabilizer/gross assist/diminished/nondominant/dominant level with assist, with/without cues during functional activity (OT) Flowsheets (Taken 04/19/2020 1300) LTG: Use of upper extremity in functional activities:  LUE as nondominant level  RUE as dominant level   Problem: RH Toilet Transfers Goal: LTG Patient will perform toilet transfers w/assist (OT) Description: LTG: Patient will perform toilet transfers with assist, with/without cues using equipment (OT) Flowsheets (Taken 04/19/2020 1300) LTG: Pt will perform toilet transfers with assistance level of: Minimal Assistance - Patient > 75%   Problem: RH Tub/Shower Transfers Goal: LTG Patient will perform tub/shower transfers w/assist (OT) Description: LTG: Patient will perform  tub/shower transfers with assist, with/without cues using equipment (OT) Flowsheets (Taken 04/19/2020 1300) LTG: Pt will perform tub/shower stall transfers with assistance level of: Minimal Assistance - Patient >  75%   

## 2020-04-19 NOTE — Plan of Care (Signed)
  Problem: RH BOWEL ELIMINATION Goal: RH STG MANAGE BOWEL WITH ASSISTANCE Description: STG Manage Bowel with max Assistance. Flowsheets (Taken 04/19/2020 1410) STG: Pt will manage bowels with assistance: 3-Moderate assistance Goal: RH STG MANAGE BOWEL W/MEDICATION W/ASSISTANCE Description: STG Manage Bowel with Medication with mod-max Assistance. Flowsheets (Taken 04/19/2020 1410) STG: Pt will manage bowels with medication with assistance: 3-Moderate assistance   Problem: RH BOWEL ELIMINATION Goal: RH STG MANAGE BOWEL WITH ASSISTANCE Description: STG Manage Bowel with max Assistance. Flowsheets (Taken 04/19/2020 1410) STG: Pt will manage bowels with assistance: 3-Moderate assistance Goal: RH STG MANAGE BOWEL W/MEDICATION W/ASSISTANCE Description: STG Manage Bowel with Medication with mod-max Assistance. Flowsheets (Taken 04/19/2020 1410) STG: Pt will manage bowels with medication with assistance: 3-Moderate assistance   Problem: RH BLADDER ELIMINATION Goal: RH STG MANAGE BLADDER WITH ASSISTANCE Description: STG Manage Bladder With  max Assistance Flowsheets (Taken 04/19/2020 1410) STG: Pt will manage bladder with assistance: 3-Moderate assistance Goal: RH STG MANAGE BLADDER WITH MEDICATION WITH ASSISTANCE Description: STG Manage Bladder With Medication With mod Assistance. Flowsheets (Taken 04/19/2020 1410) STG: Pt will manage bladder with medication with assistance: 3-Moderate assistance Goal: RH STG MANAGE BLADDER WITH EQUIPMENT WITH ASSISTANCE Description: STG Manage Bladder With Equipment With mod-max Assistance Flowsheets (Taken 04/19/2020 1410) STG: Pt will manage bladder with equipment with assistance: 3-Moderate assistance   Problem: RH SAFETY Goal: RH STG ADHERE TO SAFETY PRECAUTIONS W/ASSISTANCE/DEVICE Description: STG Adhere to Safety Precautions With moderate  Assistance/Device. Flowsheets (Taken 04/19/2020 1410) STG:Pt will adhere to safety precautions with  assistance/device: 4-Minimal assistance   Problem: RH PAIN MANAGEMENT Goal: RH STG PAIN MANAGED AT OR BELOW PT'S PAIN GOAL Description: Pain less than 2 Outcome: Progressing   Problem: RH KNOWLEDGE DEFICIT GENERAL Goal: RH STG INCREASE KNOWLEDGE OF SELF CARE AFTER HOSPITALIZATION Description: Moderate assistance re: self care after hospitalization Outcome: Progressing

## 2020-04-19 NOTE — Evaluation (Signed)
Occupational Therapy Assessment and Plan  Patient Details  Name: Alison Huffman MRN: [id number] Date of Birth: 12-26-1940  OT Diagnosis: ataxia, muscle weakness (generalized) and decreased endurance Rehab Potential: Rehab Potential (ACUTE ONLY): Good ELOS: 21-24 days   Today's Date: 04/19/2020 OT Individual Time: 9249-3241 OT Individual Time Calculation (min): 73 min     Hospital Problem: Principal Problem:   GBS (Guillain Barre syndrome) ([place])   Past Medical History:  Past Medical History:  Diagnosis Date  . Ankle pain, right   . Atypical chest pain    a. 10/2018 MV: EF 57%. No ischemia/infarct.  . Coronary artery disease   . Diastolic dysfunction    a. 04/2018 Echo: EF 55-60%, no rwma, Gr1 DD. Triv AI, mild MR. Mod dil LA. Nl RV fxn. PASP nl.  . GERD (gastroesophageal reflux disease)   . Headache    "dull one sometimes daily, at least weekly in last couple months" (07/22/2018)  . Hyperlipidemia   . Hypertension   . 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 Joint pain in fingers of right hand   . Lichen sclerosus   . Osteopenia   . Pneumonia 2012? X 1  . 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 Psoriasis   . Psoriatic arthritis ([place])    " dx'd the 1st of this year, 2019" (07/22/2018)  . Psoriatic arthritis ([place])   . Rosacea   . Seborrheic keratosis   . Second degree heart block    a. 07/2018 s/p SJM Assurity MRI model [id number] (Ser# [id number]).  . Sleep apnea    "using nasal pilllows since ~ 12/2017" (07/22/2018)  . TMJ (dislocation of temporomandibular joint)    Past Surgical History:  Past Surgical History:  Procedure Laterality Date  . APPENDECTOMY    . BREAST CYST ASPIRATION Bilateral   . BREAST CYST EXCISION Right 1978   benign  . COLONOSCOPY    . COLONOSCOPY WITH PROPOFOL N/A 07/10/2019   Procedure: COLONOSCOPY WITH PROPOFOL;  Surgeon: Lollie Sails, MD;  Location: [facility] ENDOSCOPY;  Service: Endoscopy;  Laterality: N/A;  . ESOPHAGOGASTRODUODENOSCOPY  (EGD) WITH PROPOFOL N/A 07/10/2019   Procedure: ESOPHAGOGASTRODUODENOSCOPY (EGD) WITH PROPOFOL;  Surgeon: Lollie Sails, MD;  Location: [facility] ENDOSCOPY;  Service: Endoscopy;  Laterality: N/A;  . HYSTEROSCOPY WITH D & C N/A 11/05/2018   Procedure: DILATATION AND CURETTAGE /HYSTEROSCOPY;  Surgeon: Homero Fellers, MD;  Location: [facility] ORS;  Service: Gynecology;  Laterality: N/A;  . INSERT / REPLACE / REMOVE PACEMAKER    . 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 UTERINE POLYPS REMOVAL      Assessment & Plan Clinical Impression: Patient is a 79 y.o. year old female with recent admission from  [facility] on [facility] due to progressive onset paresthesias in hands/feet, eventually with weakness now progressed to total body including head and trunk. Pt had seizure in setting of hyponatremia. 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 PMH: psoriatic arthritis, CAD, PPM, HTN.  Patient transferred to CIR on 04/18/2020 .    Patient currently requires Max/ total with basic self-care skills secondary to muscle weakness, decreased cardiorespiratoy endurance and decreased oxygen support, abnormal tone and decreased coordination and decreased sitting balance, decreased standing balance, decreased postural control and decreased balance strategies.  Prior to hospitalization, patient could complete BADL with independent .  Patient will benefit from skilled intervention to increase independence with basic self-care skills prior to discharge  SNF at [facility] facility.  Anticipate patient will require minimal physical assistance and follow up OT at SNF.  OT Assessment Rehab Potential (ACUTE ONLY): Good OT Patient demonstrates impairments in the following area(s): Balance;Endurance;Motor;Sensory OT Basic ADL's Functional Problem(s): Eating;Bathing;Grooming;Dressing;Toileting OT Transfers Functional Problem(s): Tub/Shower;Toilet OT  Additional Impairment(s): Fuctional Use of Upper Extremity OT Plan OT Intensity: Minimum of 1-2 x/day, 45 to 90 minutes OT Frequency: 5 out of 7 days OT Duration/Estimated Length of Stay: 21-24 days OT Treatment/Interventions: Medical illustrator training;Community reintegration;Discharge planning;DME/adaptive equipment instruction;Functional mobility training;Neuromuscular re-education;Patient/family education;Pain management;Psychosocial support;Self Care/advanced ADL retraining;Splinting/orthotics;Therapeutic Activities;Therapeutic Exercise;UE/LE Strength taining/ROM;UE/LE Coordination activities;Wheelchair propulsion/positioning OT Self Feeding Anticipated Outcome(s): Supervision OT Basic Self-Care Anticipated Outcome(s): Min A OT Toileting Anticipated Outcome(s): Min A OT Bathroom Transfers Anticipated Outcome(s): Min A OT Recommendation Patient destination: [facility] (SNF) (at [facility] at [place]) Follow Up Recommendations: Skilled nursing facility Equipment Recommended: To be determined   Skilled Therapeutic Intervention Pt greeted semi-reclined in bed and agreeable to OT treatment session. Nurse tech reported she had just changed pt from incontinent bowel and bladder. Pt on 2L of O2 throughout session. OT eval completed addressing rehab process, OT purpose, POC, ELOS, and goals.  Bed level LB bathing/dressing completed with total A, with pt able to roll L and R with mod/max A to pull pants up over hips. OT brought pt into sitting with max A of 1. Worked on sitting balance and sitting tolerance at EOB with pt initially needing min/mod A, fading to CGA and intermitent bouts of supervision. Pt needed more assist as she fatigued-up to mod A. UB bathing/dressing completed at EOB with mod A 2/2 UW weakness and ataxia, as well as needing increased sitting balance support with increased task demand. Pt tolerated sitting while nursing administered medications via feeding tube. Pt  reported max fatigue and was returned to bed with max A. Nurse tech entered and OT assisted with rolling for bed linen change and mod A for rolling. Pt left semi-reclined in bed with bed alarm on, call bell in reach, and needs met.   OT Evaluation Precautions/Restrictions  Precautions Precautions: Fall Precaution Comments: gross limb ataxia x4; significant weakness Restrictions Weight Bearing Restrictions: No Pain  Pt denies pain Home Living/Prior Functioning Home Living Family/patient expects to be discharged to:: Other (Comment) (Independent living) Living Arrangements: Alone Available Help at Discharge: Family, Friend(s) Type of Home: Apartment Home Access: Level entry Home Layout: One level Bathroom Shower/Tub: Multimedia programmer: Handicapped height Bathroom Accessibility: Yes Additional Comments: Independent Living Apt  Lives With: Alone IADL History Homemaking Responsibilities: Yes IADL Comments: Independent with all iADL, would go to the cafeteria at facility for one meal a day Prior Function Level of Independence: Independent with basic ADLs, Independent with homemaking with ambulation ADL ADL Eating: NPO Grooming: Moderate assistance Upper Body Bathing: Moderate assistance Lower Body Bathing: Dependent Where Assessed-Lower Body Bathing: Bed level Where Assessed-Upper Body Dressing: Edge of bed Lower Body Dressing: Maximal assistance Toileting: Unable to assess Toilet Transfer Method: Unable to assess Social research officer, government: Unable to assess Vision Baseline Vision/History: Wears glasses Wears Glasses: Distance only Patient Visual Report: No change from baseline Cognition Overall Cognitive Status: Within Functional Limits for tasks assessed Arousal/Alertness: Awake/alert Orientation Level: Person;Place;Situation Person: Oriented Place: Oriented Situation: Oriented Year: 2021 Month: July Day of Week: Correct Memory: Appears intact Immediate  Memory Recall: Sock;Blue;Bed Memory Recall Sock: Without Cue Memory Recall Blue: Without Cue Memory Recall Bed: Without Cue Awareness: Appears intact Problem Solving: Appears intact Safety/Judgment: Appears  intact Sensation Sensation Light Touch: Impaired by gross assessment Peripheral sensation comments: numbness and tingling in B LEs, pt noted none in hadns Light Touch Impaired Details: Impaired RLE;Impaired LLE Coordination Gross Motor Movements are Fluid and Coordinated: No Fine Motor Movements are Fluid and Coordinated: No Coordination and Movement Description: decreased smoothness and accuracy, ataxia x4 limbs and intermittent tremors Motor  Motor Motor: Ataxia Motor - Skilled Clinical Observations: generalized weakness and ataxia Balance Balance Balance Assessed: Yes Static Sitting Balance Static Sitting - Balance Support: Feet supported Static Sitting - Level of Assistance: 4: Min assist Dynamic Sitting Balance Dynamic Sitting - Balance Support: Feet supported Dynamic Sitting - Level of Assistance: 3: Mod assist Extremity/Trunk Assessment RUE Assessment RUE Assessment: Exceptions to [facility] Passive Range of Motion (PROM) Comments: WFL General Strength Comments: Generalized weakness 3/5 overall LUE Assessment LUE Assessment: Exceptions to [facility] Active Range of Motion (AROM) Comments: WFL General Strength Comments: Generalized weakness 3/5 overall    Refer to Care Plan for Long Term Goals  Recommendations for other services: Therapeutic Recreation  Stress management and Outing/community reintegration   Discharge Criteria: Patient will be discharged from OT if patient refuses treatment 3 consecutive times without medical reason, if treatment goals not met, if there is a change in medical status, if patient makes no progress towards goals or if patient is discharged from hospital.  The above assessment, treatment plan, treatment alternatives and goals were discussed and  mutually agreed upon: by patient  Valma Cava 04/19/2020, 9:51 AM

## 2020-04-19 NOTE — Evaluation (Signed)
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 Ankle pain, right   . Atypical chest pain    a. 10/2018 MV: EF 57%. No ischemia/infarct.  . Coronary artery disease   . Diastolic dysfunction    a. 04/2018 Echo: EF 55-60%, no rwma, Gr1 DD. Triv AI, mild MR. Mod dil LA. Nl RV fxn. PASP nl.  . GERD (gastroesophageal reflux disease)   . Headache    "dull one sometimes daily, at least weekly in last couple months" (07/22/2018)  . Hyperlipidemia   . Hypertension   . 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 Joint pain in fingers of right hand   . Lichen sclerosus   . Osteopenia   . Pneumonia 2012? X 1  . 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 Psoriasis   . Psoriatic arthritis ([place])    " dx'd the 1st of this year, 2019" (07/22/2018)  . Psoriatic arthritis ([place])   . Rosacea   . Seborrheic keratosis   . Second degree heart block    a. 07/2018 s/p SJM Assurity MRI model PM2271 (Ser# [id number]).  . Sleep apnea    "using nasal pilllows since ~ 12/2017" (07/22/2018)  . TMJ (dislocation of temporomandibular joint)    Past Surgical History:  Past Surgical History:  Procedure Laterality Date  . APPENDECTOMY    . BREAST CYST ASPIRATION Bilateral   . BREAST CYST EXCISION Right 1978   benign  . COLONOSCOPY    . COLONOSCOPY WITH PROPOFOL N/A 07/10/2019   Procedure: COLONOSCOPY WITH PROPOFOL;  Surgeon: Lollie Sails, MD;  Location: [facility] ENDOSCOPY;  Service: Endoscopy;  Laterality: N/A;  . ESOPHAGOGASTRODUODENOSCOPY (EGD) WITH PROPOFOL N/A 07/10/2019   Procedure:  ESOPHAGOGASTRODUODENOSCOPY (EGD) WITH PROPOFOL;  Surgeon: Lollie Sails, MD;  Location: [facility] ENDOSCOPY;  Service: Endoscopy;  Laterality: N/A;  . HYSTEROSCOPY WITH D & C N/A 11/05/2018   Procedure: DILATATION AND CURETTAGE /HYSTEROSCOPY;  Surgeon: Homero Fellers, MD;  Location: [facility] ORS;  Service: Gynecology;  Laterality: N/A;  . INSERT / REPLACE / REMOVE PACEMAKER    . 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 UTERINE POLYPS REMOVAL      Assessment / Plan / Recommendation Clinical Impression   IOM:BTDHRCBU Alison Huffman is an 79 year old female with history of CAD,PPM,HTN, OSA, psoriasisarthritis--transitioned from Humira toEnbrelrecently; who was admitted to [facility] on 03/27/20 with reports of numbness and tingling which started in BLE,progressed to torso and BUE, as well as weakness. Neurology consulted recommended LP due to concerns of GBS with progressive numbness/weakness with decreased reflexes. LP negative but she was started on IVIG empirically. She developed significant hyponatremia with drop in Na-119 felt to be due to IVIG and nephrology recommended discontinuation of IVIG as well as FR. Patient without improvement and was transferred to [facility] with plans for MRI on 07/03 but developed unresponsiveness with blank stare and developed respiratory distress requiring intubation. She was treated with Keppra briefly due to seizure in setting of hyponatremia. EEG showed diffuse slowing without seizure activity.  While  in MRI, she developed respiratory distress due to mucous plugging with decrease in LOC and code blue and required manual ventilation. She developed bilateral pneumothoraces due to high pressure requiring bilateral chest tube placement. MRI brain and was negative for acute abnormality. MRI cervical/thoracic spine showed cervical spondylosis but was negative for significant stenosis or cord abnormality. She  was started on plasmapheresis on 07/07 with improvement in strength. She was extubated 07/11 but developed respiratory distress later that day requiring BIPAP. She was started on Solumedrol due to stridor and Cefepime added for staph HCAP. Hypoxia improving and she has been weaned to Foley in place due to urinary retention. NPO recommended due to severe oropharyngeal dysphagia with difficulty in airway protection due to weakness and high aspiration risk--OK for trials of teaspoon of thins/nectas with ST/RN. Dr. 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 Pt was admitted to CIR 04/18/20 and ST evaluations were completed 04/19/20 with results as follows:  No family was present to determine pt's baseline cognitive function, however she reports feeling close to baseline with slightly reduced processing speed. SLP noted functional impairments with complex problem solving and reduced recall of new information during session (ex: compensatory swallow strategies). She alsp required 1 category cue during a  4-word delayed recall task. Pt also presents with oropharyngeal dysphagia, and per last MBSS has history of silent aspiration of thin liquids without use of compensatory swallow strategies. During today's evaluation, she exhibited an immediate throat clear on first presentation of ice chips, and wet vocal quality X1 with tsp trials of thin H2O; all other throat clearing determined to be volitional as part of her compensatory swallow regimen. With use of external aid, cueing could fade form Mod to Min A for implementation of strategies, which is likely improved since last acute ST visit. She also consumed conservative trials of puree solids, with swallow function that appeared [facility] per bedside observation; oral transit was timely and no overt s/sx aspiration  noted. Would recommend continue NPO with temporary alternative means of nutrition and medication administration for now, however ST will continue to provide skilled interventions for swallow intervention and assess readiness for repeat MBSS prior to diet advancement - anticipate pt may be ready to repeat testing by the end of the week.   Recommend pt receive skilled ST services while inpatient to address dysphagia and higher level cognitive impairments detailed above to ensure diet safety and efficiency as well as maximize safety and functional independence prior to d/c.     Skilled Therapeutic Interventions          Bedside swallow and cognitive-linguistic evaluations were administered and results were reviewed with pt (please see above for details regarding results).    SLP Assessment  Patient will need skilled Speech Lanaguage Pathology Services during CIR admission    Recommendations  SLP Diet Recommendations: Alternative means - temporary;NPO;Other (Comment) (may have ice or thin water via teaspoon after oral care with use of swallow strategies on list at bedside) Liquid Administration via: Spoon Medication Administration: Via alternative means Compensations: Effortful swallow;Multiple dry swallows after each bite/sip;Clear throat after each swallow Postural Changes and/or Swallow Maneuvers: Seated upright 90 degrees;Upright 30-60 min after meal Oral Care Recommendations: Oral care QID;Staff/trained caregiver to provide oral care Patient destination: [place] (SNF) Follow up Recommendations: 24 hour supervision/assistance;Skilled Nursing facility Equipment Recommended: None recommended by SLP  SLP Frequency 3 to 5 out of 7 days   SLP Duration  SLP Intensity  SLP Treatment/Interventions 21-24 days  Minumum of 1-2 x/day, 30 to 90 minutes  Cognitive remediation/compensation;Cueing hierarchy;Dysphagia/aspiration precaution training;Functional tasks;Patient/family  education;Internal/external aids    Pain Pain Assessment Pain Scale: 0-10 Pain Score: 0-No pain  Prior Functioning Type of Home: Apartment  Lives With: Alone Available Help at Discharge: Family;Friend(s)  SLP Evaluation Cognition Overall Cognitive Status: No family/caregiver present to determine baseline cognitive functioning Arousal/Alertness: Awake/alert Orientation Level: Oriented X4 Memory: Impaired Memory Impairment: Decreased recall of new information Immediate Memory Recall: Sock;Blue;Bed Memory Recall Sock: Without Cue Memory Recall Blue: Without Cue Memory Recall Bed: Without Cue Awareness: Appears intact Problem Solving: Impaired Problem Solving Impairment: Functional complex Executive Function: Writer: Impaired Organizing Impairment: Functional basic Safety/Judgment: Appears intact  Comprehension Auditory Comprehension Overall Auditory Comprehension: Appears within functional limits for tasks assessed Yes/No Questions: Within Functional Limits Commands: Within Functional Limits Conversation: Complex Visual Recognition/Discrimination Discrimination: Not tested Reading Comprehension Reading Status: Not tested Expression Expression Primary Mode of Expression: Verbal Verbal Expression Overall Verbal Expression: Appears within functional limits for tasks assessed Level of Generative/Spontaneous Verbalization: Sentence Non-Verbal Means of Communication: Not applicable Written Expression Written Expression: Not tested Oral Motor Oral Motor/Sensory Function Overall Oral Motor/Sensory Function: Generalized oral weakness Facial Symmetry: Within Functional Limits Facial Strength: Within Functional Limits Lingual Symmetry: Within Functional Limits Lingual Strength: Within Functional Limits Mandible: Within Functional Limits Motor Speech Overall Motor Speech: Appears within functional limits for tasks assessed Respiration: Within functional  limits Phonation: Low vocal intensity Intelligibility: Intelligible Motor Planning: Witnin functional limits Motor Speech Errors: Not applicable  Intelligibility: Intelligible  Bedside Swallowing Assessment General Date of Onset: 04/11/20 Previous Swallow Assessment: MBSS 04/15/20 Diet Prior to this Study: NPO;NG Tube Temperature Spikes Noted: No Respiratory Status: Supplemental O2 delivered via (comment) (Nasal cannula) History of Recent Intubation: Yes Length of Intubations (days): 8 days Date extubated: 04/11/20 Behavior/Cognition: Alert;Cooperative;Pleasant mood;Requires cueing Oral Cavity - Dentition: Adequate natural dentition Self-Feeding Abilities: Needs assist Vision: Functional for self-feeding Patient Positioning: Upright in bed Baseline Vocal Quality: Low vocal intensity Volitional Cough: Weak Volitional Swallow: Able to elicit  Oral Care Assessment Does patient have any of the following "high(er) risk" factors?: Diet - patient on tube feedings Does patient have any of the following "at risk" factors?: Oxygen therapy - cannula, mask, simple oxygen devices;Other - dysphagia Patient is AT RISK: Order set for Adult Oral Care Protocol initiated -  "At Risk Patients" option selected (see row information) Ice Chips Ice chips: Impaired Presentation: Spoon Pharyngeal Phase Impairments: Throat Clearing - Immediate;Wet Vocal Quality Thin Liquid Thin Liquid: Impaired Presentation: Spoon Pharyngeal  Phase Impairments: Throat Clearing - Delayed Nectar Thick Nectar Thick Liquid: Not tested Honey Thick Honey Thick Liquid: Not tested Puree Puree: Within functional limits Presentation: Spoon Solid Solid: Not tested BSE Assessment Risk for Aspiration Impact on safety and function: Moderate aspiration risk;Severe aspiration risk Other Related Risk Factors: Prolonged intubation;Decreased respiratory status;Deconditioning  Short Term Goals: Week 1: SLP Short Term Goal 1  (Week 1): During trials of thin H2O or ice, pt will use compensatory strategies with Min A cues X2 prior to repeat instrumental testing and potential diet advancement. SLP Short Term Goal 2 (Week 1): Pt will consume therapeutic trials of puree textures with minimal overt s/sx aspiration X2 prior to repeat instrumental testing and potential advancement. SLP Short Term Goal 3 (Week 1): Pt complete RMT exercises to improve cough strength and breath support with  Min A cues. SLP Short Term Goal 4 (Week 1): Pt will demonstrate ability to recall complex new and/or daily information wiht Min A cues for use of strategies or aids. SLP Short Term Goal 5 (Week 1): Pt will demonstrate ability to problem solve mildly complex situation with Min A multimodal cues.  Refer to Care Plan for Long Term Goals  Recommendations for other services: None   Discharge Criteria: Patient will be discharged from SLP if patient refuses treatment 3 consecutive times without medical reason, if treatment goals not met, if there is a change in medical status, if patient makes no progress towards goals or if patient is discharged from hospital.  The above assessment, treatment plan, treatment alternatives and goals were discussed and mutually agreed upon: by patient  Arbutus Leas 04/19/2020, 12:31 PM

## 2020-04-19 NOTE — Evaluation (Signed)
Physical Therapy Assessment and Plan  Patient Details  Name: Alison Huffman MRN: [id number] Date of Birth: [birth year]  PT Diagnosis: Abnormal posture, Abnormality of gait, Coordination disorder, Difficulty walking, Impaired sensation and Muscle weakness Rehab Potential: Good ELOS: 3-3.5 weeks   Today's Date: 04/19/2020 PT Individual Time: 4034-7425 PT Individual Time Calculation (min): 55 min    Hospital Problem: Principal Problem:   GBS (Guillain Barre syndrome) ([place])   Past Medical History:  Past Medical History:  Diagnosis Date  . Ankle pain, right   . Atypical chest pain    a. 10/2018 MV: EF 57%. No ischemia/infarct.  . Coronary artery disease   . Diastolic dysfunction    a. 04/2018 Echo: EF 55-60%, no rwma, Gr1 DD. Triv AI, mild MR. Mod dil LA. Nl RV fxn. PASP nl.  . GERD (gastroesophageal reflux disease)   . Headache    "dull one sometimes daily, at least weekly in last couple months" (07/22/2018)  . Hyperlipidemia   . Hypertension   . 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 Joint pain in fingers of right hand   . Lichen sclerosus   . Osteopenia   . Pneumonia 2012? X 1  . 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 Psoriasis   . Psoriatic arthritis ([place])    " dx'd the 1st of this year, 2019" (07/22/2018)  . Psoriatic arthritis ([place])   . Rosacea   . Seborrheic keratosis   . Second degree heart block    a. 07/2018 s/p SJM Assurity MRI model [id number] (Ser# [id number]).  . Sleep apnea    "using nasal pilllows since ~ 12/2017" (07/22/2018)  . TMJ (dislocation of temporomandibular joint)    Past Surgical History:  Past Surgical History:  Procedure Laterality Date  . APPENDECTOMY    . BREAST CYST ASPIRATION Bilateral   . BREAST CYST EXCISION Right 1978   benign  . COLONOSCOPY    . COLONOSCOPY WITH PROPOFOL N/A 07/10/2019   Procedure: COLONOSCOPY WITH PROPOFOL;  Surgeon: Lollie Sails, MD;  Location: [facility] ENDOSCOPY;  Service: Endoscopy;  Laterality: N/A;  .  ESOPHAGOGASTRODUODENOSCOPY (EGD) WITH PROPOFOL N/A 07/10/2019   Procedure: ESOPHAGOGASTRODUODENOSCOPY (EGD) WITH PROPOFOL;  Surgeon: Lollie Sails, MD;  Location: [facility] ENDOSCOPY;  Service: Endoscopy;  Laterality: N/A;  . HYSTEROSCOPY WITH D & C N/A 11/05/2018   Procedure: DILATATION AND CURETTAGE /HYSTEROSCOPY;  Surgeon: Homero Fellers, MD;  Location: [facility] ORS;  Service: Gynecology;  Laterality: N/A;  . INSERT / REPLACE / REMOVE PACEMAKER    . 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 UTERINE POLYPS REMOVAL      Assessment & Plan Clinical Impression: Patient is a 79 y.o. year old female with history of CAD,PPM,HTN, OSA, psoriasisarthritis--transitioned from Humira toEnbrelrecently; who was admitted to [facility] on 03/27/20 with reports of numbness and tingling which started in BLE,progressed to torso and BUE, as well as weakness. Neurology consulted recommended LP due to concerns of GBS with progressive numbness/weakness with decreased reflexes. LP negative but she was started on IVIG empirically. She developed significant hyponatremia with drop in Na-119 felt to be due to IVIG and nephrology recommended discontinuation of IVIG as well as FR. Patient without improvement and was transferred to [facility] with plans for MRI on 07/03 but developed unresponsiveness with blank stare and developed respiratory distress requiring intubation. She was treated with Keppra briefly due to seizure in setting of hyponatremia. EEG showed diffuse  slowing without seizure activity.   While in MRI, she developed respiratory distress due to mucous plugging with decrease in LOC and code blue and required manual ventilation. She developed bilateral pneumothoraces due to high pressure requiring bilateral chest tube placement. MRI brain and was negative for acute abnormality. MRI cervical/thoracic spine showed cervical spondylosis but was negative  for significant stenosis or cord abnormality. She was started on plasmapheresis on 07/07 with improvement in strength. She was extubated 07/11 but developed respiratory distress later that day requiring BIPAP. She was started on Solumedrol due to stridor and Cefepime added for staph HCAP. Hypoxia improving and she has been weaned to   Foley in place due to urinary retention. NPO recommended due to severe oropharyngeal dysphagia with difficulty in airway protection due to weakness and high aspiration risk--OK for trials of teaspoon of thins/nectas with ST/RN.  Dr. 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 .  Patient transferred to CIR on 04/18/2020 .   Patient currently requires max with mobility secondary to muscle weakness, decreased cardiorespiratoy endurance and decreased oxygen support, ataxia and decreased coordination, and decreased sitting balance, decreased standing balance, decreased postural control and decreased balance strategies.  Prior to hospitalization, patient was independent  with mobility and lived with Alone in a [place] home.  Home access is  Level entry.  Patient will benefit from skilled PT intervention to maximize safe functional mobility, minimize fall risk and decrease caregiver burden for planned discharge home with 24 hour assist.  Anticipate patient will benefit from follow up [facility] at discharge.  PT - End of Session Activity Tolerance: Tolerates 30+ min activity with multiple rests Endurance Deficit: Yes Endurance Deficit Description: generalized deconditioning PT Assessment Rehab Potential (ACUTE/IP ONLY): Good PT Barriers to Discharge: Decreased caregiver support PT Barriers to Discharge Comments: lives alone in independent living PT Patient demonstrates impairments in the following area(s):  Balance;Safety;Edema;Sensory;Endurance;Skin Integrity;Motor;Nutrition PT Transfers Functional Problem(s): Bed Mobility;Bed to Chair;Car PT Locomotion Functional Problem(s): Ambulation;Wheelchair Mobility;Stairs PT Plan PT Intensity: Minimum of 1-2 x/day ,45 to 90 minutes PT Frequency: 5 out of 7 days PT Duration Estimated Length of Stay: 3-3.5 weeks PT Treatment/Interventions: Ambulation/gait training;Community reintegration;DME/adaptive equipment instruction;Psychosocial support;Neuromuscular re-education;Stair training;UE/LE Strength taining/ROM;Wheelchair propulsion/positioning;UE/LE Coordination activities;Therapeutic Activities;Skin care/wound management;Pain management;Discharge planning;Balance/vestibular training;Functional mobility training;Disease management/prevention;Patient/family education;Splinting/orthotics;Therapeutic Exercise;Visual/perceptual remediation/compensation;Cognitive remediation/compensation PT Transfers Anticipated Outcome(s): min assist with LRAD PT Locomotion Anticipated Outcome(s): supervision w/c mobility, min assist short distance gait with LRAD PT Recommendation Recommendations for Other Services: Neuropsych consult;Therapeutic Recreation consult Therapeutic Recreation Interventions: Stress management Follow Up Recommendations:  (HHPT with 24 hr assist vs SNF) Patient destination:  (per chart pt plans to d/c to SNF at her independent living facility) Equipment Recommended: To be determined  Skilled Therapeutic Intervention Pt received in bed & agreeable to tx. Educated pt on ELOS, daily therapy schedule, weekly team meetings & other CIR information. Provided pt with w/c cushion to promote OOB tolerance & prevent skin breakdown. Pt rolls L<>R with mod assist with bed rails & HOB elevated, and transfers supine>sit with max assist to transfer BLE to EOB & assist with uprighting trunk, as well as sit>supine with max assist for BLE management. Pt is able to sit EOB  with BUE/BLE support & min assist. Provided pt with stedy & attempted sit>stand from significantly elevated EOB (almost high perch position) with use of stedy but pt unable to achieve full upright standing  with max assist, as pt unable to weight shift pelvis anteriorly. Pt assisted back to bed & performed BLE heel slides (AAROM RLE) 1 set x 10 reps for strengthening. Pt reports need to void & PT provides dependent assist for clothing management & bed pan placement. Pt with continent void but spillage from bed pan. Pt left in care of nursing staff to assist with changing bed linens.  Pt on 2L/min supplemental oxygen throughout session via nasal cannula. SpO2 >90% throughout. HR = 96-111 bpm during session.  PT Evaluation Precautions/Restrictions Precautions Precautions: Fall Precaution Comments: gross limb ataxia, hx of pacemaker, on supplemental oxygen Restrictions Weight Bearing Restrictions: No  General Chart Reviewed: Yes Additional Pertinent History: CAD, HTN, OSA, psoriatic arthritis, HLD, inflammatory neuropathy, osteopenia, hx of pacemaker (2019), 2nd degree heart block, sleep apnea Response to Previous Treatment: Patient with no complaints from previous session. Family/Caregiver Present: No   Pain No c/o pain  Home Living/Prior Functioning Home Living Living Arrangements: Alone Available Help at Discharge: Family;Friend(s);Available PRN/intermittently Type of Home: Apartment Home Access: Level entry Home Layout: One level  Lives With: Alone Prior Function Level of Independence: Independent with basic ADLs;Independent with transfers;Independent with gait Driving: Yes Comments: retired NP, walked 1 mile day 5x/week  Vision/Perception  Wears glasses for driving only. No changes in baseline vision. Wears 1 contact at baseline. Perception appears WNL.   Cognition Overall Cognitive Status: No family/caregiver present to determine baseline cognitive  functioning Arousal/Alertness: Awake/alert Orientation Level: Oriented X4 Awareness: Appears intact Safety/Judgment: Appears intact  Sensation Sensation Light Touch: Impaired by gross assessment; reports tingling in BLE & B hands Light Touch Impaired Details: Impaired RLE;Impaired LLE Proprioception: Impaired Detail Proprioception Impaired Details: Impaired LLE;Impaired RLE Coordination Gross Motor Movements are Fluid and Coordinated: No Fine Motor Movements are Fluid and Coordinated: No Coordination and Movement Description: significant weakness all four extremities  Motor  Motor Motor: Ataxia Motor - Skilled Clinical Observations: significant generalized weakness   Mobility Bed Mobility Bed Mobility: Rolling Right;Supine to Sit;Sit to Supine;Rolling Left (bed rails, HOB elevated) Rolling Right: Moderate Assistance - Patient 50-74% Rolling Left: Moderate Assistance - Patient 50-74% Supine to Sit: Maximal Assistance - Patient - Patient 25-49% Sit to Supine: Maximal Assistance - Patient 25-49%  Locomotion  Gait Ambulation: No Gait Gait: No Stairs / Additional Locomotion Stairs: No Wheelchair Mobility Wheelchair Mobility: No   Trunk/Postural Assessment  Postural Control Postural Control: Deficits on evaluation Righting Reactions: impaired, delayed Protective Responses: impaired, delayed   Balance Balance Balance Assessed: Yes Static Sitting Balance Static Sitting - Balance Support: Bilateral upper extremity supported;Feet supported Static Sitting - Level of Assistance: 4: Min assist  Extremity Assessment  RUE Assessment RUE Assessment: Exceptions to [facility] Passive Range of Motion (PROM) Comments: WFL General Strength Comments: Generalized weakness 3/5 overall LUE Assessment LUE Assessment: Exceptions to [facility] Active Range of Motion (AROM) Comments: WFL General Strength Comments: Generalized weakness 3/5 overall  RLE Assessment RLE Assessment: Exceptions to  [facility] General Strength Comments: 2/5 hip flexion in sitting, 2/5 dorsiflexion, 2+/5 knee extension LLE Assessment LLE Assessment: Exceptions to [facility] General Strength Comments: 2/5 hip flexion in sitting, 2/5 dorsiflexion, 3-/5 knee extension    Refer to Care Plan for Long Term Goals  Recommendations for other services: Neuropsych and Therapeutic Recreation  Stress management  Discharge Criteria: Patient will be discharged from PT if patient refuses treatment 3 consecutive times without medical reason, if treatment goals not met, if there is a change in medical status, if patient makes no progress  towards goals or if patient is discharged from hospital.  The above assessment, treatment plan, treatment alternatives and goals were discussed and mutually agreed upon: by patient  Waunita Schooner 04/19/2020, 4:33 PM

## 2020-04-19 NOTE — Progress Notes (Signed)
Initial Nutrition Assessment  DOCUMENTATION CODES:   Non-severe (moderate) malnutrition in context of chronic illness  INTERVENTION:   Tube feeding via Cortrak: - Osmolite 1.2 @ 75 ml/hr to run for 20 hours (tube feeding can be held for up to 4 hours for therapies) - Free water per MD/PA, currently 30 ml q 4 hours  Tube feeding regimen and current free water provides 1800 kcal, 83 grams of protein, and 1410 ml of H2O.   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  GOAL:   Patient will meet greater than or equal to 90% of their needs  MONITOR:   Diet advancement, Labs, Weight trends, Skin, TF tolerance  REASON FOR ASSESSMENT:   Consult Enteral/tube feeding initiation and management  ASSESSMENT:   79 year old female with PMH of CAD, PPM,  HTN, OSA, psoriasis arthritis. Pt was admitted to [facility] on 6/26 with reports of numbness and tingling as well as weakness. On 7/03, pt developed unresponsiveness with blank stare and developed respiratory distress requiring intubation. While in MRI, pt developed respiratory distress due to mucous plugging with decrease in LOC and Code Blue and required manual ventilation. Pt developed bilateral pneumothoraces due to high pressure requiring bilateral chest tube placement. Pt was started on plasmapheresis on 7/07 with improvement in strength. Pt was extubated  7/11. NPO recommended due to severe oropharyngeal dysphagia and pt with Cortrak for tube feeds. Neurology felt that patient likely with GBS. Admitted to CIR on 7/18.   Per SLP note, plan for repeat MBSS prior to diet advancement.  Spoke with pt at bedside. Pt reports that she is tolerating TF well. Pt denies any nausea, bloating, or abdominal pain. Tube feeding infusing as ordered.  RD to adjust TF regimen so that tube feeds can be off during therapies.  Pt reports  that her UBW is 169 lbs. Pt believes she last weighed this about a year ago Pt reports trying to lost weight during 2020 but also reports some unintentional weight loss related to current diagnosis.  Reviewed weights in chart. Pt with an 8.9 kg weight loss since 03/24/20. This is an 11.5% weight loss in 1 month which is significant for timeframe.  Current TF: Osmolite 1.2 @ 60 ml/hr, free water 30 ml q 4 hours  Medications reviewed and include: SSI q 4 hours, protonix  Labs reviewed. CBG's: 120-133 x 24 hours  NUTRITION - FOCUSED PHYSICAL EXAM:    Most Recent Value  Orbital Region Mild depletion  Upper Arm Region No depletion  Thoracic and Lumbar Region No depletion  Buccal Region No depletion  Temple Region Mild depletion  Clavicle Bone Region Mild depletion  Clavicle and Acromion Bone Region Mild depletion  Scapular Bone Region No depletion  Dorsal Hand Mild depletion  Patellar Region Mild depletion  Anterior Thigh Region Mild depletion  Posterior Calf Region Mild depletion  Edema (RD Assessment) None  Hair Reviewed  Eyes Reviewed  Mouth Reviewed  Skin Reviewed  Nails Reviewed       Diet Order:   Diet Order            Diet NPO time specified  Diet effective now                 EDUCATION NEEDS:   No education needs have been identified at this time  Skin:  Skin Assessment: Skin Integrity Issues: Other: fissure to coccyx  Last BM:  04/19/20 large type 7  Height:   Ht Readings from Last 1 Encounters:  04/18/20 5\' 6"  (1.676 m)    Weight:   Wt Readings from Last 1 Encounters:  04/18/20 68.3 kg    Ideal Body Weight:  59.1 kg  BMI:  Body mass index is 24.3 kg/m.  Estimated Nutritional Needs:   Kcal:  1700-1900  Protein:  80-95 grams  Fluid:  >/= 1.7 L    Gaynell Face, MS, RD, LDN Inpatient Clinical Dietitian Please see AMiON for contact information.

## 2020-04-19 NOTE — Progress Notes (Signed)
[place] PHYSICAL MEDICINE & REHABILITATION PROGRESS NOTE   Subjective/Complaints:   Pt denies pain- Used the bedpan yesterday, but doesn't know if had BM.   Got here Sunday, she thinks, Feet cold. Otherwise, no complaints.    ROS: . Pt denies SOB, abd pain, CP, N/V/C/D, and vision changes  Objective:   No results found. Recent Labs    04/18/20 1201 04/19/20 0423  WBC 11.2* 10.7*  HGB 11.0* 11.1*  HCT 34.9* 35.0*  PLT 386 405*   Recent Labs    04/18/20 1201 04/19/20 0423  NA 138 138  K 4.6 4.5  CL 104 102  CO2 25 29  GLUCOSE 114* 125*  BUN 11 16  CREATININE 0.32* 0.46  CALCIUM 9.2 9.4    Intake/Output Summary (Last 24 hours) at 04/19/2020 1856 Last data filed at 04/19/2020 1500 Gross per 24 hour  Intake 600 ml  Output 400 ml  Net 200 ml     Physical Exam: Vital Signs Blood pressure 130/60, pulse 99, temperature 98.8 F (37.1 C), resp. rate 17, height 5\' 6"  (1.676 m), weight 68.3 kg, SpO2 99 %.  Vitals and nursing notes reviewed Constitutional:  frail appearing older female supine in bed; NAD- room chilly  HENT: Has O2 by [place] and NGT in place receiving TFs Cardiovascular: RRR .  Pulmonary: CTA B/L- no W/R/R- good air movement Abdominal: Soft, NT, ND, (+)BS   Musculoskeletal:  General: No swelling,deformityor signs of injury.  Skin: General: Skin is warmand dry.  Comments: Stage II sacral wound Neurological:  Mental Status: She is alert.  Comments: Pt alert and oriented x3. Weak cough and phonation. Mild dysarthria . Reasonable insight and awareness. RUE 2- to 3+ prox to distal. LUE 2 to 4/5 prox to distal. BLE 3- to 3/5 HF, KE and 3/5 ADF/PF. Stocking glove sensory loss below knees bilaterally and along finger tips of both hands. DTR's absent Psychiatric: appropriate   Assessment/Plan: 1. 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  Physiatrist is providing close team supervision and 24 hour management of active medical problems listed below.  Physiatrist and rehab team continue to assess barriers to discharge/monitor patient progress toward functional and medical goals  Care Tool:  Bathing    Body parts bathed by patient: Right arm, Left arm, Abdomen, Chest, Right upper leg, Left upper leg, Face   Body parts bathed by helper: Left lower leg, Right lower leg, Front perineal area, Buttocks     Bathing assist Assist Level: Maximal Assistance - Patient 24 - 49%     Upper Body Dressing/Undressing Upper body dressing   What is the patient wearing?: Pull over shirt    Upper body assist Assist Level: Maximal Assistance - Patient 25 - 49%    Lower Body Dressing/Undressing Lower body dressing      What is the patient wearing?: Pants     Lower body assist Assist for lower body dressing: Total Assistance - Patient < 25%     Toileting Toileting    Toileting assist Assist for toileting: Moderate Assistance - Patient 50 - 74%     Transfers Chair/bed transfer  Transfers assist  Chair/bed transfer activity did not occur: Safety/medical concerns        Locomotion Ambulation   Ambulation assist   Ambulation activity did not occur: Safety/medical concerns          Walk 10 feet activity  Assist  Walk 10 feet activity did not occur: Safety/medical concerns        Walk 50 feet activity   Assist Walk 50 feet with 2 turns activity did not occur: Safety/medical concerns         Walk 150 feet activity   Assist Walk 150 feet activity did not occur: Safety/medical concerns         Walk 10 feet on uneven surface  activity   Assist Walk 10 feet on uneven surfaces activity did not occur: Safety/medical concerns         Wheelchair     Assist Will patient use wheelchair at discharge?: Yes Type of Wheelchair: Manual Wheelchair  activity did not occur: Safety/medical concerns         Wheelchair 50 feet with 2 turns activity    Assist    Wheelchair 50 feet with 2 turns activity did not occur: Safety/medical concerns       Wheelchair 150 feet activity     Assist  Wheelchair 150 feet activity did not occur: Safety/medical concerns       Blood pressure 130/60, pulse 99, temperature 98.8 F (37.1 C), resp. rate 17, height 5\' 6"  (1.676 m), weight 68.3 kg, SpO2 99 %.  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 Antithrombotics: -DVT/anticoagulation:Pharmaceutical:Lovenox -antiplatelet therapy: n/a 3. Pain Management:tylenol prn -pt with minimal pain at present 4. Mood:patient appears up beat and motivated -team to provide ego support as needed -antipsychotic agents: n/a 5. Neuropsych: This patientiscapable of making decisions on herown behalf. 6. Skin/Wound Care: -Stage II sacral wound -pressure relief -local care, maximize nutrition -has moderate protein-calorie malnutrition 7. Fluids/Electrolytes/Nutrition:encourage PO -recent hyponatremia--resolved -check labs tomorrow  7/19- labs look great 8. Oropharyngeal dysphagia: -continue NPO with trials with SLP -TF and freewater via NGT 9. Staph Aureus pulmonary infiltrates/acute respiratory failure -continue ancef through 7/19 -continue O2 via [place] at 2L, wean to off as tolerated  7/19- Sats doing well- con't O2, but con't to wean 10. 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 Elevated LFT's  -U/S 7/12 with sludge, borderline hepatic steatosis -f/u U/S in one year 12. Neurogenic bladder -discontinue foley in AM -voiding trial 13. Hx of psoriasis -holding enbrel as there is concern that this might have been a contributing factor to her syndrome 14. Recent seizures -resolved -secondary to hyponatremia   15. Bowel incontinence  7/19- no control right now- doesn't know is going some of the time- 3 stools in last 24 hours.   LOS: 1 days A FACE TO FACE EVALUATION WAS PERFORMED  Laurencia Roma 04/19/2020, 6:56 PM

## 2020-04-19 NOTE — Progress Notes (Signed)
Inpatient Rehabilitation Care Coordinator Assessment and Plan  Patient Details  Name: CLORENE NERIO MRN: [id number] Date of Birth: 1941-08-05  Today's Date: 04/19/2020  Problem List:  Patient Active Problem List   Diagnosis Date Noted  . GBS (Guillain Barre syndrome) ([place]) 04/18/2020  . Benign essential HTN   . Chronic diastolic congestive heart failure ([place])   . Coronary artery disease involving native coronary artery of native heart without angina pectoris   . Respiratory failure ([place])   . Guillain-Barre ([place])   . Pressure injury of skin 04/06/2020  . Weakness   . Acute on chronic respiratory failure with hypoxemia ([place]) 04/03/2020  . 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 Ambulatory dysfunction 03/28/2020  . Numbness and tingling of both feet 03/27/2020  . Numbness and tingling of lower extremity 03/24/2020  . Inversion of right nipple 10/01/2019  . Breast cancer screening 09/22/2019  . Constipation 03/24/2019  . Cyst of left ovary 01/15/2019  . History of gastroesophageal reflux (GERD) 12/12/2018  . Postmenopausal bleeding 09/30/2018  . Thickened endometrium 09/30/2018  . Bilateral shoulder pain 09/20/2018  . Bradycardia 07/22/2018  . Second degree AV block, Mobitz type II 07/22/2018  . Exertional dyspnea 03/21/2018  . Greater trochanteric bursitis of both hips 12/18/2017  . Encounter for long-term (current) use of high-risk medication 12/10/2017  . Psoriasis 09/27/2017  . Prediabetes 09/19/2017  . Stiffness of right hand joint 08/28/2017  . Leg swelling 08/28/2017  . 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 Allergic rhinitis 06/14/2017  . Ankle pain, right 06/14/2017  . History of carotid stenosis 06/14/2017  . Psoriatic arthritis ([place]) 06/14/2017  . Lichen sclerosus   . GERD (gastroesophageal reflux disease)   . Hypertension 03/02/2017  . Hyperlipidemia 03/02/2017  . OSA (obstructive sleep apnea) 03/02/2017  . Eye exam, routine 03/02/2017  .  Osteoporosis 03/02/2017   Past Medical History:  Past Medical History:  Diagnosis Date  . Ankle pain, right   . Atypical chest pain    a. 10/2018 MV: EF 57%. No ischemia/infarct.  . Coronary artery disease   . Diastolic dysfunction    a. 04/2018 Echo: EF 55-60%, no rwma, Gr1 DD. Triv AI, mild MR. Mod dil LA. Nl RV fxn. PASP nl.  . GERD (gastroesophageal reflux disease)   . Headache    "dull one sometimes daily, at least weekly in last couple months" (07/22/2018)  . Hyperlipidemia   . Hypertension   . 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 Joint pain in fingers of right hand   . Lichen sclerosus   . Osteopenia   . Pneumonia 2012? X 1  . 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 Psoriasis   . Psoriatic arthritis ([place])    " dx'd the 1st of this year, 2019" (07/22/2018)  . Psoriatic arthritis ([place])   . Rosacea   . Seborrheic keratosis   . Second degree heart block    a. 07/2018 s/p SJM Assurity MRI model [id number] (Ser# [id number]).  . Sleep apnea    "using nasal pilllows since ~ 12/2017" (07/22/2018)  . TMJ (dislocation of temporomandibular joint)    Past Surgical History:  Past Surgical History:  Procedure Laterality Date  . APPENDECTOMY    . BREAST CYST ASPIRATION Bilateral   . BREAST CYST EXCISION Right 1978   benign  . COLONOSCOPY    . COLONOSCOPY WITH PROPOFOL N/A 07/10/2019   Procedure: COLONOSCOPY WITH PROPOFOL;  Surgeon: Lollie Sails, MD;  Location: [facility] ENDOSCOPY;  Service: Endoscopy;  Laterality: N/A;  . ESOPHAGOGASTRODUODENOSCOPY (EGD) WITH PROPOFOL N/A 07/10/2019   Procedure: ESOPHAGOGASTRODUODENOSCOPY (EGD) WITH PROPOFOL;  Surgeon: Lollie Sails, MD;  Location: [facility] ENDOSCOPY;  Service: Endoscopy;  Laterality: N/A;  . HYSTEROSCOPY WITH D & C N/A 11/05/2018   Procedure: DILATATION AND CURETTAGE /HYSTEROSCOPY;  Surgeon: Homero Fellers, MD;  Location: [facility] ORS;  Service: Gynecology;  Laterality: N/A;  . INSERT / REPLACE / REMOVE PACEMAKER    .  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 UTERINE POLYPS REMOVAL     Social History:  reports that she has been smoking cigarettes. She has never used smokeless tobacco. She reports current alcohol use. She reports that she does not use drugs.  Family / Support Systems Marital Status: Single Spouse/Significant Other: N/A Children: N/A Other Supports: NiecesAbigail Butts [phone]) and [place] [phone]) Anticipated Caregiver: SNF in [facility] Ability/Limitations of Caregiver: None reported Caregiver Availability: 24/7 Family Dynamics: Pt lives alone at [facility]  Social History Preferred language: English Religion: Baptist Cultural Background: Pt worked as an Therapist, sports in Social research officer, government for 22 years Education: some college Read: Yes Write: Yes Employment Status: Retired Date Retired/Disabled/Unemployed: Heritage manager Issues: Denies Guardian/Conservator: N/A   Abuse/Neglect Abuse/Neglect Assessment Can Be Completed: Yes Physical Abuse: Denies Verbal Abuse: Denies Sexual Abuse: Denies Exploitation of patient/patient's resources: Denies Self-Neglect: Denies  Emotional Status Pt's affect, behavior and adjustment status: Pt in good spirits at time of visit Recent Psychosocial Issues: Admits to situational depression since admission Psychiatric History: Denies Substance Abuse History: Denies; occassional EtoH (wine)  Patient / Family Perceptions, Expectations & Goals Pt/Family understanding of illness & functional limitations: Pt and family have a general understanding of pt care needs Premorbid pt/family roles/activities: Independent Anticipated changes in roles/activities/participation: Transition to SNF within current community setting Pt/family expectations/goals: Pt goal is to work on Insurance underwriter and walking  [employer]:  None Premorbid Home Care/DME Agencies: None  Discharge Planning Living Arrangements: Alone Support Systems: Other relatives Type of Residence: [place] Name: Other (enter name of facility below) [place] Name: [place]: [employer], Multimedia programmer (specify) Sports administrator for Life) Museum/gallery curator Resources: Fish farm manager, Other (Comment) Financial Screen Referred: No Living Expenses: Other (Comment) Freight forwarder) Money Management: Family Does the patient have any problems obtaining your medications?: No Home Management: Pt now allows her niece Cherlynn June to manage her finances. Pt states her POA is her niece [name] and [place] Patient/Family Preliminary Plans: No changes Care Coordinator Barriers to Discharge: Decreased caregiver support, Lack of/limited family support Care Coordinator Anticipated Follow Up Needs: HH/OP  Clinical Impression SW met with pt in room to introduce self, explain role, and discuss discharge process. 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 HCPOA/POA- nieces [name] and [place]. Pt lives in independent living community in which they provide assistance/transportation to medical appointments, grocery shopping, etc. Pt states she will transition to SNF within Independent living community.   SW left message fro Brian/RN with [facility] with [place] [phone]). SW spoke with pt niece [name] to introduce self, explain role, and discuss discharge plan. Confirms above d/c plan to SNF. She reports she spoke with their D.O.N.  Jenny Reichmann [phone]) who informed her there would need to be communication with their SW.SW informed will follow-up with more details after team conference.  SW spoke with [name]/SW with [facility] [phone]) to discuss above.  Confirms same d/c plan. SW to provide more updates after team conference.   Guynell Kleiber A Haleigh Desmith 04/19/2020, 2:08 PM

## 2020-04-19 NOTE — Progress Notes (Signed)
Inpatient Rehabilitation  Patient information reviewed and entered into eRehab system by Shyler Holzman M. Kasi Lasky, M.A., CCC/SLP, PPS Coordinator.  Information including medical coding, functional ability and quality indicators will be reviewed and updated through discharge.    

## 2020-04-20 ENCOUNTER — Inpatient Hospital Stay (HOSPITAL_COMMUNITY): Payer: Medicare Other

## 2020-04-20 ENCOUNTER — Inpatient Hospital Stay (HOSPITAL_COMMUNITY): Payer: Medicare Other | Admitting: Physical Therapy

## 2020-04-20 ENCOUNTER — Inpatient Hospital Stay (HOSPITAL_COMMUNITY): Payer: Medicare Other | Admitting: Occupational Therapy

## 2020-04-20 LAB — GLUCOSE, CAPILLARY
Glucose-Capillary: 102 mg/dL — ABNORMAL HIGH (ref 70–99)
Glucose-Capillary: 105 mg/dL — ABNORMAL HIGH (ref 70–99)
Glucose-Capillary: 116 mg/dL — ABNORMAL HIGH (ref 70–99)
Glucose-Capillary: 132 mg/dL — ABNORMAL HIGH (ref 70–99)
Glucose-Capillary: 133 mg/dL — ABNORMAL HIGH (ref 70–99)
Glucose-Capillary: 137 mg/dL — ABNORMAL HIGH (ref 70–99)
Glucose-Capillary: 139 mg/dL — ABNORMAL HIGH (ref 70–99)

## 2020-04-20 NOTE — Patient Care Conference (Signed)
Inpatient RehabilitationTeam Conference and Plan of Care Update Date: 04/20/2020   Time: 2:59 PM    Patient Name: Alison Huffman      Medical Record Number: [id number]  Date of Birth: Apr 25, 1941 Sex: Female         Room/Bed: 4M07C/4M07C-01 Payor Info: Payor: MEDICARE / Plan: MEDICARE PART A AND B / Product Type: *No Product type* /    Admit Date/Time:  04/18/2020  1:06 PM  Primary Diagnosis:  GBS (Guillain Barre syndrome) [facility])  Hospital Problems: Principal Problem:   GBS (Guillain Barre syndrome) ([place]) Active Problems:   Malnutrition of moderate degree    Expected Discharge Date: Expected Discharge Date: 05/05/20  Team Members Present: Physician leading conference: Dr. Courtney Heys Care Coodinator Present: Loralee Pacas, LCSWA;Dilana Mcphie Creig Hines, RN, BSN, [place] Nurse Present: Other (comment) Dina Rich, RN) PT Present: Excell Seltzer, PT OT Present: Roanna Epley, COTA SLP Present: Weston Anna, SLP PPS Coordinator present : Gunnar Fusi, SLP     Current Status/Progress Goal Weekly Team Focus  Bowel/Bladder   Pt continent of bladder, can be incontinet of bowel at times. PVR q6. LBM 7/19  Improve continence  Assess toileting q shift and prn   Swallow/Nutrition/ Hydration   NPO except tsp thin and ice after oral care  Supervision  Trials thin with compensatory strategies, repeat MBSS to assess readiness for advancement, independence with use of strategies.   ADL's   max A/tot A for bathing/dressing at bed level; max A for bed mobility  min A overall except supervision for UB dressing  activity tolerance, sitting balance, functional transfers, BADL retraining   Mobility   mod rolling, max A supine to/from sit, unable to stand at PT eval  min A  strengthening, activity tolerance   Communication             Safety/Cognition/ Behavioral Observations  Min A  Supervision A  recall with strategies/aids, complex problem solving   Pain   No c/o pain  remain pain free   assess pain q shift and prn   Skin   Fissure on coccyx, MASD on bottom, Brusing on RUE  Prevent further skin breakdown  Assess skin q shift and prn     Team Discussion:  Discharge Planning/Teaching Needs:  D/c to SNF within independent living [facility] in [place]. SW to provide udpates to SNF on pt d/c date.  N/A   Current Update:  none  Current Barriers to Discharge:  Incontinence and Wound care  Possible Resolutions to Barriers: Timed toileting Q 2 hr, assess skin after each incontinent episode and keep clean, dry, with appropriate dressing in place.  Patient on target to meet rehab goals: yes, Incontinent of bowel r/t tube feed, MASD reported more than Stage 2 at sacrum. Low endurance and strength. Max assist with ADL's at bed level with min assist goals. SLP to do another Modified swallow later this week  *See Care Plan and progress notes for long and short-term goals.   Revisions to Treatment Plan:  Midline discontinued.    Medical Summary Current Status: foley out; voiding- doing PVRs- 100cc; decub- more moisture related- looking better- got midline out; Weekly Focus/Goal: plan for SNF; PT max A overall- low endurance- fatigues quickly;  Barriers to Discharge: Incontinence;Neurogenic Bowel & Bladder;Weight;Nutrition means;New oxygen;Decreased family/caregiver support;Home enviroment access/layout;Wound care  Barriers to Discharge Comments: sacral wound care; NPO- TFs; and d/c date 8/4 for now Possible Resolutions to Barriers: OT- max-total A ADLs- goals min A- fatigues  quickly; SLP- is NPO- last swallow 7/15- swallow this week- higher level cognition as well   Continued Need for Acute Rehabilitation Level of Care: The patient requires daily medical management by a physician with specialized training in physical medicine and rehabilitation for the following reasons: Direction of a multidisciplinary physical rehabilitation program to maximize  functional independence : Yes Medical management of patient stability for increased activity during participation in an intensive rehabilitation regime.: Yes Analysis of laboratory values and/or radiology reports with any subsequent need for medication adjustment and/or medical intervention. : Yes   I attest that I was present, lead the team conference, and concur with the assessment and plan of the team.   Cristi Loron 04/20/2020, 2:59 PM

## 2020-04-20 NOTE — Progress Notes (Signed)
Physical Therapy Session Note  Patient Details  Name: Alison Huffman MRN: [id number] Date of Birth: May 03, 1941  Today's Date: 04/20/2020 PT Individual Time: 0800-0853 PT Individual Time Calculation (min): 53 min   Short Term Goals: Week 1:  PT Short Term Goal 1 (Week 1): Pt will complete supine<>sit with mod assist +1 & hospital bed features. PT Short Term Goal 2 (Week 1): Pt will complete bed<>w/c transfer with max assist +1 & LRAD. PT Short Term Goal 3 (Week 1): Pt will propel w/c 50 ft with supervision.  Skilled Therapeutic Interventions/Progress Updates:    Pt received seated in bed, agreeable to PT session. No complaints of pain. Pt on 2L O2 at rest and during therapy session. SpO2 remains at 97% and above during session with activity. Rolling L/R with mod A in order to don pants dependently at bed level. Semi-reclined to sitting EOB with max A for BLE management and trunk elevation. Pt reports feeling weak while seated EOB, requires min to mod A for trunk control to prevent posterior LOB. Sit to stand with assist x 2 from elevated bed to stedy. Stedy transfer to w/c. Sit to stand x 2 reps from low seat height w/c to stedy with max A x 2. Pt requires increased assist with sit to stand with onset of fatigue. Pt agreeable to sit up in recliner at end of session. Stedy transfer to recliner. Pt left seated in recliner in room with needs in reach, quick release belt in place.  Therapy Documentation Precautions:  Precautions Precautions: Fall Precaution Comments: gross limb ataxia, hx of pacemaker, on supplemental oxygen Restrictions Weight Bearing Restrictions: No   Therapy/Group: Individual Therapy   Excell Seltzer, PT, DPT  04/20/2020, 12:15 PM

## 2020-04-20 NOTE — Progress Notes (Signed)
Occupational Therapy Session Note  Patient Details  Name: Alison Huffman MRN: [id number] Date of Birth: 1941/02/07  Today's Date: 04/20/2020 OT Individual Time: 1400-1430 OT Individual Time Calculation (min): 30 min    Short Term Goals: Week 1:  OT Short Term Goal 1 (Week 1): Pt will complete toilet transfer with max A +2 OT Short Term Goal 2 (Week 1): Pt will maintain sitting balance at EOB with min A while participating in BADL tasks OT Short Term Goal 3 (Week 1): Pt will tolerate sitting EOB for 10 mins in preparation for BADL task  Skilled Therapeutic Interventions/Progress Updates:    Pt resting in bed upon arrival and agreeable to therapy.  OT intervention with focus on bed mobility, sitting balance, sit<>stand with Stedy, standing balance in [place], and activity tolerance.  O2 reduced to 1L at MD request to start weaning pt from O2. O2 sats on 1L >94% throughout session. Supine>sit EOB with +2 (mod A). Sitting balance varied between close supervision for approx 15 seconds to min A when fatigued. Sit<>stand with max A+2 in [place]. Standing balance in Stedy for approx 5 seconds.  Pt fatigues quickly and requires multiple rest breaks.  Pt encouraged by accomplishments during session.  Pt returned to supine (HOB at 31 degrees).  All needs within reach and bed alarm activated.   Therapy Documentation Precautions:  Precautions Precautions: Fall Precaution Comments: gross limb ataxia, hx of pacemaker, on supplemental oxygen Restrictions Weight Bearing Restrictions: No Pain:  Pt denies pain this afternoon   Therapy/Group: Individual Therapy  Leroy Libman 04/20/2020, 2:48 PM

## 2020-04-20 NOTE — Plan of Care (Signed)
  Problem: Consults Goal: RH GENERAL PATIENT EDUCATION Description: See Patient Education module for education specifics. Outcome: Progressing Goal: Skin Care Protocol Initiated - if Braden Score 18 or less Description: If consults are not indicated, leave blank or document N/A Outcome: Progressing Goal: Nutrition Consult-if indicated Outcome: Progressing Goal: Diabetes Guidelines if Diabetic/Glucose > 140 Description: If diabetic or lab glucose is > 140 mg/dl - Initiate Diabetes/Hyperglycemia Guidelines & Document Interventions  Outcome: Progressing   Problem: RH BOWEL ELIMINATION Goal: RH STG MANAGE BOWEL WITH ASSISTANCE Description: STG Manage Bowel with max Assistance. Outcome: Progressing Goal: RH STG MANAGE BOWEL W/MEDICATION W/ASSISTANCE Description: STG Manage Bowel with Medication with mod-max Assistance. Outcome: Progressing   Problem: RH BLADDER ELIMINATION Goal: RH STG MANAGE BLADDER WITH ASSISTANCE Description: STG Manage Bladder With  max Assistance Outcome: Progressing Goal: RH STG MANAGE BLADDER WITH MEDICATION WITH ASSISTANCE Description: STG Manage Bladder With Medication With mod Assistance. Outcome: Progressing Goal: RH STG MANAGE BLADDER WITH EQUIPMENT WITH ASSISTANCE Description: STG Manage Bladder With Equipment With mod-max Assistance Outcome: Progressing   Problem: RH SKIN INTEGRITY Goal: RH STG SKIN FREE OF INFECTION/BREAKDOWN Description: Skin free from infection entire stay on rehab Outcome: Progressing   Problem: RH SAFETY Goal: RH STG ADHERE TO SAFETY PRECAUTIONS W/ASSISTANCE/DEVICE Description: STG Adhere to Safety Precautions With moderate  Assistance/Device. Outcome: Progressing

## 2020-04-20 NOTE — Progress Notes (Signed)
Physical Therapy Session Note  Patient Details  Name: Alison Huffman MRN: [id number] Date of Birth: 11-Sep-1941  Today's Date: 04/20/2020 PT Individual Time: 1500-1530 PT Individual Time Calculation (min): 30 min   Short Term Goals: Week 1:  PT Short Term Goal 1 (Week 1): Pt will complete supine<>sit with mod assist +1 & hospital bed features. PT Short Term Goal 2 (Week 1): Pt will complete bed<>w/c transfer with max assist +1 & LRAD. PT Short Term Goal 3 (Week 1): Pt will propel w/c 50 ft with supervision.  Skilled Therapeutic Interventions/Progress Updates:    Pt received seated in bed, agreeable to PT session. No complaints of pain. Supine to sit with max A for BLE management and trunk elevation. Attempt sit to stand to stedy, pt unable to stand with assist x 2 due to significant fatigue this PM. Deferred standing this date. Session focus on sitting balance and endurance EOB with close SBA to CGA needed to maintain upright sitting. Seated alt UE reaching outside BOS and across midline to targets with cues for anterior leaning as pt tends to fall posteriorly. Seated ball toss 2 x 10 reps. Pt reports some soreness in her neck following unsupported sitting, resolves once semi-reclined in bed again. Sitting EOB to semi-reclined in bed with mod A for BLE management. Pt left semi-reclined in bed with needs in reach, bed alarm in place at end of session.  Therapy Documentation Precautions:  Precautions Precautions: Fall Precaution Comments: gross limb ataxia, hx of pacemaker, on supplemental oxygen Restrictions Weight Bearing Restrictions: No   Therapy/Group: Individual Therapy   Excell Seltzer, PT, DPT  04/20/2020, 3:38 PM

## 2020-04-20 NOTE — Progress Notes (Signed)
[facility] PHYSICAL MEDICINE & REHABILITATION PROGRESS NOTE   Subjective/Complaints:   Pt reports no pain- has a coarse cough- something stuck in throat- doesn't normally cough.  Had LBM yesterday- doing well.    ROS:  Pt denies SOB, abd pain, CP, N/V/C/D, and vision changes Does have something "stuck" in throat- keeps coughing  Objective:   No results found. Recent Labs    04/18/20 1201 04/19/20 0423  WBC 11.2* 10.7*  HGB 11.0* 11.1*  HCT 34.9* 35.0*  PLT 386 405*   Recent Labs    04/18/20 1201 04/19/20 0423  NA 138 138  K 4.6 4.5  CL 104 102  CO2 25 29  GLUCOSE 114* 125*  BUN 11 16  CREATININE 0.32* 0.46  CALCIUM 9.2 9.4    Intake/Output Summary (Last 24 hours) at 04/20/2020 0846 Last data filed at 04/20/2020 0003 Gross per 24 hour  Intake 610 ml  Output 300 ml  Net 310 ml     Physical Exam: Vital Signs Blood pressure 135/72, pulse 94, temperature 98.1 F (36.7 C), temperature source Oral, resp. rate 17, height 5\' 6"  (1.676 m), weight 68.3 kg, SpO2 96 %.  Vitals and nursing notes reviewed Constitutional:  older woman laying in bed in dark- alarm going off due to TFs on hold, NAD- O2 by [place]; NGT in place getting TFs Cardiovascular: RRR  Pulmonary: CTA B/L- no W/R/R- good air movement In spite of coarse cough, is CTA B/L Abdominal: Soft, NT, ND, (+)BS    Musculoskeletal:  General: No swelling,deformityor signs of injury.  Skin: General: Skin is warmand dry.  Comments: Stage II sacral wound Neurological:  Mental Status: She is alert.  Comments: Pt alert and oriented x3. Weak cough and phonation. Mild dysarthria . Reasonable insight and awareness. RUE 2- to 3+ prox to distal. LUE 2 to 4/5 prox to distal. BLE 3- to 3/5 HF, KE and 3/5 ADF/PF. Stocking glove sensory loss below knees bilaterally and along finger tips of both hands. DTR's absent Psychiatric: slightly flat affect   Assessment/Plan: 1. 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  Physiatrist is providing close team supervision and 24 hour management of active medical problems listed below.  Physiatrist and rehab team continue to assess barriers to discharge/monitor patient progress toward functional and medical goals  Care Tool:  Bathing    Body parts bathed by patient: Right arm, Left arm, Abdomen, Chest, Right upper leg, Left upper leg, Face   Body parts bathed by helper: Left lower leg, Right lower leg, Front perineal area, Buttocks     Bathing assist Assist Level: Maximal Assistance - Patient 24 - 49%     Upper Body Dressing/Undressing Upper body dressing   What is the patient wearing?: Pull over shirt    Upper body assist Assist Level: Maximal Assistance - Patient 25 - 49%    Lower Body Dressing/Undressing Lower body dressing      What is the patient wearing?: Incontinence brief     Lower body assist Assist for lower body dressing: Total Assistance - Patient < 25%     Toileting Toileting    Toileting assist Assist for toileting: Moderate Assistance - Patient 50 - 74%     Transfers Chair/bed transfer  Transfers assist  Chair/bed transfer activity did not occur: Safety/medical concerns        Locomotion Ambulation   Ambulation assist   Ambulation activity  did not occur: Safety/medical concerns          Walk 10 feet activity   Assist  Walk 10 feet activity did not occur: Safety/medical concerns        Walk 50 feet activity   Assist Walk 50 feet with 2 turns activity did not occur: Safety/medical concerns         Walk 150 feet activity   Assist Walk 150 feet activity did not occur: Safety/medical concerns         Walk 10 feet on uneven surface  activity   Assist Walk 10 feet on uneven surfaces activity did not occur: Safety/medical concerns          Wheelchair     Assist Will patient use wheelchair at discharge?: Yes Type of Wheelchair: Manual Wheelchair activity did not occur: Safety/medical concerns         Wheelchair 50 feet with 2 turns activity    Assist    Wheelchair 50 feet with 2 turns activity did not occur: Safety/medical concerns       Wheelchair 150 feet activity     Assist  Wheelchair 150 feet activity did not occur: Safety/medical concerns       Blood pressure 135/72, pulse 94, temperature 98.1 F (36.7 C), temperature source Oral, resp. rate 17, height 5\' 6"  (1.676 m), weight 68.3 kg, SpO2 96 %.  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 Antithrombotics: -DVT/anticoagulation:Pharmaceutical:Lovenox -antiplatelet therapy: n/a 3. Pain Management:tylenol prn -pt with minimal pain at present  7/20- denies pain/controlled- con't regimen 4. Mood:patient appears up beat and motivated -team to provide ego support as needed -antipsychotic agents: n/a 5. Neuropsych: This patientiscapable of making decisions on herown behalf. 6. Skin/Wound Care: -Stage II sacral wound -pressure relief -local care, maximize nutrition -has moderate protein-calorie malnutrition 7. Fluids/Electrolytes/Nutrition:encourage PO -recent hyponatremia--resolved -check labs tomorrow  7/19- labs look great 8. Oropharyngeal dysphagia: -continue NPO with trials with SLP -TF and freewater via NGT  7/20- is still NPO- con't TFs continuous currently-  9. Staph Aureus pulmonary infiltrates/acute respiratory failure -continue ancef through  7/19 -continue O2 via [place] at 2L, wean to off as tolerated  7/20- Sats 96% on O2 by [place] 2L- con't to wean O2 as tolerated 10. 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 Elevated LFT's -U/S 7/12 with sludge, borderline hepatic steatosis -f/u U/S in one year 12. Neurogenic bladder -discontinue foley in AM -voiding trial 13. Hx of psoriasis -holding enbrel as there is concern that this might have been a contributing factor to her syndrome 14. Recent seizures -resolved -secondary to hyponatremia  15. Bowel incontinence  7/19- no control right now- doesn't know is going some of the time- 3 stools in last 24 hours.  7/20- LBM yesterday   LOS: 2 days A FACE TO FACE EVALUATION WAS PERFORMED  Jamarrius Salay 04/20/2020, 8:46 AM

## 2020-04-20 NOTE — Progress Notes (Signed)
Speech Language Pathology Daily Session Note  Patient Details  Name: Alison Huffman MRN: [id number] Date of Birth: June 01, 1941  Today's Date: 04/20/2020 SLP Individual Time: 7741-4239 SLP Individual Time Calculation (min): 40 min  Short Term Goals: Week 1: SLP Short Term Goal 1 (Week 1): During trials of thin H2O or ice, pt will use compensatory strategies with Min A cues X2 prior to repeat instrumental testing and potential diet advancement. SLP Short Term Goal 2 (Week 1): Pt will consume therapeutic trials of puree textures with minimal overt s/sx aspiration X2 prior to repeat instrumental testing and potential advancement. SLP Short Term Goal 3 (Week 1): Pt complete RMT exercises to improve cough strength and breath support with Min A cues. SLP Short Term Goal 4 (Week 1): Pt will demonstrate ability to recall complex new and/or daily information wiht Min A cues for use of strategies or aids. SLP Short Term Goal 5 (Week 1): Pt will demonstrate ability to problem solve mildly complex situation with Min A multimodal cues.  Skilled Therapeutic Interventions: Skilled ST services focused on swallow and speech skills. SLP facilitated oral care piror to trials of thin liquid via tsp and ice chips, pt demonstrated use of swallow strategy with mod A fade to min A verbal/visual cues. Pt demonstrated immediate throat clear/cough on 5 out 7 trials, indicating possible increase of pharyngeal sensation. SLP ordered MBS for tomorrow due to pt's increase ability to utilize swallow strategy and NPO status. SLP administered RMT assessment, pt's MIP was a peak of 59.2 and MEP a peak of 62.2, both demonstrating meaningful weakness. SLP will instruct pt in use of IMST device set at 5 cm H20 and EMST device set at 11 cm H2O, in upcoming ST treatment sessions to aid in respiratory strength. Pt was left in room with call bell within reach and bed alarm set. ST recommends to continue skilled ST services.      Pain Pain  Assessment Pain Score: 0-No pain  Therapy/Group: Individual Therapy  Marinna Blane  [facility] 04/20/2020, 3:47 PM

## 2020-04-20 NOTE — Progress Notes (Signed)
Patient ID: Alison Huffman, female   DOB: 07-09-1941, 79 y.o.   MRN: [id number]  Patient has existing PASRR#: [id number]  SW spoke with [name]/SW with [facility] [phone]) to provide updates from team conference. Confirms pt will need to have Cortrak removed, and/or pt will need to have PEG placed in order to admit.   SW met with pt in room to provide updates from team conference, and d/c date 8/4 with goal to d/c enteral feeds. Pt aware there will be follow-up with her niece [name] on details.  Loralee Pacas, MSW, [place] Office: [phone] Cell: [phone] Fax: [phone]

## 2020-04-20 NOTE — Progress Notes (Signed)
Occupational Therapy Session Note  Patient Details  Name: Alison Huffman MRN: [id number] Date of Birth: 08-Sep-1941  Today's Date: 04/20/2020 OT Individual Time: 1027-1100 OT Individual Time Calculation (min): 33 min    Short Term Goals: Week 1:  OT Short Term Goal 1 (Week 1): Pt will complete toilet transfer with max A +2 OT Short Term Goal 2 (Week 1): Pt will maintain sitting balance at EOB with min A while participating in BADL tasks OT Short Term Goal 3 (Week 1): Pt will tolerate sitting EOB for 10 mins in preparation for BADL task  Skilled Therapeutic Interventions/Progress Updates:    Patient in bed, alert and ready for therapy session.  She notes fatigue but denies pain at this time.  Reviewed positioning and weight shifts bed level - good understanding demonstrated.  Completed UB bathing with set up.  She is able to pull her trunk forward using bilateral rails in long sit position for washing back and to change OH shirt - min A to doff/donn OH shirt.  Shower cap provided, min A to brush hair.  Completed bilateral UE AROM and proximal stability exercises with good results reviewed exercises that she can completed on her own to promote proximal strength.  She remained in bed at close of session, bed alarm set and call bell in reach.    Therapy Documentation Precautions:  Precautions Precautions: Fall Precaution Comments: gross limb ataxia, hx of pacemaker, on supplemental oxygen Restrictions Weight Bearing Restrictions: No  Therapy/Group: Individual Therapy  Carlos Levering 04/20/2020, 7:37 AM

## 2020-04-20 NOTE — Plan of Care (Signed)
  Problem: Consults Goal: RH GENERAL PATIENT EDUCATION Description: See Patient Education module for education specifics. Outcome: Progressing Goal: Skin Care Protocol Initiated - if Braden Score 18 or less Description: If consults are not indicated, leave blank or document N/A Outcome: Progressing Goal: Nutrition Consult-if indicated Outcome: Progressing Goal: Diabetes Guidelines if Diabetic/Glucose > 140 Description: If diabetic or lab glucose is > 140 mg/dl - Initiate Diabetes/Hyperglycemia Guidelines & Document Interventions  Outcome: Progressing   Problem: RH BOWEL ELIMINATION Goal: RH STG MANAGE BOWEL WITH ASSISTANCE Description: STG Manage Bowel with max Assistance. Outcome: Progressing Goal: RH STG MANAGE BOWEL W/MEDICATION W/ASSISTANCE Description: STG Manage Bowel with Medication with mod-max Assistance. Outcome: Progressing   Problem: RH BLADDER ELIMINATION Goal: RH STG MANAGE BLADDER WITH ASSISTANCE Description: STG Manage Bladder With  max Assistance Outcome: Progressing Goal: RH STG MANAGE BLADDER WITH MEDICATION WITH ASSISTANCE Description: STG Manage Bladder With Medication With mod Assistance. Outcome: Progressing Goal: RH STG MANAGE BLADDER WITH EQUIPMENT WITH ASSISTANCE Description: STG Manage Bladder With Equipment With mod-max Assistance Outcome: Progressing   Problem: RH SKIN INTEGRITY Goal: RH STG SKIN FREE OF INFECTION/BREAKDOWN Description: Skin free from infection entire stay on rehab Outcome: Progressing   Problem: RH SAFETY Goal: RH STG ADHERE TO SAFETY PRECAUTIONS W/ASSISTANCE/DEVICE Description: STG Adhere to Safety Precautions With moderate  Assistance/Device. Outcome: Progressing   Problem: RH PAIN MANAGEMENT Goal: RH STG PAIN MANAGED AT OR BELOW PT'S PAIN GOAL Description: Pain less than 2 Outcome: Progressing   Problem: RH KNOWLEDGE DEFICIT GENERAL Goal: RH STG INCREASE KNOWLEDGE OF SELF CARE AFTER HOSPITALIZATION Description:  Moderate assistance re: self care after hospitalization Outcome: Progressing

## 2020-04-20 NOTE — NC FL2 (Signed)
[place] LEVEL OF CARE SCREENING TOOL     IDENTIFICATION  Patient Name: Alison Huffman Birthdate: 20-Jun-1941 Sex: female Admission Date (Current Location): 04/18/2020  [facility] [place] Number:  Herbalist and Address:  The . [facility], [place] [address] [place], [place] [address]      Provider Number: [id number]  Attending Physician Name and Address:  Courtney Heys, MD  Relative Name and Phone Number:  Malachy Moan (Niece) [phone]    Current Level of Care: Hospital Recommended Level of Care: [place] Prior Approval Number:    Date Approved/Denied:   PASRR Number: [id number]  Discharge Plan: SNF    Current Diagnoses: Patient Active Problem List   Diagnosis Date Noted  . Malnutrition of moderate degree 04/19/2020  . GBS (Guillain Barre syndrome) ([place]) 04/18/2020  . Benign essential HTN   . Chronic diastolic congestive heart failure ([place])   . Coronary artery disease involving native coronary artery of native heart without angina pectoris   . Respiratory failure ([place])   . Guillain-Barre ([place])   . Pressure injury of skin 04/06/2020  . Weakness   . Acute on chronic respiratory failure with hypoxemia ([place]) 04/03/2020  . 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 Ambulatory dysfunction 03/28/2020  . Numbness and tingling of both feet 03/27/2020  . Numbness and tingling of lower extremity 03/24/2020  . Inversion of right nipple 10/01/2019  . Breast cancer screening 09/22/2019  . Constipation 03/24/2019  . Cyst of left ovary 01/15/2019  . History of gastroesophageal reflux (GERD) 12/12/2018  . Postmenopausal bleeding 09/30/2018  . Thickened endometrium 09/30/2018  . Bilateral shoulder pain 09/20/2018  . Bradycardia 07/22/2018  . Second degree AV block, Mobitz type II 07/22/2018  . Exertional dyspnea 03/21/2018  . Greater trochanteric bursitis of both hips 12/18/2017  . Encounter for  long-term (current) use of high-risk medication 12/10/2017  . Psoriasis 09/27/2017  . Prediabetes 09/19/2017  . Stiffness of right hand joint 08/28/2017  . Leg swelling 08/28/2017  . 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 Allergic rhinitis 06/14/2017  . Ankle pain, right 06/14/2017  . History of carotid stenosis 06/14/2017  . Psoriatic arthritis ([place]) 06/14/2017  . Lichen sclerosus   . GERD (gastroesophageal reflux disease)   . Hypertension 03/02/2017  . Hyperlipidemia 03/02/2017  . OSA (obstructive sleep apnea) 03/02/2017  . Eye exam, routine 03/02/2017  . Osteoporosis 03/02/2017    Orientation RESPIRATION BLADDER Height & Weight     Self, Time, Situation, Place  O2, Other (Comment) (2L [place]) Continent (urgency) Weight: 150 lb 9.2 oz (68.3 kg) Height:  5\' 6"  (167.6 cm)  BEHAVIORAL SYMPTOMS/MOOD NEUROLOGICAL BOWEL NUTRITION STATUS      Continent (urgency) Feeding tube (Plans to remove Cortrak prior to d/c.)  AMBULATORY STATUS COMMUNICATION OF NEEDS Skin   Extensive Assist Verbally Other (Comment) (Stage II on Coccyx- using mepilex foam PRN)                       Personal Care Assistance Level of Assistance  Bathing, Dressing Bathing Assistance: Maximum assistance   Dressing Assistance: Maximum assistance     Functional Limitations Info             SPECIAL CARE FACTORS FREQUENCY  PT (By licensed PT), OT (By licensed OT), Speech therapy     PT Frequency: 5xs per week OT Frequency: 5xs per week     Speech Therapy Frequency: 5xs per week  Contractures      Additional Factors Info  Allergies, Code Status Code Status Info: Full Allergies Info: [place]           Current Medications (04/20/2020):  This is the current hospital active medication list Current Facility-Administered Medications  Medication Dose Route Frequency Provider Last Rate Last Admin  . acetaminophen (TYLENOL) tablet 650 mg  650 mg Per Tube Q4H PRN Love, Pamela S, PA-C      . alum & mag  hydroxide-simeth (MAALOX/MYLANTA) 200-200-20 MG/5ML suspension 30 mL  30 mL Per Tube Q4H PRN Love, Pamela S, PA-C      . bisacodyl (DULCOLAX) suppository 10 mg  10 mg Rectal Daily PRN Love, Pamela S, PA-C      . chlorhexidine (PERIDEX) 0.12 % solution 15 mL  15 mL Mouth Rinse BID Bary Leriche, PA-C   15 mL at 04/20/20 0903  . Chlorhexidine Gluconate Cloth 2 % PADS 6 each  6 each Topical Daily Bary Leriche, PA-C   6 each at 04/20/20 1601  . diphenhydrAMINE (BENADRYL) capsule 25 mg  25 mg Per Tube Q6H PRN Love, Pamela S, PA-C      . enoxaparin (LOVENOX) injection 40 mg  40 mg Subcutaneous Q24H Love, Pamela S, PA-C   40 mg at 04/20/20 0902  . feeding supplement (OSMOLITE 1.2 CAL) liquid 1,000 mL  1,000 mL Per Tube Continuous Lovorn, Megan, MD 75 mL/hr at 04/20/20 0209 1,000 mL at 04/20/20 0209  . free water 30 mL  30 mL Per Tube Q4H Bary Leriche, PA-C   30 mL at 04/20/20 0903  . guaiFENesin (ROBITUSSIN) 100 MG/5ML solution 100 mg  5 mL Per Tube Q4H PRN Love, Pamela S, PA-C      . guaiFENesin-dextromethorphan (ROBITUSSIN DM) 100-10 MG/5ML syrup 5-10 mL  5-10 mL Per Tube Q6H PRN Love, Pamela S, PA-C      . insulin aspart (novoLOG) injection 0-15 Units  0-15 Units Subcutaneous Q4H Bary Leriche, PA-C   2 Units at 04/20/20 0501  . ipratropium-albuterol (DUONEB) 0.5-2.5 (3) MG/3ML nebulizer solution 3 mL  3 mL Nebulization Q6H PRN Love, Pamela S, PA-C      . MEDLINE mouth rinse  15 mL Mouth Rinse q12n4p Bary Leriche, PA-C   15 mL at 04/19/20 1528  . pantoprazole sodium (PROTONIX) 40 mg/20 mL oral suspension 40 mg  40 mg Per Tube Daily Bary Leriche, PA-C   40 mg at 04/20/20 0932  . prochlorperazine (COMPAZINE) tablet 5-10 mg  5-10 mg Per Tube Q6H PRN Love, Pamela S, PA-C       Or  . prochlorperazine (COMPAZINE) injection 5-10 mg  5-10 mg Intramuscular Q6H PRN Love, Pamela S, PA-C       Or  . prochlorperazine (COMPAZINE) suppository 12.5 mg  12.5 mg Rectal Q6H PRN Love, Pamela S, PA-C      .  senna-docusate (Senokot-S) tablet 1 tablet  1 tablet Per Tube QHS PRN Love, Pamela S, PA-C      . sodium phosphate (FLEET) 7-19 GM/118ML enema 1 enema  1 enema Rectal Once PRN Love, Pamela S, PA-C      . traZODone (DESYREL) tablet 25-50 mg  25-50 mg Per Tube QHS PRN Bary Leriche, PA-C         Discharge Medications: Please see discharge summary for a list of discharge medications.  Relevant Imaging Results:  Relevant Lab Results:   Additional Information SS#: [id number]  Rana Snare, LCSW

## 2020-04-20 NOTE — Care Management (Signed)
Inpatient [facility] Individual Statement of Services  Patient Name:  Alison Huffman  Date:  04/20/2020  Welcome to the [facility].  Our goal is to provide you with an individualized program based on your diagnosis and situation, designed to meet your specific needs.  With this comprehensive rehabilitation program, you will be expected to participate in at least 3 hours of rehabilitation therapies Monday-Friday, with modified therapy programming on the weekends.  Your rehabilitation program will include the following services:  Physical Therapy (PT), Occupational Therapy (OT), Speech Therapy (ST), 24 hour per day rehabilitation nursing, Therapeutic Recreaction (TR), Psychology, Neuropsychology, Care Coordinator, Rehabilitation Medicine, Nutrition Services, Pharmacy Services and Other  Weekly team conferences will be held on Tuesdays to discuss your progress.  Your Inpatient Rehabilitation Care Coordinator will talk with you frequently to get your input and to update you on team discussions.  Team conferences with you and your family in attendance may also be held.  Expected length of stay: 21-24 days  Overall anticipated outcome: Minimal Assistance  Depending on your progress and recovery, your program may change. Your Inpatient Rehabilitation Care Coordinator will coordinate services and will keep you informed of any changes. Your Inpatient Rehabilitation Care Coordinator's name and contact numbers are listed  below.  The following services may also be recommended but are not provided by the [facility] will be made to provide these services after discharge if needed.  Arrangements include referral to agencies that provide these services.  Your insurance has been verified to be:  Medicare A/B   Your primary doctor is:  Tommi Rumps  Pertinent information will be shared with your doctor and your insurance company.  Inpatient Rehabilitation Care Coordinator: Loralee Pacas ([phone]   Information discussed with and copy given to patient by: Rana Snare, 04/20/2020, 8:35 AM

## 2020-04-21 ENCOUNTER — Inpatient Hospital Stay (HOSPITAL_COMMUNITY): Payer: Medicare Other

## 2020-04-21 ENCOUNTER — Inpatient Hospital Stay (HOSPITAL_COMMUNITY): Payer: Medicare Other | Admitting: Physical Therapy

## 2020-04-21 ENCOUNTER — Encounter (HOSPITAL_COMMUNITY): Payer: Medicare Other | Admitting: Speech Pathology

## 2020-04-21 LAB — GLUCOSE, CAPILLARY
Glucose-Capillary: 155 mg/dL — ABNORMAL HIGH (ref 70–99)
Glucose-Capillary: 137 mg/dL — ABNORMAL HIGH (ref 70–99)
Glucose-Capillary: 101 mg/dL — ABNORMAL HIGH (ref 70–99)
Glucose-Capillary: 100 mg/dL — ABNORMAL HIGH (ref 70–99)
Glucose-Capillary: 95 mg/dL (ref 70–99)

## 2020-04-21 MED ORDER — INSULIN ASPART 100 UNIT/ML ~~LOC~~ SOLN
0.0000 [IU] | Freq: Three times a day (TID) | SUBCUTANEOUS | Status: DC
  Administered 2020-04-22 – 2020-04-25 (×2): 2 [IU] via SUBCUTANEOUS
  Administered 2020-04-26: 3 [IU] via SUBCUTANEOUS
  Administered 2020-04-27: 2 [IU] via SUBCUTANEOUS
  Administered 2020-04-27: 3 [IU] via SUBCUTANEOUS

## 2020-04-21 NOTE — Progress Notes (Signed)
Physical Therapy Session Note  Patient Details  Name: Alison Huffman MRN: [id number] Date of Birth: 1940/12/20  Today's Date: 04/21/2020 PT Individual Time: 0109-3235 PT Individual Time Calculation (min): 57 min  and  Today's Date: 04/21/2020 PT Missed Time: 18 Minutes Missed Time Reason: Patient fatigue  Short Term Goals: Week 1:  PT Short Term Goal 1 (Week 1): Pt will complete supine<>sit with mod assist +1 & hospital bed features. PT Short Term Goal 2 (Week 1): Pt will complete bed<>w/c transfer with max assist +1 & LRAD. PT Short Term Goal 3 (Week 1): Pt will propel w/c 50 ft with supervision.  Skilled Therapeutic Interventions/Progress Updates:   Pt received supine in bed and agreeable to therapy session. Pt received and maintained on 1L of O2 via nasal cannula throughout session - SpO2 monitored with activity to be >97%. In supine, donned pants max assist threading LEs and max assist pulling over hips via partial hooklying rolling. Supine>sitting R EOB, HOB slightly elevated and using bedrails, via logroll technique for increased pt independence with min assist for trunk upright. Sit>stand EOB>stedy mod assist of 1 for lifting - +2 for safety. Stedy transfer to w/c.  Transported to/from gym in w/c for time management and energy conservation. Sit<>stands in // bars with mod assist for lifting- max cuing for scooting towards front of chair, increased anterior trunk lean/weight shift, followed by hip extension to come to standing. Standing with BUE support on // bars min assist for balance. Gait training ~73ft in // bars x2 with min/mod assist of 1 for balance and guarding R knee in stance with +2 w/c follow - max sequential cuing for R/L weight shift, stepping, and bringing hands forward on bars - during 2nd walk pt able to improve sequencing and take increasing step lengths - demos narrow BOS and very short step lengths. Pt declines 3rd gait trial due to feeling "shaky" and starting to become  fatigued but agreeable to continue session. Therapist educated and provided visual demo of squat pivot transfers. L squat pivot w/c>EOM mod/max assist lifting and pivoting hips - max cuing for UE positioning, head/hips relationship, and increased anterior weight shift. Attempted sit<>stand x3 from EOM (elevated EOM on 3rd trial) to B HHA but pt unable to come to standing, remaining in squat position lacking activation of hip extensors - slight improvement when therapist standing in front of pt for facilitation but pt still not able to come to standing - some fear of falling noticed as well. Seated EOM trunk control, UE strengthening task holding 1kg weighted ball modified Russian twists and anterior trunk leans - close supervision for sitting balance and when fatigued pt would use B UEs for supported seated rest break. Pt reports increasing fatigue but agreeable to continue session. R squat pivot EOM>w/c mod/max assist lifting/pivoting hips - reinforced prior cuing. Seated from w/c level performed B LE strengthening using kinetron against 50cm/sec resistance for 30seconds reclined then 30seconds with anterior pelvic tilt promoting increased glute activation. Pt reports too fatigued to continue session at this time. Transported back to room in w/c. R squat pivot w/c>EOM mod/max assist. Sit>supine via reverse logroll technique to promote increased independence with min/mod assist for LE management into bed. Pt left supine in bed, HOB elevated 30 degrees, needs in reach, and bed alarm on. Missed 18 minutes of skilled physical therapy.   Therapy Documentation Precautions:  Precautions Precautions: Fall Precaution Comments: gross limb ataxia, hx of pacemaker, on supplemental oxygen Restrictions Weight Bearing Restrictions: No  Pain:   No reports of pain throughout session.   Therapy/Group: Individual Therapy  Tawana Scale , PT, DPT, CSRS  04/21/2020, 12:35 PM

## 2020-04-21 NOTE — Progress Notes (Signed)
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  Ongoing  GOAL:   Patient will meet greater than or equal to 90% of their needs  Progressing  MONITOR:   PO intake, Supplement acceptance, Diet advancement, Labs, Weight trends, TF tolerance, Skin  REASON FOR ASSESSMENT:   Consult Enteral/tube feeding initiation and management  ASSESSMENT:   79 year old female with PMH of CAD, PPM,  HTN, OSA, psoriasis arthritis. Pt was [facility] to [facility] on 6/26 with reports of numbness and tingling as well as weakness. On 7/03, pt developed unresponsiveness with blank stare and developed respiratory distress requiring intubation. While in MRI, pt developed respiratory distress due to mucous plugging with decrease in LOC and Code Blue and required manual ventilation. Pt developed bilateral pneumothoraces due to high pressure requiring bilateral chest tube placement. Pt was started on plasmapheresis on 7/07 with improvement in strength. Pt was extubated  7/11. NPO recommended due to severe oropharyngeal dysphagia and pt with Cortrak for tube feeds. Neurology felt that patient likely with GBS. Admitted to CIR on 7/18.  7/21 - MBS, diet advanced to dysphagia 3 with nectar-thick liquids, TF held 7/22 - Cortrak removed  Per notes, nursing feels pt's intermittent incontinence is due to TF and not neurogenic bowel. TF now d/c and Cortrak removed this AM.  Spoke with pt at bedside who reports breakfast went well but that she didn't like what she got for lunch. Pt interested in [employer] supplements. RD to  order.  Meal Completion: 5-50%  Medications reviewed and include: SSI, protonix  Labs reviewed. CBG's: 95-109 x 24 hours  Diet Order:   Diet Order            DIET DYS 3 Room service appropriate? Yes; Fluid consistency: Nectar Thick  Diet effective now                 EDUCATION NEEDS:   No education needs have been identified at this time  Skin:  Skin Assessment: Skin Integrity Issues: Stage II: right buttocks  Last BM:  04/22/20 small type 6  Height:   Ht Readings from Last 1 Encounters:  04/18/20 5\' 6"  (1.676 m)    Weight:   Wt Readings from Last 1 Encounters:  04/18/20 68.3 kg    Ideal Body Weight:  59.1 kg  BMI:  Body mass index is 24.3 kg/m.  Estimated Nutritional Needs:   Kcal:  1700-1900  Protein:  80-95 grams  Fluid:  >/= 1.7 L    Gaynell Face, MS, RD, LDN Inpatient Clinical Dietitian Please see AMiON for contact information.

## 2020-04-21 NOTE — IPOC Note (Signed)
Overall Plan of Care [facility]) Patient Details Name: Alison Huffman MRN: [id number] DOB: Nov 20, 1940  Admitting Diagnosis: GBS (Guillain Barre syndrome) [facility])  Hospital Problems: Principal Problem:   GBS (Guillain Barre syndrome) ([place]) Active Problems:   Malnutrition of moderate degree     Functional Problem List: Nursing Endurance, Motor, Nutrition, Safety, Skin Integrity, Bladder, Bowel, Medication Management  PT Balance, Safety, Edema, Sensory, Endurance, Skin Integrity, Motor, Nutrition  OT Balance, Endurance, Motor, Sensory  SLP Cognition, Nutrition  TR         Basic ADL's: OT Eating, Bathing, Grooming, Dressing, Toileting     Advanced  ADL's: OT       Transfers: PT Bed Mobility, Bed to Chair, Car  OT Tub/Shower, Toilet     Locomotion: PT Ambulation, Emergency planning/management officer, Stairs     Additional Impairments: OT Fuctional Use of Upper Extremity  SLP Swallowing, Social Cognition   Problem Solving, Memory  TR      Anticipated Outcomes Item Anticipated Outcome  Self Feeding Supervision  Swallowing  Supervision A   Basic self-care  Min A  Toileting  Min A   Bathroom Transfers Min A  Bowel/Bladder  mod I  Transfers  min assist with LRAD  Locomotion  supervision w/c mobility, min assist short distance gait with LRAD  Communication     Cognition  Supervision A  Pain  pain less than2  Safety/Judgment  mod I   Therapy Plan: PT Intensity: Minimum of 1-2 x/day ,45 to 90 minutes PT Frequency: 5 out of 7 days PT Duration Estimated Length of Stay: 3-3.5 weeks OT Intensity: Minimum of 1-2 x/day, 45 to 90 minutes OT Frequency: 5 out of 7 days OT Duration/Estimated Length of Stay: 21-24 days SLP Intensity: Minumum of 1-2 x/day, 30 to 90 minutes SLP Frequency: 3 to 5 out of 7 days SLP Duration/Estimated Length of Stay: 21-24 days   Due to the current state of emergency, patients may not be receiving their 3-hours of Medicare-mandated therapy.   Team  Interventions: Nursing Interventions Bladder Management, Bowel Management, Disease Management/Prevention, Cognitive Remediation/Compensation, Medication Management, Pain Management, Dysphagia/Aspiration Precaution Training, Skin Care/Wound Management  PT interventions Ambulation/gait training, Community reintegration, DME/adaptive equipment instruction, Psychosocial support, Neuromuscular re-education, Stair training, UE/LE Strength taining/ROM, Wheelchair propulsion/positioning, UE/LE Coordination activities, Therapeutic Activities, Skin care/wound management, Pain management, Discharge planning, Training and development officer, Functional mobility training, Disease management/prevention, Patient/family education, Splinting/orthotics, Therapeutic Exercise, Visual/perceptual remediation/compensation, Cognitive remediation/compensation  OT Interventions Training and development officer, Community reintegration, Discharge planning, DME/adaptive equipment instruction, Functional mobility training, Neuromuscular re-education, Patient/family education, Pain management, Psychosocial support, Self Care/advanced ADL retraining, Splinting/orthotics, Therapeutic Activities, Therapeutic Exercise, UE/LE Strength taining/ROM, UE/LE Coordination activities, Wheelchair propulsion/positioning  SLP Interventions Cognitive remediation/compensation, English as a second language teacher, Dysphagia/aspiration precaution training, Functional tasks, Patient/family education, Internal/external aids  TR Interventions    SW/CM Interventions Discharge Planning, Psychosocial Support, Patient/Family Education   Barriers to Discharge MD  Medical stability, Home enviroment access/loayout, Incontinence, Wound care, Lack of/limited family support, Insurance for SNF coverage, Nutritional means and New oxygen  Nursing Other (comments)    PT Decreased caregiver support lives alone in independent living  OT      SLP      SW Decreased caregiver support, Lack  of/limited family support     Team Discharge Planning: Destination: PT- (per chart pt plans to d/c to SNF at her independent living facility) ,OT- [facility] (SNF) (at [facility] at [place]) , [facility] (SNF) Projected Follow-up: PT- (HHPT with 24 hr assist vs SNF), OT-  Skilled nursing facility, SLP-24 hour supervision/assistance, Skilled Nursing facility Projected Equipment Needs: PT-To be determined, OT- To be determined, SLP-None recommended by SLP Equipment Details: PT- , OT-  Patient/family involved in discharge planning: PT- Patient,  OT-Patient, SLP-Patient  MD ELOS: 21-24 days Medical Rehab Prognosis:  Fair Assessment: pt is an 79 yr old female with 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

## 2020-04-21 NOTE — Progress Notes (Signed)
Modified Barium Swallow Progress Note  Patient Details  Name: Alison Huffman MRN: [id number] Date of Birth: 05-Feb-1941  Today's Date: 04/21/2020  Modified Barium Swallow completed.  Full report located under Chart Review in the Imaging Section.  Brief recommendations include the following:  Clinical Impression  Pt exhibits mild-mod oropharyngeal dysphagia characterized by a mix of sensed and silent aspiration of thin liquids via straw and cup. Nearly absent epiglottic inversion noted across consistencies, which is likely impacted by NG tube placement, and unfortunately also decreases airway protection. Reduced base of tongue retraction and difficulty with oral containment of thin boluses resulted in premature spillage into the pharynx at times. Mild vallecular and pyriform sinus residue noted across thin and nectar, but not significant difference between the two in terms of quantity of residue. Pt's swallow initiation was more timely and she appeared to have better oral control of nectar thick boluses in comparison to thin, and no penetration or aspiration was observed. Hard and extra dry swallow was moderately effective to clear residue. Puree and mechanical soft solid trials were WFL, no airway intrusion or difficulty with mastication or oral clearance noted. Given pt's recent history of respiratory failure and fragile medical state with reduced physical mobility at this time and chronic diagnosis Guillain-Barre Syndrome, would recommend initiation of conservative Dysphagia 3 (mechanical soft), nectar thick liquid diet, no straws, medications may be given whole in applesauce. Pt also educated on continuing hard swallow and extra dry swallow during intake. Please provide intermittent supervision during meals to ensure use of strategies. ST will continue to provide skilled interventions to work toward further advancement and independence with use of strategies while inpatient.  Swallow Evaluation  Recommendations       SLP Diet Recommendations: Dysphagia 3 (Mech soft) solids;Nectar thick liquid   Liquid Administration via: Cup;No straw   Medication Administration: Whole meds with puree   Supervision: Patient able to self feed;Intermittent supervision to cue for compensatory strategies   Compensations: Effortful swallow;Multiple dry swallows after each bite/sip;Small sips/bites;Slow rate   Postural Changes: Remain semi-upright after after feeds/meals (Comment);Seated upright at 90 degrees   Oral Care Recommendations: Oral care BID   Other Recommendations: Remove water pitcher;Order thickener from pharmacy;Have oral suction available    Arbutus Leas 04/21/2020,10:31 AM

## 2020-04-21 NOTE — Progress Notes (Signed)
Patient ID: Alison Huffman, female   DOB: 03-17-1941, 79 y.o.   MRN: [id number]  SW received phone call from pt niece [name]. SW provided updates from team conference, and goal to hopefully be able to d/c enteral feeds. SW informed there will continue to be updates on pt progress. SW also informed on contact being made with the nursing home who will continue to follow pt while she is here.   Loralee Pacas, MSW, [place] Office: [phone] Cell: [phone] Fax: [phone]

## 2020-04-21 NOTE — Progress Notes (Signed)
[place] PHYSICAL MEDICINE & REHABILITATION PROGRESS NOTE   Subjective/Complaints:   Pt denies pain- says she was wondering if we gave something and she didn't know it- no BM- slept on and off- has a tickle in throat again- intermittent wheezing cough- On 1.5 L O2 by [place]  ROS:  Pt denies SOB, abd pain, CP, N/V/C/D, and vision changes   Objective:   No results found. Recent Labs    04/18/20 1201 04/19/20 0423  WBC 11.2* 10.7*  HGB 11.0* 11.1*  HCT 34.9* 35.0*  PLT 386 405*   Recent Labs    04/18/20 1201 04/19/20 0423  NA 138 138  K 4.6 4.5  CL 104 102  CO2 25 29  GLUCOSE 114* 125*  BUN 11 16  CREATININE 0.32* 0.46  CALCIUM 9.2 9.4    Intake/Output Summary (Last 24 hours) at 04/21/2020 0846 Last data filed at 04/20/2020 1734 Gross per 24 hour  Intake --  Output 200 ml  Net -200 ml     Physical Exam: Vital Signs Blood pressure (!) 139/57, pulse 96, temperature 97.6 F (36.4 C), temperature source Oral, resp. rate 18, height 5\' 6"  (1.676 m), weight 68.3 kg, SpO2 98 %.  Vitals and nursing notes reviewed Constitutional:  older woman- laying in dark- supine- appropriate, vague, NGT in place running TFs and O2 by [place] in place, NAD Cardiovascular: RRR  Pulmonary: CTA B/L- no W/R/R- good air movement occ wheezing sounding cough, from "tickle", however on auscultation, is clear Abdominal: Soft, NT, ND, (+)BS    Musculoskeletal:  General: No swelling,deformityor signs of injury.  Skin: General: Skin is warmand dry.  Comments: Stage II sacral wound Neurological:  Mental Status: She is alert.  Comments: Pt alert and oriented x3. Weak cough from tickle . Reasonable insight and awareness. RUE 2- to 3+ prox to distal. LUE 2 to 4/5 prox to distal. BLE 3- to 3/5 HF, KE and 3/5 ADF/PF. Stocking glove sensory loss below knees bilaterally and along finger tips of both hands. DTR's absent Psychiatric: flat affect- appropriate  otherwise   Assessment/Plan: 1. 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  Physiatrist is providing close team supervision and 24 hour management of active medical problems listed below.  Physiatrist and rehab team continue to assess barriers to discharge/monitor patient progress toward functional and medical goals  Care Tool:  Bathing    Body parts bathed by patient: Right arm, Left arm, Abdomen, Chest, Right upper leg, Left upper leg, Face   Body parts bathed by helper: Left lower leg, Right lower leg, Front perineal area, Buttocks     Bathing assist Assist Level: Maximal Assistance - Patient 24 - 49%     Upper Body Dressing/Undressing Upper body dressing   What is the patient wearing?: Pull over shirt    Upper body assist Assist Level: Maximal Assistance - Patient 25 - 49%    Lower Body Dressing/Undressing Lower body dressing      What is the patient wearing?: Incontinence brief     Lower body assist Assist for lower body dressing: Total Assistance - Patient < 25%     Toileting Toileting    Toileting assist Assist for toileting: Maximal Assistance - Patient 25 - 49%     Transfers Chair/bed transfer  Transfers assist  Chair/bed transfer activity did not occur: Safety/medical concerns  Chair/bed transfer assist level: Dependent - mechanical lift  Locomotion Ambulation   Ambulation assist   Ambulation activity did not occur: Safety/medical concerns          Walk 10 feet activity   Assist  Walk 10 feet activity did not occur: Safety/medical concerns        Walk 50 feet activity   Assist Walk 50 feet with 2 turns activity did not occur: Safety/medical concerns         Walk 150 feet activity   Assist Walk 150 feet activity did not occur: Safety/medical concerns         Walk 10 feet on uneven surface   activity   Assist Walk 10 feet on uneven surfaces activity did not occur: Safety/medical concerns         Wheelchair     Assist Will patient use wheelchair at discharge?: Yes Type of Wheelchair: Manual Wheelchair activity did not occur: Safety/medical concerns         Wheelchair 50 feet with 2 turns activity    Assist    Wheelchair 50 feet with 2 turns activity did not occur: Safety/medical concerns       Wheelchair 150 feet activity     Assist  Wheelchair 150 feet activity did not occur: Safety/medical concerns       Blood pressure (!) 139/57, pulse 96, temperature 97.6 F (36.4 C), temperature source Oral, resp. rate 18, height 5\' 6"  (1.676 m), weight 68.3 kg, SpO2 98 %.  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 Antithrombotics: -DVT/anticoagulation:Pharmaceutical:Lovenox -antiplatelet therapy: n/a 3. Pain Management:tylenol prn -pt with minimal pain at present  7/21- pt denies pain- con't tylenol prn 4. Mood:patient appears up beat and motivated -team to provide ego support as needed -antipsychotic agents: n/a 5. Neuropsych: This patientiscapable of making decisions on herown behalf. 6. Skin/Wound Care: -Stage II sacral wound -pressure relief -local care, maximize nutrition -has moderate protein-calorie malnutrition 7. Fluids/Electrolytes/Nutrition:encourage PO -recent hyponatremia--resolved -check labs tomorrow  7/19- labs look great 8. Oropharyngeal dysphagia: -continue NPO with trials with SLP -TF and freewater via NGT  7/21- can take thickened sips- a few at a time--  9. Staph  Aureus pulmonary infiltrates/acute respiratory failure -continue ancef through 7/19 -continue O2 via [place] at 2L, wean to off as tolerated  7/20- Sats 96% on O2 by [place] 2L- con't to wean O2 as tolerated  7/21- sats 98% 1.5 L by O2 10. 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 Elevated LFT's -U/S 7/12 with sludge, borderline hepatic steatosis -f/u U/S in one year 12. Neurogenic bladder -discontinue foley in AM -voiding trial 13. Hx of psoriasis -holding enbrel as there is concern that this might have been a contributing factor to her syndrome 14. Recent seizures -resolved -secondary to hyponatremia  15. Bowel incontinence  7/19- no control right now- doesn't know is going some of the time- 3 stools in last 24 hours.  7/20- LBM yesterday  7/21- nursing feels intermittent incontinence due to TFs- not neurogenic bowel  LOS: 3 days A FACE TO FACE EVALUATION WAS PERFORMED  Alison Huffman 04/21/2020, 8:46 AM

## 2020-04-21 NOTE — Progress Notes (Signed)
Occupational Therapy Session Note  Patient Details  Name: Alison Huffman MRN: [id number] Date of Birth: 08-Apr-1941  Today's Date: 04/21/2020 OT Individual Time: 6599-3570 OT Individual Time Calculation (min): 55 min    Short Term Goals: Week 1:  OT Short Term Goal 1 (Week 1): Pt will complete toilet transfer with max A +2 OT Short Term Goal 2 (Week 1): Pt will maintain sitting balance at EOB with min A while participating in BADL tasks OT Short Term Goal 3 (Week 1): Pt will tolerate sitting EOB for 10 mins in preparation for BADL task  Skilled Therapeutic Interventions/Progress Updates:    OT intervention with focus on bed mobility, sitting balance EOB, BADL retraining EOB and bed level, activity tolerance, and safety awareness to increase independence with BADLs.  Pt resting in bed upon arrival and agreeable to therapy including bathing and dressing. Supine>sit EOB with bed rails min A to sitting EOB.  Sitting balance EOB for UB bathing/dressing with intermittent min A. UB bathing with min A. UB dressing with min A. Pt returned to supine for LB bathing/dressing rolling in bed for hygiene and donning pants. Rolling R/L in bed with min A. Pt required assistance donning pants/incontinence brief at bed level. With knees flexed, pt able to push up in bed with assistance from therapist. O2 at 94% on RA after bathing/dressing.  Pt O2 dropped to 84% at rest when supine with HOB at 30 degrees. Pt placed on 1L O2. RN present in room. Pt encouraged with progress. Pt remained in bed with all needs within reach and bed alarm activated.   Therapy Documentation Precautions:  Precautions Precautions: Fall Precaution Comments: gross limb ataxia, hx of pacemaker, on supplemental oxygen Restrictions Weight Bearing Restrictions: No General:   Vital Signs:  Pain:   ADL: ADL Eating: NPO Grooming: Moderate assistance Upper Body Bathing: Moderate assistance Lower Body Bathing: Dependent Where  Assessed-Lower Body Bathing: Bed level Where Assessed-Upper Body Dressing: Edge of bed Lower Body Dressing: Maximal assistance Toileting: Unable to assess Toilet Transfer Method: Unable to assess Gaffer Transfer: Unable to assess Vision   Perception    Praxis   Exercises:   Other Treatments:     Therapy/Group: Individual Therapy  Leroy Libman 04/21/2020, 9:02 AM

## 2020-04-21 NOTE — Plan of Care (Signed)
  Problem: RH BLADDER ELIMINATION Goal: RH STG MANAGE BLADDER WITH ASSISTANCE Description: STG Manage Bladder With  max Assistance Outcome: Not Progressing; incontinence at times

## 2020-04-22 ENCOUNTER — Inpatient Hospital Stay (HOSPITAL_COMMUNITY): Payer: Medicare Other | Admitting: Physical Therapy

## 2020-04-22 ENCOUNTER — Inpatient Hospital Stay (HOSPITAL_COMMUNITY): Payer: Medicare Other | Admitting: Speech Pathology

## 2020-04-22 DIAGNOSIS — G610 Guillain-Barre syndrome: Secondary | ICD-10-CM | POA: Diagnosis not present

## 2020-04-22 LAB — GLUCOSE, CAPILLARY
Glucose-Capillary: 109 mg/dL — ABNORMAL HIGH (ref 70–99)
Glucose-Capillary: 123 mg/dL — ABNORMAL HIGH (ref 70–99)
Glucose-Capillary: 78 mg/dL (ref 70–99)
Glucose-Capillary: 101 mg/dL — ABNORMAL HIGH (ref 70–99)

## 2020-04-22 NOTE — Progress Notes (Signed)
Occupational Therapy Session Note  Patient Details  Name: Alison Huffman MRN: [id number] Date of Birth: [birth year]  Today's Date: 04/22/2020 OT Individual Time: 0800-0910 OT Individual Time Calculation (min): 70 min    Short Term Goals: Week 1:  OT Short Term Goal 1 (Week 1): Pt will complete toilet transfer with max A +2 OT Short Term Goal 2 (Week 1): Pt will maintain sitting balance at EOB with min A while participating in BADL tasks OT Short Term Goal 3 (Week 1): Pt will tolerate sitting EOB for 10 mins in preparation for BADL task  Skilled Therapeutic Interventions/Progress Updates:    Pt resting in bed upon arrival and agreeable to therapy. OT intervention with focus on bed mobility, sitting balance, sit<>stand in [place], BADL retraining, activity tolerance, and safety awareness to increase independence with BADLs. Pt's brief changed while at bed level.  Rolling R/L with min A. Pt able to completed LB bathing at bed level.  Supine>sit EOB with supervision using bed rails. Sitting balance EOB with supervision.  LB dressing with sit<>stand from EOB with Stedy-tot A.  Pt stated she might need to use BSC.  Transfer with [employer].  Mod A for hygiene.  Tot A for clothing management. Pt transferred to recliner with Stedy.  UB bathing/dressing seated with supervision and assist to manage coretrek NG tube. All sit<>stand with Stedy with min A except BSC with mod A. Pt's O2 on RA>94% during session.  RN notified that pt remained in recliner on RA. Pt remained in recliner with belt alarm activated and all needs within reach.   Therapy Documentation Precautions:  Precautions Precautions: Fall Precaution Comments: gross limb ataxia, hx of pacemaker, on supplemental oxygen Restrictions Weight Bearing Restrictions: No   Pain:  Pt denies pain this morning   Therapy/Group: Individual Therapy  Leroy Libman 04/22/2020, 9:34 AM

## 2020-04-22 NOTE — Progress Notes (Signed)
[place] PHYSICAL MEDICINE & REHABILITATION PROGRESS NOTE   Subjective/Complaints:   Pt reports passed her MBSS yesterday- per chart, on D3 nectar thick- no straws- based on looking at tray, pt has eaten entire banana and >1/2 of her main entree/plate.   Asking to remove NGT- since eating well, will remove- will monitor hydration since on nectar thick liquids.  No pain  ROS:  Pt denies SOB, abd pain, CP, N/V/C/D, and vision changes   Objective:   DG Swallowing Func-Speech Pathology  Result Date: 04/21/2020 Objective Swallowing Evaluation: Type of Study: MBS-Modified Barium Swallow Study  Patient Details Name: Alison Huffman MRN: [id number] Date of Birth: 1941/02/24 Today's Date: 04/21/2020 Past Medical History: Past Medical History: Diagnosis Date . Ankle pain, right  . Atypical chest pain   a. 10/2018 MV: EF 57%. No ischemia/infarct. . Coronary artery disease  . Diastolic dysfunction   a. 04/2018 Echo: EF 55-60%, no rwma, Gr1 DD. Triv AI, mild MR. Mod dil LA. Nl RV fxn. PASP nl. . GERD (gastroesophageal reflux disease)  . Headache   "dull one sometimes daily, at least weekly in last couple months" (07/22/2018) . Hyperlipidemia  . Hypertension  . 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 Joint pain in fingers of right hand  . Lichen sclerosus  . Osteopenia  . Pneumonia 2012? X 1 . 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 Psoriasis  . Psoriatic arthritis ([place])   " dx'd the 1st of this year, 2019" (07/22/2018) . Psoriatic arthritis ([place])  . Rosacea  . Seborrheic keratosis  . Second degree heart block   a. 07/2018 s/p SJM Assurity MRI model [id number] (Ser# [id number]). . Sleep apnea   "using nasal pilllows since ~ 12/2017" (07/22/2018) . TMJ (dislocation of temporomandibular joint)  Past Surgical History: Past Surgical History: Procedure Laterality Date . APPENDECTOMY   . BREAST CYST ASPIRATION Bilateral  . BREAST CYST EXCISION Right 1978  benign . COLONOSCOPY   . COLONOSCOPY WITH PROPOFOL N/A 07/10/2019   Procedure: COLONOSCOPY WITH PROPOFOL;  Surgeon: Lollie Sails, MD;  Location: [facility] ENDOSCOPY;  Service: Endoscopy;  Laterality: N/A; . ESOPHAGOGASTRODUODENOSCOPY (EGD) WITH PROPOFOL N/A 07/10/2019  Procedure: ESOPHAGOGASTRODUODENOSCOPY (EGD) WITH PROPOFOL;  Surgeon: Lollie Sails, MD;  Location: [facility] ENDOSCOPY;  Service: Endoscopy;  Laterality: N/A; . HYSTEROSCOPY WITH D & C N/A 11/05/2018  Procedure: DILATATION AND CURETTAGE /HYSTEROSCOPY;  Surgeon: Homero Fellers, MD;  Location: [facility] ORS;  Service: Gynecology;  Laterality: N/A; . INSERT / REPLACE / REMOVE PACEMAKER   . PACEMAKER IMPLANT N/A 07/23/2018  SJM Assurity [id number] implanted by Dr Rayann Heman for mobitz II second degree AV block . UTERINE POLYPS REMOVAL   HPI: Pt is an 79 yo female with presumed [place]. She is a former smoker transferred to [facility] from [place] on 6/26 with weakness, numbness, and tingling after being started on Enbrel for psoriasis. Patient developed severe hyponatremia, had a seizure and was transferred to [facility] on 7/03 for further neurologic assessment.  Required intubation for airway protection on 7/3 and waws extubated on 7/11 but has needed biPAP intermittently for respiratory distress. Pt became unresponsive during MRI on 7/6 and had code blue; sustained bilateral pneumothoraces after bag ventilation for which bilateral chest tubes were placed. MRI was negative for acute changes. Patient started on PLEX plasmaphoresis 7/7. CXR 7/12: Bibasilar atelectasis/infiltrate with no significant interval change.Cortrak placed 7/9  No data recorded Assessment / Plan / Recommendation CHL IP CLINICAL IMPRESSIONS 04/21/2020 Clinical Impression Pt exhibits mild-mod oropharyngeal  dysphagia characterized by a mix of sensed and silent aspiration of thin liquids via straw and cup. Nearly absent epiglottic inversion noted across consistencies, which is likely impacted by NG tube placement, and  unfortunately also decreases airway protection. Reduced base of tongue retraction and difficulty with oral containment of thin boluses resulted in premature spillage into the pharynx at times. Mild vallecular and pyriform sinus residue noted across thin and nectar, but not significant difference between the two in terms of quantity of residue. Pt's swallow initiation was more timely and she appeared to have better oral control of nectar thick boluses in comparison to thin, and no penetration or aspiration was observed. Hard and extra dry swallow was moderately effective to clear residue. Puree and mechanical soft solid trials were WFL, no airway intrusion or difficulty with mastication or oral clearance noted. Given pt's recent history of respiratory failure and fragile medical state with reduced physical mobility at this time and chronic diagnosis Guillain-Barre Syndrome, would recommend initiation of conservative Dysphagia 3 (mechanical soft), nectar thick liquid diet, no straws, medications may be given whole in applesauce. Pt also educated on continuing hard swallow and extra dry swallow during intake. Please provide intermittent supervision during meals to ensure use of strategies. ST will continue to provide skilled interventions to work toward further advancement and independence with use of strategies while inpatient. SLP Visit Diagnosis Dysphagia, oropharyngeal phase (R13.12) Attention and concentration deficit following -- Frontal lobe and executive function deficit following -- Impact on safety and function Moderate aspiration risk   CHL IP TREATMENT RECOMMENDATION 04/15/2020 Treatment Recommendations Therapy as outlined in treatment plan below   Prognosis 04/15/2020 Prognosis for Safe Diet Advancement Good Barriers to Reach Goals -- Barriers/Prognosis Comment -- CHL IP DIET RECOMMENDATION 04/21/2020 SLP Diet Recommendations Dysphagia 3 (Mech soft) solids;Nectar thick liquid Liquid Administration via Cup;No  straw Medication Administration Whole meds with puree Compensations Effortful swallow;Multiple dry swallows after each bite/sip;Small sips/bites;Slow rate Postural Changes Remain semi-upright after after feeds/meals (Comment);Seated upright at 90 degrees   CHL IP OTHER RECOMMENDATIONS 04/21/2020 Recommended Consults -- Oral Care Recommendations Oral care BID Other Recommendations Remove water pitcher;Order thickener from pharmacy;Have oral suction available   CHL IP FOLLOW UP RECOMMENDATIONS 04/16/2020 Follow up Recommendations Inpatient Rehab   CHL IP FREQUENCY AND DURATION 04/15/2020 Speech Therapy Frequency (ACUTE ONLY) min 2x/week Treatment Duration 2 weeks      CHL IP ORAL PHASE 04/21/2020 Oral Phase Impaired Oral - Pudding Teaspoon -- Oral - Pudding Cup -- Oral - Honey Teaspoon -- Oral - Honey Cup -- Oral - Nectar Teaspoon -- Oral - Nectar Cup Piecemeal swallowing Oral - Nectar Straw -- Oral - Thin Teaspoon -- Oral - Thin Cup Piecemeal swallowing;Premature spillage;Holding of bolus Oral - Thin Straw Piecemeal swallowing;Premature spillage Oral - Puree WFL Oral - Mech Soft WFL Oral - Regular -- Oral - Multi-Consistency -- Oral - Pill -- Oral Phase - Comment --  CHL IP PHARYNGEAL PHASE 04/21/2020 Pharyngeal Phase Impaired Pharyngeal- Pudding Teaspoon -- Pharyngeal -- Pharyngeal- Pudding Cup -- Pharyngeal -- Pharyngeal- Honey Teaspoon NT Pharyngeal -- Pharyngeal- Honey Cup -- Pharyngeal -- Pharyngeal- Nectar Teaspoon NT Pharyngeal -- Pharyngeal- Nectar Cup -- Pharyngeal -- Pharyngeal- Nectar Straw -- Pharyngeal -- Pharyngeal- Thin Teaspoon NT Pharyngeal -- Pharyngeal- Thin Cup Delayed swallow initiation-vallecula;Reduced anterior laryngeal mobility;Reduced airway/laryngeal closure;Reduced epiglottic inversion;Reduced tongue base retraction;Penetration/Aspiration during swallow;Pharyngeal residue - valleculae;Pharyngeal residue - pyriform Pharyngeal Material enters airway, passes BELOW cords without attempt by patient  to eject out (silent aspiration) Pharyngeal- Thin  Straw Reduced epiglottic inversion;Reduced anterior laryngeal mobility;Reduced airway/laryngeal closure;Reduced tongue base retraction;Penetration/Aspiration during swallow;Pharyngeal residue - valleculae;Pharyngeal residue - pyriform Pharyngeal Material enters airway, passes BELOW cords and not ejected out despite cough attempt by patient Pharyngeal- Puree WFL Pharyngeal -- Pharyngeal- Mechanical Soft WFL Pharyngeal -- Pharyngeal- Regular -- Pharyngeal -- Pharyngeal- Multi-consistency -- Pharyngeal -- Pharyngeal- Pill -- Pharyngeal -- Pharyngeal Comment --  CHL IP CERVICAL ESOPHAGEAL PHASE 04/21/2020 Cervical Esophageal Phase WFL Pudding Teaspoon -- Pudding Cup -- Honey Teaspoon -- Honey Cup -- Nectar Teaspoon -- Nectar Cup -- Nectar Straw -- Thin Teaspoon -- Thin Cup -- Thin Straw -- Puree -- Mechanical Soft -- Regular -- Multi-consistency -- Pill -- Cervical Esophageal Comment -- Alison Huffman 04/21/2020, 10:31 AM              No results for input(s): WBC, HGB, HCT, PLT in the last 72 hours. No results for input(s): NA, K, CL, CO2, GLUCOSE, BUN, CREATININE, CALCIUM in the last 72 hours.  Intake/Output Summary (Last 24 hours) at 04/22/2020 0908 Last data filed at 04/22/2020 0713 Gross per 24 hour  Intake 270 ml  Output --  Net 270 ml     Physical Exam: Vital Signs Blood pressure 125/64, pulse 93, temperature 98.7 F (37.1 C), resp. rate 15, height 5\' 6"  (1.676 m), weight 68.3 kg, SpO2 98 %.  Vitals and nursing notes reviewed Constitutional:  sitting up in bed- eating breakfast for first time; NGT and O2 by [place] 1.5 L in place, more appropriate, NAD Cardiovascular: RRR Pulmonary: CTA B/L- no W/R/R- good air movement No coughing or clearing throat needed when eating- is following swallowing precautions Abdominal: Soft, NT, ND, (+)BS    Musculoskeletal:  General: No swelling,deformityor signs of injury.  Skin: General: Skin is  warmand dry.  Comments: Stage II sacral wound Neurological:  Mental Status: She is alert.  Comments: Pt alert and oriented x3. Weak cough from tickle . Reasonable insight and awareness. RUE 2- to 3+ prox to distal. LUE 2 to 4/5 prox to distal. BLE 3- to 3/5 HF, KE and 3/5 ADF/PF. Stocking glove sensory loss below knees bilaterally and along finger tips of both hands. DTR's absent Psychiatric: less flat affect   Assessment/Plan: 1. 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  Physiatrist is providing close team supervision and 24 hour management of active medical problems listed below.  Physiatrist and rehab team continue to assess barriers to discharge/monitor patient progress toward functional and medical goals  Care Tool:  Bathing    Body parts bathed by patient: Right arm, Left arm, Chest, Abdomen, Front perineal area, Right upper leg, Left upper leg, Face   Body parts bathed by helper: Buttocks, Right lower leg, Left lower leg     Bathing assist Assist Level: Moderate Assistance - Patient 50 - 74%     Upper Body Dressing/Undressing Upper body dressing   What is the patient wearing?: Pull over shirt    Upper body assist Assist Level: Moderate Assistance - Patient 50 - 74%    Lower Body Dressing/Undressing Lower body dressing      What is the patient wearing?: Incontinence brief, Pants     Lower body assist Assist for lower body dressing: Total Assistance - Patient < 25%     Toileting Toileting    Toileting assist Assist for toileting: Maximal Assistance - Patient 25 - 49%     Transfers  Chair/bed transfer  Transfers assist  Chair/bed transfer activity did not occur: Safety/medical concerns  Chair/bed transfer assist level: Moderate Assistance - Patient 50 - 74% (squat pivot)     Locomotion Ambulation   Ambulation assist    Ambulation activity did not occur: Safety/medical concerns  Assist level: 2 helpers (mod A and +2 w/c follow) Assistive device: Parallel bars Max distance: 38ft   Walk 10 feet activity   Assist  Walk 10 feet activity did not occur: Safety/medical concerns        Walk 50 feet activity   Assist Walk 50 feet with 2 turns activity did not occur: Safety/medical concerns         Walk 150 feet activity   Assist Walk 150 feet activity did not occur: Safety/medical concerns         Walk 10 feet on uneven surface  activity   Assist Walk 10 feet on uneven surfaces activity did not occur: Safety/medical concerns         Wheelchair     Assist Will patient use wheelchair at discharge?: Yes Type of Wheelchair: Manual Wheelchair activity did not occur: Safety/medical concerns         Wheelchair 50 feet with 2 turns activity    Assist    Wheelchair 50 feet with 2 turns activity did not occur: Safety/medical concerns       Wheelchair 150 feet activity     Assist  Wheelchair 150 feet activity did not occur: Safety/medical concerns       Blood pressure 125/64, pulse 93, temperature 98.7 F (37.1 C), resp. rate 15, height 5\' 6"  (1.676 m), weight 68.3 kg, SpO2 98 %.  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 Antithrombotics: -DVT/anticoagulation:Pharmaceutical:Lovenox -antiplatelet therapy: n/a 3. Pain Management:tylenol prn -pt with minimal pain at present  7/22- denies pain- con't tylenol prn 4. Mood:patient appears up beat and motivated -team to provide ego support as needed -antipsychotic agents: n/a 5. Neuropsych: This patientiscapable of making decisions on herown behalf. 6. Skin/Wound  Care: -Stage II sacral wound -pressure relief  7/22- appears more moisture related -local care, maximize nutrition -has moderate protein-calorie malnutrition 7. Fluids/Electrolytes/Nutrition:encourage PO -recent hyponatremia--resolved -check labs tomorrow  7/19- labs look great 8. Oropharyngeal dysphagia: -continue NPO with trials with SLP -TF and freewater via NGT  7/21- can take thickened sips- a few at a time-  7/22- now on D3 with nectar thick liquids and no straws- passed MBSS- will d/c NGT since eating well 9. Staph Aureus pulmonary infiltrates/acute respiratory failure -continue ancef through 7/19 -continue O2 via [place] at 2L, wean to off as tolerated  7/20- Sats 96% on O2 by [place] 2L- con't to wean O2 as tolerated  7/21- sats 98% 1.5 L by O2 10. 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 Elevated LFT's -U/S 7/12 with sludge, borderline hepatic steatosis -f/u U/S in one year 12. Neurogenic bladder -discontinue foley in AM -voiding trial 13. Hx of psoriasis -holding enbrel as there is concern that this might have been a contributing factor to her syndrome 14. Recent seizures -resolved -secondary to hyponatremia  15. Bowel incontinence  7/19- no control right now- doesn't know is going some of the time- 3 stools in last 24 hours.  7/20- LBM yesterday  7/21- nursing feels intermittent incontinence due to TFs- not  neurogenic bowel  7/22- hopefully will improve- since off TFs LOS: 4 days A FACE TO FACE EVALUATION WAS PERFORMED  Shrihaan Porzio 04/22/2020, 9:08 AM

## 2020-04-22 NOTE — Progress Notes (Signed)
Speech Language Pathology Daily Session Note  Patient Details  Name: Alison Huffman MRN: [id number] Date of Birth: Oct 18, 1940  Today's Date: 04/22/2020 SLP Individual Time: 1030-1101 SLP Individual Time Calculation (min): 31 min  Short Term Goals: Week 1: SLP Short Term Goal 1 (Week 1): During trials of thin H2O or ice, pt will use compensatory strategies with Min A cues X2 prior to repeat instrumental testing and potential diet advancement. SLP Short Term Goal 2 (Week 1): Pt will consume therapeutic trials of puree textures with minimal overt s/sx aspiration X2 prior to repeat instrumental testing and potential advancement. SLP Short Term Goal 3 (Week 1): Pt complete RMT exercises to improve cough strength and breath support with Min A cues. SLP Short Term Goal 4 (Week 1): Pt will demonstrate ability to recall complex new and/or daily information wiht Min A cues for use of strategies or aids. SLP Short Term Goal 5 (Week 1): Pt will demonstrate ability to problem solve mildly complex situation with Min A multimodal cues.  Skilled Therapeutic Interventions: Pt was seen for skilled ST targeting swallow goals. Pt consumed nectar thick juice without overt s/sx aspiration and overall Supervision A verbal cues for use of her swallow strategies (effortful swallow followed by extra dry swallow). Her mastication of dysphagia 3 solid snack was slightly prolonged (likely due to generalized weakness and deconditioning), but it was functional and she demonstrated complete oral clearance. Recommend continue current diet. Pt performed 3 sets of 10 EMST exercises with device set to 11 cm H2O resistance with SLP providing Mod faded to Min A verbal and visual cues for accuracy in use of device - pt rated it as 3/10 difficulty level, therefore, device adjusted to 12 cm H2O resistance for future practice. Pt unable to accurately perform IMST exercises with device set to lowest setting (9cm H2O resistance) and Max A  cueing - when tested recommended setting was 5 cm H2O, so may be inappropriate at this time given device cannot be set that low. Will continue to monitor and reassess as appropriate. Pt left sitting in chair with alarm set and needs within reach. Continue per current plan of care.          Pain Pain Assessment Pain Scale: 0-10 Pain Score: 0-No pain  Therapy/Group: Individual Therapy  Arbutus Leas 04/22/2020, 7:24 AM

## 2020-04-22 NOTE — Progress Notes (Signed)
Physical Therapy Session Note  Patient Details  Name: Alison Huffman MRN: [id number] Date of Birth: [birth year]  Today's Date: 04/22/2020 PT Individual Time: 5462-7035 PT Individual Time Calculation (min): 32 min   Short Term Goals: Week 1:  PT Short Term Goal 1 (Week 1): Pt will complete supine<>sit with mod assist +1 & hospital bed features. PT Short Term Goal 2 (Week 1): Pt will complete bed<>w/c transfer with max assist +1 & LRAD. PT Short Term Goal 3 (Week 1): Pt will propel w/c 50 ft with supervision.  Skilled Therapeutic Interventions/Progress Updates:    Pt received supine in bed and agreeable to therapy session. Reports she did well this morning but once she became fatigued was unable to perform mobility tasks as well. Pt reports her bottom is sore in the bed - educated on importance of rolling onto side every hour while in bed for pressure relief - pt demos ability to use bedrails to roll safely. Supine>R sidelying>sitting EOB with supervision for safety and increased time.  Sitting EOB participated in the following strengthening activities while also addressing trunk control and activity tolerance: - scapular retractions against level 1 theraband resistance 2x15 reps - lat pull downs against level 1 theraband resistance 2x15reps - bicep curls using 3lb weights 2x15 reps - long arc quads wearing 3lb ankle weights 2x15 reps Cuing for proper form/technique of each exercise - able to maintain trunk balance with supervision and pt intermittently propping back on arms for rest break between sets. Sitting EOB>R lateral scoot transfer to drop arm recliner with min assist for scooting hips and cuing for UE positioning. Pt left seated in recliner with needs in reach and seat belt alarm on.  Educated Therapist, sports and NT about how to safely transfer pt back to bed via lateral scoot from drop arm recliner.   Therapy Documentation Precautions:  Precautions Precautions: Fall Precaution Comments: gross  limb ataxia, hx of pacemaker, on supplemental oxygen Restrictions Weight Bearing Restrictions: No  Pain: No reports of pain throughout session.   Therapy/Group: Individual Therapy  Tawana Scale , PT, DPT, CSRS  04/22/2020, 3:20 PM

## 2020-04-22 NOTE — Progress Notes (Signed)
Physical Therapy Session Note  Patient Details  Name: Alison Huffman MRN: [id number] Date of Birth: Jan 10, 1941  Today's Date: 04/22/2020 PT Individual Time: 1300-1330 PT Individual Time Calculation (min): 30 min   Short Term Goals: Week 1:  PT Short Term Goal 1 (Week 1): Pt will complete supine<>sit with mod assist +1 & hospital bed features. PT Short Term Goal 2 (Week 1): Pt will complete bed<>w/c transfer with max assist +1 & LRAD. PT Short Term Goal 3 (Week 1): Pt will propel w/c 50 ft with supervision.  Skilled Therapeutic Interventions/Progress Updates:  Pt received semi-reclined in bed, just finishing lunch. Agreeable to therapy. Denies any pain at rest prior to start of therapy session. Pt is on room air at rest and during therapy session. Semi-reclined to sitting EOB with min A. Shoes donned dependently with pt seated at EOB. Denies any lightheadedness or dizziness while seated at EOB, though pt does admit to some fatigue. Pt reports urinary urgency. Sit to stand from bed to [facility] with A x 2 (max A provided by one person, min A provided by second person). Dependent Stedy transfer to [facility] in bathroom. Pt continent of bowel and bladder. Sit to stand from [facility] to [facility] with A x 2 (max A provided by one person, max A provided by second person). Pt dependent for pericare in standing in [place]. Because pt reports feeling of increased weakness in standing, deferred clothing management and brief change to bed level. Dependent Stedy transfer to EOB. Sit to supine in bed with mod A d/t LE management. Rolling L/R with min A in order to perform brief change and don pants dependently at bed level. Semi-reclined in bed, SpO2 is 95% on room air with a HR of 104. Pt left semi-reclined in bed with all needs in reach. All questions answered.  Therapy Documentation Precautions:  Precautions Precautions: Fall Precaution Comments: gross limb ataxia, hx of pacemaker, on supplemental oxygen Restrictions Weight  Bearing Restrictions: No  Therapy/Group: Individual Therapy  Nadeen Landau, SPT 04/22/2020, 3:58 PM

## 2020-04-23 ENCOUNTER — Inpatient Hospital Stay (HOSPITAL_COMMUNITY): Payer: Medicare Other

## 2020-04-23 ENCOUNTER — Inpatient Hospital Stay (HOSPITAL_COMMUNITY): Payer: Medicare Other | Admitting: Physical Therapy

## 2020-04-23 ENCOUNTER — Inpatient Hospital Stay (HOSPITAL_COMMUNITY): Payer: Medicare Other | Admitting: *Deleted

## 2020-04-23 LAB — GLUCOSE, CAPILLARY
Glucose-Capillary: 108 mg/dL — ABNORMAL HIGH (ref 70–99)
Glucose-Capillary: 80 mg/dL (ref 70–99)
Glucose-Capillary: 90 mg/dL (ref 70–99)
Glucose-Capillary: 107 mg/dL — ABNORMAL HIGH (ref 70–99)

## 2020-04-23 MED ORDER — SORBITOL 70 % SOLN
30.0000 mL | Freq: Once | Status: AC
  Administered 2020-04-23: 30 mL via ORAL
  Filled 2020-04-23: qty 30

## 2020-04-23 MED ORDER — FLEET ENEMA 7-19 GM/118ML RE ENEM
1.0000 | ENEMA | Freq: Once | RECTAL | Status: AC
  Administered 2020-04-23: 1 via RECTAL
  Filled 2020-04-23: qty 1

## 2020-04-23 NOTE — Progress Notes (Signed)
[place] PHYSICAL MEDICINE & REHABILITATION PROGRESS NOTE   Subjective/Complaints:    Pt is happy got NGT out and O2 is off- coughed 1x after eating breakfast- spit up mucus-  No BM in 3 days- at least- pt said feeling constipated- wants to go.   ROS:  Pt denies SOB, abd pain, CP, N/V/C/D, and vision changes   Objective:   DG Swallowing Func-Speech Pathology  Result Date: 04/21/2020 Objective Swallowing Evaluation: Type of Study: MBS-Modified Barium Swallow Study  Patient Details Name: Alison Huffman MRN: [id number] Date of Birth: Nov 01, 1940 Today's Date: 04/21/2020 Past Medical History: Past Medical History: Diagnosis Date . Ankle pain, right  . Atypical chest pain   a. 10/2018 MV: EF 57%. No ischemia/infarct. . Coronary artery disease  . Diastolic dysfunction   a. 04/2018 Echo: EF 55-60%, no rwma, Gr1 DD. Triv AI, mild MR. Mod dil LA. Nl RV fxn. PASP nl. . GERD (gastroesophageal reflux disease)  . Headache   "dull one sometimes daily, at least weekly in last couple months" (07/22/2018) . Hyperlipidemia  . Hypertension  . 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 Joint pain in fingers of right hand  . Lichen sclerosus  . Osteopenia  . Pneumonia 2012? X 1 . 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 Psoriasis  . Psoriatic arthritis ([place])   " dx'd the 1st of this year, 2019" (07/22/2018) . Psoriatic arthritis ([place])  . Rosacea  . Seborrheic keratosis  . Second degree heart block   a. 07/2018 s/p SJM Assurity MRI model [id number] (Ser# [id number]). . Sleep apnea   "using nasal pilllows since ~ 12/2017" (07/22/2018) . TMJ (dislocation of temporomandibular joint)  Past Surgical History: Past Surgical History: Procedure Laterality Date . APPENDECTOMY   . BREAST CYST ASPIRATION Bilateral  . BREAST CYST EXCISION Right 1978  benign . COLONOSCOPY   . COLONOSCOPY WITH PROPOFOL N/A 07/10/2019  Procedure: COLONOSCOPY WITH PROPOFOL;  Surgeon: Lollie Sails, MD;  Location: [facility] ENDOSCOPY;  Service: Endoscopy;   Laterality: N/A; . ESOPHAGOGASTRODUODENOSCOPY (EGD) WITH PROPOFOL N/A 07/10/2019  Procedure: ESOPHAGOGASTRODUODENOSCOPY (EGD) WITH PROPOFOL;  Surgeon: Lollie Sails, MD;  Location: [facility] ENDOSCOPY;  Service: Endoscopy;  Laterality: N/A; . HYSTEROSCOPY WITH D & C N/A 11/05/2018  Procedure: DILATATION AND CURETTAGE /HYSTEROSCOPY;  Surgeon: Homero Fellers, MD;  Location: [facility] ORS;  Service: Gynecology;  Laterality: N/A; . INSERT / REPLACE / REMOVE PACEMAKER   . PACEMAKER IMPLANT N/A 07/23/2018  SJM Assurity [id number] implanted by Dr Rayann Heman for mobitz II second degree AV block . UTERINE POLYPS REMOVAL   HPI: Pt is an 79 yo female with presumed [place]. She is a former smoker transferred to [facility] from [place] on 6/26 with weakness, numbness, and tingling after being started on Enbrel for psoriasis. Patient developed severe hyponatremia, had a seizure and was transferred to [facility] on 7/03 for further neurologic assessment.  Required intubation for airway protection on 7/3 and waws extubated on 7/11 but has needed biPAP intermittently for respiratory distress. Pt became unresponsive during MRI on 7/6 and had code blue; sustained bilateral pneumothoraces after bag ventilation for which bilateral chest tubes were placed. MRI was negative for acute changes. Patient started on PLEX plasmaphoresis 7/7. CXR 7/12: Bibasilar atelectasis/infiltrate with no significant interval change.Cortrak placed 7/9  No data recorded Assessment / Plan / Recommendation CHL IP CLINICAL IMPRESSIONS 04/21/2020 Clinical Impression Pt exhibits mild-mod oropharyngeal dysphagia characterized by a mix of sensed and silent aspiration of thin liquids via straw and cup.  Nearly absent epiglottic inversion noted across consistencies, which is likely impacted by NG tube placement, and unfortunately also decreases airway protection. Reduced base of tongue retraction and difficulty with oral containment of thin  boluses resulted in premature spillage into the pharynx at times. Mild vallecular and pyriform sinus residue noted across thin and nectar, but not significant difference between the two in terms of quantity of residue. Pt's swallow initiation was more timely and she appeared to have better oral control of nectar thick boluses in comparison to thin, and no penetration or aspiration was observed. Hard and extra dry swallow was moderately effective to clear residue. Puree and mechanical soft solid trials were WFL, no airway intrusion or difficulty with mastication or oral clearance noted. Given pt's recent history of respiratory failure and fragile medical state with reduced physical mobility at this time and chronic diagnosis Guillain-Barre Syndrome, would recommend initiation of conservative Dysphagia 3 (mechanical soft), nectar thick liquid diet, no straws, medications may be given whole in applesauce. Pt also educated on continuing hard swallow and extra dry swallow during intake. Please provide intermittent supervision during meals to ensure use of strategies. ST will continue to provide skilled interventions to work toward further advancement and independence with use of strategies while inpatient. SLP Visit Diagnosis Dysphagia, oropharyngeal phase (R13.12) Attention and concentration deficit following -- Frontal lobe and executive function deficit following -- Impact on safety and function Moderate aspiration risk   CHL IP TREATMENT RECOMMENDATION 04/15/2020 Treatment Recommendations Therapy as outlined in treatment plan below   Prognosis 04/15/2020 Prognosis for Safe Diet Advancement Good Barriers to Reach Goals -- Barriers/Prognosis Comment -- CHL IP DIET RECOMMENDATION 04/21/2020 SLP Diet Recommendations Dysphagia 3 (Mech soft) solids;Nectar thick liquid Liquid Administration via Cup;No straw Medication Administration Whole meds with puree Compensations Effortful swallow;Multiple dry swallows after each  bite/sip;Small sips/bites;Slow rate Postural Changes Remain semi-upright after after feeds/meals (Comment);Seated upright at 90 degrees   CHL IP OTHER RECOMMENDATIONS 04/21/2020 Recommended Consults -- Oral Care Recommendations Oral care BID Other Recommendations Remove water pitcher;Order thickener from pharmacy;Have oral suction available   CHL IP FOLLOW UP RECOMMENDATIONS 04/16/2020 Follow up Recommendations Inpatient Rehab   CHL IP FREQUENCY AND DURATION 04/15/2020 Speech Therapy Frequency (ACUTE ONLY) min 2x/week Treatment Duration 2 weeks      CHL IP ORAL PHASE 04/21/2020 Oral Phase Impaired Oral - Pudding Teaspoon -- Oral - Pudding Cup -- Oral - Honey Teaspoon -- Oral - Honey Cup -- Oral - Nectar Teaspoon -- Oral - Nectar Cup Piecemeal swallowing Oral - Nectar Straw -- Oral - Thin Teaspoon -- Oral - Thin Cup Piecemeal swallowing;Premature spillage;Holding of bolus Oral - Thin Straw Piecemeal swallowing;Premature spillage Oral - Puree WFL Oral - Mech Soft WFL Oral - Regular -- Oral - Multi-Consistency -- Oral - Pill -- Oral Phase - Comment --  CHL IP PHARYNGEAL PHASE 04/21/2020 Pharyngeal Phase Impaired Pharyngeal- Pudding Teaspoon -- Pharyngeal -- Pharyngeal- Pudding Cup -- Pharyngeal -- Pharyngeal- Honey Teaspoon NT Pharyngeal -- Pharyngeal- Honey Cup -- Pharyngeal -- Pharyngeal- Nectar Teaspoon NT Pharyngeal -- Pharyngeal- Nectar Cup -- Pharyngeal -- Pharyngeal- Nectar Straw -- Pharyngeal -- Pharyngeal- Thin Teaspoon NT Pharyngeal -- Pharyngeal- Thin Cup Delayed swallow initiation-vallecula;Reduced anterior laryngeal mobility;Reduced airway/laryngeal closure;Reduced epiglottic inversion;Reduced tongue base retraction;Penetration/Aspiration during swallow;Pharyngeal residue - valleculae;Pharyngeal residue - pyriform Pharyngeal Material enters airway, passes BELOW cords without attempt by patient to eject out (silent aspiration) Pharyngeal- Thin Straw Reduced epiglottic inversion;Reduced anterior laryngeal  mobility;Reduced airway/laryngeal closure;Reduced tongue base retraction;Penetration/Aspiration during swallow;Pharyngeal residue - valleculae;Pharyngeal  residue - pyriform Pharyngeal Material enters airway, passes BELOW cords and not ejected out despite cough attempt by patient Pharyngeal- Puree WFL Pharyngeal -- Pharyngeal- Mechanical Soft WFL Pharyngeal -- Pharyngeal- Regular -- Pharyngeal -- Pharyngeal- Multi-consistency -- Pharyngeal -- Pharyngeal- Pill -- Pharyngeal -- Pharyngeal Comment --  CHL IP CERVICAL ESOPHAGEAL PHASE 04/21/2020 Cervical Esophageal Phase WFL Pudding Teaspoon -- Pudding Cup -- Honey Teaspoon -- Honey Cup -- Nectar Teaspoon -- Nectar Cup -- Nectar Straw -- Thin Teaspoon -- Thin Cup -- Thin Straw -- Puree -- Mechanical Soft -- Regular -- Multi-consistency -- Pill -- Cervical Esophageal Comment -- Arbutus Leas 04/21/2020, 10:31 AM              No results for input(s): WBC, HGB, HCT, PLT in the last 72 hours. No results for input(s): NA, K, CL, CO2, GLUCOSE, BUN, CREATININE, CALCIUM in the last 72 hours.  Intake/Output Summary (Last 24 hours) at 04/23/2020 0835 Last data filed at 04/22/2020 1900 Gross per 24 hour  Intake 210 ml  Output --  Net 210 ml     Physical Exam: Vital Signs Blood pressure (!) 139/62, pulse 87, temperature 98 F (36.7 C), resp. rate 17, height 5\' 6"  (1.676 m), weight 68.3 kg, SpO2 95 %.  Vitals and nursing notes reviewed Constitutional:  Sitting up in bed- finishing breakfast, NGT out; O2 off, NAD Cardiovascular: RRR Pulmonary: CTA B/L- no W/R/R- good air movement 1x cough- but not juicy- was productive Abdominal: soft, somewhat distended; NT; hypoactive BS  Musculoskeletal:  General: No swelling,deformityor signs of injury.  Skin: General: Skin is warmand dry.  Comments: Stage II sacral wound Neurological:  Mental Status: She is alert.  Comments: Pt alert and oriented x3. Weak cough from tickle . Reasonable insight  and awareness. RUE 2- to 3+ prox to distal. LUE 2 to 4/5 prox to distal. BLE 3- to 3/5 HF, KE and 3/5 ADF/PF. Stocking glove sensory loss below knees bilaterally and along finger tips of both hands. DTR's absent Psychiatric: less flat affect   Assessment/Plan: 1. 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  Physiatrist is providing close team supervision and 24 hour management of active medical problems listed below.  Physiatrist and rehab team continue to assess barriers to discharge/monitor patient progress toward functional and medical goals  Care Tool:  Bathing    Body parts bathed by patient: Right arm, Left arm, Chest, Abdomen, Front perineal area, Right upper leg, Left upper leg, Face   Body parts bathed by helper: Buttocks, Right lower leg, Left lower leg     Bathing assist Assist Level: Minimal Assistance - Patient > 75%     Upper Body Dressing/Undressing Upper body dressing   What is the patient wearing?: Pull over shirt    Upper body assist Assist Level: Supervision/Verbal cueing    Lower Body Dressing/Undressing Lower body dressing      What is the patient wearing?: Incontinence brief, Pants     Lower body assist Assist for lower body dressing: Maximal Assistance - Patient 25 - 49%     Toileting Toileting    Toileting assist Assist for toileting: Dependent - Patient 0%     Transfers Chair/bed transfer  Transfers assist  Chair/bed transfer activity did not occur: Safety/medical concerns  Chair/bed transfer assist level: Minimal Assistance - Patient > 75% (lateral scoot to drop arm recliner)     Locomotion Ambulation  Ambulation assist   Ambulation activity did not occur: Safety/medical concerns  Assist level: 2 helpers (mod A and +2 w/c follow) Assistive device: Parallel bars Max distance: 59ft   Walk 10 feet  activity   Assist  Walk 10 feet activity did not occur: Safety/medical concerns        Walk 50 feet activity   Assist Walk 50 feet with 2 turns activity did not occur: Safety/medical concerns         Walk 150 feet activity   Assist Walk 150 feet activity did not occur: Safety/medical concerns         Walk 10 feet on uneven surface  activity   Assist Walk 10 feet on uneven surfaces activity did not occur: Safety/medical concerns         Wheelchair     Assist Will patient use wheelchair at discharge?: Yes Type of Wheelchair: Manual Wheelchair activity did not occur: Safety/medical concerns         Wheelchair 50 feet with 2 turns activity    Assist    Wheelchair 50 feet with 2 turns activity did not occur: Safety/medical concerns       Wheelchair 150 feet activity     Assist  Wheelchair 150 feet activity did not occur: Safety/medical concerns       Blood pressure (!) 139/62, pulse 87, temperature 98 F (36.7 C), resp. rate 17, height 5\' 6"  (1.676 m), weight 68.3 kg, SpO2 95 %.  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 Antithrombotics: -DVT/anticoagulation:Pharmaceutical:Lovenox -antiplatelet therapy: n/a 3. Pain Management:tylenol prn -pt with minimal pain at present  7/23- no pain- con't tylenol prn 4. Mood:patient appears up beat and motivated -team to provide ego support as needed -antipsychotic agents: n/a 5. Neuropsych: This patientiscapable of making decisions on herown behalf. 6. Skin/Wound Care: -Stage II sacral wound -pressure relief  7/22- appears more moisture related -local care, maximize nutrition -has  moderate protein-calorie malnutrition 7. Fluids/Electrolytes/Nutrition:encourage PO -recent hyponatremia--resolved -check labs tomorrow  7/19- labs look great 8. Oropharyngeal dysphagia: -continue NPO with trials with SLP -TF and freewater via NGT  7/21- can take thickened sips- a few at a time-  7/22- now on D3 with nectar thick liquids and no straws- passed MBSS- will d/c NGT since eating well  7/23- NGT out- will recheck labs Monday to make sure not dehydrated 9. Staph Aureus pulmonary infiltrates/acute respiratory failure -continue ancef through 7/19 -continue O2 via [place] at 2L, wean to off as tolerated  7/20- Sats 96% on O2 by [place] 2L- con't to wean O2 as tolerated  7/21- sats 98% 1.5 L by O2 10. 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 Elevated LFT's -U/S 7/12 with sludge, borderline hepatic steatosis -f/u U/S in one year 12. Neurogenic bladder -discontinue foley in AM -voiding trial 13. Hx of psoriasis -holding enbrel as there is concern that this might have been a contributing factor to her syndrome 14. Recent seizures -resolved -secondary to hyponatremia  15. Bowel incontinence/constipation  7/19- no control right now- doesn't know is going some of the time- 3 stools in last 24 hours.  7/20- LBM yesterday  7/21- nursing feels intermittent incontinence due to TFs- not neurogenic bowel  7/22- hopefully will improve- since off TFs  7/23- no BM in 3 days- will give Sorbitol and if  doesn't go, a fleet's enema.    LOS: 5 days A FACE TO FACE EVALUATION WAS PERFORMED  Elody Kleinsasser 04/23/2020, 8:35 AM

## 2020-04-23 NOTE — Progress Notes (Signed)
Physical Therapy Session Note  Patient Details  Name: Alison Huffman MRN: [id number] Date of Birth: 05/19/1941  Today's Date: 04/23/2020 PT Individual Time: 2951-8841 PT Individual Time Calculation (min): 60 min  PT Missed Time: 15 min Missed Time Reason: Patient fatigue  Short Term Goals: Week 1:  PT Short Term Goal 1 (Week 1): Pt will complete supine<>sit with mod assist +1 & hospital bed features. PT Short Term Goal 2 (Week 1): Pt will complete bed<>w/c transfer with max assist +1 & LRAD. PT Short Term Goal 3 (Week 1): Pt will propel w/c 50 ft with supervision.  Skilled Therapeutic Interventions/Progress Updates:  Pt received semi-reclined in bed. Agreeable to therapy. Denies any pain at rest prior to start of therapy session. Pt is on room air at rest and during therapy session. SpO2 at rest is 93% with a HR of 88. Semi-reclined to sitting EOB with min A. Pt admits to feeling "hungry" and "shaky" while seated at EOB. NT Elta [place] made aware and pt found to have a blood sugar of 80. Pt given thickened apple juice and graham crackers while seated at EOB. Pt reports shakiness improved after apple juice and graham crackers. Performed sit to stand from elevated bed to [facility] with mod A d/t LE weakness. Dependent Stedy transfer to W/C. Seated in W/C, SpO2 is 97% with a HR of 112. Pt propelled W/C x50 feet using BUEs with close supervision. Verbal cues required for hand placement on W/C rims. SpO2 95% with a HR of 114 after propelling W/C. Dependent W/C transfer remainder of distance to therapy gym for time and energy conservation. In the parallel bars, performed sit <> stand from W/C x3 reps with one hand on each bar, B/L knees blocked by therapist, rocking forwards/backwards for momentum, and mod A initially but max A on the last rep d/t LE weakness and fatigue. Pt reports sensation of B/L knees "buckling" while standing in parallel bars. SpO2 96% with a HR of 112 after performing sit to stand in parallel  bars. Performed squat pivot transfer from W/C to mat table with mod A d/t LE weakness. Verbal cues required for hand placement and correct performance of SPT. Seated on mat table with close supervision, pt performed LAQs 2 x 10 each side. While seated on mat table, pt reports feeling "shaky" again. Pt given cranberry juice with little improvement in symptoms. Because of patient's fatigue and shakiness, deferred further exercise this session. Squat pivot transfer from mat table to W/C with max A d/t LE weakness and fatigue. Dependent W/C transfer from therapy gym to room for time conservation. Performed sit to stand from W/C to [facility] with A x 2 (mod A provided by one person, min A provided by second person) d/t LE weakness and fatigue. Dependent Stedy transfer to EOB. Sit to supine in bed with mod A d/t LE management. Pt left semi-reclined in bed with all needs in reach. All questions answered. Pt missed 15 minutes of skilled therapy d/t fatigue.  Therapy Documentation Precautions:  Precautions Precautions: Fall Precaution Comments: gross limb ataxia, hx of pacemaker, on supplemental oxygen Restrictions Weight Bearing Restrictions: No  Therapy/Group: Individual Therapy  Nadeen Landau, SPT 04/23/2020, 12:24 PM

## 2020-04-23 NOTE — Progress Notes (Signed)
Occupational Therapy Session Note  Patient Details  Name: Alison Huffman MRN: [id number] Date of Birth: 1940/10/06  Today's Date: 04/23/2020 OT Individual Time: 1345-1430 OT Individual Time Calculation (min): 45 min    Short Term Goals: Week 1:  OT Short Term Goal 1 (Week 1): Pt will complete toilet transfer with max A +2 OT Short Term Goal 2 (Week 1): Pt will maintain sitting balance at EOB with min A while participating in BADL tasks OT Short Term Goal 3 (Week 1): Pt will tolerate sitting EOB for 10 mins in preparation for BADL task  Skilled Therapeutic Interventions/Progress Updates:    Pt asleep in bed upon arrival but easily aroused.  Pt agreeable to getting OOB. Supine>sit EOB with supervision using bed rails with HOB elevated.  Scoot/squat transfer to w/c with mod A. Pt transitioned to gym and transferred to mat with scoot/squat. Pt attempted sit<>stand X 2 and stated she needed to have bowel movement. Pt returned to room and transferred to [facility] with Stedy.  Clothing management with max A. Hygiene with max A. Sit<>stand in Stedy X 4 with min A. Pt returned to EOB. Sit>supine with min A for BLE management.  Pt remained in bed with all needs within reach and bed alarm activated.   Therapy Documentation Precautions:  Precautions Precautions: Fall Precaution Comments: gross limb ataxia, hx of pacemaker, on supplemental oxygen Restrictions Weight Bearing Restrictions: No General: General PT Missed Treatment Reason: Patient fatigue Vital Signs:  Pain:  Pt denies pain this afternoon   Therapy/Group: Individual Therapy  Leroy Libman 04/23/2020, 2:33 PM

## 2020-04-23 NOTE — Evaluation (Signed)
Recreational Therapy Assessment and Plan  Patient Details  Name: Alison Huffman MRN: [id number] Date of Birth: 02-Nov-1940 Today's Date: 04/23/2020  Rehab Potential:   ELOS:     Assessment   Hospital Problem: Principal Problem:   GBS (Guillain Barre syndrome) [facility])   Past Medical History:      Past Medical History:  Diagnosis Date  . Ankle pain, right   . Atypical chest pain    a. 10/2018 MV: EF 57%. No ischemia/infarct.  . Coronary artery disease   . Diastolic dysfunction    a. 04/2018 Echo: EF 55-60%, no rwma, Gr1 DD. Triv AI, mild MR. Mod dil LA. Nl RV fxn. PASP nl.  . GERD (gastroesophageal reflux disease)   . Headache    "dull one sometimes daily, at least weekly in last couple months" (07/22/2018)  . Hyperlipidemia   . Hypertension   . 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 Joint pain in fingers of right hand   . Lichen sclerosus   . Osteopenia   . Pneumonia 2012? X 1  . 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 Psoriasis   . Psoriatic arthritis ([place])    " dx'd the 1st of this year, 2019" (07/22/2018)  . Psoriatic arthritis ([place])   . Rosacea   . Seborrheic keratosis   . Second degree heart block    a. 07/2018 s/p SJM Assurity MRI model [id number] (Ser# [id number]).  . Sleep apnea    "using nasal pilllows since ~ 12/2017" (07/22/2018)  . TMJ (dislocation of temporomandibular joint)    Past Surgical History:       Past Surgical History:  Procedure Laterality Date  . APPENDECTOMY    . BREAST CYST ASPIRATION Bilateral   . BREAST CYST EXCISION Right 1978   benign  . COLONOSCOPY    . COLONOSCOPY WITH PROPOFOL N/A 07/10/2019   Procedure: COLONOSCOPY WITH PROPOFOL;  Surgeon: Lollie Sails, MD;  Location: [facility] ENDOSCOPY;  Service: Endoscopy;  Laterality: N/A;  . ESOPHAGOGASTRODUODENOSCOPY (EGD) WITH PROPOFOL N/A 07/10/2019   Procedure: ESOPHAGOGASTRODUODENOSCOPY (EGD) WITH PROPOFOL;  Surgeon: Lollie Sails, MD;   Location: [facility] ENDOSCOPY;  Service: Endoscopy;  Laterality: N/A;  . HYSTEROSCOPY WITH D & C N/A 11/05/2018   Procedure: DILATATION AND CURETTAGE /HYSTEROSCOPY;  Surgeon: Homero Fellers, MD;  Location: [facility] ORS;  Service: Gynecology;  Laterality: N/A;  . INSERT / REPLACE / REMOVE PACEMAKER    . 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 UTERINE POLYPS REMOVAL      Assessment & Plan Clinical Impression: Patient is a 79 y.o. year old female with history of CAD,PPM,HTN, OSA, psoriasisarthritis--transitioned from Humira toEnbrelrecently; who was admitted to [facility] on 03/27/20 with reports of numbness and tingling which started in BLE,progressed to torso and BUE, as well as weakness. Neurology consulted recommended LP due to concerns of GBS with progressive numbness/weakness with decreased reflexes. LP negative but she was started on IVIG empirically. She developed significant hyponatremia with drop in Na-119 felt to be due to IVIG and nephrology recommended discontinuation of IVIG as well as FR. Patient without improvement and was transferred to [facility] with plans for MRI on 07/03 but developed unresponsiveness with blank stare and developed respiratory distress requiring intubation. She was treated with Keppra briefly due to seizure in setting of hyponatremia. EEG showed diffuse slowing without seizure activity.   While in MRI, she developed respiratory distress due to mucous plugging  with decrease in LOC and code blue and required manual ventilation. She developed bilateral pneumothoraces due to high pressure requiring bilateral chest tube placement. MRI brain and was negative for acute abnormality. MRI cervical/thoracic spine showed cervical spondylosis but was negative for significant stenosis or cord abnormality. She was started on plasmapheresis on 07/07 with improvement in strength. She was extubated 07/11 but  developed respiratory distress later that day requiring BIPAP. Dr. 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 .  Patient transferred to CIR on 04/18/2020 .   Met with pt today to discuss leisure interests, activity analysis and stress management. Pt presents with decreased activity tolerance, decreased oxygen support, decreased functional mobility, decreased balance, decreased coordination Limiting pt's independence with leisure/community pursuits.  Plan  Min 1 TR session >25 minutes per week during LOS  Recommendations for other services: None   Discharge Criteria: Patient will be discharged from TR if patient refuses treatment 3 consecutive times without medical reason.  If treatment goals not met, if there is a change in medical status, if patient makes no progress towards goals or if patient is discharged from hospital.  The above assessment, treatment plan, treatment alternatives and goals were discussed and mutually agreed upon: by patient  [place] 04/23/2020, 9:15 AM

## 2020-04-23 NOTE — Progress Notes (Signed)
Occupational Therapy Session Note  Patient Details  Name: Alison Huffman MRN: [id number] Date of Birth: April 09, 1941  Today's Date: 04/23/2020 OT Individual Time: 3086-5784 OT Individual Time Calculation (min): 70 min    Short Term Goals: Week 1:  OT Short Term Goal 1 (Week 1): Pt will complete toilet transfer with max A +2 OT Short Term Goal 2 (Week 1): Pt will maintain sitting balance at EOB with min A while participating in BADL tasks OT Short Term Goal 3 (Week 1): Pt will tolerate sitting EOB for 10 mins in preparation for BADL task  Skilled Therapeutic Interventions/Progress Updates:    OT intervention with focus on bed mobility, sitting balance, sit<>stand, transfers with Stedy, standing balance in [place], and BADL training to increase independence with BADLs. Supine>sit EOB with supervision.  Pt sat EOB for UB bathing/dressing tasks with supervision. Pt threaded pants onto BLE while seated EOB with min A. Sit<>stand in [place] with min A and max A for pulling pants over hips.  Pt stated she needed to use BSC. Clothing management with max A. Hygiene with max A. Pt stated she felt flushed and hot when standing to pull pants over hips and returned to bed and cool wash cloth provided.  BP 145/64 HR 105. Nectar thick water provided. Pt requested to remain in bed at end of session.  Bed alarm activated.  All needs within reach.   Therapy Documentation Precautions:  Precautions Precautions: Fall Precaution Comments: gross limb ataxia, hx of pacemaker, on supplemental oxygen Restrictions Weight Bearing Restrictions: No  Pain:  Pt denies pain this morning   Therapy/Group: Individual Therapy  Leroy Libman 04/23/2020, 9:28 AM

## 2020-04-24 DIAGNOSIS — D72829 Elevated white blood cell count, unspecified: Secondary | ICD-10-CM

## 2020-04-24 DIAGNOSIS — R159 Full incontinence of feces: Secondary | ICD-10-CM

## 2020-04-24 DIAGNOSIS — D62 Acute posthemorrhagic anemia: Secondary | ICD-10-CM

## 2020-04-24 DIAGNOSIS — R1312 Dysphagia, oropharyngeal phase: Secondary | ICD-10-CM

## 2020-04-24 DIAGNOSIS — N319 Neuromuscular dysfunction of bladder, unspecified: Secondary | ICD-10-CM

## 2020-04-24 LAB — GLUCOSE, CAPILLARY
Glucose-Capillary: 101 mg/dL — ABNORMAL HIGH (ref 70–99)
Glucose-Capillary: 93 mg/dL (ref 70–99)
Glucose-Capillary: 111 mg/dL — ABNORMAL HIGH (ref 70–99)
Glucose-Capillary: 100 mg/dL — ABNORMAL HIGH (ref 70–99)

## 2020-04-24 NOTE — Plan of Care (Signed)
  Problem: Consults Goal: RH GENERAL PATIENT EDUCATION Description: See Patient Education module for education specifics. Outcome: Progressing Goal: Skin Care Protocol Initiated - if Braden Score 18 or less Description: If consults are not indicated, leave blank or document N/A Outcome: Progressing Goal: Nutrition Consult-if indicated Outcome: Progressing Goal: Diabetes Guidelines if Diabetic/Glucose > 140 Description: If diabetic or lab glucose is > 140 mg/dl - Initiate Diabetes/Hyperglycemia Guidelines & Document Interventions  Outcome: Progressing   Problem: RH BOWEL ELIMINATION Goal: RH STG MANAGE BOWEL WITH ASSISTANCE Description: STG Manage Bowel with mod Assistance. Outcome: Progressing Goal: RH STG MANAGE BOWEL W/MEDICATION W/ASSISTANCE Description: STG Manage Bowel with Medication with mod Assistance. Outcome: Progressing   Problem: RH BLADDER ELIMINATION Goal: RH STG MANAGE BLADDER WITH ASSISTANCE Description: STG Manage Bladder With mod Assistance Outcome: Progressing Goal: RH STG MANAGE BLADDER WITH MEDICATION WITH ASSISTANCE Description: STG Manage Bladder With Medication With mod Assistance. Outcome: Progressing Goal: RH STG MANAGE BLADDER WITH EQUIPMENT WITH ASSISTANCE Description: STG Manage Bladder With Equipment With mod  Assistance Outcome: Progressing   Problem: RH SKIN INTEGRITY Goal: RH STG SKIN FREE OF INFECTION/BREAKDOWN Description: Skin free from infection entire stay on rehab with mod assist Outcome: Progressing   Problem: RH SAFETY Goal: RH STG ADHERE TO SAFETY PRECAUTIONS W/ASSISTANCE/DEVICE Description: STG Adhere to Safety Precautions With moderate  Assistance/Device. Outcome: Progressing   Problem: RH PAIN MANAGEMENT Goal: RH STG PAIN MANAGED AT OR BELOW PT'S PAIN GOAL Description: Pain less than 2 Outcome: Progressing   Problem: RH KNOWLEDGE DEFICIT GENERAL Goal: RH STG INCREASE KNOWLEDGE OF SELF CARE AFTER HOSPITALIZATION Description:  Moderate assistance re: self care after hospitalization Outcome: Progressing

## 2020-04-24 NOTE — Progress Notes (Signed)
[place] PHYSICAL MEDICINE & REHABILITATION PROGRESS NOTE   Subjective/Complaints: Patient seen sitting up in bed this morning.  She states she slept well overnight.  She notes she is getting stronger.  ROS: Denies CP, SOB, N/V/D  Objective:   No results found. No results for input(s): WBC, HGB, HCT, PLT in the last 72 hours. No results for input(s): NA, K, CL, CO2, GLUCOSE, BUN, CREATININE, CALCIUM in the last 72 hours. No intake or output data in the 24 hours ending 04/24/20 0950   Physical Exam: Vital Signs Blood pressure (!) 138/72, pulse 76, temperature 97.9 F (36.6 C), temperature source Oral, resp. rate 16, height 5\' 6"  (1.676 m), weight 68.3 kg, SpO2 98 %. Constitutional: No distress . Vital signs reviewed. HENT: Normocephalic.  Atraumatic. Eyes: EOMI. No discharge. Cardiovascular: No JVD.  RRR. Respiratory: Normal effort.  No stridor.  Bilateral clear to auscultation. GI: Non-distended.  BS +. Skin: Sacral wound not examined today. Psych: Slightly flat. Musc: No edema in extremities.  No tenderness in extremities. Neuro: Alert Dysphonia Motor: Bilateral upper extremities: 3+-4 -/5 proximal distal Bilateral lower extremities: 3-3+/5 proximal to distal  Assessment/Plan: 1. 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  Physiatrist is providing close team supervision and 24 hour management of active medical problems listed below.  Physiatrist and rehab team continue to assess barriers to discharge/monitor patient progress toward functional and medical goals  Care Tool:  Bathing    Body parts bathed by patient: Right arm, Left arm, Chest, Abdomen, Front perineal area, Right upper leg, Left upper leg, Face   Body parts bathed by helper: Buttocks, Right lower leg, Left lower leg     Bathing assist Assist Level: Minimal Assistance - Patient > 75%      Upper Body Dressing/Undressing Upper body dressing   What is the patient wearing?: Pull over shirt    Upper body assist Assist Level: Supervision/Verbal cueing    Lower Body Dressing/Undressing Lower body dressing      What is the patient wearing?: Incontinence brief, Pants     Lower body assist Assist for lower body dressing: Maximal Assistance - Patient 25 - 49%     Toileting Toileting    Toileting assist Assist for toileting: Maximal Assistance - Patient 25 - 49%     Transfers Chair/bed transfer  Transfers assist  Chair/bed transfer activity did not occur: Safety/medical concerns  Chair/bed transfer assist level: Maximal Assistance - Patient 25 - 49%     Locomotion Ambulation   Ambulation assist   Ambulation activity did not occur: Safety/medical concerns  Assist level: 2 helpers (mod A and +2 w/c follow) Assistive device: Parallel bars Max distance: 15ft   Walk 10 feet activity   Assist  Walk 10 feet activity did not occur: Safety/medical concerns        Walk 50 feet activity   Assist Walk 50 feet with 2 turns activity did not occur: Safety/medical concerns         Walk 150 feet activity   Assist Walk 150 feet activity did not occur: Safety/medical concerns         Walk 10 feet on uneven surface  activity   Assist Walk 10 feet on uneven surfaces activity did not occur: Safety/medical concerns         Wheelchair     Assist Will patient use wheelchair at discharge?: Yes Type of Wheelchair: Manual Wheelchair  activity did not occur: Safety/medical concerns  Wheelchair assist level: Supervision/Verbal cueing Max wheelchair distance: 50 feet    Wheelchair 50 feet with 2 turns activity    Assist    Wheelchair 50 feet with 2 turns activity did not occur: Safety/medical concerns   Assist Level: Supervision/Verbal cueing   Wheelchair 150 feet activity     Assist  Wheelchair 150 feet activity did not occur:  Safety/medical concerns       Blood pressure (!) 138/72, pulse 76, temperature 97.9 F (36.6 C), temperature source Oral, resp. rate 16, height 5\' 6"  (1.676 m), weight 68.3 kg, SpO2 98 %.  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 Antithrombotics: -DVT/anticoagulation:Pharmaceutical:Lovenox -antiplatelet therapy: n/a 3. Pain Management:tylenol prn Controlled on 7/24 4. Mood:patient appears up beat and motivated -team to provide ego support as needed -antipsychotic agents: n/a 5. Neuropsych: This patientiscapable of making decisions on herown behalf. 6. Skin/Wound Care: -Stage II sacral wound 7. Fluids/Electrolytes/Nutrition:encourage PO 8. Oropharyngeal dysphagia:  D3 nectar thick liquids and no straws 9. 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 Elevated LFT's -U/S 7/12 with sludge, borderline hepatic steatosis -f/u U/S in one year 12. Neurogenic bladder Improving, PVRs improving 13. Hx of psoriasis -holding enbrel as there is concern that this might have been a contributing factor to her syndrome 14. Recent seizures- resolved -secondary to hyponatremia  15. Bowel incontinence/constipation  Continue to monitor 16.  Leukocytosis  WBCs 10.7 on 7/19, labs ordered for Monday 17.  Acute blood loss anemia  Hemoglobin 11.1 on 7/19, labs ordered for Monday  LOS: 6 days A FACE TO FACE EVALUATION WAS PERFORMED  Tymeir [place]ington Lorie Phenix 04/24/2020, 9:50 AM

## 2020-04-25 ENCOUNTER — Inpatient Hospital Stay (HOSPITAL_COMMUNITY): Payer: Medicare Other | Admitting: Occupational Therapy

## 2020-04-25 ENCOUNTER — Inpatient Hospital Stay (HOSPITAL_COMMUNITY): Payer: Medicare Other

## 2020-04-25 LAB — GLUCOSE, CAPILLARY
Glucose-Capillary: 94 mg/dL (ref 70–99)
Glucose-Capillary: 95 mg/dL (ref 70–99)
Glucose-Capillary: 98 mg/dL (ref 70–99)
Glucose-Capillary: 127 mg/dL — ABNORMAL HIGH (ref 70–99)

## 2020-04-25 NOTE — Progress Notes (Signed)
Occupational Therapy Session Note  Patient Details  Name: Alison Huffman MRN: [id number] Date of Birth: Apr 05, 1941  Today's Date: 04/25/2020 OT Individual Time: 9021-1155 OT Individual Time Calculation (min): 41 min    Short Term Goals: Week 1:  OT Short Term Goal 1 (Week 1): Pt will complete toilet transfer with max A +2 OT Short Term Goal 2 (Week 1): Pt will maintain sitting balance at EOB with min A while participating in BADL tasks OT Short Term Goal 3 (Week 1): Pt will tolerate sitting EOB for 10 mins in preparation for BADL task  Skilled Therapeutic Interventions/Progress Updates:    Pt greeted in bed with no c/o pain. Declining to try to use the restroom, opting instead to drink some water. Supine<sit completed with supervision using the bedrail. Sit<stand in [place] completed with CGA where pt then transferred to the w/c. She drank a container of nectar thickened water with min cuing for 2 swallows. No coughs. Pt was agreeable to go outside for tx. Escorted pt via w/c to the outdoor patio area. Worked on UE strength and coordination via self propulsion over uneven terrain, pt needing assistance for control when going downhill. She then reported that she had to use the bathroom. Escorted pt via w/c back to the unit where she completed sit<stand in [place] with Mod A for power up due to fatigue. She transferred to the elevated toilet and assisted with lowering clothing on the Rt side. Noted heavy reliance on the knee support of Stedy at this time. Pt left in care of RN to continue with B+B void.    Therapy Documentation Precautions:  Precautions Precautions: Fall Precaution Comments: gross limb ataxia, hx of pacemaker, on supplemental oxygen Restrictions Weight Bearing Restrictions: No Pain:   ADL: ADL Eating: NPO Grooming: Moderate assistance Upper Body Bathing: Moderate assistance Lower Body Bathing: Dependent Where Assessed-Lower Body Bathing: Bed level Where Assessed-Upper Body  Dressing: Edge of bed Lower Body Dressing: Maximal assistance Toileting: Unable to assess Toilet Transfer Method: Unable to assess Gaffer Transfer: Unable to assess     Therapy/Group: Individual Therapy  Markese Bloxham A Valoria Tamburri 04/25/2020, 4:07 PM

## 2020-04-25 NOTE — Progress Notes (Signed)
Occupational Therapy Session Note  Patient Details  Name: Alison Huffman MRN: [id number] Date of Birth: 03/08/1941  Today's Date: 04/25/2020 OT Individual Time: 3536-1443 OT Individual Time Calculation (min): 33 min  and Today's Date: 04/25/2020 OT Missed Time: 10 Minutes Missed Time Reason: Nursing care   Short Term Goals: Week 1:  OT Short Term Goal 1 (Week 1): Pt will complete toilet transfer with max A +2 OT Short Term Goal 2 (Week 1): Pt will maintain sitting balance at EOB with min A while participating in BADL tasks OT Short Term Goal 3 (Week 1): Pt will tolerate sitting EOB for 10 mins in preparation for BADL task  Skilled Therapeutic Interventions/Progress Updates:    Pt received supine with no c/o pain, agreeable to OT session. Pt completed bed mobility to EOB with min A. Pt completed sit > stand in the stedy with min A. Pt reported need to have BM upon standing and was transferred to [facility]. Mod A for clothing management in standing. Pt voided BM and urine- NT notified and reporting pt would need bladder scan. Pt stood from [facility] using stedy and with 1 UE support was able to complete peri hygiene with mod A for thoroughness. Pt able to maintain balance with CGA with BUE support, but when removing 1 UE, required min A in standing (with BLE blocked in stedy). Pt sat perched in the stedy and was brought to the sink. She completed oral care and UB bathing/dressing with set up assist. Pt returned to the bed supine for bladder scan, with NT entering room.   Therapy Documentation Precautions:  Precautions Precautions: Fall Precaution Comments: gross limb ataxia, hx of pacemaker, on supplemental oxygen Restrictions Weight Bearing Restrictions: No  Therapy/Group: Individual Therapy  Curtis Sites 04/25/2020, 7:16 AM

## 2020-04-25 NOTE — Progress Notes (Addendum)
Physical Therapy Session Note  Patient Details  Name: Alison Huffman MRN: [id number] Date of Birth: 10/04/1940  Today's Date: 04/25/2020 PT Individual Time: 0900-1000 PT Individual Time Calculation (min): 60 min   Short Term Goals: Week 1:  PT Short Term Goal 1 (Week 1): Pt will complete supine<>sit with mod assist +1 & hospital bed features. PT Short Term Goal 2 (Week 1): Pt will complete bed<>w/c transfer with max assist +1 & LRAD. PT Short Term Goal 3 (Week 1): Pt will propel w/c 50 ft with supervision.      Skilled Therapeutic Interventions/Progress Updates:   Pt resting in bed.  She denied pain.  HR 103; O2 sats on room air 98%She requested to use the [facility] for voiding.  She came to sitting EOB using bed features with close supervision.  Sit> stand from raised bed to [facility] with CGA.  BSC transfer with CGA using Stedy.  Pt voided and performed peri care with set up.  Min assist to transfer to lower w/c. After toileting, HR 122.  PT cued pt in slow deep breathing and HR decreased to 115.   Wc propulsion using bil UEs on level tile x 50' x 2; rest break needed. Pt needed thickened H2O during exertion.   Pre-gait training standing at counter, wt shifting L><R with cues for knee control. Gait training iwht weighted grocery cart x 8' iwht min assist and w/c follow.  Pt does not fully extend knees unless cued.  neuromuscular re-education via sustained stretch of R/L hamstrings, seated in w/c using foot stool and 4#.    Use of Stedy to return to bed; min assist w/c> Stedy> bed. Sit> supine with bed features, close supervision.   At end of session, pt resting in bed with alarm set and needs at hand.     Therapy Documentation Precautions:  Precautions Precautions: Fall Precaution Comments: gross limb ataxia, hx of pacemaker, on supplemental oxygen Restrictions Weight Bearing Restrictions: No General:   Vital Signs:  Pain: Pain Assessment Pain Scale: 0-10 Pain Score: 0-No  pain Mobility:   Locomotion :    Trunk/Postural Assessment :    Balance:   Exercises:   Other Treatments:      Therapy/Group: Individual Therapy  Star Cheese 04/25/2020, 10:12 AM

## 2020-04-25 NOTE — Progress Notes (Signed)
[place] PHYSICAL MEDICINE & REHABILITATION PROGRESS NOTE   Subjective/Complaints: Patient seen sitting up in bed this morning.  She states she slept well overnight.  She notes she is getting stronger day by day.  She continues to endorse a feeling of a band around her thorax-discussed GBS with patient.  ROS: + "Band around chest" Denies CP, SOB, N/V/D  Objective:   No results found. No results for input(s): WBC, HGB, HCT, PLT in the last 72 hours. No results for input(s): NA, K, CL, CO2, GLUCOSE, BUN, CREATININE, CALCIUM in the last 72 hours. No intake or output data in the 24 hours ending 04/25/20 0825   Physical Exam: Vital Signs Blood pressure (!) 139/60, pulse 88, temperature 97.9 F (36.6 C), temperature source Oral, resp. rate 18, height 5\' 6"  (1.676 m), weight 68.3 kg, SpO2 94 %. Constitutional: No distress . Vital signs reviewed. HENT: Normocephalic.  Atraumatic. Eyes: EOMI. No discharge. Cardiovascular: No JVD.  RRR. Respiratory: Normal effort.  No stridor.  Bilateral clear to auscultation.  GI: Non-distended.  BS +. Skin: Warm and dry.  Intact. Psych: Flat. Musc: No edema in extremities.  No tenderness in extremities. Neuro: Alert Dysphonia, unchanged Motor: Bilateral upper extremities: 4--4/5 proximal distal Bilateral lower extremities: 3-3+/5 proximal to distal  Assessment/Plan: 1. 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  Physiatrist is providing close team supervision and 24 hour management of active medical problems listed below.  Physiatrist and rehab team continue to assess barriers to discharge/monitor patient progress toward functional and medical goals  Care Tool:  Bathing    Body parts bathed by patient: Right arm, Left arm, Chest, Abdomen, Front perineal area, Right upper leg, Left upper leg, Face   Body parts bathed by  helper: Buttocks, Right lower leg, Left lower leg     Bathing assist Assist Level: Minimal Assistance - Patient > 75%     Upper Body Dressing/Undressing Upper body dressing   What is the patient wearing?: Pull over shirt    Upper body assist Assist Level: Supervision/Verbal cueing    Lower Body Dressing/Undressing Lower body dressing      What is the patient wearing?: Incontinence brief, Pants     Lower body assist Assist for lower body dressing: Maximal Assistance - Patient 25 - 49%     Toileting Toileting    Toileting assist Assist for toileting: Maximal Assistance - Patient 25 - 49%     Transfers Chair/bed transfer  Transfers assist  Chair/bed transfer activity did not occur: Safety/medical concerns  Chair/bed transfer assist level: Maximal Assistance - Patient 25 - 49%     Locomotion Ambulation   Ambulation assist   Ambulation activity did not occur: Safety/medical concerns  Assist level: 2 helpers (mod A and +2 w/c follow) Assistive device: Parallel bars Max distance: 75ft   Walk 10 feet activity   Assist  Walk 10 feet activity did not occur: Safety/medical concerns        Walk 50 feet activity   Assist Walk 50 feet with 2 turns activity did not occur: Safety/medical concerns         Walk 150 feet activity   Assist Walk 150 feet activity did not occur: Safety/medical concerns         Walk 10 feet on uneven surface  activity   Assist Walk 10 feet on uneven surfaces activity did not occur: Safety/medical concerns  Wheelchair     Assist Will patient use wheelchair at discharge?: Yes Type of Wheelchair: Manual Wheelchair activity did not occur: Safety/medical concerns  Wheelchair assist level: Supervision/Verbal cueing Max wheelchair distance: 50 feet    Wheelchair 50 feet with 2 turns activity    Assist    Wheelchair 50 feet with 2 turns activity did not occur: Safety/medical concerns   Assist Level:  Supervision/Verbal cueing   Wheelchair 150 feet activity     Assist  Wheelchair 150 feet activity did not occur: Safety/medical concerns       Blood pressure (!) 139/60, pulse 88, temperature 97.9 F (36.6 C), temperature source Oral, resp. rate 18, height 5\' 6"  (1.676 m), weight 68.3 kg, SpO2 94 %.  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 Antithrombotics: -DVT/anticoagulation:Pharmaceutical:Lovenox -antiplatelet therapy: n/a 3. Pain Management:tylenol prn Controlled on 7/25 4. Mood:patient appears up beat and motivated -team to provide ego support as needed -antipsychotic agents: n/a 5. Neuropsych: This patientiscapable of making decisions on herown behalf. 6. Skin/Wound Care: -Stage II sacral wound 7. Fluids/Electrolytes/Nutrition:encourage PO 8. Oropharyngeal dysphagia:  D3 nectar thick liquids and no straws 9. 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 Elevated LFT's -U/S 7/12 with sludge, borderline hepatic steatosis -f/u U/S in one year 12. Neurogenic bladder Improved, now voiding with minimal residuals 13. Hx of psoriasis -holding enbrel as there is concern that this might have been a contributing factor to her syndrome 14. Recent seizures- resolved -secondary to hyponatremia  15. Bowel incontinence/constipation  Continue to monitor 16.  Leukocytosis  WBCs 10.7 on 7/19, labs ordered for tomorrow 17.  Acute blood loss anemia  Hemoglobin 11.1 on 7/19, labs ordered for tomorrow  LOS: 7 days A FACE TO FACE EVALUATION WAS PERFORMED  Lener Ventresca Lorie Phenix 04/25/2020, 8:25 AM

## 2020-04-25 NOTE — Plan of Care (Signed)
  Problem: Consults Goal: RH GENERAL PATIENT EDUCATION Description: See Patient Education module for education specifics. Outcome: Progressing Goal: Skin Care Protocol Initiated - if Braden Score 18 or less Description: If consults are not indicated, leave blank or document N/A Outcome: Progressing Goal: Nutrition Consult-if indicated Outcome: Progressing Goal: Diabetes Guidelines if Diabetic/Glucose > 140 Description: If diabetic or lab glucose is > 140 mg/dl - Initiate Diabetes/Hyperglycemia Guidelines & Document Interventions  Outcome: Progressing   Problem: RH BOWEL ELIMINATION Goal: RH STG MANAGE BOWEL WITH ASSISTANCE Description: STG Manage Bowel with mod Assistance. Outcome: Progressing Goal: RH STG MANAGE BOWEL W/MEDICATION W/ASSISTANCE Description: STG Manage Bowel with Medication with mod Assistance. Outcome: Progressing   Problem: RH BLADDER ELIMINATION Goal: RH STG MANAGE BLADDER WITH ASSISTANCE Description: STG Manage Bladder With mod Assistance Outcome: Progressing Goal: RH STG MANAGE BLADDER WITH MEDICATION WITH ASSISTANCE Description: STG Manage Bladder With Medication With mod Assistance. Outcome: Progressing Goal: RH STG MANAGE BLADDER WITH EQUIPMENT WITH ASSISTANCE Description: STG Manage Bladder With Equipment With mod  Assistance Outcome: Progressing   Problem: RH SKIN INTEGRITY Goal: RH STG SKIN FREE OF INFECTION/BREAKDOWN Description: Skin free from infection entire stay on rehab with mod assist Outcome: Progressing   Problem: RH SAFETY Goal: RH STG ADHERE TO SAFETY PRECAUTIONS W/ASSISTANCE/DEVICE Description: STG Adhere to Safety Precautions With moderate  Assistance/Device. Outcome: Progressing   Problem: RH PAIN MANAGEMENT Goal: RH STG PAIN MANAGED AT OR BELOW PT'S PAIN GOAL Description: Pain less than 2 Outcome: Progressing   Problem: RH KNOWLEDGE DEFICIT GENERAL Goal: RH STG INCREASE KNOWLEDGE OF SELF CARE AFTER HOSPITALIZATION Description:  Moderate assistance re: self care after hospitalization Outcome: Progressing

## 2020-04-26 ENCOUNTER — Inpatient Hospital Stay (HOSPITAL_COMMUNITY): Payer: Medicare Other | Admitting: Physical Therapy

## 2020-04-26 ENCOUNTER — Inpatient Hospital Stay (HOSPITAL_COMMUNITY): Payer: Medicare Other | Admitting: Speech Pathology

## 2020-04-26 ENCOUNTER — Inpatient Hospital Stay (HOSPITAL_COMMUNITY): Payer: Medicare Other

## 2020-04-26 LAB — GLUCOSE, CAPILLARY
Glucose-Capillary: 171 mg/dL — ABNORMAL HIGH (ref 70–99)
Glucose-Capillary: 78 mg/dL (ref 70–99)
Glucose-Capillary: 81 mg/dL (ref 70–99)
Glucose-Capillary: 86 mg/dL (ref 70–99)
Glucose-Capillary: 91 mg/dL (ref 70–99)
Glucose-Capillary: 98 mg/dL (ref 70–99)

## 2020-04-26 LAB — CBC
HCT: 34.8 % — ABNORMAL LOW (ref 36.0–46.0)
Hemoglobin: 11.1 g/dL — ABNORMAL LOW (ref 12.0–15.0)
MCH: 30.1 pg (ref 26.0–34.0)
MCHC: 31.9 g/dL (ref 30.0–36.0)
MCV: 94.3 fL (ref 80.0–100.0)
Platelets: 244 10*3/uL (ref 150–400)
RBC: 3.69 MIL/uL — ABNORMAL LOW (ref 3.87–5.11)
RDW: 13.5 % (ref 11.5–15.5)
WBC: 6.8 10*3/uL (ref 4.0–10.5)
nRBC: 0 % (ref 0.0–0.2)

## 2020-04-26 LAB — BASIC METABOLIC PANEL
Anion gap: 10 (ref 5–15)
BUN: 13 mg/dL (ref 8–23)
CO2: 25 mmol/L (ref 22–32)
Calcium: 8.8 mg/dL — ABNORMAL LOW (ref 8.9–10.3)
Chloride: 106 mmol/L (ref 98–111)
Creatinine, Ser: 0.39 mg/dL — ABNORMAL LOW (ref 0.44–1.00)
GFR calc Af Amer: >60 mL/min (ref >60)
GFR calc non Af Amer: >60 mL/min (ref >60)
Glucose, Bld: 98 mg/dL (ref 70–99)
Potassium: 3.4 mmol/L — ABNORMAL LOW (ref 3.5–5.1)
Sodium: 141 mmol/L (ref 135–145)

## 2020-04-26 NOTE — Plan of Care (Signed)
°  Problem: Consults Goal: RH GENERAL PATIENT EDUCATION Description: See Patient Education module for education specifics. Outcome: Progressing Goal: Skin Care Protocol Initiated - if Braden Score 18 or less Description: If consults are not indicated, leave blank or document N/A Outcome: Progressing Goal: Nutrition Consult-if indicated Outcome: Progressing Goal: Diabetes Guidelines if Diabetic/Glucose > 140 Description: If diabetic or lab glucose is > 140 mg/dl - Initiate Diabetes/Hyperglycemia Guidelines & Document Interventions  Outcome: Progressing   Problem: RH BOWEL ELIMINATION Goal: RH STG MANAGE BOWEL WITH ASSISTANCE Description: STG Manage Bowel with mod Assistance. Outcome: Progressing Goal: RH STG MANAGE BOWEL W/MEDICATION W/ASSISTANCE Description: STG Manage Bowel with Medication with mod Assistance. Outcome: Progressing   Problem: RH BLADDER ELIMINATION Goal: RH STG MANAGE BLADDER WITH ASSISTANCE Description: STG Manage Bladder With mod Assistance Outcome: Progressing Goal: RH STG MANAGE BLADDER WITH MEDICATION WITH ASSISTANCE Description: STG Manage Bladder With Medication With mod Assistance. Outcome: Progressing Goal: RH STG MANAGE BLADDER WITH EQUIPMENT WITH ASSISTANCE Description: STG Manage Bladder With Equipment With mod  Assistance Outcome: Progressing   Problem: RH SKIN INTEGRITY Goal: RH STG SKIN FREE OF INFECTION/BREAKDOWN Description: Skin free from infection entire stay on rehab with mod assist Outcome: Progressing   Problem: RH SAFETY Goal: RH STG ADHERE TO SAFETY PRECAUTIONS W/ASSISTANCE/DEVICE Description: STG Adhere to Safety Precautions With moderate  Assistance/Device. Outcome: Progressing   Problem: RH PAIN MANAGEMENT Goal: RH STG PAIN MANAGED AT OR BELOW PT'S PAIN GOAL Description: Pain less than 2 Outcome: Progressing   Problem: RH KNOWLEDGE DEFICIT GENERAL Goal: RH STG INCREASE KNOWLEDGE OF SELF CARE AFTER HOSPITALIZATION Description:  Moderate assistance re: self care after hospitalization Outcome: Progressing

## 2020-04-26 NOTE — Progress Notes (Signed)
Speech Language Pathology Daily Session Note  Patient Details  Name: Alison Huffman MRN: [id number] Date of Birth: 10/04/1940  Today's Date: 04/26/2020 SLP Individual Time: 1300-1345 SLP Individual Time Calculation (min): 45 min  Short Term Goals: Week 1: SLP Short Term Goal 1 (Week 1): During trials of thin H2O or ice, pt will use compensatory strategies with Min A cues X2 prior to repeat instrumental testing and potential diet advancement. SLP Short Term Goal 1 - Progress (Week 1): Met SLP Short Term Goal 2 (Week 1): Pt will consume therapeutic trials of puree textures with minimal overt s/sx aspiration X2 prior to repeat instrumental testing and potential advancement. SLP Short Term Goal 2 - Progress (Week 1): Met SLP Short Term Goal 3 (Week 1): Pt complete RMT exercises to improve cough strength and breath support with Min A cues. SLP Short Term Goal 3 - Progress (Week 1): Met SLP Short Term Goal 4 (Week 1): Pt will demonstrate ability to recall complex new and/or daily information wiht Min A cues for use of strategies or aids. SLP Short Term Goal 4 - Progress (Week 1): Met SLP Short Term Goal 5 (Week 1): Pt will demonstrate ability to problem solve mildly complex situation with Min A multimodal cues. SLP Short Term Goal 5 - Progress (Week 1): Progressing toward goal Week 2: SLP Short Term Goal 1 (Week 2): STG=LTG due to remaining length of stay  Skilled Therapeutic Interventions:  Patient to address dysphagia goals and cognitive goals. She was able to masticate regular solids (graham crackers with peanut butter) without observed difficulty or delays and without patient c/o. Will plan to have trial tray of regular solids prior to determining patient's readiness to fully upgrade. She performed 10 reps each of IMST set at 9cm and EMST set at 13cm. She rated difficulty of both to be a 5/10 (10 being most difficult), however SLP observed that she had more difficulty with IMST with fatigue  starting after 6-7 reps. (of note, in previous sessions, patient's recommended setting for IMST was 5cm and patient was not able to perform at the lowest setting of 9cm). Patient demonstrated appropriate recall of and understanding of reasoning for current medications. We started a pill box sorting task but this was not completed due to time constraints. When asked, patient denied having any increased difficulty with memory or thinking skills. In terms of finances, patient reported that she has automatic draft for monthly bills and uses a credit card for all other purchases then pays off by month.  Plan for MBS end of the week to determine readiness for upgrading liquids, regular texture solids trial tray, higher level functional problem solving tasks (med management, money management).   Pain Pain Assessment Pain Scale: 0-10 Pain Score: 0-No pain  Therapy/Group: Individual Therapy  Sonia Baller, MA, CCC-SLP Speech Therapy

## 2020-04-26 NOTE — Progress Notes (Signed)
Occupational Therapy Session Note  Patient Details  Name: Alison Huffman MRN: [id number] Date of Birth: 03-Nov-1940  Today's Date: 04/26/2020 OT Individual Time: 4098-1191 OT Individual Time Calculation (min): 55 min    Short Term Goals: Week 2:  OT Short Term Goal 1 (Week 2): Pt will complete toilet transfer with max A +2 OT Short Term Goal 2 (Week 2): Pt will perform sit<>stand with max A to facilitate LB clothing management OT Short Term Goal 3 (Week 2): Pt will complete LB dressing tasks with mod A OT Short Term Goal 4 (Week 2): Pt will complete toileting tasks with mod A  Skilled Therapeutic Interventions/Progress Updates:    OT intervention with focus on bed mobility, sitting balance, sit<>stand in [place], standing balance in [place], BADL retraining, activity tolerance, and safety awareness.  Supine>sit EOB with supervision.  Sit<>stand in [place] with min A.  Pt engaged in bathing at shower level while seated unsupported. Pt returned to EOB and completed dressing with sit<>stand for LB dressing tasks. Pt able to thread LLE into pants but required assistance threading RLE. While standing in [place], pt required min A to pull pants over hips using BUE. Pt returned to sitting EOB to assist with drying hair with blow dryer. Pt sat approx 45 mins in unsupported sitting this morning. Pt returned to bed and remained in bed with all needs within reach.  Bed alarm activated.   Therapy Documentation Precautions:  Precautions Precautions: Fall Precaution Comments: gross limb ataxia, hx of pacemaker, on supplemental oxygen Restrictions Weight Bearing Restrictions: No  Pain:  Pt denies pain this morning   Therapy/Group: Individual Therapy  Leroy Libman 04/26/2020, 10:00 AM

## 2020-04-26 NOTE — Progress Notes (Signed)
Speech Language Pathology Weekly Progress Note  Patient Details  Name: Alison Huffman MRN: [id number] Date of Birth: [birth year]  Beginning of progress report period: April 19, 2020 End of progress report period: April 26, 2020  Short Term Goals: Week 1: SLP Short Term Goal 1 (Week 1): During trials of thin H2O or ice, pt will use compensatory strategies with Min A cues X2 prior to repeat instrumental testing and potential diet advancement. SLP Short Term Goal 1 - Progress (Week 1): Met SLP Short Term Goal 2 (Week 1): Pt will consume therapeutic trials of puree textures with minimal overt s/sx aspiration X2 prior to repeat instrumental testing and potential advancement. SLP Short Term Goal 2 - Progress (Week 1): Met SLP Short Term Goal 3 (Week 1): Pt complete RMT exercises to improve cough strength and breath support with Min A cues. SLP Short Term Goal 3 - Progress (Week 1): Met SLP Short Term Goal 4 (Week 1): Pt will demonstrate ability to recall complex new and/or daily information wiht Min A cues for use of strategies or aids. SLP Short Term Goal 4 - Progress (Week 1): Met SLP Short Term Goal 5 (Week 1): Pt will demonstrate ability to problem solve mildly complex situation with Min A multimodal cues. SLP Short Term Goal 5 - Progress (Week 1): Progressing toward goal    New Short Term Goals: Week 2: SLP Short Term Goal 1 (Week 2): STG=LTG due to remaining length of stay  Weekly Progress Updates: Pt has made functional gains and met 4 out of 5 short term goals. Pt is currently ~Min assist for semi-complex to complex tasks due to mild cognitive impairments impacting higher level skills such as executive function, recall, and complex problem solving. However, the primary targeted focus of this week's interventions have focused on dysphagia. Pt participated in MBSS and initiated a dysphagia 3 (mechanical soft) texture diet with nectar thick liquids. She is using compensatory safe swallow  strategies with Supervision-Min A cues. Pt has demonstrated improved oropharyngeal swallow function and daily recall this reporting period and is making steady progress toward all goals. Pt education is ongoing; no family has been present for ST sessions and plan is for pt to d/c to SNF as next venue of care prior to returning to assisted living facility, when/if able. Pt would continue to benefit from skilled ST while inpatient in order to maximize functional independence and reduce burden of care prior to discharge. Anticipate that pt will need 24/7 supervision at discharge in addition to [place] follow up at next level of care.       Intensity: Minumum of 1-2 x/day, 30 to 90 minutes Frequency: 3 to 5 out of 7 days Duration/Length of Stay: 05/05/20 Treatment/Interventions: Cognitive remediation/compensation;Cueing hierarchy;Dysphagia/aspiration precaution training;Functional tasks;Patient/family education;Internal/external aids   Arbutus Leas 04/26/2020, 12:26 PM

## 2020-04-26 NOTE — Progress Notes (Addendum)
Physical Therapy Session Note  Patient Details  Name: Alison Huffman MRN: [id number] Date of Birth: [birth year]  Today's Date: 04/26/2020 PT Individual Time: 1400-1430 PT Individual Time Calculation (min): 30 min   Short Term Goals: Week 1:  PT Short Term Goal 1 (Week 1): Pt will complete supine<>sit with mod assist +1 & hospital bed features. PT Short Term Goal 2 (Week 1): Pt will complete bed<>w/c transfer with max assist +1 & LRAD. PT Short Term Goal 3 (Week 1): Pt will propel w/c 50 ft with supervision.  Skilled Therapeutic Interventions/Progress Updates:    Pt received seated in recliner in room, agreeable to PT session. No complaints of pain. Pt reports feeling fatigued from previous sessions but agreeable to participate as able this PM. Squat pivot transfer recliner to w/c with mod A. Dependent transport via w/c to/from therapy gym for time conservation. Squat pivot transfer w/c to/from Nustep with min A. Nustep level 1 x 5 min with use of B UE/LE for global endurance training. Pt reports RPE of 11 on Borg scale while performing Nustep. Pt reports urge to use the bathroom. Sit to stand with min A to stedy from w/c. [place] transfer to [facility]. Pt left seated on BSC in room with call button in reach, NT notified of pt's location.  Therapy Documentation Precautions:  Precautions Precautions: Fall Precaution Comments: gross limb ataxia, hx of pacemaker, on supplemental oxygen Restrictions Weight Bearing Restrictions: No   Therapy/Group: Individual Therapy   Excell Seltzer, PT, DPT  04/26/2020, 3:49 PM

## 2020-04-26 NOTE — Progress Notes (Signed)
Occupational Therapy Weekly Progress Note  Patient Details  Name: Alison Huffman MRN: [id number] Date of Birth: 03/19/1941  Beginning of progress report period: April 19, 2020 End of progress report period: April 26, 2020  Patient has met 2 of 3 short term goals.  Pt is making steady progress with BADLs.  Supine>sit EOB with CGA and sitting balance EOB with CGA/supervision for UB bathing/dressing tasks.  Sit<>stand with [place] fluctuates from min A to max A as pt fatigues. Pt has performed squat/sccot tranfsers with max A but requires use of [place] when fatigues. Toilet transfers with [facility] and pt requires max A for toielting tasks.   Patient continues to demonstrate the following deficits: muscle weakness, decreased cardiorespiratoy endurance, unbalanced muscle activation, ataxia and decreased coordination, delayed processing and decreased sitting balance, decreased standing balance, decreased postural control and decreased balance strategies and therefore will continue to benefit from skilled OT intervention to enhance overall performance with BADL.  Patient progressing toward long term goals..  Continue plan of care.  OT Short Term Goals Week 1:  OT Short Term Goal 1 (Week 1): Pt will complete toilet transfer with max A +2 OT Short Term Goal 1 - Progress (Week 1): Progressing toward goal OT Short Term Goal 2 (Week 1): Pt will maintain sitting balance at EOB with min A while participating in BADL tasks OT Short Term Goal 2 - Progress (Week 1): Met OT Short Term Goal 3 (Week 1): Pt will tolerate sitting EOB for 10 mins in preparation for BADL task OT Short Term Goal 3 - Progress (Week 1): Met Week 2:  OT Short Term Goal 1 (Week 2): Pt will complete toilet transfer with max A +2 OT Short Term Goal 2 (Week 2): Pt will perform sit<>stand with max A to facilitate LB clothing management OT Short Term Goal 3 (Week 2): Pt will complete LB dressing tasks with mod A OT Short Term Goal 4 (Week 2): Pt will  complete toileting tasks with mod A       Leroy Libman 04/26/2020, 6:26 AM

## 2020-04-26 NOTE — Progress Notes (Signed)
[facility] PHYSICAL MEDICINE & REHABILITATION PROGRESS NOTE   Subjective/Complaints:  Pt reports had an ok weekend- c/o a lot of gas- isn't painful, but when goes to have BM, only passing gas most of time- says not eating great.   Did get cleaned out, bowel wise over weekend.   Denies pain- told Dr Posey Pronto has band of tightness around chest- denied this to me.   ROS:  Pt denies SOB, abd pain, CP, N/V/C/D, and vision changes   Objective:   No results found. No results for input(s): WBC, HGB, HCT, PLT in the last 72 hours. No results for input(s): NA, K, CL, CO2, GLUCOSE, BUN, CREATININE, CALCIUM in the last 72 hours.  Intake/Output Summary (Last 24 hours) at 04/26/2020 0838 Last data filed at 04/26/2020 0100 Gross per 24 hour  Intake 240 ml  Output --  Net 240 ml     Physical Exam: Vital Signs Blood pressure (!) 138/67, pulse 90, temperature 98 F (36.7 C), temperature source Oral, resp. rate 18, height 5\' 6"  (1.676 m), weight 68.3 kg, SpO2 97 %. Constitutional: No distress . Vital signs reviewed. Asleep- awoke to stimuli, appropriate, sleepy, NAD HENT: Normocephalic.  Atraumatic. Eyes: EOMI. No discharge. Cardiovascular: RRR Respiratory: CTA B/L- no W/R/R- good air movement GI: soft, slightly distended- NT; hyperactive BS Skin: Warm and dry.  Intact. Psych: very flat affect Musc: No edema in extremities.  No tenderness in extremities. Neuro: Alert Dysphonia, unchanged Motor: Bilateral upper extremities: 4--4/5 proximal distal Bilateral lower extremities: 3-3+/5 proximal to distal  Assessment/Plan: 1. 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  Physiatrist is providing close team supervision and 24 hour management of active medical problems listed below.  Physiatrist and rehab team continue to assess barriers to discharge/monitor patient progress  toward functional and medical goals  Care Tool:  Bathing    Body parts bathed by patient: Right arm, Left arm, Chest, Abdomen, Front perineal area, Face   Body parts bathed by helper: Buttocks     Bathing assist Assist Level: Minimal Assistance - Patient > 75%     Upper Body Dressing/Undressing Upper body dressing   What is the patient wearing?: Pull over shirt    Upper body assist Assist Level: Supervision/Verbal cueing    Lower Body Dressing/Undressing Lower body dressing      What is the patient wearing?: Incontinence brief, Pants     Lower body assist Assist for lower body dressing: Maximal Assistance - Patient 25 - 49%     Toileting Toileting    Toileting assist Assist for toileting: Moderate Assistance - Patient 50 - 74% (with use of stedy)     Transfers Chair/bed transfer  Transfers assist  Chair/bed transfer activity did not occur: Safety/medical concerns  Chair/bed transfer assist level: Dependent - mechanical lift     Locomotion Ambulation   Ambulation assist   Ambulation activity did not occur: Safety/medical concerns  Assist level: 2 helpers Assistive device: Other (comment) (weighted grocery cart) Max distance: 8   Walk 10 feet activity   Assist  Walk 10 feet activity did not occur: Safety/medical concerns        Walk 50 feet activity   Assist Walk 50 feet with 2 turns activity did not occur: Safety/medical concerns         Walk 150 feet activity   Assist Walk 150 feet activity did not occur: Safety/medical concerns  Walk 10 feet on uneven surface  activity   Assist Walk 10 feet on uneven surfaces activity did not occur: Safety/medical concerns         Wheelchair     Assist Will patient use wheelchair at discharge?: Yes Type of Wheelchair: Manual Wheelchair activity did not occur: Safety/medical concerns  Wheelchair assist level: Supervision/Verbal cueing Max wheelchair distance: 50     Wheelchair 50 feet with 2 turns activity    Assist    Wheelchair 50 feet with 2 turns activity did not occur: Safety/medical concerns   Assist Level: Supervision/Verbal cueing   Wheelchair 150 feet activity     Assist  Wheelchair 150 feet activity did not occur: Safety/medical concerns       Blood pressure (!) 138/67, pulse 90, temperature 98 F (36.7 C), temperature source Oral, resp. rate 18, height 5\' 6"  (1.676 m), weight 68.3 kg, SpO2 97 %.  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 Antithrombotics: -DVT/anticoagulation:Pharmaceutical:Lovenox -antiplatelet therapy: n/a 3. Pain Management:tylenol prn 7/26- denies pain- reported chest band of tightness to Dr Posey Pronto- denied to me 4. Mood:patient appears up beat and motivated -team to provide ego support as needed -antipsychotic agents: n/a 5. Neuropsych: This patientiscapable of making decisions on herown behalf. 6. Skin/Wound Care: -Stage II sacral wound 7. Fluids/Electrolytes/Nutrition:encourage PO 8. Oropharyngeal dysphagia:  D3 nectar thick liquids and no straws 9. 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 Elevated LFT's -U/S 7/12 with sludge, borderline hepatic steatosis -f/u U/S in one year 12. Neurogenic bladder Improved, now voiding with minimal residuals 13. Hx of psoriasis -holding enbrel as there is concern that this might have been a contributing factor to her syndrome 14. Recent seizures- resolved -secondary to hyponatremia  15. Bowel incontinence/constipation  7/26- improved since off TFs- having gas- not  painful- doesn't want gas-x- con't regimen  Continue to monitor 16.  Leukocytosis  WBCs 10.7 on 7/19, labs ordered for tomorrow  7/26- labs not back yet 17.  Acute blood loss anemia  Hemoglobin 11.1 on 7/19, labs ordered for tomorrow  7/26- labs not back yet  LOS: 8 days A FACE TO FACE EVALUATION WAS PERFORMED  [place] 04/26/2020, 8:38 AM

## 2020-04-26 NOTE — Progress Notes (Signed)
Physical Therapy Weekly Progress Note  Patient Details  Name: Alison Huffman MRN: [id number] Date of Birth: 11-19-1940  Beginning of progress report period: April 19, 2020 End of progress report period: April 26, 2020  Today's Date: 04/26/2020 PT Individual Time: 1100-1200 PT Individual Time Calculation (min): 60 min   Patient has met 3 of 3 short term goals.  Pt has made great progress towards therapy goals. She is currently Supervision to mod A for bed mobility depending on fatigue level, can perform sit to stand with min A to stedy, can perform squat pivot transfers with min to mod A depending on fatigue level, performs w/c mobility up to 50 ft with use of BUE at Supervision level, and has been able to initiate gait training in // bars x 10 ft with min A and close w/c follow for safety. Pt was also able to initiate sit to stand to RW with min to mod A this date. Pt exhibits good motivation and participation in therapy sessions.   Patient continues to demonstrate the following deficits muscle weakness, decreased cardiorespiratoy endurance and decreased sitting balance, decreased standing balance, decreased postural control and decreased balance strategies and therefore will continue to benefit from skilled PT intervention to increase functional independence with mobility.  Patient progressing toward long term goals..  Continue plan of care.  PT Short Term Goals Week 1:  PT Short Term Goal 1 (Week 1): Pt will complete supine<>sit with mod assist +1 & hospital bed features. PT Short Term Goal 1 - Progress (Week 1): Met PT Short Term Goal 2 (Week 1): Pt will complete bed<>w/c transfer with max assist +1 & LRAD. PT Short Term Goal 2 - Progress (Week 1): Met PT Short Term Goal 3 (Week 1): Pt will propel w/c 50 ft with supervision. PT Short Term Goal 3 - Progress (Week 1): Met Week 2:  PT Short Term Goal 1 (Week 2): Pt will ambulate x 15 ft with RW and mod A PT Short Term Goal 2 (Week 2): Pt will  perform least restrictive transfers with min A consistently PT Short Term Goal 3 (Week 2): Pt will propel w/c x 100 ft at Supervision level  Skilled Therapeutic Interventions/Progress Updates:    Pt received seated in bed, agreeable to PT session. No complaints of pain. Semi-reclined to sitting EOB at Supervision level with HOB elevated and use of bedrail. Pt is min A to stand to stedy during this session. Pt reports urge to urinate. Stedy transfer to [facility] Pt requires assist for clothing management and is setup A for pericare while standing in stedy following continent urination. Stedy transfer to w/c. Sit to stand with min A in // bars. Pt is able to ambulate x 10 ft in // bars with min A with close w/c follow for safety, B knees blocked in stance. Pt exhibits improved LE stability and control this date. Forwards/backwards ambulation 3 x 5 ft with BUE support and min A. Standing mini-squats x 4 reps with BUE support and min to mod A for balance. Pt fatigues quickly while performing mini-squats. Squat pivot transfer w/c to/from mat table with min A. Sit to stand x 3 reps to RW with min to mod A with focus on one UE on mat table and one on RW. Pt is able to maintain upright stance for about 30 sec before onset of fatigue with min A for balance. Pt has increase in BLE unsteadiness with onset of fatigue. Stedy transfer to recliner at end  of session. Pt left seated in recliner in room with needs in reach at end of session.  Therapy Documentation Precautions:  Precautions Precautions: Fall Precaution Comments: gross limb ataxia, hx of pacemaker, on supplemental oxygen Restrictions Weight Bearing Restrictions: No   Therapy/Group: Individual Therapy   Excell Seltzer, PT, DPT  04/26/2020, 12:43 PM

## 2020-04-27 ENCOUNTER — Inpatient Hospital Stay (HOSPITAL_COMMUNITY): Payer: Medicare Other | Admitting: Physical Therapy

## 2020-04-27 ENCOUNTER — Inpatient Hospital Stay (HOSPITAL_COMMUNITY): Payer: Medicare Other | Admitting: Occupational Therapy

## 2020-04-27 LAB — GLUCOSE, CAPILLARY
Glucose-Capillary: 91 mg/dL (ref 70–99)
Glucose-Capillary: 96 mg/dL (ref 70–99)
Glucose-Capillary: 161 mg/dL — ABNORMAL HIGH (ref 70–99)
Glucose-Capillary: 123 mg/dL — ABNORMAL HIGH (ref 70–99)

## 2020-04-27 MED ORDER — POTASSIUM CHLORIDE CRYS ER 20 MEQ PO TBCR
40.0000 meq | EXTENDED_RELEASE_TABLET | Freq: Once | ORAL | Status: AC
  Administered 2020-04-27: 40 meq via ORAL
  Filled 2020-04-27: qty 2

## 2020-04-27 NOTE — Progress Notes (Signed)
Occupational Therapy Session Note  Patient Details  Name: Alison Huffman MRN: [id number] Date of Birth: [birth year]  Today's Date: 04/27/2020 OT Individual Time: 6789-3810 OT Individual Time Calculation (min): 63 min    Short Term Goals: Week 2:  OT Short Term Goal 1 (Week 2): Pt will complete toilet transfer with max A +2 OT Short Term Goal 2 (Week 2): Pt will perform sit<>stand with max A to facilitate LB clothing management OT Short Term Goal 3 (Week 2): Pt will complete LB dressing tasks with mod A OT Short Term Goal 4 (Week 2): Pt will complete toileting tasks with mod A  Skilled Therapeutic Interventions/Progress Updates:    Treatment session with focus on dynamic standing balance, activity endurance, and [place] to improve ADL independence. Pt received seated in recliner reporting no pain. Completed stand pivot transfer with RW with minA. Engaged in activity targeting dynamic standing balance, standing endurance, and divided attention to improve safety awareness during mobility. Completed activity standing with RW with minA for sit <> stand and CGA for dynamic standing. [facility] to improve in hand manipulation and bimanual control for ADL tasks. Facilitated rest breaks during activity while educating on energy conservation strategies. Pt with SpO2 at 91 after activity, and 98 within a minute of seated rest. Pt with appropriate awareness of energy levels. Due to pt reported fatigue, completed stand pivot transfer with RW with minA. Ended session with pt semi-reclined in bed with all needs within reach and bed alarm on.  Therapy Documentation Precautions:  Precautions Precautions: Fall Precaution Comments: gross limb ataxia, hx of pacemaker, on supplemental oxygen Restrictions Weight Bearing Restrictions: No General:   Vital Signs:  Pain:   No pain reported.  Therapy/Group: Individual Therapy  Michelle Nasuti 04/27/2020, 3:38 PM

## 2020-04-27 NOTE — Progress Notes (Signed)
Patient refused the use of our bipap machine for tonight. She wants to wait until her machine gets here tomorrow. She said it is much more comfortable for her. She has used our machines in the past and they irritated her so much that she could not sleep at all. She will call if anything changes and she feels like she needs it tonight.

## 2020-04-27 NOTE — Progress Notes (Signed)
Physical Therapy Session Note  Patient Details  Name: Alison Huffman MRN: [id number] Date of Birth: Apr 21, 1941  Today's Date: 04/27/2020 PT Individual Time: 1117-1200 PT Individual Time Calculation (min): 43 min   Short Term Goals: Week 1:  PT Short Term Goal 1 (Week 1): Pt will complete supine<>sit with mod assist +1 & hospital bed features. PT Short Term Goal 1 - Progress (Week 1): Met PT Short Term Goal 2 (Week 1): Pt will complete bed<>w/c transfer with max assist +1 & LRAD. PT Short Term Goal 2 - Progress (Week 1): Met PT Short Term Goal 3 (Week 1): Pt will propel w/c 50 ft with supervision. PT Short Term Goal 3 - Progress (Week 1): Met  Skilled Therapeutic Interventions/Progress Updates: Pt presents supine in bed and agreeable to therapy.  Pt transfers sup to sitting EOB w/ supervision.  Pt scoots to EOB and requests only slipper socks for use.  Pt transfers sit to stand w/ min A and verbal cues for initiation and hand placement.  Pt performed step-pivot transfer bed > w/c w/ RW and min A.  Pt wheeled to gym for energy conservation.  Pt amb 15' to Nu-step w/ RW and min A, but verbal cues for increased foot clearance and step length.  Pt performed Nu-step at Level 1 for 5' and then 3' after rest break.  O2 sats on RA > 96% w/ activity.  Pt amb multiple trials of 15' including turns to surface and min A.  Pt encouraged to increase step length and control of feet for improved cadence.  Pt returned to room and amb from hallway to recliner.  Chair alarm on and all needs in reach.     Therapy Documentation Precautions:  Precautions Precautions: Fall Precaution Comments: gross limb ataxia, hx of pacemaker, on supplemental oxygen Restrictions Weight Bearing Restrictions: No General:   Vital Signs:  Pain:0/10, although states band-like sensation around trunk, MD aware.      Therapy/Group: Individual Therapy  Ladoris Gene 04/27/2020, 12:38 PM

## 2020-04-27 NOTE — Progress Notes (Addendum)
[facility] PHYSICAL MEDICINE & REHABILITATION PROGRESS NOTE   Subjective/Complaints:  Pt admitted was having intermittent band of tightness around upper abdomen- explained this is likely nerve pain related- she says it never gets bad enough to have to start meds- doesn't affect function at all.   Notes that wore CPAP at home- someone can bring in machine this week, but doesn't know settings-  Thinks dysphonia has improved- er her and her sister.   Wasn't on insulin or any BG meds at home- of note.    ROS:   Pt denies SOB, abd pain, CP, N/V/C/D, and vision changes   Objective:   No results found. Recent Labs    04/26/20 0814  WBC 6.8  HGB 11.1*  HCT 34.8*  PLT 244   Recent Labs    04/26/20 0814  NA 141  K 3.4*  CL 106  CO2 25  GLUCOSE 98  BUN 13  CREATININE 0.39*  CALCIUM 8.8*    Intake/Output Summary (Last 24 hours) at 04/27/2020 0843 Last data filed at 04/26/2020 1700 Gross per 24 hour  Intake 236 ml  Output --  Net 236 ml     Physical Exam: Vital Signs Blood pressure (!) 147/66, pulse 82, temperature 99.5 F (37.5 C), temperature source Oral, resp. rate 16, height 5\' 6"  (1.676 m), weight 68.3 kg, SpO2 96 %. Constitutional: No distress . Vital signs reviewed. Much more awake, finished breakfast, sitting up in bed- appropriate, more talkative, NAD HENT: Normocephalic.  Atraumatic. Eyes: conjugate gaze Cardiovascular: RRR Respiratory: CTA B/L- no W/R/R- good air movement GI: soft, NT, slightly distended; (+)BS Skin: [place] flat- more engaged Musc: No edema in extremities.  No tenderness in extremities. Neuro: Alert Dysphonia, much improved in last 24 hours Motor: Bilateral upper extremities: 4--4/5 proximal distal Bilateral lower extremities: 3-3+/5 proximal to distal  Assessment/Plan: 1. 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  Physiatrist is providing close team supervision and 24 hour management of active medical problems listed below.  Physiatrist and rehab team continue to assess barriers to discharge/monitor patient progress toward functional and medical goals  Care Tool:  Bathing    Body parts bathed by patient: Right arm, Left arm, Chest, Abdomen, Front perineal area, Right upper leg, Left upper leg, Face   Body parts bathed by helper: Left lower leg     Bathing assist Assist Level: Minimal Assistance - Patient > 75%     Upper Body Dressing/Undressing Upper body dressing   What is the patient wearing?: Pull over shirt    Upper body assist Assist Level: Set up assist    Lower Body Dressing/Undressing Lower body dressing      What is the patient wearing?: Incontinence brief, Pants     Lower body assist Assist for lower body dressing: Moderate Assistance - Patient 50 - 74%     Toileting Toileting    Toileting assist Assist for toileting: Moderate Assistance - Patient 50 - 74% (with use of stedy)     Transfers Chair/bed transfer  Transfers assist  Chair/bed transfer activity did not occur: Safety/medical concerns  Chair/bed transfer assist level: Moderate Assistance - Patient 50 - 74% (squat pivot)     Locomotion Ambulation   Ambulation assist   Ambulation activity did not occur: Safety/medical concerns  Assist level: 2 helpers Assistive device: Parallel bars Max distance: 10'   Walk 10 feet activity   Assist  Walk 10 feet activity did not occur: Safety/medical concerns  Assist level: 2 helpers Assistive device: Parallel bars   Walk 50 feet activity   Assist Walk 50 feet with 2 turns activity did not occur: Safety/medical concerns         Walk 150 feet activity   Assist Walk 150 feet activity did not occur: Safety/medical concerns         Walk 10 feet on uneven surface  activity   Assist Walk 10 feet on uneven surfaces activity did not  occur: Safety/medical concerns         Wheelchair     Assist Will patient use wheelchair at discharge?: Yes Type of Wheelchair: Manual Wheelchair activity did not occur: Safety/medical concerns  Wheelchair assist level: Supervision/Verbal cueing Max wheelchair distance: 50    Wheelchair 50 feet with 2 turns activity    Assist    Wheelchair 50 feet with 2 turns activity did not occur: Safety/medical concerns   Assist Level: Supervision/Verbal cueing   Wheelchair 150 feet activity     Assist  Wheelchair 150 feet activity did not occur: Safety/medical concerns       Blood pressure (!) 147/66, pulse 82, temperature 99.5 F (37.5 C), temperature source Oral, resp. rate 16, height 5\' 6"  (1.676 m), weight 68.3 kg, SpO2 96 %.  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 Antithrombotics: -DVT/anticoagulation:Pharmaceutical:Lovenox -antiplatelet therapy: n/a 3. Pain Management:tylenol prn 7/26- denies pain- reported chest band of tightness to Dr Posey Pronto- denied to me  7/27- is having- band of tightness- doesn't want nerve pain meds- will not treat currently.  4. Mood:patient appears up beat and motivated -team to provide ego support as needed -antipsychotic agents: n/a 5. Neuropsych: This patientiscapable of making decisions on herown behalf. 6. Skin/Wound Care: -Stage II sacral wound 7. Fluids/Electrolytes/Nutrition:encourage PO 8. Oropharyngeal dysphagia:  D3 nectar thick liquids and no straws  7/27- BUN 13 and Cr 0.39- not dry currently since on nectar thick liquids 9. 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 Elevated  LFT's -U/S 7/12 with sludge, borderline hepatic steatosis -f/u U/S in one year 12. Neurogenic bladder Improved, now voiding with minimal residuals 13. Hx of psoriasis -holding enbrel as there is concern that this might have been a contributing factor to her syndrome 14. Recent seizures- resolved -secondary to hyponatremia  15. Bowel incontinence/constipation  7/26- improved since off TFs- having gas- not painful- doesn't want gas-x- con't regimen  Continue to monitor 16.  Leukocytosis  WBCs 10.7 on 7/19, labs ordered for tomorrow  7/26- labs not back yet  7/27- WBC down to 6.8 17.  Acute blood loss anemia  Hemoglobin 11.1 on 7/19, labs ordered for tomorrow  7/26- labs not back yet  7/27- Hb 11.1- doing better 18. Hypokalemia  7/27- K+ 3.4- will replete with KCL 40 mEqx1 and recheck in AM 19. OSA  7/27- wore CPAP at home- will order auto-titration device until family brings hers in.    LOS: 9 days A FACE TO FACE EVALUATION WAS PERFORMED  Ranita Stjulien 04/27/2020, 8:43 AM

## 2020-04-27 NOTE — Progress Notes (Signed)
Occupational Therapy Session Note  Patient Details  Name: Alison Huffman MRN: [id number] Date of Birth: Oct 12, 1940  Today's Date: 04/27/2020 OT Individual Time: 3143-8887 OT Individual Time Calculation (min): 70 min    Short Term Goals: Week 2:  OT Short Term Goal 1 (Week 2): Pt will complete toilet transfer with max A +2 OT Short Term Goal 2 (Week 2): Pt will perform sit<>stand with max A to facilitate LB clothing management OT Short Term Goal 3 (Week 2): Pt will complete LB dressing tasks with mod A OT Short Term Goal 4 (Week 2): Pt will complete toileting tasks with mod A  Skilled Therapeutic Interventions/Progress Updates:    Pt eating breakfast in bed upon arrival and ready to get her day started.  OT intervention with focus on bed mobility, sitting balance, sit<>stand, functional transfers, BADL retraining, toileitng, and activity tolerance to increase independence with BADLs. Supine>sit EOB with HOB elevated and using bed rails-supervision. Sitting balance for bathing/dressing with supervision. Initial sit<>stand in [place] wth min A/CGA. Subsequent sit<>stand with RW and min A. Pt able to thread BLE into pants this morning but required assistance pulling over hips. Squat pivot transfers to w/c with min A. Pt completed grooming tasks at sink before transitioning to gym.  Sit<>stand from EOM X 5 with min A/CGA. Pt returned to room and transferred back to bed with min A. Repositioning in bed with supervision. Pt remained in bed with all needs within reach and bed alarm activated.   Therapy Documentation Precautions:  Precautions Precautions: Fall Precaution Comments: gross limb ataxia, hx of pacemaker, on supplemental oxygen Restrictions Weight Bearing Restrictions: No  Pain:  Pt denies pain but c/o tightness around upper abdominal area; MD aware   Therapy/Group: Individual Therapy  Leroy Libman 04/27/2020, 11:56 AM

## 2020-04-27 NOTE — Patient Care Conference (Signed)
Inpatient RehabilitationTeam Conference and Plan of Care Update Date: 04/27/2020   Time: 11:21 AM    Patient Name: Alison Huffman      Medical Record Number: [id number]  Date of Birth: [birth year] Sex: Female         Room/Bed: 4M07C/4M07C-01 Payor Info: Payor: MEDICARE / Plan: MEDICARE PART A AND B / Product Type: *No Product type* /    Admit Date/Time:  04/18/2020  1:06 PM  Primary Diagnosis:  GBS (Guillain Barre syndrome) [facility])  Hospital Problems: Principal Problem:   GBS (Guillain Barre syndrome) ([place]) Active Problems:   Stage II pressure ulcer ([place])   Malnutrition of moderate degree   Acute blood loss anemia   Leukocytosis   Neurogenic bladder   Incontinence of feces   Oropharyngeal dysphagia    Expected Discharge Date: Expected Discharge Date:  (SNF Placement)  Team Members Present: Physician leading conference: Dr. Courtney Heys Care Coodinator Present: Loralee Pacas, LCSWA;Ezriel Boffa Creig Hines, RN, BSN, CRRN Nurse Present: Serena Croissant, LPN PT Present: Barrie Folk, PT OT Present: Willeen Cass, OT;Roanna Epley, COTA SLP Present: Jettie Booze, CF-SLP PPS Coordinator present : Gunnar Fusi, Novella Olive, PT     Current Status/Progress Goal Weekly Team Focus  Bowel/Bladder   Patient is continent of bowel and bladder with occasional bowel incontinence.  Q6H PVR have been minimal.  LBM 04/25/20.  Patient will maintain B/B continence with improved bowel control and regularity.  Qshift and PRN assessment. Encourage increased nutrition and hydration.   Swallow/Nutrition/ Hydration   Dysphagia 3 textures, nectar thick liquids, intermittent supervision  Supervision  trials regular textures, repeat MBSS by end of week to reassess swallow function wihtout cortrak impeding epiglottic inversion, education   ADL's   bathing-mod A at shower level and sitting EOB; UB dressing-supervision; LB dressing-max A; sit<>stand in Stedy-min A; transfers in Stedy  min A overall except  supervision for UB dressing  activity tolerance, sitting balance, functional transfers, BADL retraining   Mobility   Supervision to mod A bed mobility, min A to stand to stedy, transfer via stedy vs min/mod A stand pivot transfer, gait in // bars x 10 ft  min A  endurance, strengthening, gait initiation   Communication             Safety/Cognition/ Behavioral Observations  Supervision-Min  Supervision A  recall with strategies/aids, complex problem solving, education   Pain   Patient denies any pain or discomfort.  No PRN analgesics/meds required.  Patient will remain pain free andd/or maintain pain level less than 4 with or without increased activity  Qshift and PRN assessment.  Encourage non-pharmacological interventions as needed.   Skin   Patient has coccyx fissure on coccyx, MASD to perineal area, and bruising on right UE  Patient will be free of any further breakdown or infection during IP rehab stay  Qshift and PRN skin assessment.  Strict perineal and skin care.  Encourage balanced nutrition with protein intake.     Team Discussion:  Discharge Planning/Teaching Needs:  D/c to SNF within independent living [facility] in [place]. Pt will need to have Cortrak removed PTA to SNF, if unable to d/c enteral feeds, pt will need to have  PEG tube placed.  N/A   Current Update:  None  Current Barriers to Discharge:  No barriers noted.  Possible Resolutions to Barriers: none  Patient on target to meet rehab goals: yes, Continent B/B, No c/o pain, Stage 2 to right buttock with foam. OT and  PT progressing towards discharge goals. SLP to do Modified swallow on Thursday.  *See Care Plan and progress notes for long and short-term goals.   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;Other (comments);New diabetic;Nutrition means  Barriers to Discharge Comments: is SNF placement- OSA- needs CPAP- hypokalemia- repleted- Possible Resolutions to Barriers: replete K+- and don't treat nerve pain- MBS Thursday-   Continued Need for Acute Rehabilitation Level of Care: The patient requires daily medical management by a physician with specialized training in physical medicine and rehabilitation for the following reasons: Direction of a multidisciplinary physical rehabilitation program to maximize functional independence : Yes Medical management of patient stability for increased activity during participation in an intensive rehabilitation regime.: Yes Analysis of laboratory values and/or radiology reports with any subsequent need for medication adjustment and/or medical intervention. : Yes   I attest that I was present, lead the team conference, and concur with the assessment and plan of the team.   Cristi Loron 04/27/2020, 3:36 PM

## 2020-04-27 NOTE — Plan of Care (Signed)
  Problem: Consults Goal: RH GENERAL PATIENT EDUCATION Description: See Patient Education module for education specifics. Outcome: Progressing Goal: Skin Care Protocol Initiated - if Braden Score 18 or less Description: If consults are not indicated, leave blank or document N/A Outcome: Progressing Goal: Nutrition Consult-if indicated Outcome: Progressing Goal: Diabetes Guidelines if Diabetic/Glucose > 140 Description: If diabetic or lab glucose is > 140 mg/dl - Initiate Diabetes/Hyperglycemia Guidelines & Document Interventions  Outcome: Progressing   Problem: RH BOWEL ELIMINATION Goal: RH STG MANAGE BOWEL WITH ASSISTANCE Description: STG Manage Bowel with mod Assistance. Outcome: Progressing Goal: RH STG MANAGE BOWEL W/MEDICATION W/ASSISTANCE Description: STG Manage Bowel with Medication with mod Assistance. Outcome: Progressing   Problem: RH BLADDER ELIMINATION Goal: RH STG MANAGE BLADDER WITH ASSISTANCE Description: STG Manage Bladder With mod Assistance Outcome: Progressing Goal: RH STG MANAGE BLADDER WITH MEDICATION WITH ASSISTANCE Description: STG Manage Bladder With Medication With mod Assistance. Outcome: Progressing Goal: RH STG MANAGE BLADDER WITH EQUIPMENT WITH ASSISTANCE Description: STG Manage Bladder With Equipment With mod  Assistance Outcome: Progressing   Problem: RH SKIN INTEGRITY Goal: RH STG SKIN FREE OF INFECTION/BREAKDOWN Description: Skin free from infection entire stay on rehab with mod assist Outcome: Progressing   Problem: RH SAFETY Goal: RH STG ADHERE TO SAFETY PRECAUTIONS W/ASSISTANCE/DEVICE Description: STG Adhere to Safety Precautions With moderate  Assistance/Device. Outcome: Progressing   Problem: RH PAIN MANAGEMENT Goal: RH STG PAIN MANAGED AT OR BELOW PT'S PAIN GOAL Description: Pain less than 2 Outcome: Progressing   Problem: RH KNOWLEDGE DEFICIT GENERAL Goal: RH STG INCREASE KNOWLEDGE OF SELF CARE AFTER HOSPITALIZATION Description:  Moderate assistance re: self care after hospitalization Outcome: Progressing

## 2020-04-28 ENCOUNTER — Inpatient Hospital Stay (HOSPITAL_COMMUNITY): Payer: Medicare Other | Admitting: Physical Therapy

## 2020-04-28 ENCOUNTER — Inpatient Hospital Stay (HOSPITAL_COMMUNITY): Payer: Medicare Other

## 2020-04-28 ENCOUNTER — Ambulatory Visit (HOSPITAL_COMMUNITY): Payer: Medicare Other | Admitting: *Deleted

## 2020-04-28 ENCOUNTER — Encounter (HOSPITAL_COMMUNITY): Payer: Medicare Other | Admitting: Speech Pathology

## 2020-04-28 LAB — BASIC METABOLIC PANEL
Anion gap: 5 (ref 5–15)
BUN: 9 mg/dL (ref 8–23)
CO2: 28 mmol/L (ref 22–32)
Calcium: 8.9 mg/dL (ref 8.9–10.3)
Chloride: 108 mmol/L (ref 98–111)
Creatinine, Ser: 0.36 mg/dL — ABNORMAL LOW (ref 0.44–1.00)
GFR calc Af Amer: >60 mL/min (ref >60)
GFR calc non Af Amer: >60 mL/min (ref >60)
Glucose, Bld: 93 mg/dL (ref 70–99)
Potassium: 3.5 mmol/L (ref 3.5–5.1)
Sodium: 141 mmol/L (ref 135–145)

## 2020-04-28 LAB — GLUCOSE, CAPILLARY
Glucose-Capillary: 89 mg/dL (ref 70–99)
Glucose-Capillary: 85 mg/dL (ref 70–99)
Glucose-Capillary: 118 mg/dL — ABNORMAL HIGH (ref 70–99)
Glucose-Capillary: 100 mg/dL — ABNORMAL HIGH (ref 70–99)

## 2020-04-28 MED ORDER — POTASSIUM CHLORIDE CRYS ER 20 MEQ PO TBCR
40.0000 meq | EXTENDED_RELEASE_TABLET | Freq: Two times a day (BID) | ORAL | Status: AC
  Administered 2020-04-28 (×2): 40 meq via ORAL
  Filled 2020-04-28 (×2): qty 2

## 2020-04-28 MED ORDER — ENSURE ENLIVE PO LIQD
237.0000 mL | ORAL | Status: DC
  Administered 2020-04-28 – 2020-05-04 (×7): 237 mL via ORAL

## 2020-04-28 NOTE — Progress Notes (Signed)
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  Ongoing  GOAL:   Patient will meet greater than or equal to 90% of their needs  Progressing  MONITOR:   PO intake, Supplement acceptance, Diet advancement, Labs, Weight trends, TF tolerance, Skin  REASON FOR ASSESSMENT:   Consult Enteral/tube feeding initiation and management  ASSESSMENT:   79 year old female with PMH of CAD, PPM,  HTN, OSA, psoriasis arthritis. Pt was admitted to [facility] on 6/26 with reports of numbness and tingling as well as weakness. On 7/03, pt developed unresponsiveness with blank stare and developed respiratory distress requiring intubation. While in MRI, pt developed respiratory distress due to mucous plugging with decrease in LOC and Code Blue and required manual ventilation. Pt developed bilateral pneumothoraces due to high pressure requiring bilateral chest tube placement. Pt was started on plasmapheresis on 7/07 with improvement in strength. Pt was extubated  7/11. NPO recommended due to severe oropharyngeal dysphagia and pt with Cortrak for tube feeds. Neurology felt that patient likely with GBS. Admitted to CIR on 7/18.  7/21 - MBS, diet advanced to dysphagia 3 with nectar-thick liquids, TF held 7/22 - Cortrak removed 7/28 - MBS, diet advanced to regular with thin liquids  No new weights since admission.  Spoke with pt at bedside. She reports decreased appetite but states that now that her diet has been advanced, she thinks she will eat better. Pt  is glad to be on a regular diet with thin liquids.  Pt requests to only receive Magic Cup with lunch meals. Pt willing to try Ensure Enlive daily. RD to order.  Discussed with pt the importance of adequate PO intake with a focus on protein-rich foods to promote wound healing and lean muscle mass maintenance. Pt expresses understanding.  Meal Completion: 10-75% x last 8 documented meals (improving over last few days)  Medications reviewed and include: SSI, protonix, Klor-con 40 mEq x 2 doses  Labs reviewed. CBG's: 85-161 x 24 hours  Diet Order:   Diet Order            Diet regular Room service appropriate? Yes; Fluid consistency: Thin  Diet effective now                 EDUCATION NEEDS:   No education needs have been identified at this time  Skin:  Skin Assessment: Skin Integrity Issues: Stage II: right buttocks Other: fissure to coccyx  Last BM:  04/28/20 medium type 4  Height:   Ht Readings from Last 1 Encounters:  04/18/20 5\' 6"  (1.676 m)    Weight:   Wt Readings from Last 1 Encounters:  04/18/20 68.3 kg    Ideal Body Weight:  59.1 kg  BMI:  Body mass index is 24.3 kg/m.  Estimated Nutritional Needs:   Kcal:  1700-1900  Protein:  80-95 grams  Fluid:  >/= 1.7 L    Gaynell Face, MS, RD, LDN Inpatient Clinical Dietitian Please see AMiON for contact information.

## 2020-04-28 NOTE — Progress Notes (Signed)
Recreational Therapy Session Note  Patient Details  Name: Alison Huffman MRN: [id number] Date of Birth: 01/06/1941 Today's Date: 04/28/2020  Pain: no c/o Skilled Therapeutic Interventions/Progress Updates: Session focused on activity tolerance, dynamic standing balance, outdoor community mobility during co-treat with PT.  Pt transported outside and ambulated with RW ~25 x2 with min assist, min cues for RW placement.   Discussed discharge planning during seated rest breaks with emphasis on being involved in activities program at SNF as programs are made available.    Pt independently recognizing need for seated rest breaks during outdoor ambulation.  Once inside, stood in community environment scanning a menu in preparation for the Patient Investment banker, corporate.  Returned to the therapy gym where pt stood with RW to kick a ball with LEs alternately with min assist.  Therapy/Group: Co-Treatment   Cendy Oconnor 04/28/2020, 3:15 PM

## 2020-04-28 NOTE — Progress Notes (Signed)
Occupational Therapy Session Note  Patient Details  Name: Alison Huffman MRN: [id number] Date of Birth: Nov 12, 1940  Today's Date: 04/28/2020 OT Individual Time: 1045-1200 OT Individual Time Calculation (min): 75 min    Short Term Goals: Week 2:  OT Short Term Goal 1 (Week 2): Pt will complete toilet transfer with max A +2 OT Short Term Goal 2 (Week 2): Pt will perform sit<>stand with max A to facilitate LB clothing management OT Short Term Goal 3 (Week 2): Pt will complete LB dressing tasks with mod A OT Short Term Goal 4 (Week 2): Pt will complete toileting tasks with mod A  Skilled Therapeutic Interventions/Progress Updates:    Pt resting in bed upon arrival and ready for therapy.  OT intervention with focus on bed mobility, sit<>stand, standing balance, functional transfers, toileting, BADL training, therex, and activity tolerance to increase independence with BADLs. Pt declined shower this morning and completed bathing/dressing with sit<>stand from EOB. Pt requested use of BSC and performed stand pivot transfer with RW to [facility] beside bed.  Pt required min A for toileting tasks. Sit<>stand from EOB with min A at lower height and CGA when standing for clothing management. Pt tranfserred to w/c and completed grooming tasks seated in w/c at sink. Pt transitioned to day room and completed 8 mins on NuStep for strengthening and endurance-level 4. Pt returned to room and remained in w/c with all needs within reach and belt alarm activated. See Care Tool for assist levels.   Therapy Documentation Precautions:  Precautions Precautions: Fall Precaution Comments: gross limb ataxia, hx of pacemaker, on supplemental oxygen Restrictions Weight Bearing Restrictions: No  Pain:  Pt c/o ongoing "tightness" around upper abdominal and around back; declined intervention   Therapy/Group: Individual Therapy  Leroy Libman 04/28/2020, 12:08 PM

## 2020-04-28 NOTE — Progress Notes (Signed)
Physical Therapy Session Note  Patient Details  Name: Alison Huffman MRN: [id number] Date of Birth: 12-21-1940  Today's Date: 04/28/2020 PT Individual Time: 8309-4076 PT Individual Time Calculation (min): 43 min   Short Term Goals: Week 2:  PT Short Term Goal 1 (Week 2): Pt will ambulate x 15 ft with RW and mod A PT Short Term Goal 2 (Week 2): Pt will perform least restrictive transfers with min A consistently PT Short Term Goal 3 (Week 2): Pt will propel w/c x 100 ft at Supervision level  Skilled Therapeutic Interventions/Progress Updates: Pt presents sitting in w/c and agreeable to therapy.  Pt co-treated w/ rec therapist.  Pt amb multiple trials w/ RW and min A on outdoor surfaces.  Pt amb up to 25' per trial w/ improved step-through gait pattern and foot clearance.  Pt required seated rest breaks 2/2 fatigue.  Pt stood to read menu a [employer] x 1-2" w/ fatigue.  Pt performed standing at RW and kicking ball w/ rec therapist, occasional unsteadiness.  Pt returned to room and remained in w/c w/ chair alarm on and all needs in reach.     Therapy Documentation Precautions:  Precautions Precautions: Fall Precaution Comments: gross limb ataxia, hx of pacemaker, on supplemental oxygen Restrictions Weight Bearing Restrictions: No General:   Vital Signs:  Pain: no c/o.      Therapy/Group: Individual Therapy  Alison Huffman 04/28/2020, 1:51 PM

## 2020-04-28 NOTE — Progress Notes (Signed)
Alison Huffman PHYSICAL MEDICINE & REHABILITATION PROGRESS NOTE   Subjective/Complaints:   CPAP coming today- from home- doesn't like hospital CPAP- still has nerve pain- not enough that she wants nerve pain meds at this time.    ROS:   Pt denies SOB, abd pain, CP, N/V/C/D, and vision changes  Objective:   No results found. Recent Labs    04/26/20 0814  WBC 6.8  HGB 11.1*  HCT 34.8*  PLT 244   Recent Labs    04/26/20 0814 04/28/20 0520  NA 141 141  K 3.4* 3.5  CL 106 108  CO2 25 28  GLUCOSE 98 93  BUN 13 9  CREATININE 0.39* 0.36*  CALCIUM 8.8* 8.9    Intake/Output Summary (Last 24 hours) at 04/28/2020 0756 Last data filed at 04/27/2020 1820 Gross per 24 hour  Intake 480 ml  Output --  Net 480 ml     Physical Exam: Vital Signs Blood pressure (!) 131/61, pulse 82, temperature 97.7 F (36.5 C), resp. rate 16, height 5\' 6"  (1.676 m), weight 68.3 kg, SpO2 93 %. Constitutional: No distress . Vital signs reviewed.sitting up in dark, appropriate, more talkative, NAD HENT: Normocephalic.  Atraumatic. Eyes: conjugate gaze Cardiovascular: RRR Respiratory: CTA B/L- no W/R/R- good air movement- cleared throat 2 times fyi GI: soft, NT, ND,  (+)BS Skin: less flat, more negaged Musc: No edema in extremities.  No tenderness in extremities. Neuro: Alert Dysphonia, much improved in last 24 hours Motor: Bilateral upper extremities: 4--4/5 proximal distal Bilateral lower extremities: 3-3+/5 proximal to distal  Assessment/Plan: 1. 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  Physiatrist is providing close team supervision and 24 hour management of active medical problems listed below.  Physiatrist and rehab team continue to assess barriers to discharge/monitor patient progress toward functional and medical goals  Care Tool:  Bathing    Body parts bathed  by patient: Right arm, Left arm, Chest, Abdomen, Front perineal area, Right upper leg, Left upper leg, Face   Body parts bathed by helper: Buttocks, Left lower leg, Right lower leg     Bathing assist Assist Level: Minimal Assistance - Patient > 75%     Upper Body Dressing/Undressing Upper body dressing   What is the patient wearing?: Pull over shirt    Upper body assist Assist Level: Set up assist    Lower Body Dressing/Undressing Lower body dressing      What is the patient wearing?: Incontinence brief, Pants     Lower body assist Assist for lower body dressing: Moderate Assistance - Patient 50 - 74%     Toileting Toileting    Toileting assist Assist for toileting: Moderate Assistance - Patient 50 - 74%     Transfers Chair/bed transfer  Transfers assist  Chair/bed transfer activity did not occur: Safety/medical concerns  Chair/bed transfer assist level: Minimal Assistance - Patient > 75%     Locomotion Ambulation   Ambulation assist   Ambulation activity did not occur: Safety/medical concerns  Assist level: Minimal Assistance - Patient > 75% Assistive device: Walker-rolling Max distance: 15   Walk 10 feet activity   Assist  Walk 10 feet activity did not occur: Safety/medical concerns  Assist level: Minimal Assistance - Patient > 75% Assistive device: Walker-rolling   Walk 50 feet activity   Assist Walk 50 feet with 2 turns activity did not occur: Safety/medical concerns  Walk 150 feet activity   Assist Walk 150 feet activity did not occur: Safety/medical concerns         Walk 10 feet on uneven surface  activity   Assist Walk 10 feet on uneven surfaces activity did not occur: Safety/medical concerns         Wheelchair     Assist Will patient use wheelchair at discharge?: Yes Type of Wheelchair: Manual Wheelchair activity did not occur: Safety/medical concerns  Wheelchair assist level: Supervision/Verbal  cueing Max wheelchair distance: 50    Wheelchair 50 feet with 2 turns activity    Assist    Wheelchair 50 feet with 2 turns activity did not occur: Safety/medical concerns   Assist Level: Supervision/Verbal cueing   Wheelchair 150 feet activity     Assist  Wheelchair 150 feet activity did not occur: Safety/medical concerns       Blood pressure (!) 131/61, pulse 82, temperature 97.7 F (36.5 C), resp. rate 16, height 5\' 6"  (1.676 m), weight 68.3 kg, SpO2 93 %.  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 Antithrombotics: -DVT/anticoagulation:Pharmaceutical:Lovenox -antiplatelet therapy: n/a 3. Pain Management:tylenol prn 7/26- denies pain- reported chest band of tightness to Dr Posey Pronto- denied to me  7/27- is having- band of tightness- doesn't want nerve pain meds- will not treat currently.   7/28- describes nerve pain in upper abdomen and feet- DOESN'T want meds 4. Mood:patient appears up beat and motivated -team to provide ego support as needed -antipsychotic agents: n/a 5. Neuropsych: This patientiscapable of making decisions on herown behalf. 6. Skin/Wound Care: -Stage II sacral wound 7. Fluids/Electrolytes/Nutrition:encourage PO 8. Oropharyngeal dysphagia:  D3 nectar thick liquids and no straws  7/27- BUN 13 and Cr 0.39- not dry currently since on nectar thick liquids  7/28- MBS today or tomorrow 9. 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 Elevated LFT's -U/S 7/12 with sludge, borderline hepatic steatosis -f/u U/S in one year 12. Neurogenic bladder Improved, now voiding with minimal  residuals 13. Hx of psoriasis -holding enbrel as there is concern that this might have been a contributing factor to her syndrome 14. Recent seizures- resolved -secondary to hyponatremia  15. Bowel incontinence/constipation  7/26- improved since off TFs- having gas- not painful- doesn't want gas-x- con't regimen  Continue to monitor 16.  Leukocytosis  WBCs 10.7 on 7/19, labs ordered for tomorrow  7/26- labs not back yet  7/27- WBC down to 6.8 17.  Acute blood loss anemia  Hemoglobin 11.1 on 7/19, labs ordered for tomorrow  7/26- labs not back yet  7/27- Hb 11.1- doing better 18. Hypokalemia  7/27- K+ 3.4- will replete with KCL 40 mEqx1 and recheck in AM  7/28-  K+ 3.5- give another 2 doses KCl 40 mEq x2 to bring up to 4.0.  19. OSA  7/27- wore CPAP at home- will order auto-titration device until family brings hers in.   7/28- will bring in today   LOS: 10 days A FACE TO FACE EVALUATION WAS PERFORMED  Rinaldo Macqueen 04/28/2020, 7:56 AM

## 2020-04-28 NOTE — Progress Notes (Signed)
Physical Therapy Session Note  Patient Details  Name: Alison Huffman MRN: [id number] Date of Birth: 1941/05/03  Today's Date: 04/28/2020 PT Individual Time: 5456-2563 PT Individual Time Calculation (min): 58 min   Short Term Goals: Week 2:  PT Short Term Goal 1 (Week 2): Pt will ambulate x 15 ft with RW and mod A PT Short Term Goal 2 (Week 2): Pt will perform least restrictive transfers with min A consistently PT Short Term Goal 3 (Week 2): Pt will propel w/c x 100 ft at Supervision level  Skilled Therapeutic Interventions/Progress Updates:   Pt received supine in bed reporting she just woke up ~81minutes prior from a nap and agreeable to therapy session. Supine>sitting R EOB supervision using bedrails for trunk upright. Sit<>stand using RW with intermittent min assist for lifting when fatigued and CGA for steadying on descent. Stand pivot to w/c using RW with CGA/min assist for steadying.  Transported to/from gym in w/c for time management and energy conservation. Gait training ~70ft using RW with min assist for balance/safety and +2 providing w/c follow to allow further distance - demos slow gait speed with progressing towards more reciprocal pattern. Repeated step-ups to 1st 6" height step using B HRs with min/mod assist for lifting and lowering and cuing for increased hip/knee extensor activation x2 reps each LE. Lateral side stepping in // bars down back x2 with cuing for increased step length - CGA for steadying. Alternate B LE foot taps on 6" step using B UE support on // bars with CGA for steadying - demos quick fatigue with this task and increased difficulty lifting foot up onto step. Sit<>supine on flat mat table with supervision and increased time. Supine bridging with level 1 theraband around knees to promote increased hip abductor activation 2x8 reps - only moderate hip clearance noted and pt quickly fatigues with this exercise demoing impaired glute strength. Standing balance/tolerance  task using RW via placing clothespins on basketball goal with CGA for steadying and pt able to progress to short bouts of no UE support on RW. Stand pivot to w/c using RW CGA. Transported back to room. Stand pivot w/c>recliner using RW with CGA for steadying. Pt left seated in recliner with needs in reach, seat belt alarm on, and B LEs elevated.  Therapy Documentation Precautions:  Precautions Precautions: Fall Precaution Comments: gross limb ataxia, hx of pacemaker, on supplemental oxygen Restrictions Weight Bearing Restrictions: No  Pain:   No reports of pain throughout session.    Therapy/Group: Individual Therapy  Tawana Scale , PT, DPT, CSRS  04/28/2020, 3:22 PM

## 2020-04-28 NOTE — Progress Notes (Signed)
Modified Barium Swallow Progress Note  Patient Details  Name: Alison Huffman MRN: [id number] Date of Birth: [birth year]  Today's Date: 04/28/2020  Modified Barium Swallow completed.  Full report located under Chart Review in the Imaging Section.  Brief recommendations include the following:  Clinical Impression  Pt demonstrated improvements in pharyngeal swallow function today in comparison to last MBSS conducted on 04/21/20. Although flash penetration of thin barium was observed on first trial of thin via cup, it remained above the level of the vocal folds and was ejected from the vestibule during the swallow (PAS scale 2). Pt also demonstrated decreased bolus cohesion, which resulted in piecemeal swallowing of thin and regular texture boluses, however extra swallows were beneficial to clear mild vallecular residue of thin and were mostly sensed by pt without need for cues from clinician. Use of the supraglottic swallow strategy did not appear to influence airway protection, therefore it is no longer necessary for her to perform during PO intake. Flash penetration also observed X1 when pt consumed barium pill with thin, however no aspiration observed and all penetrate ejected during the swallow. Recommend pt upgrade to regular solid texture diet with thin liquids using slow rate, small sips/bites, and using extra dry swallows with liquids. Medications may be administered whole with thins or whole in applesauce, depending on pt preference. Provide set up and intermittent supervision during meals. ST will continue to provide skilled interventions for dysphagia while inpatient in order to ensure diet safety and efficiency.    Swallow Evaluation Recommendations       SLP Diet Recommendations: Thin liquid;Regular solids   Liquid Administration via: Cup;Straw   Medication Administration: Whole meds with liquid   Supervision: Patient able to self feed;Intermittent supervision to cue for compensatory  strategies   Compensations: Slow rate;Small sips/bites;Multiple dry swallows after each bite/sip   Postural Changes: Remain semi-upright after after feeds/meals (Comment);Seated upright at 90 degrees   Oral Care Recommendations: Oral care BID        Arbutus Leas 04/28/2020,9:50 AM

## 2020-04-29 ENCOUNTER — Ambulatory Visit (HOSPITAL_COMMUNITY): Payer: Medicare Other | Admitting: *Deleted

## 2020-04-29 ENCOUNTER — Inpatient Hospital Stay (HOSPITAL_COMMUNITY): Payer: Medicare Other

## 2020-04-29 LAB — GLUCOSE, CAPILLARY
Glucose-Capillary: 90 mg/dL (ref 70–99)
Glucose-Capillary: 85 mg/dL (ref 70–99)

## 2020-04-29 MED ORDER — INSULIN ASPART 100 UNIT/ML ~~LOC~~ SOLN: 0.0000 [IU] | Freq: Two times a day (BID) | SUBCUTANEOUS | Status: DC

## 2020-04-29 NOTE — Progress Notes (Signed)
[place] PHYSICAL MEDICINE & REHABILITATION PROGRESS NOTE   Subjective/Complaints:   Pt still reports having nerve pain, but still doesn't want meds for it- rates it 2-3/10- slept well.  LBM yesterday- slept with her CPAP from home.   Passed MBSS- regular diet with thin liquids- so should be able to improve hydration.    ROS:   Pt denies SOB, abd pain, CP, N/V/C/D, and vision changes  Objective:   DG Swallowing Func-Speech Pathology  Result Date: 04/28/2020 Objective Swallowing Evaluation: Type of Study: MBS-Modified Barium Swallow Study  Patient Details Name: Alison Huffman MRN: [id number] Date of Birth: [birth year] Today's Date: 04/28/2020 Past Medical History: Past Medical History: Diagnosis Date . Ankle pain, right  . Atypical chest pain   a. 10/2018 MV: EF 57%. No ischemia/infarct. . Coronary artery disease  . Diastolic dysfunction   a. 04/2018 Echo: EF 55-60%, no rwma, Gr1 DD. Triv AI, mild MR. Mod dil LA. Nl RV fxn. PASP nl. . GERD (gastroesophageal reflux disease)  . Headache   "dull one sometimes daily, at least weekly in last couple months" (07/22/2018) . Hyperlipidemia  . Hypertension  . 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 Joint pain in fingers of right hand  . Lichen sclerosus  . Osteopenia  . Pneumonia 2012? X 1 . 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 Psoriasis  . Psoriatic arthritis ([place])   " dx'd the 1st of this year, 2019" (07/22/2018) . Psoriatic arthritis ([place])  . Rosacea  . Seborrheic keratosis  . Second degree heart block   a. 07/2018 s/p SJM Assurity MRI model [id number] (Ser# [id number]). . Sleep apnea   "using nasal pilllows since ~ 12/2017" (07/22/2018) . TMJ (dislocation of temporomandibular joint)  Past Surgical History: Past Surgical History: Procedure Laterality Date . APPENDECTOMY   . BREAST CYST ASPIRATION Bilateral  . BREAST CYST EXCISION Right 1978  benign . COLONOSCOPY   . COLONOSCOPY WITH PROPOFOL N/A 07/10/2019  Procedure: COLONOSCOPY WITH PROPOFOL;  Surgeon:  Lollie Sails, MD;  Location: [facility] ENDOSCOPY;  Service: Endoscopy;  Laterality: N/A; . ESOPHAGOGASTRODUODENOSCOPY (EGD) WITH PROPOFOL N/A 07/10/2019  Procedure: ESOPHAGOGASTRODUODENOSCOPY (EGD) WITH PROPOFOL;  Surgeon: Lollie Sails, MD;  Location: [facility] ENDOSCOPY;  Service: Endoscopy;  Laterality: N/A; . HYSTEROSCOPY WITH D & C N/A 11/05/2018  Procedure: DILATATION AND CURETTAGE /HYSTEROSCOPY;  Surgeon: Homero Fellers, MD;  Location: [facility] ORS;  Service: Gynecology;  Laterality: N/A; . INSERT / REPLACE / REMOVE PACEMAKER   . PACEMAKER IMPLANT N/A 07/23/2018  SJM Assurity [id number] implanted by Dr Rayann Heman for mobitz II second degree AV block . UTERINE POLYPS REMOVAL   HPI: Pt is an 79 yo female with presumed [place]. She is a former smoker transferred to [facility] from [place] on 6/26 with weakness, numbness, and tingling after being started on Enbrel for psoriasis. Patient developed severe hyponatremia, had a seizure and was transferred to [facility] on 7/03 for further neurologic assessment.  Required intubation for airway protection on 7/3 and waws extubated on 7/11 but has needed biPAP intermittently for respiratory distress. Pt became unresponsive during MRI on 7/6 and had code blue; sustained bilateral pneumothoraces after bag ventilation for which bilateral chest tubes were placed. MRI was negative for acute changes. Patient started on PLEX plasmaphoresis 7/7. CXR 7/12: Bibasilar atelectasis/infiltrate with no significant interval change.Cortrak placed 7/9  No data recorded Assessment / Plan / Recommendation CHL IP CLINICAL IMPRESSIONS 04/28/2020 Clinical Impression Pt demonstrated improvements in pharyngeal swallow function today in comparison to  last MBSS conducted on 04/21/20. Although flash penetration of thin barium was observed on first trial of thin via cup, it remained above the level of the vocal folds and was ejected from the vestibule during the swallow (PAS  scale 2). Pt also demonstrated decreased bolus cohesion, which resulted in piecemeal swallowing of thin and regular texture boluses, however extra swallows were beneficial to clear mild vallecular residue of thin and were mostly sensed by pt without need for cues from clinician. Use of the supraglottic swallow strategy did not appear to influence airway protection, therefore it is no longer necessary for her to perform during PO intake. Flash penetration also observed X1 when pt consumed barium pill with thin, however no aspiration observed and all penetrate ejected during the swallow. Recommend pt upgrade to regular solid texture diet with thin liquids using slow rate, small sips/bites, and using extra dry swallows with liquids. Medications may be administered whole with thins or whole in applesauce, depending on pt preference. Provide set up and intermittent supervision during meals. ST will continue to provide skilled interventions for dysphagia while inpatient in order to ensure diet safety and efficiency. SLP Visit Diagnosis Dysphagia, oropharyngeal phase (R13.12) Attention and concentration deficit following -- Frontal lobe and executive function deficit following -- Impact on safety and function Mild aspiration risk   CHL IP TREATMENT RECOMMENDATION 04/15/2020 Treatment Recommendations Therapy as outlined in treatment plan below   Prognosis 04/15/2020 Prognosis for Safe Diet Advancement Good Barriers to Reach Goals -- Barriers/Prognosis Comment -- CHL IP DIET RECOMMENDATION 04/28/2020 SLP Diet Recommendations Thin liquid;Regular solids Liquid Administration via Cup;Straw Medication Administration Whole meds with liquid Compensations Slow rate;Small sips/bites;Multiple dry swallows after each bite/sip Postural Changes Remain semi-upright after after feeds/meals (Comment);Seated upright at 90 degrees   CHL IP OTHER RECOMMENDATIONS 04/28/2020 Recommended Consults -- Oral Care Recommendations Oral care BID Other  Recommendations --   CHL IP FOLLOW UP RECOMMENDATIONS 04/16/2020 Follow up Recommendations Inpatient Rehab   CHL IP FREQUENCY AND DURATION 04/15/2020 Speech Therapy Frequency (ACUTE ONLY) min 2x/week Treatment Duration 2 weeks      CHL IP ORAL PHASE 04/28/2020 Oral Phase Impaired Oral - Pudding Teaspoon -- Oral - Pudding Cup -- Oral - Honey Teaspoon -- Oral - Honey Cup -- Oral - Nectar Teaspoon -- Oral - Nectar Cup NT Oral - Nectar Straw -- Oral - Thin Teaspoon -- Oral - Thin Cup Decreased bolus cohesion;Piecemeal swallowing Oral - Thin Straw Piecemeal swallowing;Decreased bolus cohesion Oral - Puree NT Oral - Mech Soft NT Oral - Regular Lingual/palatal residue Oral - Multi-Consistency -- Oral - Pill WFL Oral Phase - Comment --  CHL IP PHARYNGEAL PHASE 04/28/2020 Pharyngeal Phase Impaired Pharyngeal- Pudding Teaspoon -- Pharyngeal -- Pharyngeal- Pudding Cup -- Pharyngeal -- Pharyngeal- Honey Teaspoon NT Pharyngeal -- Pharyngeal- Honey Cup -- Pharyngeal -- Pharyngeal- Nectar Teaspoon NT Pharyngeal -- Pharyngeal- Nectar Cup -- Pharyngeal -- Pharyngeal- Nectar Straw -- Pharyngeal -- Pharyngeal- Thin Teaspoon NT Pharyngeal -- Pharyngeal- Thin Cup Reduced epiglottic inversion;Reduced anterior laryngeal mobility;Reduced laryngeal elevation;Penetration/Aspiration during swallow;Pharyngeal residue - valleculae;Compensatory strategies attempted (with notebox) Pharyngeal Material enters airway, remains ABOVE vocal cords then ejected out Pharyngeal- Thin Straw Reduced epiglottic inversion;Reduced laryngeal elevation;Reduced anterior laryngeal mobility;Penetration/Aspiration during swallow;Pharyngeal residue - valleculae Pharyngeal Material enters airway, remains ABOVE vocal cords then ejected out Pharyngeal- Puree NT Pharyngeal -- Pharyngeal- Mechanical Soft NT Pharyngeal -- Pharyngeal- Regular WFL Pharyngeal -- Pharyngeal- Multi-consistency -- Pharyngeal -- Pharyngeal- Pill WFL Pharyngeal -- Pharyngeal Comment --  CHL IP CERVICAL  ESOPHAGEAL  PHASE 04/28/2020 Cervical Esophageal Phase WFL Pudding Teaspoon -- Pudding Cup -- Honey Teaspoon -- Honey Cup -- Nectar Teaspoon -- Nectar Cup -- Nectar Straw -- Thin Teaspoon -- Thin Cup -- Thin Straw -- Puree -- Mechanical Soft -- Regular -- Multi-consistency -- Pill -- Cervical Esophageal Comment -- Arbutus Leas 04/28/2020, 9:53 AM              No results for input(s): WBC, HGB, HCT, PLT in the last 72 hours. Recent Labs    04/28/20 0520  NA 141  K 3.5  CL 108  CO2 28  GLUCOSE 93  BUN 9  CREATININE 0.36*  CALCIUM 8.9    Intake/Output Summary (Last 24 hours) at 04/29/2020 1019 Last data filed at 04/28/2020 1820 Gross per 24 hour  Intake 400 ml  Output --  Net 400 ml     Physical Exam: Vital Signs Blood pressure (!) 133/58, pulse 76, temperature 98 F (36.7 C), resp. rate 16, height 5\' 6"  (1.676 m), weight 68.3 kg, SpO2 95 %. Constitutional: No distress . Vital signs reviewed.sitting up- much more interactive, appropriate, NAD HENT: Normocephalic.  Atraumatic. Eyes: conjugate gaze Cardiovascular: RRR Respiratory: CTA B/L- no W/R/R- good air movement GI: Soft, NT, ND, (+)BS  Skin: dry, intact Musc: No edema in extremities.  No tenderness in extremities. Neuro: more engaged.  Dysphonia, much improved in last 24 hours Motor: Bilateral upper extremities: 4--4/5 proximal distal Bilateral lower extremities: 3-3+/5 proximal to distal  Assessment/Plan: 1. 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  Physiatrist is providing close team supervision and 24 hour management of active medical problems listed below.  Physiatrist and rehab team continue to assess barriers to discharge/monitor patient progress toward functional and medical goals  Care Tool:  Bathing    Body parts bathed by patient: Right arm, Left arm, Chest, Abdomen, Front perineal area,  Right upper leg, Left upper leg, Face, Buttocks   Body parts bathed by helper: Right lower leg, Left lower leg     Bathing assist Assist Level: Minimal Assistance - Patient > 75%     Upper Body Dressing/Undressing Upper body dressing   What is the patient wearing?: Pull over shirt    Upper body assist Assist Level: Set up assist    Lower Body Dressing/Undressing Lower body dressing      What is the patient wearing?: Incontinence brief, Pants     Lower body assist Assist for lower body dressing: Moderate Assistance - Patient 50 - 74%     Toileting Toileting    Toileting assist Assist for toileting: Minimal Assistance - Patient > 75%     Transfers Chair/bed transfer  Transfers assist  Chair/bed transfer activity did not occur: Safety/medical concerns  Chair/bed transfer assist level: Minimal Assistance - Patient > 75% (stand pivot) Chair/bed transfer assistive device: Programmer, multimedia   Ambulation assist   Ambulation activity did not occur: Safety/medical concerns  Assist level: Minimal Assistance - Patient > 75% Assistive device: Walker-rolling Max distance: 17ft   Walk 10 feet activity   Assist  Walk 10 feet activity did not occur: Safety/medical concerns  Assist level: Minimal Assistance - Patient > 75% Assistive device: Walker-rolling   Walk 50 feet activity   Assist Walk 50 feet with 2 turns activity did not occur: Safety/medical concerns (havent performed with turns)         Walk 150 feet  activity   Assist Walk 150 feet activity did not occur: Safety/medical concerns         Walk 10 feet on uneven surface  activity   Assist Walk 10 feet on uneven surfaces activity did not occur: Safety/medical concerns         Wheelchair     Assist Will patient use wheelchair at discharge?: Yes Type of Wheelchair: Manual Wheelchair activity did not occur: Safety/medical concerns  Wheelchair assist level:  Supervision/Verbal cueing Max wheelchair distance: 50    Wheelchair 50 feet with 2 turns activity    Assist    Wheelchair 50 feet with 2 turns activity did not occur: Safety/medical concerns   Assist Level: Supervision/Verbal cueing   Wheelchair 150 feet activity     Assist  Wheelchair 150 feet activity did not occur: Safety/medical concerns       Blood pressure (!) 133/58, pulse 76, temperature 98 F (36.7 C), resp. rate 16, height 5\' 6"  (1.676 m), weight 68.3 kg, SpO2 95 %.  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 Antithrombotics: -DVT/anticoagulation:Pharmaceutical:Lovenox -antiplatelet therapy: n/a 3. Pain Management:tylenol prn  7/29- no change- pain 2-3/10- no meds per pt request 4. Mood:patient appears up beat and motivated -team to provide ego support as needed -antipsychotic agents: n/a 5. Neuropsych: This patientiscapable of making decisions on herown behalf. 6. Skin/Wound Care: -Stage II sacral wound 7. Fluids/Electrolytes/Nutrition:encourage PO 8. Oropharyngeal dysphagia:  D3 nectar thick liquids and no straws  7/27- BUN 13 and Cr 0.39- not dry currently since on nectar thick liquids  7/28- MBS today or tomorrow  7/29- passed MBSS- is regular diet and thin liquids as of now.  9. 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 Elevated LFT's -U/S 7/12 with sludge, borderline hepatic steatosis -f/u U/S in one year 12. Neurogenic bladder Improved, now voiding with minimal residuals 13. Hx of psoriasis -holding enbrel as there is concern that this might have been a contributing factor to her  syndrome 14. Recent seizures- resolved -secondary to hyponatremia  15. Bowel incontinence/constipation  7/26- improved since off TFs- having gas- not painful- doesn't want gas-x- con't regimen  Continue to monitor 16.  Leukocytosis  WBCs 10.7 on 7/19, labs ordered for tomorrow  7/26- labs not back yet  7/27- WBC down to 6.8 17.  Acute blood loss anemia  Hemoglobin 11.1 on 7/19, labs ordered for tomorrow  7/26- labs not back yet  7/27- Hb 11.1- doing better 18. Hypokalemia  7/27- K+ 3.4- will replete with KCL 40 mEqx1 and recheck in AM  7/28-  K+ 3.5- give another 2 doses KCl 40 mEq x2 to bring up to 4.0.  19. OSA  7/27- wore CPAP at home- will order auto-titration device until family brings hers in.   7/28- will bring in today  7/29- slept better   LOS: 11 days A FACE TO FACE EVALUATION WAS PERFORMED  Alison Huffman 04/29/2020, 10:19 AM

## 2020-04-29 NOTE — Progress Notes (Signed)
Occupational Therapy Session Note  Patient Details  Name: [name] MRN: [id number] Date of Birth: 04-02-1941  Today's Date: 04/29/2020 OT Individual Time: 1300-1410 OT Individual Time Calculation (min): 70 min    Short Term Goals: Week 2:  OT Short Term Goal 1 (Week 2): Pt will complete toilet transfer with max A +2 OT Short Term Goal 2 (Week 2): Pt will perform sit<>stand with max A to facilitate LB clothing management OT Short Term Goal 3 (Week 2): Pt will complete LB dressing tasks with mod A OT Short Term Goal 4 (Week 2): Pt will complete toileting tasks with mod A  Skilled Therapeutic Interventions/Progress Updates:    Pt resting in w/c with Recreational Therapist present. OT intervention with focus on standing balance, BLE/BUE strengthening and endurance, toilet tranfsers, activity tolerance, and safety awareness to increase independence with BADLs. Initial focus on standing balance on compliant and noncompliant surface while engaging in BUE activity to [employer].  4 trials of one minute each. 2 trials on floor and 2 trials on AirEx pad.  Pt required CGA when standing on AirEx pad and pt commented that she felt wobbly. Pt stated she needed to use toilet and returned to room to use BSC. Transfers to [facility] with CGA using RW. Pt completed toileting tasks with CGA. Pt amb with RW to sink and washed her hands while standing at sink.  Pt commented that she thinks she sat in chair too long earlier and her legs needed "loosening up." Pt transitioned to Day Room and engaged in NuStep activity-4 mins X 2 with rest break, level 2. Pt commented that her legs didn't feel "tight" anymore.  Pt returned to room and remained in w/c with all needs within reach.   Therapy Documentation Precautions:  Precautions Precautions: Fall Precaution Comments: gross limb ataxia, hx of pacemaker, on supplemental oxygen Restrictions Weight Bearing Restrictions: No  Pain:  Pt denies pain this  afternoon   Therapy/Group: Individual Therapy  Leroy Libman 04/29/2020, 2:15 PM

## 2020-04-29 NOTE — Progress Notes (Signed)
Occupational Therapy Session Note  Patient Details  Name: Alison Huffman MRN: [id number] Date of Birth: [birth year]  Today's Date: 04/29/2020 OT Individual Time: 0800-0900 OT Individual Time Calculation (min): 60 min    Short Term Goals: Week 2:  OT Short Term Goal 1 (Week 2): Pt will complete toilet transfer with max A +2 OT Short Term Goal 2 (Week 2): Pt will perform sit<>stand with max A to facilitate LB clothing management OT Short Term Goal 3 (Week 2): Pt will complete LB dressing tasks with mod A OT Short Term Goal 4 (Week 2): Pt will complete toileting tasks with mod A  Skilled Therapeutic Interventions/Progress Updates:    Pt resting in bed upon arrival.  Pt elected to bathe at EOB with sit<>stand for clothing management. Pt requires CGA for sit<>stand and standing balance.  Pt requires assistance with donning socks without AE. Pt requested to use BSC and completed transfer and toileting tasks with CGA. Pt amb with RW to sink and sat in w/c to complete grooming tasks. Pt amb with RW to recliner and remained in recliner with belt alarm activated and all needs within reach.   Therapy Documentation Precautions:  Precautions Precautions: Fall Precaution Comments: gross limb ataxia, hx of pacemaker, on supplemental oxygen Restrictions Weight Bearing Restrictions: No  Pain: Pt denies pain but continues to c/o discomfort in upper abdominal area; declined medications at this time  Therapy/Group: Individual Therapy  Leroy Libman 04/29/2020, 9:05 AM

## 2020-04-29 NOTE — Plan of Care (Signed)
  Problem: RH Balance Goal: LTG Patient will maintain dynamic standing balance (PT) Description: LTG:  Patient will maintain dynamic standing balance with assistance during mobility activities (PT) Flowsheets (Taken 04/29/2020 1503) LTG: Pt will maintain dynamic standing balance during mobility activities with:: (upgraded ABG) Contact Guard/Touching assist Note: Upgraded due to progress    Problem: Sit to Stand Goal: LTG:  Patient will perform sit to stand with assistance level (PT) Description: LTG:  Patient will perform sit to stand with assistance level (PT) Flowsheets (Taken 04/29/2020 1503) LTG: PT will perform sit to stand in preparation for functional mobility with assistance level: (upgraded) Contact Guard/Touching assist Note: Upgraded due to progress    Problem: RH Bed to Chair Transfers Goal: LTG Patient will perform bed/chair transfers w/assist (PT) Description: LTG: Patient will perform bed to chair transfers with assistance (PT). Flowsheets (Taken 04/29/2020 1503) LTG: Pt will perform Bed to Chair Transfers with assistance level: (upgraded) Contact Guard/Touching assist Note: Upgraded due to progress    Problem: RH Ambulation Goal: LTG Patient will ambulate in controlled environment (PT) Description: LTG: Patient will ambulate in a controlled environment, # of feet with assistance (PT). Flowsheets (Taken 04/29/2020 1503) LTG: Pt will ambulate in controlled environ  assist needed:: (upgraded) Contact Guard/Touching assist LTG: Ambulation distance in controlled environment: 12' Note: Upgraded due to progress

## 2020-04-29 NOTE — Progress Notes (Signed)
Pt placed on her home CPAP with nasal pillows.  H20 added to her machine up to fill line.  Patient tolerating well at this time.

## 2020-04-29 NOTE — Progress Notes (Signed)
Physical Therapy Session Note  Patient Details  Name: Alison Huffman MRN: [id number] Date of Birth: [birth year]  Today's Date: 04/29/2020 PT Individual Time: 1440-1455 PT Individual Time Calculation (min): 15 min  and Today's Date: 04/29/2020 PT Concurrent Time: 1415-1440 PT Concurrent Time Calculation (min): 25 min  Short Term Goals: Week 1:  PT Short Term Goal 1 (Week 1): Pt will complete supine<>sit with mod assist +1 & hospital bed features. PT Short Term Goal 1 - Progress (Week 1): Met PT Short Term Goal 2 (Week 1): Pt will complete bed<>w/c transfer with max assist +1 & LRAD. PT Short Term Goal 2 - Progress (Week 1): Met PT Short Term Goal 3 (Week 1): Pt will propel w/c 50 ft with supervision. PT Short Term Goal 3 - Progress (Week 1): Met Week 2:  PT Short Term Goal 1 (Week 2): Pt will ambulate x 15 ft with RW and mod A PT Short Term Goal 2 (Week 2): Pt will perform least restrictive transfers with min A consistently PT Short Term Goal 3 (Week 2): Pt will propel w/c x 100 ft at Supervision level  Skilled Therapeutic Interventions/Progress Updates:    Pt reporting being overall fatigued today but willing to attempt therapy session and limited and modified to her tolerance. Education on importance of self monitoring and energy conservation as well throughout.  Dynamic sitting balance and core strengthening activity with weighted bar (4#) while hitting a beach ball back and forth. BLE coordination and strengthening focusing on kicking ball to target.  Seated LE therex for functional strengthening to aid with overall mobility including LAQ (2# weight), marches (2# weight) and hip adduction squeezes with ball x 10 reps each x 2 sets.  Dynamic gait through obstacle course 25' x 2 with overall min assist for balance and turning with cues for safe positioning of RW and increasing foot clearance and activating hip and knee flexion.   CGA/min assist transfer back to bed with RW and short  distance gait. Returned to supine with supervision and all needs in reach.   Therapy Documentation Precautions:  Precautions Precautions: Fall Precaution Comments: gross limb ataxia, hx of pacemaker Restrictions Weight Bearing Restrictions: No  Pain:  No reports of pain, just fatigue. Feels like she may have done too much yesterday   Therapy/Group: Individual Therapy  Canary Brim Ivory Broad, PT, DPT, CBIS  04/29/2020, 3:08 PM

## 2020-04-30 ENCOUNTER — Inpatient Hospital Stay (HOSPITAL_COMMUNITY): Payer: Medicare Other

## 2020-04-30 ENCOUNTER — Inpatient Hospital Stay (HOSPITAL_COMMUNITY): Payer: Medicare Other | Admitting: Physical Therapy

## 2020-04-30 ENCOUNTER — Inpatient Hospital Stay (HOSPITAL_COMMUNITY): Payer: Medicare Other | Admitting: Speech Pathology

## 2020-04-30 LAB — GLUCOSE, CAPILLARY: Glucose-Capillary: 91 mg/dL (ref 70–99)

## 2020-04-30 MED ORDER — PANTOPRAZOLE SODIUM 40 MG PO TBEC
40.0000 mg | DELAYED_RELEASE_TABLET | Freq: Every day | ORAL | Status: DC
  Administered 2020-04-30 – 2020-05-05 (×6): 40 mg via ORAL
  Filled 2020-04-30 (×6): qty 1

## 2020-04-30 NOTE — Progress Notes (Signed)
RT placed patient on home unit CPAP HS. NO O2 bleed in needed. Patient tolerating well at this time.

## 2020-04-30 NOTE — Progress Notes (Signed)
Occupational Therapy Session Note  Patient Details  Name: Alison Huffman MRN: [id number] Date of Birth: 1941/04/15  Today's Date: 04/30/2020 OT Individual Time: 5789-7847 OT Individual Time Calculation (min): 70 min    Short Term Goals: Week 2:  OT Short Term Goal 1 (Week 2): Pt will complete toilet transfer with max A +2 OT Short Term Goal 2 (Week 2): Pt will perform sit<>stand with max A to facilitate LB clothing management OT Short Term Goal 3 (Week 2): Pt will complete LB dressing tasks with mod A OT Short Term Goal 4 (Week 2): Pt will complete toileting tasks with mod A  Skilled Therapeutic Interventions/Progress Updates:    OT intervention with focus on BADL retraining, functional amb with RW, standing balance, activity tolerance, and safety awareness to increase independence with BADLs. Supine>sit EOB with supervision.  Amb with RW to bathroom with CGA.  BSC transfers and toileting with CGA. Pt completed bathng at shower level with CGA. LB dressing with CGA. Pt amb with RW in room and completed grooming tasks standing at sink. Pt requires more then a reasonable amount of time to complete tasks. Pt commented that taking a shower is exhausting but felt good. Pt amb with RW to recliner and remained in recliner with all needs within reach and belt alarm activated.   Therapy Documentation Precautions:  Precautions Precautions: Fall Precaution Comments: gross limb ataxia, hx of pacemaker, on supplemental oxygen Restrictions Weight Bearing Restrictions: No  Pain:   Pt c/o ongoing "tightness" upper abdominal and around back; MD aware   Therapy/Group: Individual Therapy  Leroy Libman 04/30/2020, 10:19 AM

## 2020-04-30 NOTE — Progress Notes (Signed)
[facility] PHYSICAL MEDICINE & REHABILITATION PROGRESS NOTE   Subjective/Complaints:   Pt had a lot of questions this AM about whether or not when she stopped breathing, could it have been prevented- explained it's likely due to GBS getting to diaphragm- and I don't think earlier treatment could have prevented it- I see that treatment speeds recovery but doesn't stop it from getting worse in most cases.   Also says passing a lot of gas- doesn't know if it's stool or gas, so goes to bathroom every time.    ROS:  Pt denies SOB, abd pain, CP, N/V/C/D, and vision changes   Objective:   No results found. No results for input(s): WBC, HGB, HCT, PLT in the last 72 hours. Recent Labs    04/28/20 0520  NA 141  K 3.5  CL 108  CO2 28  GLUCOSE 93  BUN 9  CREATININE 0.36*  CALCIUM 8.9    Intake/Output Summary (Last 24 hours) at 04/30/2020 1043 Last data filed at 04/30/2020 0755 Gross per 24 hour  Intake 927 ml  Output --  Net 927 ml     Physical Exam: Vital Signs Blood pressure (!) 133/66, pulse 81, temperature 98.3 F (36.8 C), resp. rate 17, height 5\' 6"  (1.676 m), weight 68.3 kg, SpO2 96 %. Constitutional: No distress . Vital signs reviewed.sitting up- finished breakfast, NAD HENT: Normocephalic.  Atraumatic. Eyes: conjugate gaze Cardiovascular: RRR Respiratory: CTA B/L- no W/R/R- good air movement - prolonged expiration that's obvious, but great air movement B/L GI: Soft, NT, ND, (+)BS  Skin: dry, intact Musc: No edema in extremities.  No tenderness in extremities. Neuro: more engaged. - no change Dysphonia, much improved in last 24 hours Motor: Bilateral upper extremities: 4+ in UEs and 4 to 4+ in LEs- improved- checked biceps, triceps, grip and finger abd; also  KE, DF and PF  Assessment/Plan: 1. 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  Physiatrist is providing close team supervision and 24 hour management of active medical problems listed below.  Physiatrist and rehab team continue to assess barriers to discharge/monitor patient progress toward functional and medical goals  Care Tool:  Bathing    Body parts bathed by patient: Right arm, Left arm, Chest, Abdomen, Front perineal area, Right upper leg, Left upper leg, Face, Buttocks, Right lower leg, Left lower leg   Body parts bathed by helper: Right lower leg, Left lower leg     Bathing assist Assist Level: Contact Guard/Touching assist     Upper Body Dressing/Undressing Upper body dressing   What is the patient wearing?: Pull over shirt    Upper body assist Assist Level: Set up assist    Lower Body Dressing/Undressing Lower body dressing      What is the patient wearing?: Incontinence brief, Pants     Lower body assist Assist for lower body dressing: Minimal Assistance - Patient > 75%     Toileting Toileting    Toileting assist Assist for toileting: Contact Guard/Touching assist     Transfers Chair/bed transfer  Transfers assist  Chair/bed transfer activity did not occur: Safety/medical concerns  Chair/bed transfer assist level: Contact Guard/Touching assist Chair/bed transfer assistive device: Programmer, multimedia   Ambulation assist   Ambulation activity did not occur: Safety/medical concerns  Assist level: Minimal Assistance - Patient > 75% Assistive device: Walker-rolling Max distance: 50'   Walk 10 feet activity  Assist  Walk 10 feet activity did not occur: Safety/medical concerns  Assist level: Minimal Assistance - Patient > 75% Assistive device: Walker-rolling   Walk 50 feet activity   Assist Walk 50 feet with 2 turns activity did not occur: Safety/medical concerns (havent performed with turns)  Assist level: Minimal Assistance - Patient > 75% Assistive device: Walker-rolling    Walk 150 feet  activity   Assist Walk 150 feet activity did not occur: Safety/medical concerns         Walk 10 feet on uneven surface  activity   Assist Walk 10 feet on uneven surfaces activity did not occur: Safety/medical concerns         Wheelchair     Assist Will patient use wheelchair at discharge?: Yes Type of Wheelchair: Manual Wheelchair activity did not occur: Safety/medical concerns  Wheelchair assist level: Supervision/Verbal cueing Max wheelchair distance: 50    Wheelchair 50 feet with 2 turns activity    Assist    Wheelchair 50 feet with 2 turns activity did not occur: Safety/medical concerns   Assist Level: Supervision/Verbal cueing   Wheelchair 150 feet activity     Assist  Wheelchair 150 feet activity did not occur: Safety/medical concerns       Blood pressure (!) 133/66, pulse 81, temperature 98.3 F (36.8 C), resp. rate 17, height 5\' 6"  (1.676 m), weight 68.3 kg, SpO2 96 %.  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 Antithrombotics: -DVT/anticoagulation:Pharmaceutical:Lovenox -antiplatelet therapy: n/a 3. Pain Management:tylenol prn  7/29- no change- pain 2-3/10- no meds per pt request  7/30- discussed nerve pain again- says it's intermittent and doesn't want meds- con't tylenol- did ask how long it last sand if will go away- explained 90% it does, but can take 1 year. 4. Mood:patient appears up beat and motivated -team to provide ego support as needed -antipsychotic agents: n/a 5. Neuropsych: This patientiscapable of making decisions on herown behalf. 6. Skin/Wound Care: -Stage II sacral wound 7. Fluids/Electrolytes/Nutrition:encourage PO 8. Oropharyngeal dysphagia:  D3 nectar thick liquids and no straws  7/27- BUN 13 and Cr 0.39- not dry currently since on nectar thick liquids  7/28- MBS today or  tomorrow  7/29- passed MBSS- is regular diet and thin liquids as of now.  9. 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 Elevated LFT's -U/S 7/12 with sludge, borderline hepatic steatosis -f/u U/S in one year 12. Neurogenic bladder Improved, now voiding with minimal residuals 13. Hx of psoriasis -holding enbrel as there is concern that this might have been a contributing factor to her syndrome 14. Recent seizures- resolved -secondary to hyponatremia  15. Bowel incontinence/constipation  7/26- improved since off TFs- having gas- not painful- doesn't want gas-x- con't regimen  7/30- passing a lot of gas- can't tell if stool or gas- said this can occur  Continue to monitor 16.  Leukocytosis  WBCs 10.7 on 7/19, labs ordered for tomorrow  7/26- labs not back yet  7/27- WBC down to 6.8 17.  Acute blood loss anemia  Hemoglobin 11.1 on 7/19, labs ordered for tomorrow  7/26- labs not back yet  7/27- Hb 11.1- doing better 18. Hypokalemia  7/27- K+ 3.4- will replete with KCL 40 mEqx1 and recheck in AM  7/28-  K+ 3.5- give another 2 doses KCl 40 mEq x2 to bring up to  4.0.  7/30- will recheck labs this weekend  19. OSA  7/27- wore CPAP at home- will order auto-titration device until family brings hers in.   7/28- will bring in today  7/29- slept better   LOS: 12 days A FACE TO [place] 04/30/2020, 10:43 AM

## 2020-04-30 NOTE — Progress Notes (Signed)
Physical Therapy Session Note  Patient Details  Name: Alison Huffman MRN: [id number] Date of Birth: May 20, 1941  Today's Date: 04/30/2020 PT Individual Time: 4742-5956 PT Individual Time Calculation (min): 62 min   Short Term Goals: Week 2:  PT Short Term Goal 1 (Week 2): Pt will ambulate x 15 ft with RW and mod A PT Short Term Goal 2 (Week 2): Pt will perform least restrictive transfers with min A consistently PT Short Term Goal 3 (Week 2): Pt will propel w/c x 100 ft at Supervision level  Skilled Therapeutic Interventions/Progress Updates:    Pt received supine in bed awakening from a nap and agreeable to therapy session. Pt reports she is not as tired as she was yesterday. Supine>sitting R EOB supervision using bedrails for support as needed. Donned socks set-up assist and shoes max assist for time management. Sit>stand EOB>RW with min assist for lifting into standing - (otherwise able to perform with CGA remainder of session). Stand pivot to w/c using RW with CGA for steadying.  Transported to/from gym in w/c for time management and energy conservation. Gait training ~30ft x2 (seated break between) using RW  with CGA for steadying - cuing for increased step length, improved foot clearance, and improved heel strike on initial contact. Repeated step-ups on/off 1st step (6" height) 2sets 3reps per LE using B UE support on HRs with min assist and pt demoing improved LE hip/knee extensor muscle activation with less compensation via B UE support compared to last time performing this exercise with this therapist. Stand pivot w/c<>EOM using RW with CGA for steadying. Repeated sit<>stands to/from elevated EOM with B HHA to decrease UE compensation 2sets of 3 reps - cuing for anterior trunk lean followed by LE hip/knee extension. Seated B LE ankle DF/PF against level 1 theraband resistance x15 reps each LE - cuing for proper form/technique. Transported back to room in w/c. Stand pivot w/c>recliner using RW  with CGA for steadying. Left seated in recliner with B LEs elevated, needs in reach, and seat belt alarm on.   Therapy Documentation Precautions:  Precautions Precautions: Fall Precaution Comments: gross limb ataxia, hx of pacemaker, on supplemental oxygen Restrictions Weight Bearing Restrictions: No  Pain:  Denies pain during session.   Therapy/Group: Individual Therapy  Tawana Scale , PT, DPT, CSRS  04/30/2020, 12:17 PM

## 2020-04-30 NOTE — Progress Notes (Signed)
Speech Language Pathology Daily Session Note  Patient Details  Name: Alison Huffman MRN: [id number] Date of Birth: 03-May-1941  Today's Date: 04/30/2020 SLP Individual Time: 1105-1200 SLP Individual Time Calculation (min): 55 min  Short Term Goals: Week 2: SLP Short Term Goal 1 (Week 2): STG=LTG due to remaining length of stay  Skilled Therapeutic Interventions:   Patient seen to address speech-language goals with focus on completing sections of ALFA cognitive testing, RMT and speech and voice exercises. Patient tolerated increase IMST to 11 and EMST to 13 and completed 10 reps each. SLP directed patient in vocal exercises: yawn/sigh, sustained phonation. She was able to sustain vowel sound "ah" for 2.5 seconds. SLP discussed with patient regarding vocal cord adduction, vocal hygiene with recommendation for periods of vocal rest and to limit vocally abusive behaviors such as coughing, talking loudly, etc. Patient completed ALFA subtests: Subtest 4 Solving Daily Math problems:100% accuracy Subtest 8: Reading instructions:  100% accuracy  Pain Pain Assessment Pain Scale: 0-10 Pain Score: 0-No pain  Therapy/Group: Individual Therapy  Sonia Baller, MA, CCC-SLP Speech Therapy

## 2020-05-01 LAB — COMPREHENSIVE METABOLIC PANEL
ALT: 29 U/L (ref 0–44)
AST: 24 U/L (ref 15–41)
Albumin: 2.9 g/dL — ABNORMAL LOW (ref 3.5–5.0)
Alkaline Phosphatase: 83 U/L (ref 38–126)
Anion gap: 6 (ref 5–15)
BUN: 11 mg/dL (ref 8–23)
CO2: 27 mmol/L (ref 22–32)
Calcium: 9 mg/dL (ref 8.9–10.3)
Chloride: 105 mmol/L (ref 98–111)
Creatinine, Ser: 0.46 mg/dL (ref 0.44–1.00)
GFR calc Af Amer: >60 mL/min (ref >60)
GFR calc non Af Amer: >60 mL/min (ref >60)
Glucose, Bld: 99 mg/dL (ref 70–99)
Potassium: 4.1 mmol/L (ref 3.5–5.1)
Sodium: 138 mmol/L (ref 135–145)
Total Bilirubin: 0.4 mg/dL (ref 0.3–1.2)
Total Protein: 5.9 g/dL — ABNORMAL LOW (ref 6.5–8.1)

## 2020-05-01 LAB — GLUCOSE, CAPILLARY
Glucose-Capillary: 88 mg/dL (ref 70–99)
Glucose-Capillary: 94 mg/dL (ref 70–99)

## 2020-05-01 LAB — MAGNESIUM: Magnesium: 2.1 mg/dL (ref 1.7–2.4)

## 2020-05-01 MED ORDER — DICLOFENAC SODIUM 25 MG PO TBEC
50.0000 mg | DELAYED_RELEASE_TABLET | Freq: Two times a day (BID) | ORAL | Status: DC
  Administered 2020-05-02 – 2020-05-04 (×5): 50 mg via ORAL
  Filled 2020-05-01: qty 2
  Filled 2020-05-01: qty 1
  Filled 2020-05-01 (×4): qty 2
  Filled 2020-05-01: qty 1
  Filled 2020-05-01: qty 2
  Filled 2020-05-01 (×2): qty 1
  Filled 2020-05-01 (×2): qty 2

## 2020-05-01 NOTE — Plan of Care (Signed)
  Problem: Consults Goal: RH GENERAL PATIENT EDUCATION Description: See Patient Education module for education specifics. Outcome: Progressing Goal: Skin Care Protocol Initiated - if Braden Score 18 or less Description: If consults are not indicated, leave blank or document N/A Outcome: Progressing Goal: Nutrition Consult-if indicated Outcome: Progressing Goal: Diabetes Guidelines if Diabetic/Glucose > 140 Description: If diabetic or lab glucose is > 140 mg/dl - Initiate Diabetes/Hyperglycemia Guidelines & Document Interventions  Outcome: Progressing   Problem: RH BOWEL ELIMINATION Goal: RH STG MANAGE BOWEL WITH ASSISTANCE Description: STG Manage Bowel with mod Assistance. Outcome: Progressing Goal: RH STG MANAGE BOWEL W/MEDICATION W/ASSISTANCE Description: STG Manage Bowel with Medication with mod Assistance. Outcome: Progressing   Problem: RH BLADDER ELIMINATION Goal: RH STG MANAGE BLADDER WITH ASSISTANCE Description: STG Manage Bladder With mod Assistance Outcome: Progressing Goal: RH STG MANAGE BLADDER WITH MEDICATION WITH ASSISTANCE Description: STG Manage Bladder With Medication With mod Assistance. Outcome: Progressing Goal: RH STG MANAGE BLADDER WITH EQUIPMENT WITH ASSISTANCE Description: STG Manage Bladder With Equipment With mod  Assistance Outcome: Progressing   Problem: RH SKIN INTEGRITY Goal: RH STG SKIN FREE OF INFECTION/BREAKDOWN Description: Skin free from infection entire stay on rehab with mod assist Outcome: Progressing   Problem: RH SAFETY Goal: RH STG ADHERE TO SAFETY PRECAUTIONS W/ASSISTANCE/DEVICE Description: STG Adhere to Safety Precautions With moderate  Assistance/Device. Outcome: Progressing   Problem: RH PAIN MANAGEMENT Goal: RH STG PAIN MANAGED AT OR BELOW PT'S PAIN GOAL Description: Pain less than 2 Outcome: Progressing   Problem: RH KNOWLEDGE DEFICIT GENERAL Goal: RH STG INCREASE KNOWLEDGE OF SELF CARE AFTER HOSPITALIZATION Description:  Moderate assistance re: self care after hospitalization Outcome: Progressing

## 2020-05-01 NOTE — Progress Notes (Signed)
[facility] PHYSICAL MEDICINE & REHABILITATION PROGRESS NOTE   Subjective/Complaints:   Pt reports L shoulder- anteriorly is bothering her a lot- has had in past- took Aleve for it.   When lifts arm, it hurts.   Willing to try NSAIDs- no allergies  ROS:   Pt denies SOB, abd pain, CP, N/V/C/D, and vision changes   Objective:   No results found. No results for input(s): WBC, HGB, HCT, PLT in the last 72 hours. Recent Labs    05/01/20 0447  NA 138  K 4.1  CL 105  CO2 27  GLUCOSE 99  BUN 11  CREATININE 0.46  CALCIUM 9.0    Intake/Output Summary (Last 24 hours) at 05/01/2020 1326 Last data filed at 05/01/2020 0839 Gross per 24 hour  Intake 417 ml  Output 1 ml  Net 416 ml     Physical Exam: Vital Signs Blood pressure (!) 118/50, pulse 99, temperature 97.7 F (36.5 C), temperature source Oral, resp. rate 16, height 5\' 6"  (1.676 m), weight 68.3 kg, SpO2 99 %. Constitutional: No distress . Vital signs reviewed.sitting up in bed- appropriate, NAD HENT: Normocephalic.  Atraumatic. Eyes: conjugate gaze Cardiovascular: RRR Respiratory: CTA B/L- no W/R/R- good air movement GI: Soft, NT, ND, (+)BS  Skin: dry, intact Musc: No edema in extremities.  L anterior biceps tendon origin sore/TTP- more with lifting arm/biceps activation Neuro: more engaged. - no change Dysphonia, much improved in last 24 hours Motor: Bilateral upper extremities: 4+ in UEs and 4 to 4+ in LEs- improved- checked biceps, triceps, grip and finger abd; also  KE, DF and PF  Assessment/Plan: 1. 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  Physiatrist is providing close team supervision and 24 hour management of active medical problems listed below.  Physiatrist and rehab team continue to assess barriers to discharge/monitor patient progress toward functional and medical goals  Care  Tool:  Bathing    Body parts bathed by patient: Right arm, Left arm, Chest, Abdomen, Front perineal area, Right upper leg, Left upper leg, Face, Buttocks, Right lower leg, Left lower leg   Body parts bathed by helper: Right lower leg, Left lower leg     Bathing assist Assist Level: Contact Guard/Touching assist     Upper Body Dressing/Undressing Upper body dressing   What is the patient wearing?: Pull over shirt    Upper body assist Assist Level: Set up assist    Lower Body Dressing/Undressing Lower body dressing      What is the patient wearing?: Incontinence brief, Pants     Lower body assist Assist for lower body dressing: Minimal Assistance - Patient > 75%     Toileting Toileting    Toileting assist Assist for toileting: Contact Guard/Touching assist     Transfers Chair/bed transfer  Transfers assist  Chair/bed transfer activity did not occur: Safety/medical concerns  Chair/bed transfer assist level: Contact Guard/Touching assist Chair/bed transfer assistive device: Programmer, multimedia   Ambulation assist   Ambulation activity did not occur: Safety/medical concerns  Assist level: Contact Guard/Touching assist Assistive device: Walker-rolling Max distance: 60ft   Walk 10 feet activity   Assist  Walk 10 feet activity did not occur: Safety/medical concerns  Assist level: Contact Guard/Touching assist Assistive device: Walker-rolling   Walk 50 feet activity   Assist Walk 50 feet with 2 turns activity did not occur: Safety/medical concerns (did not perform turns)  Assist level: Minimal Assistance - Patient > 75% Assistive device: Walker-rolling    Walk 150 feet activity   Assist Walk 150 feet activity did not occur: Safety/medical concerns         Walk 10 feet on uneven surface  activity   Assist Walk 10 feet on uneven surfaces activity did not occur: Safety/medical concerns         Wheelchair     Assist Will  patient use wheelchair at discharge?: Yes Type of Wheelchair: Manual Wheelchair activity did not occur: Safety/medical concerns  Wheelchair assist level: Supervision/Verbal cueing Max wheelchair distance: 50    Wheelchair 50 feet with 2 turns activity    Assist    Wheelchair 50 feet with 2 turns activity did not occur: Safety/medical concerns   Assist Level: Supervision/Verbal cueing   Wheelchair 150 feet activity     Assist  Wheelchair 150 feet activity did not occur: Safety/medical concerns       Blood pressure (!) 118/50, pulse 99, temperature 97.7 F (36.5 C), temperature source Oral, resp. rate 16, height 5\' 6"  (1.676 m), weight 68.3 kg, SpO2 99 %.  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 Antithrombotics: -DVT/anticoagulation:Pharmaceutical:Lovenox -antiplatelet therapy: n/a 3. Pain Management:tylenol prn  7/29- no change- pain 2-3/10- no meds per pt request  7/30- discussed nerve pain again- says it's intermittent and doesn't want meds- con't tylenol- did ask how long it last sand if will go away- explained 90% it does, but can take 1 year.  7/31- Will try Diclofenac 50 mg BID x 5 days/10 doses for L bicipital tendinitis 4. Mood:patient appears up beat and motivated -team to provide ego support as needed -antipsychotic agents: n/a 5. Neuropsych: This patientiscapable of making decisions on herown behalf. 6. Skin/Wound Care: -Stage II sacral wound 7. Fluids/Electrolytes/Nutrition:encourage PO 8. Oropharyngeal dysphagia:  D3 nectar thick liquids and no straws  7/27- BUN 13 and Cr 0.39- not dry currently since on nectar thick liquids  7/28- MBS today or tomorrow  7/29- passed MBSS- is regular diet and thin liquids as of now.  9. 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 Elevated LFT's -U/S 7/12 with sludge, borderline hepatic steatosis -f/u U/S in one year 12. Neurogenic bladder Improved, now voiding with minimal residuals 13. Hx of psoriasis -holding enbrel as there is concern that this might have been a contributing factor to her syndrome 14. Recent seizures- resolved -secondary to hyponatremia  15. Bowel incontinence/constipation  7/26- improved since off TFs- having gas- not painful- doesn't want gas-x- con't regimen  7/30- passing a lot of gas- can't tell if stool or gas- said this can occur  Continue to monitor 16.  Leukocytosis  WBCs 10.7 on 7/19, labs ordered for tomorrow  7/26- labs not back yet  7/27- WBC down to 6.8 17.  Acute blood loss anemia  Hemoglobin 11.1 on 7/19, labs ordered for tomorrow  7/26- labs not back yet  7/27- Hb 11.1- doing better 18. Hypokalemia  7/27- K+ 3.4- will replete with KCL 40 mEqx1 and recheck in AM  7/28-  K+ 3.5- give another 2 doses KCl 40 mEq x2 to bring up to 4.0.  7/30- will recheck labs this weekend   7/31- K+ 4.1- no additional repltion needed right now 19. OSA  7/27- wore CPAP at home- will order auto-titration  device until family brings hers in.   7/28- will bring in today  7/29- slept better 20. L bicipital tendinitis  7/31- will try Diclofenac 50 mg BID x 10 doses/5 days and see if it helps.    LOS: 13 days A FACE TO FACE EVALUATION WAS PERFORMED  Signa Cheek 05/01/2020, 1:26 PM

## 2020-05-02 ENCOUNTER — Encounter (HOSPITAL_COMMUNITY): Payer: Self-pay | Admitting: Physical Medicine and Rehabilitation

## 2020-05-02 ENCOUNTER — Encounter (HOSPITAL_COMMUNITY): Payer: Medicare Other | Admitting: Occupational Therapy

## 2020-05-02 LAB — GLUCOSE, CAPILLARY
Glucose-Capillary: 90 mg/dL (ref 70–99)
Glucose-Capillary: 109 mg/dL — ABNORMAL HIGH (ref 70–99)

## 2020-05-02 NOTE — Progress Notes (Signed)
Occupational Therapy Session Note  Patient Details  Name: Alison Huffman MRN: [id number] Date of Birth: [birth year]  Today's Date: 05/02/2020 OT Group Time: 1100-1200 OT Group Time Calculation (min): 60 min  Skilled Therapeutic Interventions/Progress Updates:    Pt engaged in therapeutic w/c level dance group focusing on patient choice, UE/LE strengthening, salience, activity tolerance, and social participation. Pt was guided through various dance-based exercises involving UEs/LEs and trunk. All music was selected by group members. Emphasis placed on coordination of upper and lower limbs, activity tolerance, and standing balance. Pt exhibited high levels of participation in group, socially engaged with others, and stood with OT during 1 song with B UE support on the RW and CGA. At end of session pt was escorted back to room via w/c and was left with all needs within reach and safety belt fastened.    Therapy Documentation Precautions:  Precautions Precautions: Fall Precaution Comments: gross limb ataxia, hx of pacemaker, on supplemental oxygen Restrictions Weight Bearing Restrictions: No Pain: no s/s pain during tx    ADL: ADL Eating: NPO Grooming: Moderate assistance Upper Body Bathing: Moderate assistance Lower Body Bathing: Dependent Where Assessed-Lower Body Bathing: Bed level Where Assessed-Upper Body Dressing: Edge of bed Lower Body Dressing: Maximal assistance Toileting: Unable to assess Toilet Transfer Method: Unable to assess Gaffer Transfer: Unable to assess      Therapy/Group: Group Therapy  Teasha Murrillo A Esau Fridman 05/02/2020, 12:28 PM

## 2020-05-02 NOTE — Progress Notes (Signed)
[facility] PHYSICAL MEDICINE & REHABILITATION PROGRESS NOTE   Subjective/Complaints:   Pt reports L>R shoulder anteriorly are hurting now- heat helped the L shoulder last night somewhat, but hopeful that voltaren helps it more. Also sore on R shoulder this AM for some reason.   Also notes, has never been a smoker and it's in chart that was chronic lifelong smoker.  Will see if that's something that can be changed.  Ill make sure from now on, at least.   ROS:   Pt denies SOB, abd pain, CP, N/V/C/D, and vision changes   Objective:   No results found. No results for input(s): WBC, HGB, HCT, PLT in the last 72 hours. Recent Labs    05/01/20 0447  NA 138  K 4.1  CL 105  CO2 27  GLUCOSE 99  BUN 11  CREATININE 0.46  CALCIUM 9.0    Intake/Output Summary (Last 24 hours) at 05/02/2020 1142 Last data filed at 05/02/2020 0754 Gross per 24 hour  Intake 840 ml  Output --  Net 840 ml     Physical Exam: Vital Signs Blood pressure (!) 130/58, pulse 79, temperature 98.3 F (36.8 C), resp. rate 17, height 5\' 6"  (1.676 m), weight 68.3 kg, SpO2 97 %. Constitutional: No distress .sitting up in bed- rubbing anterior shoulders, NAD HENT: Normocephalic.  Atraumatic. Eyes: conjugate gaze Cardiovascular: RRR Respiratory: CTA B/L- no W/R/R- good air movement GI: Soft, NT, ND, (+)BS   Skin: dry, intact Musc: No edema in extremities.  L anterior biceps tendon origin sore/TTP- more with lifting arm/biceps activation- still there- slightly notable on R anterior shoulder as well.  Neuro: more engaged. - no change Dysphonia, much improved in last 24 hours Motor: Bilateral upper extremities: 4+ in UEs and 4 to 4+ in LEs- improved- checked biceps, triceps, grip and finger abd; also  KE, DF and PF  Assessment/Plan: 1. 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  Physiatrist is  providing close team supervision and 24 hour management of active medical problems listed below.  Physiatrist and rehab team continue to assess barriers to discharge/monitor patient progress toward functional and medical goals  Care Tool:  Bathing    Body parts bathed by patient: Right arm, Left arm, Chest, Abdomen, Front perineal area, Right upper leg, Left upper leg, Face, Buttocks, Right lower leg, Left lower leg   Body parts bathed by helper: Right lower leg, Left lower leg     Bathing assist Assist Level: Contact Guard/Touching assist     Upper Body Dressing/Undressing Upper body dressing   What is the patient wearing?: Pull over shirt    Upper body assist Assist Level: Set up assist    Lower Body Dressing/Undressing Lower body dressing      What is the patient wearing?: Incontinence brief, Pants     Lower body assist Assist for lower body dressing: Minimal Assistance - Patient > 75%     Toileting Toileting    Toileting assist Assist for toileting: Contact Guard/Touching assist     Transfers Chair/bed transfer  Transfers assist  Chair/bed transfer activity did not occur: Safety/medical concerns  Chair/bed transfer assist level: Contact Guard/Touching assist Chair/bed transfer assistive device: Programmer, multimedia   Ambulation assist   Ambulation activity did not occur: Safety/medical concerns  Assist level: Contact Guard/Touching assist Assistive device: Walker-rolling Max distance: 15ft   Walk 10 feet activity  Assist  Walk 10 feet activity did not occur: Safety/medical concerns  Assist level: Contact Guard/Touching assist Assistive device: Walker-rolling   Walk 50 feet activity   Assist Walk 50 feet with 2 turns activity did not occur: Safety/medical concerns (did not perform turns)  Assist level: Minimal Assistance - Patient > 75% Assistive device: Walker-rolling    Walk 150 feet activity   Assist Walk 150 feet  activity did not occur: Safety/medical concerns         Walk 10 feet on uneven surface  activity   Assist Walk 10 feet on uneven surfaces activity did not occur: Safety/medical concerns         Wheelchair     Assist Will patient use wheelchair at discharge?: Yes Type of Wheelchair: Manual Wheelchair activity did not occur: Safety/medical concerns  Wheelchair assist level: Supervision/Verbal cueing Max wheelchair distance: 50    Wheelchair 50 feet with 2 turns activity    Assist    Wheelchair 50 feet with 2 turns activity did not occur: Safety/medical concerns   Assist Level: Supervision/Verbal cueing   Wheelchair 150 feet activity     Assist  Wheelchair 150 feet activity did not occur: Safety/medical concerns       Blood pressure (!) 130/58, pulse 79, temperature 98.3 F (36.8 C), resp. rate 17, height 5\' 6"  (1.676 m), weight 68.3 kg, SpO2 97 %.  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 Antithrombotics: -DVT/anticoagulation:Pharmaceutical:Lovenox -antiplatelet therapy: n/a 3. Pain Management:tylenol prn  7/29- no change- pain 2-3/10- no meds per pt request  7/30- discussed nerve pain again- says it's intermittent and doesn't want meds- con't tylenol- did ask how long it last sand if will go away- explained 90% it does, but can take 1 year.  7/31- Will try Diclofenac 50 mg BID x 5 days/10 doses for L bicipital tendinitis  8/1- now also on R side- so L>R bicipital tendinitis- just got first diclofenac dose at 0830 this AM so don't know if helped. Heat helped some.  4. Mood:patient appears up beat and motivated -team to provide ego support as needed -antipsychotic agents: n/a 5. Neuropsych: This patientiscapable of making decisions on herown behalf. 6. Skin/Wound Care: -Stage II sacral wound 7.  Fluids/Electrolytes/Nutrition:encourage PO 8. Oropharyngeal dysphagia:  D3 nectar thick liquids and no straws  7/27- BUN 13 and Cr 0.39- not dry currently since on nectar thick liquids  7/28- MBS today or tomorrow  7/29- passed MBSS- is regular diet and thin liquids as of now.  9. 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 Elevated LFT's -U/S 7/12 with sludge, borderline hepatic steatosis -f/u U/S in one year 12. Neurogenic bladder Improved, now voiding with minimal residuals 13. Hx of psoriasis -holding enbrel as there is concern that this might have been a contributing factor to her syndrome 14. Recent seizures- resolved -secondary to hyponatremia  15. Bowel incontinence/constipation  7/26- improved since off TFs- having gas- not painful- doesn't want gas-x- con't regimen  7/30- passing a lot of gas- can't tell if stool or gas- said this can occur  Continue to monitor 16.  Leukocytosis  WBCs 10.7 on 7/19, labs ordered for tomorrow  7/26- labs not back yet  7/27- WBC down to 6.8 17.  Acute blood loss anemia  Hemoglobin 11.1 on 7/19, labs ordered for tomorrow  7/26- labs  not back yet  7/27- Hb 11.1- doing better 18. Hypokalemia  7/27- K+ 3.4- will replete with KCL 40 mEqx1 and recheck in AM  7/28-  K+ 3.5- give another 2 doses KCl 40 mEq x2 to bring up to 4.0.  7/30- will recheck labs this weekend   7/31- K+ 4.1- no additional repltion needed right now 19. OSA  7/27- wore CPAP at home- will order auto-titration device until family brings hers in.   8/1- sleeping better 20. L>R bicipital tendinitis  7/31- will try Diclofenac 50 mg BID x 10 doses/5 days and see if it helps.   8/1- wasn't given til 0800 for first time due to not being on  floor- heat did help pain somewhat prior to diclofenac-    LOS: 14 days A FACE TO FACE EVALUATION WAS PERFORMED  Gatlin Kittell 05/02/2020, 11:42 AM

## 2020-05-03 ENCOUNTER — Inpatient Hospital Stay (HOSPITAL_COMMUNITY): Payer: Medicare Other

## 2020-05-03 ENCOUNTER — Inpatient Hospital Stay (HOSPITAL_COMMUNITY): Payer: Medicare Other | Admitting: Speech Pathology

## 2020-05-03 ENCOUNTER — Inpatient Hospital Stay (HOSPITAL_COMMUNITY): Payer: Medicare Other | Admitting: Physical Therapy

## 2020-05-03 LAB — CBC
HCT: 33 % — ABNORMAL LOW (ref 36.0–46.0)
Hemoglobin: 10.6 g/dL — ABNORMAL LOW (ref 12.0–15.0)
MCH: 30.5 pg (ref 26.0–34.0)
MCHC: 32.1 g/dL (ref 30.0–36.0)
MCV: 94.8 fL (ref 80.0–100.0)
Platelets: 194 10*3/uL (ref 150–400)
RBC: 3.48 MIL/uL — ABNORMAL LOW (ref 3.87–5.11)
RDW: 14.4 % (ref 11.5–15.5)
WBC: 5.4 10*3/uL (ref 4.0–10.5)
nRBC: 0 % (ref 0.0–0.2)

## 2020-05-03 LAB — GLUCOSE, CAPILLARY
Glucose-Capillary: 84 mg/dL (ref 70–99)
Glucose-Capillary: 80 mg/dL (ref 70–99)
Glucose-Capillary: 90 mg/dL (ref 70–99)

## 2020-05-03 LAB — BASIC METABOLIC PANEL
Anion gap: 7 (ref 5–15)
BUN: 10 mg/dL (ref 8–23)
CO2: 27 mmol/L (ref 22–32)
Calcium: 8.9 mg/dL (ref 8.9–10.3)
Chloride: 106 mmol/L (ref 98–111)
Creatinine, Ser: 0.5 mg/dL (ref 0.44–1.00)
GFR calc Af Amer: >60 mL/min (ref >60)
GFR calc non Af Amer: >60 mL/min (ref >60)
Glucose, Bld: 94 mg/dL (ref 70–99)
Potassium: 4.1 mmol/L (ref 3.5–5.1)
Sodium: 140 mmol/L (ref 135–145)

## 2020-05-03 MED ORDER — HYDROCERIN EX CREA
TOPICAL_CREAM | Freq: Two times a day (BID) | CUTANEOUS | Status: DC
  Administered 2020-05-05: 1 via TOPICAL
  Filled 2020-05-03: qty 113

## 2020-05-03 NOTE — Progress Notes (Signed)
Patient ID: Alison Huffman, female   DOB: September 27, 1941, 79 y.o.   MRN: [id number]  Met with patient today and she was much more alert and aware than our previous meeting. She is aware that she is being discharged to SNF. She verbalized understanding and had no additional questions. This RN informed her that her belongings would be sent went her along with her discharge packet the day of discharge.  Dorthula Nettles, RN, BSN, [place] [facility] Office [phone] Cell [phone]

## 2020-05-03 NOTE — Progress Notes (Signed)
Occupational Therapy Session Note  Patient Details  Name: Alison Huffman MRN: [id number] Date of Birth: October 08, 1940  Today's Date: 05/03/2020 OT Individual Time: 3794-3276 OT Individual Time Calculation (min): 70 min    Short Term Goals: Week 2:  OT Short Term Goal 1 (Week 2): Pt will complete toilet transfer with max A +2 OT Short Term Goal 2 (Week 2): Pt will perform sit<>stand with max A to facilitate LB clothing management OT Short Term Goal 3 (Week 2): Pt will complete LB dressing tasks with mod A OT Short Term Goal 4 (Week 2): Pt will complete toileting tasks with mod A  Skilled Therapeutic Interventions/Progress Updates:    Pt resting in bed upon arrival.  Pt elected to bathe seated EOB with sit<>stand this morning.  Pt stated she would take a shower tomorrow. Bed mobility with supervision.  Pt completed bathing tasks with CGA when standing. BSC transfer with CGA and toileting with CGA. Pt amb with RW to sink (CGA) to complete grooming tasks. Pt transitioned to Day Room and amb approx 40' to NuStep-7 mins level 3- and returned to w/c.  Pt returned to room and amb with RW from threshold to recliner.  Pt remained in recliner with all needs within reach and seat alarm activated.   Therapy Documentation Precautions:  Precautions Precautions: Fall Precaution Comments: gross limb ataxia, hx of pacemaker, on supplemental oxygen Restrictions Weight Bearing Restrictions: No  Pain:  Pt c/o ongoing tightness across upper abdominal area; MD aware Pt states her shoulders feel better   Therapy/Group: Individual Therapy  Leroy Libman 05/03/2020, 9:32 AM

## 2020-05-03 NOTE — Progress Notes (Signed)
Speech Language Pathology Daily Session Note  Patient Details  Name: Alison Huffman MRN: [id number] Date of Birth: July 21, 1941  Today's Date: 05/03/2020 SLP Individual Time: 1040-4591 SLP Individual Time Calculation (min): 40 min  Short Term Goals: Week 1: SLP Short Term Goal 1 (Week 1): During trials of thin H2O or ice, pt will use compensatory strategies with Min A cues X2 prior to repeat instrumental testing and potential diet advancement. SLP Short Term Goal 1 - Progress (Week 1): Met SLP Short Term Goal 2 (Week 1): Pt will consume therapeutic trials of puree textures with minimal overt s/sx aspiration X2 prior to repeat instrumental testing and potential advancement. SLP Short Term Goal 2 - Progress (Week 1): Met SLP Short Term Goal 3 (Week 1): Pt complete RMT exercises to improve cough strength and breath support with Min A cues. SLP Short Term Goal 3 - Progress (Week 1): Met SLP Short Term Goal 4 (Week 1): Pt will demonstrate ability to recall complex new and/or daily information wiht Min A cues for use of strategies or aids. SLP Short Term Goal 4 - Progress (Week 1): Met SLP Short Term Goal 5 (Week 1): Pt will demonstrate ability to problem solve mildly complex situation with Min A multimodal cues. SLP Short Term Goal 5 - Progress (Week 1): Progressing toward goal Week 2: SLP Short Term Goal 1 (Week 2): STG=LTG due to remaining length of stay  Skilled Therapeutic Interventions:   Patient at supervision level for performing RMT exercises and performed 10 reps for EMST and IMST.  No changes made in resistance. Patient sustained /s/ phoneme for average duration of 12 seconds and /z/ phoneme for average duration of 5 seconds. S/Z ratio was 2.4 (average is 1.0), indicating potential laryngeal pathology. As patient is s/p 8 day intubation and is demonstrating observed improvements in respiratory and vocal function, will monitor and retest while she is here in inpatient rehab unit and recommend  follow up with outpatient versus home health SLP to help in determination of need for ENT evaluation of vocal cords. Patient's voice continues to be hoarse and harsh with instances of pitch breaks. She was able to sustain vowel "ah" for duration of 6 seconds.  Pain Pain Assessment Pain Scale: 0-10 Pain Score: 0-No pain  Therapy/Group: Individual Therapy  Sonia Baller, MA, CCC-SLP Speech Therapy

## 2020-05-03 NOTE — Progress Notes (Signed)
Occupational Therapy Session Note  Patient Details  Name: SHALLYN CONSTANCIO MRN: [id number] Date of Birth: 1941-02-09  Today's Date: 05/03/2020 OT Individual Time: 1300-1325 OT Individual Time Calculation (min): 25 min    Short Term Goals: Week 2:  OT Short Term Goal 1 (Week 2): Pt will complete toilet transfer with max A +2 OT Short Term Goal 2 (Week 2): Pt will perform sit<>stand with max A to facilitate LB clothing management OT Short Term Goal 3 (Week 2): Pt will complete LB dressing tasks with mod A OT Short Term Goal 4 (Week 2): Pt will complete toileting tasks with mod A  Skilled Therapeutic Interventions/Progress Updates:    Pt resting in recliner upon arrival.  OT intervention with focus on sit<>stand and standing balance on noncompliant and compliant surface. Sit<>stand with supervision. Pt engaged in standing activities with small beach ball-diagonals, shoulder flexion, trunk rotation (6X5) on noncompliant surface with supervision and CGA on AirEx pad. Pt required rest breaks between each set. Pt remained in recliner with seat alarm in place.  All needs within reach.   Therapy Documentation Precautions:  Precautions Precautions: Fall Precaution Comments: gross limb ataxia, hx of pacemaker, on supplemental oxygen Restrictions Weight Bearing Restrictions: No  Pain: Pt commented that her shoulders were feeling so much better.  Therapy/Group: Individual Therapy  Leroy Libman 05/03/2020, 2:20 PM

## 2020-05-03 NOTE — Progress Notes (Signed)
Patient ID: Alison Huffman, female   DOB: [birth year], 79 y.o.   MRN: [id number]  SW returned phone call to Amado Coe [phone]) who is covering for [employer] with [facility] [phone]) who inquired about SNF placement. SW returned phone call to inform will provide updates after team conference tomorrow on when pt is ready for d/c.  Loralee Pacas, MSW, [place] Office: [phone] Cell: [phone] Fax: [phone]

## 2020-05-03 NOTE — Progress Notes (Signed)
[facility] PHYSICAL MEDICINE & REHABILITATION PROGRESS NOTE   Subjective/Complaints: L shoulder pain is improved.  Has some dryness in bilateral feet. Soaking them in water.  Continues to have numbness and tingling in bilateral feet and a band along her waist.   ROS:  Pt denies SOB, abd pain, CP, N/V/C/D, and vision changes   Objective:   No results found. Recent Labs    05/03/20 0513  WBC 5.4  HGB 10.6*  HCT 33.0*  PLT 194   Recent Labs    05/01/20 0447 05/03/20 0513  NA 138 140  K 4.1 4.1  CL 105 106  CO2 27 27  GLUCOSE 99 94  BUN 11 10  CREATININE 0.46 0.50  CALCIUM 9.0 8.9    Intake/Output Summary (Last 24 hours) at 05/03/2020 0854 Last data filed at 05/02/2020 1823 Gross per 24 hour  Intake 720 ml  Output --  Net 720 ml     Physical Exam: Vital Signs Blood pressure (!) 136/63, pulse 79, temperature 97.9 F (36.6 C), temperature source Oral, resp. rate 16, height 5\' 6"  (1.676 m), weight 68.3 kg, SpO2 96 %. General: Alert and oriented x 3, No apparent distress HEENT: Head is normocephalic, atraumatic, PERRLA, EOMI, sclera anicteric, oral mucosa pink and moist, dentition intact, ext ear canals clear,  Neck: Supple without JVD or lymphadenopathy Heart: Reg rate and rhythm. No murmurs rubs or gallops Chest: CTA bilaterally without wheezes, rales, or rhonchi; no distress Abdomen: Soft, non-tender, non-distended, bowel sounds positive. Extremities: No clubbing, soaking in water Skin: dry, intact, Musc: No edema in extremities.  L anterior biceps tendon origin sore/TTP- more with lifting arm/biceps activation- still there- slightly notable on R anterior shoulder as well.  Neuro: more engaged. - no change Dysphonia, much improved in last 24 hours Motor: Bilateral upper extremities: 4+ in UEs and 4 to 4+ in LEs- improved- checked biceps, triceps, grip and finger abd; also  KE, DF and PF  Assessment/Plan: 1. Functional deficits secondary to Tetraparesis due to  GBS which require 3+ hours per day of interdisciplinary therapy in a comprehensive inpatient rehab setting.  Physiatrist is providing close team supervision and 24 hour management of active medical problems listed below.  Physiatrist and rehab team continue to assess barriers to discharge/monitor patient progress toward functional and medical goals  Care Tool:  Bathing    Body parts bathed by patient: Right arm, Left arm, Chest, Abdomen, Front perineal area, Right upper leg, Left upper leg, Face, Buttocks, Right lower leg, Left lower leg   Body parts bathed by helper: Right lower leg, Left lower leg     Bathing assist Assist Level: Contact Guard/Touching assist     Upper Body Dressing/Undressing Upper body dressing   What is the patient wearing?: Pull over shirt    Upper body assist Assist Level: Set up assist    Lower Body Dressing/Undressing Lower body dressing      What is the patient wearing?: Incontinence brief, Pants     Lower body assist Assist for lower body dressing: Minimal Assistance - Patient > 75%     Toileting Toileting    Toileting assist Assist for toileting: Contact Guard/Touching assist     Transfers Chair/bed transfer  Transfers assist  Chair/bed transfer activity did not occur: Safety/medical concerns  Chair/bed transfer assist level: Contact Guard/Touching assist Chair/bed transfer assistive device: Programmer, multimedia   Ambulation assist   Ambulation activity did not occur: Safety/medical concerns  Assist level: Contact Guard/Touching assist  Assistive device: Walker-rolling Max distance: 48ft   Walk 10 feet activity   Assist  Walk 10 feet activity did not occur: Safety/medical concerns  Assist level: Contact Guard/Touching assist Assistive device: Walker-rolling   Walk 50 feet activity   Assist Walk 50 feet with 2 turns activity did not occur: Safety/medical concerns (did not perform turns)  Assist level:  Minimal Assistance - Patient > 75% Assistive device: Walker-rolling    Walk 150 feet activity   Assist Walk 150 feet activity did not occur: Safety/medical concerns         Walk 10 feet on uneven surface  activity   Assist Walk 10 feet on uneven surfaces activity did not occur: Safety/medical concerns         Wheelchair     Assist Will patient use wheelchair at discharge?: Yes Type of Wheelchair: Manual Wheelchair activity did not occur: Safety/medical concerns  Wheelchair assist level: Supervision/Verbal cueing Max wheelchair distance: 50    Wheelchair 50 feet with 2 turns activity    Assist    Wheelchair 50 feet with 2 turns activity did not occur: Safety/medical concerns   Assist Level: Supervision/Verbal cueing   Wheelchair 150 feet activity     Assist  Wheelchair 150 feet activity did not occur: Safety/medical concerns       Blood pressure (!) 136/63, pulse 79, temperature 97.9 F (36.6 C), temperature source Oral, resp. rate 16, height 5\' 6"  (1.676 m), weight 68.3 kg, SpO2 96 %.  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 Antithrombotics: -DVT/anticoagulation:Pharmaceutical:Lovenox -antiplatelet therapy: n/a 3. Pain Management:tylenol prn  7/29- no change- pain 2-3/10- no meds per pt request  7/30- discussed nerve pain again- says it's intermittent and doesn't want meds- con't tylenol- did ask how long it last sand if will go away- explained 90% it does, but can take 1 year.  7/31- Will try Diclofenac 50 mg BID x 5 days/10 doses for L bicipital tendinitis  8/1- now also on R side- so L>R bicipital tendinitis- just got first diclofenac dose at 0830 this AM so don't know if helped. Heat helped some.   8/2: voltaren gel has helped bursitis 4. Mood:patient appears up beat and motivated -team to provide ego support as  needed -antipsychotic agents: n/a 5. Neuropsych: This patientiscapable of making decisions on herown behalf. 6. Skin/Wound Care: -Stage II sacral wound  -Dry skin: ordered eucerin 7. Fluids/Electrolytes/Nutrition:encourage PO 8. Oropharyngeal dysphagia:  D3 nectar thick liquids and no straws  7/27- BUN 13 and Cr 0.39- not dry currently since on nectar thick liquids  7/28- MBS today or tomorrow  7/29- passed MBSS- is regular diet and thin liquids as of now.   8/2: tolerating regular diet well. 9. 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 Elevated LFT's -U/S 7/12 with sludge, borderline hepatic steatosis -f/u U/S in one year 12. Neurogenic bladder Improved, now voiding with minimal residuals 13. Hx of psoriasis -holding enbrel as there is concern that this might have been a contributing factor to her syndrome 14. Recent seizures- resolved -secondary to hyponatremia  15. Bowel incontinence/constipation  7/26- improved since off TFs- having gas- not painful- doesn't want gas-x- con't regimen  7/30- passing a lot of gas- can't tell if stool or gas- said this can occur  Continue to monitor 16.  Leukocytosis  WBCs 10.7 on  7/19, labs ordered for tomorrow  7/26- labs not back yet  7/27- WBC down to 6.8 17.  Acute blood loss anemia  Hemoglobin 11.1 on 7/19, labs ordered for tomorrow  7/26- labs not back yet  7/27- Hb 11.1- doing better 18. Hypokalemia  7/27- K+ 3.4- will replete with KCL 40 mEqx1 and recheck in AM  7/28-  K+ 3.5- give another 2 doses KCl 40 mEq x2 to bring up to 4.0.  7/30- will recheck labs this weekend   7/31- K+ 4.1- no additional repltion needed right now 19. OSA  7/27- wore CPAP at home- will  order auto-titration device until family brings hers in.   8/1- sleeping better 20. L>R bicipital tendinitis  7/31- will try Diclofenac 50 mg BID x 10 doses/5 days and see if it helps.   8/1- wasn't given til 0800 for first time due to not being on floor- heat did help pain somewhat prior to diclofenac-    LOS: 15 days A FACE TO FACE EVALUATION WAS PERFORMED  Clide Deutscher Ariana Juul 05/03/2020, 8:54 AM

## 2020-05-04 ENCOUNTER — Inpatient Hospital Stay (HOSPITAL_COMMUNITY): Payer: Medicare Other

## 2020-05-04 ENCOUNTER — Inpatient Hospital Stay (HOSPITAL_COMMUNITY): Payer: Medicare Other | Admitting: Physical Therapy

## 2020-05-04 ENCOUNTER — Inpatient Hospital Stay (HOSPITAL_COMMUNITY): Payer: Medicare Other | Admitting: Speech Pathology

## 2020-05-04 LAB — GLUCOSE, CAPILLARY
Glucose-Capillary: 122 mg/dL — ABNORMAL HIGH (ref 70–99)
Glucose-Capillary: 81 mg/dL (ref 70–99)
Glucose-Capillary: 79 mg/dL (ref 70–99)
Glucose-Capillary: 79 mg/dL (ref 70–99)

## 2020-05-04 LAB — SARS CORONAVIRUS 2 (TAT 6-24 HRS): SARS Coronavirus 2: NEGATIVE

## 2020-05-04 MED ORDER — SENNOSIDES-DOCUSATE SODIUM 8.6-50 MG PO TABS: 1.0000 | ORAL_TABLET | Freq: Every evening | ORAL | Status: DC | PRN

## 2020-05-04 MED ORDER — HYDROCERIN EX CREA: 1.0000 "application " | TOPICAL_CREAM | Freq: Two times a day (BID) | CUTANEOUS | 0 refills | Status: DC

## 2020-05-04 MED ORDER — ENSURE ENLIVE PO LIQD: 237.0000 mL | ORAL | 12 refills | Status: DC

## 2020-05-04 MED ORDER — PANTOPRAZOLE SODIUM 40 MG PO TBEC: 40.0000 mg | DELAYED_RELEASE_TABLET | Freq: Every day | ORAL | Status: DC

## 2020-05-04 NOTE — Discharge Summary (Signed)
Physician Discharge Summary  Patient ID: Alison Huffman MRN: [id number] DOB/AGE: 80-Aug-1942 80 y.o.  Admit date: 04/18/2020 Discharge date: 05/05/2020  Discharge Diagnoses:  Principal Problem:   GBS (Guillain Barre syndrome) ([place]) Active Problems:   Stage II pressure ulcer (HCC)   Malnutrition of moderate degree   Acute blood loss anemia   Leukocytosis   Neurogenic bladder   Oropharyngeal dysphagia   Discharged Condition: stable   Significant Diagnostic Studies:  DG Swallowing Func-Speech Pathology  Result Date: 04/28/2020 Objective Swallowing Evaluation: Type of Study: MBS-Modified Barium Swallow Study  Patient Details Name: Alison Huffman MRN: [id number] Date of Birth: April 20, 1941 Today's Date: 04/28/2020 Past Medical History: Past Medical History: Diagnosis Date . Ankle pain, right  . Atypical chest pain   a. 10/2018 MV: EF 57%. No ischemia/infarct. . Coronary artery disease  . Diastolic dysfunction   a. 04/2018 Echo: EF 55-60%, no rwma, Gr1 DD. Triv AI, mild MR. Mod dil LA. Nl RV fxn. PASP nl. . GERD (gastroesophageal reflux disease)  . Headache   "dull one sometimes daily, at least weekly in last couple months" (07/22/2018) . Hyperlipidemia  . Hypertension  . 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 Joint pain in fingers of right hand  . Lichen sclerosus  . Osteopenia  . Pneumonia 2012? X 1 . 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 Psoriasis  . Psoriatic arthritis ([place])   " dx'd the 1st of this year, 2019" (07/22/2018) . Psoriatic arthritis ([place])  . Rosacea  . Seborrheic keratosis  . Second degree heart block   a. 07/2018 s/p SJM Assurity MRI model PM2271 (Ser# [id number]). . Sleep apnea   "using nasal pilllows since ~ 12/2017" (07/22/2018) . TMJ (dislocation of temporomandibular joint)  Past Surgical History: Past Surgical History: Procedure Laterality Date . APPENDECTOMY   . BREAST CYST ASPIRATION Bilateral  . BREAST CYST EXCISION Right 1978  benign . COLONOSCOPY   . COLONOSCOPY WITH PROPOFOL  N/A 07/10/2019  Procedure: COLONOSCOPY WITH PROPOFOL;  Surgeon: Lollie Sails, MD;  Location: [facility] ENDOSCOPY;  Service: Endoscopy;  Laterality: N/A; . ESOPHAGOGASTRODUODENOSCOPY (EGD) WITH PROPOFOL N/A 07/10/2019  Procedure: ESOPHAGOGASTRODUODENOSCOPY (EGD) WITH PROPOFOL;  Surgeon: Lollie Sails, MD;  Location: [facility] ENDOSCOPY;  Service: Endoscopy;  Laterality: N/A; . HYSTEROSCOPY WITH D & C N/A 11/05/2018  Procedure: DILATATION AND CURETTAGE /HYSTEROSCOPY;  Surgeon: Homero Fellers, MD;  Location: [facility] ORS;  Service: Gynecology;  Laterality: N/A; . INSERT / REPLACE / REMOVE PACEMAKER   . PACEMAKER IMPLANT N/A 07/23/2018  SJM Assurity [id number] implanted by Dr Rayann Heman for mobitz II second degree AV block . UTERINE POLYPS REMOVAL   HPI: Pt is an 79 yo female with presumed [place]. She is a former smoker transferred to [facility] from [place] on 6/26 with weakness, numbness, and tingling after being started on Enbrel for psoriasis. Patient developed severe hyponatremia, had a seizure and was transferred to [facility] on 7/03 for further neurologic assessment.  Required intubation for airway protection on 7/3 and waws extubated on 7/11 but has needed biPAP intermittently for respiratory distress. Pt became unresponsive during MRI on 7/6 and had code blue; sustained bilateral pneumothoraces after bag ventilation for which bilateral chest tubes were placed. MRI was negative for acute changes. Patient started on PLEX plasmaphoresis 7/7. CXR 7/12: Bibasilar atelectasis/infiltrate with no significant interval change.Cortrak placed 7/9  No data recorded Assessment / Plan / Recommendation CHL IP CLINICAL IMPRESSIONS 04/28/2020 Clinical Impression Pt demonstrated improvements in pharyngeal swallow function today in  comparison to last MBSS conducted on 04/21/20. Although flash penetration of thin barium was observed on first trial of thin via cup, it remained above the level of the vocal  folds and was ejected from the vestibule during the swallow (PAS scale 2). Pt also demonstrated decreased bolus cohesion, which resulted in piecemeal swallowing of thin and regular texture boluses, however extra swallows were beneficial to clear mild vallecular residue of thin and were mostly sensed by pt without need for cues from clinician. Use of the supraglottic swallow strategy did not appear to influence airway protection, therefore it is no longer necessary for her to perform during PO intake. Flash penetration also observed X1 when pt consumed barium pill with thin, however no aspiration observed and all penetrate ejected during the swallow. Recommend pt upgrade to regular solid texture diet with thin liquids using slow rate, small sips/bites, and using extra dry swallows with liquids. Medications may be administered whole with thins or whole in applesauce, depending on pt preference. Provide set up and intermittent supervision during meals. ST will continue to provide skilled interventions for dysphagia while inpatient in order to ensure diet safety and efficiency. SLP Visit Diagnosis Dysphagia, oropharyngeal phase (R13.12) Attention and concentration deficit following -- Frontal lobe and executive function deficit following -- Impact on safety and function Mild aspiration risk   CHL IP TREATMENT RECOMMENDATION 04/15/2020 Treatment Recommendations Therapy as outlined in treatment plan below   Prognosis 04/15/2020 Prognosis for Safe Diet Advancement Good Barriers to Reach Goals -- Barriers/Prognosis Comment -- CHL IP DIET RECOMMENDATION 04/28/2020 SLP Diet Recommendations Thin liquid;Regular solids Liquid Administration via Cup;Straw Medication Administration Whole meds with liquid Compensations Slow rate;Small sips/bites;Multiple dry swallows after each bite/sip Postural Changes Remain semi-upright after after feeds/meals (Comment);Seated upright at 90 degrees   CHL IP OTHER RECOMMENDATIONS 04/28/2020  Recommended Consults -- Oral Care Recommendations Oral care BID Other Recommendations --   CHL IP FOLLOW UP RECOMMENDATIONS 04/16/2020 Follow up Recommendations Inpatient Rehab   CHL IP FREQUENCY AND DURATION 04/15/2020 Speech Therapy Frequency (ACUTE ONLY) min 2x/week Treatment Duration 2 weeks      CHL IP ORAL PHASE 04/28/2020 Oral Phase Impaired Oral - Pudding Teaspoon -- Oral - Pudding Cup -- Oral - Honey Teaspoon -- Oral - Honey Cup -- Oral - Nectar Teaspoon -- Oral - Nectar Cup NT Oral - Nectar Straw -- Oral - Thin Teaspoon -- Oral - Thin Cup Decreased bolus cohesion;Piecemeal swallowing Oral - Thin Straw Piecemeal swallowing;Decreased bolus cohesion Oral - Puree NT Oral - Mech Soft NT Oral - Regular Lingual/palatal residue Oral - Multi-Consistency -- Oral - Pill WFL Oral Phase - Comment --  CHL IP PHARYNGEAL PHASE 04/28/2020 Pharyngeal Phase Impaired Pharyngeal- Pudding Teaspoon -- Pharyngeal -- Pharyngeal- Pudding Cup -- Pharyngeal -- Pharyngeal- Honey Teaspoon NT Pharyngeal -- Pharyngeal- Honey Cup -- Pharyngeal -- Pharyngeal- Nectar Teaspoon NT Pharyngeal -- Pharyngeal- Nectar Cup -- Pharyngeal -- Pharyngeal- Nectar Straw -- Pharyngeal -- Pharyngeal- Thin Teaspoon NT Pharyngeal -- Pharyngeal- Thin Cup Reduced epiglottic inversion;Reduced anterior laryngeal mobility;Reduced laryngeal elevation;Penetration/Aspiration during swallow;Pharyngeal residue - valleculae;Compensatory strategies attempted (with notebox) Pharyngeal Material enters airway, remains ABOVE vocal cords then ejected out Pharyngeal- Thin Straw Reduced epiglottic inversion;Reduced laryngeal elevation;Reduced anterior laryngeal mobility;Penetration/Aspiration during swallow;Pharyngeal residue - valleculae Pharyngeal Material enters airway, remains ABOVE vocal cords then ejected out Pharyngeal- Puree NT Pharyngeal -- Pharyngeal- Mechanical Soft NT Pharyngeal -- Pharyngeal- Regular WFL Pharyngeal -- Pharyngeal- Multi-consistency -- Pharyngeal --  Pharyngeal- Pill WFL Pharyngeal -- Pharyngeal Comment --  CHL IP  CERVICAL ESOPHAGEAL PHASE 04/28/2020 Cervical Esophageal Phase WFL Pudding Teaspoon -- Pudding Cup -- Honey Teaspoon -- Honey Cup -- Nectar Teaspoon -- Nectar Cup -- Nectar Straw -- Thin Teaspoon -- Thin Cup -- Thin Straw -- Puree -- Mechanical Soft -- Regular -- Multi-consistency -- Pill -- Cervical Esophageal Comment -- Alison Huffman 04/28/2020, 9:53 AM              DG Swallowing Func-Speech Pathology  Result Date: 04/21/2020 Objective Swallowing Evaluation: Type of Study: MBS-Modified Barium Swallow Study  Patient Details Name: Alison Huffman MRN: [id number] Date of Birth: 1940/11/30 Today's Date: 04/21/2020 Past Medical History: Past Medical History: Diagnosis Date . Ankle pain, right  . Atypical chest pain   a. 10/2018 MV: EF 57%. No ischemia/infarct. . Coronary artery disease  . Diastolic dysfunction   a. 04/2018 Echo: EF 55-60%, no rwma, Gr1 DD. Triv AI, mild MR. Mod dil LA. Nl RV fxn. PASP nl. . GERD (gastroesophageal reflux disease)  . Headache   "dull one sometimes daily, at least weekly in last couple months" (07/22/2018) . Hyperlipidemia  . Hypertension  . 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 Joint pain in fingers of right hand  . Lichen sclerosus  . Osteopenia  . Pneumonia 2012? X 1 . 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 Psoriasis  . Psoriatic arthritis ([place])   " dx'd the 1st of this year, 2019" (07/22/2018) . Psoriatic arthritis ([place])  . Rosacea  . Seborrheic keratosis  . Second degree heart block   a. 07/2018 s/p SJM Assurity MRI model PM2271 (Ser# [id number]). . Sleep apnea   "using nasal pilllows since ~ 12/2017" (07/22/2018) . TMJ (dislocation of temporomandibular joint)  Past Surgical History: Past Surgical History: Procedure Laterality Date . APPENDECTOMY   . BREAST CYST ASPIRATION Bilateral  . BREAST CYST EXCISION Right 1978  benign . COLONOSCOPY   . COLONOSCOPY WITH PROPOFOL N/A 07/10/2019  Procedure: COLONOSCOPY WITH PROPOFOL;   Surgeon: Lollie Sails, MD;  Location: [facility] ENDOSCOPY;  Service: Endoscopy;  Laterality: N/A; . ESOPHAGOGASTRODUODENOSCOPY (EGD) WITH PROPOFOL N/A 07/10/2019  Procedure: ESOPHAGOGASTRODUODENOSCOPY (EGD) WITH PROPOFOL;  Surgeon: Lollie Sails, MD;  Location: [facility] ENDOSCOPY;  Service: Endoscopy;  Laterality: N/A; . HYSTEROSCOPY WITH D & C N/A 11/05/2018  Procedure: DILATATION AND CURETTAGE /HYSTEROSCOPY;  Surgeon: Homero Fellers, MD;  Location: [facility] ORS;  Service: Gynecology;  Laterality: N/A; . INSERT / REPLACE / REMOVE PACEMAKER   . PACEMAKER IMPLANT N/A 07/23/2018  SJM Assurity [id number] implanted by Dr Rayann Heman for mobitz II second degree AV block . UTERINE POLYPS REMOVAL   HPI: Pt is an 79 yo female with presumed [place]. She is a former smoker transferred to [facility] from [place] on 6/26 with weakness, numbness, and tingling after being started on Enbrel for psoriasis. Patient developed severe hyponatremia, had a seizure and was transferred to [facility] on 7/03 for further neurologic assessment.  Required intubation for airway protection on 7/3 and waws extubated on 7/11 but has needed biPAP intermittently for respiratory distress. Pt became unresponsive during MRI on 7/6 and had code blue; sustained bilateral pneumothoraces after bag ventilation for which bilateral chest tubes were placed. MRI was negative for acute changes. Patient started on PLEX plasmaphoresis 7/7. CXR 7/12: Bibasilar atelectasis/infiltrate with no significant interval change.Cortrak placed 7/9  No data recorded Assessment / Plan / Recommendation CHL IP CLINICAL IMPRESSIONS 04/21/2020 Clinical Impression Pt exhibits mild-mod oropharyngeal dysphagia characterized by a mix of sensed and silent aspiration of  thin liquids via straw and cup. Nearly absent epiglottic inversion noted across consistencies, which is likely impacted by NG tube placement, and unfortunately also decreases airway protection.  Reduced base of tongue retraction and difficulty with oral containment of thin boluses resulted in premature spillage into the pharynx at times. Mild vallecular and pyriform sinus residue noted across thin and nectar, but not significant difference between the two in terms of quantity of residue. Pt's swallow initiation was more timely and she appeared to have better oral control of nectar thick boluses in comparison to thin, and no penetration or aspiration was observed. Hard and extra dry swallow was moderately effective to clear residue. Puree and mechanical soft solid trials were WFL, no airway intrusion or difficulty with mastication or oral clearance noted. Given pt's recent history of respiratory failure and fragile medical state with reduced physical mobility at this time and chronic diagnosis Guillain-Barre Syndrome, would recommend initiation of conservative Dysphagia 3 (mechanical soft), nectar thick liquid diet, no straws, medications may be given whole in applesauce. Pt also educated on continuing hard swallow and extra dry swallow during intake. Please provide intermittent supervision during meals to ensure use of strategies. ST will continue to provide skilled interventions to work toward further advancement and independence with use of strategies while inpatient. SLP Visit Diagnosis Dysphagia, oropharyngeal phase (R13.12) Attention and concentration deficit following -- Frontal lobe and executive function deficit following -- Impact on safety and function Moderate aspiration risk   CHL IP TREATMENT RECOMMENDATION 04/15/2020 Treatment Recommendations Therapy as outlined in treatment plan below   Prognosis 04/15/2020 Prognosis for Safe Diet Advancement Good Barriers to Reach Goals -- Barriers/Prognosis Comment -- CHL IP DIET RECOMMENDATION 04/21/2020 SLP Diet Recommendations Dysphagia 3 (Mech soft) solids;Nectar thick liquid Liquid Administration via Cup;No straw Medication Administration Whole meds with  puree Compensations Effortful swallow;Multiple dry swallows after each bite/sip;Small sips/bites;Slow rate Postural Changes Remain semi-upright after after feeds/meals (Comment);Seated upright at 90 degrees   CHL IP OTHER RECOMMENDATIONS 04/21/2020 Recommended Consults -- Oral Care Recommendations Oral care BID Other Recommendations Remove water pitcher;Order thickener from pharmacy;Have oral suction available   CHL IP FOLLOW UP RECOMMENDATIONS 04/16/2020 Follow up Recommendations Inpatient Rehab   CHL IP FREQUENCY AND DURATION 04/15/2020 Speech Therapy Frequency (ACUTE ONLY) min 2x/week Treatment Duration 2 weeks      CHL IP ORAL PHASE 04/21/2020 Oral Phase Impaired Oral - Pudding Teaspoon -- Oral - Pudding Cup -- Oral - Honey Teaspoon -- Oral - Honey Cup -- Oral - Nectar Teaspoon -- Oral - Nectar Cup Piecemeal swallowing Oral - Nectar Straw -- Oral - Thin Teaspoon -- Oral - Thin Cup Piecemeal swallowing;Premature spillage;Holding of bolus Oral - Thin Straw Piecemeal swallowing;Premature spillage Oral - Puree WFL Oral - Mech Soft WFL Oral - Regular -- Oral - Multi-Consistency -- Oral - Pill -- Oral Phase - Comment --  CHL IP PHARYNGEAL PHASE 04/21/2020 Pharyngeal Phase Impaired Pharyngeal- Pudding Teaspoon -- Pharyngeal -- Pharyngeal- Pudding Cup -- Pharyngeal -- Pharyngeal- Honey Teaspoon NT Pharyngeal -- Pharyngeal- Honey Cup -- Pharyngeal -- Pharyngeal- Nectar Teaspoon NT Pharyngeal -- Pharyngeal- Nectar Cup -- Pharyngeal -- Pharyngeal- Nectar Straw -- Pharyngeal -- Pharyngeal- Thin Teaspoon NT Pharyngeal -- Pharyngeal- Thin Cup Delayed swallow initiation-vallecula;Reduced anterior laryngeal mobility;Reduced airway/laryngeal closure;Reduced epiglottic inversion;Reduced tongue base retraction;Penetration/Aspiration during swallow;Pharyngeal residue - valleculae;Pharyngeal residue - pyriform Pharyngeal Material enters airway, passes BELOW cords without attempt by patient to eject out (silent aspiration) Pharyngeal-  Thin Straw Reduced epiglottic inversion;Reduced anterior laryngeal mobility;Reduced airway/laryngeal closure;Reduced tongue base  retraction;Penetration/Aspiration during swallow;Pharyngeal residue - valleculae;Pharyngeal residue - pyriform Pharyngeal Material enters airway, passes BELOW cords and not ejected out despite cough attempt by patient Pharyngeal- Puree WFL Pharyngeal -- Pharyngeal- Mechanical Soft WFL Pharyngeal -- Pharyngeal- Regular -- Pharyngeal -- Pharyngeal- Multi-consistency -- Pharyngeal -- Pharyngeal- Pill -- Pharyngeal -- Pharyngeal Comment --  CHL IP CERVICAL ESOPHAGEAL PHASE 04/21/2020 Cervical Esophageal Phase WFL Pudding Teaspoon -- Pudding Cup -- Honey Teaspoon -- Honey Cup -- Nectar Teaspoon -- Nectar Cup -- Nectar Straw -- Thin Teaspoon -- Thin Cup -- Thin Straw -- Puree -- Mechanical Soft -- Regular -- Multi-consistency -- Pill -- Cervical Esophageal Comment -- Alison Huffman 04/21/2020, 10:31 AM               US Abdomen Limited RUQ  Result Date: 04/12/2020 CLINICAL DATA:  Elevated alkaline phosphatase level. EXAM: ULTRASOUND ABDOMEN LIMITED RIGHT UPPER QUADRANT COMPARISON:  None. FINDINGS: Gallbladder: Physiologically distended. Mild layering sludge. Rounded echogenic structure without posterior shadowing measuring 9 mm in the gallbladder is likely a sludge ball. Possibility of polyp is difficult to exclude. No internal vascularity. No shadowing gallstones. No gallbladder wall thickening. No pericholecystic fluid. No sonographic Murphy sign noted by sonographer. Common bile duct: Diameter: 2-3 mm, normal. Liver: No focal lesion identified. Mildly increased in parenchymal echogenicity. Portal vein is patent on color Doppler imaging with normal direction of blood flow towards the liver. Other: Right pleural effusion. IMPRESSION: 1. Borderline hepatic steatosis. 2. Gallbladder sludge. Probable 10 mm sludge ball, however gallbladder polyp is difficult to exclude. Recommend follow-up as a  gallbladder polyp, ultrasound in 1 year. 3. No gallstones or sonographic evidence of acute gallbladder inflammation. No biliary dilatation. Electronically Signed   By: Keith Rake M.D.   On: 04/12/2020 22:26    Labs:  Basic Metabolic Panel: Recent Labs  Lab 05/01/20 0447 05/03/20 0513  NA 138 140  K 4.1 4.1  CL 105 106  CO2 27 27  GLUCOSE 99 94  BUN 11 10  CREATININE 0.46 0.50  CALCIUM 9.0 8.9  MG 2.1  --     CBC: CBC Latest Ref Rng & Units 05/03/2020 04/26/2020 04/19/2020  WBC 4.0 - 10.5 K/uL 5.4 6.8 10.7(H)  Hemoglobin 12.0 - 15.0 g/dL 10.6(L) 11.1(L) 11.1(L)  Hematocrit 36 - 46 % 33.0(L) 34.8(L) 35.0(L)  Platelets 150 - 400 K/uL 194 244 405(H)    CBG: Recent Labs  Lab 05/03/20 0601 05/03/20 1138 05/03/20 1647 05/04/20 0627 05/05/20 0615  GLUCAP 84 80 90 79  79 96    Brief HPI:   Alison Huffman is a 79 y.o. female with history of CAD, PPM, HTN, OSA, psoriatic arthritis who was admitted to [facility] on 03/27/2020 with reports of numbness and tingling started in BLE, progressed to toes of and BUE as well as weakness.  Neurology consulted for input and expressed concerns of GBS with progressive numbness/weakness with decreased reflexes.  LP negative but she was started on IVIG empirically.  Hospital course significant for severe hyponatremia treated with fluid restriction.  Due to poor response, she was transferred to [facility] for MRI and further management but developed seizure shortly after transfer with respiratory distress requiring intubation.   EEG was negative for seizures therefore Keppra was discontinued and seizure felt to be due to hyponatremia.   Hospital course significant for respiratory distress due to mucous plugging with CODE BLUE requiring manual ventilation.  She did develop bilateral pneumothoraces due to high pressure requiring bilateral chest tube.  MRI brain and spine was negative for acute abnormality or significant stenosis.   She was treated with plasmapheresis with improvement in strength.  She tolerated extubation but required course of Solu-Medrol due to stridor as well as cefepime for staph HCAP.  She was maintained on n.p.o. status due to severe dysphagia with tube feeds ongoing.  Foley was in place due to urinary retention.  Dr. Malen Gauze felt that patient likely with GBS rather than Enbrel associated polyneuropathy given temporal association.  He recommended follow-up EMG/NCS and follow-up with neuromuscular specialist after discharge.  Therapy was ongoing revealing significant deficits in mobility and ADLs.  CIR was recommended due to functional decline.   Hospital Course: Alison Huffman was admitted to rehab 04/18/2020 for inpatient therapies to consist of PT, ST and OT at least three hours five days a week. Past admission physiatrist, therapy team and rehab RN have worked together to provide customized collaborative inpatient rehab. Her blood pressures were monitored on TID basis and has been stable. Stage II sacral wound is healing well and Eucerin added to dry skin. Neuropathy is improving and patient declined any mediations for treatment. Bicipital tendinitis has improved with diclofenac X 3 days as well as heat for local measures.  She has been seizure free during her stay. Foley was removed and she was started on bladder program. Voiding function has improved and she is voiding well. Bowel function has improved with resolution of diarrhea and bloating/gas off off tube feeds.   She reported use of CPAP at home and this was resumed on 07/27 with improvement in sleep hygiene. Follow up BMET showed hypokalemia which has resolved with supplementation and improvement in intake. Tube feed was discontinued as swallow function has improved and she is tolerating regular diet with intermittent supervision. Renal status is stable and abnormal LFTs have resolved.  Follow up CBC showed that leucocytosis has resolved and H/H is stable  overall. She has made steady progress during her rehab stay but continues to be limited by endurance levels and requires CGA with activity. Due to lack of social support, SNF was recommended for progressive therapy. She was discharged to [facility] on 05/05/20   Rehab course: During patient's stay in rehab weekly team conferences were held to monitor patient's progress, set goals and discuss barriers to discharge. At admission, patient required max to total assist for ADL tasks and max  assist with mobility. She exhibited dysphagia and n.p.o. was recommended with tube feeds for nutritional support.  She also reported mild cognitive deficits with delay in complex problem-solving and recall of new information. She  has had improvement in activity tolerance, balance, postural control as well as ability to compensate for deficits. She is able to complete ADL tasks with CGA to supervision level. She is modified independent for bed mobility with use of bedrail. She requires CGA for transfers and for balance. She is able to ambulate 38' with RW and CGA.  She is able to perform vocal exercises, follow swallow precautions and use RMST with cues at ,modified independent to supervision level.   Disposition: Skilled Nursing Facility  Diet: Regular textures. Small bites/sips. Intermittent supervision for safety.   Special Instructions: 1. K pad to bilateral shoulders prn. 2. Continue CPAP at nights.  3. Will need repeat GB ultrasound in a year to follow up on GB polyp.     Discharge Instructions    Ambulatory referral to Neurology   Complete by: As directed    An appointment is  requested in approximately: 2-3 weeks   Ambulatory referral to Physical Medicine Rehab   Complete by: As directed    3-4 weeks follow up appt     Allergies as of 05/05/2020      Reactions   Nickel       Medication List    STOP taking these medications   ceFAZolin 2-4 GM/100ML-% IVPB Commonly known as: ANCEF    Clobetasol Propionate E 0.05 % emollient cream Generic drug: Clobetasol Prop Emollient Base   diphenhydrAMINE 25 mg capsule Commonly known as: BENADRYL   enoxaparin 40 MG/0.4ML injection Commonly known as: LOVENOX   free water Soln   guaiFENesin 100 MG/5ML Soln Commonly known as: ROBITUSSIN   insulin aspart 100 UNIT/ML injection Commonly known as: novoLOG   ipratropium-albuterol 0.5-2.5 (3) MG/3ML Soln Commonly known as: DUONEB   metroNIDAZOLE 0.75 % cream Commonly known as: METROCREAM   nystatin cream Commonly known as: MYCOSTATIN   ondansetron 4 MG tablet Commonly known as: ZOFRAN   pantoprazole sodium 40 mg/20 mL Pack Commonly known as: PROTONIX Replaced by: pantoprazole 40 MG tablet     TAKE these medications   acetaminophen 325 MG tablet Commonly known as: TYLENOL Take 325-650 mg by mouth every 6 (six) hours as needed for mild pain or headache.   bisacodyl 10 MG suppository Commonly known as: DULCOLAX Place 1 suppository (10 mg total) rectally daily as needed for moderate constipation.   chlorhexidine 0.12 % solution Commonly known as: PERIDEX 15 mLs by Mouth Rinse route 2 (two) times daily.   desonide 0.05 % ointment Commonly known as: DESOWEN Apply 1 application topically 2 (two) times daily as needed (for psoriasis).   feeding supplement (ENSURE ENLIVE) Liqd Take 237 mLs by mouth daily. What changed:   how much to take  how to take this  when to take this   hydrocerin Crea Apply 1 application topically 2 (two) times daily. To bilateral feet.   mouth rinse Liqd solution 15 mLs by Mouth Rinse route 2 times daily at 12 noon and 4 pm.   pantoprazole 40 MG tablet Commonly known as: PROTONIX Take 1 tablet (40 mg total) by mouth daily. Replaces: pantoprazole sodium 40 mg/20 mL Pack   polyethylene glycol 17 g packet Commonly known as: MIRALAX / GLYCOLAX Take 17 g by mouth daily as needed for severe constipation.   senna-docusate 8.6-50 MG  tablet Commonly known as: Senokot-S Take 1 tablet by mouth at bedtime as needed for mild constipation.   tacrolimus 0.1 % ointment Commonly known as: PROTOPIC Apply topically 2 (two) times daily as needed (for psoriasis).       Contact information for follow-up providers    Lovorn, Jinny Blossom, MD Follow up.   Specialty: Physical Medicine and Rehabilitation Why: Office will call you with follow up appointment Contact information: [address] [place] [address] [phone]        Leone Haven, MD. Call.   Specialty: Family Medicine Why: for post hospital follow up Contact information: [address] [place] [address] [place], Chauvin, DO Follow up.   Specialty: Neurology Why: Office will call you with follow up appointment Contact information: [place] [address] [phone]            Contact information for after-discharge care    Destination    [facility]erred [facility] .   Service: Skilled Nursing Contact information:  [address]                  Signed: Bary Leriche 05/05/2020, 11:50 AM

## 2020-05-04 NOTE — Progress Notes (Signed)
Patient ID: Alison Huffman, female   DOB: 1941-08-23, 79 y.o.   MRN: [id number]  SW spoke with Amado Coe [phone]) who is covering for [employer] with [place] [phone])  to inform on pt d/c tomorrow. Confirms transportation will pick up pt tomorrow at 2pm.  SW called pt niece [name] to inform on above. SW met with pt in room to inform as well.   Loralee Pacas, MSW, [place] Office: [phone] Cell: [phone] Fax: [phone]

## 2020-05-04 NOTE — Progress Notes (Signed)
Occupational Therapy Session Note  Patient Details  Name: Alison Huffman MRN: [id number] Date of Birth: 1940/10/23  Today's Date: 05/04/2020 OT Individual Time: 0071-2197 OT Individual Time Calculation (min): 75 min    Short Term Goals: Week 2:  OT Short Term Goal 1 (Week 2): Pt will complete toilet transfer with max A +2 OT Short Term Goal 2 (Week 2): Pt will perform sit<>stand with max A to facilitate LB clothing management OT Short Term Goal 3 (Week 2): Pt will complete LB dressing tasks with mod A OT Short Term Goal 4 (Week 2): Pt will complete toileting tasks with mod A  Skilled Therapeutic Interventions/Progress Updates:    OT intervention with focus on bathing at shower level and dressing with sit<>stand from seat and EOB. See Care Tool for assist levels.  Pt amb into bathroom with RW and used toilet prior to transferring to shower for bathing.  Pt returned to room and sat EOB to complete LB dressing tasks. Pt amb with RW to sink to complete grooming while standing. Pt amb with RW to ADL apartment (129') without rest break. Pt returned to room and remained seated in recliner with all needs within reach and seat alarm activated. Pt pleased with progress and ready for discharge tomorrow.   Therapy Documentation Precautions:  Precautions Precautions: Fall Precaution Comments: gross limb ataxia, hx of pacemaker, on supplemental oxygen Restrictions Weight Bearing Restrictions: No  Pain:  Pt denies pain this morning   Therapy/Group: Individual Therapy  Leroy Libman 05/04/2020, 12:15 PM

## 2020-05-04 NOTE — Progress Notes (Signed)
Occupational Therapy Discharge Summary  Patient Details  Name: Alison Huffman MRN: [id number] Date of Birth: 05/21/1941  Patient has met 12 of 12 long term goals due to improved activity tolerance, improved balance, postural control and improved coordination. Pt made steady progress with BADLs and functional transfers during this admission. Pt completes all bathing/dressing tasks with supervision/CGA. Functional transfers to toilet and shower with CGA. Pt discharging to SNF for continued rehab before returning to [place] apartment.  Patient to discharge at overall Supervision level.  Patient's care partner unavailable to provide the necessary physical assistance at discharge.     Recommendation:  Patient will benefit from ongoing skilled OT services in skilled nursing facility setting to continue to advance functional skills in the area of BADL and Reduce care partner burden.  Equipment: No equipment provided discharge to SNF  Reasons for discharge: treatment goals met and discharge from hospital  Patient/family agrees with progress made and goals achieved: Yes  OT Discharge Vision Baseline Vision/History: Wears glasses Wears Glasses: Distance only Patient Visual Report: No change from baseline Perception  Perception: Within Functional Limits Praxis Praxis: Intact Cognition Overall Cognitive Status: Within Functional Limits for tasks assessed Arousal/Alertness: Awake/alert Orientation Level: Oriented X4 Memory: Appears intact Awareness: Appears intact Safety/Judgment: Appears intact Sensation Sensation Light Touch: Impaired by gross assessment Peripheral sensation comments: reports tingling in BLE & B hands Hot/Cold: Appears Intact Proprioception: Appears Intact Coordination Gross Motor Movements are Fluid and Coordinated: Yes Fine Motor Movements are Fluid and Coordinated: Yes Motor  Motor Motor - Skilled Clinical Observations: generalized weakness     Trunk/Postural Assessment  Cervical Assessment Cervical Assessment: Within Functional Limits Thoracic Assessment Thoracic Assessment: Within Functional Limits Lumbar Assessment Lumbar Assessment: Within Functional Limits  Balance Static Sitting Balance Static Sitting - Balance Support: Feet supported Static Sitting - Level of Assistance: 6: Modified independent (Device/Increase time) Dynamic Sitting Balance Dynamic Sitting - Balance Support: Feet supported;During functional activity Dynamic Sitting - Level of Assistance: 5: Stand by assistance Extremity/Trunk Assessment RUE Assessment RUE Assessment: Within Functional Limits LUE Assessment LUE Assessment: Within Functional Limits   Leroy Libman 05/04/2020, 12:19 PM

## 2020-05-04 NOTE — Patient Care Conference (Signed)
Inpatient RehabilitationTeam Conference and Plan of Care Update Date: 05/04/2020   Time: 11:11 AM    Patient Name: Alison Huffman      Medical Record Number: [id number]  Date of Birth: 10/16/1940 Sex: Female         Room/Bed: 4M07C/4M07C-01 Payor Info: Payor: MEDICARE / Plan: MEDICARE PART A AND B / Product Type: *No Product type* /    Admit Date/Time:  04/18/2020  1:06 PM  Primary Diagnosis:  GBS (Guillain Barre syndrome) [facility])  Hospital Problems: Principal Problem:   GBS (Guillain Barre syndrome) ([place]) Active Problems:   Stage II pressure ulcer ([place])   Malnutrition of moderate degree   Acute blood loss anemia   Leukocytosis   Neurogenic bladder   Incontinence of feces   Oropharyngeal dysphagia    Expected Discharge Date: Expected Discharge Date: 05/05/20  Team Members Present: Physician leading conference: Dr. Leeroy Cha Care Coodinator Present: Loralee Pacas, LCSWA;Merit Maybee Creig Hines, RN, BSN, [place] Nurse Present: Other (comment) Terrilee Croak McGloster, RN) PT Present: Excell Seltzer, PT OT Present: Willeen Cass, OT;Roanna Epley, COTA SLP Present: Other (comment) PPS Coordinator present : Ileana Ladd, Burna Mortimer, SLP     Current Status/Progress Goal Weekly Team Focus  Bowel/Bladder   Pt continent of B/B. LBM 05/03/2020  Remain continent  Assess B/B patterns every shift   Swallow/Nutrition/ Hydration   Regular solids, thin liquids, intermittent supervision  supervision  repeat MBS on 7/28 with upgrade to rthin liquids, upgraded to regular solids, education on swallow safety precautions   ADL's   bathing-CGA at shower level and sitting EOB; UB dressing-supervisoin; LB dressing-min A/CGA; functional transfers-CGA; toileting-CGA  min A overall except supervision for UB dressing  activity tolerance, sitting balance, functional tranfsers, safety awareness, education   Mobility   CGA overall bed mobility, CGA transfers with RW, gait up to 16' with RW CGA to min A   min A, can likely be upgraded  endurance, strengthening, gait training   Communication             Safety/Cognition/ Behavioral Observations  supervision to mod I  supervision  complex problem solving   Pain   Intermittent pain to left should. Improved with scheduled votaren  Pain level <3  Pain assess every shift and PRN   Skin   Healing stage II to right buttock  No new skin breakdown  Assess skin every shift and PRN     Team Discussion:  Discharge Planning/Teaching Needs:  D/c to SNF within independent living [facility] in [place]. Facility is ready to accept pt when she is medically ready. Facility will provide transportation; can pickup on 8/4 at 2pm.  N/A   Current Update:  None  Current Barriers to Discharge:  No barriers  Possible Resolutions to Barriers: none  Patient on target to meet rehab goals: yes, min assist/contact guard. Patient at goal level.  *See Care Plan and progress notes for long and short-term goals.   Revisions to Treatment Plan:  MD to discontinue insulin orders.    Medical Summary Current Status: Shoulder pain has resolved, sleeping well, tolerating eating well, CBGs well controlled, hoarseness Weekly Focus/Goal: diclofenac stopped, trazodone stopped, continue regular diet, stop CBG checks, continued vocal training  Barriers to Discharge: Medical stability;Decreased family/caregiver support  Barriers to Discharge Comments: Decreased family support Possible Resolutions to Barriers: Plan for DC to her independent living facility tomorrow   Continued Need for Acute Rehabilitation Level of Care: The patient requires daily medical management by a  physician with specialized training in physical medicine and rehabilitation for the following reasons: Direction of a multidisciplinary physical rehabilitation program to maximize functional independence : Yes Medical management of patient stability for increased activity during  participation in an intensive rehabilitation regime.: Yes Analysis of laboratory values and/or radiology reports with any subsequent need for medication adjustment and/or medical intervention. : Yes   I attest that I was present, lead the team conference, and concur with the assessment and plan of the team.   Cristi Loron 05/04/2020, 4:37 PM

## 2020-05-04 NOTE — Progress Notes (Signed)
[facility] PHYSICAL MEDICINE & REHABILITATION PROGRESS NOTE   Subjective/Complaints: L shoulder pain improved. Doing great with therapy. Sleeping well at night.  Stopped PRN trazodone.   ROS:  Pt denies SOB, abd pain, CP, N/V/C/D, and vision changes   Objective:   No results found. Recent Labs    05/03/20 0513  WBC 5.4  HGB 10.6*  HCT 33.0*  PLT 194   Recent Labs    05/03/20 0513  NA 140  K 4.1  CL 106  CO2 27  GLUCOSE 94  BUN 10  CREATININE 0.50  CALCIUM 8.9    Intake/Output Summary (Last 24 hours) at 05/04/2020 0904 Last data filed at 05/03/2020 1700 Gross per 24 hour  Intake 600 ml  Output 0 ml  Net 600 ml     Physical Exam: Vital Signs Blood pressure (!) 122/52, pulse 76, temperature 97.6 F (36.4 C), temperature source Oral, resp. rate 16, height 5\' 6"  (1.676 m), weight 68.3 kg, SpO2 96 %. General: Alert and oriented x 3, No apparent distress HEENT: Head is normocephalic, atraumatic, PERRLA, EOMI, sclera anicteric, oral mucosa pink and moist, dentition intact, ext ear canals clear,  Neck: Supple without JVD or lymphadenopathy Heart: Reg rate and rhythm. No murmurs rubs or gallops Chest: CTA bilaterally without wheezes, rales, or rhonchi; no distress Abdomen: Soft, non-tender, non-distended, bowel sounds positive. Extremities: No clubbing, cyanosis, or edema. Pulses are 2+ Skin: dry, intact, Musc: No edema in extremities.  L anterior biceps tendon origin sore/TTP- more with lifting arm/biceps activation- still there- slightly notable on R anterior shoulder as well.  Neuro: more engaged. - no change Dysphonia, much improved in last 24 hours Motor: Bilateral upper extremities: 4+ in UEs and 4 to 4+ in LEs- improved- checked biceps, triceps, grip and finger abd; also  KE, DF and PF  Assessment/Plan: 1. 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  Physiatrist is providing close team supervision and 24 hour management of active medical problems listed below.  Physiatrist and rehab team continue to assess barriers to discharge/monitor patient progress toward functional and medical goals  Care Tool:  Bathing    Body parts bathed by patient: Right arm, Left arm, Chest, Abdomen, Front perineal area, Right upper leg, Left upper leg, Face, Buttocks, Right lower leg, Left lower leg   Body parts bathed by helper: Right lower leg, Left lower leg     Bathing assist Assist Level: Contact Guard/Touching assist     Upper Body Dressing/Undressing Upper body dressing   What is the patient wearing?: Pull over shirt    Upper body assist Assist Level: Set up assist    Lower Body Dressing/Undressing Lower body dressing      What is the patient wearing?: Incontinence brief, Pants     Lower body assist Assist for lower body dressing: Minimal Assistance - Patient > 75%     Toileting Toileting    Toileting assist Assist for toileting: Contact Guard/Touching assist     Transfers Chair/bed transfer  Transfers assist  Chair/bed transfer activity did not occur: Safety/medical concerns  Chair/bed transfer assist level: Contact Guard/Touching assist Chair/bed transfer assistive device: Programmer, multimedia   Ambulation assist   Ambulation activity did not occur: Safety/medical concerns  Assist level: Contact Guard/Touching assist Assistive device: Walker-rolling Max distance: 93ft   Walk 10 feet activity   Assist  Walk 10 feet activity did not  occur: Safety/medical concerns  Assist level: Contact Guard/Touching assist Assistive device: Walker-rolling   Walk 50 feet activity   Assist Walk 50 feet with 2 turns activity did not occur: Safety/medical concerns (did not perform turns)  Assist level: Minimal Assistance - Patient > 75% Assistive device: Walker-rolling    Walk 150 feet  activity   Assist Walk 150 feet activity did not occur: Safety/medical concerns         Walk 10 feet on uneven surface  activity   Assist Walk 10 feet on uneven surfaces activity did not occur: Safety/medical concerns         Wheelchair     Assist Will patient use wheelchair at discharge?: Yes Type of Wheelchair: Manual Wheelchair activity did not occur: Safety/medical concerns  Wheelchair assist level: Supervision/Verbal cueing Max wheelchair distance: 50    Wheelchair 50 feet with 2 turns activity    Assist    Wheelchair 50 feet with 2 turns activity did not occur: Safety/medical concerns   Assist Level: Supervision/Verbal cueing   Wheelchair 150 feet activity     Assist  Wheelchair 150 feet activity did not occur: Safety/medical concerns       Blood pressure (!) 122/52, pulse 76, temperature 97.6 F (36.4 C), temperature source Oral, resp. rate 16, height 5\' 6"  (1.676 m), weight 68.3 kg, SpO2 96 %.  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 Antithrombotics: -DVT/anticoagulation:Pharmaceutical:Lovenox -antiplatelet therapy: n/a 3. Pain Management:tylenol prn  7/29- no change- pain 2-3/10- no meds per pt request  7/30- discussed nerve pain again- says it's intermittent and doesn't want meds- con't tylenol- did ask how long it last sand if will go away- explained 90% it does, but can take 1 year.  7/31- Will try Diclofenac 50 mg BID x 5 days/10 doses for L bicipital tendinitis  8/1- now also on R side- so L>R bicipital tendinitis- just got first diclofenac dose at 0830 this AM so don't know if helped. Heat helped some.   8/3: voltaren discontinued as bursitis has improved.  4. Mood:patient appears up beat and motivated -team to provide ego support as needed -antipsychotic agents: n/a 5. Neuropsych: This patientiscapable of  making decisions on herown behalf. 6. Skin/Wound Care: -Stage II sacral wound- healing well.   -Dry skin: ordered eucerin 7. Fluids/Electrolytes/Nutrition:encourage PO 8. Oropharyngeal dysphagia:  D3 nectar thick liquids and no straws  7/27- BUN 13 and Cr 0.39- not dry currently since on nectar thick liquids  7/28- MBS today or tomorrow  7/29- passed MBSS- is regular diet and thin liquids as of now.   8/2: tolerating regular diet well. 9. 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 Elevated LFT's -U/S 7/12 with sludge, borderline hepatic steatosis -f/u U/S in one year 12. Neurogenic bladder Improved, voiding well 13. Hx of psoriasis -holding enbrel as there is concern that this might have been a contributing factor to her syndrome 14. Recent seizures- resolved -secondary to hyponatremia  15. Bowel incontinence/constipation  7/26- improved since off TFs- having gas- not painful- doesn't want gas-x- con't regimen  7/30- passing a lot of gas- can't tell if stool or gas- said this can occur  Continue to monitor 16.  Leukocytosis  WBCs 10.7 on 7/19, labs ordered for tomorrow  7/26- labs not back yet  7/27- WBC down to 6.8 17.  Acute  blood loss anemia  Hemoglobin 11.1 on 7/19, labs ordered for tomorrow  7/26- labs not back yet  7/27- Hb 11.1- doing better 18. Hypokalemia  7/27- K+ 3.4- will replete with KCL 40 mEqx1 and recheck in AM  7/28-  K+ 3.5- give another 2 doses KCl 40 mEq x2 to bring up to 4.0.  7/30- will recheck labs this weekend   7/31- K+ 4.1- no additional repltion needed right now 19. OSA  7/27- wore CPAP at home- will order auto-titration device until family brings hers in.   8/1- sleeping better 20. L>R  bicipital tendinitis  7/31- will try Diclofenac 50 mg BID x 10 doses/5 days and see if it helps.   8/1- wasn't given til 0800 for first time due to not being on floor- heat did help pain somewhat prior to diclofenac-    LOS: 16 days A FACE TO FACE EVALUATION WAS [place] 05/04/2020, 9:04 AM

## 2020-05-04 NOTE — Progress Notes (Signed)
Physical Therapy Discharge Summary  Patient Details  Name: Alison Huffman MRN: [id number] Date of Birth: 04/22/1941  Today's Date: 05/04/2020 PT Individual Time: 1445-1530 PT Individual Time Calculation (min): 45 min    Patient has met 9 of 9 long term goals due to improved activity tolerance, improved balance, improved postural control, increased strength and ability to compensate for deficits.  Patient to discharge at an ambulatory level Min Assist.   Patient's care partner unavailable to provide the necessary physical assistance at discharge, therefore patient will d/c to SNF to reach a greater level of independence prior to d/c home.  Reasons goals not met: Patient has met all rehab goals.  Recommendation:  Patient will benefit from ongoing skilled PT services in skilled nursing facility setting to continue to advance safe functional mobility, address ongoing impairments in endurance, strength, balance, safety, independence with functional mobility, and minimize fall risk.  Equipment: No equipment provided. Equipment TBD by accepting facility.  Reasons for discharge: treatment goals met and discharge from hospital  Patient/family agrees with progress made and goals achieved: Yes   Skilled Intervention: Pt received supine in bed asleep, arousable and agreeable to PT session. No complaints of pain. Bed mobility at mod I level with use of bedrail. Sit to stand with CGA to RW throughout session. Toilet transfer with CGA and RW, independent for clothing management and pericare. Car transfer with CGA and use of RW, v/c for safe transfer technique. Pt is able to retrieve objects from the floor with use of RW, reacher, and CGA for balance. Reviewed safe use of reacher to retrieve objects from the floor. Per pt report she has a reacher at home. Nustep level 3 x 5 min with use of B UE/LE for global endurance training. Ambulation x 75 ft with RW and CGA before onset of fatigue. Pt requests to return  to bed at end of session. Mod I for bed mobility with use of bedrail. Pt left supine in bed with needs in reach, bed alarm in place at end of session.  PT Discharge Precautions/Restrictions Precautions Precautions: Fall Precaution Comments: hx of pacemaker Restrictions Weight Bearing Restrictions: No Pain Pain Assessment Pain Scale: 0-10 Pain Score: 0-No pain Vision/Perception  Perception Perception: Within Functional Limits Praxis Praxis: Intact  Cognition Overall Cognitive Status: Within Functional Limits for tasks assessed Arousal/Alertness: Awake/alert Orientation Level: Oriented X4 Attention: Focused;Sustained Focused Attention: Appears intact Sustained Attention: Appears intact Memory: Appears intact Awareness: Appears intact Problem Solving: Appears intact Organizing: Appears intact Safety/Judgment: Appears intact Sensation Sensation Light Touch: Impaired by gross assessment Peripheral sensation comments: reports tingling in BLE & B hands Light Touch Impaired Details: Impaired RLE;Impaired LLE (distal > proximal) Hot/Cold: Appears Intact Proprioception: Appears Intact Coordination Gross Motor Movements are Fluid and Coordinated: Yes Fine Motor Movements are Fluid and Coordinated: Yes Motor  Motor Motor: Within Functional Limits Motor - Skilled Clinical Observations: generalized weakness Motor - Discharge Observations: ongoing generalized weakness, improved since eval  Mobility Bed Mobility Bed Mobility: Rolling Right;Rolling Left;Supine to Sit;Sit to Supine Rolling Right: Independent with assistive device Rolling Left: Independent with assistive device Supine to Sit: Independent with assistive device Sit to Supine: Independent with assistive device Transfers Transfers: Sit to Stand;Stand Pivot Transfers Sit to Stand: Contact Guard/Touching assist Stand Pivot Transfers: Contact Guard/Touching assist Transfer (Assistive device): Rolling walker Locomotion   Gait Ambulation: Yes Gait Assistance: Contact Guard/Touching assist Gait Distance (Feet): 129 Feet Assistive device: Rolling walker Gait Gait: Yes Gait Pattern: Within Functional Limits Gait Pattern: Trunk   flexed Gait velocity: decreased Stairs / Additional Locomotion Stairs: Yes Stairs Assistance: Minimal Assistance - Patient > 75% Stair Management Technique: Two rails Number of Stairs: 1 Height of Stairs: 6 Wheelchair Mobility Wheelchair Mobility: Yes Wheelchair Assistance: Supervision/Verbal cueing Wheelchair Propulsion: Both upper extremities Wheelchair Parts Management: Needs assistance Distance: 150  Trunk/Postural Assessment  Cervical Assessment Cervical Assessment: Within Functional Limits Thoracic Assessment Thoracic Assessment: Within Functional Limits Lumbar Assessment Lumbar Assessment: Within Functional Limits Postural Control Postural Control: Deficits on evaluation Righting Reactions: impaired, delayed Protective Responses: impaired, delayed  Balance Balance Balance Assessed: Yes Static Sitting Balance Static Sitting - Balance Support: No upper extremity supported;Feet supported Static Sitting - Level of Assistance: 6: Modified independent (Device/Increase time) Dynamic Sitting Balance Dynamic Sitting - Balance Support: No upper extremity supported;Feet supported;During functional activity Dynamic Sitting - Level of Assistance: 5: Stand by assistance Static Standing Balance Static Standing - Balance Support: Bilateral upper extremity supported;During functional activity Static Standing - Level of Assistance: 5: Stand by assistance Dynamic Standing Balance Dynamic Standing - Balance Support: Bilateral upper extremity supported;During functional activity Dynamic Standing - Level of Assistance: 5: Stand by assistance;4: Min assist Extremity Assessment  RUE Assessment RUE Assessment: Within Functional Limits LUE Assessment LUE Assessment: Within  Functional Limits RLE Assessment RLE Assessment: Within Functional Limits General Strength Comments: 3+ to 4/5 grossly LLE Assessment LLE Assessment: Within Functional Limits General Strength Comments: 3+ to 4/5 grossly     Taylor Turkalo, PT, DPT 05/04/2020, 3:42 PM  

## 2020-05-04 NOTE — Progress Notes (Signed)
Patient had placed herself on CPAP for the night.

## 2020-05-04 NOTE — Progress Notes (Signed)
Speech Language Pathology Discharge Summary  Patient Details  Name: Alison Huffman MRN: [id number] Date of Birth: 09/30/1941  Today's Date: 05/04/2020 SLP Individual Time: 1015-1100 SLP Individual Time Calculation (min): 45 min   Skilled Therapeutic Interventions:  Patient seen to address speech goals with focus on educating patient on use of RMST devices, performing vocal exercises and reviewing dysphagia and swallow safety precautions. Patient able to return demonstrate all previously learned exercises with SLP providing verbal and demonstration cues as needed. SLP provided patient with home exercise hand outs and encouraged patient to discuss further with SLP at SNF.     Patient has met 5 of 5 long term goals.  Patient to discharge at overall Modified Independent;Supervision level.  Reasons goals not met: N/A   Clinical Impression/Discharge Summary:   Patient met 5/5 long term goals in areas of cognition, speech and swallowing. She is being discharged on Regular solids, thin liquids diet with flash penetration but no aspiration noted on recent MBS on 7/28. She is able to demonstrate recall of new information and recent events and perform complex functional tasks at supervision assist level. Patient has improved with her vocal quality but continues with dysphonia with associated hoarse and harsh voice s/p 8 days intubation during this hospital stay. She may benefit from ENT consult if voice does not continue to improve. Care Partner:  Caregiver Able to Provide Assistance: Yes (SNF)  Type of Caregiver Assistance: Cognitive  Recommendation:  24 hour supervision/assistance;Skilled Nursing facility  Rationale for SLP Follow Up: Maximize functional communication;Maximize swallowing safety;Maximize cognitive function and independence   Equipment: RMST devices   Reasons for discharge: Discharged from hospital;Treatment goals met   Patient/Family Agrees with Progress Made and Goals Achieved:  Yes    Sonia Baller, MA, CCC-SLP Speech Therapy

## 2020-05-04 NOTE — Progress Notes (Signed)
Occupational Therapy Session Note  Patient Details  Name: Alison Huffman MRN: [id number] Date of Birth: 03-29-1941  Today's Date: 05/04/2020 OT Individual Time: 9163-8466 OT Individual Time Calculation (min): 40 min    Short Term Goals: Week 2:  OT Short Term Goal 1 (Week 2): Pt will complete toilet transfer with max A +2 OT Short Term Goal 2 (Week 2): Pt will perform sit<>stand with max A to facilitate LB clothing management OT Short Term Goal 3 (Week 2): Pt will complete LB dressing tasks with mod A OT Short Term Goal 4 (Week 2): Pt will complete toileting tasks with mod A  Skilled Therapeutic Interventions/Progress Updates:    OT intervention with focus on functional amb with RW, dynamic standing balance, toilet tranfsers, and toileting. Pt amb with RW to ADL apartment and resterd on sofa before continuing to gym.  Pt engaged in standing acticity tossing small ball against rebounder (10 X 3) with rest breaks.  Pt progressed to tossing small ball against rebounder while standing on AirEx pad (10X3). Pt commented that the latter activity fatigued her legs but felt she was able to walk back to room. Pt required two rest breaks while returning to room.  Pt requested to use toilet and amb with RW into bathroom.  Pt completed toileting tasks with CGA. Pt returned to bed.  Pt remained in bed with all needs within reach and bed alarm activated. Pt pleased with progress and ready for discharge tomorrow.   Therapy Documentation Precautions:  Precautions Precautions: Fall Precaution Comments: gross limb ataxia, hx of pacemaker, on supplemental oxygen Restrictions Weight Bearing Restrictions: No  Pain:  Pt denies pain this afternoon.   Therapy/Group: Individual Therapy  Leroy Libman 05/04/2020, 2:35 PM

## 2020-05-05 ENCOUNTER — Inpatient Hospital Stay (HOSPITAL_COMMUNITY): Payer: Medicare Other

## 2020-05-05 DIAGNOSIS — Z20828 Contact with and (suspected) exposure to other viral communicable diseases: Secondary | ICD-10-CM | POA: Diagnosis not present

## 2020-05-05 DIAGNOSIS — J3801 Paralysis of vocal cords and larynx, unilateral: Secondary | ICD-10-CM | POA: Diagnosis not present

## 2020-05-05 DIAGNOSIS — Z950 Presence of cardiac pacemaker: Secondary | ICD-10-CM | POA: Diagnosis not present

## 2020-05-05 DIAGNOSIS — M6281 Muscle weakness (generalized): Secondary | ICD-10-CM | POA: Diagnosis not present

## 2020-05-05 DIAGNOSIS — R269 Unspecified abnormalities of gait and mobility: Secondary | ICD-10-CM | POA: Diagnosis not present

## 2020-05-05 DIAGNOSIS — G610 Guillain-Barre syndrome: Secondary | ICD-10-CM | POA: Diagnosis not present

## 2020-05-05 DIAGNOSIS — J479 Bronchiectasis, uncomplicated: Secondary | ICD-10-CM | POA: Diagnosis not present

## 2020-05-05 DIAGNOSIS — E781 Pure hyperglyceridemia: Secondary | ICD-10-CM | POA: Diagnosis not present

## 2020-05-05 DIAGNOSIS — M810 Age-related osteoporosis without current pathological fracture: Secondary | ICD-10-CM | POA: Diagnosis not present

## 2020-05-05 DIAGNOSIS — K5900 Constipation, unspecified: Secondary | ICD-10-CM | POA: Diagnosis not present

## 2020-05-05 DIAGNOSIS — I700 Atherosclerosis of aorta: Secondary | ICD-10-CM | POA: Diagnosis not present

## 2020-05-05 DIAGNOSIS — I2510 Atherosclerotic heart disease of native coronary artery without angina pectoris: Secondary | ICD-10-CM | POA: Diagnosis not present

## 2020-05-05 DIAGNOSIS — I517 Cardiomegaly: Secondary | ICD-10-CM | POA: Diagnosis not present

## 2020-05-05 DIAGNOSIS — K219 Gastro-esophageal reflux disease without esophagitis: Secondary | ICD-10-CM | POA: Diagnosis not present

## 2020-05-05 DIAGNOSIS — R279 Unspecified lack of coordination: Secondary | ICD-10-CM | POA: Diagnosis not present

## 2020-05-05 DIAGNOSIS — J3800 Paralysis of vocal cords and larynx, unspecified: Secondary | ICD-10-CM | POA: Diagnosis not present

## 2020-05-05 DIAGNOSIS — E871 Hypo-osmolality and hyponatremia: Secondary | ICD-10-CM | POA: Diagnosis not present

## 2020-05-05 DIAGNOSIS — R1312 Dysphagia, oropharyngeal phase: Secondary | ICD-10-CM | POA: Diagnosis not present

## 2020-05-05 DIAGNOSIS — R2689 Other abnormalities of gait and mobility: Secondary | ICD-10-CM | POA: Diagnosis not present

## 2020-05-05 DIAGNOSIS — K2100 Gastro-esophageal reflux disease with esophagitis, without bleeding: Secondary | ICD-10-CM | POA: Diagnosis not present

## 2020-05-05 DIAGNOSIS — Z79899 Other long term (current) drug therapy: Secondary | ICD-10-CM | POA: Diagnosis not present

## 2020-05-05 DIAGNOSIS — Z9989 Dependence on other enabling machines and devices: Secondary | ICD-10-CM | POA: Diagnosis not present

## 2020-05-05 DIAGNOSIS — G4733 Obstructive sleep apnea (adult) (pediatric): Secondary | ICD-10-CM | POA: Diagnosis not present

## 2020-05-05 DIAGNOSIS — L4052 Psoriatic arthritis mutilans: Secondary | ICD-10-CM | POA: Diagnosis not present

## 2020-05-05 LAB — GLUCOSE, CAPILLARY: Glucose-Capillary: 96 mg/dL (ref 70–99)

## 2020-05-05 NOTE — Progress Notes (Signed)
Recreational Therapy Discharge Summary Patient Details  Name: Alison Huffman MRN: [id number] Date of Birth: 09/01/1941 Today's Date: 05/05/2020  Long term goals set: 1  Long term goals met: 1  Comments on progress toward goals: Pt has made great progress during LOS and is ready for discharge to SNF for continued therapies and 24 hour supervision/assistance.  TR sessions focused on activity analysis identifying modifications, leisure participation and its effect on physical, emotional, cognitive, social and spiritual wellness and relaxation strategies.  Pt requires supervision/set up assist for seated tasks and min assist for standing activities.  Goal met.  Reasons for discharge: discharge from hospital  Follow-up: Encourage participation in activities program as made available  Patient/family agrees with progress made and goals achieved: Yes  Alison Huffman 05/05/2020, 1:20 PM

## 2020-05-05 NOTE — Progress Notes (Signed)
Patient ID: Alison Huffman, female   DOB: March 31, 1941, 79 y.o.   MRN: [id number]  SW spoke with Terri with [facility] [phone]) and pt going to Rm#[id number]; Nurse report to [phone]. Pt will need to be downstairs for pick up at 1:45pm; driver BJ will arrive by 2pm. SW informed pt assigned RN on above.   Loralee Pacas, MSW, [place] Office: [phone] Cell: [phone] Fax: [phone]

## 2020-05-05 NOTE — Progress Notes (Signed)
[facility] PHYSICAL MEDICINE & REHABILITATION PROGRESS NOTE   Subjective/Complaints: No complaints this morning.  Feeling ready for discharge home!  ROS:  Pt denies SOB, abd pain, CP, N/V/C/D, and vision changes  Objective:   No results found. Recent Labs    05/03/20 0513  WBC 5.4  HGB 10.6*  HCT 33.0*  PLT 194   Recent Labs    05/03/20 0513  NA 140  K 4.1  CL 106  CO2 27  GLUCOSE 94  BUN 10  CREATININE 0.50  CALCIUM 8.9    Intake/Output Summary (Last 24 hours) at 05/05/2020 1047 Last data filed at 05/05/2020 0745 Gross per 24 hour  Intake 956 ml  Output --  Net 956 ml     Physical Exam: Vital Signs Blood pressure (!) 134/57, pulse 82, temperature 98.1 F (36.7 C), resp. rate 14, height 5\' 6"  (1.676 m), weight 68.3 kg, SpO2 96 %. General: Alert and oriented x 3, No apparent distress HEENT: Head is normocephalic, atraumatic, PERRLA, EOMI, sclera anicteric, oral mucosa pink and moist, dentition intact, ext ear canals clear,  Neck: Supple without JVD or lymphadenopathy Heart: Reg rate and rhythm. No murmurs rubs or gallops Chest: CTA bilaterally without wheezes, rales, or rhonchi; no distress Abdomen: Soft, non-tender, non-distended, bowel sounds positive. Extremities: No clubbing, cyanosis, or edema. Pulses are 2+ Skin: dry, intact, Musc: No edema in extremities.  L anterior biceps tendon origin sore/TTP- more with lifting arm/biceps activation- still there- slightly notable on R anterior shoulder as well.  Neuro: more engaged. - no change Dysphonia, much improved in last 24 hours Motor: Bilateral upper extremities: 4+ in UEs and 4 to 4+ in LEs- improved- checked biceps, triceps, grip and finger abd; also  KE, DF and PF  Assessment/Plan: 1. 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  Physiatrist is providing close team supervision and 24 hour  management of active medical problems listed below.  Physiatrist and rehab team continue to assess barriers to discharge/monitor patient progress toward functional and medical goals  Care Tool:  Bathing    Body parts bathed by patient: Right arm, Left arm, Chest, Abdomen, Front perineal area, Right upper leg, Left upper leg, Face, Buttocks, Right lower leg, Left lower leg   Body parts bathed by helper: Right lower leg, Left lower leg     Bathing assist Assist Level: Supervision/Verbal cueing     Upper Body Dressing/Undressing Upper body dressing   What is the patient wearing?: Pull over shirt    Upper body assist Assist Level: Independent    Lower Body Dressing/Undressing Lower body dressing      What is the patient wearing?: Incontinence brief, Pants     Lower body assist Assist for lower body dressing: Minimal Assistance - Patient > 75%     Toileting Toileting    Toileting assist Assist for toileting: Supervision/Verbal cueing     Transfers Chair/bed transfer  Transfers assist  Chair/bed transfer activity did not occur: Safety/medical concerns  Chair/bed transfer assist level: Contact Guard/Touching assist Chair/bed transfer assistive device: Programmer, multimedia   Ambulation assist   Ambulation activity did not occur: Safety/medical concerns  Assist level: Contact Guard/Touching assist Assistive device: Walker-rolling Max distance: 129'   Walk 10 feet activity   Assist  Walk 10 feet activity did not occur: Safety/medical concerns  Assist level: Contact Guard/Touching assist Assistive device: Walker-rolling   Walk 50  feet activity   Assist Walk 50 feet with 2 turns activity did not occur: Safety/medical concerns (did not perform turns)  Assist level: Contact Guard/Touching assist Assistive device: Walker-rolling    Walk 150 feet activity   Assist Walk 150 feet activity did not occur: Safety/medical concerns         Walk  10 feet on uneven surface  activity   Assist Walk 10 feet on uneven surfaces activity did not occur: Safety/medical concerns         Wheelchair     Assist Will patient use wheelchair at discharge?: No Type of Wheelchair: Manual Wheelchair activity did not occur: Safety/medical concerns  Wheelchair assist level: Supervision/Verbal cueing Max wheelchair distance: 150'    Wheelchair 50 feet with 2 turns activity    Assist    Wheelchair 50 feet with 2 turns activity did not occur: Safety/medical concerns   Assist Level: Supervision/Verbal cueing   Wheelchair 150 feet activity     Assist  Wheelchair 150 feet activity did not occur: Safety/medical concerns   Assist Level: Supervision/Verbal cueing   Blood pressure (!) 134/57, pulse 82, temperature 98.1 F (36.7 C), resp. rate 14, height 5\' 6"  (1.676 m), weight 68.3 kg, SpO2 96 %.  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 Antithrombotics: -DVT/anticoagulation:Pharmaceutical:Lovenox -antiplatelet therapy: n/a 3. Pain Management:tylenol prn  7/29- no change- pain 2-3/10- no meds per pt request  7/30- discussed nerve pain again- says it's intermittent and doesn't want meds- con't tylenol- did ask how long it last sand if will go away- explained 90% it does, but can take 1 year.  7/31- Will try Diclofenac 50 mg BID x 5 days/10 doses for L bicipital tendinitis  8/1- now also on R side- so L>R bicipital tendinitis- just got first diclofenac dose at 0830 this AM so don't know if helped. Heat helped some.   8/3: voltaren discontinued as bursitis has improved.  8/4 well controlled 4. Mood:patient appears up beat and motivated -team to provide ego support as needed -antipsychotic agents: n/a 5. Neuropsych: This patientiscapable of making decisions on herown behalf. 6. Skin/Wound  Care: -Stage II sacral wound- healing well  -Dry skin: ordered eucerin 7. Fluids/Electrolytes/Nutrition:encourage PO 8. Oropharyngeal dysphagia:  D3 nectar thick liquids and no straws  7/27- BUN 13 and Cr 0.39- not dry currently since on nectar thick liquids  7/28- MBS today or tomorrow  7/29- passed MBSS- is regular diet and thin liquids as of now.   8/2-8/4: tolerating regular diet well 9. 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 Elevated LFT's -U/S 7/12 with sludge, borderline hepatic steatosis -f/u U/S in one year 12. Neurogenic bladder Improved, voiding well 13. Hx of psoriasis -holding enbrel as there is concern that this might have been a contributing factor to her syndrome 14. Recent seizures- resolved -secondary to hyponatremia  15. Bowel incontinence/constipation  7/26- improved since off TFs- having gas- not painful- doesn't want gas-x- con't regimen  7/30- passing a lot of gas- can't tell if stool or gas- said this can occur  Continue to monitor 16.  Leukocytosis  WBCs 10.7 on 7/19, labs ordered for tomorrow  7/26- labs not back yet  7/27- WBC down to 6.8 17.  Acute blood loss anemia  Hemoglobin 11.1 on 7/19, labs ordered for tomorrow  7/26- labs not back yet  7/27- Hb 11.1- doing better 18. Hypokalemia  7/27- K+ 3.4- will replete with KCL 40 mEqx1 and recheck in AM  7/28-  K+ 3.5- give another 2 doses KCl 40 mEq x2 to bring up to 4.0.  7/30- will recheck labs this weekend   7/31- K+ 4.1- no additional repltion needed right now 19. OSA  7/27- wore CPAP at home- will order auto-titration device until family brings hers in.   8/1- sleeping better 20. L>R bicipital tendinitis  7/31- will try Diclofenac 50 mg  BID x 10 doses/5 days and see if it helps.   8/1- wasn't given til 0800 for first time due to not being on floor- heat did help pain somewhat prior to diclofenac-    >30 minutes spent in discharge of patient including review of medications and follow-up appointments, physical examination, discussion of pain, eating, and in answering all patient's questions   LOS: 17 days A FACE TO FACE EVALUATION WAS PERFORMED  Martha Clan P Sally-Ann Cutbirth 05/05/2020, 10:47 AM

## 2020-05-05 NOTE — Progress Notes (Signed)
Inpatient Rehabilitation Care Coordinator  Discharge Note  The overall goal for the admission was met for:   Discharge location: Yes. D/c to [facility] Rm#[id number]; Nurse report to [phone].  Length of Stay: Yes. 17 days.  Discharge activity level: Yes. Min A  Home/community participation: Yes. Limited  Services provided included: MD, RD, PT, OT, SLP, RN, CM, TR, Pharmacy, Neuropsych and SW  Financial Services: Medicare  Follow-up services arranged: Other: N./A  Comments (or additional information): contact pt niece [name] [phone]  Patient/Family verbalized understanding of follow-up arrangements: Yes  Individual responsible for coordination of the follow-up plan: pt to have assistance with coordinating care needs.   Confirmed correct DME delivered: Rana Snare 05/05/2020    Rana Snare

## 2020-05-05 NOTE — Discharge Instructions (Signed)
Inpatient Rehab Discharge Instructions  Alison Huffman Discharge date and time:  05/05/20  Activities/Precautions/ Functional Status: Activity: no lifting, driving, or strenuous exercise till cleared by MD Diet:  Wound Care: Keep clean and dry.    Functional status:  ___ No restrictions     ___ Walk up steps independently _X__ 24/7 supervision/assistance   ___ Walk up steps with assistance ___ Intermittent supervision/assistance  ___ Bathe/dress independently ___ Walk with walker     _X__ Bathe/dress with assistance ___ Walk Independently    ___ Shower independently ___ Walk with assistance    ___ Shower with assistance _X__ No alcohol     ___ Return to work/school ________  Special Instructions:    My questions have been answered and I understand these instructions. I will adhere to these goals and the provided educational materials after my discharge from the hospital.  Patient/Caregiver Signature _______________________________ Date __________  Clinician Signature _______________________________________ Date __________  Please bring this form and your medication list with you to all your follow-up doctor's appointments.

## 2020-05-05 NOTE — Progress Notes (Signed)
This nurse called report to [place] at [facility]. Pt is up and ready for d/c. This nurse assured pt's needs were met. Pt's belongings were collected, and she is awaiting transport.

## 2020-05-06 DIAGNOSIS — E781 Pure hyperglyceridemia: Secondary | ICD-10-CM | POA: Diagnosis not present

## 2020-05-06 DIAGNOSIS — G610 Guillain-Barre syndrome: Secondary | ICD-10-CM | POA: Diagnosis not present

## 2020-05-06 DIAGNOSIS — G4733 Obstructive sleep apnea (adult) (pediatric): Secondary | ICD-10-CM | POA: Diagnosis not present

## 2020-05-06 DIAGNOSIS — E871 Hypo-osmolality and hyponatremia: Secondary | ICD-10-CM | POA: Insufficient documentation

## 2020-05-06 DIAGNOSIS — Z9989 Dependence on other enabling machines and devices: Secondary | ICD-10-CM | POA: Diagnosis not present

## 2020-05-07 ENCOUNTER — Telehealth: Payer: Self-pay

## 2020-05-07 NOTE — Telephone Encounter (Signed)
Transitional Care call--Wendy    1. Are you/is patient experiencing any problems since coming home? No Are there any questions regarding any aspect of care? No 2. Are there any questions regarding medications administration/dosing? No Are meds being taken as prescribed? Yes Patient should review meds with caller to confirm 3. Have there been any falls? No 4. Has Home Health been to the house and/or have they contacted you? At SNF If not, have you tried to contact them? Can we help you contact them? 5. Are bowels and bladder emptying properly? Yes Are there any unexpected incontinence issues? No If applicable, is patient following bowel/bladder programs? 6. Any fevers, problems with breathing, unexpected pain? No 7. Are there any skin problems or new areas of breakdown? No 8. Has the patient/family member arranged specialty MD follow up (ie cardiology/neurology/renal/surgical/etc)? Yes  Can we help arrange? 9. Does the patient need any other services or support that we can help arrange? No 10. Are caregivers following through as expected in assisting the patient? Yes at SNF 11. Has the patient quit smoking, drinking alcohol, or using drugs as recommended? Yes  Appointment time 1:00 pm, arrive time 12:40 pm with Dr. Dagoberto Ligas [address]

## 2020-05-07 NOTE — Telephone Encounter (Signed)
[name] from [facility] wanting to know if it would be best if the pt monitor be with her while she is in rehab? I told her it is best for the pt to be near the pt. It is the only way we can monitor her pacemaker remotely.

## 2020-05-10 ENCOUNTER — Encounter: Payer: Self-pay | Admitting: Neurology

## 2020-05-14 ENCOUNTER — Encounter: Payer: TRICARE For Life (TFL) | Admitting: Obstetrics and Gynecology

## 2020-05-19 ENCOUNTER — Other Ambulatory Visit: Payer: Medicare Other

## 2020-05-24 ENCOUNTER — Other Ambulatory Visit: Payer: Medicare Other

## 2020-05-25 ENCOUNTER — Other Ambulatory Visit: Payer: Self-pay | Admitting: Physician Assistant

## 2020-05-25 DIAGNOSIS — K219 Gastro-esophageal reflux disease without esophagitis: Secondary | ICD-10-CM | POA: Diagnosis not present

## 2020-05-25 DIAGNOSIS — J3800 Paralysis of vocal cords and larynx, unspecified: Secondary | ICD-10-CM

## 2020-05-25 DIAGNOSIS — J3801 Paralysis of vocal cords and larynx, unilateral: Secondary | ICD-10-CM

## 2020-05-26 ENCOUNTER — Inpatient Hospital Stay: Admit: 2020-05-26 | Payer: Medicare Other | Admitting: Obstetrics and Gynecology

## 2020-05-26 SURGERY — HYSTERECTOMY, TOTAL, ROBOT-ASSISTED, LAPAROSCOPIC, WITH BILATERAL SALPINGO-OOPHORECTOMY
Anesthesia: Choice

## 2020-05-28 ENCOUNTER — Other Ambulatory Visit: Payer: Self-pay

## 2020-05-28 ENCOUNTER — Encounter: Payer: Self-pay | Admitting: Physical Medicine and Rehabilitation

## 2020-05-28 ENCOUNTER — Encounter
Payer: No Typology Code available for payment source | Attending: Physical Medicine and Rehabilitation | Admitting: Physical Medicine and Rehabilitation

## 2020-05-28 VITALS — BP 131/73 | HR 89 | Temp 98.4°F | Ht 66.0 in | Wt 168.0 lb

## 2020-05-28 DIAGNOSIS — G610 Guillain-Barre syndrome: Secondary | ICD-10-CM | POA: Insufficient documentation

## 2020-05-28 DIAGNOSIS — M6281 Muscle weakness (generalized): Secondary | ICD-10-CM | POA: Diagnosis not present

## 2020-05-28 DIAGNOSIS — R269 Unspecified abnormalities of gait and mobility: Secondary | ICD-10-CM | POA: Diagnosis not present

## 2020-05-28 NOTE — Patient Instructions (Signed)
Patient is an 79 yr old female with Guillain Barre syndrome- hx of OSA, dCHF, chest tubes due to B/L pneumothorax due to Code blue/being bagged, seizure, hyper and hyponatremia- acute; S/P IVIG and plasmapharesis.  As well as R vocal cord paralysis. Now eating regular diet! And dysphagia has resolved.    1. No COVID or FLU vaccine- due to Guillain Barre syndrome.  tentanus- can get- try to avoid vaccines overall.   2. Needs f/u Liver U/S in 1 year due to sludge and borderline hepatic steatosis.   3. Asking about possible CHF exacerbation. Reviewed chart back to 04/09/20- every note- cannot find any indication of CHF exacerbation- only that she had NO exacerbation- couldn't find Cards consult either.   4. Keep Neurology f/u.   5. SNF will order H/H therapies. Call me to order outpatient therapies when due.   6. Don't treat nerve pain at this time.   7. F/U  In 2 months. Stage II pressure ulcer healed.

## 2020-05-28 NOTE — Progress Notes (Signed)
Subjective:    Patient ID: Alison Huffman, female    DOB: 05-25-1941, 79 y.o.   MRN: [id number]  HPI   Patient is an 79 yr old female with Guillain Barre syndrome- hx of OSA, dCHF, chest tubes due to B/L pneumothorax due to Code blue/being bagged, seizure, hyper and hyponatremia- acute; S/P IVIG and plasmapharesis.     Here for f/u.   Saw ENT for voice-last week. R paralyzed vocal cord.  Going to do a CT scan of it next week.  No injection as of yet.   Weakness going well- from GBS.  L leg used to be stronger, and now RLE is stronger- in the last week.   Passing L leg by- like tortoise and hare.  Is back to walker- using RW- walked 7 minutes this AM.  Usually max 5 minutes at a time.   Can feel at level pain around abdomen- barely - had for at least 1 year- With Humira????  Not as noticeable. Doesn't want meds for it- can't stand the thought of putting on bra, though.   Having toes/feet numbness and tingling-  Getting swelling in LEs/calves and feet B/L- a couple of weeks.    If feels urge, and don't go to bathroom, can be a problem- now voiding on own.    Was in independent living prior- in SNF attached right now.     Pain Inventory Average Pain 4 Pain Right Now 0 My pain is intermittent, sharp and tingling  LOCATION OF PAIN  Hand, fingers, shoulders, ankles and toes  BOWEL Number of stools per week: 5-6 Oral laxative use Yes  Type of laxative colace Enema or suppository use No  History of colostomy No  Incontinent No   BLADDER Normal In and out cath, frequency na Able to self cath No  Bladder incontinence No  Frequent urination No  Leakage with coughing No  Difficulty starting stream No  Incomplete bladder emptying No    Mobility use a walker how many minutes can you walk? 5-7 ability to climb steps?  no do you drive?  no use a wheelchair transfers alone  Function retired I need assistance with the following:  household duties and  shopping  Neuro/Psych weakness numbness tremor tingling trouble walking  Prior Studies HFU  Physicians involved in your care HFU   Family History  Problem Relation Age of Onset  . Stroke Mother   . Atrial fibrillation Mother   . Breast cancer Mother   . Stroke Father   . Breast cancer Maternal Aunt   . Breast cancer Paternal Aunt    Social History   Socioeconomic History  . Marital status: Single    Spouse name: Not on file  . Number of children: Not on file  . Years of education: Not on file  . Highest education level: Not on file  Occupational History  . Not on file  Tobacco Use  . Smoking status: Never Smoker  . Smokeless tobacco: Never Used  . Tobacco comment: 07/22/2018 "smoked some in college; 1960s;  nothing since"  Vaping Use  . Vaping Use: Never used  Substance and Sexual Activity  . Alcohol use: Yes    Comment: rarely  . Drug use: Never  . Sexual activity: Not Currently    Birth control/protection: Post-menopausal  Other Topics Concern  . Not on file  Social History Narrative  . Not on file   Social Determinants of Health   Financial Resource Strain:   .  Difficulty of Paying Living Expenses: Not on file  Food Insecurity:   . Worried About Charity fundraiser in the Last Year: Not on file  . Ran Out of Food in the Last Year: Not on file  Transportation Needs:   . Lack of Transportation (Medical): Not on file  . Lack of Transportation (Non-Medical): Not on file  Physical Activity:   . Days of Exercise per Week: Not on file  . Minutes of Exercise per Session: Not on file  Stress:   . Feeling of Stress : Not on file  Social Connections:   . Frequency of Communication with Friends and Family: Not on file  . Frequency of Social Gatherings with Friends and Family: Not on file  . Attends Religious Services: Not on file  . Active Member of Clubs or Organizations: Not on file  . Attends Archivist Meetings: Not on file  . Marital  Status: Not on file   Past Surgical History:  Procedure Laterality Date  . APPENDECTOMY    . BREAST CYST ASPIRATION Bilateral   . BREAST CYST EXCISION Right 1978   benign  . COLONOSCOPY    . COLONOSCOPY WITH PROPOFOL N/A 07/10/2019   Procedure: COLONOSCOPY WITH PROPOFOL;  Surgeon: Lollie Sails, MD;  Location: [facility] ENDOSCOPY;  Service: Endoscopy;  Laterality: N/A;  . ESOPHAGOGASTRODUODENOSCOPY (EGD) WITH PROPOFOL N/A 07/10/2019   Procedure: ESOPHAGOGASTRODUODENOSCOPY (EGD) WITH PROPOFOL;  Surgeon: Lollie Sails, MD;  Location: [facility] ENDOSCOPY;  Service: Endoscopy;  Laterality: N/A;  . HYSTEROSCOPY WITH D & C N/A 11/05/2018   Procedure: DILATATION AND CURETTAGE /HYSTEROSCOPY;  Surgeon: Homero Fellers, MD;  Location: [facility] ORS;  Service: Gynecology;  Laterality: N/A;  . INSERT / REPLACE / REMOVE PACEMAKER    . 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 UTERINE POLYPS REMOVAL     Past Medical History:  Diagnosis Date  . Ankle pain, right   . Atypical chest pain    a. 10/2018 MV: EF 57%. No ischemia/infarct.  . Coronary artery disease   . Diastolic dysfunction    a. 04/2018 Echo: EF 55-60%, no rwma, Gr1 DD. Triv AI, mild MR. Mod dil LA. Nl RV fxn. PASP nl.  . GERD (gastroesophageal reflux disease)   . Headache    "dull one sometimes daily, at least weekly in last couple months" (07/22/2018)  . Hyperlipidemia   . Hypertension   . 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 Joint pain in fingers of right hand   . Lichen sclerosus   . Osteopenia   . Pneumonia 2012? X 1  . 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 Psoriasis   . Psoriatic arthritis ([place])    " dx'd the 1st of this year, 2019" (07/22/2018)  . Psoriatic arthritis ([place])   . Rosacea   . Seborrheic keratosis   . Second degree heart block    a. 07/2018 s/p SJM Assurity MRI model PM2271 (Ser# [id number]).  . Sleep apnea    "using nasal  pilllows since ~ 12/2017" (07/22/2018)  . TMJ (dislocation of temporomandibular joint)    BP 131/73   Pulse 89   Temp 98.4 F (36.9 C)   Ht 5\' 6"  (1.676 m)   Wt 168 lb (76.2 kg)   SpO2 95%   BMI 27.12 kg/m   Opioid Risk Score:   Fall Risk Score:  `1  Depression screen [facility] 2/9  Depression screen [facility] 2/9 05/28/2020 03/24/2020 09/22/2019 09/19/2019 09/20/2018 09/06/2018 07/29/2018  Decreased Interest 0 0 0 0 0 0 1  Down, Depressed, Hopeless 0 0 0 0 0 0 0  PHQ - 2 Score 0 0 0 0 0 0 1  Altered sleeping 1 - - - - 0 -  Tired, decreased energy 1 - - - - 1 -  Change in appetite 0 - - - - 1 -  Feeling bad or failure about yourself  0 - - - - 0 -  Trouble concentrating 0 - - - - 0 -  Moving slowly or fidgety/restless 0 - - - - 0 -  Suicidal thoughts 0 - - - - 0 -  PHQ-9 Score 2 - - - - 2 -  Difficult doing work/chores Not difficult at all - - - - Not difficult at all -    Review of Systems     Objective:   Physical Exam  Awake, alert, hoarse voice still (due to paralyzed vocal cord), NAD Deltoid, biceps and triceps are 5-/5 B/L; and WE, grip and finger abd is 4+/5 B/L HF 4-/5 on L and 4+/5 on R; KE and KF 5-/5, and DF and PF 5-/5 B/L  Neuro: UE DTRs 1+ and B/L; cannot get knee DTRs Intact to light touch in all 4 extremities B/L, but has ADDITIONAL toe tingling, which is better  1+ LE edema to mid calf B/L      Assessment & Plan:  Patient is an 79 yr old female with Guillain Barre syndrome- hx of OSA, dCHF, chest tubes due to B/L pneumothorax due to Code blue/being bagged, seizure, hyper and hyponatremia- acute; S/P IVIG and plasmapharesis.  As well as R vocal cord paralysis. Now eating regular diet! And dysphagia has resolved.    1. No COVID or FLU vaccine- due to Guillain Barre syndrome.  tentanus- can get- try to avoid vaccines overall.   2. Needs f/u Liver U/S in 1 year due to sludge and borderline hepatic steatosis.   3. Asking about possible CHF exacerbation.  Reviewed chart back to 04/09/20- every note- cannot find any indication of CHF exacerbation- only that she had NO exacerbation- couldn't find Cards consult either.   4. Keep Neurology f/u.   5. SNF will order H/H therapies. Call me to order outpatient therapies when due.   6. Don't treat nerve pain at this time.   7. F/U  In 2 months. Stage II pressure ulcer healed.    I spent a total of 35 minutes on visit- mainly going over plan and reviewing char tin room with pt.

## 2020-06-03 ENCOUNTER — Other Ambulatory Visit: Payer: Self-pay | Admitting: Family Medicine

## 2020-06-03 DIAGNOSIS — Z1231 Encounter for screening mammogram for malignant neoplasm of breast: Secondary | ICD-10-CM

## 2020-06-04 ENCOUNTER — Ambulatory Visit
Admission: RE | Admit: 2020-06-04 | Discharge: 2020-06-04 | Disposition: A | Discharge status: Home / Self Care | Payer: Medicare Other | Source: Ambulatory Visit | Attending: Physician Assistant | Admitting: Physician Assistant

## 2020-06-04 ENCOUNTER — Other Ambulatory Visit: Payer: Self-pay

## 2020-06-04 DIAGNOSIS — I517 Cardiomegaly: Secondary | ICD-10-CM | POA: Diagnosis not present

## 2020-06-04 DIAGNOSIS — J3801 Paralysis of vocal cords and larynx, unilateral: Secondary | ICD-10-CM | POA: Diagnosis not present

## 2020-06-04 DIAGNOSIS — I2510 Atherosclerotic heart disease of native coronary artery without angina pectoris: Secondary | ICD-10-CM | POA: Diagnosis not present

## 2020-06-04 DIAGNOSIS — I700 Atherosclerosis of aorta: Secondary | ICD-10-CM | POA: Diagnosis not present

## 2020-06-04 DIAGNOSIS — J479 Bronchiectasis, uncomplicated: Secondary | ICD-10-CM | POA: Diagnosis not present

## 2020-06-04 DIAGNOSIS — J3800 Paralysis of vocal cords and larynx, unspecified: Secondary | ICD-10-CM | POA: Diagnosis not present

## 2020-06-04 MED ORDER — IOHEXOL 300 MG/ML  SOLN
75.0000 mL | Freq: Once | INTRAMUSCULAR | Status: AC | PRN
  Administered 2020-06-04: 75 mL via INTRAVENOUS

## 2020-06-08 ENCOUNTER — Encounter
Admission: RE | Admit: 2020-06-08 | Discharge: 2020-06-08 | Disposition: A | Discharge status: Home / Self Care | Payer: Medicare Other | Source: Ambulatory Visit | Attending: Internal Medicine | Admitting: Internal Medicine

## 2020-06-10 DIAGNOSIS — M6281 Muscle weakness (generalized): Secondary | ICD-10-CM | POA: Diagnosis not present

## 2020-06-10 DIAGNOSIS — G610 Guillain-Barre syndrome: Secondary | ICD-10-CM | POA: Diagnosis not present

## 2020-06-10 DIAGNOSIS — R279 Unspecified lack of coordination: Secondary | ICD-10-CM | POA: Diagnosis not present

## 2020-06-10 DIAGNOSIS — R2681 Unsteadiness on feet: Secondary | ICD-10-CM | POA: Diagnosis not present

## 2020-06-10 DIAGNOSIS — R269 Unspecified abnormalities of gait and mobility: Secondary | ICD-10-CM | POA: Diagnosis not present

## 2020-06-10 DIAGNOSIS — R2689 Other abnormalities of gait and mobility: Secondary | ICD-10-CM | POA: Diagnosis not present

## 2020-06-10 DIAGNOSIS — R531 Weakness: Secondary | ICD-10-CM | POA: Diagnosis not present

## 2020-06-10 DIAGNOSIS — R1312 Dysphagia, oropharyngeal phase: Secondary | ICD-10-CM | POA: Diagnosis not present

## 2020-06-10 DIAGNOSIS — R490 Dysphonia: Secondary | ICD-10-CM | POA: Diagnosis not present

## 2020-06-11 DIAGNOSIS — R531 Weakness: Secondary | ICD-10-CM | POA: Diagnosis not present

## 2020-06-11 DIAGNOSIS — M6281 Muscle weakness (generalized): Secondary | ICD-10-CM | POA: Diagnosis not present

## 2020-06-11 DIAGNOSIS — G610 Guillain-Barre syndrome: Secondary | ICD-10-CM | POA: Diagnosis not present

## 2020-06-11 DIAGNOSIS — R2689 Other abnormalities of gait and mobility: Secondary | ICD-10-CM | POA: Diagnosis not present

## 2020-06-11 DIAGNOSIS — R2681 Unsteadiness on feet: Secondary | ICD-10-CM | POA: Diagnosis not present

## 2020-06-11 DIAGNOSIS — R1312 Dysphagia, oropharyngeal phase: Secondary | ICD-10-CM | POA: Diagnosis not present

## 2020-06-14 ENCOUNTER — Other Ambulatory Visit: Payer: Self-pay

## 2020-06-14 ENCOUNTER — Encounter: Payer: Self-pay | Admitting: Pulmonary Disease

## 2020-06-14 ENCOUNTER — Ambulatory Visit (INDEPENDENT_AMBULATORY_CARE_PROVIDER_SITE_OTHER): Payer: Medicare Other | Admitting: Pulmonary Disease

## 2020-06-14 VITALS — BP 130/70 | HR 99 | Ht 66.0 in | Wt 169.8 lb

## 2020-06-14 DIAGNOSIS — R918 Other nonspecific abnormal finding of lung field: Secondary | ICD-10-CM | POA: Diagnosis not present

## 2020-06-14 DIAGNOSIS — M6281 Muscle weakness (generalized): Secondary | ICD-10-CM | POA: Diagnosis not present

## 2020-06-14 DIAGNOSIS — R5381 Other malaise: Secondary | ICD-10-CM | POA: Insufficient documentation

## 2020-06-14 DIAGNOSIS — R2681 Unsteadiness on feet: Secondary | ICD-10-CM | POA: Diagnosis not present

## 2020-06-14 DIAGNOSIS — G4733 Obstructive sleep apnea (adult) (pediatric): Secondary | ICD-10-CM

## 2020-06-14 DIAGNOSIS — G610 Guillain-Barre syndrome: Secondary | ICD-10-CM

## 2020-06-14 DIAGNOSIS — L4050 Arthropathic psoriasis, unspecified: Secondary | ICD-10-CM | POA: Diagnosis not present

## 2020-06-14 DIAGNOSIS — Z0000 Encounter for general adult medical examination without abnormal findings: Secondary | ICD-10-CM | POA: Diagnosis not present

## 2020-06-14 DIAGNOSIS — R1312 Dysphagia, oropharyngeal phase: Secondary | ICD-10-CM | POA: Diagnosis not present

## 2020-06-14 DIAGNOSIS — J3800 Paralysis of vocal cords and larynx, unspecified: Secondary | ICD-10-CM | POA: Insufficient documentation

## 2020-06-14 DIAGNOSIS — J479 Bronchiectasis, uncomplicated: Secondary | ICD-10-CM | POA: Diagnosis not present

## 2020-06-14 DIAGNOSIS — R2689 Other abnormalities of gait and mobility: Secondary | ICD-10-CM | POA: Diagnosis not present

## 2020-06-14 DIAGNOSIS — R531 Weakness: Secondary | ICD-10-CM | POA: Diagnosis not present

## 2020-06-14 NOTE — Assessment & Plan Note (Signed)
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

## 2020-06-14 NOTE — Assessment & Plan Note (Signed)
Plan: Continue follow-up with [facility] rheumatology

## 2020-06-14 NOTE — Assessment & Plan Note (Signed)
Plan: Discuss with neurology whether or not they feel that you would be a candidate for the seasonal flu vaccine

## 2020-06-14 NOTE — Progress Notes (Signed)
Reviewed and agree with assessment/plan.   Chesley Mires, MD [facility] Pulmonary/Critical Care 06/14/2020, 11:55 AM Pager:  [phone]

## 2020-06-14 NOTE — Assessment & Plan Note (Signed)
Plan: Establish care with neurology as planned If breathing is worsen please let our office know so we can obtain pulmonary function testing

## 2020-06-14 NOTE — Assessment & Plan Note (Signed)
Plan: Continue CPAP therapy Follow-up in 3 months

## 2020-06-14 NOTE — Assessment & Plan Note (Signed)
Plan: Continue to work on physical deconditioning Continue to work with physical therapy Continue to increase overall daily physical exercise

## 2020-06-14 NOTE — Progress Notes (Signed)
@Patient  ID: Alison Huffman, female    DOB: Jan 09, 1941, 79 y.o.   MRN: [id number]  Chief Complaint  Patient presents with  . Follow-up    last day of rehab.  Voice is better.  started cpap in July.  no problems with cpap.    Referring provider: Leone Haven, MD  HPI:  79 year old female never smoker followed in our office for obstructive sleep apnea  PMH: Tennis Must, GERD, psoriatic arthritis, hypertension Smoker/ Smoking History: Never smoker Maintenance: None Pt of: Former patient of DR  06/14/2020  - Visit   79 year old female never smoker followed in our office by DR.  She is followed in our office for obstructive sleep apnea.  Completing 1 year follow-up with our office today.  Patient was being discharged from rehab today she started her CPAP therapy in July/2021 she currently has no issues with it.  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 Ankle pain, right  . Atypical chest pain   a. 10/2018 MV: EF 57%. No ischemia/infarct. . Coronary artery disease  . Diastolic dysfunction   a. 04/2018 Echo: EF 55-60%, no rwma, Gr1 DD. Triv AI, mild MR. Mod dil LA. Nl RV fxn. PASP nl. . GERD (gastroesophageal reflux disease)   . Headache   "dull one sometimes daily, at least weekly in last couple months" (07/22/2018) . Hyperlipidemia  . Hypertension  . 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 Joint pain in fingers of right hand  . Lichen sclerosus  . Osteopenia  . Pneumonia 2012? X 1 . 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 Psoriasis  . Psoriatic arthritis ([place])   " dx'd the 1st of this year, 2019" (07/22/2018) . Psoriatic arthritis ([place])  . Rosacea  . Seborrheic keratosis  . Second degree heart block   a. 07/2018 s/p SJM Assurity MRI model PM2271 (Ser# [id number]). . Sleep apnea   "using nasal pilllows since ~ 12/2017" (07/22/2018) . TMJ (dislocation of temporomandibular joint)  Past Surgical History: Past Surgical History: Procedure Laterality Date . APPENDECTOMY   . BREAST CYST ASPIRATION Bilateral  . BREAST CYST EXCISION Right 1978  benign . COLONOSCOPY   . COLONOSCOPY WITH PROPOFOL N/A 07/10/2019  Procedure: COLONOSCOPY WITH PROPOFOL;  Surgeon: Lollie Sails, MD;  Location: [facility] ENDOSCOPY;  Service: Endoscopy;  Laterality: N/A; . ESOPHAGOGASTRODUODENOSCOPY (EGD) WITH PROPOFOL N/A 07/10/2019  Procedure: ESOPHAGOGASTRODUODENOSCOPY (EGD) WITH PROPOFOL;  Surgeon: Lollie Sails, MD;  Location: [facility] ENDOSCOPY;  Service: Endoscopy;  Laterality: N/A; . HYSTEROSCOPY WITH D & C N/A 11/05/2018  Procedure: DILATATION AND CURETTAGE /HYSTEROSCOPY;  Surgeon: Homero Fellers, MD;  Location: [facility] ORS;  Service: Gynecology;  Laterality: N/A; . INSERT / REPLACE / REMOVE PACEMAKER   . PACEMAKER IMPLANT N/A 07/23/2018  SJM Assurity [id number] implanted by Dr Rayann Heman for mobitz II second  degree AV block . UTERINE POLYPS REMOVAL   HPI: Pt is an 79 yo female with presumed [place]. She is a former smoker transferred to [facility] from [place] on 6/26 with weakness, numbness, and tingling after being started on Enbrel for psoriasis. Patient developed severe hyponatremia, had a seizure and was transferred to  [facility] on 7/03 for further neurologic assessment.  Required intubation for airway protection on 7/3 and waws extubated on 7/11 but has needed biPAP intermittently for respiratory distress. Pt became unresponsive during MRI on 7/6 and had code blue; sustained bilateral pneumothoraces after bag ventilation for which bilateral chest tubes were placed. MRI was negative for acute changes. Patient started on PLEX plasmaphoresis 7/7. CXR 7/12: Bibasilar atelectasis/infiltrate with no significant interval change.Cortrak placed 7/9  No data recorded Assessment / Plan / Recommendation CHL IP CLINICAL IMPRESSIONS 04/28/2020 Clinical Impression Pt demonstrated improvements in pharyngeal swallow function today in comparison to last MBSS conducted on 04/21/20. Although flash penetration of thin barium was observed on first trial of thin via cup, it remained above the level of the vocal folds and was ejected from the vestibule during the swallow (PAS scale 2). Pt also demonstrated decreased bolus cohesion, which resulted in piecemeal swallowing of thin and regular texture boluses, however extra swallows were beneficial to clear mild vallecular residue of thin and were mostly sensed by pt without need for cues from clinician. Use of the supraglottic swallow strategy did not appear to influence airway protection, therefore it is no longer necessary for her to perform during PO intake. Flash penetration also observed X1 when pt consumed barium pill with thin, however no aspiration observed and all penetrate ejected during the swallow. Recommend pt upgrade to regular solid texture diet with thin liquids using slow rate, small sips/bites, and using extra dry swallows with liquids. Medications may be administered whole with thins or whole in applesauce, depending on pt preference. Provide set up and intermittent supervision during meals. ST will continue to provide skilled interventions for dysphagia while inpatient in order to ensure diet  safety and efficiency. SLP Visit Diagnosis Dysphagia, oropharyngeal phase (R13.12) Attention and concentration deficit following -- Frontal lobe and executive function deficit following -- Impact on safety and function Mild aspiration risk   CHL IP TREATMENT RECOMMENDATION 04/15/2020 Treatment Recommendations Therapy as outlined in treatment plan below   Prognosis 04/15/2020 Prognosis for Safe Diet Advancement Good Barriers to Reach Goals -- Barriers/Prognosis Comment -- CHL IP DIET RECOMMENDATION 04/28/2020 SLP Diet Recommendations Thin liquid;Regular solids Liquid Administration via Cup;Straw Medication Administration Whole meds with liquid Compensations Slow rate;Small sips/bites;Multiple dry swallows after each bite/sip Postural Changes Remain semi-upright after after feeds/meals (Comment);Seated upright at 90 degrees   CHL IP OTHER RECOMMENDATIONS 04/28/2020 Recommended Consults -- Oral Care Recommendations Oral care BID Other Recommendations --   CHL IP FOLLOW UP RECOMMENDATIONS 04/16/2020 Follow up Recommendations Inpatient Rehab   CHL IP FREQUENCY AND DURATION 04/15/2020 Speech Therapy Frequency (ACUTE ONLY) min 2x/week Treatment Duration 2 weeks      CHL IP ORAL PHASE 04/28/2020 Oral Phase Impaired Oral - Pudding Teaspoon -- Oral - Pudding Cup -- Oral - Honey Teaspoon -- Oral - Honey Cup -- Oral - Nectar Teaspoon -- Oral - Nectar Cup NT Oral - Nectar Straw -- Oral - Thin Teaspoon -- Oral - Thin Cup Decreased bolus cohesion;Piecemeal swallowing Oral - Thin Straw Piecemeal swallowing;Decreased bolus cohesion Oral - Puree NT Oral - Mech Soft NT Oral - Regular Lingual/palatal residue Oral -  Multi-Consistency -- Oral - Pill WFL Oral Phase - Comment --  CHL IP PHARYNGEAL PHASE 04/28/2020 Pharyngeal Phase Impaired Pharyngeal- Pudding Teaspoon -- Pharyngeal -- Pharyngeal- Pudding Cup -- Pharyngeal -- Pharyngeal- Honey Teaspoon NT Pharyngeal -- Pharyngeal- Honey Cup -- Pharyngeal -- Pharyngeal- Nectar Teaspoon NT Pharyngeal  -- Pharyngeal- Nectar Cup -- Pharyngeal -- Pharyngeal- Nectar Straw -- Pharyngeal -- Pharyngeal- Thin Teaspoon NT Pharyngeal -- Pharyngeal- Thin Cup Reduced epiglottic inversion;Reduced anterior laryngeal mobility;Reduced laryngeal elevation;Penetration/Aspiration during swallow;Pharyngeal residue - valleculae;Compensatory strategies attempted (with notebox) Pharyngeal Material enters airway, remains ABOVE vocal cords then ejected out Pharyngeal- Thin Straw Reduced epiglottic inversion;Reduced laryngeal elevation;Reduced anterior laryngeal mobility;Penetration/Aspiration during swallow;Pharyngeal residue - valleculae Pharyngeal Material enters airway, remains ABOVE vocal cords then ejected out Pharyngeal- Puree NT Pharyngeal -- Pharyngeal- Mechanical Soft NT Pharyngeal -- Pharyngeal- Regular WFL Pharyngeal -- Pharyngeal- Multi-consistency -- Pharyngeal -- Pharyngeal- Pill WFL Pharyngeal -- Pharyngeal Comment --  CHL IP CERVICAL ESOPHAGEAL PHASE 04/28/2020 Cervical Esophageal Phase WFL Pudding Teaspoon -- Pudding Cup -- Honey Teaspoon -- Honey Cup -- Nectar Teaspoon -- Nectar Cup -- Nectar Straw -- Thin Teaspoon -- Thin Cup -- Thin Straw -- Puree -- Mechanical Soft -- Regular -- Multi-consistency -- Pill -- Cervical Esophageal Comment -- Arbutus Leas 04/28/2020, 9:53 AM              DG Swallowing Func-Speech Pathology  Result Date: 04/21/2020 Objective Swallowing Evaluation: Type of Study: MBS-Modified Barium Swallow Study  Patient Details Name: Alison Huffman MRN: [id number] Date of Birth: March 02, 1941 Today's Date: 04/21/2020 Past Medical History: Past Medical History: Diagnosis Date . Ankle pain, right  . Atypical chest pain   a. 10/2018 MV: EF 57%. No ischemia/infarct. . Coronary artery disease  . Diastolic dysfunction   a. 04/2018 Echo: EF 55-60%, no rwma, Gr1 DD. Triv AI, mild MR. Mod dil LA. Nl RV fxn. PASP nl. . GERD (gastroesophageal reflux disease)  . Headache   "dull one sometimes daily, at least weekly in  last couple months" (07/22/2018) . Hyperlipidemia  . Hypertension  . 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 Joint pain in fingers of right hand  . Lichen sclerosus  . Osteopenia  . Pneumonia 2012? X 1 . 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 Psoriasis  . Psoriatic arthritis ([place])   " dx'd the 1st of this year, 2019" (07/22/2018) . Psoriatic arthritis ([place])  . Rosacea  . Seborrheic keratosis  . Second degree heart block   a. 07/2018 s/p SJM Assurity MRI model PM2271 (Ser# [id number]). . Sleep apnea   "using nasal pilllows since ~ 12/2017" (07/22/2018) . TMJ (dislocation of temporomandibular joint)  Past Surgical History: Past Surgical History: Procedure Laterality Date . APPENDECTOMY   . BREAST CYST ASPIRATION Bilateral  . BREAST CYST EXCISION Right 1978  benign . COLONOSCOPY   . COLONOSCOPY WITH PROPOFOL N/A 07/10/2019  Procedure: COLONOSCOPY WITH PROPOFOL;  Surgeon: Lollie Sails, MD;  Location: [facility] ENDOSCOPY;  Service: Endoscopy;  Laterality: N/A; . ESOPHAGOGASTRODUODENOSCOPY (EGD) WITH PROPOFOL N/A 07/10/2019  Procedure: ESOPHAGOGASTRODUODENOSCOPY (EGD) WITH PROPOFOL;  Surgeon: Lollie Sails, MD;  Location: [facility] ENDOSCOPY;  Service: Endoscopy;  Laterality: N/A; . HYSTEROSCOPY WITH D & C N/A 11/05/2018  Procedure: DILATATION AND CURETTAGE /HYSTEROSCOPY;  Surgeon: Homero Fellers, MD;  Location: [facility] ORS;  Service: Gynecology;  Laterality: N/A; . INSERT / REPLACE / REMOVE PACEMAKER   . PACEMAKER IMPLANT N/A 07/23/2018  SJM Assurity [id number] implanted by Dr Rayann Heman for mobitz II second degree AV block . UTERINE POLYPS REMOVAL  HPI: Pt is an 79 yo female with presumed [place]. She is a former smoker transferred to [facility] from [place] on 6/26 with weakness, numbness, and tingling after being started on Enbrel for psoriasis. Patient developed severe hyponatremia, had a seizure and was transferred to [facility] on 7/03 for further neurologic assessment.  Required  intubation for airway protection on 7/3 and waws extubated on 7/11 but has needed biPAP intermittently for respiratory distress. Pt became unresponsive during MRI on 7/6 and had code blue; sustained bilateral pneumothoraces after bag ventilation for which bilateral chest tubes were placed. MRI was negative for acute changes. Patient started on PLEX plasmaphoresis 7/7. CXR 7/12: Bibasilar atelectasis/infiltrate with no significant interval change.Cortrak placed 7/9  No data recorded Assessment / Plan / Recommendation CHL IP CLINICAL IMPRESSIONS 04/21/2020 Clinical Impression Pt exhibits mild-mod oropharyngeal dysphagia characterized by a mix of sensed and silent aspiration of thin liquids via straw and cup. Nearly absent epiglottic inversion noted across consistencies, which is likely impacted by NG tube placement, and unfortunately also decreases airway protection. Reduced base of tongue retraction and difficulty with oral containment of thin boluses resulted in premature spillage into the pharynx at times. Mild vallecular and pyriform sinus residue noted across thin and nectar, but not significant difference between the two in terms of quantity of residue. Pt's swallow initiation was more timely and she appeared to have better oral control of nectar thick boluses in comparison to thin, and no penetration or aspiration was observed. Hard and extra dry swallow was moderately effective to clear residue. Puree and mechanical soft solid trials were WFL, no airway intrusion or difficulty with mastication or oral clearance noted. Given pt's recent history of respiratory failure and fragile medical state with reduced physical mobility at this time and chronic diagnosis Guillain-Barre Syndrome, would recommend initiation of conservative Dysphagia 3 (mechanical soft), nectar thick liquid diet, no straws, medications may be given whole in applesauce. Pt also educated on continuing hard swallow and extra dry swallow during  intake. Please provide intermittent supervision during meals to ensure use of strategies. ST will continue to provide skilled interventions to work toward further advancement and independence with use of strategies while inpatient. SLP Visit Diagnosis Dysphagia, oropharyngeal phase (R13.12) Attention and concentration deficit following -- Frontal lobe and executive function deficit following -- Impact on safety and function Moderate aspiration risk   CHL IP TREATMENT RECOMMENDATION 04/15/2020 Treatment Recommendations Therapy as outlined in treatment plan below   Prognosis 04/15/2020 Prognosis for Safe Diet Advancement Good Barriers to Reach Goals -- Barriers/Prognosis Comment -- CHL IP DIET RECOMMENDATION 04/21/2020 SLP Diet Recommendations Dysphagia 3 (Mech soft) solids;Nectar thick liquid Liquid Administration via Cup;No straw Medication Administration Whole meds with puree Compensations Effortful swallow;Multiple dry swallows after each bite/sip;Small sips/bites;Slow rate Postural Changes Remain semi-upright after after feeds/meals (Comment);Seated upright at 90 degrees   CHL IP OTHER RECOMMENDATIONS 04/21/2020 Recommended Consults -- Oral Care Recommendations Oral care BID Other Recommendations Remove water pitcher;Order thickener from pharmacy;Have oral suction available   CHL IP FOLLOW UP RECOMMENDATIONS 04/16/2020 Follow up Recommendations Inpatient Rehab   CHL IP FREQUENCY AND DURATION 04/15/2020 Speech Therapy Frequency (ACUTE ONLY) min 2x/week Treatment Duration 2 weeks      CHL IP ORAL PHASE 04/21/2020 Oral Phase Impaired Oral - Pudding Teaspoon -- Oral - Pudding Cup -- Oral - Honey Teaspoon -- Oral - Honey Cup -- Oral - Nectar Teaspoon -- Oral - Nectar Cup Piecemeal swallowing Oral - Nectar Straw -- Oral -  Thin Teaspoon -- Oral - Thin Cup Piecemeal swallowing;Premature spillage;Holding of bolus Oral - Thin Straw Piecemeal swallowing;Premature spillage Oral - Puree WFL Oral - Mech Soft WFL Oral - Regular --  Oral - Multi-Consistency -- Oral - Pill -- Oral Phase - Comment --  CHL IP PHARYNGEAL PHASE 04/21/2020 Pharyngeal Phase Impaired Pharyngeal- Pudding Teaspoon -- Pharyngeal -- Pharyngeal- Pudding Cup -- Pharyngeal -- Pharyngeal- Honey Teaspoon NT Pharyngeal -- Pharyngeal- Honey Cup -- Pharyngeal -- Pharyngeal- Nectar Teaspoon NT Pharyngeal -- Pharyngeal- Nectar Cup -- Pharyngeal -- Pharyngeal- Nectar Straw -- Pharyngeal -- Pharyngeal- Thin Teaspoon NT Pharyngeal -- Pharyngeal- Thin Cup Delayed swallow initiation-vallecula;Reduced anterior laryngeal mobility;Reduced airway/laryngeal closure;Reduced epiglottic inversion;Reduced tongue base retraction;Penetration/Aspiration during swallow;Pharyngeal residue - valleculae;Pharyngeal residue - pyriform Pharyngeal Material enters airway, passes BELOW cords without attempt by patient to eject out (silent aspiration) Pharyngeal- Thin Straw Reduced epiglottic inversion;Reduced anterior laryngeal mobility;Reduced airway/laryngeal closure;Reduced tongue base retraction;Penetration/Aspiration during swallow;Pharyngeal residue - valleculae;Pharyngeal residue - pyriform Pharyngeal Material enters airway, passes BELOW cords and not ejected out despite cough attempt by patient Pharyngeal- Puree WFL Pharyngeal -- Pharyngeal- Mechanical Soft WFL Pharyngeal -- Pharyngeal- Regular -- Pharyngeal -- Pharyngeal- Multi-consistency -- Pharyngeal -- Pharyngeal- Pill -- Pharyngeal -- Pharyngeal Comment --  CHL IP CERVICAL ESOPHAGEAL PHASE 04/21/2020 Cervical Esophageal Phase WFL Pudding Teaspoon -- Pudding Cup -- Honey Teaspoon -- Honey Cup -- Nectar Teaspoon -- Nectar Cup -- Nectar Straw -- Thin Teaspoon -- Thin Cup -- Thin Straw -- Puree -- Mechanical Soft -- Regular -- Multi-consistency -- Pill -- Cervical Esophageal Comment -- Arbutus Leas 04/21/2020, 10:31 AM               US Abdomen Limited RUQ  Result Date: 04/12/2020 CLINICAL DATA:  Elevated alkaline phosphatase level. EXAM:  ULTRASOUND ABDOMEN LIMITED RIGHT UPPER QUADRANT COMPARISON:  None. FINDINGS: Gallbladder: Physiologically distended. Mild layering sludge. Rounded echogenic structure without posterior shadowing measuring 9 mm in the gallbladder is likely a sludge ball. Possibility of polyp is difficult to exclude. No internal vascularity. No shadowing gallstones. No gallbladder wall thickening. No pericholecystic fluid. No sonographic Murphy sign noted by sonographer. Common bile duct: Diameter: 2-3 mm, normal. Liver: No focal lesion identified. Mildly increased in parenchymal echogenicity. Portal vein is patent on color Doppler imaging with normal direction of blood flow towards the liver. Other: Right pleural effusion. IMPRESSION: 1. Borderline hepatic steatosis. 2. Gallbladder sludge. Probable 10 mm sludge ball, however gallbladder polyp is difficult to exclude. Recommend follow-up as a gallbladder polyp, ultrasound in 1 year. 3. No gallstones or sonographic evidence of acute gallbladder inflammation. No biliary dilatation. Electronically Signed   By: Keith Rake M.D.   On: 04/12/2020 22:26    Labs:  Basic Metabolic Panel: Last Labs       Recent Labs  Lab 05/01/20 0447 05/03/20 0513  NA 138 140  K 4.1 4.1  CL 105 106  CO2 27 27  GLUCOSE 99 94  BUN 11 10  CREATININE 0.46 0.50  CALCIUM 9.0 8.9  MG 2.1  --       CBC: CBC Latest Ref Rng & Units 05/03/2020 04/26/2020 04/19/2020  WBC 4.0 - 10.5 K/uL 5.4 6.8 10.7(H)  Hemoglobin 12.0 - 15.0 g/dL 10.6(L) 11.1(L) 11.1(L)  Hematocrit 36 - 46 % 33.0(L) 34.8(L) 35.0(L)  Platelets 150 - 400 K/uL 194 244 405(H)    CBG: Last Labs          Recent Labs  Lab 05/03/20 0601 05/03/20 1138 05/03/20 1647 05/04/20 4235 05/05/20 3614  GLUCAP 84 80 90 79  79 96      Brief HPI:   Alison Huffman is a 79 y.o. female with history of CAD, PPM, HTN, OSA, psoriatic arthritis who was admitted to [facility] on 03/27/2020 with reports of numbness and  tingling started in BLE, progressed to toes of and BUE as well as weakness.  Neurology consulted for input and expressed concerns of GBS with progressive numbness/weakness with decreased reflexes.  LP negative but she was started on IVIG empirically.  Hospital course significant for severe hyponatremia treated with fluid restriction.  Due to poor response, she was transferred to [facility] for MRI and further management but developed seizure shortly after transfer with respiratory distress requiring intubation.   EEG was negative for seizures therefore Keppra was discontinued and seizure felt to be due to hyponatremia.   Hospital course significant for respiratory distress due to mucous plugging with CODE BLUE requiring manual ventilation.  She did develop bilateral pneumothoraces due to high pressure requiring bilateral chest tube.  MRI brain and spine was negative for acute abnormality or significant stenosis.  She was treated with plasmapheresis with improvement in strength.  She tolerated extubation but required course of Solu-Medrol due to stridor as well as cefepime for staph HCAP.  She was maintained on n.p.o. status due to severe dysphagia with tube feeds ongoing.  Foley was in place due to urinary retention.  Dr. Malen Gauze felt that patient likely with GBS rather than Enbrel associated polyneuropathy given temporal association.  He recommended follow-up EMG/NCS and follow-up with neuromuscular specialist after discharge.  Therapy was ongoing revealing significant deficits in mobility and ADLs.  CIR was recommended due to functional decline.   Hospital Course: Alison Huffman was admitted to rehab 04/18/2020 for inpatient therapies to consist of PT, ST and OT at least three hours five days a week. Past admission physiatrist, therapy team and rehab RN have worked together to provide customized collaborative inpatient rehab. Her blood pressures were monitored on TID basis and has been stable.  Stage II sacral wound is healing well and Eucerin added to dry skin. Neuropathy is improving and patient declined any mediations for treatment. Bicipital tendinitis has improved with diclofenac X 3 days as well as heat for local measures.  She has been seizure free during her stay. Foley was removed and she was started on bladder program. Voiding function has improved and she is voiding well. Bowel function has improved with resolution of diarrhea and bloating/gas off off tube feeds.   She reported use of CPAP at home and this was resumed on 07/27 with improvement in sleep hygiene. Follow up BMET showed hypokalemia which has resolved with supplementation and improvement in intake. Tube feed was discontinued as swallow function has improved and she is tolerating regular diet with intermittent supervision. Renal status is stable and abnormal LFTs have resolved.  Follow up CBC showed that leucocytosis has resolved and H/H is stable overall. She has made steady progress during her rehab stay but continues to be limited by endurance levels and requires CGA with activity. Due to lack of social support, SNF was recommended for progressive therapy. She was discharged to [facility] on 05/05/20   Rehab course: During patient's stay in rehab weekly team conferences were held to monitor patient's progress, set goals and discuss barriers to discharge. At admission, patient required max to total assist for ADL tasks and max  assist with mobility. She exhibited dysphagia and n.p.o. was  recommended with tube feeds for nutritional support.  She also reported mild cognitive deficits with delay in complex problem-solving and recall of new information. She  has had improvement in activity tolerance, balance, postural control as well as ability to compensate for deficits. She is able to complete ADL tasks with CGA to supervision level. She is modified independent for bed mobility with use of bedrail. She requires CGA  for transfers and for balance. She is able to ambulate 60' with RW and CGA.  She is able to perform vocal exercises, follow swallow precautions and use RMST with cues at ,modified independent to supervision level.   Disposition: Skilled Nursing Facility  Diet: Regular textures. Small bites/sips. Intermittent supervision for safety.   Special Instructions: 1. K pad to bilateral shoulders prn. 2. Continue CPAP at nights.  3. Will need repeat GB ultrasound in a year to follow up on GB polyp.         Discharge Instructions    Ambulatory referral to Neurology   Complete by: As directed    An appointment is requested in approximately: 2-3 weeks   Ambulatory referral to Physical Medicine Rehab   Complete by: As directed    3-4 weeks follow up appt      Patient presenting to office today reporting she is doing better.  She was encouraged to remain on CPAP therapy.  CPAP compliance report shows excellent compliance.  See compliance report listed below:  05/12/2020-06/10/2020-2020 21-29 on last 30 days used, 29 of those days greater than 4 hours, average usage 8 hours and 53 minutes, APAP setting 5-15, 95th percentile 11.9, AHI 0.9  Patient reports no issues with using her CPAP.  She feels that is helping.  Status post discharge and intubation she is now been evaluated by ear nose and throat.  She saw them 2 weeks ago.  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  She is currently working with speech therapy and they increased voice exercises status post the 06/05/2020 CT of neck  She is an upcoming appointment with primary care on 06/21/2020.  She is establishing care in the outpatient setting with neurology in November/2021.  She hopes to be discharged from rehab today.  Still having voice hoarseness, fatigue and shortness of breath.  She is working with physical therapy to improve overall physical conditioning.  She feels that this is slowly improving.    Questionaires  / Pulmonary Flowsheets:   ACT:  No flowsheet data found.  MMRC: No flowsheet data found.  Epworth:  Results of the Epworth flowsheet 01/14/2018 04/02/2017  Sitting and reading 2 3  Watching TV 1 1  Sitting, inactive in a public place (e.g. a theatre or a meeting) 0 1  As a passenger in a car for an hour without a break 2 0  Lying down to rest in the afternoon when circumstances permit 3 2  Sitting and talking to someone 0 0  Sitting quietly after a lunch without alcohol 1 1  In a car, while stopped for a few minutes in traffic 0 0  Total score 9 8    Tests:   -Known obstructive sleep apnea, diagnosed 2015. Was being treated with a dental device, has been developing progressive jaw pain and "jaw slipping", having difficulty using the device.   - Sent for split-night which showed AHI 24, started on auto CPAP with pressure range 5-15 cmH2O.  Has been doing well with that pressure, download shows residual AHI of less  than 5 with good compliance.  06/05/2020-CT chest with contrast-mild cylindrical bronchiectasis in the right lower lobe, small areas of groundglass opacities in right lung base nonspecific may be postinfectious or postinflammatory, mildly enlarged heart, aortic arthrosclerosis  06/04/2020-CT soft tissue neck-findings of right vocal cord paresis since comparison in June/2021, no interval abnormality or mass along the course of the recurrent laryngeal nerves  FENO:  No results found for: NITRICOXIDE  PFT: No flowsheet data found.  WALK:  No flowsheet data found.  Imaging: CT SOFT TISSUE NECK W CONTRAST  Result Date: 06/05/2020 CLINICAL DATA:  Worsened is after being on ventilator July 2021 EXAM: CT NECK WITH CONTRAST TECHNIQUE: Multidetector CT imaging of the neck was performed using the standard protocol following the bolus administration of intravenous contrast. CONTRAST:  50mL OMNIPAQUE IOHEXOL 300 MG/ML  SOLN COMPARISON:  CTA of the neck 03/27/2020 FINDINGS: Pharynx and  larynx: New findings of right vocal cord paresis with medial ization of the right aryepiglottic fold and piriform sinus enlargement. Asymmetric ossification of the left arytenoid cartilage, stable and incidental. No masslike findings. Salivary glands: No inflammation, mass, or stone. Thyroid: Normal. Lymph nodes: None enlarged or abnormal density. Vascular: Age normal Limited intracranial: Negative. Visualized orbits: Negative. Mastoids and visualized paranasal sinuses: Clear Skeleton: No acute or aggressive finding. Upper chest: Pacer leads from the left. IMPRESSION: 1. 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 2. No interval abnormality or mass along the course of the recurrent laryngeal nerves. Electronically Signed   By: Monte Fantasia M.D.   On: 06/05/2020 12:07   CT CHEST W CONTRAST  Result Date: 06/05/2020 CLINICAL DATA:  Patient was on a ventilator until 04/11/2020 and she is complaining of persistent hoarseness since then. EXAM: CT CHEST WITH CONTRAST TECHNIQUE: Multidetector CT imaging of the chest was performed during intravenous contrast administration. CONTRAST:  35mL OMNIPAQUE IOHEXOL 300 MG/ML  SOLN COMPARISON:  Chest radiograph April 16, 2020 FINDINGS: Cardiovascular: Mildly enlarged heart. Calcific atherosclerotic disease of the aorta. Dual lead pacemaker. Mediastinum/Nodes: No enlarged mediastinal, hilar, or axillary lymph nodes. Thyroid gland, trachea, and esophagus demonstrate no significant findings. Lungs/Pleura: Mild cylindrical bronchiectasis in the right lower lobe. Mild ground-glass opacities in the right lung base. Bilateral lower lobe dependent subpleural scarring versus atelectasis. Upper Abdomen: No acute abnormality. Musculoskeletal: No chest wall abnormality. No acute or significant osseous findings. IMPRESSION: 1. Mild cylindrical bronchiectasis in the right lower lobe. 2. Small area of ground-glass opacities in the right lung base, nonspecific, may be post  infectious or post inflammatory. 3. Bilateral lower lobe dependent subpleural scarring versus atelectasis. 4. Mildly enlarged heart. 5. Aortic atherosclerosis. Aortic Atherosclerosis (ICD10-I70.0). Electronically Signed   By: Fidela Salisbury M.D.   On: 06/05/2020 12:41    Lab Results:  CBC    Component Value Date/Time   WBC 5.4 05/03/2020 0513   RBC 3.48 (L) 05/03/2020 0513   HGB 10.6 (L) 05/03/2020 0513   HCT 33.0 (L) 05/03/2020 0513   PLT 194 05/03/2020 0513   MCV 94.8 05/03/2020 0513   MCH 30.5 05/03/2020 0513   MCHC 32.1 05/03/2020 0513   RDW 14.4 05/03/2020 0513   LYMPHSABS 1.3 04/19/2020 0423   MONOABS 0.5 04/19/2020 0423   EOSABS 0.2 04/19/2020 0423   BASOSABS 0.0 04/19/2020 0423    BMET    Component Value Date/Time   NA 140 05/03/2020 0513   K 4.1 05/03/2020 0513   CL 106 05/03/2020 0513   CO2 27 05/03/2020 0513   GLUCOSE  94 05/03/2020 0513   BUN 10 05/03/2020 0513   CREATININE 0.50 05/03/2020 0513   CALCIUM 8.9 05/03/2020 0513   GFRNONAA >60 05/03/2020 0513   GFRAA >60 05/03/2020 0513    BNP No results found for: BNP  ProBNP No results found for: PROBNP  Specialty Problems      Pulmonary Problems   OSA (obstructive sleep apnea)   Allergic rhinitis   Exertional dyspnea   Acute on chronic respiratory failure with hypoxemia (HCC)   Respiratory failure (HCC)   Oropharyngeal dysphagia   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 Etanercept     Guillain Baree  . Nickel     Immunization History  Administered Date(s) Administered  . Fluad Quad(high Dose 65+) 06/18/2019  . Hepatitis B 01/01/1990, 01/31/1990, 07/03/1990  . Influenza-Unspecified 06/18/2017, 07/10/2018  . Pneumococcal Conjugate-13 05/06/2014  . Pneumococcal Polysaccharide-23 05/10/2005  . Tdap 09/04/2017  . Unspecified SARS-COV-2 Vaccination 10/09/2019, 11/05/2019  . Zoster 08/11/2008  . Zoster Recombinat (Shingrix)  08/26/2019    Past Medical History:  Diagnosis Date  . Ankle pain, right   . Atypical chest pain    a. 10/2018 MV: EF 57%. No ischemia/infarct.  . Coronary artery disease   . Diastolic dysfunction    a. 04/2018 Echo: EF 55-60%, no rwma, Gr1 DD. Triv AI, mild MR. Mod dil LA. Nl RV fxn. PASP nl.  . GERD (gastroesophageal reflux disease)   . Headache    "dull one sometimes daily, at least weekly in last couple months" (07/22/2018)  . Hyperlipidemia   . Hypertension   . 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 Joint pain in fingers of right hand   . Lichen sclerosus   . Osteopenia   . Pneumonia 2012? X 1  . 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 Psoriasis   . Psoriatic arthritis ([place])    " dx'd the 1st of this year, 2019" (07/22/2018)  . Psoriatic arthritis ([place])   . Rosacea   . Seborrheic keratosis   . Second degree heart block    a. 07/2018 s/p SJM Assurity MRI model PM2271 (Ser# [id number]).  . Sleep apnea    "using nasal pilllows since ~ 12/2017" (07/22/2018)  . TMJ (dislocation of temporomandibular joint)     Tobacco History: Social History   Tobacco Use  Smoking Status Never Smoker  Smokeless Tobacco Never Used  Tobacco Comment   07/22/2018 "smoked some in college; 1960s;  nothing since"   Counseling given: Not Answered Comment: 07/22/2018 "smoked some in college; 1960s;  nothing since"   Continue to not smoke  Outpatient Encounter Medications as of 06/14/2020  Medication Sig  . acetaminophen (TYLENOL) 325 MG tablet Take 325-650 mg by mouth every 6 (six) hours as needed for mild pain or headache.   . bisacodyl (DULCOLAX) 10 MG suppository Place 1 suppository (10 mg total) rectally daily as needed for moderate constipation.  . chlorhexidine (PERIDEX) 0.12 % solution 15 mLs by Mouth Rinse route 2 (two) times daily.  [name] desonide (DESOWEN) 0.05 % ointment Apply 1 application topically 2 (two) times daily as needed (for psoriasis).   . feeding supplement, ENSURE  ENLIVE, (ENSURE ENLIVE) LIQD Take 237 mLs by mouth daily.  . hydrocerin (EUCERIN) CREA Apply 1 application topically 2 (two) times daily. To bilateral feet.  . Mouthwashes (MOUTH RINSE) LIQD solution 15 mLs by Mouth Rinse route 2 times daily at 12 noon and 4 pm.  .  pantoprazole (PROTONIX) 40 MG tablet Take 1 tablet (40 mg total) by mouth daily.  . polyethylene glycol (MIRALAX / GLYCOLAX) 17 g packet Take 17 g by mouth daily as needed for severe constipation.  . senna-docusate (SENOKOT-S) 8.6-50 MG tablet Take 1 tablet by mouth at bedtime as needed for mild constipation.  . tacrolimus (PROTOPIC) 0.1 % ointment Apply topically 2 (two) times daily as needed (for psoriasis).    No facility-administered encounter medications on file as of 06/14/2020.     Review of Systems  Review of Systems  Constitutional: Positive for fatigue. Negative for activity change and fever.  HENT: Positive for voice change. Negative for sinus pressure, sinus pain and sore throat.   Respiratory: Positive for shortness of breath. Negative for cough and wheezing.   Cardiovascular: Negative for chest pain and palpitations.  Gastrointestinal: Negative for diarrhea, nausea and vomiting.  Musculoskeletal: Negative for arthralgias.  Neurological: Negative for dizziness.  Psychiatric/Behavioral: Negative for sleep disturbance. The patient is not nervous/anxious.      Physical Exam  BP 130/70 (BP Location: Left Arm, Patient Position: Sitting, Cuff Size: Normal)   Pulse 99   Ht 5\' 6"  (1.676 m)   Wt 169 lb 12.8 oz (77 kg)   SpO2 97%   BMI 27.41 kg/m   Wt Readings from Last 5 Encounters:  06/14/20 169 lb 12.8 oz (77 kg)  05/28/20 168 lb (76.2 kg)  04/18/20 150 lb 9.2 oz (68.3 kg)  04/18/20 160 lb 3.2 oz (72.7 kg)  03/27/20 168 lb (76.2 kg)    BMI Readings from Last 5 Encounters:  06/14/20 27.41 kg/m  05/28/20 27.12 kg/m  04/18/20 24.30 kg/m  04/18/20 25.86 kg/m  03/27/20 27.12 kg/m     Physical  Exam Vitals and nursing note reviewed.  Constitutional:      General: She is not in acute distress.    Appearance: Normal appearance. She is normal weight.  HENT:     Head: Normocephalic and atraumatic.     Right Ear: External ear normal.     Left Ear: External ear normal.     Mouth/Throat:     Mouth: Mucous membranes are moist.     Pharynx: Oropharynx is clear.  Eyes:     Pupils: Pupils are equal, round, and reactive to light.  Cardiovascular:     Rate and Rhythm: Normal rate and regular rhythm.     Pulses: Normal pulses.     Heart sounds: Normal heart sounds. No murmur heard.   Pulmonary:     Effort: Pulmonary effort is normal. No respiratory distress.     Breath sounds: No decreased air movement. No decreased breath sounds, wheezing or rales.  Musculoskeletal:     Cervical back: Normal range of motion.  Skin:    General: Skin is warm and dry.     Capillary Refill: Capillary refill takes less than 2 seconds.  Neurological:     General: No focal deficit present.     Mental Status: She is alert and oriented to person, place, and time. Mental status is at baseline.     Gait: Gait normal.  Psychiatric:        Mood and Affect: Mood normal.        Behavior: Behavior normal.        Thought Content: Thought content normal.        Judgment: Judgment normal.       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[facility]) Plan: Establish care with neurology as planned If breathing is worsen please let our office know so we can obtain pulmonary function testing  Bronchiectasis without complication ([place]) Mild bronchiectasis seen on CT imaging  Plan: We will clinically monitor If patient starts to have more productive daily sputum or increased sputum burden could get patient started on a flutter valve  OSA (obstructive sleep apnea) Plan: Continue CPAP therapy Follow-up in 3 months  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  Plan: Continue speech therapy Continue to follow-up with ENT  Physical deconditioning Plan: Continue to work on physical deconditioning Continue to work with physical therapy Continue to increase overall daily physical exercise  Healthcare maintenance Plan: Discuss with neurology whether or not they feel that you would be a candidate for the seasonal flu vaccine  Abnormal findings on diagnostic imaging of lung GGO seen on CT of chest earlier this month Nonspecific findings  Plan: We will repeat CT of chest in 3 months with follow-up with the pulmonologist in our office    Return in about 3 months (around 09/13/2020), or if symptoms worsen or fail to improve, for [facility] - Dr. Patsey Berthold, [facility] - Dr. Mortimer Fries.   Lauraine Rinne, NP 06/14/2020   This appointment required 42 minutes of patient care (this includes precharting, chart review, review of results, face-to-face care, etc.).

## 2020-06-14 NOTE — Assessment & Plan Note (Signed)
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

## 2020-06-14 NOTE — Patient Instructions (Addendum)
You were seen today by Lauraine Rinne, NP  for:   1. OSA (obstructive sleep apnea)  We recommend that you continue using your CPAP daily >>>Keep up the hard work using your device >>> Goal should be wearing this for the entire night that you are sleeping, at least 4 to 6 hours  Remember:  . Do not drive or operate heavy machinery if tired or drowsy.  . Please notify the supply company and office if you are unable to use your device regularly due to missing supplies or machine being broken.  . Work on maintaining a healthy weight and following your recommended nutrition plan  . Maintain proper daily exercise and movement  . Maintaining proper use of your device can also help improve management of other chronic illnesses such as: Blood pressure, blood sugars, and weight management.   BiPAP/ CPAP Cleaning:  >>>Clean weekly, with Dawn soap, and bottle brush.  Set up to air dry. >>> Wipe mask out daily with wet wipe or towelette   2. Abnormal findings on diagnostic imaging of lung  - CT Chest Wo Contrast; Future  We will order a CT of your chest to be completed in 3 months with follow-up with our office  3. 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 Let us know if you have cough with change in mucus color or fevers or chills.  At that point you would need an antibiotic. Maintain a healthy nutritious diet, eating whole foods Take your medications as prescribed    4. Vocal cord paresis  Continue to work with ear nose and throat and speech therapy  5. Guillain Barr syndrome [facility])  Establish care with neurology  Discuss with them whether or not they believe you should receive the seasonal flu vaccine at this time  6. Physical deconditioning 7. Healthcare maintenance  Continue to work with physical therapy and working on increasing  your overall physical mobility   We recommend today:  Orders Placed This Encounter  Procedures  . CT Chest Wo Contrast    complete in 3 months    Standing Status:   Future    Standing Expiration Date:   06/14/2021    Order Specific Question:   Preferred imaging location?    Answer:   [facility]    Order Specific Question:   Radiology Contrast Protocol - do NOT remove file path    Answer:   [email or web address]   Orders Placed This Encounter  Procedures  . CT Chest Wo Contrast   No orders of the defined types were placed in this encounter.   Follow Up:    Return in about 3 months (around 09/13/2020), or if symptoms worsen or fail to improve, for [facility] - Dr. Patsey Berthold, [facility] - Dr. Mortimer Fries.  Needs 49-month follow-up visit with [place] pulmonologist, not APP  Notification of test results are managed in the following manner: If there are  any recommendations or changes to the  plan of care discussed in office today,  we will contact you and let you know what they are. If you do not hear from [place], then your results are normal and you can view them through your  MyChart account , or a letter will be sent to you. Thank you again for trusting [place] with your care  - Thank you, [facility]    It is flu season:   >>>  Best ways to protect herself from the flu: Receive the yearly flu vaccine, practice good hand hygiene washing with soap and also using hand sanitizer when available, eat a nutritious meals, get adequate rest, hydrate appropriately       Please contact the office if your symptoms worsen or you have concerns that you are not improving.   Thank you for choosing [facility] for your healthcare, and for allowing [place] to partner with you on your healthcare journey. I am thankful to be able to provide care to you today.   Wyn Quaker FNP-C

## 2020-06-14 NOTE — Assessment & Plan Note (Signed)
GGO seen on CT of chest earlier this month Nonspecific findings  Plan: We will repeat CT of chest in 3 months with follow-up with the pulmonologist in our office

## 2020-06-15 ENCOUNTER — Inpatient Hospital Stay: Payer: Medicare Other | Admitting: Family Medicine

## 2020-06-16 DIAGNOSIS — J9811 Atelectasis: Secondary | ICD-10-CM | POA: Diagnosis not present

## 2020-06-16 DIAGNOSIS — I517 Cardiomegaly: Secondary | ICD-10-CM | POA: Diagnosis not present

## 2020-06-16 DIAGNOSIS — I672 Cerebral atherosclerosis: Secondary | ICD-10-CM | POA: Diagnosis not present

## 2020-06-16 DIAGNOSIS — J3801 Paralysis of vocal cords and larynx, unilateral: Secondary | ICD-10-CM | POA: Diagnosis not present

## 2020-06-17 DIAGNOSIS — G610 Guillain-Barre syndrome: Secondary | ICD-10-CM | POA: Diagnosis not present

## 2020-06-17 DIAGNOSIS — R490 Dysphonia: Secondary | ICD-10-CM | POA: Diagnosis not present

## 2020-06-17 DIAGNOSIS — R2689 Other abnormalities of gait and mobility: Secondary | ICD-10-CM | POA: Diagnosis not present

## 2020-06-17 DIAGNOSIS — R279 Unspecified lack of coordination: Secondary | ICD-10-CM | POA: Diagnosis not present

## 2020-06-17 DIAGNOSIS — M6281 Muscle weakness (generalized): Secondary | ICD-10-CM | POA: Diagnosis not present

## 2020-06-18 ENCOUNTER — Ambulatory Visit
Admission: RE | Admit: 2020-06-18 | Discharge: 2020-06-18 | Disposition: A | Discharge status: Home / Self Care | Payer: Medicare Other | Source: Ambulatory Visit | Attending: Family Medicine | Admitting: Family Medicine

## 2020-06-18 ENCOUNTER — Other Ambulatory Visit: Payer: Self-pay

## 2020-06-18 DIAGNOSIS — Z1231 Encounter for screening mammogram for malignant neoplasm of breast: Secondary | ICD-10-CM | POA: Diagnosis not present

## 2020-06-18 NOTE — Progress Notes (Signed)
Agree with the details of the visit as noted below by Wyn Quaker, NP.  She would benefit from follow-up for her sleep issues with physicians.  Renold Don, MD [facility]

## 2020-06-21 ENCOUNTER — Ambulatory Visit (INDEPENDENT_AMBULATORY_CARE_PROVIDER_SITE_OTHER): Payer: Medicare Other | Admitting: Family Medicine

## 2020-06-21 ENCOUNTER — Encounter: Payer: Self-pay | Admitting: Family Medicine

## 2020-06-21 ENCOUNTER — Other Ambulatory Visit: Payer: Self-pay

## 2020-06-21 VITALS — BP 120/80 | HR 82 | Temp 98.6°F | Ht 66.0 in | Wt 169.0 lb

## 2020-06-21 DIAGNOSIS — G610 Guillain-Barre syndrome: Secondary | ICD-10-CM

## 2020-06-21 DIAGNOSIS — K824 Cholesterolosis of gallbladder: Secondary | ICD-10-CM

## 2020-06-21 DIAGNOSIS — R600 Localized edema: Secondary | ICD-10-CM

## 2020-06-21 DIAGNOSIS — J3800 Paralysis of vocal cords and larynx, unspecified: Secondary | ICD-10-CM

## 2020-06-21 DIAGNOSIS — J189 Pneumonia, unspecified organism: Secondary | ICD-10-CM | POA: Diagnosis not present

## 2020-06-21 DIAGNOSIS — I10 Essential (primary) hypertension: Secondary | ICD-10-CM

## 2020-06-21 LAB — COMPREHENSIVE METABOLIC PANEL
ALT: 13 U/L (ref 0–35)
AST: 15 U/L (ref 0–37)
Albumin: 4 g/dL (ref 3.5–5.2)
Alkaline Phosphatase: 92 U/L (ref 39–117)
BUN: 10 mg/dL (ref 6–23)
CO2: 25 mEq/L (ref 19–32)
Calcium: 9.3 mg/dL (ref 8.4–10.5)
Chloride: 104 mEq/L (ref 96–112)
Creatinine, Ser: 0.46 mg/dL (ref 0.40–1.20)
GFR: 130.5 mL/min (ref >60.00)
Glucose, Bld: 82 mg/dL (ref 70–99)
Potassium: 4 mEq/L (ref 3.5–5.1)
Sodium: 137 mEq/L (ref 135–145)
Total Bilirubin: 0.4 mg/dL (ref 0.2–1.2)
Total Protein: 7.4 g/dL (ref 6.0–8.3)

## 2020-06-21 LAB — CBC
HCT: 37.1 % (ref 36.0–46.0)
Hemoglobin: 12.2 g/dL (ref 12.0–15.0)
MCHC: 32.8 g/dL (ref 30.0–36.0)
MCV: 91.1 fl (ref 78.0–100.0)
Platelets: 252 10*3/uL (ref 150.0–400.0)
RBC: 4.07 Mil/uL (ref 3.87–5.11)
RDW: 13.5 % (ref 11.5–15.5)
WBC: 5.7 10*3/uL (ref 4.0–10.5)

## 2020-06-21 LAB — TSH: TSH: 1.2 u[IU]/mL (ref 0.35–4.50)

## 2020-06-21 NOTE — Patient Instructions (Signed)
Nice to see you. We will get lab work today to evaluate for cause of your swelling. Please continue to work with physical therapy, Occupational Therapy, and speech therapy. Please stop the Protonix. If you develop any worsening numbness, tingling, or weakness please seek medical attention immediately.

## 2020-06-21 NOTE — Progress Notes (Signed)
Tommi Rumps, MD Phone: [phone]  Alison Huffman is a 79 y.o. female who presents today for follow-up.  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  She had developed progressive tingling and numbness and then subsequently developed weakness that was ascending.  Her diagnosis was initially up in the air though they eventually settled on GBS as a cause likely related to her Enbrel.  She was treated with IVIG as well as plasmapheresis.  She had voiding issues as well as swallowing issues.  She was treated with a feeding tube and subsequently had her diet advanced.  She had a Foley catheter in place though subsequently that was advanced back to normal voiding.  She had hyponatremia during her hospitalization and had a seizure related to that resulting in intubation.  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  She had a sacral wound that she reports is healed up.  She completed rehab and physical therapy at the skilled nursing facility.  She has been set up with home health physical therapy, Occupational Therapy, and speech therapy.  She is now using a Rollator though they are also try to get her to use a cane.  She has followed up with physical medicine and rehab and they recommended that she not get any vaccines moving forward.  She also has follow-up with neurology in November.  She notes overall she is quite a bit improved.  Her numbness and tingling has improved significantly.  Weakness is improving as well.  She is ambulating much better than she had.  She has had some increased swelling in her bilateral legs that does go down at night.  No orthopnea or PND.  No exertional dyspnea.  She does have some dyspnea when talking and notes that she has been advised that that is related to her vocal cord paralysis.  During her  hospitalization they did note a gallbladder polyp that needs an ultrasound to follow-up on this in 1 year.  She has also been on Protonix and wonders if she needs to remain on this.  Hypertension: Blood pressures have been up and down.  She has not been on any blood pressure medicine since discharge from the hospital.  Have ranged anywhere from the 120s to 160s over 60s to 80s.  Social History   Tobacco Use  Smoking Status Never Smoker  Smokeless Tobacco Never Used  Tobacco Comment   07/22/2018 "smoked some in college; 1960s;  nothing since"     ROS see history of present illness  Objective  Physical Exam Vitals:   06/21/20 1150  BP: 120/80  Pulse: 82  Temp: 98.6 F (37 C)  SpO2: 98%    BP Readings from Last 3 Encounters:  06/21/20 120/80  06/14/20 130/70  05/28/20 131/73   Wt Readings from Last 3 Encounters:  06/21/20 169 lb (76.7 kg)  06/14/20 169 lb 12.8 oz (77 kg)  05/28/20 168 lb (76.2 kg)    Physical Exam Constitutional:      General: She is not in acute distress.    Appearance: She is not diaphoretic.  Cardiovascular:     Rate and Rhythm: Normal rate and regular rhythm.     Heart sounds: Normal heart sounds.  Pulmonary:     Effort: Pulmonary effort is normal.     Breath sounds: Normal breath sounds.  Musculoskeletal:  Comments: Trace pitting edema to mid shin bilaterally  Skin:    General: Skin is warm and dry.  Neurological:     Mental Status: She is alert.     Comments: 5/5 strength in bilateral biceps, triceps, grip, quads, hamstrings, plantar and dorsiflexion, sensation to light touch intact in bilateral UE and LE, gait is quite good with use of Rollator, she is able to stand on her own from a seated position in a chair      Assessment/Plan: Please see individual problem list.  Guillain Barr syndrome ([place]) Continues to improve.  Neurologically her strength is intact and sensation is intact as well.  She will continue with physical therapy,  Occupational Therapy, and speech therapy.  She will follow up with physical medicine and rehab as planned.  We will see if we can get her neurology appointment moved up.  Message sent to referral coordinator regarding that.  Patient will seek medical attention for any worsening symptoms.  Discussed at this time she could discontinue the Protonix.  Suspect she was on this for gastric protection while intubated and on steroids.  Hypertension Above goal.  We will check lab work and then determine what medication we can restart.  Bilateral leg edema Likely venous insufficiency related though we will check lab work to rule out other underlying causes.  Unlikely related to CHF as a cause given lack of other symptoms.  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  She will continue to see ENT.  HCAP (healthcare-associated pneumonia) Patient had this while she was in the hospital.  She is recovered well.  Lungs are clear.  She has seen pulmonology for follow-up and plan is to repeat his CT scan in 3 months.  Gallbladder polyp Needs repeat ultrasound July 2022.   Health Maintenance: Discussed that she should not complete any vaccines in the near future.  Discussed she should not have the flu vaccine moving forward at all.  At some point she may be able to have the COVID-19 vaccine booster though given that she is still in the acute phase I would hold off on this at this time even if they approve it for use.  Orders Placed This Encounter  Procedures  . Comp Met (CMET)  . CBC  . TSH    No orders of the defined types were placed in this encounter.   Alison Huffman was seen today for hospitalization follow-up.  Diagnoses and all orders for this visit:  Guillain Barr syndrome ([place])  Bilateral leg edema -     Comp Met (CMET) -     CBC -     TSH  Essential hypertension  Vocal cord paresis  HCAP (healthcare-associated pneumonia)  Gallbladder polyp     This visit  occurred during the SARS-CoV-2 public health emergency.  Safety protocols were in place, including screening questions prior to the visit, additional usage of staff PPE, and extensive cleaning of exam room while observing appropriate contact time as indicated for disinfecting solutions.    Tommi Rumps, MD [place]

## 2020-06-22 ENCOUNTER — Telehealth: Payer: Self-pay | Admitting: Family Medicine

## 2020-06-22 DIAGNOSIS — M6281 Muscle weakness (generalized): Secondary | ICD-10-CM | POA: Diagnosis not present

## 2020-06-22 DIAGNOSIS — R279 Unspecified lack of coordination: Secondary | ICD-10-CM | POA: Diagnosis not present

## 2020-06-22 DIAGNOSIS — R2689 Other abnormalities of gait and mobility: Secondary | ICD-10-CM | POA: Diagnosis not present

## 2020-06-22 DIAGNOSIS — J189 Pneumonia, unspecified organism: Secondary | ICD-10-CM | POA: Insufficient documentation

## 2020-06-22 DIAGNOSIS — R600 Localized edema: Secondary | ICD-10-CM | POA: Insufficient documentation

## 2020-06-22 DIAGNOSIS — K824 Cholesterolosis of gallbladder: Secondary | ICD-10-CM | POA: Insufficient documentation

## 2020-06-22 DIAGNOSIS — R490 Dysphonia: Secondary | ICD-10-CM | POA: Diagnosis not present

## 2020-06-22 DIAGNOSIS — G610 Guillain-Barre syndrome: Secondary | ICD-10-CM | POA: Diagnosis not present

## 2020-06-22 NOTE — Assessment & Plan Note (Signed)
She will continue to work with speech therapy.  She will continue to see ENT.

## 2020-06-22 NOTE — Assessment & Plan Note (Addendum)
Continues to improve.  Neurologically her strength is intact and sensation is intact as well.  She will continue with physical therapy, Occupational Therapy, and speech therapy.  She will follow up with physical medicine and rehab as planned.  We will see if we can get her neurology appointment moved up.  Message sent to referral coordinator regarding that.  Patient will seek medical attention for any worsening symptoms.  Discussed at this time she could discontinue the Protonix.  Suspect she was on this for gastric protection while intubated and on steroids.

## 2020-06-22 NOTE — Assessment & Plan Note (Signed)
Likely venous insufficiency related though we will check lab work to rule out other underlying causes.  Unlikely related to CHF as a cause given lack of other symptoms.

## 2020-06-22 NOTE — Telephone Encounter (Signed)
I lft pt vm to inform her that [facility] neurology does not have a sooner appt at this time and pt was put on the wait list.

## 2020-06-22 NOTE — Assessment & Plan Note (Addendum)
Patient had this while she was in the hospital.  She is recovered well.  Lungs are clear.  She has seen pulmonology for follow-up and plan is to repeat his CT scan in 3 months.

## 2020-06-22 NOTE — Assessment & Plan Note (Signed)
Needs repeat ultrasound July 2022.

## 2020-06-22 NOTE — Assessment & Plan Note (Signed)
Above goal.  We will check lab work and then determine what medication we can restart.

## 2020-06-23 DIAGNOSIS — R2689 Other abnormalities of gait and mobility: Secondary | ICD-10-CM | POA: Diagnosis not present

## 2020-06-23 DIAGNOSIS — R490 Dysphonia: Secondary | ICD-10-CM | POA: Diagnosis not present

## 2020-06-23 DIAGNOSIS — R279 Unspecified lack of coordination: Secondary | ICD-10-CM | POA: Diagnosis not present

## 2020-06-23 DIAGNOSIS — M6281 Muscle weakness (generalized): Secondary | ICD-10-CM | POA: Diagnosis not present

## 2020-06-23 DIAGNOSIS — G610 Guillain-Barre syndrome: Secondary | ICD-10-CM | POA: Diagnosis not present

## 2020-06-24 DIAGNOSIS — J3801 Paralysis of vocal cords and larynx, unilateral: Secondary | ICD-10-CM | POA: Diagnosis not present

## 2020-06-24 DIAGNOSIS — R490 Dysphonia: Secondary | ICD-10-CM | POA: Diagnosis not present

## 2020-06-25 DIAGNOSIS — M6281 Muscle weakness (generalized): Secondary | ICD-10-CM | POA: Diagnosis not present

## 2020-06-25 DIAGNOSIS — R2689 Other abnormalities of gait and mobility: Secondary | ICD-10-CM | POA: Diagnosis not present

## 2020-06-25 DIAGNOSIS — R490 Dysphonia: Secondary | ICD-10-CM | POA: Diagnosis not present

## 2020-06-25 DIAGNOSIS — G610 Guillain-Barre syndrome: Secondary | ICD-10-CM | POA: Diagnosis not present

## 2020-06-25 DIAGNOSIS — R279 Unspecified lack of coordination: Secondary | ICD-10-CM | POA: Diagnosis not present

## 2020-06-29 DIAGNOSIS — M6281 Muscle weakness (generalized): Secondary | ICD-10-CM | POA: Diagnosis not present

## 2020-06-29 DIAGNOSIS — G610 Guillain-Barre syndrome: Secondary | ICD-10-CM | POA: Diagnosis not present

## 2020-06-29 DIAGNOSIS — R490 Dysphonia: Secondary | ICD-10-CM | POA: Diagnosis not present

## 2020-06-29 DIAGNOSIS — R279 Unspecified lack of coordination: Secondary | ICD-10-CM | POA: Diagnosis not present

## 2020-06-29 DIAGNOSIS — R2689 Other abnormalities of gait and mobility: Secondary | ICD-10-CM | POA: Diagnosis not present

## 2020-06-30 DIAGNOSIS — M6281 Muscle weakness (generalized): Secondary | ICD-10-CM | POA: Diagnosis not present

## 2020-06-30 DIAGNOSIS — R279 Unspecified lack of coordination: Secondary | ICD-10-CM | POA: Diagnosis not present

## 2020-06-30 DIAGNOSIS — R490 Dysphonia: Secondary | ICD-10-CM | POA: Diagnosis not present

## 2020-06-30 DIAGNOSIS — G610 Guillain-Barre syndrome: Secondary | ICD-10-CM | POA: Diagnosis not present

## 2020-06-30 DIAGNOSIS — R2689 Other abnormalities of gait and mobility: Secondary | ICD-10-CM | POA: Diagnosis not present

## 2020-07-01 DIAGNOSIS — G610 Guillain-Barre syndrome: Secondary | ICD-10-CM | POA: Diagnosis not present

## 2020-07-01 DIAGNOSIS — R279 Unspecified lack of coordination: Secondary | ICD-10-CM | POA: Diagnosis not present

## 2020-07-01 DIAGNOSIS — R2689 Other abnormalities of gait and mobility: Secondary | ICD-10-CM | POA: Diagnosis not present

## 2020-07-01 DIAGNOSIS — M6281 Muscle weakness (generalized): Secondary | ICD-10-CM | POA: Diagnosis not present

## 2020-07-01 DIAGNOSIS — R490 Dysphonia: Secondary | ICD-10-CM | POA: Diagnosis not present

## 2020-07-05 DIAGNOSIS — G610 Guillain-Barre syndrome: Secondary | ICD-10-CM | POA: Diagnosis not present

## 2020-07-05 DIAGNOSIS — R490 Dysphonia: Secondary | ICD-10-CM | POA: Diagnosis not present

## 2020-07-05 DIAGNOSIS — M6281 Muscle weakness (generalized): Secondary | ICD-10-CM | POA: Diagnosis not present

## 2020-07-05 DIAGNOSIS — R279 Unspecified lack of coordination: Secondary | ICD-10-CM | POA: Diagnosis not present

## 2020-07-05 DIAGNOSIS — R2689 Other abnormalities of gait and mobility: Secondary | ICD-10-CM | POA: Diagnosis not present

## 2020-07-06 DIAGNOSIS — G610 Guillain-Barre syndrome: Secondary | ICD-10-CM | POA: Diagnosis not present

## 2020-07-06 DIAGNOSIS — R2689 Other abnormalities of gait and mobility: Secondary | ICD-10-CM | POA: Diagnosis not present

## 2020-07-06 DIAGNOSIS — R490 Dysphonia: Secondary | ICD-10-CM | POA: Diagnosis not present

## 2020-07-06 DIAGNOSIS — R279 Unspecified lack of coordination: Secondary | ICD-10-CM | POA: Diagnosis not present

## 2020-07-06 DIAGNOSIS — M6281 Muscle weakness (generalized): Secondary | ICD-10-CM | POA: Diagnosis not present

## 2020-07-08 DIAGNOSIS — R279 Unspecified lack of coordination: Secondary | ICD-10-CM | POA: Diagnosis not present

## 2020-07-08 DIAGNOSIS — R490 Dysphonia: Secondary | ICD-10-CM | POA: Diagnosis not present

## 2020-07-08 DIAGNOSIS — G610 Guillain-Barre syndrome: Secondary | ICD-10-CM | POA: Diagnosis not present

## 2020-07-08 DIAGNOSIS — R2689 Other abnormalities of gait and mobility: Secondary | ICD-10-CM | POA: Diagnosis not present

## 2020-07-08 DIAGNOSIS — M6281 Muscle weakness (generalized): Secondary | ICD-10-CM | POA: Diagnosis not present

## 2020-07-09 DIAGNOSIS — G610 Guillain-Barre syndrome: Secondary | ICD-10-CM | POA: Diagnosis not present

## 2020-07-09 DIAGNOSIS — R2689 Other abnormalities of gait and mobility: Secondary | ICD-10-CM | POA: Diagnosis not present

## 2020-07-09 DIAGNOSIS — R490 Dysphonia: Secondary | ICD-10-CM | POA: Diagnosis not present

## 2020-07-09 DIAGNOSIS — M6281 Muscle weakness (generalized): Secondary | ICD-10-CM | POA: Diagnosis not present

## 2020-07-09 DIAGNOSIS — R279 Unspecified lack of coordination: Secondary | ICD-10-CM | POA: Diagnosis not present

## 2020-07-12 DIAGNOSIS — R2689 Other abnormalities of gait and mobility: Secondary | ICD-10-CM | POA: Diagnosis not present

## 2020-07-12 DIAGNOSIS — M6281 Muscle weakness (generalized): Secondary | ICD-10-CM | POA: Diagnosis not present

## 2020-07-12 DIAGNOSIS — R279 Unspecified lack of coordination: Secondary | ICD-10-CM | POA: Diagnosis not present

## 2020-07-12 DIAGNOSIS — G610 Guillain-Barre syndrome: Secondary | ICD-10-CM | POA: Diagnosis not present

## 2020-07-12 DIAGNOSIS — R490 Dysphonia: Secondary | ICD-10-CM | POA: Diagnosis not present

## 2020-07-13 DIAGNOSIS — R279 Unspecified lack of coordination: Secondary | ICD-10-CM | POA: Diagnosis not present

## 2020-07-13 DIAGNOSIS — R490 Dysphonia: Secondary | ICD-10-CM | POA: Diagnosis not present

## 2020-07-13 DIAGNOSIS — M6281 Muscle weakness (generalized): Secondary | ICD-10-CM | POA: Diagnosis not present

## 2020-07-13 DIAGNOSIS — G610 Guillain-Barre syndrome: Secondary | ICD-10-CM | POA: Diagnosis not present

## 2020-07-13 DIAGNOSIS — R2689 Other abnormalities of gait and mobility: Secondary | ICD-10-CM | POA: Diagnosis not present

## 2020-07-14 ENCOUNTER — Ambulatory Visit (INDEPENDENT_AMBULATORY_CARE_PROVIDER_SITE_OTHER): Payer: Medicare Other

## 2020-07-14 DIAGNOSIS — R2689 Other abnormalities of gait and mobility: Secondary | ICD-10-CM | POA: Diagnosis not present

## 2020-07-14 DIAGNOSIS — G610 Guillain-Barre syndrome: Secondary | ICD-10-CM | POA: Diagnosis not present

## 2020-07-14 DIAGNOSIS — I441 Atrioventricular block, second degree: Secondary | ICD-10-CM

## 2020-07-14 DIAGNOSIS — M6281 Muscle weakness (generalized): Secondary | ICD-10-CM | POA: Diagnosis not present

## 2020-07-14 DIAGNOSIS — R279 Unspecified lack of coordination: Secondary | ICD-10-CM | POA: Diagnosis not present

## 2020-07-14 DIAGNOSIS — R490 Dysphonia: Secondary | ICD-10-CM | POA: Diagnosis not present

## 2020-07-15 DIAGNOSIS — G610 Guillain-Barre syndrome: Secondary | ICD-10-CM | POA: Diagnosis not present

## 2020-07-15 DIAGNOSIS — R2689 Other abnormalities of gait and mobility: Secondary | ICD-10-CM | POA: Diagnosis not present

## 2020-07-15 DIAGNOSIS — R279 Unspecified lack of coordination: Secondary | ICD-10-CM | POA: Diagnosis not present

## 2020-07-15 DIAGNOSIS — R490 Dysphonia: Secondary | ICD-10-CM | POA: Diagnosis not present

## 2020-07-15 DIAGNOSIS — M6281 Muscle weakness (generalized): Secondary | ICD-10-CM | POA: Diagnosis not present

## 2020-07-16 DIAGNOSIS — R279 Unspecified lack of coordination: Secondary | ICD-10-CM | POA: Diagnosis not present

## 2020-07-16 DIAGNOSIS — R2689 Other abnormalities of gait and mobility: Secondary | ICD-10-CM | POA: Diagnosis not present

## 2020-07-16 DIAGNOSIS — R490 Dysphonia: Secondary | ICD-10-CM | POA: Diagnosis not present

## 2020-07-16 DIAGNOSIS — G610 Guillain-Barre syndrome: Secondary | ICD-10-CM | POA: Diagnosis not present

## 2020-07-16 DIAGNOSIS — M6281 Muscle weakness (generalized): Secondary | ICD-10-CM | POA: Diagnosis not present

## 2020-07-16 LAB — CUP PACEART REMOTE DEVICE CHECK
Battery Remaining Longevity: 133 mo
Battery Remaining Percentage: 95.5 %
Battery Voltage: 3.01 V
Brady Statistic AP VP Percent: 1 %
Brady Statistic AP VS Percent: 6.5 %
Brady Statistic AS VP Percent: 1 %
Brady Statistic AS VS Percent: 89 %
Brady Statistic RA Percent Paced: 1.4 %
Brady Statistic RV Percent Paced: 1 %
Date Time Interrogation Session: 20211013020012
Implantable Lead Implant Date: 20191022
Implantable Lead Implant Date: 20191022
Implantable Lead Location: 753859
Implantable Lead Location: 753860
Implantable Pulse Generator Implant Date: 20191022
Lead Channel Impedance Value: 590 Ohm
Lead Channel Impedance Value: 730 Ohm
Lead Channel Pacing Threshold Amplitude: 0.5 V
Lead Channel Pacing Threshold Amplitude: 0.625 V
Lead Channel Pacing Threshold Pulse Width: 0.5 ms
Lead Channel Pacing Threshold Pulse Width: 0.5 ms
Lead Channel Sensing Intrinsic Amplitude: 12 mV
Lead Channel Sensing Intrinsic Amplitude: 3.9 mV
Lead Channel Setting Pacing Amplitude: 0.875
Lead Channel Setting Pacing Amplitude: 2 V
Lead Channel Setting Pacing Pulse Width: 0.5 ms
Lead Channel Setting Sensing Sensitivity: 2 mV
Pulse Gen Model: 2272
Pulse Gen Serial Number: 9073848

## 2020-07-19 DIAGNOSIS — G610 Guillain-Barre syndrome: Secondary | ICD-10-CM | POA: Diagnosis not present

## 2020-07-19 DIAGNOSIS — R279 Unspecified lack of coordination: Secondary | ICD-10-CM | POA: Diagnosis not present

## 2020-07-19 DIAGNOSIS — R490 Dysphonia: Secondary | ICD-10-CM | POA: Diagnosis not present

## 2020-07-19 DIAGNOSIS — R2689 Other abnormalities of gait and mobility: Secondary | ICD-10-CM | POA: Diagnosis not present

## 2020-07-19 DIAGNOSIS — M6281 Muscle weakness (generalized): Secondary | ICD-10-CM | POA: Diagnosis not present

## 2020-07-19 NOTE — Progress Notes (Signed)
Remote pacemaker transmission.   

## 2020-07-20 DIAGNOSIS — M6281 Muscle weakness (generalized): Secondary | ICD-10-CM | POA: Diagnosis not present

## 2020-07-20 DIAGNOSIS — R490 Dysphonia: Secondary | ICD-10-CM | POA: Diagnosis not present

## 2020-07-20 DIAGNOSIS — R2689 Other abnormalities of gait and mobility: Secondary | ICD-10-CM | POA: Diagnosis not present

## 2020-07-20 DIAGNOSIS — G610 Guillain-Barre syndrome: Secondary | ICD-10-CM | POA: Diagnosis not present

## 2020-07-20 DIAGNOSIS — R279 Unspecified lack of coordination: Secondary | ICD-10-CM | POA: Diagnosis not present

## 2020-07-22 DIAGNOSIS — R490 Dysphonia: Secondary | ICD-10-CM | POA: Diagnosis not present

## 2020-07-22 DIAGNOSIS — M6281 Muscle weakness (generalized): Secondary | ICD-10-CM | POA: Diagnosis not present

## 2020-07-22 DIAGNOSIS — G610 Guillain-Barre syndrome: Secondary | ICD-10-CM | POA: Diagnosis not present

## 2020-07-22 DIAGNOSIS — R279 Unspecified lack of coordination: Secondary | ICD-10-CM | POA: Diagnosis not present

## 2020-07-22 DIAGNOSIS — R2689 Other abnormalities of gait and mobility: Secondary | ICD-10-CM | POA: Diagnosis not present

## 2020-07-23 DIAGNOSIS — G610 Guillain-Barre syndrome: Secondary | ICD-10-CM | POA: Diagnosis not present

## 2020-07-23 DIAGNOSIS — M6281 Muscle weakness (generalized): Secondary | ICD-10-CM | POA: Diagnosis not present

## 2020-07-23 DIAGNOSIS — R490 Dysphonia: Secondary | ICD-10-CM | POA: Diagnosis not present

## 2020-07-23 DIAGNOSIS — R279 Unspecified lack of coordination: Secondary | ICD-10-CM | POA: Diagnosis not present

## 2020-07-23 DIAGNOSIS — R2689 Other abnormalities of gait and mobility: Secondary | ICD-10-CM | POA: Diagnosis not present

## 2020-07-26 ENCOUNTER — Encounter: Payer: Self-pay | Admitting: Physical Medicine and Rehabilitation

## 2020-07-26 ENCOUNTER — Other Ambulatory Visit: Payer: Self-pay

## 2020-07-26 ENCOUNTER — Encounter
Payer: Medicare Other | Attending: Physical Medicine and Rehabilitation | Admitting: Physical Medicine and Rehabilitation

## 2020-07-26 VITALS — BP 153/82 | HR 81 | Temp 98.0°F | Ht 66.0 in | Wt 172.4 lb

## 2020-07-26 DIAGNOSIS — G610 Guillain-Barre syndrome: Secondary | ICD-10-CM | POA: Diagnosis not present

## 2020-07-26 DIAGNOSIS — M6281 Muscle weakness (generalized): Secondary | ICD-10-CM | POA: Diagnosis not present

## 2020-07-26 DIAGNOSIS — J3800 Paralysis of vocal cords and larynx, unspecified: Secondary | ICD-10-CM | POA: Insufficient documentation

## 2020-07-26 DIAGNOSIS — R269 Unspecified abnormalities of gait and mobility: Secondary | ICD-10-CM | POA: Diagnosis not present

## 2020-07-26 NOTE — Progress Notes (Signed)
Subjective:    Patient ID: Alison Huffman, female    DOB: [birth year], 79 y.o.   MRN: [id number]  HPI   Patient is an 79 yr old female with Guillain Barre syndrome- hx of OSA, dCHF, chest tubes due to B/L pneumothorax due to Code blue/being bagged, seizure, hyper and hyponatremia- acute; S/P IVIG and plasmapharesis.  As well as R vocal cord paralysis. Now eating regular diet! And dysphagia has resolved.    Is home [place] home 9/14 Stayed 5 extra days- to make sure was ready to leave.   Doing well with Rollator- ever since went to SNF.  Started with a cane- going well-  Walking pretty long distances. Walking with someone when walks with cane Use Rollator still around apartment due to easier to carry things.   Going to pool x3 weeks- 1x/week-  PT and OT 2x/week and SLP 1xweek.    Right now, having pain in hands- burning pain just L hand.  Started 2-3 weeks ago.   Most of time, doesn't have enough pain to take something, but with therapy, sometimes takes tylenol in evening.  Still has burning/tingling in toes/feet- doesn't bother when walking.   Also has swelling/tingling in hands- hard to open can, grip ball, etc- worse than before GBS- (due to swelling, etc).   Played bridge for first time last night- could hold cards. The whole time and pick then up. Didn't shuffle though.   Still has swelling in legs- some days worse than others.   Don't go out much, but went to [employer] last week.   Has someone to help in the mornings- so helps with washing, helps with dishes, makes bed; there in case she falls during shower- helps her dry off.   Saw PA from Pulmonary- due to OSA- to see Pulmonary physician in 2 months.   ENT- paralysis of R vocal cord.  Because voice was sounding better, waited for injections- to see 11/23- Dr Pryor Ochoa.   Goes weeks and doesn't take anything at all.    Pain Inventory Average Pain 5 Pain Right Now 2 My pain is intermittent, burning and  stabbing  LOCATION OF PAIN  On top of left hand.   BOWEL Number of stools per week: 5 Oral laxative use Yes  Type of laxative Citrucel Enema or suppository use No  History of colostomy No  Incontinent No   BLADDER Normal In and out cath, frequency N/A Able to self cath N/A Bladder incontinence No  Frequent urination No  Leakage with coughing No  Difficulty starting stream No  Incomplete bladder emptying No    Mobility use a cane use a walker how many minutes can you walk? 10-15 MINS ability to climb steps?  yes do you drive?  no Do you have any goals in this area?  yes  Function retired I need assistance with the following:  bathing, household duties and shopping Do you have any goals in this area?  yes  Neuro/Psych weakness numbness tingling trouble walking  Prior Studies bone scan CT of Neck & Upper Chest  Physicians involved in your care Any changes since last visit?  yes [facility] - Dr. Pryor Ochoa   Family History  Problem Relation Age of Onset  . Stroke Mother   . Atrial fibrillation Mother   . Breast cancer Mother   . Stroke Father   . Breast cancer Maternal Aunt   . Breast cancer Paternal Aunt    Social History   Socioeconomic History  .  Marital status: Single    Spouse name: Not on file  . Number of children: Not on file  . Years of education: Not on file  . Highest education level: Not on file  Occupational History  . Not on file  Tobacco Use  . Smoking status: Never Smoker  . Smokeless tobacco: Never Used  . Tobacco comment: 07/22/2018 "smoked some in college; 1960s;  nothing since"  Vaping Use  . Vaping Use: Never used  Substance and Sexual Activity  . Alcohol use: Yes    Comment: rarely  . Drug use: Never  . Sexual activity: Not Currently    Birth control/protection: Post-menopausal  Other Topics Concern  . Not on file  Social History Narrative  . Not on file   Social Determinants of Health   Financial Resource  Strain:   . Difficulty of Paying Living Expenses: Not on file  Food Insecurity:   . Worried About Charity fundraiser in the Last Year: Not on file  . Ran Out of Food in the Last Year: Not on file  Transportation Needs:   . Lack of Transportation (Medical): Not on file  . Lack of Transportation (Non-Medical): Not on file  Physical Activity:   . Days of Exercise per Week: Not on file  . Minutes of Exercise per Session: Not on file  Stress:   . Feeling of Stress : Not on file  Social Connections:   . Frequency of Communication with Friends and Family: Not on file  . Frequency of Social Gatherings with Friends and Family: Not on file  . Attends Religious Services: Not on file  . Active Member of Clubs or Organizations: Not on file  . Attends Archivist Meetings: Not on file  . Marital Status: Not on file   Past Surgical History:  Procedure Laterality Date  . APPENDECTOMY    . BREAST CYST ASPIRATION Bilateral   . BREAST CYST EXCISION Right 1978   benign  . COLONOSCOPY    . COLONOSCOPY WITH PROPOFOL N/A 07/10/2019   Procedure: COLONOSCOPY WITH PROPOFOL;  Surgeon: Lollie Sails, MD;  Location: [facility] ENDOSCOPY;  Service: Endoscopy;  Laterality: N/A;  . ESOPHAGOGASTRODUODENOSCOPY (EGD) WITH PROPOFOL N/A 07/10/2019   Procedure: ESOPHAGOGASTRODUODENOSCOPY (EGD) WITH PROPOFOL;  Surgeon: Lollie Sails, MD;  Location: [facility] ENDOSCOPY;  Service: Endoscopy;  Laterality: N/A;  . HYSTEROSCOPY WITH D & C N/A 11/05/2018   Procedure: DILATATION AND CURETTAGE /HYSTEROSCOPY;  Surgeon: Homero Fellers, MD;  Location: [facility] ORS;  Service: Gynecology;  Laterality: N/A;  . INSERT / REPLACE / REMOVE PACEMAKER    . 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 UTERINE POLYPS REMOVAL     Past Medical History:  Diagnosis Date  . Ankle pain, right   . Atypical chest pain    a. 10/2018 MV: EF 57%. No ischemia/infarct.   . Coronary artery disease   . Diastolic dysfunction    a. 04/2018 Echo: EF 55-60%, no rwma, Gr1 DD. Triv AI, mild MR. Mod dil LA. Nl RV fxn. PASP nl.  . GERD (gastroesophageal reflux disease)   . Headache    "dull one sometimes daily, at least weekly in last couple months" (07/22/2018)  . Hyperlipidemia   . Hypertension   . 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 Joint pain in fingers of right hand   . Lichen sclerosus   . Osteopenia   . Pneumonia  2012? X 1  . 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 Psoriasis   . Psoriatic arthritis ([place])    " dx'd the 1st of this year, 2019" (07/22/2018)  . Psoriatic arthritis ([place])   . Rosacea   . Seborrheic keratosis   . Second degree heart block    a. 07/2018 s/p SJM Assurity MRI model PM2271 (Ser# [id number]).  . Sleep apnea    "using nasal pilllows since ~ 12/2017" (07/22/2018)  . TMJ (dislocation of temporomandibular joint)    BP (!) 153/82   Pulse 81   Temp 98 F (36.7 C)   Ht 5\' 6"  (1.676 m)   Wt 172 lb 6.4 oz (78.2 kg)   SpO2 96%   BMI 27.83 kg/m   Opioid Risk Score:   Fall Risk Score:  `1  Depression screen PHQ 2/9  Depression screen [facility] 2/9 06/21/2020 05/28/2020 03/24/2020 09/22/2019 09/19/2019 09/20/2018 09/06/2018  Decreased Interest 0 0 0 0 0 0 0  Down, Depressed, Hopeless 0 0 0 0 0 0 0  PHQ - 2 Score 0 0 0 0 0 0 0  Altered sleeping - 1 - - - - 0  Tired, decreased energy - 1 - - - - 1  Change in appetite - 0 - - - - 1  Feeling bad or failure about yourself  - 0 - - - - 0  Trouble concentrating - 0 - - - - 0  Moving slowly or fidgety/restless - 0 - - - - 0  Suicidal thoughts - 0 - - - - 0  PHQ-9 Score - 2 - - - - 2  Difficult doing work/chores - Not difficult at all - - - - Not difficult at all   Review of Systems  Constitutional: Negative.   HENT: Negative.   Eyes: Negative.   Respiratory: Negative.   Cardiovascular: Negative.   Gastrointestinal: Negative.   Endocrine: Negative.   Genitourinary: Negative.    Musculoskeletal: Positive for gait problem.       Left hand pain  Skin: Negative.   Neurological: Positive for weakness and numbness.  Hematological: Negative.   Psychiatric/Behavioral: Negative.   All other systems reviewed and are negative.      Objective:   Physical Exam  Awake, alert, appropriate, using rollator, NAD MS: UEs 5/5 in deltoid, biceps, triceps, WE< grip and finger abd B/L LEs- 5-/5 in HF, KE, KF, and L DF and PF 5-/5- R DF and PF 4+/5.        Assessment & Plan:   Patient is an 79 yr old female with Guillain Barre syndrome- hx of OSA, dCHF, chest tubes due to B/L pneumothorax due to Code blue/being bagged, seizure, hyper and hyponatremia- acute; S/P IVIG and plasmapharesis.  As well as R vocal cord paralysis. Now eating regular diet! And dysphagia has resolved.   1. Oxogrip- got at [employer]- to open cans/jars. Looks like a "Y" with a built up handle. To open jars/bottles.    2. Liver and gallbladder  U/S from 9-10 months from now- due to hepatic steatosis and sludge.   3. Voltaren gel-over the counter- can use up to 4x/day- anti-inflammatory/helps swelling more.  4. Lidocaine gel 4% over the counter (or can do prescription at higher dose)  Can use up to 3x/day- helps tingling/nerve pain better- patches great on flat areas!   5. F/U in 2 months - appointment with Neurology Monday   I spent a total of 30 minutes on visit as detailed above.

## 2020-07-26 NOTE — Patient Instructions (Signed)
Patient is an 79 yr old female with Guillain Barre syndrome- hx of OSA, dCHF, chest tubes due to B/L pneumothorax due to Code blue/being bagged, seizure, hyper and hyponatremia- acute; S/P IVIG and plasmapharesis.  As well as R vocal cord paralysis. Now eating regular diet! And dysphagia has resolved.   1. Oxogrip- got at [employer]- to open cans/jars. Looks like a "Y" with a built up handle. To open jars/bottles.    2. Liver and gallbladder  U/S from 9-10 months from now- due to hepatic steatosis and sludge.   3. Voltaren gel-over the counter- can use up to 4x/day- anti-inflammatory/helps swelling more.  4. Lidocaine gel 4% over the counter (or can do prescription at higher dose)  Can use up to 3x/day- helps tingling/nerve pain better- patches great on flat areas!   5. F/U in 2 months - appointment with Neurology Monday

## 2020-07-27 DIAGNOSIS — R2689 Other abnormalities of gait and mobility: Secondary | ICD-10-CM | POA: Diagnosis not present

## 2020-07-27 DIAGNOSIS — R490 Dysphonia: Secondary | ICD-10-CM | POA: Diagnosis not present

## 2020-07-27 DIAGNOSIS — R279 Unspecified lack of coordination: Secondary | ICD-10-CM | POA: Diagnosis not present

## 2020-07-27 DIAGNOSIS — M6281 Muscle weakness (generalized): Secondary | ICD-10-CM | POA: Diagnosis not present

## 2020-07-27 DIAGNOSIS — G610 Guillain-Barre syndrome: Secondary | ICD-10-CM | POA: Diagnosis not present

## 2020-07-28 DIAGNOSIS — R2689 Other abnormalities of gait and mobility: Secondary | ICD-10-CM | POA: Diagnosis not present

## 2020-07-28 DIAGNOSIS — R490 Dysphonia: Secondary | ICD-10-CM | POA: Diagnosis not present

## 2020-07-28 DIAGNOSIS — R279 Unspecified lack of coordination: Secondary | ICD-10-CM | POA: Diagnosis not present

## 2020-07-28 DIAGNOSIS — G610 Guillain-Barre syndrome: Secondary | ICD-10-CM | POA: Diagnosis not present

## 2020-07-28 DIAGNOSIS — M6281 Muscle weakness (generalized): Secondary | ICD-10-CM | POA: Diagnosis not present

## 2020-07-29 DIAGNOSIS — G610 Guillain-Barre syndrome: Secondary | ICD-10-CM | POA: Diagnosis not present

## 2020-07-29 DIAGNOSIS — R490 Dysphonia: Secondary | ICD-10-CM | POA: Diagnosis not present

## 2020-07-29 DIAGNOSIS — M6281 Muscle weakness (generalized): Secondary | ICD-10-CM | POA: Diagnosis not present

## 2020-07-29 DIAGNOSIS — R2689 Other abnormalities of gait and mobility: Secondary | ICD-10-CM | POA: Diagnosis not present

## 2020-07-29 DIAGNOSIS — R279 Unspecified lack of coordination: Secondary | ICD-10-CM | POA: Diagnosis not present

## 2020-07-30 DIAGNOSIS — R279 Unspecified lack of coordination: Secondary | ICD-10-CM | POA: Diagnosis not present

## 2020-07-30 DIAGNOSIS — M6281 Muscle weakness (generalized): Secondary | ICD-10-CM | POA: Diagnosis not present

## 2020-07-30 DIAGNOSIS — R490 Dysphonia: Secondary | ICD-10-CM | POA: Diagnosis not present

## 2020-07-30 DIAGNOSIS — G610 Guillain-Barre syndrome: Secondary | ICD-10-CM | POA: Diagnosis not present

## 2020-07-30 DIAGNOSIS — R2689 Other abnormalities of gait and mobility: Secondary | ICD-10-CM | POA: Diagnosis not present

## 2020-08-02 ENCOUNTER — Other Ambulatory Visit: Payer: Self-pay

## 2020-08-02 ENCOUNTER — Encounter: Payer: Self-pay | Admitting: Neurology

## 2020-08-02 ENCOUNTER — Ambulatory Visit (INDEPENDENT_AMBULATORY_CARE_PROVIDER_SITE_OTHER): Payer: Medicare Other | Admitting: Neurology

## 2020-08-02 VITALS — BP 170/83 | HR 74 | Ht 66.0 in | Wt 170.0 lb

## 2020-08-02 DIAGNOSIS — G610 Guillain-Barre syndrome: Secondary | ICD-10-CM

## 2020-08-02 NOTE — Progress Notes (Signed)
[facility] Clinic Note - Initial Visit   Date: 08/02/20  Alison Huffman MRN: [id number] DOB: [birth year]   Dear Dr. Caryl Bis:  Thank you for your kind referral of Alison Huffman for consultation of Guillain Barre syndrome. Although her history is well known to you, please allow [place] to reiterate it for the purpose of our medical record. The patient was accompanied to the clinic by self.    History of Present Illness: Alison Huffman is a 79 y.o. 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  She presented to [facility] in June and transferred to [facility] with ascending paresthesias, proximal weakness (3/5 hip flexion, 3/5 shoulder extension), dysphagia, and respiratory failure about a week following treatment with Enbrel. She had a complex hospital course with hyponatremia-induced (Na 119) seizure, respiratory failure requiring resuscitation, bilateral pneumothorax, and right vocal cord paralysis.  Work-up included CSF testing which did not show albuminocytologic dissociation, but given high clinical suspicion was treated for Guillain Barre syndrome with IVIG but did not respond.  She responded to plasmapheresis x 7 session. MRI brain, cervical spine, and thoracic spine was unremarkable.  She slowly improved and discharged to rehab then SNF, where she continued to make improvements.  She is now walking with a rollator/cane and able to walk a few steps unassisted.  No dysarthria, dysphagia, double vision, or limb weakness.  She still have numbness/tingling in the lower legs/feet. She is now walking with a rollator and able to take steps unassisted.  No falls.   Out-side paper records, electronic medical record, and images have been reviewed where available and summarized as:  Lab Results  Component Value Date    HGBA1C 6.0 (H) 04/03/2020   Lab Results  Component Value Date   [id number] 643 03/27/2020   Lab Results  Component Value Date   TSH 1.20 06/21/2020   No results found for: ESRSEDRATE, POCTSEDRATE  Past Medical History:  Diagnosis Date  . Ankle pain, right   . Atypical chest pain    a. 10/2018 MV: EF 57%. No ischemia/infarct.  . Coronary artery disease   . Diastolic dysfunction    a. 04/2018 Echo: EF 55-60%, no rwma, Gr1 DD. Triv AI, mild MR. Mod dil LA. Nl RV fxn. PASP nl.  . GERD (gastroesophageal reflux disease)   . Headache    "dull one sometimes daily, at least weekly in last couple months" (07/22/2018)  . Hyperlipidemia   . Hypertension   . 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 Joint pain in fingers of right hand   . Lichen sclerosus   . Osteopenia   . Pneumonia 2012? X 1  . 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 Psoriasis   . Psoriatic arthritis ([place])    " dx'd the 1st of this year, 2019" (07/22/2018)  . Psoriatic arthritis ([place])   . Rosacea   . Seborrheic keratosis   . Second degree heart block    a. 07/2018 s/p SJM Assurity MRI model PM2271 (Ser# [id number]).  . Sleep apnea    "using nasal pilllows since ~ 12/2017" (07/22/2018)  . TMJ (dislocation of temporomandibular joint)     Past Surgical History:  Procedure Laterality Date  . APPENDECTOMY    . BREAST CYST ASPIRATION Bilateral   . BREAST CYST EXCISION Right 1978   benign  . COLONOSCOPY    . COLONOSCOPY WITH PROPOFOL N/A 07/10/2019  Procedure: COLONOSCOPY WITH PROPOFOL;  Surgeon: Lollie Sails, MD;  Location[facility] ENDOSCOPY;  Service: Endoscopy;  Laterality: N/A;  . ESOPHAGOGASTRODUODENOSCOPY (EGD) WITH PROPOFOL N/A 07/10/2019   Procedure: ESOPHAGOGASTRODUODENOSCOPY (EGD) WITH PROPOFOL;  Surgeon: Lollie Sails, MD;  Location: [facility] ENDOSCOPY;  Service: Endoscopy;  Laterality: N/A;  . HYSTEROSCOPY WITH D & C N/A 11/05/2018   Procedure: DILATATION AND CURETTAGE /HYSTEROSCOPY;  Surgeon:  Homero Fellers, MD;  Location: [facility] ORS;  Service: Gynecology;  Laterality: N/A;  . INSERT / REPLACE / REMOVE PACEMAKER    . 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 UTERINE POLYPS REMOVAL       Medications:  Outpatient Encounter Medications as of 08/02/2020  Medication Sig  . acetaminophen (TYLENOL) 325 MG tablet Take 325-650 mg by mouth every 6 (six) hours as needed for mild pain or headache.   . bisacodyl (DULCOLAX) 10 MG suppository Place 1 suppository (10 mg total) rectally daily as needed for moderate constipation.  . chlorhexidine (PERIDEX) 0.12 % solution 15 mLs by Mouth Rinse route 2 (two) times daily.  [name] desonide (DESOWEN) 0.05 % ointment Apply 1 application topically 2 (two) times daily as needed (for psoriasis).   . feeding supplement, ENSURE ENLIVE, (ENSURE ENLIVE) LIQD Take 237 mLs by mouth daily.  . hydrocerin (EUCERIN) CREA Apply 1 application topically 2 (two) times daily. To bilateral feet.  . polyethylene glycol (MIRALAX / GLYCOLAX) 17 g packet Take 17 g by mouth daily as needed for severe constipation.  . senna-docusate (SENOKOT-S) 8.6-50 MG tablet Take 1 tablet by mouth at bedtime as needed for mild constipation.  . tacrolimus (PROTOPIC) 0.1 % ointment Apply topically 2 (two) times daily as needed (for psoriasis).   . Vitamin D, Ergocalciferol, 50 MCG (2000 UT) CAPS Take by mouth.   No facility-administered encounter medications on file as of 08/02/2020.    Allergies:  Allergies  Allergen Reactions  . Etanercept     Guillain Baree  . Nickel     Family History: Family History  Problem Relation Age of Onset  . Stroke Mother   . Atrial fibrillation Mother   . Breast cancer Mother   . Stroke Father   . Breast cancer Maternal Aunt   . Breast cancer Paternal Aunt     Social History: Social History   Tobacco Use  . Smoking status: Never Smoker  . Smokeless tobacco: Never Used   . Tobacco comment: 07/22/2018 "smoked some in college; 1960s;  nothing since"  Vaping Use  . Vaping Use: Never used  Substance Use Topics  . Alcohol use: Yes    Comment: rarely  . Drug use: Never   Social History   Social History Narrative   Right Handed   Lives in an apartment. 5th floor but has elevator     Vital Signs:  BP (!) 170/83   Pulse 74   Ht 5\' 6"  (1.676 m)   Wt 170 lb (77.1 kg)   SpO2 97%   BMI 27.44 kg/m   Neurological Exam: MENTAL STATUS including orientation to time, place, person, recent and remote memory, attention span and concentration, language, and fund of knowledge is normal.  Speech is not dysarthric.  CRANIAL NERVES: II:  No visual field defects.  III-IV-VI: Pupils equal round and reactive to light.  Normal conjugate, extra-ocular eye movements in all directions of gaze.  No nystagmus.  No ptosis.   V:  Normal facial sensation.    VII:  Normal facial symmetry and movements.   VIII:  Normal hearing and vestibular function.   IX-X:  Normal palatal movement.   XI:  Normal shoulder shrug and head rotation.   XII:  Normal tongue strength and range of motion, no deviation or fasciculation.  MOTOR:  No atrophy, fasciculations or abnormal movements.  No pronator drift.   Upper Extremity:  Right  Left  Deltoid  5/5   5/5   Biceps  5/5   5/5   Triceps  5/5   5/5   Infraspinatus 5/5  5/5  Medial pectoralis 5/5  5/5  Wrist extensors  5/5   5/5   Wrist flexors  5/5   5/5   Finger extensors  5/5   5/5   Finger flexors  5/5   5/5   Dorsal interossei  5/5   5/5   Abductor pollicis  5/5   5/5   Tone (Ashworth scale)  0  0   Lower Extremity:  Right  Left  Hip flexors  5/5   5/5   Hip extensors  5/5   5/5   Adductor 5/5  5/5  Abductor 5/5  5/5  Knee flexors  5/5   5/5   Knee extensors  5/5   5/5   Dorsiflexors  5/5   5/5   Plantarflexors  5/5   5/5   Toe extensors  5/5   5/5   Toe flexors  5/5   5/5   Tone (Ashworth scale)  0  0   MSRs:  Right         Left                  brachioradialis 1+  1+  biceps 1+  1+  triceps 1+  1+  patellar 1+  1+  ankle jerk 0  0  Hoffman no  no  plantar response down  down   SENSORY:  Vibration reduced distal to ankles bilaterally, pin prick and temperature also reduced over the feet.    COORDINATION/GAIT: Normal finger-to- nose-finger.  Intact rapid alternating movements bilaterally.  Gait is wide-based, slightly unsteady, unassisted.  Appears much more stable assisted with rollator.  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 Managed with IVIG (no response), then plasmapheresis after which she has had slow and gradual improvement.  Strength is 5/5 throughout, she has mild ataxia and distal paresthesias which should continue to improve with time.  Enbrel, as well as other TNF blockers should be avoided.  NCS/EMG of the left arm and leg will be performed to guide prognosis Continue physical therapy Fall precautions discussed Data on risk of GBS with vaccines, specifically influenza and COVID19, was reviewed.  Patient may receive both vaccines as there is minimal risk of recurrence.  Return to clinic in 4 months  Total time spent reviewing records, interview, history/exam, documentation, and coordination of care on day of encounter:  60 min    Thank you for allowing me to participate in patient's care.  If I can answer any additional questions, I would be pleased to do so.    Sincerely,    Florinda Taflinger K. Posey Pronto, DO

## 2020-08-02 NOTE — Patient Instructions (Addendum)
You look great, keep up with your exercises and therapy  We will check nerve testing of the left arm and leg.  Do not apply lotion on your hands or feet on the day of testing  You may receive COVID19 booster and annual influenza vaccine  For more information, visit [email or web address]  Return to clinic in 4 months  [place] (EMG/NCS) INSTRUCTIONS  How to Prepare The neurologist conducting the EMG will need to know if you have certain medical conditions. Tell the neurologist and other EMG lab personnel if you: . Have a pacemaker or any other electrical medical device . Take blood-thinning medications . Have hemophilia, a blood-clotting disorder that causes prolonged bleeding Bathing Take a shower or bath shortly before your exam in order to remove oils from your skin. Don't apply lotions or creams before the exam.  What to Expect You'll likely be asked to change into a hospital gown for the procedure and lie down on an examination table. The following explanations can help you understand what will happen during the exam.  . Electrodes. The neurologist or a technician places surface electrodes at various locations on your skin depending on where you're experiencing symptoms. Or the neurologist may insert needle electrodes at different sites depending on your symptoms.  . Sensations. The electrodes will at times transmit a tiny electrical current that you may feel as a twinge or spasm. The needle electrode may cause discomfort or pain that usually ends shortly after the needle is removed. If you are concerned about discomfort or pain, you may want to talk to the neurologist about taking a short break during the exam.  . Instructions. During the needle EMG, the neurologist will assess whether there is any spontaneous electrical activity when the muscle is at rest - activity that isn't present in healthy muscle tissue - and the degree of activity when you  slightly contract the muscle.  He or she will give you instructions on resting and contracting a muscle at appropriate times. Depending on what muscles and nerves the neurologist is examining, he or she may ask you to change positions during the exam.  After your EMG You may experience some temporary, minor bruising where the needle electrode was inserted into your muscle. This bruising should fade within several days. If it persists, contact your primary care doctor.

## 2020-08-03 DIAGNOSIS — G610 Guillain-Barre syndrome: Secondary | ICD-10-CM | POA: Diagnosis not present

## 2020-08-03 DIAGNOSIS — M6281 Muscle weakness (generalized): Secondary | ICD-10-CM | POA: Diagnosis not present

## 2020-08-03 DIAGNOSIS — R2689 Other abnormalities of gait and mobility: Secondary | ICD-10-CM | POA: Diagnosis not present

## 2020-08-03 DIAGNOSIS — R279 Unspecified lack of coordination: Secondary | ICD-10-CM | POA: Diagnosis not present

## 2020-08-03 DIAGNOSIS — R490 Dysphonia: Secondary | ICD-10-CM | POA: Diagnosis not present

## 2020-08-04 DIAGNOSIS — R2689 Other abnormalities of gait and mobility: Secondary | ICD-10-CM | POA: Diagnosis not present

## 2020-08-04 DIAGNOSIS — G610 Guillain-Barre syndrome: Secondary | ICD-10-CM | POA: Diagnosis not present

## 2020-08-04 DIAGNOSIS — R279 Unspecified lack of coordination: Secondary | ICD-10-CM | POA: Diagnosis not present

## 2020-08-04 DIAGNOSIS — M6281 Muscle weakness (generalized): Secondary | ICD-10-CM | POA: Diagnosis not present

## 2020-08-04 DIAGNOSIS — R490 Dysphonia: Secondary | ICD-10-CM | POA: Diagnosis not present

## 2020-08-05 DIAGNOSIS — R279 Unspecified lack of coordination: Secondary | ICD-10-CM | POA: Diagnosis not present

## 2020-08-05 DIAGNOSIS — R490 Dysphonia: Secondary | ICD-10-CM | POA: Diagnosis not present

## 2020-08-05 DIAGNOSIS — R2689 Other abnormalities of gait and mobility: Secondary | ICD-10-CM | POA: Diagnosis not present

## 2020-08-05 DIAGNOSIS — G610 Guillain-Barre syndrome: Secondary | ICD-10-CM | POA: Diagnosis not present

## 2020-08-05 DIAGNOSIS — M6281 Muscle weakness (generalized): Secondary | ICD-10-CM | POA: Diagnosis not present

## 2020-08-06 ENCOUNTER — Ambulatory Visit (INDEPENDENT_AMBULATORY_CARE_PROVIDER_SITE_OTHER): Payer: Medicare Other | Admitting: Family Medicine

## 2020-08-06 ENCOUNTER — Encounter: Payer: Self-pay | Admitting: Family Medicine

## 2020-08-06 ENCOUNTER — Other Ambulatory Visit: Payer: Self-pay

## 2020-08-06 VITALS — BP 140/70 | HR 72 | Temp 98.2°F | Ht 66.0 in | Wt 171.4 lb

## 2020-08-06 DIAGNOSIS — M6281 Muscle weakness (generalized): Secondary | ICD-10-CM | POA: Diagnosis not present

## 2020-08-06 DIAGNOSIS — G610 Guillain-Barre syndrome: Secondary | ICD-10-CM | POA: Diagnosis not present

## 2020-08-06 DIAGNOSIS — R21 Rash and other nonspecific skin eruption: Secondary | ICD-10-CM

## 2020-08-06 DIAGNOSIS — I10 Essential (primary) hypertension: Secondary | ICD-10-CM | POA: Diagnosis not present

## 2020-08-06 DIAGNOSIS — B354 Tinea corporis: Secondary | ICD-10-CM | POA: Diagnosis not present

## 2020-08-06 DIAGNOSIS — R2689 Other abnormalities of gait and mobility: Secondary | ICD-10-CM | POA: Diagnosis not present

## 2020-08-06 DIAGNOSIS — L219 Seborrheic dermatitis, unspecified: Secondary | ICD-10-CM | POA: Insufficient documentation

## 2020-08-06 DIAGNOSIS — R279 Unspecified lack of coordination: Secondary | ICD-10-CM | POA: Diagnosis not present

## 2020-08-06 DIAGNOSIS — R490 Dysphonia: Secondary | ICD-10-CM | POA: Diagnosis not present

## 2020-08-06 MED ORDER — LOSARTAN POTASSIUM 25 MG PO TABS: 25.0000 mg | ORAL_TABLET | Freq: Every day | ORAL | 1 refills | Status: DC

## 2020-08-06 MED ORDER — KETOCONAZOLE 2 % EX CREA: 1.0000 "application " | TOPICAL_CREAM | Freq: Every day | CUTANEOUS | 0 refills | Status: DC

## 2020-08-06 NOTE — Assessment & Plan Note (Signed)
Seems to be progressing quite well.  She will continue physical therapy, Occupational Therapy, and speech therapy.  She will continue to see neurology and physical medicine and rehab.

## 2020-08-06 NOTE — Assessment & Plan Note (Signed)
Undetermined rash on her leg.  She can try hydrocortisone over-the-counter and if not beneficial she will see her dermatologist as planned.

## 2020-08-06 NOTE — Assessment & Plan Note (Signed)
Concern for tinea infection.  We will try ketoconazole on this to see if it is beneficial.  If not improving we can try topical steroid.  Otherwise she will see her dermatologist later this month.

## 2020-08-06 NOTE — Assessment & Plan Note (Signed)
Blood pressure has been elevated recently.  Will start on a low-dose of losartan.  She will return in 7 to 10 days for labs and BP check.

## 2020-08-06 NOTE — Assessment & Plan Note (Signed)
Rash on her forehead is likely seborrheic dermatitis versus psoriasis.  We will try ketoconazole cream on this and see if it improves.

## 2020-08-06 NOTE — Progress Notes (Signed)
Tommi Rumps, MD Phone: [phone]  Alison Huffman is a 79 y.o. female who presents today for follow-up.  Guillain-Barr syndrome: Patient notes she is progressing well in her recovery.  She continues physical therapy, occupational therapy, and speech therapy.  These have been beneficial.  She feels as though her speech is improving and her strength is improving.  She is using a cane some when she is inside though when she leaves the house she has to use the rolling walker as she feels somewhat unsteady.  She saw neurology recently.  They plan on doing nerve conduction studies.  They told her that it would be okay for her to take the COVID-19 vaccine and flu vaccines though noted it would be okay if she held off on them for some time to recover more.  Hypertension: Typically BPs have been running 130-170/60-83.  No chest pain.  She does have chronic dyspnea on exertion.  She has some mild chronic edema though it is improved.  Has a history of hypertension and was on Diovan previously.  Rash on face: Patient notes rash near her eyebrows and between her eyebrows.  There is flaking associated with this.  She tried her topical treatment for psoriasis and it helped some.  Rash on left chest: This has been present for some time now.  It started out small and has been getting bigger.  It does not itch.  The edges of it are raised.  Rash on right leg: This is on her right lateral leg.  Notes it is a rough spot.  She has not tried any treatment for it.  She does have an appointment with dermatology later this month.  Social History   Tobacco Use  Smoking Status Never Smoker  Smokeless Tobacco Never Used  Tobacco Comment   07/22/2018 "smoked some in college; 1960s;  nothing since"     ROS see history of present illness  Objective  Physical Exam Vitals:   08/06/20 1102  BP: 140/70  Pulse: 72  Temp: 98.2 F (36.8 C)  SpO2: 96%    BP Readings from Last 3 Encounters:  08/06/20  140/70  08/02/20 (!) 170/83  07/26/20 (!) 153/82   Wt Readings from Last 3 Encounters:  08/06/20 171 lb 6.4 oz (77.7 kg)  08/02/20 170 lb (77.1 kg)  07/26/20 172 lb 6.4 oz (78.2 kg)    Physical Exam Constitutional:      General: She is not in acute distress.    Appearance: She is not diaphoretic.  HENT:     Head:   Cardiovascular:     Rate and Rhythm: Normal rate and regular rhythm.     Heart sounds: Normal heart sounds.  Pulmonary:     Effort: Pulmonary effort is normal.     Breath sounds: Normal breath sounds.  Skin:    General: Skin is warm and dry.       Neurological:     Mental Status: She is alert.      Assessment/Plan: Please see individual problem list.  Problem List Items Addressed This Visit    Guillain Barr syndrome ([place])    Seems to be progressing quite well.  She will continue physical therapy, Occupational Therapy, and speech therapy.  She will continue to see neurology and physical medicine and rehab.      Hypertension - Primary    Blood pressure has been elevated recently.  Will start on a low-dose of losartan.  She will return in 7 to 10 days  for labs and BP check.      Relevant Medications   losartan (COZAAR) 25 MG tablet   Other Relevant Orders   Basic Metabolic Panel (BMET)   Rash    Undetermined rash on her leg.  She can try hydrocortisone over-the-counter and if not beneficial she will see her dermatologist as planned.      Relevant Medications   ketoconazole (NIZORAL) 2 % cream   Ringworm of body    Concern for tinea infection.  We will try ketoconazole on this to see if it is beneficial.  If not improving we can try topical steroid.  Otherwise she will see her dermatologist later this month.      Relevant Medications   ketoconazole (NIZORAL) 2 % cream   Seborrheic dermatitis    Rash on her forehead is likely seborrheic dermatitis versus psoriasis.  We will try ketoconazole cream on this and see if it improves.        This  visit occurred during the SARS-CoV-2 public health emergency.  Safety protocols were in place, including screening questions prior to the visit, additional usage of staff PPE, and extensive cleaning of exam room while observing appropriate contact time as indicated for disinfecting solutions.    Tommi Rumps, MD [place]

## 2020-08-06 NOTE — Patient Instructions (Signed)
Nice to see you.  I will start you on losartan 25 mg once daily.  We will have you return in 7-10 days for labs and a BP check.  Please continue with PT, OT, and ST. Please try the ketoconazole on your rash on your chest and face. If not improving please let me know.  Please try hydrocortisone on your leg rash.

## 2020-08-09 DIAGNOSIS — R2689 Other abnormalities of gait and mobility: Secondary | ICD-10-CM | POA: Diagnosis not present

## 2020-08-09 DIAGNOSIS — R279 Unspecified lack of coordination: Secondary | ICD-10-CM | POA: Diagnosis not present

## 2020-08-09 DIAGNOSIS — M6281 Muscle weakness (generalized): Secondary | ICD-10-CM | POA: Diagnosis not present

## 2020-08-09 DIAGNOSIS — R490 Dysphonia: Secondary | ICD-10-CM | POA: Diagnosis not present

## 2020-08-09 DIAGNOSIS — G610 Guillain-Barre syndrome: Secondary | ICD-10-CM | POA: Diagnosis not present

## 2020-08-10 DIAGNOSIS — G610 Guillain-Barre syndrome: Secondary | ICD-10-CM | POA: Diagnosis not present

## 2020-08-10 DIAGNOSIS — R279 Unspecified lack of coordination: Secondary | ICD-10-CM | POA: Diagnosis not present

## 2020-08-10 DIAGNOSIS — R490 Dysphonia: Secondary | ICD-10-CM | POA: Diagnosis not present

## 2020-08-10 DIAGNOSIS — M6281 Muscle weakness (generalized): Secondary | ICD-10-CM | POA: Diagnosis not present

## 2020-08-10 DIAGNOSIS — R2689 Other abnormalities of gait and mobility: Secondary | ICD-10-CM | POA: Diagnosis not present

## 2020-08-12 DIAGNOSIS — M6281 Muscle weakness (generalized): Secondary | ICD-10-CM | POA: Diagnosis not present

## 2020-08-12 DIAGNOSIS — G610 Guillain-Barre syndrome: Secondary | ICD-10-CM | POA: Diagnosis not present

## 2020-08-12 DIAGNOSIS — R490 Dysphonia: Secondary | ICD-10-CM | POA: Diagnosis not present

## 2020-08-12 DIAGNOSIS — R279 Unspecified lack of coordination: Secondary | ICD-10-CM | POA: Diagnosis not present

## 2020-08-12 DIAGNOSIS — R2689 Other abnormalities of gait and mobility: Secondary | ICD-10-CM | POA: Diagnosis not present

## 2020-08-13 DIAGNOSIS — R279 Unspecified lack of coordination: Secondary | ICD-10-CM | POA: Diagnosis not present

## 2020-08-13 DIAGNOSIS — R2689 Other abnormalities of gait and mobility: Secondary | ICD-10-CM | POA: Diagnosis not present

## 2020-08-13 DIAGNOSIS — G610 Guillain-Barre syndrome: Secondary | ICD-10-CM | POA: Diagnosis not present

## 2020-08-13 DIAGNOSIS — M6281 Muscle weakness (generalized): Secondary | ICD-10-CM | POA: Diagnosis not present

## 2020-08-13 DIAGNOSIS — R490 Dysphonia: Secondary | ICD-10-CM | POA: Diagnosis not present

## 2020-08-16 DIAGNOSIS — R279 Unspecified lack of coordination: Secondary | ICD-10-CM | POA: Diagnosis not present

## 2020-08-16 DIAGNOSIS — R2689 Other abnormalities of gait and mobility: Secondary | ICD-10-CM | POA: Diagnosis not present

## 2020-08-16 DIAGNOSIS — R490 Dysphonia: Secondary | ICD-10-CM | POA: Diagnosis not present

## 2020-08-16 DIAGNOSIS — G610 Guillain-Barre syndrome: Secondary | ICD-10-CM | POA: Diagnosis not present

## 2020-08-16 DIAGNOSIS — M6281 Muscle weakness (generalized): Secondary | ICD-10-CM | POA: Diagnosis not present

## 2020-08-17 ENCOUNTER — Ambulatory Visit (INDEPENDENT_AMBULATORY_CARE_PROVIDER_SITE_OTHER): Payer: Medicare Other

## 2020-08-17 ENCOUNTER — Other Ambulatory Visit: Payer: Self-pay

## 2020-08-17 ENCOUNTER — Telehealth: Payer: Self-pay | Admitting: Family Medicine

## 2020-08-17 DIAGNOSIS — M6281 Muscle weakness (generalized): Secondary | ICD-10-CM | POA: Diagnosis not present

## 2020-08-17 DIAGNOSIS — I10 Essential (primary) hypertension: Secondary | ICD-10-CM | POA: Diagnosis not present

## 2020-08-17 DIAGNOSIS — R490 Dysphonia: Secondary | ICD-10-CM | POA: Diagnosis not present

## 2020-08-17 DIAGNOSIS — R2689 Other abnormalities of gait and mobility: Secondary | ICD-10-CM | POA: Diagnosis not present

## 2020-08-17 DIAGNOSIS — R279 Unspecified lack of coordination: Secondary | ICD-10-CM | POA: Diagnosis not present

## 2020-08-17 DIAGNOSIS — G610 Guillain-Barre syndrome: Secondary | ICD-10-CM | POA: Diagnosis not present

## 2020-08-17 LAB — BASIC METABOLIC PANEL
BUN: 14 mg/dL (ref 6–23)
CO2: 26 mEq/L (ref 19–32)
Calcium: 8.9 mg/dL (ref 8.4–10.5)
Chloride: 105 mEq/L (ref 96–112)
Creatinine, Ser: 0.5 mg/dL (ref 0.40–1.20)
GFR: 88.35 mL/min (ref >60.00)
Glucose, Bld: 82 mg/dL (ref 70–99)
Potassium: 4.2 mEq/L (ref 3.5–5.1)
Sodium: 137 mEq/L (ref 135–145)

## 2020-08-17 NOTE — Telephone Encounter (Signed)
Pt came in for a nurse visit and wanted to know if Dr. Caryl Bis would fill out a handicap parking placard for her since she uses a walker  Please mail to pt when completed

## 2020-08-17 NOTE — Telephone Encounter (Signed)
  Pt came in for a nurse visit and wanted to know if Dr. Caryl Bis would fill out a handicap parking placard for her since she uses a walker  Please mail to pt when completed.  Put in sign basket.  Sophee Mckimmy,cma

## 2020-08-17 NOTE — Telephone Encounter (Signed)
Signed.  Please make available to pick up.

## 2020-08-17 NOTE — Progress Notes (Signed)
Patient is here for a BP check due to bp being high at last visit, as per patient.  Currently patients BP is 136/69 and BPM is 67.  Patient has no complaints of headaches, blurry vision, chest pain, arm pain, light headedness, dizziness, and nor jaw pain. Please see previous note for order.

## 2020-08-19 DIAGNOSIS — G610 Guillain-Barre syndrome: Secondary | ICD-10-CM | POA: Diagnosis not present

## 2020-08-19 DIAGNOSIS — M6281 Muscle weakness (generalized): Secondary | ICD-10-CM | POA: Diagnosis not present

## 2020-08-19 DIAGNOSIS — R2689 Other abnormalities of gait and mobility: Secondary | ICD-10-CM | POA: Diagnosis not present

## 2020-08-19 DIAGNOSIS — R279 Unspecified lack of coordination: Secondary | ICD-10-CM | POA: Diagnosis not present

## 2020-08-19 DIAGNOSIS — R490 Dysphonia: Secondary | ICD-10-CM | POA: Diagnosis not present

## 2020-08-19 NOTE — Telephone Encounter (Signed)
I called the patient and LVM informing her that the handicap application was mailed to her home today.  Brannon Decaire,cma

## 2020-08-20 DIAGNOSIS — M6281 Muscle weakness (generalized): Secondary | ICD-10-CM | POA: Diagnosis not present

## 2020-08-20 DIAGNOSIS — R279 Unspecified lack of coordination: Secondary | ICD-10-CM | POA: Diagnosis not present

## 2020-08-20 DIAGNOSIS — G610 Guillain-Barre syndrome: Secondary | ICD-10-CM | POA: Diagnosis not present

## 2020-08-20 DIAGNOSIS — R2689 Other abnormalities of gait and mobility: Secondary | ICD-10-CM | POA: Diagnosis not present

## 2020-08-20 DIAGNOSIS — R490 Dysphonia: Secondary | ICD-10-CM | POA: Diagnosis not present

## 2020-08-23 DIAGNOSIS — M6281 Muscle weakness (generalized): Secondary | ICD-10-CM | POA: Diagnosis not present

## 2020-08-23 DIAGNOSIS — R490 Dysphonia: Secondary | ICD-10-CM | POA: Diagnosis not present

## 2020-08-23 DIAGNOSIS — G610 Guillain-Barre syndrome: Secondary | ICD-10-CM | POA: Diagnosis not present

## 2020-08-23 DIAGNOSIS — R2689 Other abnormalities of gait and mobility: Secondary | ICD-10-CM | POA: Diagnosis not present

## 2020-08-23 DIAGNOSIS — R279 Unspecified lack of coordination: Secondary | ICD-10-CM | POA: Diagnosis not present

## 2020-08-24 DIAGNOSIS — R490 Dysphonia: Secondary | ICD-10-CM | POA: Diagnosis not present

## 2020-08-24 DIAGNOSIS — J3801 Paralysis of vocal cords and larynx, unilateral: Secondary | ICD-10-CM | POA: Diagnosis not present

## 2020-08-24 DIAGNOSIS — H6123 Impacted cerumen, bilateral: Secondary | ICD-10-CM | POA: Diagnosis not present

## 2020-08-24 DIAGNOSIS — H903 Sensorineural hearing loss, bilateral: Secondary | ICD-10-CM | POA: Diagnosis not present

## 2020-08-25 DIAGNOSIS — L900 Lichen sclerosus et atrophicus: Secondary | ICD-10-CM | POA: Diagnosis not present

## 2020-08-25 DIAGNOSIS — M6281 Muscle weakness (generalized): Secondary | ICD-10-CM | POA: Diagnosis not present

## 2020-08-25 DIAGNOSIS — L218 Other seborrheic dermatitis: Secondary | ICD-10-CM | POA: Diagnosis not present

## 2020-08-25 DIAGNOSIS — L821 Other seborrheic keratosis: Secondary | ICD-10-CM | POA: Diagnosis not present

## 2020-08-25 DIAGNOSIS — D225 Melanocytic nevi of trunk: Secondary | ICD-10-CM | POA: Diagnosis not present

## 2020-08-25 DIAGNOSIS — L400 Psoriasis vulgaris: Secondary | ICD-10-CM | POA: Diagnosis not present

## 2020-08-25 DIAGNOSIS — D2271 Melanocytic nevi of right lower limb, including hip: Secondary | ICD-10-CM | POA: Diagnosis not present

## 2020-08-25 DIAGNOSIS — R2689 Other abnormalities of gait and mobility: Secondary | ICD-10-CM | POA: Diagnosis not present

## 2020-08-25 DIAGNOSIS — R279 Unspecified lack of coordination: Secondary | ICD-10-CM | POA: Diagnosis not present

## 2020-08-25 DIAGNOSIS — D2262 Melanocytic nevi of left upper limb, including shoulder: Secondary | ICD-10-CM | POA: Diagnosis not present

## 2020-08-25 DIAGNOSIS — R490 Dysphonia: Secondary | ICD-10-CM | POA: Diagnosis not present

## 2020-08-25 DIAGNOSIS — G610 Guillain-Barre syndrome: Secondary | ICD-10-CM | POA: Diagnosis not present

## 2020-08-27 DIAGNOSIS — M6281 Muscle weakness (generalized): Secondary | ICD-10-CM | POA: Diagnosis not present

## 2020-08-27 DIAGNOSIS — G610 Guillain-Barre syndrome: Secondary | ICD-10-CM | POA: Diagnosis not present

## 2020-08-27 DIAGNOSIS — R2689 Other abnormalities of gait and mobility: Secondary | ICD-10-CM | POA: Diagnosis not present

## 2020-08-27 DIAGNOSIS — R279 Unspecified lack of coordination: Secondary | ICD-10-CM | POA: Diagnosis not present

## 2020-08-27 DIAGNOSIS — R490 Dysphonia: Secondary | ICD-10-CM | POA: Diagnosis not present

## 2020-08-30 DIAGNOSIS — R279 Unspecified lack of coordination: Secondary | ICD-10-CM | POA: Diagnosis not present

## 2020-08-30 DIAGNOSIS — M6281 Muscle weakness (generalized): Secondary | ICD-10-CM | POA: Diagnosis not present

## 2020-08-30 DIAGNOSIS — G610 Guillain-Barre syndrome: Secondary | ICD-10-CM | POA: Diagnosis not present

## 2020-08-30 DIAGNOSIS — R490 Dysphonia: Secondary | ICD-10-CM | POA: Diagnosis not present

## 2020-08-30 DIAGNOSIS — R2689 Other abnormalities of gait and mobility: Secondary | ICD-10-CM | POA: Diagnosis not present

## 2020-09-01 DIAGNOSIS — M6281 Muscle weakness (generalized): Secondary | ICD-10-CM | POA: Diagnosis not present

## 2020-09-01 DIAGNOSIS — R2689 Other abnormalities of gait and mobility: Secondary | ICD-10-CM | POA: Diagnosis not present

## 2020-09-01 DIAGNOSIS — R279 Unspecified lack of coordination: Secondary | ICD-10-CM | POA: Diagnosis not present

## 2020-09-01 DIAGNOSIS — G610 Guillain-Barre syndrome: Secondary | ICD-10-CM | POA: Diagnosis not present

## 2020-09-02 DIAGNOSIS — R279 Unspecified lack of coordination: Secondary | ICD-10-CM | POA: Diagnosis not present

## 2020-09-02 DIAGNOSIS — M6281 Muscle weakness (generalized): Secondary | ICD-10-CM | POA: Diagnosis not present

## 2020-09-02 DIAGNOSIS — R2689 Other abnormalities of gait and mobility: Secondary | ICD-10-CM | POA: Diagnosis not present

## 2020-09-02 DIAGNOSIS — G610 Guillain-Barre syndrome: Secondary | ICD-10-CM | POA: Diagnosis not present

## 2020-09-03 DIAGNOSIS — G610 Guillain-Barre syndrome: Secondary | ICD-10-CM | POA: Diagnosis not present

## 2020-09-03 DIAGNOSIS — M6281 Muscle weakness (generalized): Secondary | ICD-10-CM | POA: Diagnosis not present

## 2020-09-03 DIAGNOSIS — R2689 Other abnormalities of gait and mobility: Secondary | ICD-10-CM | POA: Diagnosis not present

## 2020-09-03 DIAGNOSIS — R279 Unspecified lack of coordination: Secondary | ICD-10-CM | POA: Diagnosis not present

## 2020-09-06 DIAGNOSIS — R279 Unspecified lack of coordination: Secondary | ICD-10-CM | POA: Diagnosis not present

## 2020-09-06 DIAGNOSIS — M6281 Muscle weakness (generalized): Secondary | ICD-10-CM | POA: Diagnosis not present

## 2020-09-06 DIAGNOSIS — R2689 Other abnormalities of gait and mobility: Secondary | ICD-10-CM | POA: Diagnosis not present

## 2020-09-06 DIAGNOSIS — G610 Guillain-Barre syndrome: Secondary | ICD-10-CM | POA: Diagnosis not present

## 2020-09-07 DIAGNOSIS — G610 Guillain-Barre syndrome: Secondary | ICD-10-CM | POA: Diagnosis not present

## 2020-09-07 DIAGNOSIS — R2689 Other abnormalities of gait and mobility: Secondary | ICD-10-CM | POA: Diagnosis not present

## 2020-09-07 DIAGNOSIS — M6281 Muscle weakness (generalized): Secondary | ICD-10-CM | POA: Diagnosis not present

## 2020-09-07 DIAGNOSIS — R279 Unspecified lack of coordination: Secondary | ICD-10-CM | POA: Diagnosis not present

## 2020-09-08 ENCOUNTER — Other Ambulatory Visit: Payer: Self-pay

## 2020-09-08 ENCOUNTER — Encounter: Payer: Self-pay | Admitting: Pulmonary Disease

## 2020-09-08 ENCOUNTER — Ambulatory Visit (INDEPENDENT_AMBULATORY_CARE_PROVIDER_SITE_OTHER): Payer: Medicare Other | Admitting: Pulmonary Disease

## 2020-09-08 VITALS — BP 132/80 | HR 79 | Temp 97.1°F | Ht 66.0 in | Wt 170.0 lb

## 2020-09-08 DIAGNOSIS — J479 Bronchiectasis, uncomplicated: Secondary | ICD-10-CM

## 2020-09-08 DIAGNOSIS — R918 Other nonspecific abnormal finding of lung field: Secondary | ICD-10-CM

## 2020-09-08 NOTE — Patient Instructions (Signed)
Repeat CT in 3 months from now-about March -To follow-up on the area of bronchiectasis and small spot at the base of the lung -Likely related to recent illness, should be clearing by then  Tentative follow-up in April  Continue using CPAP on a regular basis  Call with significant concerns

## 2020-09-08 NOTE — Progress Notes (Signed)
Alison Huffman    [id number]    1941-09-28  Primary Care Physician:Sonnenberg, Angela Adam, MD  Referring Physician: Leone Haven, MD [address] [place],  [place] [address]  Chief complaint:   Recent CT showing bronchiectasis Obstructive sleep apnea  HPI:  Was recently hospitalized for Guillain-Barr Was on the ventilator for a few days Had to go to inpatient rehab  She is feeling better generally She had a collapsed lung while hospitalized A CT scan was performed in follow-up of that  CT scan shows areas of bronchiectasis and a small nodule at the right base No previous CT to compare current one with  No history of underlying lung disease  Does have a history of obstructive sleep apnea Compliant with CPAP use Continues to benefit from CPAP use  Generally feeling better since recent hospitalization  Outpatient Encounter Medications as of 09/08/2020  Medication Sig  . acetaminophen (TYLENOL) 325 MG tablet Take 325-650 mg by mouth every 6 (six) hours as needed for mild pain or headache.   . bisacodyl (DULCOLAX) 10 MG suppository Place 1 suppository (10 mg total) rectally daily as needed for moderate constipation.  [name] desonide (DESOWEN) 0.05 % ointment Apply 1 application topically 2 (two) times daily as needed (for psoriasis).   . feeding supplement, ENSURE ENLIVE, (ENSURE ENLIVE) LIQD Take 237 mLs by mouth daily.  . hydrocerin (EUCERIN) CREA Apply 1 application topically 2 (two) times daily. To bilateral feet.  [name] ketoconazole (NIZORAL) 2 % cream Apply 1 application topically daily.  [name] losartan (COZAAR) 25 MG tablet Take 1 tablet (25 mg total) by mouth daily.  . polyethylene glycol (MIRALAX / GLYCOLAX) 17 g packet Take 17 g by mouth daily as needed for severe constipation.  . senna-docusate (SENOKOT-S) 8.6-50 MG tablet Take 1 tablet by mouth at bedtime as needed for mild constipation.  . tacrolimus (PROTOPIC) 0.1 % ointment Apply topically 2 (two)  times daily as needed (for psoriasis).   . Vitamin D, Ergocalciferol, 50 MCG (2000 UT) CAPS Take by mouth.  . Sodium Citrate Dihydrate (CITRA PH PO) Take by mouth.   No facility-administered encounter medications on file as of 09/08/2020.    Allergies as of 09/08/2020 - Review Complete 09/08/2020  Allergen Reaction Noted  . Etanercept  06/14/2020  . Nickel  03/25/2020    Past Medical History:  Diagnosis Date  . Ankle pain, right   . Atypical chest pain    a. 10/2018 MV: EF 57%. No ischemia/infarct.  . Coronary artery disease   . Diastolic dysfunction    a. 04/2018 Echo: EF 55-60%, no rwma, Gr1 DD. Triv AI, mild MR. Mod dil LA. Nl RV fxn. PASP nl.  . GERD (gastroesophageal reflux disease)   . Headache    "dull one sometimes daily, at least weekly in last couple months" (07/22/2018)  . Hyperlipidemia   . Hypertension   . 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 Joint pain in fingers of right hand   . Lichen sclerosus   . Osteopenia   . Pneumonia 2012? X 1  . 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 Psoriasis   . Psoriatic arthritis ([place])    " dx'd the 1st of this year, 2019" (07/22/2018)  . Psoriatic arthritis ([place])   . Rosacea   . Seborrheic keratosis   . Second degree heart block    a. 07/2018 s/p SJM Assurity MRI model PM2271 (Ser# [id number]).  . Sleep apnea    "  using nasal pilllows since ~ 12/2017" (07/22/2018)  . TMJ (dislocation of temporomandibular joint)     Past Surgical History:  Procedure Laterality Date  . APPENDECTOMY    . BREAST CYST ASPIRATION Bilateral   . BREAST CYST EXCISION Right 1978   benign  . COLONOSCOPY    . COLONOSCOPY WITH PROPOFOL N/A 07/10/2019   Procedure: COLONOSCOPY WITH PROPOFOL;  Surgeon: Lollie Sails, MD;  Location: [facility] ENDOSCOPY;  Service: Endoscopy;  Laterality: N/A;  . ESOPHAGOGASTRODUODENOSCOPY (EGD) WITH PROPOFOL N/A 07/10/2019   Procedure: ESOPHAGOGASTRODUODENOSCOPY (EGD) WITH PROPOFOL;  Surgeon: Lollie Sails, MD;   Location[facility] ENDOSCOPY;  Service: Endoscopy;  Laterality: N/A;  . HYSTEROSCOPY WITH D & C N/A 11/05/2018   Procedure: DILATATION AND CURETTAGE /HYSTEROSCOPY;  Surgeon: Homero Fellers, MD;  Location: [facility] ORS;  Service: Gynecology;  Laterality: N/A;  . INSERT / REPLACE / REMOVE PACEMAKER    . 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 UTERINE POLYPS REMOVAL      Family History  Problem Relation Age of Onset  . Stroke Mother   . Atrial fibrillation Mother   . Breast cancer Mother   . Stroke Father   . Breast cancer Maternal Aunt   . Breast cancer Paternal Aunt     Social History   Socioeconomic History  . Marital status: Single    Spouse name: Not on file  . Number of children: Not on file  . Years of education: Not on file  . Highest education level: Not on file  Occupational History  . Not on file  Tobacco Use  . Smoking status: Never Smoker  . Smokeless tobacco: Never Used  . Tobacco comment: 07/22/2018 "smoked some in college; 1960s;  nothing since"  Vaping Use  . Vaping Use: Never used  Substance and Sexual Activity  . Alcohol use: Yes    Comment: rarely  . Drug use: Never  . Sexual activity: Not Currently    Birth control/protection: Post-menopausal  Other Topics Concern  . Not on file  Social History Narrative   Right Handed   Lives in an apartment. 5th floor but has elevator    Social Determinants of Health   Financial Resource Strain:   . Difficulty of Paying Living Expenses: Not on file  Food Insecurity:   . Worried About Charity fundraiser in the Last Year: Not on file  . Ran Out of Food in the Last Year: Not on file  Transportation Needs:   . Lack of Transportation (Medical): Not on file  . Lack of Transportation (Non-Medical): Not on file  Physical Activity:   . Days of Exercise per Week: Not on file  . Minutes of Exercise per Session: Not on file  Stress:   .  Feeling of Stress : Not on file  Social Connections:   . Frequency of Communication with Friends and Family: Not on file  . Frequency of Social Gatherings with Friends and Family: Not on file  . Attends Religious Services: Not on file  . Active Member of Clubs or Organizations: Not on file  . Attends Archivist Meetings: Not on file  . Marital Status: Not on file  Intimate Partner Violence:   . Fear of Current or Ex-Partner: Not on file  . Emotionally Abused: Not on file  . Physically Abused: Not on file  . Sexually Abused: Not on file  Review of Systems  Constitutional: Negative for activity change and fatigue.  Respiratory: Positive for apnea. Negative for shortness of breath.   Psychiatric/Behavioral: Positive for sleep disturbance.    Vitals:   09/08/20 1112  BP: 132/80  Pulse: 79  Temp: (!) 97.1 F (36.2 C)  SpO2: 97%     Physical Exam Constitutional:      Appearance: Normal appearance.  HENT:     Nose: No congestion or rhinorrhea.     Mouth/Throat:     Mouth: Mucous membranes are moist.  Eyes:     General:        Right eye: No discharge.        Left eye: No discharge.  Cardiovascular:     Rate and Rhythm: Normal rate and regular rhythm.     Heart sounds: No murmur heard.  No friction rub.  Pulmonary:     Effort: No respiratory distress.     Breath sounds: No stridor. No wheezing or rhonchi.  Musculoskeletal:     Cervical back: No rigidity or tenderness.  Neurological:     Mental Status: She is alert.  Psychiatric:        Mood and Affect: Mood normal.    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

## 2020-09-09 DIAGNOSIS — R279 Unspecified lack of coordination: Secondary | ICD-10-CM | POA: Diagnosis not present

## 2020-09-09 DIAGNOSIS — M6281 Muscle weakness (generalized): Secondary | ICD-10-CM | POA: Diagnosis not present

## 2020-09-09 DIAGNOSIS — R2689 Other abnormalities of gait and mobility: Secondary | ICD-10-CM | POA: Diagnosis not present

## 2020-09-09 DIAGNOSIS — Z23 Encounter for immunization: Secondary | ICD-10-CM | POA: Diagnosis not present

## 2020-09-09 DIAGNOSIS — G610 Guillain-Barre syndrome: Secondary | ICD-10-CM | POA: Diagnosis not present

## 2020-09-13 DIAGNOSIS — R2689 Other abnormalities of gait and mobility: Secondary | ICD-10-CM | POA: Diagnosis not present

## 2020-09-13 DIAGNOSIS — M6281 Muscle weakness (generalized): Secondary | ICD-10-CM | POA: Diagnosis not present

## 2020-09-13 DIAGNOSIS — R279 Unspecified lack of coordination: Secondary | ICD-10-CM | POA: Diagnosis not present

## 2020-09-13 DIAGNOSIS — G610 Guillain-Barre syndrome: Secondary | ICD-10-CM | POA: Diagnosis not present

## 2020-09-14 DIAGNOSIS — G610 Guillain-Barre syndrome: Secondary | ICD-10-CM | POA: Diagnosis not present

## 2020-09-14 DIAGNOSIS — M6281 Muscle weakness (generalized): Secondary | ICD-10-CM | POA: Diagnosis not present

## 2020-09-14 DIAGNOSIS — R279 Unspecified lack of coordination: Secondary | ICD-10-CM | POA: Diagnosis not present

## 2020-09-14 DIAGNOSIS — R2689 Other abnormalities of gait and mobility: Secondary | ICD-10-CM | POA: Diagnosis not present

## 2020-09-15 ENCOUNTER — Ambulatory Visit (INDEPENDENT_AMBULATORY_CARE_PROVIDER_SITE_OTHER): Payer: Medicare Other | Admitting: Neurology

## 2020-09-15 ENCOUNTER — Other Ambulatory Visit: Payer: Self-pay

## 2020-09-15 ENCOUNTER — Encounter: Payer: Medicare Other | Admitting: Neurology

## 2020-09-15 DIAGNOSIS — G610 Guillain-Barre syndrome: Secondary | ICD-10-CM | POA: Diagnosis not present

## 2020-09-15 NOTE — Procedures (Signed)
[facility]  [address] [place]  [place], [place] [address] Tel: [phone] Fax:  [phone] Test Date:  09/15/2020  Patient: Alison Huffman DOB: May 30, 1941 Physician: Narda Amber, DO  Sex: Female Height: 5\' 6"  Ref Phys: Narda Amber, DO  ID#: [id number]   Technician:    Patient Complaints: This is an 79 year old female with Guillain-Barr syndrome referred for evaluation of paresthesias.  NCV & EMG Findings: Extensive electrodiagnostic testing of the left upper and lower extremity shows:  1. Left median, ulnar, and radial sensory responses show prolonged latency.  Sensory amplitude is reduced in the left ulnar and radial nerves.  Left sural and superficial peroneal sensory responses are absent.   2. Left median motor response shows prolonged latency.  Left ulnar motor response shows prolonged latency, reduced amplitude, and conduction velocity slowing across the elbow.  Motor responses in the leg are markedly reduced distally.  The left peroneal motor response to the tibialis anterior is within normal limits. 3. Left tibial H reflex study is absent.   4. Chronic motor axonal loss changes are seen affecting the muscles below the knee and ulnar innervated muscles.  There is no evidence of accompanied active denervation  Impression: 1. 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 2. 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   ___________________________ Narda Amber, DO    Nerve Conduction Studies Anti Sensory Summary Table   Stim Site NR Peak (ms) Norm Peak (ms) P-T Amp (V) Norm P-T Amp  Left Median Anti Sensory (2nd Digit)  34C  Wrist    4.5 <3.8 11.0 >10  Left Radial Anti Sensory (Base 1st Digit)  34C  Wrist    3.0 <2.8 6.7 >10  Left Sup Peroneal Anti Sensory (Ant Lat Mall)  34C  12  cm NR  <4.6  >3  Left Sural Anti Sensory (Lat Mall)  34C  Calf NR  <4.6  >3  Left Ulnar Anti Sensory (5th Digit)  34C  Wrist    3.9 <3.2 4.6 >5   Motor Summary Table   Stim Site NR Onset (ms) Norm Onset (ms) O-P Amp (mV) Norm O-P Amp Site1 Site2 Delta-0 (ms) Dist (cm) Vel (m/s) Norm Vel (m/s)  Left Median Motor (Abd Poll Brev)  34C  Wrist    4.4 <4.0 5.9 >5 Elbow Wrist 5.6 30.0 54 >50  Elbow    10.0  5.0         Left Peroneal Motor (Ext Dig Brev)  34C  Ankle NR  <6.0  >2.5 B Fib Ankle  0.0  >40  B Fib NR     Poplt B Fib  0.0  >40  Poplt NR            Left Peroneal TA Motor (Tib Ant)  34C  Fib Head    3.0 <4.5 3.3 >3 Poplit Fib Head 1.8 8.0 44 >40  Poplit    4.8  3.1         Left Tibial Motor (Abd Hall Brev)  34C  Ankle    5.4 <6.0 1.4 >4 Knee Ankle  0.0  >40  Knee NR            Left Ulnar Motor (Abd Dig Minimi)  34C  Wrist    3.2 <3.1 6.0 >7 B Elbow Wrist 4.6 23.0 50 >50  B Elbow  7.8  4.6  A Elbow B Elbow 2.7 10.0 37 >50  A Elbow    10.5  4.0          H Reflex Studies   NR H-Lat (ms) Lat Norm (ms) L-R H-Lat (ms)  Left Tibial (Gastroc)  34C  NR  <35    EMG   Side Muscle Ins Act Fibs Psw Fasc Number Recrt Dur Dur. Amp Amp. Poly Poly. Comment  Left AntTibialis Nml Nml Nml Nml 1- Rapid Some 1+ Some 1+ Some 1+ N/A  Left Gastroc Nml Nml Nml Nml 1- Rapid Some 1+ Some 1+ Some 1+ N/A  Left Flex Dig Long Nml Nml Nml Nml 1- Rapid Some 1+ Some 1+ Some 1+ N/A  Left RectFemoris Nml Nml Nml Nml Nml Nml Nml Nml Nml Nml Nml Nml N/A  Left GluteusMed Nml Nml Nml Nml Nml Nml Nml Nml Nml Nml Nml Nml N/A  Left Lumbo Parasp Low Nml Nml Nml Nml NE None - - - - - - N/A  Left BicepsFemS Nml Nml Nml Nml Nml Nml Nml Nml Nml Nml Nml Nml N/A  Left 1stDorInt Nml Nml Nml Nml 2- Rapid Some 1+ Some 1+ Some 1+ N/A  Left Abd Poll Brev Nml Nml Nml Nml Nml Nml Nml Nml Nml Nml Nml Nml N/A  Left Ext Indicis Nml Nml Nml Nml Nml Nml Nml Nml Nml Nml Nml Nml N/A  Left PronatorTeres Nml Nml Nml Nml Nml  Nml Nml Nml Nml Nml Nml Nml N/A  Left Biceps Nml Nml Nml Nml Nml Nml Nml Nml Nml Nml Nml Nml N/A  Left Triceps Nml Nml Nml Nml Nml Nml Nml Nml Nml Nml Nml Nml N/A  Left Deltoid Nml Nml Nml Nml Nml Nml Nml Nml Nml Nml Nml Nml N/A  Left ABD Dig Min Nml Nml Nml Nml 2- Rapid Some 1+ Some 1+ Some 1+ N/A  Left FlexCarpiUln Nml Nml Nml Nml 1- Rapid Some 1+ Some 1+ Some 1+ N/A      Waveforms:

## 2020-09-16 DIAGNOSIS — M6281 Muscle weakness (generalized): Secondary | ICD-10-CM | POA: Diagnosis not present

## 2020-09-16 DIAGNOSIS — G610 Guillain-Barre syndrome: Secondary | ICD-10-CM | POA: Diagnosis not present

## 2020-09-16 DIAGNOSIS — R2689 Other abnormalities of gait and mobility: Secondary | ICD-10-CM | POA: Diagnosis not present

## 2020-09-16 DIAGNOSIS — R279 Unspecified lack of coordination: Secondary | ICD-10-CM | POA: Diagnosis not present

## 2020-09-17 DIAGNOSIS — R279 Unspecified lack of coordination: Secondary | ICD-10-CM | POA: Diagnosis not present

## 2020-09-17 DIAGNOSIS — R2689 Other abnormalities of gait and mobility: Secondary | ICD-10-CM | POA: Diagnosis not present

## 2020-09-17 DIAGNOSIS — M6281 Muscle weakness (generalized): Secondary | ICD-10-CM | POA: Diagnosis not present

## 2020-09-17 DIAGNOSIS — G610 Guillain-Barre syndrome: Secondary | ICD-10-CM | POA: Diagnosis not present

## 2020-09-20 DIAGNOSIS — G610 Guillain-Barre syndrome: Secondary | ICD-10-CM | POA: Diagnosis not present

## 2020-09-20 DIAGNOSIS — M6281 Muscle weakness (generalized): Secondary | ICD-10-CM | POA: Diagnosis not present

## 2020-09-20 DIAGNOSIS — R279 Unspecified lack of coordination: Secondary | ICD-10-CM | POA: Diagnosis not present

## 2020-09-20 DIAGNOSIS — R2689 Other abnormalities of gait and mobility: Secondary | ICD-10-CM | POA: Diagnosis not present

## 2020-09-21 ENCOUNTER — Ambulatory Visit (INDEPENDENT_AMBULATORY_CARE_PROVIDER_SITE_OTHER): Payer: Medicare Other

## 2020-09-21 VITALS — BP 130/70 | HR 70 | Ht 66.0 in | Wt 170.0 lb

## 2020-09-21 DIAGNOSIS — R2689 Other abnormalities of gait and mobility: Secondary | ICD-10-CM | POA: Diagnosis not present

## 2020-09-21 DIAGNOSIS — R279 Unspecified lack of coordination: Secondary | ICD-10-CM | POA: Diagnosis not present

## 2020-09-21 DIAGNOSIS — M6281 Muscle weakness (generalized): Secondary | ICD-10-CM | POA: Diagnosis not present

## 2020-09-21 DIAGNOSIS — Z0000 Encounter for general adult medical examination without abnormal findings: Secondary | ICD-10-CM | POA: Diagnosis not present

## 2020-09-21 DIAGNOSIS — G610 Guillain-Barre syndrome: Secondary | ICD-10-CM | POA: Diagnosis not present

## 2020-09-21 NOTE — Progress Notes (Signed)
Subjective:   KAYLINA CAHUE is a 79 y.o. female who presents for Medicare Annual (Subsequent) preventive examination.  Review of Systems    No ROS.  Medicare Wellness Virtual Visit.    Cardiac Risk Factors include: advanced age (>72men, >67 women);hypertension     Objective:    Today's Vitals   09/21/20 0903  BP: 130/70  Pulse: 70  Weight: 170 lb (77.1 kg)  Height: 5\' 6"  (1.676 m)   Body mass index is 27.44 kg/m.  Advanced Directives 09/21/2020 08/02/2020 04/18/2020 04/18/2020 03/27/2020 03/25/2020 09/19/2019  Does Patient Have a Medical Advance Directive? Yes Yes No Yes No No Yes  Type of Paramedic of [place];Living will - Healthcare Power of [place]  Does patient want to make changes to medical advance directive? No - Patient declined - - - - - No - Patient declined  Copy of [place] in Chart? Yes - validated most recent copy scanned in chart (See row information) - - - - - No - copy requested  Would patient like information on creating a medical advance directive? - - - No - Patient declined No - Patient declined - -    Current Medications (verified) Outpatient Encounter Medications as of 09/21/2020  Medication Sig   acetaminophen (TYLENOL) 325 MG tablet Take 325-650 mg by mouth every 6 (six) hours as needed for mild pain or headache.    bisacodyl (DULCOLAX) 10 MG suppository Place 1 suppository (10 mg total) rectally daily as needed for moderate constipation.   desonide (DESOWEN) 0.05 % ointment Apply 1 application topically 2 (two) times daily as needed (for psoriasis).    feeding supplement, ENSURE ENLIVE, (ENSURE ENLIVE) LIQD Take 237 mLs by mouth daily.   hydrocerin (EUCERIN) CREA Apply 1 application topically 2 (two) times daily. To bilateral feet.   ketoconazole (NIZORAL) 2 % cream Apply 1 application topically daily.   losartan (COZAAR) 25  MG tablet Take 1 tablet (25 mg total) by mouth daily.   polyethylene glycol (MIRALAX / GLYCOLAX) 17 g packet Take 17 g by mouth daily as needed for severe constipation.   senna-docusate (SENOKOT-S) 8.6-50 MG tablet Take 1 tablet by mouth at bedtime as needed for mild constipation.   Sodium Citrate Dihydrate (CITRA PH PO) Take by mouth.   tacrolimus (PROTOPIC) 0.1 % ointment Apply topically 2 (two) times daily as needed (for psoriasis).    Vitamin D, Ergocalciferol, 50 MCG (2000 UT) CAPS Take by mouth.   No facility-administered encounter medications on file as of 09/21/2020.    Allergies (verified) Etanercept and Nickel   History: Past Medical History:  Diagnosis Date   Ankle pain, right    Atypical chest pain    a. 10/2018 MV: EF 57%. No ischemia/infarct.   Coronary artery disease    Diastolic dysfunction    a. 04/2018 Echo: EF 55-60%, no rwma, Gr1 DD. Triv AI, mild MR. Mod dil LA. Nl RV fxn. PASP nl.   GERD (gastroesophageal reflux disease)    Headache    "dull one sometimes daily, at least weekly in last couple months" (07/22/2018)   Hyperlipidemia    Hypertension    Inflammatory neuropathy (HCC)    Joint pain in fingers of right hand    Lichen sclerosus    Osteopenia    Pneumonia 2012? 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 07/2018 s/p SJM Assurity MRI model [id number] (Ser# [id number]).   Sleep apnea    "using nasal pilllows since ~ 12/2017" (07/22/2018)   TMJ (dislocation of temporomandibular joint)    Past Surgical History:  Procedure Laterality Date   APPENDECTOMY     BREAST CYST ASPIRATION Bilateral    BREAST CYST EXCISION Right 1978   benign   COLONOSCOPY     COLONOSCOPY WITH PROPOFOL N/A 07/10/2019   Procedure: COLONOSCOPY WITH  PROPOFOL;  Surgeon: Lollie Sails, MD;  Location: [facility] ENDOSCOPY;  Service: Endoscopy;  Laterality: N/A;   ESOPHAGOGASTRODUODENOSCOPY (EGD) WITH PROPOFOL N/A 07/10/2019   Procedure: ESOPHAGOGASTRODUODENOSCOPY (EGD) WITH PROPOFOL;  Surgeon: Lollie Sails, MD;  Location: [facility] ENDOSCOPY;  Service: Endoscopy;  Laterality: N/A;   HYSTEROSCOPY WITH D & C N/A 11/05/2018   Procedure: DILATATION AND CURETTAGE /HYSTEROSCOPY;  Surgeon: Homero Fellers, MD;  Location: [facility] ORS;  Service: Gynecology;  Laterality: N/A;   INSERT / REPLACE / REMOVE PACEMAKER     PACEMAKER IMPLANT N/A 07/23/2018   SJM Assurity [id number] implanted by Dr Rayann Heman for mobitz II second degree AV block   UTERINE POLYPS REMOVAL     Family History  Problem Relation Age of Onset   Stroke Mother    Atrial fibrillation Mother    Breast cancer Mother    Stroke Father    Breast cancer Maternal Aunt    Breast cancer Paternal Aunt    Social History   Socioeconomic History   Marital status: Single    Spouse name: Not on file   Number of children: Not on file   Years of education: Not on file   Highest education level: Not on file  Occupational History   Not on file  Tobacco Use   Smoking status: Never Smoker   Smokeless tobacco: Never Used   Tobacco comment: 07/22/2018 "smoked some in college; 1960s;  nothing since"  Vaping Use   Vaping Use: Never used  Substance and Sexual Activity   Alcohol use: Yes    Comment: rarely   Drug use: Never   Sexual activity: Not Currently    Birth control/protection: Post-menopausal  Other Topics Concern   Not on file  Social History Narrative   Right Handed   Lives in an apartment. 5th floor but has elevator    Social Determinants of Health   Financial Resource Strain: Low Risk    Difficulty of Paying Living Expenses: Not hard at all  Food Insecurity: No Food Insecurity   Worried About Charity fundraiser in the Last Year: Never true   Ran Out of  Food in the Last Year: Never true  Transportation Needs: No Transportation Needs   Lack of Transportation (Medical): No   Lack of Transportation (Non-Medical): No  Physical Activity: Sufficiently Active   Days of Exercise per Week: 4 days   Minutes of Exercise per Session: 60 min  Stress: No Stress Concern Present   Feeling of Stress : Not at all  Social Connections: Not on file    Tobacco Counseling Counseling given: Not Answered Comment: 07/22/2018 "smoked some in college; 1960s;  nothing since"   Clinical Intake:  Pre-visit preparation completed: Yes        Diabetes: No  How often do you need to have someone help you when  you read instructions, pamphlets, or other written materials from your doctor or pharmacy?: 1 - Never  Interpreter Needed?: No      Activities of Daily Living In your present state of health, do you have any difficulty performing the following activities: 09/21/2020 04/18/2020  Hearing? Y N  Comment Hearing aids -  Vision? N N  Difficulty concentrating or making decisions? N N  Walking or climbing stairs? Y Y  Dressing or bathing? N N  Doing errands, shopping? N N  Preparing Food and eating ? N -  Using the Toilet? N -  In the past six months, have you accidently leaked urine? N -  Do you have problems with loss of bowel control? N -  Managing your Medications? N -  Managing your Finances? N -  Comment Niece assist as needed -  Housekeeping or managing your Housekeeping? Y -  Comment Maid assist -  Some recent data might be hidden    Patient Care Team: Leone Haven, MD as PCP - General (Family Medicine) Rockey Situ Kathlene November, MD as PCP - Cardiology (Cardiology) Thompson Grayer, MD as PCP - Electrophysiology (Cardiology)  Indicate any recent Medical Services you may have received from other than [facility] providers in the past year (date may be approximate).     Assessment:   This is a routine wellness examination for Shanay.  I  connected with Maleeka today by telephone and verified that I am speaking with the correct person using two identifiers. Location patient: home Location provider: work Persons participating in the virtual visit: patient, Marine scientist.    I discussed the limitations, risks, security and privacy concerns of performing an evaluation and management service by telephone and the availability of in person appointments. I also discussed with the patient that there may be a patient responsible charge related to this service. The patient expressed understanding and verbally consented to this telephonic visit.    Interactive audio and video telecommunications were attempted between this provider and patient, however failed, due to patient having technical difficulties OR patient did not have access to video capability.  We continued and completed visit with audio only.  Some vital signs may be absent or patient reported.   Hearing/Vision screen  Hearing Screening   125Hz  250Hz  500Hz  1000Hz  2000Hz  3000Hz  4000Hz  6000Hz  8000Hz   Right ear:           Left ear:           Comments: Hearing aids  Vision Screening Comments: Followed by [facility] Ophthalmologist  Wears L eye contact lense  Cataract extraction, bilateral  Visual acuity not assessed per patient preference since they have regular follow up with the ophthalmologist  Dietary issues and exercise activities discussed: Current Exercise Habits: Home exercise routine;Structured exercise class, Frequency (Times/Week): 4, Intensity: Mild  Regular diet Good water intake  Goals      Patient Stated     I would like to do more brain challenging activities (pt-stated)      Maintain physical activity      Depression Screen PHQ 2/9 Scores 09/21/2020 07/26/2020 06/21/2020 05/28/2020 03/24/2020 09/22/2019 09/19/2019  PHQ - 2 Score 0 0 0 0 0 0 0  PHQ- 9 Score - - - 2 - - -    Fall Risk Fall Risk  09/21/2020 08/02/2020 07/26/2020 06/21/2020 05/28/2020   Falls in the past year? 0 0 0 1 0  Number falls in past yr: 0 0 0 1 -  Injury with Fall? 0  0 0 0 -  Risk for fall due to : Impaired balance/gait - - - -  Follow up Falls evaluation completed - - Falls evaluation completed -    FALL RISK PREVENTION PERTAINING TO THE HOME: Handrails in use when climbing stairs? Yes  Home free of loose throw rugs in walkways, pet beds, electrical cords, etc? Yes  Adequate lighting in your home to reduce risk of falls? Yes   ASSISTIVE DEVICES UTILIZED TO PREVENT FALLS: Life alert? Yes  Use of a cane, walker or w/c? Yes  Grab bars in the bathroom? Yes  Shower chair or bench in shower? Yes  Elevated toilet seat or a handicapped toilet? Yes   TIMED UP AND GO: Was the test performed? No . Virtual visit.   Cognitive Function: MMSE - Mini Mental State Exam 05/03/2018  Orientation to time 5  Orientation to Place 5  Registration 3  Attention/ Calculation 5  Recall 3  Language- name 2 objects 2  Language- repeat 1  Language- follow 3 step command 3  Language- read & follow direction 1  Write a sentence 1  Copy design 1  Total score 30     6CIT Screen 09/21/2020 09/19/2019  What Year? 0 points 0 points  What month? 0 points 0 points  What time? 0 points 0 points  Count back from 20 0 points 0 points  Months in reverse 0 points 0 points  Repeat phrase 0 points 0 points  Total Score 0 0    Immunizations Immunization History  Administered Date(s) Administered   Fluad Quad(high Dose 65+) 06/18/2019   Hepatitis B 01/01/1990, 01/31/1990, 07/03/1990   Influenza-Unspecified 06/18/2017, 07/10/2018   PFIZER SARS-COV-2 Vaccination 10/09/2019, 11/05/2019, 09/09/2020   Pneumococcal Conjugate-13 05/06/2014   Pneumococcal Polysaccharide-23 05/10/2005   Tdap 09/04/2017   Unspecified SARS-COV-2 Vaccination 10/09/2019, 11/05/2019   Zoster 08/11/2008   Zoster Recombinat (Shingrix) 08/26/2019   Health Maintenance Health Maintenance  Topic Date  Due   INFLUENZA VACCINE  12/30/2020 (Originally 05/02/2020)   TETANUS/TDAP  09/05/2027   DEXA SCAN  Completed   COVID-19 Vaccine  Completed   PNA vac Low Risk Adult  Completed   Colorectal cancer screening: No longer required.  Last noted 07/10/19.   Mammogram status: Completed 06/18/20. Repeat every year. Bilateral with TOMO and CAD.  Bone Density status: Completed 11/13/18. Results reflect: Bone density results: OSTEOPOROSIS. Repeat every 2 years.  Lung Cancer Screening: (Low Dose CT Chest recommended if Age 60-80 years, 30 pack-year currently smoking OR have quit w/in 15years.) does not qualify.    Hepatitis C Screening: does not qualify  Vision Screening: Recommended annual ophthalmology exams for early detection of glaucoma and other disorders of the eye. Is the patient up to date with their annual eye exam?  Yes  Who is the provider or what is the name of the office in which the patient attends annual eye exams? [facility]  Dental Screening: Recommended annual dental exams for proper oral hygiene.  Community Resource Referral / Chronic Care Management: CRR required this visit?  No   CCM required this visit?  No      Plan:   Keep all routine maintenance appointments.   Next scheduled nurse visit 09/29/20; flu vaccine  Follow up 11/08/20 @ 10:00  I have personally reviewed and noted the following in the patients chart:    Medical and social history  Use of alcohol, tobacco or illicit drugs   Current medications and supplements  Functional ability and  status  Nutritional status  Physical activity  Advanced directives  List of other physicians  Hospitalizations, surgeries, and ER visits in previous 12 months  Vitals  Screenings to include cognitive, depression, and falls  Referrals and appointments  In addition, I have reviewed and discussed with patient certain preventive protocols, quality metrics, and best practice recommendations. A  written personalized care plan for preventive services as well as general preventive health recommendations were provided to patient via mychart.     Varney Biles, LPN   [id number]

## 2020-09-21 NOTE — Patient Instructions (Addendum)
Alison Huffman , Thank you for taking time to come for your Medicare Wellness Visit. I appreciate your ongoing commitment to your health goals. Please review the following plan we discussed and let me know if I can assist you in the future.   These are the goals we discussed: Goals      Patient Stated   .  I would like to do more brain challenging activities (pt-stated)      Maintain physical activity       This is a list of the screening recommended for you and due dates:  Health Maintenance  Topic Date Due  . Flu Shot  12/30/2020*  . Tetanus Vaccine  09/05/2027  . DEXA scan (bone density measurement)  Completed  . COVID-19 Vaccine  Completed  . Pneumonia vaccines  Completed  *Topic was postponed. The date shown is not the original due date.    Immunizations Immunization History  Administered Date(s) Administered  . Fluad Quad(high Dose 65+) 06/18/2019  . Hepatitis B 01/01/1990, 01/31/1990, 07/03/1990  . Influenza-Unspecified 06/18/2017, 07/10/2018  . PFIZER SARS-COV-2 Vaccination 10/09/2019, 11/05/2019, 09/09/2020  . Pneumococcal Conjugate-13 05/06/2014  . Pneumococcal Polysaccharide-23 05/10/2005  . Tdap 09/04/2017  . Unspecified SARS-COV-2 Vaccination 10/09/2019, 11/05/2019  . Zoster 08/11/2008  . Zoster Recombinat (Shingrix) 08/26/2019   Keep all routine maintenance appointments.   Next scheduled nurse visit 09/29/20; flu vaccine  Follow up 11/08/20 @ 10:00  Advanced directives: on file  Conditions/risks identified: none new  Follow up in one year for your annual wellness visit.   Preventive Care 9 Years and Older, Female Preventive care refers to lifestyle choices and visits with your health care provider that can promote health and wellness. What does preventive care include?  A yearly physical exam. This is also called an annual well check.  Dental exams once or twice a year.  Routine eye exams. Ask your health care provider how often you should have your  eyes checked.  Personal lifestyle choices, including:  Daily care of your teeth and gums.  Regular physical activity.  Eating a healthy diet.  Avoiding tobacco and drug use.  Limiting alcohol use.  Practicing safe sex.  Taking low-dose aspirin every day.  Taking vitamin and mineral supplements as recommended by your health care provider. What happens during an annual well check? The services and screenings done by your health care provider during your annual well check will depend on your age, overall health, lifestyle risk factors, and family history of disease. Counseling  Your health care provider may ask you questions about your:  Alcohol use.  Tobacco use.  Drug use.  Emotional well-being.  Home and relationship well-being.  Sexual activity.  Eating habits.  History of falls.  Memory and ability to understand (cognition).  Work and work Statistician.  Reproductive health. Screening  You may have the following tests or measurements:  Height, weight, and BMI.  Blood pressure.  Lipid and cholesterol levels. These may be checked every 5 years, or more frequently if you are over 101 years old.  Skin check.  Lung cancer screening. You may have this screening every year starting at age 49 if you have a 30-pack-year history of smoking and currently smoke or have quit within the past 15 years.  Fecal occult blood test (FOBT) of the stool. You may have this test every year starting at age 57.  Flexible sigmoidoscopy or colonoscopy. You may have a sigmoidoscopy every 5 years or a colonoscopy every 10 years starting at  age 56.  Hepatitis C blood test.  Hepatitis B blood test.  Sexually transmitted disease (STD) testing.  Diabetes screening. This is done by checking your blood sugar (glucose) after you have not eaten for a while (fasting). You may have this done every 1-3 years.  Bone density scan. This is done to screen for osteoporosis. You may have this  done starting at age 77.  Mammogram. This may be done every 1-2 years. Talk to your health care provider about how often you should have regular mammograms. Talk with your health care provider about your test results, treatment options, and if necessary, the need for more tests. Vaccines  Your health care provider may recommend certain vaccines, such as:  Influenza vaccine. This is recommended every year.  Tetanus, diphtheria, and acellular pertussis (Tdap, Td) vaccine. You may need a Td booster every 10 years.  Zoster vaccine. You may need this after age 23.  Pneumococcal 13-valent conjugate (PCV13) vaccine. One dose is recommended after age 26.  Pneumococcal polysaccharide (PPSV23) vaccine. One dose is recommended after age 39. Talk to your health care provider about which screenings and vaccines you need and how often you need them. This information is not intended to replace advice given to you by your health care provider. Make sure you discuss any questions you have with your health care provider. Document Released: 10/15/2015 Document Revised: 06/07/2016 Document Reviewed: 07/20/2015 Elsevier Interactive Patient Education  2017 [place] Prevention in the Home Falls can cause injuries. They can happen to people of all ages. There are many things you can do to make your home safe and to help prevent falls. What can I do on the outside of my home?  Regularly fix the edges of walkways and driveways and fix any cracks.  Remove anything that might make you trip as you walk through a door, such as a raised step or threshold.  Trim any bushes or trees on the path to your home.  Use bright outdoor lighting.  Clear any walking paths of anything that might make someone trip, such as rocks or tools.  Regularly check to see if handrails are loose or broken. Make sure that both sides of any steps have handrails.  Any raised decks and porches should have guardrails on the  edges.  Have any leaves, snow, or ice cleared regularly.  Use sand or salt on walking paths during winter.  Clean up any spills in your garage right away. This includes oil or grease spills. What can I do in the bathroom?  Use night lights.  Install grab bars by the toilet and in the tub and shower. Do not use towel bars as grab bars.  Use non-skid mats or decals in the tub or shower.  If you need to sit down in the shower, use a plastic, non-slip stool.  Keep the floor dry. Clean up any water that spills on the floor as soon as it happens.  Remove soap buildup in the tub or shower regularly.  Attach bath mats securely with double-sided non-slip rug tape.  Do not have throw rugs and other things on the floor that can make you trip. What can I do in the bedroom?  Use night lights.  Make sure that you have a light by your bed that is easy to reach.  Do not use any sheets or blankets that are too big for your bed. They should not hang down onto the floor.  Have a firm chair  that has side arms. You can use this for support while you get dressed.  Do not have throw rugs and other things on the floor that can make you trip. What can I do in the kitchen?  Clean up any spills right away.  Avoid walking on wet floors.  Keep items that you use a lot in easy-to-reach places.  If you need to reach something above you, use a strong step stool that has a grab bar.  Keep electrical cords out of the way.  Do not use floor polish or wax that makes floors slippery. If you must use wax, use non-skid floor wax.  Do not have throw rugs and other things on the floor that can make you trip. What can I do with my stairs?  Do not leave any items on the stairs.  Make sure that there are handrails on both sides of the stairs and use them. Fix handrails that are broken or loose. Make sure that handrails are as long as the stairways.  Check any carpeting to make sure that it is firmly  attached to the stairs. Fix any carpet that is loose or worn.  Avoid having throw rugs at the top or bottom of the stairs. If you do have throw rugs, attach them to the floor with carpet tape.  Make sure that you have a light switch at the top of the stairs and the bottom of the stairs. If you do not have them, ask someone to add them for you. What else can I do to help prevent falls?  Wear shoes that:  Do not have high heels.  Have rubber bottoms.  Are comfortable and fit you well.  Are closed at the toe. Do not wear sandals.  If you use a stepladder:  Make sure that it is fully opened. Do not climb a closed stepladder.  Make sure that both sides of the stepladder are locked into place.  Ask someone to hold it for you, if possible.  Clearly mark and make sure that you can see:  Any grab bars or handrails.  First and last steps.  Where the edge of each step is.  Use tools that help you move around (mobility aids) if they are needed. These include:  Canes.  Walkers.  Scooters.  Crutches.  Turn on the lights when you go into a dark area. Replace any light bulbs as soon as they burn out.  Set up your furniture so you have a clear path. Avoid moving your furniture around.  If any of your floors are uneven, fix them.  If there are any pets around you, be aware of where they are.  Review your medicines with your doctor. Some medicines can make you feel dizzy. This can increase your chance of falling. Ask your doctor what other things that you can do to help prevent falls. This information is not intended to replace advice given to you by your health care provider. Make sure you discuss any questions you have with your health care provider. Document Released: 07/15/2009 Document Revised: 02/24/2016 Document Reviewed: 10/23/2014 Elsevier Interactive Patient Education  2017 [employer].

## 2020-09-22 ENCOUNTER — Ambulatory Visit: Payer: Medicare Other

## 2020-09-22 ENCOUNTER — Encounter
Payer: Medicare Other | Attending: Physical Medicine and Rehabilitation | Admitting: Physical Medicine and Rehabilitation

## 2020-09-22 ENCOUNTER — Encounter: Payer: Self-pay | Admitting: Physical Medicine and Rehabilitation

## 2020-09-22 ENCOUNTER — Other Ambulatory Visit: Payer: Self-pay

## 2020-09-22 VITALS — BP 154/80 | HR 68 | Temp 97.9°F | Ht 66.0 in | Wt 174.0 lb

## 2020-09-22 DIAGNOSIS — G610 Guillain-Barre syndrome: Secondary | ICD-10-CM | POA: Diagnosis not present

## 2020-09-22 DIAGNOSIS — M6281 Muscle weakness (generalized): Secondary | ICD-10-CM | POA: Insufficient documentation

## 2020-09-22 DIAGNOSIS — Z7409 Other reduced mobility: Secondary | ICD-10-CM | POA: Insufficient documentation

## 2020-09-22 DIAGNOSIS — R269 Unspecified abnormalities of gait and mobility: Secondary | ICD-10-CM | POA: Diagnosis not present

## 2020-09-22 NOTE — Progress Notes (Signed)
Subjective:    Patient ID: Alison Huffman, female    DOB: [birth year], 79 y.o.   MRN: [id number]  HPI Patient is an 79 yr old female with Guillain Barre syndrome- hx of OSA, dCHF, chest tubes due to B/L pneumothorax due to Code blue/being bagged, seizure, hyper and hyponatremia- acute; S/P IVIG and plasmapharesis.  As well as R vocal cord paralysis. Now eating regular diet! And dysphagia has resolved.    Still using 4 pronged cane.   Neurology said things progressing well- and looking better than record   Had EMG last week- told would likely get full recovery in hands (did on L side)- feet may get better, but not 100%.   Got jar opener- has helped.  Tried Lidocaine gel some- hasn't needed it much- a little swollen, but can move them- just tight now- no burning pain in hands now.   Feet and lower legs still swelling and toes feel a little puffy/swollen.  A little tingling, but not much-  Can walk and doesn't really impair function. But does notice if wiggles toes.    Able to walk a lot further- gets tired.  Can stop and sit /resting areas.  Use rollator when goes to dining room so has more stability. Has to manipulate around people.   Walking more than 200 ft without a break.  Walked ALMOST a mile by end of therapy with breaks. Was with cane.   Therapy always has her walk with cane.  Doing therapy in pool 1x/week. Good for endurance.   Started driving some- 4-0X in last month- got handicapped plaque.  Did grocery shopping x 2- was able to do walking AND shopping and driving.          Pain Inventory Average Pain 1 Pain Right Now 0 My pain is burning, stabbing and tingling  LOCATION OF PAIN  Hands, fingers, feet, knee, leg  BOWEL Number of stools per week: 3 Oral laxative use Yes  Type of laxative colace Enema or suppository use No  History of colostomy No  Incontinent No   BLADDER Normal In and out cath, frequency n/a Able to self cath n/a Bladder  incontinence No  Frequent urination No  Leakage with coughing No  Difficulty starting stream No  Incomplete bladder emptying No    Mobility walk with assistance use a cane use a walker how many minutes can you walk? 10 ability to climb steps?  yes do you drive?  yes Do you have any goals in this area?  yes  Function retired  Neuro/Psych weakness numbness tingling  Prior Studies Any changes since last visit?  no  Physicians involved in your care Any changes since last visit?  no   Family History  Problem Relation Age of Onset  . Stroke Mother   . Atrial fibrillation Mother   . Breast cancer Mother   . Stroke Father   . Breast cancer Maternal Aunt   . Breast cancer Paternal Aunt    Social History   Socioeconomic History  . Marital status: Single    Spouse name: Not on file  . Number of children: Not on file  . Years of education: Not on file  . Highest education level: Not on file  Occupational History  . Not on file  Tobacco Use  . Smoking status: Never Smoker  . Smokeless tobacco: Never Used  . Tobacco comment: 07/22/2018 "smoked some in college; 1960s;  nothing since"  Vaping Use  . Vaping Use:  Never used  Substance and Sexual Activity  . Alcohol use: Yes    Comment: rarely  . Drug use: Never  . Sexual activity: Not Currently    Birth control/protection: Post-menopausal  Other Topics Concern  . Not on file  Social History Narrative   Right Handed   Lives in an apartment. 5th floor but has elevator    Social Determinants of Health   Financial Resource Strain: Low Risk   . Difficulty of Paying Living Expenses: Not hard at all  Food Insecurity: No Food Insecurity  . Worried About Charity fundraiser in the Last Year: Never true  . Ran Out of Food in the Last Year: Never true  Transportation Needs: No Transportation Needs  . Lack of Transportation (Medical): No  . Lack of Transportation (Non-Medical): No  Physical Activity: Sufficiently  Active  . Days of Exercise per Week: 4 days  . Minutes of Exercise per Session: 60 min  Stress: No Stress Concern Present  . Feeling of Stress : Not at all  Social Connections: Not on file   Past Surgical History:  Procedure Laterality Date  . APPENDECTOMY    . BREAST CYST ASPIRATION Bilateral   . BREAST CYST EXCISION Right 1978   benign  . COLONOSCOPY    . COLONOSCOPY WITH PROPOFOL N/A 07/10/2019   Procedure: COLONOSCOPY WITH PROPOFOL;  Surgeon: Lollie Sails, MD;  Location: [facility] ENDOSCOPY;  Service: Endoscopy;  Laterality: N/A;  . ESOPHAGOGASTRODUODENOSCOPY (EGD) WITH PROPOFOL N/A 07/10/2019   Procedure: ESOPHAGOGASTRODUODENOSCOPY (EGD) WITH PROPOFOL;  Surgeon: Lollie Sails, MD;  Location: [facility] ENDOSCOPY;  Service: Endoscopy;  Laterality: N/A;  . HYSTEROSCOPY WITH D & C N/A 11/05/2018   Procedure: DILATATION AND CURETTAGE /HYSTEROSCOPY;  Surgeon: Homero Fellers, MD;  Location: [facility] ORS;  Service: Gynecology;  Laterality: N/A;  . INSERT / REPLACE / REMOVE PACEMAKER    . 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 UTERINE POLYPS REMOVAL     Past Medical History:  Diagnosis Date  . Ankle pain, right   . Atypical chest pain    a. 10/2018 MV: EF 57%. No ischemia/infarct.  . Coronary artery disease   . Diastolic dysfunction    a. 04/2018 Echo: EF 55-60%, no rwma, Gr1 DD. Triv AI, mild MR. Mod dil LA. Nl RV fxn. PASP nl.  . GERD (gastroesophageal reflux disease)   . Headache    "dull one sometimes daily, at least weekly in last couple months" (07/22/2018)  . Hyperlipidemia   . Hypertension   . 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 Joint pain in fingers of right hand   . Lichen sclerosus   . Osteopenia   . Pneumonia 2012? X 1  . 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 Psoriasis   . Psoriatic arthritis ([place])    " dx'd the 1st of this year, 2019" (07/22/2018)  . Psoriatic arthritis  ([place])   . Rosacea   . Seborrheic keratosis   . Second degree heart block    a. 07/2018 s/p SJM Assurity MRI model PM2271 (Ser# [id number]).  . Sleep apnea    "using nasal pilllows since ~ 12/2017" (07/22/2018)  . TMJ (dislocation of temporomandibular joint)    BP (!) 154/80   Pulse 68   Temp 97.9 F (36.6 C)   Ht 5\' 6"  (1.676 m)   Wt 174 lb (78.9 kg)   SpO2 93%  BMI 28.08 kg/m   Opioid Risk Score:   Fall Risk Score:  `1  Depression screen PHQ 2/9  Depression screen [facility] 2/9 09/21/2020 07/26/2020 06/21/2020 05/28/2020 03/24/2020 09/22/2019 09/19/2019  Decreased Interest 0 0 0 0 0 0 0  Down, Depressed, Hopeless 0 0 0 0 0 0 0  PHQ - 2 Score 0 0 0 0 0 0 0  Altered sleeping - - - 1 - - -  Tired, decreased energy - - - 1 - - -  Change in appetite - - - 0 - - -  Feeling bad or failure about yourself  - - - 0 - - -  Trouble concentrating - - - 0 - - -  Moving slowly or fidgety/restless - - - 0 - - -  Suicidal thoughts - - - 0 - - -  PHQ-9 Score - - - 2 - - -  Difficult doing work/chores - - - Not difficult at all - - -    Review of Systems  Constitutional: Negative.   HENT: Negative.   Eyes: Negative.   Respiratory: Negative.   Cardiovascular: Negative.   Gastrointestinal: Negative.   Endocrine: Negative.   Genitourinary: Negative.   Musculoskeletal: Negative.   Skin: Negative.   Allergic/Immunologic: Negative.   Neurological: Positive for weakness and numbness.       Tingling   Hematological: Negative.   Psychiatric/Behavioral: Negative.   All other systems reviewed and are negative.      Objective:   Physical Exam Awake, alert, appropriate, has small 4 pronged cane, NAD R>L hand- fingers swollen/tight- and swollen around MCPs R>L- arthritic- effusions of MCPs MS: UEs- deltoids, biceps, triceps all 5-/5- Grip and finger abd 4+/5 to 5-/5 B/L LEs- HF 4+/5 on R; 5-/5 on L, KE 5/5 B/L, DF and PF 5-/5 B/L   Neuro: Intact to light touch in UEs B/L Intact to light  touch in LEs except  L>R decreased sensation from ankle down B/L Good gait- appropriate posture- good stride length; using cane appropriately.         Assessment & Plan:    Patient is an 79 yr old female with Guillain Barre syndrome- hx of OSA, dCHF, chest tubes due to B/L pneumothorax due to Code blue/being bagged, seizure, hyper and hyponatremia- acute; S/P IVIG and plasmapharesis.  As well as R vocal cord paralysis. Now eating regular diet! And dysphagia has resolved.  Hand arthritis.   1. Voltaren gel over the counter can help hand swelling/arthritic pain- up to 4x/day.    2. Liver and gallbladder U/S 7-8 months from now- due to sludge and hepatic steatosis.   3. Suggest exercise ball- blow up til knees at 90 degrees-  Then just sit on it- for now- goal 30-60 minutes/day- then will work on extending reach, etc so can increase core strength.  Kinder, gentler way to increase core strength.   4. It's common to have issues with endurance and fatigue at this point- went over how this affects daily life.   5.  F/U 3 months   I spent a total of 25 minutes on visit- as detailed above.

## 2020-09-22 NOTE — Patient Instructions (Signed)
Patient is an 79 yr old female with Guillain Barre syndrome- hx of OSA, dCHF, chest tubes due to B/L pneumothorax due to Code blue/being bagged, seizure, hyper and hyponatremia- acute; S/P IVIG and plasmapharesis.  As well as R vocal cord paralysis. Now eating regular diet! And dysphagia has resolved.  Hand arthritis.   1. Voltaren gel over the counter can help hand swelling/arthritic pain- up to 4x/day.    2. Liver and gallbladder U/S 7-8 months from now- due to sludge and hepatic steatosis.   3. Suggest exercise ball- blow up til knees at 90 degrees-  Then just sit on it- for now- goal 30-60 minutes/day- then will work on extending reach, etc so can increase core strength.  Kinder, gentler way to increase core strength.   4. It's common to have issues with endurance and fatigue at this point- went over how this affects daily life.   5.  F/U 3 months

## 2020-09-23 DIAGNOSIS — R279 Unspecified lack of coordination: Secondary | ICD-10-CM | POA: Diagnosis not present

## 2020-09-23 DIAGNOSIS — R2689 Other abnormalities of gait and mobility: Secondary | ICD-10-CM | POA: Diagnosis not present

## 2020-09-23 DIAGNOSIS — M6281 Muscle weakness (generalized): Secondary | ICD-10-CM | POA: Diagnosis not present

## 2020-09-23 DIAGNOSIS — G610 Guillain-Barre syndrome: Secondary | ICD-10-CM | POA: Diagnosis not present

## 2020-09-27 DIAGNOSIS — M6281 Muscle weakness (generalized): Secondary | ICD-10-CM | POA: Diagnosis not present

## 2020-09-27 DIAGNOSIS — R2689 Other abnormalities of gait and mobility: Secondary | ICD-10-CM | POA: Diagnosis not present

## 2020-09-27 DIAGNOSIS — G610 Guillain-Barre syndrome: Secondary | ICD-10-CM | POA: Diagnosis not present

## 2020-09-27 DIAGNOSIS — R279 Unspecified lack of coordination: Secondary | ICD-10-CM | POA: Diagnosis not present

## 2020-09-28 DIAGNOSIS — R2689 Other abnormalities of gait and mobility: Secondary | ICD-10-CM | POA: Diagnosis not present

## 2020-09-28 DIAGNOSIS — R279 Unspecified lack of coordination: Secondary | ICD-10-CM | POA: Diagnosis not present

## 2020-09-28 DIAGNOSIS — M6281 Muscle weakness (generalized): Secondary | ICD-10-CM | POA: Diagnosis not present

## 2020-09-28 DIAGNOSIS — G610 Guillain-Barre syndrome: Secondary | ICD-10-CM | POA: Diagnosis not present

## 2020-09-29 ENCOUNTER — Ambulatory Visit: Payer: Medicare Other

## 2020-09-29 DIAGNOSIS — M6281 Muscle weakness (generalized): Secondary | ICD-10-CM | POA: Diagnosis not present

## 2020-09-29 DIAGNOSIS — R2689 Other abnormalities of gait and mobility: Secondary | ICD-10-CM | POA: Diagnosis not present

## 2020-09-29 DIAGNOSIS — R279 Unspecified lack of coordination: Secondary | ICD-10-CM | POA: Diagnosis not present

## 2020-09-29 DIAGNOSIS — G610 Guillain-Barre syndrome: Secondary | ICD-10-CM | POA: Diagnosis not present

## 2020-09-30 DIAGNOSIS — R2689 Other abnormalities of gait and mobility: Secondary | ICD-10-CM | POA: Diagnosis not present

## 2020-09-30 DIAGNOSIS — M6281 Muscle weakness (generalized): Secondary | ICD-10-CM | POA: Diagnosis not present

## 2020-09-30 DIAGNOSIS — G610 Guillain-Barre syndrome: Secondary | ICD-10-CM | POA: Diagnosis not present

## 2020-09-30 DIAGNOSIS — R279 Unspecified lack of coordination: Secondary | ICD-10-CM | POA: Diagnosis not present

## 2020-10-04 DIAGNOSIS — M6281 Muscle weakness (generalized): Secondary | ICD-10-CM | POA: Diagnosis not present

## 2020-10-04 DIAGNOSIS — R279 Unspecified lack of coordination: Secondary | ICD-10-CM | POA: Diagnosis not present

## 2020-10-04 DIAGNOSIS — R2689 Other abnormalities of gait and mobility: Secondary | ICD-10-CM | POA: Diagnosis not present

## 2020-10-04 DIAGNOSIS — G610 Guillain-Barre syndrome: Secondary | ICD-10-CM | POA: Diagnosis not present

## 2020-10-05 DIAGNOSIS — M6281 Muscle weakness (generalized): Secondary | ICD-10-CM | POA: Diagnosis not present

## 2020-10-05 DIAGNOSIS — G610 Guillain-Barre syndrome: Secondary | ICD-10-CM | POA: Diagnosis not present

## 2020-10-05 DIAGNOSIS — R279 Unspecified lack of coordination: Secondary | ICD-10-CM | POA: Diagnosis not present

## 2020-10-05 DIAGNOSIS — R2689 Other abnormalities of gait and mobility: Secondary | ICD-10-CM | POA: Diagnosis not present

## 2020-10-06 ENCOUNTER — Ambulatory Visit (INDEPENDENT_AMBULATORY_CARE_PROVIDER_SITE_OTHER): Payer: Medicare Other

## 2020-10-06 ENCOUNTER — Other Ambulatory Visit: Payer: Self-pay

## 2020-10-06 DIAGNOSIS — Z23 Encounter for immunization: Secondary | ICD-10-CM | POA: Diagnosis not present

## 2020-10-06 NOTE — Progress Notes (Signed)
Patient came in today for high dose flu vaccine. Patient did inform me that she has a hx of Guillain Barre Syndrome & that Dr. Allena Katz had cleared her to have influenza vaccine in her latest OV notes. After reviewing, I could see Dr. Eliane Decree note. Pt was given vaccine in left deltoid IM with no signs or symptoms of distress. Pt did wait 15 minutes at our clinic to make sure she had no reaction. Pt was fine & has no reaction after 15 minutes.

## 2020-10-07 DIAGNOSIS — Z20828 Contact with and (suspected) exposure to other viral communicable diseases: Secondary | ICD-10-CM | POA: Diagnosis not present

## 2020-10-11 DIAGNOSIS — R279 Unspecified lack of coordination: Secondary | ICD-10-CM | POA: Diagnosis not present

## 2020-10-11 DIAGNOSIS — M6281 Muscle weakness (generalized): Secondary | ICD-10-CM | POA: Diagnosis not present

## 2020-10-11 DIAGNOSIS — R2689 Other abnormalities of gait and mobility: Secondary | ICD-10-CM | POA: Diagnosis not present

## 2020-10-11 DIAGNOSIS — G610 Guillain-Barre syndrome: Secondary | ICD-10-CM | POA: Diagnosis not present

## 2020-10-12 DIAGNOSIS — G610 Guillain-Barre syndrome: Secondary | ICD-10-CM | POA: Diagnosis not present

## 2020-10-12 DIAGNOSIS — M6281 Muscle weakness (generalized): Secondary | ICD-10-CM | POA: Diagnosis not present

## 2020-10-12 DIAGNOSIS — R279 Unspecified lack of coordination: Secondary | ICD-10-CM | POA: Diagnosis not present

## 2020-10-12 DIAGNOSIS — R2689 Other abnormalities of gait and mobility: Secondary | ICD-10-CM | POA: Diagnosis not present

## 2020-10-13 ENCOUNTER — Ambulatory Visit (INDEPENDENT_AMBULATORY_CARE_PROVIDER_SITE_OTHER): Payer: Medicare Other

## 2020-10-13 ENCOUNTER — Telehealth: Payer: Self-pay | Admitting: Obstetrics and Gynecology

## 2020-10-13 DIAGNOSIS — I441 Atrioventricular block, second degree: Secondary | ICD-10-CM

## 2020-10-13 LAB — CUP PACEART REMOTE DEVICE CHECK
Battery Remaining Longevity: 133 mo
Battery Remaining Percentage: 95.5 %
Battery Voltage: 3.01 V
Brady Statistic AP VP Percent: 1 %
Brady Statistic AP VS Percent: 6.3 %
Brady Statistic AS VP Percent: 1 %
Brady Statistic AS VS Percent: 89 %
Brady Statistic RA Percent Paced: 1.9 %
Brady Statistic RV Percent Paced: 1 %
Date Time Interrogation Session: 20220112020013
Implantable Lead Implant Date: 20191022
Implantable Lead Implant Date: 20191022
Implantable Lead Location: 753859
Implantable Lead Location: 753860
Implantable Pulse Generator Implant Date: 20191022
Lead Channel Impedance Value: 560 Ohm
Lead Channel Impedance Value: 730 Ohm
Lead Channel Pacing Threshold Amplitude: 0.5 V
Lead Channel Pacing Threshold Amplitude: 0.75 V
Lead Channel Pacing Threshold Pulse Width: 0.5 ms
Lead Channel Pacing Threshold Pulse Width: 0.5 ms
Lead Channel Sensing Intrinsic Amplitude: 12 mV
Lead Channel Sensing Intrinsic Amplitude: 4.7 mV
Lead Channel Setting Pacing Amplitude: 1 V
Lead Channel Setting Pacing Amplitude: 2 V
Lead Channel Setting Pacing Pulse Width: 0.5 ms
Lead Channel Setting Sensing Sensitivity: 2 mV
Pulse Gen Model: 2272
Pulse Gen Serial Number: 9073848

## 2020-10-13 NOTE — Telephone Encounter (Signed)
Pt with hx of complex ovar cyst. Was supposed to have surgery over summer but canceled due to illness. Now feeling better and wants to know next step, ie surgery, office appt, new imaging.

## 2020-10-14 DIAGNOSIS — M6281 Muscle weakness (generalized): Secondary | ICD-10-CM | POA: Diagnosis not present

## 2020-10-14 DIAGNOSIS — G610 Guillain-Barre syndrome: Secondary | ICD-10-CM | POA: Diagnosis not present

## 2020-10-14 DIAGNOSIS — R2689 Other abnormalities of gait and mobility: Secondary | ICD-10-CM | POA: Diagnosis not present

## 2020-10-14 DIAGNOSIS — R279 Unspecified lack of coordination: Secondary | ICD-10-CM | POA: Diagnosis not present

## 2020-10-18 DIAGNOSIS — G610 Guillain-Barre syndrome: Secondary | ICD-10-CM | POA: Diagnosis not present

## 2020-10-18 DIAGNOSIS — M6281 Muscle weakness (generalized): Secondary | ICD-10-CM | POA: Diagnosis not present

## 2020-10-18 DIAGNOSIS — R279 Unspecified lack of coordination: Secondary | ICD-10-CM | POA: Diagnosis not present

## 2020-10-18 DIAGNOSIS — R2689 Other abnormalities of gait and mobility: Secondary | ICD-10-CM | POA: Diagnosis not present

## 2020-10-19 DIAGNOSIS — M6281 Muscle weakness (generalized): Secondary | ICD-10-CM | POA: Diagnosis not present

## 2020-10-19 DIAGNOSIS — R279 Unspecified lack of coordination: Secondary | ICD-10-CM | POA: Diagnosis not present

## 2020-10-19 DIAGNOSIS — R2689 Other abnormalities of gait and mobility: Secondary | ICD-10-CM | POA: Diagnosis not present

## 2020-10-19 DIAGNOSIS — G610 Guillain-Barre syndrome: Secondary | ICD-10-CM | POA: Diagnosis not present

## 2020-10-20 ENCOUNTER — Other Ambulatory Visit: Payer: Self-pay | Admitting: Obstetrics and Gynecology

## 2020-10-20 DIAGNOSIS — N83299 Other ovarian cyst, unspecified side: Secondary | ICD-10-CM

## 2020-10-20 DIAGNOSIS — N83202 Unspecified ovarian cyst, left side: Secondary | ICD-10-CM

## 2020-10-21 DIAGNOSIS — R279 Unspecified lack of coordination: Secondary | ICD-10-CM | POA: Diagnosis not present

## 2020-10-21 DIAGNOSIS — R2689 Other abnormalities of gait and mobility: Secondary | ICD-10-CM | POA: Diagnosis not present

## 2020-10-21 DIAGNOSIS — M6281 Muscle weakness (generalized): Secondary | ICD-10-CM | POA: Diagnosis not present

## 2020-10-21 DIAGNOSIS — G610 Guillain-Barre syndrome: Secondary | ICD-10-CM | POA: Diagnosis not present

## 2020-10-22 ENCOUNTER — Other Ambulatory Visit: Payer: Self-pay

## 2020-10-22 ENCOUNTER — Ambulatory Visit (INDEPENDENT_AMBULATORY_CARE_PROVIDER_SITE_OTHER): Payer: Medicare Other | Admitting: Internal Medicine

## 2020-10-22 ENCOUNTER — Encounter: Payer: Self-pay | Admitting: Nurse Practitioner

## 2020-10-22 VITALS — BP 142/86 | HR 78 | Ht 66.0 in | Wt 176.0 lb

## 2020-10-22 DIAGNOSIS — I4719 Other supraventricular tachycardia: Secondary | ICD-10-CM

## 2020-10-22 DIAGNOSIS — I10 Essential (primary) hypertension: Secondary | ICD-10-CM

## 2020-10-22 DIAGNOSIS — I441 Atrioventricular block, second degree: Secondary | ICD-10-CM | POA: Diagnosis not present

## 2020-10-22 DIAGNOSIS — I471 Supraventricular tachycardia: Secondary | ICD-10-CM | POA: Diagnosis not present

## 2020-10-22 NOTE — Progress Notes (Signed)
Electrophysiology Office Note Date: 10/22/2020  ID:  Alison Huffman, DOB [birth year], MRN [id number]  PCP: Glori Luis, MD Primary Cardiologist: Mariah Milling Electrophysiologist: Dejah Droessler  CC: Pacemaker follow-up  Alison Huffman is a 80 y.o. female seen today for Dr Johney Frame.  She presents today for routine electrophysiology followup.  She developed Guillain Barre syndrome in July of last year which ultimately resulted in intubation.  She has since recovered and continues with PT and OT.  She is doing well from a cardiac standpoint.   She denies chest pain, palpitations, dyspnea, PND, orthopnea, nausea, vomiting, syncope, edema, weight gain, or early satiety.  Device History: STJ dual chamber PPM implanted 2019 for Mobitz II    Past Medical History:  Diagnosis Date  . Ankle pain, right   . Atypical chest pain    a. 10/2018 MV: EF 57%. No ischemia/infarct.  . Coronary artery disease   . Diastolic dysfunction    a. 04/2018 Echo: EF 55-60%, no rwma, Gr1 DD. Triv AI, mild MR. Mod dil LA. Nl RV fxn. PASP nl.  . GERD (gastroesophageal reflux disease)   . Headache    "dull one sometimes daily, at least weekly in last couple months" (07/22/2018)  . Hyperlipidemia   . Hypertension   . Inflammatory neuropathy ([facility])   . Joint pain in fingers of right hand   . Lichen sclerosus   . Osteopenia   . Pneumonia 2012? X 1  . 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 Psoriasis   . Psoriatic arthritis ([facility])    " dx'd the 1st of this year, 2019" (07/22/2018)  . Psoriatic arthritis ([facility])   . Rosacea   . Seborrheic keratosis   . Second degree heart block    a. 07/2018 s/p SJM Assurity MRI model PM2271 (Ser# [id number]).  . Sleep apnea    "using nasal pilllows since ~ 12/2017" (07/22/2018)  . TMJ (dislocation of temporomandibular joint)    Past Surgical History:  Procedure Laterality Date  . APPENDECTOMY    . BREAST CYST ASPIRATION Bilateral   . BREAST CYST EXCISION Right 1978    benign  . COLONOSCOPY    . COLONOSCOPY WITH PROPOFOL N/A 07/10/2019   Procedure: COLONOSCOPY WITH PROPOFOL;  Surgeon: Christena Deem, MD;  Location: [facility] ENDOSCOPY;  Service: Endoscopy;  Laterality: N/A;  . ESOPHAGOGASTRODUODENOSCOPY (EGD) WITH PROPOFOL N/A 07/10/2019   Procedure: ESOPHAGOGASTRODUODENOSCOPY (EGD) WITH PROPOFOL;  Surgeon: Christena Deem, MD;  Location: [facility] ENDOSCOPY;  Service: Endoscopy;  Laterality: N/A;  . HYSTEROSCOPY WITH D & C N/A 11/05/2018   Procedure: DILATATION AND CURETTAGE /HYSTEROSCOPY;  Surgeon: Natale Milch, MD;  Location: [facility] ORS;  Service: Gynecology;  Laterality: N/A;  . INSERT / REPLACE / REMOVE PACEMAKER    . PACEMAKER IMPLANT N/A 07/23/2018   SJM Assurity [id number] implanted by Dr Johney Frame for mobitz II second degree AV block  . UTERINE POLYPS REMOVAL      Current Outpatient Medications  Medication Sig Dispense Refill  . acetaminophen (TYLENOL) 325 MG tablet Take 325-650 mg by mouth every 6 (six) hours as needed for mild pain or headache.     . bisacodyl (DULCOLAX) 10 MG suppository Place 1 suppository (10 mg total) rectally daily as needed for moderate constipation. 12 suppository 0  . hydrocerin (EUCERIN) CREA Apply 1 application topically 2 (two) times daily. To bilateral feet.  0  . ketoconazole (NIZORAL) 2 % cream Apply 1 application topically daily. 15 g 0  . losartan (COZAAR) 25  MG tablet Take 1 tablet (25 mg total) by mouth daily. 90 tablet 1  . polyethylene glycol (MIRALAX / GLYCOLAX) 17 g packet Take 17 g by mouth daily as needed for severe constipation. 14 each 0  . senna-docusate (SENOKOT-S) 8.6-50 MG tablet Take 1 tablet by mouth at bedtime as needed for mild constipation.    . tacrolimus (PROTOPIC) 0.1 % ointment Apply topically 2 (two) times daily as needed (for psoriasis).     . Vitamin D, Ergocalciferol, 50 MCG (2000 UT) CAPS Take by mouth.     No current facility-administered medications for this visit.    Allergies:    Etanercept and Nickel   Social History: Social History   Socioeconomic History  . Marital status: Single    Spouse name: Not on file  . Number of children: Not on file  . Years of education: Not on file  . Highest education level: Not on file  Occupational History  . Not on file  Tobacco Use  . Smoking status: Never Smoker  . Smokeless tobacco: Never Used  . Tobacco comment: 07/22/2018 "smoked some in college; 1960s;  nothing since"  Vaping Use  . Vaping Use: Never used  Substance and Sexual Activity  . Alcohol use: Yes    Comment: rarely  . Drug use: Never  . Sexual activity: Not Currently    Birth control/protection: Post-menopausal  Other Topics Concern  . Not on file  Social History Narrative   Right Handed   Lives in an apartment. 5th floor but has elevator    Social Determinants of Health   Financial Resource Strain: Low Risk   . Difficulty of Paying Living Expenses: Not hard at all  Food Insecurity: No Food Insecurity  . Worried About Charity fundraiser in the Last Year: Never true  . Ran Out of Food in the Last Year: Never true  Transportation Needs: No Transportation Needs  . Lack of Transportation (Medical): No  . Lack of Transportation (Non-Medical): No  Physical Activity: Sufficiently Active  . Days of Exercise per Week: 4 days  . Minutes of Exercise per Session: 60 min  Stress: No Stress Concern Present  . Feeling of Stress : Not at all  Social Connections: Not on file  Intimate Partner Violence: Not At Risk  . Fear of Current or Ex-Partner: No  . Emotionally Abused: No  . Physically Abused: No  . Sexually Abused: No    Family History: Family History  Problem Relation Age of Onset  . Stroke Mother   . Atrial fibrillation Mother   . Breast cancer Mother   . Stroke Father   . Breast cancer Maternal Aunt   . Breast cancer Paternal Aunt      Review of Systems: All other systems reviewed and are otherwise negative except as noted  above.   Physical Exam: VS:  BP (!) 142/86   Pulse 78   Ht 5\' 6"  (1.676 m)   Wt 176 lb (79.8 kg)   SpO2 98%   BMI 28.41 kg/m  , BMI Body mass index is 28.41 kg/m.  GEN- The patient is well appearing, alert and oriented x 3 today.   HEENT: normocephalic, atraumatic; sclera clear, conjunctiva pink; hearing intact; oropharynx clear; neck supple  Lungs- Clear to ausculation bilaterally, normal work of breathing.  No wheezes, rales, rhonchi Heart- Regular rate and rhythm  GI- soft, non-tender, non-distended, bowel sounds present  Extremities- no clubbing, cyanosis, or edema  MS- no significant deformity or  atrophy Skin- warm and dry, no rash or lesion; PPM pocket well healed Psych- euthymic mood, full affect Neuro- strength and sensation are intact  PPM Interrogation- reviewed in detail today,  See PACEART report  EKG:  EKG is not ordered today.  Recent Labs: 05/01/2020: Magnesium 2.1 06/21/2020: ALT 13; Hemoglobin 12.2; Platelets 252.0; TSH 1.20 08/17/2020: BUN 14; Creatinine, Ser 0.50; Potassium 4.2; Sodium 137   Wt Readings from Last 3 Encounters:  10/22/20 176 lb (79.8 kg)  09/22/20 174 lb (78.9 kg)  09/21/20 170 lb (77.1 kg)      Assessment and Plan:  1.  Mobitz II  Normal PPM function See Pace Art report No changes today  2.  Atrial tachycardia Burden remains low by device interrogation  3.  HTN Stable No change required today  4.  PVC's Noted on device interrogation Will follow for now EF normal at echo 04/2020    Current medicines are reviewed at length with the patient today.   The patient does not have concerns regarding her medicines.  The following changes were made today:  none  Labs/ tests ordered today include: none No orders of the defined types were placed in this encounter.    Disposition:   Follow up with Delilah Shan, EP NP in  1 year    Army Fossa MD 10/22/2020 12:04 PM  [facility] [address] [place] [address] [phone] (office) [phone] (fax)

## 2020-10-22 NOTE — Patient Instructions (Signed)
Medication Instructions:  *If you need a refill on your cardiac medications before your next appointment, please call your pharmacy*  Follow-Up: At [facility], you and your health needs are our priority.  As part of our continuing mission to provide you with exceptional heart care, we have created designated Provider Care Teams.  These Care Teams include your primary Cardiologist (physician) and Advanced Practice Providers (APPs -  Physician Assistants and Nurse Practitioners) who all work together to provide you with the care you need, when you need it.  We recommend signing up for the patient portal called "MyChart".  Sign up information is provided on this After Visit Summary.  MyChart is used to connect with patients for Virtual Visits (Telemedicine).  Patients are able to view lab/test results, encounter notes, upcoming appointments, etc.  Non-urgent messages can be sent to your provider as well.   To learn more about what you can do with MyChart, go to [email or web address].    Your next appointment:   Your physician wants you to follow-up in: 1 YEAR with Chanetta Marshall, NP. You will receive a reminder letter in the mail two months in advance. If you don't receive a letter, please call our office to schedule the follow-up appointment.  Remote monitoring is used to monitor your Pacemaker from home. This monitoring reduces the number of office visits required to check your device to one time per year. It allows [place] to keep an eye on the functioning of your device to ensure it is working properly. You are scheduled for a device check from home on 01/12/21. You may send your transmission at any time that day. If you have a wireless device, the transmission will be sent automatically. After your physician reviews your transmission, you will receive a postcard with your next transmission date.  The format for your next appointment:   In Person with Chanetta Marshall, NP

## 2020-10-25 DIAGNOSIS — R2689 Other abnormalities of gait and mobility: Secondary | ICD-10-CM | POA: Diagnosis not present

## 2020-10-25 DIAGNOSIS — R279 Unspecified lack of coordination: Secondary | ICD-10-CM | POA: Diagnosis not present

## 2020-10-25 DIAGNOSIS — G610 Guillain-Barre syndrome: Secondary | ICD-10-CM | POA: Diagnosis not present

## 2020-10-25 DIAGNOSIS — M6281 Muscle weakness (generalized): Secondary | ICD-10-CM | POA: Diagnosis not present

## 2020-10-26 ENCOUNTER — Other Ambulatory Visit: Payer: Self-pay

## 2020-10-26 ENCOUNTER — Ambulatory Visit
Admission: RE | Admit: 2020-10-26 | Discharge: 2020-10-26 | Disposition: A | Discharge status: Home / Self Care | Payer: Medicare Other | Source: Ambulatory Visit | Attending: Obstetrics and Gynecology | Admitting: Obstetrics and Gynecology

## 2020-10-26 DIAGNOSIS — N838 Other noninflammatory disorders of ovary, fallopian tube and broad ligament: Secondary | ICD-10-CM | POA: Diagnosis not present

## 2020-10-26 DIAGNOSIS — Z780 Asymptomatic menopausal state: Secondary | ICD-10-CM | POA: Diagnosis not present

## 2020-10-26 DIAGNOSIS — N83202 Unspecified ovarian cyst, left side: Secondary | ICD-10-CM | POA: Insufficient documentation

## 2020-10-26 DIAGNOSIS — N83299 Other ovarian cyst, unspecified side: Secondary | ICD-10-CM | POA: Insufficient documentation

## 2020-10-26 DIAGNOSIS — N888 Other specified noninflammatory disorders of cervix uteri: Secondary | ICD-10-CM | POA: Diagnosis not present

## 2020-10-26 NOTE — Progress Notes (Signed)
Remote pacemaker transmission.   

## 2020-10-27 ENCOUNTER — Other Ambulatory Visit: Payer: Medicare Other

## 2020-10-27 DIAGNOSIS — N83202 Unspecified ovarian cyst, left side: Secondary | ICD-10-CM | POA: Diagnosis not present

## 2020-10-27 DIAGNOSIS — N83299 Other ovarian cyst, unspecified side: Secondary | ICD-10-CM

## 2020-10-28 DIAGNOSIS — M6281 Muscle weakness (generalized): Secondary | ICD-10-CM | POA: Diagnosis not present

## 2020-10-28 DIAGNOSIS — G610 Guillain-Barre syndrome: Secondary | ICD-10-CM | POA: Diagnosis not present

## 2020-10-28 DIAGNOSIS — R2689 Other abnormalities of gait and mobility: Secondary | ICD-10-CM | POA: Diagnosis not present

## 2020-10-28 DIAGNOSIS — R279 Unspecified lack of coordination: Secondary | ICD-10-CM | POA: Diagnosis not present

## 2020-10-28 LAB — OVARIAN MALIGNANCY RISK-ROMA
Cancer Antigen (CA) 125: 6.3 U/mL (ref 0.0–38.1)
HE4: 77 pmol/L (ref 0.0–96.9)
Postmenopausal ROMA: 0.98
Premenopausal ROMA: 1.76 — ABNORMAL HIGH

## 2020-10-28 LAB — PREMENOPAUSAL INTERP: HIGH

## 2020-10-28 LAB — POSTMENOPAUSAL INTERP: LOW

## 2020-11-01 ENCOUNTER — Other Ambulatory Visit (HOSPITAL_COMMUNITY)
Admission: RE | Admit: 2020-11-01 | Discharge: 2020-11-01 | Disposition: A | Discharge status: Home / Self Care | Payer: Medicare Other | Source: Ambulatory Visit | Attending: Obstetrics and Gynecology | Admitting: Obstetrics and Gynecology

## 2020-11-01 ENCOUNTER — Ambulatory Visit (INDEPENDENT_AMBULATORY_CARE_PROVIDER_SITE_OTHER): Payer: Medicare Other | Admitting: Obstetrics and Gynecology

## 2020-11-01 ENCOUNTER — Encounter: Payer: Self-pay | Admitting: Obstetrics and Gynecology

## 2020-11-01 ENCOUNTER — Other Ambulatory Visit: Payer: Self-pay

## 2020-11-01 VITALS — BP 126/70 | Ht 66.0 in | Wt 176.0 lb

## 2020-11-01 DIAGNOSIS — R2689 Other abnormalities of gait and mobility: Secondary | ICD-10-CM | POA: Diagnosis not present

## 2020-11-01 DIAGNOSIS — N83299 Other ovarian cyst, unspecified side: Secondary | ICD-10-CM

## 2020-11-01 DIAGNOSIS — N950 Postmenopausal bleeding: Secondary | ICD-10-CM | POA: Diagnosis not present

## 2020-11-01 DIAGNOSIS — R279 Unspecified lack of coordination: Secondary | ICD-10-CM | POA: Diagnosis not present

## 2020-11-01 DIAGNOSIS — R9389 Abnormal findings on diagnostic imaging of other specified body structures: Secondary | ICD-10-CM | POA: Insufficient documentation

## 2020-11-01 DIAGNOSIS — G610 Guillain-Barre syndrome: Secondary | ICD-10-CM | POA: Diagnosis not present

## 2020-11-01 DIAGNOSIS — M6281 Muscle weakness (generalized): Secondary | ICD-10-CM | POA: Diagnosis not present

## 2020-11-01 NOTE — Progress Notes (Signed)
Patient ID: Alison Huffman, female   DOB: [birth year], 80 y.o.   MRN: [id number]  Reason for Consult: Gynecologic Exam   Referred by Leone Haven, MD  Subjective:     HPI:  Alison Huffman is a 80 y.o. female. She has been following for a complex left ovarian cyst for over 2 years. This cyst was seen incidentally at the time of a pelvic US for postmenopausal bleeding. Pathology from her D&C was normal and the patient elected for monitoring of the ovarian cyst.  Results as follows:   09/30/2018: Left ovary 2.3x2.2x1.8 cm Questionable hypoechoic lesion 1.9cm Endometrium/ endocervix thickened and heterogeneous  8.6 mm 12/11/2018: Left ovary measurements: 2.2 x 1.4 x 1.4 cm = volume: 2.2 mL. 0.9 x 0.6 x 0.6 cm shadowing echogenic lesion seen adjacent to the left ovary, indeterminate. Additionally, although not delineated by the technologist, there is question of a hypoechoic complex cystic lesion measuring approximately 1.8 cm (image 84). No appreciable solid component.  Endometrium Thickness: 2.1 mm. Focal echogenic area involving the endometrial stripe at the level of the mid uterine body measures 4 x 4 x 5 mm. No associated vascularity. Additional somewhat isodense hypoechoic lesion at the level of the lower uterine segment/cervix measures 1.9 x 0.8 x 1.0 cm, indeterminate. 07/02/2019: Endometrium Thickness: 5 mm. Hypoechoic focus centrally within the endometrial canal with minimal surrounding hyperechogenicity, 7 x 7 x 9 mm question polyp. Additionally, a more heterogeneous area of cystic and solid-appearing tissue is identified at the lower uterine segment endometrial/endocervical canal, 17 x 11 x 16 mm, grossly unchanged, question polyp. Left ovary Measurements: 2.1 x 1.6 x 1.7 cm = volume: 3.0 mL. Fairly homogeneous hypoechoic nodule within LEFT lobe read, somewhat macrolobulated, suspect complex cystic/hemorrhagic nodule, with a potential small mural nodule 9 x 3 x 9 mm in  size. No definite blood flow within the mural nodule color Doppler imaging. 12/11/2019: Endometrium Thickness: 4 mm. There is a focal masslike area of thickening involving the endometrium measuring 7 mm.  Left ovary Measurements: 2.5 x 1.8 x 1.8 cm = volume: 4.2 mL. Within the [place] there is a homogeneous hypoechoic mass which measures 2.3 x 1.6 x 1.9 cm, previously 2.1 x 1.6 x 1.7 cm. 03/04/2020: Endometrium Thickness: 4 mm. Focal hyperechoic nodule identified within mid segment endometrial canal to the LEFT measuring 7 x 5 x 5 mm suspicious for polyp. No endometrial fluid.  Left ovary Measurements: 3.0 x 1.4 x 1.7 cm = volume: 3.7 mL. Hypoechoic complicated cystic lesion of the LEFT ovary 2.5 x 1.8 x 2.0 cm (previously 2.3 x 1.6 x 1.9 cm), containing a small septation which appears to have internal blood flow on color Doppler imaging. Small mural nodule as well. Findings represents a complicated cystic lesion and malignancy is not excluded with this appearance. 10/26/2020:  Endometrium Thickness: 5 mm. Probable polyp at upper uterine segment endometrial canal 6 x 8 x 6 mm, previously 7 mm diameter. No endometrial fluid.  Left ovary Measurements: 3.1 x 2.1 x 2.0 cm = volume: 6.9 mL. Complex hypoechoic nodule within LEFT ovary 2.5 x 1.6 x 2.0 cm in size, containing fairly homogeneous low level internal echogenicity and a small eccentric brightly echogenic nonshadowing focus of. Ovarian neoplasm is not excluded. Lesion has not significantly changed in size since the previous exam. On color Doppler imaging, scattered areas of internal flow are identified. Lesion is suspicious for an ovarian neoplasm.   Past Medical History:  Diagnosis Date  . Ankle  pain, right   . Atypical chest pain    a. 10/2018 MV: EF 57%. No ischemia/infarct.  . Coronary artery disease   . Diastolic dysfunction    a. 04/2018 Echo: EF 55-60%, no rwma, Gr1 DD. Triv AI, mild MR. Mod dil LA. Nl RV fxn. PASP nl.  . GERD  (gastroesophageal reflux disease)   . Headache    "dull one sometimes daily, at least weekly in last couple months" (07/22/2018)  . Hyperlipidemia   . Hypertension   . 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 Joint pain in fingers of right hand   . Lichen sclerosus   . Osteopenia   . Pneumonia 2012? X 1  . 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 Psoriasis   . Psoriatic arthritis ([place])    " dx'd the 1st of this year, 2019" (07/22/2018)  . Psoriatic arthritis ([place])   . Rosacea   . Seborrheic keratosis   . Second degree heart block    a. 07/2018 s/p SJM Assurity MRI model PM2271 (Ser# [id number]).  . Sleep apnea    "using nasal pilllows since ~ 12/2017" (07/22/2018)  . TMJ (dislocation of temporomandibular joint)    Family History  Problem Relation Age of Onset  . Stroke Mother   . Atrial fibrillation Mother   . Breast cancer Mother   . Stroke Father   . Breast cancer Maternal Aunt   . Breast cancer Paternal Aunt    Past Surgical History:  Procedure Laterality Date  . APPENDECTOMY    . BREAST CYST ASPIRATION Bilateral   . BREAST CYST EXCISION Right 1978   benign  . COLONOSCOPY    . COLONOSCOPY WITH PROPOFOL N/A 07/10/2019   Procedure: COLONOSCOPY WITH PROPOFOL;  Surgeon: Lollie Sails, MD;  Location: [facility] ENDOSCOPY;  Service: Endoscopy;  Laterality: N/A;  . ESOPHAGOGASTRODUODENOSCOPY (EGD) WITH PROPOFOL N/A 07/10/2019   Procedure: ESOPHAGOGASTRODUODENOSCOPY (EGD) WITH PROPOFOL;  Surgeon: Lollie Sails, MD;  Location: [facility] ENDOSCOPY;  Service: Endoscopy;  Laterality: N/A;  . HYSTEROSCOPY WITH D & C N/A 11/05/2018   Procedure: DILATATION AND CURETTAGE /HYSTEROSCOPY;  Surgeon: Homero Fellers, MD;  Location: [facility] ORS;  Service: Gynecology;  Laterality: N/A;  . INSERT / REPLACE / REMOVE PACEMAKER    . 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 UTERINE POLYPS REMOVAL      Short Social  History:  Social History   Tobacco Use  . Smoking status: Never Smoker  . Smokeless tobacco: Never Used  . Tobacco comment: 07/22/2018 "smoked some in college; 1960s;  nothing since"  Substance Use Topics  . Alcohol use: Yes    Comment: rarely    Allergies  Allergen Reactions  . Etanercept     Guillain Baree  . Nickel     Current Outpatient Medications  Medication Sig Dispense Refill  . acetaminophen (TYLENOL) 325 MG tablet Take 325-650 mg by mouth every 6 (six) hours as needed for mild pain or headache.     . bisacodyl (DULCOLAX) 10 MG suppository Place 1 suppository (10 mg total) rectally daily as needed for moderate constipation. 12 suppository 0  . hydrocerin (EUCERIN) CREA Apply 1 application topically 2 (two) times daily. To bilateral feet.  0  . ketoconazole (NIZORAL) 2 % cream Apply 1 application topically daily. 15 g 0  . losartan (COZAAR) 25 MG tablet Take 1 tablet (25 mg total) by mouth daily. 90 tablet 1  .  polyethylene glycol (MIRALAX / GLYCOLAX) 17 g packet Take 17 g by mouth daily as needed for severe constipation. 14 each 0  . senna-docusate (SENOKOT-S) 8.6-50 MG tablet Take 1 tablet by mouth at bedtime as needed for mild constipation.    . tacrolimus (PROTOPIC) 0.1 % ointment Apply topically 2 (two) times daily as needed (for psoriasis).     . Vitamin D, Ergocalciferol, 50 MCG (2000 UT) CAPS Take by mouth.     No current facility-administered medications for this visit.    Review of Systems  Constitutional: Negative for chills, fatigue, fever and unexpected weight change.  HENT: Negative for trouble swallowing.  Eyes: Negative for loss of vision.  Respiratory: Negative for cough, shortness of breath and wheezing.  Cardiovascular: Negative for chest pain, leg swelling, palpitations and syncope.  GI: Negative for abdominal pain, blood in stool, diarrhea, nausea and vomiting.  GU: Negative for difficulty urinating, dysuria, frequency and hematuria.   Musculoskeletal: Negative for back pain, leg pain and joint pain.  Skin: Negative for rash.  Neurological: Negative for dizziness, headaches, light-headedness, numbness and seizures.  Psychiatric: Negative for behavioral problem, confusion, depressed mood and sleep disturbance.        Objective:  Objective   Vitals:   11/01/20 1518  BP: 126/70  Weight: 176 lb (79.8 kg)  Height: 5\' 6"  (1.676 m)   Body mass index is 28.41 kg/m.  Physical Exam Vitals and nursing note reviewed.  Constitutional:      Appearance: She is well-developed and well-nourished.  HENT:     Head: Normocephalic and atraumatic.  Eyes:     Extraocular Movements: EOM normal.     Pupils: Pupils are equal, round, and reactive to light.  Cardiovascular:     Rate and Rhythm: Normal rate and regular rhythm.  Pulmonary:     Effort: Pulmonary effort is normal. No respiratory distress.  Genitourinary:    Comments: External: Normal appearing vulva. No lesions noted.  Speculum examination: Normal appearing cervix. No blood in the vaginal vault. No discharge.   Skin:    General: Skin is warm and dry.  Neurological:     Mental Status: She is alert and oriented to person, place, and time.  Psychiatric:        Mood and Affect: Mood and affect normal.        Behavior: Behavior normal.        Thought Content: Thought content normal.        Judgment: Judgment normal.    Endometrial Biopsy After discussion with the patient regarding her abnormal uterine bleeding I recommended that she proceed with an endometrial biopsy for further diagnosis. The risks, benefits, alternatives, and indications for an endometrial biopsy were discussed with the patient in detail. She understood the risks including infection, bleeding, cervical laceration and uterine perforation.  Verbal consent was obtained.   PROCEDURE NOTE:  [place] endometrial biopsy was performed using aseptic technique with iodine preparation.  The uterus was sounded  to a length of 7 cm.  Adequate sampling was obtained with minimal blood loss.  The patient tolerated the procedure well.  Disposition will be pending pathology.    Assessment/Plan:     80 yo with complex left ovarian cyst and thickened endometrium and a history of PMB. Discussed and offered patient surgical removal but she prefers continued expectant management at this time for at least 1-2 months.  Since it has been two years since her last sampling of the endometrium performed EMB today.  Will  refer to GYN oncology for assessment of surgical risk and their opinion of continued monitoring versus surgery.   More than 20 minutes were spent face to face with the patient in the room, reviewing the medical record, labs and images, and coordinating care for the patient. The plan of management was discussed in detail and counseling was provided.       Adrian Prows MD [facility] 11/01/2020 4:07 PM

## 2020-11-01 NOTE — Progress Notes (Signed)
[place] [facility] follow up

## 2020-11-02 ENCOUNTER — Telehealth: Payer: Self-pay

## 2020-11-02 NOTE — Telephone Encounter (Signed)
Received referral to have Alison Huffman evaluated for complex ovarian cyst. Voicemail left to return call for scheduling.

## 2020-11-03 ENCOUNTER — Telehealth: Payer: Self-pay

## 2020-11-03 LAB — SURGICAL PATHOLOGY

## 2020-11-03 NOTE — Telephone Encounter (Signed)
Spoke with Ms. Hendrix. She has been scheduled for 11/17/20 with Dr. Fransisca Connors.

## 2020-11-04 ENCOUNTER — Other Ambulatory Visit: Payer: Self-pay

## 2020-11-04 DIAGNOSIS — R279 Unspecified lack of coordination: Secondary | ICD-10-CM | POA: Diagnosis not present

## 2020-11-04 DIAGNOSIS — G610 Guillain-Barre syndrome: Secondary | ICD-10-CM | POA: Diagnosis not present

## 2020-11-04 DIAGNOSIS — M6281 Muscle weakness (generalized): Secondary | ICD-10-CM | POA: Diagnosis not present

## 2020-11-04 DIAGNOSIS — R2689 Other abnormalities of gait and mobility: Secondary | ICD-10-CM | POA: Diagnosis not present

## 2020-11-08 ENCOUNTER — Ambulatory Visit: Payer: Medicare Other | Admitting: Family Medicine

## 2020-11-08 DIAGNOSIS — M6281 Muscle weakness (generalized): Secondary | ICD-10-CM | POA: Diagnosis not present

## 2020-11-08 DIAGNOSIS — R279 Unspecified lack of coordination: Secondary | ICD-10-CM | POA: Diagnosis not present

## 2020-11-08 DIAGNOSIS — G610 Guillain-Barre syndrome: Secondary | ICD-10-CM | POA: Diagnosis not present

## 2020-11-08 DIAGNOSIS — R2689 Other abnormalities of gait and mobility: Secondary | ICD-10-CM | POA: Diagnosis not present

## 2020-11-09 ENCOUNTER — Encounter: Payer: Self-pay | Admitting: Internal Medicine

## 2020-11-09 ENCOUNTER — Other Ambulatory Visit: Payer: Self-pay

## 2020-11-09 ENCOUNTER — Ambulatory Visit (INDEPENDENT_AMBULATORY_CARE_PROVIDER_SITE_OTHER): Payer: Medicare Other | Admitting: Internal Medicine

## 2020-11-09 VITALS — BP 126/68 | HR 78 | Temp 97.6°F | Ht 66.0 in | Wt 173.2 lb

## 2020-11-09 DIAGNOSIS — J479 Bronchiectasis, uncomplicated: Secondary | ICD-10-CM

## 2020-11-09 DIAGNOSIS — I10 Essential (primary) hypertension: Secondary | ICD-10-CM | POA: Diagnosis not present

## 2020-11-09 DIAGNOSIS — E559 Vitamin D deficiency, unspecified: Secondary | ICD-10-CM

## 2020-11-09 DIAGNOSIS — I700 Atherosclerosis of aorta: Secondary | ICD-10-CM | POA: Diagnosis not present

## 2020-11-09 DIAGNOSIS — J984 Other disorders of lung: Secondary | ICD-10-CM | POA: Insufficient documentation

## 2020-11-09 LAB — CBC WITH DIFFERENTIAL/PLATELET
Basophils Absolute: 0 10*3/uL (ref 0.0–0.1)
Basophils Relative: 0.8 % (ref 0.0–3.0)
Eosinophils Absolute: 0.1 10*3/uL (ref 0.0–0.7)
Eosinophils Relative: 2.6 % (ref 0.0–5.0)
HCT: 40.8 % (ref 36.0–46.0)
Hemoglobin: 13.2 g/dL (ref 12.0–15.0)
Lymphocytes Relative: 27.2 % (ref 12.0–46.0)
Lymphs Abs: 1.5 10*3/uL (ref 0.7–4.0)
MCHC: 32.4 g/dL (ref 30.0–36.0)
MCV: 93.4 fl (ref 78.0–100.0)
Monocytes Absolute: 0.4 10*3/uL (ref 0.1–1.0)
Monocytes Relative: 7.1 % (ref 3.0–12.0)
Neutro Abs: 3.3 10*3/uL (ref 1.4–7.7)
Neutrophils Relative %: 62.3 % (ref 43.0–77.0)
Platelets: 225 10*3/uL (ref 150.0–400.0)
RBC: 4.37 Mil/uL (ref 3.87–5.11)
RDW: 14.6 % (ref 11.5–15.5)
WBC: 5.3 10*3/uL (ref 4.0–10.5)

## 2020-11-09 LAB — COMPREHENSIVE METABOLIC PANEL
ALT: 12 U/L (ref 0–35)
AST: 13 U/L (ref 0–37)
Albumin: 4 g/dL (ref 3.5–5.2)
Alkaline Phosphatase: 104 U/L (ref 39–117)
BUN: 11 mg/dL (ref 6–23)
CO2: 26 mEq/L (ref 19–32)
Calcium: 9.7 mg/dL (ref 8.4–10.5)
Chloride: 106 mEq/L (ref 96–112)
Creatinine, Ser: 0.55 mg/dL (ref 0.40–1.20)
GFR: 86.21 mL/min (ref >60.00)
Glucose, Bld: 81 mg/dL (ref 70–99)
Potassium: 4.2 mEq/L (ref 3.5–5.1)
Sodium: 140 mEq/L (ref 135–145)
Total Bilirubin: 0.6 mg/dL (ref 0.2–1.2)
Total Protein: 7 g/dL (ref 6.0–8.3)

## 2020-11-09 LAB — LIPID PANEL
Cholesterol: 234 mg/dL — ABNORMAL HIGH (ref 0–200)
HDL: 52.8 mg/dL (ref >39.00)
LDL Cholesterol: 159 mg/dL — ABNORMAL HIGH (ref 0–99)
NonHDL: 180.95
Total CHOL/HDL Ratio: 4
Triglycerides: 112 mg/dL (ref 0.0–149.0)
VLDL: 22.4 mg/dL (ref 0.0–40.0)

## 2020-11-09 LAB — VITAMIN D 25 HYDROXY (VIT D DEFICIENCY, FRACTURES): VITD: 43.75 ng/mL (ref 30.00–100.00)

## 2020-11-09 MED ORDER — LOSARTAN POTASSIUM 25 MG PO TABS: 25.0000 mg | ORAL_TABLET | Freq: Every day | ORAL | 3 refills | Status: DC

## 2020-11-09 NOTE — Patient Instructions (Addendum)
coppertone compression knee highs [employer]  Consider albuterol inhaler 1-2 puffs as needed   Varicose Veins Varicose veins are veins that have become enlarged, bulged, and twisted. They most often appear in the legs. What are the causes? This condition is caused by damage to the valves in the vein. These valves help blood return to your heart. When they are damaged and they stop working properly, blood may flow backward and back up in the veins near the skin, causing the veins to get larger and appear twisted. The condition can result from any issue that causes blood to back up, like pregnancy, prolonged standing, or obesity. What increases the risk? This condition is more likely to develop in people who are:  On their feet a lot.  Pregnant.  Overweight. What are the signs or symptoms? Symptoms of this condition include:  Bulging, twisted, and bluish veins.  A feeling of heaviness. This may be worse at the end of the day.  Leg pain. This may be worse at the end of the day.  Swelling in the leg.  Changes in skin color over the veins. How is this diagnosed? This condition may be diagnosed based on your symptoms, a physical exam, and an ultrasound test. How is this treated? Treatment for this condition may involve:  Avoiding sitting or standing in one position for long periods of time.  Wearing compression stockings. These stockings help to prevent blood clots and reduce swelling in the legs.  Raising (elevating) the legs when resting.  Losing weight.  Exercising regularly. If you have persistent symptoms or want to improve the way your varicose veins look, you may choose to have a procedure to close the varicose veins off or to remove them. Treatments to close off the veins include:  Sclerotherapy. In this treatment, a solution is injected into a vein to close it off.  Laser treatment. In this treatment, the vein is heated with a laser to close it off.  Radiofrequency  vein ablation. In this treatment, an electrical current produced by radio waves is used to close off the vein. Treatments to remove the veins include:  Phlebectomy. In this treatment, the veins are removed through small incisions made over the veins.  Vein ligation and stripping. In this treatment, incisions are made over the veins. The veins are then removed after being tied (ligated) with stitches (sutures). Follow these instructions at home: Activity  Walk as much as possible. Walking increases blood flow. This helps blood return to the heart and takes pressure off your veins. It also increases your cardiovascular strength.  Follow your health care provider's instructions about exercising.  Do not stand or sit in one position for a long period of time.  Do not sit with your legs crossed.  Rest with your legs raised during the day. General instructions  Follow any diet instructions given to you by your health care provider.  Wear compression stockings as directed by your health care provider. Do not wear other kinds of tight clothing around your legs, pelvis, or waist.  Elevate your legs at night to above the level of your heart.  If you get a cut in the skin over the varicose vein and the vein bleeds: ? Lie down with your leg raised. ? Apply firm pressure to the cut with a clean cloth until the bleeding stops. ? Place a bandage (dressing) on the cut.   Contact a health care provider if:  The skin around your varicose veins starts to  break down.  You have pain, redness, tenderness, or hard swelling over a vein.  You are uncomfortable because of pain.  You get a cut in the skin over a varicose vein and it will not stop bleeding. Summary  Varicose veins are veins that have become enlarged, bulged, and twisted. They most often appear in the legs.  This condition is caused by damage to the valves in the vein. These valves help blood return to your heart.  Treatment for this  condition includes frequent movements, wearing compression stockings, losing weight, and exercising regularly. In some cases, procedures are done to close off or remove the veins.  Treatment for this condition may include wearing compression stockings, elevating the legs, losing weight, and engaging in regular activity. In some cases, procedures are done to close off or remove the veins. This information is not intended to replace advice given to you by your health care provider. Make sure you discuss any questions you have with your health care provider. Document Revised: 01/29/2020 Document Reviewed: 01/29/2020 Elsevier Patient Education  2021 [employer].  Edema  Edema is an abnormal buildup of fluids in the body tissues and under the skin. Swelling of the legs, feet, and ankles is a common symptom that becomes more likely as you get older. Swelling is also common in looser tissues, like around the eyes. When the affected area is squeezed, the fluid may move out of that spot and leave a dent for a few moments. This dent is called pitting edema. There are many possible causes of edema. Eating too much salt (sodium) and being on your feet or sitting for a long time can cause edema in your legs, feet, and ankles. Hot weather may make edema worse. Common causes of edema include:  Heart failure.  Liver or kidney disease.  Weak leg blood vessels.  Cancer.  An injury.  Pregnancy.  Medicines.  Being obese.  Low protein levels in the blood. Edema is usually painless. Your skin may look swollen or shiny. Follow these instructions at home:  Keep the affected body part raised (elevated) above the level of your heart when you are sitting or lying down.  Do not sit still or stand for long periods of time.  Do not wear tight clothing. Do not wear garters on your upper legs.  Exercise your legs to get your circulation going. This helps to move the fluid back into your blood vessels, and it  may help the swelling go down.  Wear elastic bandages or support stockings to reduce swelling as told by your health care provider.  Eat a low-salt (low-sodium) diet to reduce fluid as told by your health care provider.  Depending on the cause of your swelling, you may need to limit how much fluid you drink (fluid restriction).  Take over-the-counter and prescription medicines only as told by your health care provider. Contact a health care provider if:  Your edema does not get better with treatment.  You have heart, liver, or kidney disease and have symptoms of edema.  You have sudden and unexplained weight gain. Get help right away if:  You develop shortness of breath or chest pain.  You cannot breathe when you lie down.  You develop pain, redness, or warmth in the swollen areas.  You have heart, liver, or kidney disease and suddenly get edema.  You have a fever and your symptoms suddenly get worse. Summary  Edema is an abnormal buildup of fluids in the body tissues  and under the skin.  Eating too much salt (sodium) and being on your feet or sitting for a long time can cause edema in your legs, feet, and ankles.  Keep the affected body part raised (elevated) above the level of your heart when you are sitting or lying down. This information is not intended to replace advice given to you by your health care provider. Make sure you discuss any questions you have with your health care provider. Document Revised: 02/05/2019 Document Reviewed: 10/21/2016 Elsevier Patient Education  2021 [employer].

## 2020-11-09 NOTE — Progress Notes (Signed)
Chief Complaint  Patient presents with  . Follow-up   F/u  1. HTN improved log with 130s to ~140 sbp and controlled today on losartan 25 mg qd  2. 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 Rayann Heman yearly she also has pvcs but will monitor with EP for now  Reviewed echo 04/2020 grade 1 dd ef ~55%  3. S/p GSB due to enbrel 04/2020 and was in ICU x 9 days and had respiratory arrest went to [facility] and still in PT 2x per week at times feeling sob with exertion  Reviewed CT chest 06/2020 saw [facility] pulm in [place]: 1. Mild cylindrical bronchiectasis in the right lower lobe. 2. Small area of ground-glass opacities in the right lung base, nonspecific, may be post infectious or post inflammatory. 3. Bilateral lower lobe dependent subpleural scarring versus atelectasis. 4. Mildly enlarged heart. 5. Aortic atherosclerosis.  Aortic Atherosclerosis (ICD10-I70.0).   Electronically Signed   By: Fidela Salisbury M.D.   On: 06/05/2020 12:41  Review of Systems  Constitutional: Negative for weight loss.  HENT: Positive for hearing loss.   Eyes: Negative for blurred vision.  Respiratory: Positive for shortness of breath.   Cardiovascular: Negative for chest pain.  Skin: Negative for rash.  Neurological: Negative for weakness.       Leg weakness improved walks with cane   Psychiatric/Behavioral: Negative for memory loss.   Past Medical History:  Diagnosis Date  . Ankle pain, right   . Atypical chest pain    a. 10/2018 MV: EF 57%. No ischemia/infarct.  . Coronary artery disease   . Diastolic dysfunction    a. 04/2018 Echo: EF 55-60%, no rwma, Gr1 DD. Triv AI, mild MR. Mod dil LA. Nl RV fxn. PASP nl.  . GERD (gastroesophageal reflux disease)   . Headache    "dull one sometimes daily, at least weekly in last couple months" (07/22/2018)  . Hyperlipidemia   . Hypertension   . 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 Joint pain in fingers of right hand   . Lichen sclerosus   .  Osteopenia   . Pneumonia 2012? X 1  . 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 Psoriasis   . Psoriatic arthritis ([place])    " dx'd the 1st of this year, 2019" (07/22/2018)  . Psoriatic arthritis ([place])   . Rosacea   . Seborrheic keratosis   . Second degree heart block    a. 07/2018 s/p SJM Assurity MRI model [id number] (Ser# [id number]).  . Sleep apnea    "using nasal pilllows since ~ 12/2017" (07/22/2018)  . TMJ (dislocation of temporomandibular joint)    Past Surgical History:  Procedure Laterality Date  . APPENDECTOMY    . BREAST CYST ASPIRATION Bilateral   . BREAST CYST EXCISION Right 1978   benign  . COLONOSCOPY    . COLONOSCOPY WITH PROPOFOL N/A 07/10/2019   Procedure: COLONOSCOPY WITH PROPOFOL;  Surgeon: Lollie Sails, MD;  Location: [facility] ENDOSCOPY;  Service: Endoscopy;  Laterality: N/A;  . ESOPHAGOGASTRODUODENOSCOPY (EGD) WITH PROPOFOL N/A 07/10/2019   Procedure: ESOPHAGOGASTRODUODENOSCOPY (EGD) WITH PROPOFOL;  Surgeon: Lollie Sails, MD;  Location: [facility] ENDOSCOPY;  Service: Endoscopy;  Laterality: N/A;  . HYSTEROSCOPY WITH D & C N/A 11/05/2018   Procedure: DILATATION AND CURETTAGE /HYSTEROSCOPY;  Surgeon: Homero Fellers, MD;  Location: [facility] ORS;  Service: Gynecology;  Laterality: N/A;  . INSERT / REPLACE / REMOVE PACEMAKER    . PACEMAKER IMPLANT N/A 07/23/2018  SJM Assurity [id number] implanted by Dr Rayann Heman for mobitz II second degree AV block  . UTERINE POLYPS REMOVAL     Family History  Problem Relation Age of Onset  . Stroke Mother   . Atrial fibrillation Mother   . Breast cancer Mother   . Stroke Father   . Breast cancer Maternal Aunt   . Breast cancer Paternal Aunt    Social History   Socioeconomic History  . Marital status: Single    Spouse name: Not on file  . Number of children: Not on file  . Years of education: Not on file  . Highest education level: Not on file  Occupational History  . Not on file  Tobacco Use  . Smoking status: Never  Smoker  . Smokeless tobacco: Never Used  . Tobacco comment: 07/22/2018 "smoked some in college; 1960s;  nothing since"  Vaping Use  . Vaping Use: Never used  Substance and Sexual Activity  . Alcohol use: Yes    Comment: rarely  . Drug use: Never  . Sexual activity: Not Currently    Birth control/protection: Post-menopausal  Other Topics Concern  . Not on file  Social History Narrative   Right Handed   Lives in an apartment. 5th floor but has elevator    Social Determinants of Health   Financial Resource Strain: Low Risk   . Difficulty of Paying Living Expenses: Not hard at all  Food Insecurity: No Food Insecurity  . Worried About Charity fundraiser in the Last Year: Never true  . Ran Out of Food in the Last Year: Never true  Transportation Needs: No Transportation Needs  . Lack of Transportation (Medical): No  . Lack of Transportation (Non-Medical): No  Physical Activity: Sufficiently Active  . Days of Exercise per Week: 4 days  . Minutes of Exercise per Session: 60 min  Stress: No Stress Concern Present  . Feeling of Stress : Not at all  Social Connections: Not on file  Intimate Partner Violence: Not At Risk  . Fear of Current or Ex-Partner: No  . Emotionally Abused: No  . Physically Abused: No  . Sexually Abused: No   Current Meds  Medication Sig  . acetaminophen (TYLENOL) 325 MG tablet Take 325-650 mg by mouth every 6 (six) hours as needed for mild pain or headache.   . hydrocerin (EUCERIN) CREA Apply 1 application topically 2 (two) times daily. To bilateral feet.  [name] ketoconazole (NIZORAL) 2 % cream Apply 1 application topically daily.  . tacrolimus (PROTOPIC) 0.1 % ointment Apply topically 2 (two) times daily as needed (for psoriasis).   . Vitamin D, Ergocalciferol, 50 MCG (2000 UT) CAPS Take by mouth.  . [DISCONTINUED] losartan (COZAAR) 25 MG tablet Take 1 tablet (25 mg total) by mouth daily.   Allergies  Allergen Reactions  . Etanercept     Guillain Baree  .  Nickel    Recent Results (from the past 2160 hour(s))  Basic Metabolic Panel (BMET)     Status: None   Collection Time: 08/17/20 10:48 AM  Result Value Ref Range   Sodium 137 135 - 145 mEq/L   Potassium 4.2 3.5 - 5.1 mEq/L   Chloride 105 96 - 112 mEq/L   CO2 26 19 - 32 mEq/L   Glucose, Bld 82 70 - 99 mg/dL   BUN 14 6 - 23 mg/dL   Creatinine, Ser 0.50 0.40 - 1.20 mg/dL   GFR 88.35 >60.00 mL/min    Comment: Calculated using the  CKD-EPI Creatinine Equation (2021)   Calcium 8.9 8.4 - 10.5 mg/dL  CUP PACEART REMOTE DEVICE CHECK     Status: None   Collection Time: 10/13/20  2:00 AM  Result Value Ref Range   Date Time Interrogation Session [id number]    Pulse Generator Manufacturer [employer]    Pulse Gen Model 2272 Assurity MRI    Pulse Gen Serial Number [id number]    Clinic Name [facility]    Implantable Pulse Generator Type Implantable Pulse Generator    Implantable Pulse Generator Implant Date 62703500    Implantable Lead Manufacturer [facility]    Implantable Lead Model LPA1200M Tendril MRI    Implantable Lead Serial Number [id number]    Implantable Lead Implant Date 71696789    Implantable Lead Location Detail 1 UNKNOWN    Implantable Lead Location [id number]    Implantable Lead Manufacturer [facility]    Implantable Lead Model LPA1200M Tendril MRI    Implantable Lead Serial Number [id number]    Implantable Lead Implant Date 25852778    Implantable Lead Location Detail 1 UNKNOWN    Implantable Lead Location [id number]    Lead Channel Setting Sensing Sensitivity 2.0 mV   Lead Channel Setting Sensing Adaptation Mode Fixed Pacing    Lead Channel Setting Pacing Amplitude 2.0 V   Lead Channel Setting Pacing Pulse Width 0.5 ms   Lead Channel Setting Pacing Amplitude 1.0 V   Lead Channel Status NULL    Lead Channel Impedance Value 560 ohm   Lead Channel Sensing Intrinsic Amplitude 4.7 mV   Lead Channel Pacing Threshold Amplitude 0.5 V   Lead Channel Pacing Threshold Pulse Width 0.5 ms   Lead Channel  Status NULL    Lead Channel Impedance Value 730 ohm   Lead Channel Sensing Intrinsic Amplitude 12.0 mV   Lead Channel Pacing Threshold Amplitude 0.75 V   Lead Channel Pacing Threshold Pulse Width 0.5 ms   Battery Status MOS    Battery Remaining Longevity 133 mo   Battery Remaining Percentage 95.5 %   Battery Voltage 3.01 V   Brady Statistic RA Percent Paced 1.9 %   Brady Statistic RV Percent Paced 1.0 %   Brady Statistic AP VP Percent 1.0 %   Brady Statistic AS VP Percent 1.0 %   Brady Statistic AP VS Percent 6.3 %   Brady Statistic AS VS Percent 89.0 %  Ovarian Malignancy Risk-ROMA     Status: Abnormal   Collection Time: 10/27/20  9:24 AM  Result Value Ref Range   Cancer Antigen (CA) 125 6.3 0.0 - 38.1 U/mL    Comment: [employer] Electrochemiluminescence Immunoassay (ECLIA) Values obtained with different assay methods or kits cannot be used interchangeably.  Results cannot be interpreted as absolute evidence of the presence or absence of malignant disease.    HE4 77.0 0.0 - 96.9 pmol/L    Comment: [employer] Electrochemiluminescence Immunoassay (ECLIA) Values obtained with different assay methods or kits cannot be used interchangeably.  Results cannot be interpreted as absolute evidence of the presence or absence of malignant disease.    Premenopausal ROMA 1.76 (H) See below   Postmenopausal ROMA 0.98 See below   Comment Comment     Comment: The Risk for Ovarian Malignancy Algorithm (ROMA) test is intended to aid in assessing whether a premenopausal or postmenopausal woman who presents with an ovarian adnexal mass is at high or low likelihood of finding malignancy on surgery. ROMA is indicated for women who meet the following criteria: over age 14; ovarian  adnexal mass present for which surgery is planned, and not yet referred to an oncologist. ROMA must be interpreted in conjunction with an independent clinical and radiological assessment. The test is  not intended as a screening or stand-alone diagnostic assay. HE4 and CA125 values obtained with different assay methods or kits cannot be used interchangeably.   Premenopausal Interp: HIGH     Status: None   Collection Time: 10/27/20  9:24 AM  Result Value Ref Range   Premenopausal Interp: HIGH Comment     Comment: If the patient is premenopausal, then the premenopausal ROMA score of greater than or equal to 1.14 is consistent with a high likelihood of finding a malignancy on surgery.   Postmenopausal Interp: LOW     Status: None   Collection Time: 10/27/20  9:24 AM  Result Value Ref Range   Postmenopausal Interp: LOW Comment     Comment: If the patient is postmenopausal, then the postmenopausal ROMA score of less than 2.99 is consistent with a low likelihood of finding a malignancy on surgery.   Surgical pathology     Status: None   Collection Time: 11/01/20  4:08 PM  Result Value Ref Range   SURGICAL PATHOLOGY      SURGICAL PATHOLOGY CASE: [id number] PATIENT: Arsenia England Surgical Pathology Report     Clinical History: Thickened endometrium, PMB (nt)     FINAL MICROSCOPIC DIAGNOSIS:  A. ENDOMETRIUM, BIOPSY: - Benign inactive endometrium - Negative for hyperplasia or malignancy     GROSS DESCRIPTION:  Received in formalin are 1.5 x 1.3 x 0.3 cm of bloody mucus.  The specimen is submitted in toto.  [facility] 11/02/2020)   Final Diagnosis performed by Jaquita Folds, MD.   Electronically signed 11/03/2020 Technical component performed at [facility]. [facility], [place] [address] [place], [place] [address].  Professional component performed at [facility], [place] [address] [place], [place] [address].  Immunohistochemistry Technical component (if applicable) was performed at [facility]. [address] [place], [place], [place] [address].   IMMUNOHISTOCHEMISTRY DISCLAIMER (if applicable): Some of these  immunohistochemical sta ins may have been developed and the performance characteristics determine by [facility]. Some may not have been cleared or approved by the U.S. Food and Drug Administration. The FDA has determined that such clearance or approval is not necessary. This test is used for clinical purposes. It should not be regarded as investigational or for research. This laboratory is certified under the [place] (CLIA-88) as qualified to perform high complexity clinical laboratory testing.  The controls stained appropriately.    Objective  Body mass index is 27.96 kg/m. Wt Readings from Last 3 Encounters:  11/09/20 173 lb 3.2 oz (78.6 kg)  11/01/20 176 lb (79.8 kg)  10/22/20 176 lb (79.8 kg)   Temp Readings from Last 3 Encounters:  11/09/20 97.6 F (36.4 C) (Oral)  09/22/20 97.9 F (36.6 C)  09/08/20 (!) 97.1 F (36.2 C)   BP Readings from Last 3 Encounters:  11/09/20 126/68  11/01/20 126/70  10/22/20 (!) 142/86   Pulse Readings from Last 3 Encounters:  11/09/20 78  10/22/20 78  09/22/20 68    Physical Exam Vitals and nursing note reviewed.  Constitutional:      Appearance: Normal appearance. She is well-developed and well-groomed.  HENT:     Head: Normocephalic and atraumatic.     Right Ear: There is impacted cerumen.     Left Ear: There  is impacted cerumen.  Eyes:     Conjunctiva/sclera: Conjunctivae normal.     Pupils: Pupils are equal, round, and reactive to light.  Cardiovascular:     Rate and Rhythm: Normal rate and regular rhythm.     Heart sounds: Normal heart sounds.     Comments: +pvcs  Pulmonary:     Effort: Pulmonary effort is normal.     Breath sounds: Normal breath sounds.  Musculoskeletal:     Right lower leg: 1+ Pitting Edema present.     Left lower leg: 1+ Pitting Edema present.  Skin:    General: Skin is warm and dry.  Neurological:     General: No focal deficit present.      Mental Status: She is alert and oriented to person, place, and time. Mental status is at baseline.     Gait: Gait abnormal.     Comments: Walks with cane  Psychiatric:        Attention and Perception: Attention and perception normal.        Mood and Affect: Mood and affect normal.        Speech: Speech normal.        Behavior: Behavior normal. Behavior is cooperative.        Thought Content: Thought content normal.        Cognition and Memory: Cognition and memory normal.        Judgment: Judgment normal.     Assessment  Plan  Primary hypertension controlled - Plan: losartan (COZAAR) 25 MG tablet, Comprehensive metabolic panel, Lipid panel, CBC with Differential/Platelet  Scarring of lung Aortic atherosclerosis (HCC) Bronchiectasis without complication (HCC) -consider albuterol inhaler pt will hold for now and use spirometer and let us know if inhaler wanted   Vitamin D deficiency - Plan: Vitamin D (25 hydroxy)   HM Flu shot utd  3/3 pfizer  utd pna 23 and prevnar Tdap utd  shingrix 1/2 had 2nd dose total care  Colonoscopy 07/10/2019 mammo 06/18/20 negative  Pap out of age window  Has psoriasis with joint pain and on Enbrel had GBS Dermatologist Dr. Kellie Moor seen 1-2 months from today 08/2020  Provider: Dr. Olivia Mackie McLean-Scocuzza-Internal Medicine

## 2020-11-11 DIAGNOSIS — R2689 Other abnormalities of gait and mobility: Secondary | ICD-10-CM | POA: Diagnosis not present

## 2020-11-11 DIAGNOSIS — R279 Unspecified lack of coordination: Secondary | ICD-10-CM | POA: Diagnosis not present

## 2020-11-11 DIAGNOSIS — G610 Guillain-Barre syndrome: Secondary | ICD-10-CM | POA: Diagnosis not present

## 2020-11-11 DIAGNOSIS — M6281 Muscle weakness (generalized): Secondary | ICD-10-CM | POA: Diagnosis not present

## 2020-11-15 DIAGNOSIS — R279 Unspecified lack of coordination: Secondary | ICD-10-CM | POA: Diagnosis not present

## 2020-11-15 DIAGNOSIS — G610 Guillain-Barre syndrome: Secondary | ICD-10-CM | POA: Diagnosis not present

## 2020-11-15 DIAGNOSIS — M6281 Muscle weakness (generalized): Secondary | ICD-10-CM | POA: Diagnosis not present

## 2020-11-15 DIAGNOSIS — R2689 Other abnormalities of gait and mobility: Secondary | ICD-10-CM | POA: Diagnosis not present

## 2020-11-15 DIAGNOSIS — H2513 Age-related nuclear cataract, bilateral: Secondary | ICD-10-CM | POA: Diagnosis not present

## 2020-11-16 ENCOUNTER — Telehealth: Payer: Self-pay

## 2020-11-17 ENCOUNTER — Inpatient Hospital Stay: Payer: Medicare Other | Attending: Obstetrics and Gynecology | Admitting: Obstetrics and Gynecology

## 2020-11-17 ENCOUNTER — Other Ambulatory Visit: Payer: Self-pay

## 2020-11-17 VITALS — BP 144/64 | HR 73 | Temp 97.8°F | Resp 20 | Wt 175.3 lb

## 2020-11-17 DIAGNOSIS — I110 Hypertensive heart disease with heart failure: Secondary | ICD-10-CM | POA: Diagnosis not present

## 2020-11-17 DIAGNOSIS — N950 Postmenopausal bleeding: Secondary | ICD-10-CM | POA: Diagnosis not present

## 2020-11-17 DIAGNOSIS — K219 Gastro-esophageal reflux disease without esophagitis: Secondary | ICD-10-CM | POA: Diagnosis not present

## 2020-11-17 DIAGNOSIS — L4050 Arthropathic psoriasis, unspecified: Secondary | ICD-10-CM | POA: Diagnosis not present

## 2020-11-17 DIAGNOSIS — Z950 Presence of cardiac pacemaker: Secondary | ICD-10-CM | POA: Insufficient documentation

## 2020-11-17 DIAGNOSIS — E785 Hyperlipidemia, unspecified: Secondary | ICD-10-CM | POA: Diagnosis not present

## 2020-11-17 DIAGNOSIS — I5032 Chronic diastolic (congestive) heart failure: Secondary | ICD-10-CM | POA: Insufficient documentation

## 2020-11-17 DIAGNOSIS — N904 Leukoplakia of vulva: Secondary | ICD-10-CM | POA: Insufficient documentation

## 2020-11-17 DIAGNOSIS — N83202 Unspecified ovarian cyst, left side: Secondary | ICD-10-CM | POA: Diagnosis not present

## 2020-11-17 NOTE — Progress Notes (Signed)
Gynecologic Oncology Visit   Chief Complaint: post menopausal bleeding & ovarian cyst  Referring Provider: Dr. Gilman Schmidt  Chief Complaint: ovarian cyst  Subjective:  Alison Huffman is a 80 y.o. female who is seen in consultation from Dr. Gilman Schmidt for post menopausal bleeding   Returns again for recommendations regarding management of small ovarian cysts and thickened endometrium s/p negative endometrial biopsy.  Today, she says that she hasn't had any additional episodes of bleeding.   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 She was intubated in the ICU and then discharged to rehab. She is gradually improving, but still using a walker.    She does not desire surgery.    Also uses Temovate for lichen sclerosis.  No symptoms now.   [place] 10/26/2020:  Endometrium Thickness: 5 mm. Probable polyp at upper uterine segment endometrial canal 6 x 8 x 6 mm, previously 7 mm diameter. No endometrial fluid.   Left ovary Measurements: 3.1 x 2.1 x 2.0 cm = volume: 6.9 mL. Complex hypoechoic nodule within LEFT ovary 2.5 x 1.6 x 2.0 cm in size, containing fairly homogeneous low level internal echogenicity and a small eccentric brightly echogenic nonshadowing focus of. Ovarian neoplasm is not excluded. Lesion has not significantly changed in size since the previous exam. On color Doppler imaging, scattered areas of internal flow are identified. Lesion is suspicious for an ovarian neoplasm.  10/27/20 CA125 6.3, HE4 77 ROMA 0.98 normal   ENDOMETRIUM, BIOPSY:  - Benign inactive endometrium  - Negative for hyperplasia or malignancy   Gyn History She initially presented to Dr. Gilman Schmidt which occurred on 09/06/2018, described as bright red and no bleeding since that time. Menopause at age 37. Hx of lichen sclerosis which is managed with once weekly clobetasol, managed by dermatology. Normal colonoscopy ~ 3 years ago.    Ultrasound- 09/30/2018 Uterus- Anteverted, measures 6.2 x 3.8 x 2.6cm. Homogenous w/o evidence of focal masses Endometrium/endocervix is thickened and heterogeneous measuring 8.6 mm and endocervical measuring 13.2mm.  Right Ovary measures 1.9 x 1.4 x 1.1cm. It is normal in appearance. Left Ovary measures 2.3 x 2.2 x 1.8cm. Tissue appears abnormal. Questionable hypoechoic lesion 1.9cm. Does not appear cystic.  Survey of the adnexa demonstrates no adnexal masses. There is no free fluid in the cul de sac. Impression: 1. Thickened and heterogeneous endometrium. 2. Abnormal appearing left ovary.  CA 125 8.5 (normal) HE4  70.9 (normal) Postmenopausal ROMA 1.10 (normal)  D&C-  DIAGNOSIS:  A. ENDOCERVIX; CURETTAGE:  - SCANT MINUTE FRAGMENTS OF BENIGN ENDOCERVICAL TISSUE AND SQUAMOUS EPITHELIUM.   B. ENDOMETRIUM; POLYPECTOMY AND SAMPLING:  - ENDOMETRIAL POLYP FRAGMENTS.  - ENDOCERVICAL POLYP FRAGMENTS.  - SCANT MINUTE FRAGMENTS OF ATROPHIC ENDOMETRIUM, ENDOCERVICAL TISSUE, AND SQUAMOUS EPITHELIUM.  - NEGATIVE FOR ATYPIA AND MALIGNANCY.   Ultrasound- 12/11/2018 Left ovary- Measurements: 2.2 x 1.4 x 1.4 cm = volume: 2.2 mL. 0.9 x 0.6 x 0.6 cm shadowing echogenic lesion seen adjacent to the left ovary, indeterminate.  1. Question approximate 1.8 cm hypoechoic complex left ovarian cystic lesion, indeterminate. Short interval follow-up ultrasound in approximately 6 months suggested for further evaluation. 2. 5 mm echogenic focus involving the mid endometrium, with additional 1.9 cm hypoechoic lesion at the lower uterine segment/cervix. Findings are indeterminate, and could reflect postoperative changes and/or polyps as seen on recent dilatation and curettage. Correlation with previous procedural findings recommended. 3. Otherwise normal uterus. 4. Nonvisualization of the right ovary.  07/02/2019: Endometrium Thickness: 5 mm. Hypoechoic focus centrally within the endometrial canal with minimal  surrounding hyperechogenicity, 7 x 7 x 9 mm question polyp. Additionally, a more heterogeneous area of cystic and solid-appearing tissue is identified at the lower uterine segment endometrial/endocervical canal, 17 x 11 x 16 mm, grossly unchanged, question polyp. Left ovary Measurements: 2.1 x 1.6 x 1.7 cm = volume: 3.0 mL. Fairly homogeneous hypoechoic nodule within LEFT lobe read, somewhat macrolobulated, suspect complex cystic/hemorrhagic nodule, with a potential small mural nodule 9 x 3 x 9 mm in size. No definite blood flow within the mural nodule color Doppler imaging. 12/11/2019: Endometrium Thickness: 4 mm. There is a focal masslike area of thickening involving the endometrium measuring 7 mm.  Left ovary Measurements: 2.5 x 1.8 x 1.8 cm = volume: 4.2 mL. Within the [place] there is a homogeneous hypoechoic mass which measures 2.3 x 1.6 x 1.9 cm, previously 2.1 x 1.6 x 1.7 cm. 03/04/2020: Endometrium Thickness: 4 mm. Focal hyperechoic nodule identified within mid segment endometrial canal to the LEFT measuring 7 x 5 x 5 mm suspicious for polyp. No endometrial fluid.  Left ovary Measurements: 3.0 x 1.4 x 1.7 cm = volume: 3.7 mL. Hypoechoic complicated cystic lesion of the LEFT ovary 2.5 x 1.8 x 2.0 cm (previously 2.3 x 1.6 x 1.9 cm), containing a small septation which appears to have internal blood flow on color Doppler imaging. Small mural nodule as well. Findings represents a complicated cystic lesion and malignancy is not excluded with this appearance.    Problem List: Patient Active Problem List   Diagnosis Date Noted  . Scarring of lung 11/09/2020  . Aortic atherosclerosis ([place]) 11/09/2020  . Impaired functional mobility, balance, gait, and endurance 09/22/2020  . Seborrheic dermatitis 08/06/2020  . Ringworm of body 08/06/2020  . Rash 08/06/2020  . Bilateral leg edema 06/22/2020  . HCAP (healthcare-associated pneumonia) 06/22/2020  . Gallbladder polyp 06/22/2020  .  Abnormal findings on diagnostic imaging of lung 06/14/2020  . 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 Physical deconditioning 06/14/2020  . Bronchiectasis without complication ([place]) 04/54/0981  . Abnormal gait due to muscle weakness 05/28/2020  . Acute blood loss anemia   . Malnutrition of moderate degree 04/19/2020  . Guillain Barr syndrome ([place]) 04/18/2020  . Chronic diastolic congestive heart failure ([place])   . Coronary artery disease involving native coronary artery of native heart without angina pectoris   . Respiratory failure ([place])   . Inversion of right nipple 10/01/2019  . Cyst of left ovary 01/15/2019  . History of gastroesophageal reflux (GERD) 12/12/2018  . Postmenopausal bleeding 09/30/2018  . Thickened endometrium 09/30/2018  . Bilateral shoulder pain 09/20/2018  . Second degree AV block, Mobitz type II 07/22/2018  . Greater trochanteric bursitis of both hips 12/18/2017  . Psoriasis 09/27/2017  . Prediabetes 09/19/2017  . Stiffness of right hand joint 08/28/2017  . 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 Allergic rhinitis 06/14/2017  . History of carotid stenosis 06/14/2017  . Psoriatic arthritis ([place]) 06/14/2017  . Lichen sclerosus   . GERD (gastroesophageal reflux disease)   . Hypertension 03/02/2017  . Hyperlipidemia 03/02/2017  . OSA (obstructive sleep apnea) 03/02/2017  . Osteoporosis 03/02/2017    Past Medical History: Past Medical History:  Diagnosis Date  . Ankle pain, right   . Atypical chest pain    a. 10/2018 MV: EF 57%. No ischemia/infarct.  . Coronary artery disease   . Diastolic dysfunction    a. 04/2018 Echo: EF 55-60%, no rwma,  Gr1 DD. Triv AI, mild MR. Mod dil LA. Nl RV fxn. PASP nl.  . GERD (gastroesophageal reflux disease)   . Headache    "dull one sometimes daily, at least weekly in last couple months" (07/22/2018)  . Hyperlipidemia   . Hypertension   . 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 Joint pain in fingers of right hand   . Lichen sclerosus   .  Osteopenia   . Pneumonia 2012? X 1  . 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 Psoriasis   . Psoriatic arthritis ([place])    " dx'd the 1st of this year, 2019" (07/22/2018)  . Psoriatic arthritis ([place])   . Rosacea   . Seborrheic keratosis   . Second degree heart block    a. 07/2018 s/p SJM Assurity MRI model PM2271 (Ser# [id number]).  . Sleep apnea    "using nasal pilllows since ~ 12/2017" (07/22/2018)  . TMJ (dislocation of temporomandibular joint)     Past Surgical History: Past Surgical History:  Procedure Laterality Date  . APPENDECTOMY    . BREAST CYST ASPIRATION Bilateral   . BREAST CYST EXCISION Right 1978   benign  . COLONOSCOPY    . COLONOSCOPY WITH PROPOFOL N/A 07/10/2019   Procedure: COLONOSCOPY WITH PROPOFOL;  Surgeon: Lollie Sails, MD;  [facility] END[facility]COPY;  Service: Endoscopy;  Laterality: N/A;  . ESOPHAGOGASTRODUODENOSCOPY (EGD) WITH PROPOFOL N/A 07/10/2019   Procedure: ESOPHAGOGASTRODUODENOSCOPY (EGD) WITH PROPOFOL;  Surgeon: Lollie Sails, MD;  Location: [facility] ENDOSCOPY;  Service: Endoscopy;  Laterality: N/A;  . HYSTEROSCOPY WITH D & C N/A 11/05/2018   Procedure: DILATATION AND CURETTAGE /HYSTEROSCOPY;  Surgeon: Homero Fellers, MD;  Location: [facility] ORS;  Service: Gynecology;  Laterality: N/A;  . INSERT / REPLACE / REMOVE PACEMAKER    . 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 UTERINE POLYPS REMOVAL      Past Gynecologic History:  Post menopausal.    OB History:  OB History  Gravida Para Term Preterm AB Living  0 0 0 0 0 0  SAB IAB Ectopic Multiple Live Births  0 0 0 0 0    Family History: Family History  Problem Relation Age of Onset  . Stroke Mother   . Atrial fibrillation Mother   . Breast cancer Mother   . Stroke Father   . Breast cancer Maternal Aunt   . Breast cancer Paternal Aunt     Social History: Social History   Socioeconomic  History  . Marital status: Single    Spouse name: Not on file  . Number of children: Not on file  . Years of education: Not on file  . Highest education level: Not on file  Occupational History  . Not on file  Tobacco Use  . Smoking status: Never Smoker  . Smokeless tobacco: Never Used  . Tobacco comment: 07/22/2018 "smoked some in college; 1960s;  nothing since"  Vaping Use  . Vaping Use: Never used  Substance and Sexual Activity  . Alcohol use: Yes    Comment: rarely  . Drug use: Never  . Sexual activity: Not Currently    Birth control/protection: Post-menopausal  Other Topics Concern  . Not on file  Social History Narrative   Right Handed   Lives in an apartment. 5th floor but has elevator    Social Determinants of Health   Financial Resource Strain: Low Risk   . Difficulty  of Paying Living Expenses: Not hard at all  Food Insecurity: No Food Insecurity  . Worried About Charity fundraiser in the Last Year: Never true  . Ran Out of Food in the Last Year: Never true  Transportation Needs: No Transportation Needs  . Lack of Transportation (Medical): No  . Lack of Transportation (Non-Medical): No  Physical Activity: Sufficiently Active  . Days of Exercise per Week: 4 days  . Minutes of Exercise per Session: 60 min  Stress: No Stress Concern Present  . Feeling of Stress : Not at all  Social Connections: Not on file  Intimate Partner Violence: Not At Risk  . Fear of Current or Ex-Partner: No  . Emotionally Abused: No  . Physically Abused: No  . Sexually Abused: No    Allergies: Allergies  Allergen Reactions  . Etanercept     Guillain Baree  . Nickel     Current Medications: Current Outpatient Medications  Medication Sig Dispense Refill  . acetaminophen (TYLENOL) 325 MG tablet Take 325-650 mg by mouth every 6 (six) hours as needed for mild pain or headache.     . hydrocerin (EUCERIN) CREA Apply 1 application topically 2 (two) times daily. To bilateral feet.  0   . ketoconazole (NIZORAL) 2 % cream Apply 1 application topically daily. 15 g 0  . losartan (COZAAR) 25 MG tablet Take 1 tablet (25 mg total) by mouth daily. 90 tablet 3  . tacrolimus (PROTOPIC) 0.1 % ointment Apply topically 2 (two) times daily as needed (for psoriasis).     . Vitamin D, Ergocalciferol, 50 MCG (2000 UT) CAPS Take by mouth.    . polyethylene glycol (MIRALAX / GLYCOLAX) 17 g packet Take 17 g by mouth daily as needed for severe constipation. (Patient not taking: No sig reported) 14 each 0   No current facility-administered medications for this visit.    Review of Systems General: negative for fevers, chills, changes in sleep, changes in weight or appetite Skin: negative for changes in color, texture, moles or lesions Eyes: negative for changes in vision, pain, diplopia HEENT: negative for change in hearing, pain, discharge, tinnitus, vertigo, voice changes, sore throat, neck masses Breasts: negative for breast lumps Pulmonary: negative for dyspnea, orthopnea, productive cough Cardiac: negative for palpitations, syncope, pain, discomfort, pressure Gastrointestinal: negative for dysphagia, nausea, vomiting, jaundice, pain, constipation, diarrhea, hematemesis, hematochezia Genitourinary/Sexual: negative for dysuria, discharge, hesitancy, nocturia, retention, stones, infections, STD's, incontinence Ob/Gyn: negative for irregular bleeding, pain Musculoskeletal: negative for pain, stiffness, swelling, range of motion limitation Hematology: negative for easy bruising, bleeding Neurologic/Psych: negative for headaches, seizures, paralysis, weakness, tremor, change in gait, change in sensation, mood swings, depression, anxiety, change in memory   Objective:  Physical Examination:  BP (!) 144/64   Pulse 73   Temp 97.8 F (36.6 C)   Resp 20   Wt 175 lb 4.8 oz (79.5 kg)   SpO2 100%   BMI 28.29 kg/m     ECOG Performance Status: 0 - Asymptomatic   General: well appearing  female in no apparent distress HEENT: Atraumatic and normocephalic Neuro:  Alert and oriented Lungs: clear to A and P Heart: Regular with no gallops or murmers Abdomen: soft, no masses Extr: no acute changes Lymph nodes: normal  Pelvic EGBUS: atrophic and somewhat white Vagina: atropic Cervix: atrophic, no lesions Uterus: normal Bimanual/RV: no masses or tenderness    Assessment:  SHADAE REINO is a 80 y.o. female diagnosed with asymptomatic small ovarian cyst and normal tumor  markers and ROMA. Most likely benign; less likely to represent neoplastic process, especially with lack of significant progression for past few years.  Also had an episode of PMB in 2019 and slightly thickened endometrium and EMB just showed fragments of polyp.  No more PMB, but repeat EMB recently also negative.   Medical co-morbidities complicating care: Atypical chest pain and diastolic dysfunction -  01/2777 Echo: EF 55-60%, no rwma, Gr1 DD. Triv AI, mild MR. Mod dil LA. Nl RV fxn. PASP, Pace maker in place.  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  No symptoms presently.  Plan:   Problem List Items Addressed This Visit      Endocrine   Cyst of left ovary - Primary     We discussed options for management and agree with Dr. Gilman Schmidt that continued expectant management is appropriate in view of reassuring imaging, tumor markers and endometrial biopsy.  She will continue to follow up with Dr. Gilman Schmidt and we can see her back if needed in the future.    I discussed the assessment and plan with the patient. The patient was provided an opportunity to ask questions and all were answered.    The patient was advised to call back or seek an in-person evaluation if the symptoms worsen or if the condition fails to improve as anticipated.  Mellody Drown, MD   CC:  Homero Fellers, MD [address] [place], [place] [address] [phone]

## 2020-11-18 DIAGNOSIS — M6281 Muscle weakness (generalized): Secondary | ICD-10-CM | POA: Diagnosis not present

## 2020-11-18 DIAGNOSIS — G610 Guillain-Barre syndrome: Secondary | ICD-10-CM | POA: Diagnosis not present

## 2020-11-18 DIAGNOSIS — R2689 Other abnormalities of gait and mobility: Secondary | ICD-10-CM | POA: Diagnosis not present

## 2020-11-18 DIAGNOSIS — R279 Unspecified lack of coordination: Secondary | ICD-10-CM | POA: Diagnosis not present

## 2020-11-18 NOTE — Telephone Encounter (Signed)
The patient's BPs are generally adequately controlled.  She should continue to monitor.  Please make sure she has follow-up scheduled with me.

## 2020-11-18 NOTE — Telephone Encounter (Signed)
Lvm for the patient to call back about BP.  Katja Blue,cma

## 2020-11-19 NOTE — Telephone Encounter (Signed)
Patient returned office phone call about her BP.

## 2020-11-22 DIAGNOSIS — G610 Guillain-Barre syndrome: Secondary | ICD-10-CM | POA: Diagnosis not present

## 2020-11-22 DIAGNOSIS — R2689 Other abnormalities of gait and mobility: Secondary | ICD-10-CM | POA: Diagnosis not present

## 2020-11-22 DIAGNOSIS — M6281 Muscle weakness (generalized): Secondary | ICD-10-CM | POA: Diagnosis not present

## 2020-11-22 DIAGNOSIS — R279 Unspecified lack of coordination: Secondary | ICD-10-CM | POA: Diagnosis not present

## 2020-11-22 NOTE — Telephone Encounter (Signed)
Patient called back and I informed her that the provide stated her BP is controlled and to continue to monitor.  She has a f/up with the provider scheduled.  Enes Rokosz,cma

## 2020-11-22 NOTE — Telephone Encounter (Signed)
Returned call and LVM for the patient to call back.  Kyvon Hu,cma

## 2020-11-25 DIAGNOSIS — R279 Unspecified lack of coordination: Secondary | ICD-10-CM | POA: Diagnosis not present

## 2020-11-25 DIAGNOSIS — R2689 Other abnormalities of gait and mobility: Secondary | ICD-10-CM | POA: Diagnosis not present

## 2020-11-25 DIAGNOSIS — G610 Guillain-Barre syndrome: Secondary | ICD-10-CM | POA: Diagnosis not present

## 2020-11-25 DIAGNOSIS — M6281 Muscle weakness (generalized): Secondary | ICD-10-CM | POA: Diagnosis not present

## 2020-11-29 ENCOUNTER — Other Ambulatory Visit: Payer: Self-pay

## 2020-11-29 ENCOUNTER — Encounter: Payer: Self-pay | Admitting: Obstetrics and Gynecology

## 2020-11-29 ENCOUNTER — Ambulatory Visit (INDEPENDENT_AMBULATORY_CARE_PROVIDER_SITE_OTHER): Payer: Medicare Other | Admitting: Obstetrics and Gynecology

## 2020-11-29 VITALS — BP 120/70 | Ht 66.0 in | Wt 176.4 lb

## 2020-11-29 DIAGNOSIS — N83299 Other ovarian cyst, unspecified side: Secondary | ICD-10-CM | POA: Diagnosis not present

## 2020-11-29 DIAGNOSIS — N83202 Unspecified ovarian cyst, left side: Secondary | ICD-10-CM | POA: Diagnosis not present

## 2020-11-29 NOTE — Progress Notes (Signed)
Patient ID: Alison Huffman, female   DOB: September 11, 1941, 80 y.o.   MRN: [id number]  Reason for Consult: Gynecologic Exam   Referred by Leone Haven, MD  Subjective:     HPI:  Alison Huffman is a 80 y.o. female . She is following up after seeing gynecological oncology. She has no new complaints.  Past Medical History:  Diagnosis Date  . Ankle pain, right   . Atypical chest pain    a. 10/2018 MV: EF 57%. No ischemia/infarct.  . Coronary artery disease   . Diastolic dysfunction    a. 04/2018 Echo: EF 55-60%, no rwma, Gr1 DD. Triv AI, mild MR. Mod dil LA. Nl RV fxn. PASP nl.  . GERD (gastroesophageal reflux disease)   . Headache    "dull one sometimes daily, at least weekly in last couple months" (07/22/2018)  . Hyperlipidemia   . Hypertension   . 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 Joint pain in fingers of right hand   . Lichen sclerosus   . Osteopenia   . Pneumonia 2012? X 1  . 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 Psoriasis   . Psoriatic arthritis ([place])    " dx'd the 1st of this year, 2019" (07/22/2018)  . Psoriatic arthritis ([place])   . Rosacea   . Seborrheic keratosis   . Second degree heart block    a. 07/2018 s/p SJM Assurity MRI model PM2271 (Ser# [id number]).  . Sleep apnea    "using nasal pilllows since ~ 12/2017" (07/22/2018)  . TMJ (dislocation of temporomandibular joint)    Family History  Problem Relation Age of Onset  . Stroke Mother   . Atrial fibrillation Mother   . Breast cancer Mother   . Stroke Father   . Breast cancer Maternal Aunt   . Breast cancer Paternal Aunt    Past Surgical History:  Procedure Laterality Date  . APPENDECTOMY    . BREAST CYST ASPIRATION Bilateral   . BREAST CYST EXCISION Right 1978   benign  . COLONOSCOPY    . COLONOSCOPY WITH PROPOFOL N/A 07/10/2019   Procedure: COLONOSCOPY WITH PROPOFOL;  Surgeon: Lollie Sails, MD;  Location: [facility] ENDOSCOPY;  Service: Endoscopy;  Laterality: N/A;  .  ESOPHAGOGASTRODUODENOSCOPY (EGD) WITH PROPOFOL N/A 07/10/2019   Procedure: ESOPHAGOGASTRODUODENOSCOPY (EGD) WITH PROPOFOL;  Surgeon: Lollie Sails, MD;  Location: [facility] ENDOSCOPY;  Service: Endoscopy;  Laterality: N/A;  . HYSTEROSCOPY WITH D & C N/A 11/05/2018   Procedure: DILATATION AND CURETTAGE /HYSTEROSCOPY;  Surgeon: Homero Fellers, MD;  Location: [facility] ORS;  Service: Gynecology;  Laterality: N/A;  . INSERT / REPLACE / REMOVE PACEMAKER    . 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 UTERINE POLYPS REMOVAL      Short Social History:  Social History   Tobacco Use  . Smoking status: Never Smoker  . Smokeless tobacco: Never Used  . Tobacco comment: 07/22/2018 "smoked some in college; 1960s;  nothing since"  Substance Use Topics  . Alcohol use: Yes    Comment: rarely    Allergies  Allergen Reactions  . Etanercept     Guillain Baree  . Nickel     Current Outpatient Medications  Medication Sig Dispense Refill  . acetaminophen (TYLENOL) 325 MG tablet Take 325-650 mg by mouth every 6 (six) hours as needed for mild pain or headache.     . hydrocerin (EUCERIN) CREA  Apply 1 application topically 2 (two) times daily. To bilateral feet.  0  . ketoconazole (NIZORAL) 2 % cream Apply 1 application topically daily. 15 g 0  . losartan (COZAAR) 25 MG tablet Take 1 tablet (25 mg total) by mouth daily. 90 tablet 3  . polyethylene glycol (MIRALAX / GLYCOLAX) 17 g packet Take 17 g by mouth daily as needed for severe constipation. 14 each 0  . tacrolimus (PROTOPIC) 0.1 % ointment Apply topically 2 (two) times daily as needed (for psoriasis).     . Vitamin D, Ergocalciferol, 50 MCG (2000 UT) CAPS Take by mouth.     No current facility-administered medications for this visit.    Review of Systems  Constitutional: Negative for chills, fatigue, fever and unexpected weight change.  HENT: Negative for trouble  swallowing.  Eyes: Negative for loss of vision.  Respiratory: Negative for cough, shortness of breath and wheezing.  Cardiovascular: Negative for chest pain, leg swelling, palpitations and syncope.  GI: Negative for abdominal pain, blood in stool, diarrhea, nausea and vomiting.  GU: Negative for difficulty urinating, dysuria, frequency and hematuria.  Musculoskeletal: Negative for back pain, leg pain and joint pain.  Skin: Negative for rash.  Neurological: Negative for dizziness, headaches, light-headedness, numbness and seizures.  Psychiatric: Negative for behavioral problem, confusion, depressed mood and sleep disturbance.        Objective:  Objective   Vitals:   11/29/20 1357  BP: 120/70  Weight: 176 lb 6.4 oz (80 kg)  Height: 5\' 6"  (1.676 m)   Body mass index is 28.47 kg/m.  Physical Exam Vitals and nursing note reviewed.  Constitutional:      Appearance: She is well-developed and well-nourished.  HENT:     Head: Normocephalic and atraumatic.  Eyes:     Extraocular Movements: EOM normal.     Pupils: Pupils are equal, round, and reactive to light.  Cardiovascular:     Rate and Rhythm: Normal rate and regular rhythm.  Pulmonary:     Effort: Pulmonary effort is normal. No respiratory distress.  Skin:    General: Skin is warm and dry.  Neurological:     Mental Status: She is alert and oriented to person, place, and time.  Psychiatric:        Mood and Affect: Mood and affect normal.        Behavior: Behavior normal.        Thought Content: Thought content normal.        Judgment: Judgment normal.     Assessment/Plan:     80 yo with left ovarian cyst.  Gynecological oncology agrees with monitoring plan and patient continues to prefer to avoid surgery. Will plan for repeat US and labs in 5 months. Encouraged patient to follow up sooner if she had bleeding, abdominal pain or bloating or changes in bowel movements.   More than 5 minutes were spent face to face with  the patient in the room, reviewing the medical record, labs and images, and coordinating care for the patient. The plan of management was discussed in detail and counseling was provided.       Adrian Prows MD [facility], [facility] 11/29/2020 2:14 PM

## 2020-11-30 DIAGNOSIS — R2689 Other abnormalities of gait and mobility: Secondary | ICD-10-CM | POA: Diagnosis not present

## 2020-11-30 DIAGNOSIS — M6281 Muscle weakness (generalized): Secondary | ICD-10-CM | POA: Diagnosis not present

## 2020-11-30 DIAGNOSIS — G610 Guillain-Barre syndrome: Secondary | ICD-10-CM | POA: Diagnosis not present

## 2020-11-30 DIAGNOSIS — R279 Unspecified lack of coordination: Secondary | ICD-10-CM | POA: Diagnosis not present

## 2020-12-02 DIAGNOSIS — G610 Guillain-Barre syndrome: Secondary | ICD-10-CM | POA: Diagnosis not present

## 2020-12-02 DIAGNOSIS — R2689 Other abnormalities of gait and mobility: Secondary | ICD-10-CM | POA: Diagnosis not present

## 2020-12-02 DIAGNOSIS — M6281 Muscle weakness (generalized): Secondary | ICD-10-CM | POA: Diagnosis not present

## 2020-12-02 DIAGNOSIS — R279 Unspecified lack of coordination: Secondary | ICD-10-CM | POA: Diagnosis not present

## 2020-12-03 ENCOUNTER — Encounter: Payer: Self-pay | Admitting: Neurology

## 2020-12-03 ENCOUNTER — Other Ambulatory Visit: Payer: Self-pay

## 2020-12-03 ENCOUNTER — Ambulatory Visit (INDEPENDENT_AMBULATORY_CARE_PROVIDER_SITE_OTHER): Payer: Medicare Other | Admitting: Neurology

## 2020-12-03 VITALS — BP 151/80 | HR 76 | Ht 66.0 in | Wt 173.0 lb

## 2020-12-03 DIAGNOSIS — G610 Guillain-Barre syndrome: Secondary | ICD-10-CM | POA: Diagnosis not present

## 2020-12-03 NOTE — Patient Instructions (Addendum)
Continue your home exercises  Return to clinic in 6 months 

## 2020-12-03 NOTE — Progress Notes (Signed)
Follow-up Visit   Date: 12/03/20   Alison Huffman MRN: [id number] DOB: [birth year]   Interim History: Alison Huffman is a 80 y.o. right-handed Caucasian female with cardiac arrythmia s/p pacemaker, psoratic arthritis, hypertension, OSA, GERD, and hyperlipidemia returning to the clinic for follow-up of Guillain Barre Syndrome.  The patient was accompanied to the clinic by self.  History of present illness: She presented to [facility] in June and transferred to [facility] with ascending paresthesias, proximal weakness (3/5 hip flexion, 3/5 shoulder extension), dysphagia, and respiratory failure about a week following treatment with Enbrel. She had a complex hospital course with  hyponatremia-induced (Na 119) seizure, respiratory failure requiring resuscitation, bilateral pneumothorax, and right vocal cord paralysis.  Work-up included CSF testing which did not show albuminocytologic dissociation, but given high clinical suspicion was treated for Guillain Barre syndrome with IVIG but did not respond.  She responded to plasmapheresis x 7 session. MRI brain, cervical spine, and thoracic spine was unremarkable.  She slowly improved and discharged to rehab then SNF, where she continued to make improvements.  She is now walking with a rollator/cane and able to walk a few steps unassisted.  No dysarthria, dysphagia, double vision, or limb weakness.  She still have numbness/tingling in the lower legs/feet. She is now walking with a rollator and able to take steps unassisted.  No falls.   UPDATE 12/03/2020:  She is here for follow-up visit.  She has NCS/EMG in December which was consistent with chronic demyelinating and axonal neuropathy.  She has been very compliant with PT/OT and made significant improvement.  She is no longer using a walker and able to walk unassisted at home, and uses a cone for long distances.  Numbness remains in the distal feet. No new complaints.    Medications:  Current Outpatient Medications on File Prior to Visit  Medication Sig Dispense Refill  . acetaminophen (TYLENOL) 325 MG tablet Take 325-650 mg by mouth every 6 (six) hours as needed for mild pain or headache.     . hydrocerin (EUCERIN) CREA Apply 1 application topically 2 (two) times daily. To bilateral feet.  0  . ketoconazole (NIZORAL) 2 % cream Apply 1 application topically daily. 15 g 0  . losartan (COZAAR) 25 MG tablet Take 1 tablet (25 mg total) by mouth daily. 90 tablet 3  . polyethylene glycol (MIRALAX / GLYCOLAX) 17 g packet Take 17 g by mouth daily as needed for severe constipation. 14 each 0  . tacrolimus (PROTOPIC) 0.1 % ointment Apply topically 2 (two) times daily as needed (for psoriasis).     . Vitamin D, Ergocalciferol, 50 MCG (2000 UT) CAPS Take by mouth.     No current facility-administered medications on file prior to visit.    Allergies:  Allergies  Allergen Reactions  . Etanercept     Guillain Baree  . Nickel     Vital Signs:  BP (!) 151/80   Pulse 76   Ht 5\' 6"  (1.676 m)   Wt 173 lb (78.5 kg)   SpO2 96%   BMI 27.92 kg/m   Neurological Exam: MENTAL STATUS including orientation to time, place, person, recent and remote memory, attention span and concentration, language, and fund of knowledge is normal.  Speech is not dysarthric.  CRANIAL NERVES:  No visual field defects.  Pupils equal round and reactive to light.  Normal conjugate, extra-ocular eye movements in all directions of gaze.  No ptosis.  Face is symmetric. Palate elevates  symmetrically.  Tongue is midline.  MOTOR:  Motor strength is 5/5 in all extremities, including distally.  No atrophy, fasciculations or abnormal movements.  No pronator drift.  Tone is normal.    MSRs:                                           Right        Left brachioradialis 2+  2+  biceps 2+  2+  triceps 2+  2+  patellar 1+  2+  ankle jerk 0  0   SENSORY:  Intact to vibration throughout, including  ankles bilaterally.  Vibration reduced to 50% at the right great toe and absent on the left great toe.   COORDINATION/GAIT:  Normal finger-to- nose-finger.  Intact rapid alternating movements bilaterally.  Gait narrow based and stable.  Mild unsteadiness with tandem and stressed gait.  Data: NCS/EMG of the left arm and leg 09/15/2020: 1. 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 2. 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  Initially treated with IVIG (no response) and then plasmapheresis.  She has made marked improvement over the past 9 months.  Strength is 5/5 throughout.  Sensation is also markedly improved with only distal paresthesias.   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   Return to clinic in 6 months   Thank you for allowing me to participate in patient's care.  If I can answer any additional questions, I would be pleased to do so.    Sincerely,    Donika K. Posey Pronto, DO

## 2020-12-06 DIAGNOSIS — R279 Unspecified lack of coordination: Secondary | ICD-10-CM | POA: Diagnosis not present

## 2020-12-06 DIAGNOSIS — G610 Guillain-Barre syndrome: Secondary | ICD-10-CM | POA: Diagnosis not present

## 2020-12-06 DIAGNOSIS — R2689 Other abnormalities of gait and mobility: Secondary | ICD-10-CM | POA: Diagnosis not present

## 2020-12-06 DIAGNOSIS — M6281 Muscle weakness (generalized): Secondary | ICD-10-CM | POA: Diagnosis not present

## 2020-12-09 DIAGNOSIS — M6281 Muscle weakness (generalized): Secondary | ICD-10-CM | POA: Diagnosis not present

## 2020-12-09 DIAGNOSIS — G610 Guillain-Barre syndrome: Secondary | ICD-10-CM | POA: Diagnosis not present

## 2020-12-09 DIAGNOSIS — R2689 Other abnormalities of gait and mobility: Secondary | ICD-10-CM | POA: Diagnosis not present

## 2020-12-09 DIAGNOSIS — R279 Unspecified lack of coordination: Secondary | ICD-10-CM | POA: Diagnosis not present

## 2020-12-13 DIAGNOSIS — M6281 Muscle weakness (generalized): Secondary | ICD-10-CM | POA: Diagnosis not present

## 2020-12-13 DIAGNOSIS — G610 Guillain-Barre syndrome: Secondary | ICD-10-CM | POA: Diagnosis not present

## 2020-12-13 DIAGNOSIS — R279 Unspecified lack of coordination: Secondary | ICD-10-CM | POA: Diagnosis not present

## 2020-12-13 DIAGNOSIS — R2689 Other abnormalities of gait and mobility: Secondary | ICD-10-CM | POA: Diagnosis not present

## 2020-12-15 ENCOUNTER — Other Ambulatory Visit: Payer: Self-pay

## 2020-12-15 ENCOUNTER — Ambulatory Visit
Admission: RE | Admit: 2020-12-15 | Discharge: 2020-12-15 | Disposition: A | Discharge status: Home / Self Care | Payer: Medicare Other | Source: Ambulatory Visit | Attending: Pulmonary Disease | Admitting: Pulmonary Disease

## 2020-12-15 DIAGNOSIS — R918 Other nonspecific abnormal finding of lung field: Secondary | ICD-10-CM | POA: Insufficient documentation

## 2020-12-15 DIAGNOSIS — I2510 Atherosclerotic heart disease of native coronary artery without angina pectoris: Secondary | ICD-10-CM | POA: Diagnosis not present

## 2020-12-15 DIAGNOSIS — J479 Bronchiectasis, uncomplicated: Secondary | ICD-10-CM | POA: Diagnosis not present

## 2020-12-15 DIAGNOSIS — J9811 Atelectasis: Secondary | ICD-10-CM | POA: Diagnosis not present

## 2020-12-15 DIAGNOSIS — I700 Atherosclerosis of aorta: Secondary | ICD-10-CM | POA: Diagnosis not present

## 2020-12-17 DIAGNOSIS — R279 Unspecified lack of coordination: Secondary | ICD-10-CM | POA: Diagnosis not present

## 2020-12-17 DIAGNOSIS — R2689 Other abnormalities of gait and mobility: Secondary | ICD-10-CM | POA: Diagnosis not present

## 2020-12-17 DIAGNOSIS — G610 Guillain-Barre syndrome: Secondary | ICD-10-CM | POA: Diagnosis not present

## 2020-12-17 DIAGNOSIS — M6281 Muscle weakness (generalized): Secondary | ICD-10-CM | POA: Diagnosis not present

## 2020-12-20 DIAGNOSIS — G610 Guillain-Barre syndrome: Secondary | ICD-10-CM | POA: Diagnosis not present

## 2020-12-20 DIAGNOSIS — R2689 Other abnormalities of gait and mobility: Secondary | ICD-10-CM | POA: Diagnosis not present

## 2020-12-20 DIAGNOSIS — R279 Unspecified lack of coordination: Secondary | ICD-10-CM | POA: Diagnosis not present

## 2020-12-20 DIAGNOSIS — M6281 Muscle weakness (generalized): Secondary | ICD-10-CM | POA: Diagnosis not present

## 2020-12-22 ENCOUNTER — Encounter
Payer: Medicare Other | Attending: Physical Medicine and Rehabilitation | Admitting: Physical Medicine and Rehabilitation

## 2020-12-22 ENCOUNTER — Other Ambulatory Visit: Payer: Self-pay

## 2020-12-22 ENCOUNTER — Encounter: Payer: Self-pay | Admitting: Physical Medicine and Rehabilitation

## 2020-12-22 VITALS — BP 148/74 | HR 70 | Temp 98.2°F | Ht 66.0 in | Wt 177.6 lb

## 2020-12-22 DIAGNOSIS — G933 Postviral fatigue syndrome: Secondary | ICD-10-CM | POA: Diagnosis not present

## 2020-12-22 DIAGNOSIS — G610 Guillain-Barre syndrome: Secondary | ICD-10-CM | POA: Diagnosis not present

## 2020-12-22 DIAGNOSIS — G9331 Postviral fatigue syndrome: Secondary | ICD-10-CM | POA: Insufficient documentation

## 2020-12-22 DIAGNOSIS — R29898 Other symptoms and signs involving the musculoskeletal system: Secondary | ICD-10-CM | POA: Insufficient documentation

## 2020-12-22 NOTE — Progress Notes (Signed)
Subjective:    Patient ID: Alison Huffman, female    DOB: 10/13/1940, 80 y.o.   MRN: [id number]  HPI   Patient is an 80 yr old female with Guillain Barre syndrome-  Was dx'd in June/July 2021 (stopped breathing July 1st 2021)- hx of OSA, dCHF, chest tubes due to B/L pneumothorax due to Code blue/being bagged, seizure, hyper and hyponatremia- acute; S/P IVIG and plasmapharesis.  As well as R vocal cord paralysis. Now eating regular diet! And dysphagia has resolved.  Hand arthritis.   Here for f/u on Guillain Barre syndrome and weakness.   Was told can walk without cane- at home, and short distances- by PT- but prior to Guillain Barre syndrome, didn't use a cane to walk.   Forgot cane a few days ago- was tired when started back to get cane- but had been walking without around [facility]- for 15 minutes prior to noticing  Alison Huffman was missing!  Dampness affects voice/vocal cords- esp because has  Some post nasal drip esp with moist weather.  Did finish PT Monday, but still has a few OT sessions left- in  Pool for OT sessions.  Still has 1-2 sessions left of OT. Does it 1x/week now.   Has used exercise ball- "some"-  2x/week or so- right now.  Just right in terms of exercises has been given- for now.   Balance exercise class is to start in [facility] living community- as well as yoga and tai chi classes.   Not having much pain from GBS- not really ay at all- most pain is arthritic.    Only tingling left is in toes- can only feel when pushes on toes- steps on toes.   Tuning fork- done by Neurologist- only found 1 small spot on L big toe that she couldn't feel.   Always had a small slump- can sit up and walk straight upright more than used to.   Fatigue- is a lot less- however is SOB/DOE.  Has had all along- has talked ot Pulmonary about PCP about it- saw  Can walk at least 100-150 ft if not more at one time.  Walked outside the other day-  Has tried to walk on uneven  ground- but stays away from it unless has to .   Has had no near falls or falls - the last time 3-4 years ago.    Had CT scan of chest last week- repeat- and has f/u for that 01/23/21.   IMPRESSION: 1. Multiple stable pulmonary nodules measuring up to 6 mm. Recommend CT at 18-24 months (from date of CT angio chest 06/04/2020) is considered optional for low-risk patients, but is recommended for high-risk patients. This recommendation follows the consensus statement: Guidelines for Management of Incidental Pulmonary Nodules Detected on CT Images: From the Fleischner Society 2017; Radiology 2017; 284:228-243. 2. Stable appearing mild bronchiectasis of the right lower lobe. 3. Scattered subpleural vague ground-glass airspace opacities likely representing atelectasis. Interval resolution of nodular-like ground-glass airspace opacity within the right lower lobe. 4. Prominent heart. 5. Aortic Atherosclerosis (ICD10-I70.0) including mild coronary artery calcifications.  Pain Inventory Average Pain 1 Pain Right Now 0 My pain is sharp, burning, tingling and aching  In the last 24 hours, has pain interfered with the following? General activity 0 Relation with others 0 Enjoyment of life 0 What TIME of day is your pain at its worst? evening Sleep (in general) Fair  Pain is worse with: some activites Pain improves with: rest, heat/ice  and medication Relief from Meds: 8  Family History  Problem Relation Age of Onset  . Stroke Mother   . Atrial fibrillation Mother   . Breast cancer Mother   . Stroke Father   . Breast cancer Maternal Aunt   . Breast cancer Paternal Aunt    Social History   Socioeconomic History  . Marital status: Single    Spouse name: Not on file  . Number of children: Not on file  . Years of education: Not on file  . Highest education level: Not on file  Occupational History  . Not on file  Tobacco Use  . Smoking status: Never Smoker  . Smokeless tobacco:  Never Used  . Tobacco comment: 07/22/2018 "smoked some in college; 1960s;  nothing since"  Vaping Use  . Vaping Use: Never used  Substance and Sexual Activity  . Alcohol use: Yes    Comment: rarely  . Drug use: Never  . Sexual activity: Not Currently    Birth control/protection: Post-menopausal  Other Topics Concern  . Not on file  Social History Narrative   Right Handed   Lives in an apartment. 5th floor but has elevator    Drinks Caffeine.    Social Determinants of Health   Financial Resource Strain: Low Risk   . Difficulty of Paying Living Expenses: Not hard at all  Food Insecurity: No Food Insecurity  . Worried About Charity fundraiser in the Last Year: Never true  . Ran Out of Food in the Last Year: Never true  Transportation Needs: No Transportation Needs  . Lack of Transportation (Medical): No  . Lack of Transportation (Non-Medical): No  Physical Activity: Sufficiently Active  . Days of Exercise per Week: 4 days  . Minutes of Exercise per Session: 60 min  Stress: No Stress Concern Present  . Feeling of Stress : Not at all  Social Connections: Not on file   Past Surgical History:  Procedure Laterality Date  . APPENDECTOMY    . BREAST CYST ASPIRATION Bilateral   . BREAST CYST EXCISION Right 1978   benign  . COLONOSCOPY    . COLONOSCOPY WITH PROPOFOL N/A 07/10/2019   Procedure: COLONOSCOPY WITH PROPOFOL;  Surgeon: Lollie Sails, MD;  Location: [facility] ENDOSCOPY;  Service: Endoscopy;  Laterality: N/A;  . ESOPHAGOGASTRODUODENOSCOPY (EGD) WITH PROPOFOL N/A 07/10/2019   Procedure: ESOPHAGOGASTRODUODENOSCOPY (EGD) WITH PROPOFOL;  Surgeon: Lollie Sails, MD;  Location: [facility] ENDOSCOPY;  Service: Endoscopy;  Laterality: N/A;  . HYSTEROSCOPY WITH D & C N/A 11/05/2018   Procedure: DILATATION AND CURETTAGE /HYSTEROSCOPY;  Surgeon: Homero Fellers, MD;  Location: [facility] ORS;  Service: Gynecology;  Laterality: N/A;  . INSERT / REPLACE / REMOVE PACEMAKER    . 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 UTERINE POLYPS REMOVAL     Past Surgical History:  Procedure Laterality Date  . APPENDECTOMY    . BREAST CYST ASPIRATION Bilateral   . BREAST CYST EXCISION Right 1978   benign  . COLONOSCOPY    . COLONOSCOPY WITH PROPOFOL N/A 07/10/2019   Procedure: COLONOSCOPY WITH PROPOFOL;  Surgeon: Lollie Sails, MD;  Location: [facility] ENDOSCOPY;  Service: Endoscopy;  Laterality: N/A;  . ESOPHAGOGASTRODUODENOSCOPY (EGD) WITH PROPOFOL N/A 07/10/2019   Procedure: ESOPHAGOGASTRODUODENOSCOPY (EGD) WITH PROPOFOL;  Surgeon: Lollie Sails, MD;  Location: [facility] ENDOSCOPY;  Service: Endoscopy;  Laterality: N/A;  . HYSTEROSCOPY WITH D & C  N/A 11/05/2018   Procedure: DILATATION AND CURETTAGE /HYSTEROSCOPY;  Surgeon: Homero Fellers, MD;  Location: [facility] ORS;  Service: Gynecology;  Laterality: N/A;  . INSERT / REPLACE / REMOVE PACEMAKER    . 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 UTERINE POLYPS REMOVAL     Past Medical History:  Diagnosis Date  . Ankle pain, right   . Atypical chest pain    a. 10/2018 MV: EF 57%. No ischemia/infarct.  . Coronary artery disease   . Diastolic dysfunction    a. 04/2018 Echo: EF 55-60%, no rwma, Gr1 DD. Triv AI, mild MR. Mod dil LA. Nl RV fxn. PASP nl.  . GERD (gastroesophageal reflux disease)   . Headache    "dull one sometimes daily, at least weekly in last couple months" (07/22/2018)  . Hyperlipidemia   . Hypertension   . 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 Joint pain in fingers of right hand   . Lichen sclerosus   . Osteopenia   . Pneumonia 2012? X 1  . 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 Psoriasis   . Psoriatic arthritis ([place])    " dx'd the 1st of this year, 2019" (07/22/2018)  . Psoriatic arthritis ([place])   . Rosacea   . Seborrheic keratosis   . Second degree  heart block    a. 07/2018 s/p SJM Assurity MRI model [id number] (Ser# [id number]).  . Sleep apnea    "using nasal pilllows since ~ 12/2017" (07/22/2018)  . TMJ (dislocation of temporomandibular joint)    BP (!) 148/74   Pulse 70   Temp 98.2 F (36.8 C)   Ht 5' 6"  (1.676 m)   Wt 177 lb 9.6 oz (80.6 kg)   SpO2 97%   BMI 28.67 kg/m   Opioid Risk Score:   Fall Risk Score:  `1  Depression screen PHQ 2/9  Depression screen [facility] 2/9 09/21/2020 07/26/2020 06/21/2020 05/28/2020 03/24/2020 09/22/2019 09/19/2019  Decreased Interest 0 0 0 0 0 0 0  Down, Depressed, Hopeless 0 0 0 0 0 0 0  PHQ - 2 Score 0 0 0 0 0 0 0  Altered sleeping - - - 1 - - -  Tired, decreased energy - - - 1 - - -  Change in appetite - - - 0 - - -  Feeling bad or failure about yourself  - - - 0 - - -  Trouble concentrating - - - 0 - - -  Moving slowly or fidgety/restless - - - 0 - - -  Suicidal thoughts - - - 0 - - -  PHQ-9 Score - - - 2 - - -  Difficult doing work/chores - - - Not difficult at all - - -  Some recent data might be hidden   Review of Systems  Gastrointestinal: Negative for abdominal pain.  Musculoskeletal:       Knee pain  All other systems reviewed and are negative.      Objective:   Physical Exam  Awake, alert, appropriate, stood without cane, slight slump in posture, which is "normal for her". NAD Has cane with 4 pronged knob on it-  MS: RUE- Deltoid, biceps, 5-/5 WE 5/5, grip 5-/5 and finger abd 5-/5 LUE- 5/5 except finger abd 5-/5 RLE-  HF 5/5, KE and KF 5/5, DF 5-/5, and PF 5/5 LLE- all 5/5 in same muscles tested  Can sit to stand immediately  without using hands- no struggle  Neuro: Sensation to light touch in L1-S1 dermatomes all normal/intact as well as in UEs- C4-T1 B/L   Has some core weakness still when tested to confrontation.        Assessment & Plan:    Patient is an 80 yr old female with Guillain Barre syndrome-  Was dx'd in June/July 2021 (stopped breathing July 1st  2021)- hx of OSA, dCHF, chest tubes due to B/L pneumothorax due to Code blue/being bagged, seizure, hyper and hyponatremia- acute; S/P IVIG and plasmapharesis.  As well as R vocal cord paralysis. Now eating regular diet! And dysphagia has resolved.  Hand arthritis.   Here for f/u on Guillain Barre syndrome and weakness.    1. Liver/gallbladder U/S in 3-4 months- from now- due to sludge and hepatic steatosis. Will defer to pt to schedule.   2. You have recovered at least 95-98% of where you were- it's amazing! I am so grateful for that and I know she is too!  3. Don't stop doing Home exercise program Please use exercise ball or a class for 30 minutes 3+ days/week- can lose strength much faster than other people.   4. Path around pond at home- walk around 7x is 1 mile- so I suggest with weather getting better, to get outside more.   5. The very mild weakness of dorsiflexion of ankle- bringing up toes, is likely why you fatigue with gait.  You don't need a brace for ankles, too good for that. I bet you have it in both, depending on the day. But only caught R ankle mild weakness today.   6. Can work with medicine ball and throw back and forth with a friend or do volley ball.   7. Trying to prevent falls- and get you to 100! So doing core exercises will get you there better than anything.   8. Look up core exercises on exercise ball- you tube is great for seeing videos of exercises, but can also look in general.   9. F/U 4 months- and if doing well- will d/c her.   I spent a total of 25 minutes on visit- educating about core exercises- and fall risk and expanding her exercise routine- as above.

## 2020-12-22 NOTE — Patient Instructions (Signed)
  Patient is an 80 yr old female with Guillain Barre syndrome-  Was dx'd in June/July 2021 (stopped breathing July 1st 2021)- hx of OSA, dCHF, chest tubes due to B/L pneumothorax due to Code blue/being bagged, seizure, hyper and hyponatremia- acute; S/P IVIG and plasmapharesis.  As well as R vocal cord paralysis. Now eating regular diet! And dysphagia has resolved.  Hand arthritis.   Here for f/u on Guillain Barre syndrome and weakness.    1. Liver/gallbladder U/S in 3-4 months- from now- due to sludge and hepatic steatosis. Will defer to pt to schedule.   2. You have recovered at least 95-98% of where you were- it's amazing! I am so grateful for that and I know she is too!  3. Don't stop doing Home exercise program Please use exercise ball or a class for 30 minutes 3+ days/week- can lose strength much faster than other people.   4. Path around pond at home- walk around 7x is 1 mile- so I suggest with weather getting better, to get outside more.   5. The very mild weakness of dorsiflexion of ankle- bringing up toes, is likely why you fatigue with gait.  You don't need a brace for ankles, too good for that. I bet you have it in both, depending on the day. But only caught R ankle mild weakness today.   6. Can work with medicine ball and throw back and forth with a friend or do volley ball.   7. Trying to prevent falls- and get you to 100! So doing core exercises will get you there better than anything.   8. Look up core exercises on exercise ball- you tube is great for seeing videos of exercises, but can also look in general.   9. F/U 4 months- and if doing well- will d/c her.

## 2020-12-23 DIAGNOSIS — R490 Dysphonia: Secondary | ICD-10-CM | POA: Diagnosis not present

## 2020-12-23 DIAGNOSIS — H903 Sensorineural hearing loss, bilateral: Secondary | ICD-10-CM | POA: Diagnosis not present

## 2020-12-23 DIAGNOSIS — J0120 Acute ethmoidal sinusitis, unspecified: Secondary | ICD-10-CM | POA: Diagnosis not present

## 2020-12-23 DIAGNOSIS — J3801 Paralysis of vocal cords and larynx, unilateral: Secondary | ICD-10-CM | POA: Diagnosis not present

## 2020-12-30 DIAGNOSIS — R279 Unspecified lack of coordination: Secondary | ICD-10-CM | POA: Diagnosis not present

## 2020-12-30 DIAGNOSIS — R2689 Other abnormalities of gait and mobility: Secondary | ICD-10-CM | POA: Diagnosis not present

## 2020-12-30 DIAGNOSIS — G610 Guillain-Barre syndrome: Secondary | ICD-10-CM | POA: Diagnosis not present

## 2020-12-30 DIAGNOSIS — M6281 Muscle weakness (generalized): Secondary | ICD-10-CM | POA: Diagnosis not present

## 2021-01-06 DIAGNOSIS — M6281 Muscle weakness (generalized): Secondary | ICD-10-CM | POA: Diagnosis not present

## 2021-01-06 DIAGNOSIS — R2689 Other abnormalities of gait and mobility: Secondary | ICD-10-CM | POA: Diagnosis not present

## 2021-01-06 DIAGNOSIS — G610 Guillain-Barre syndrome: Secondary | ICD-10-CM | POA: Diagnosis not present

## 2021-01-06 DIAGNOSIS — R279 Unspecified lack of coordination: Secondary | ICD-10-CM | POA: Diagnosis not present

## 2021-01-10 ENCOUNTER — Encounter: Payer: Self-pay | Admitting: Family Medicine

## 2021-01-10 ENCOUNTER — Telehealth (INDEPENDENT_AMBULATORY_CARE_PROVIDER_SITE_OTHER): Payer: Medicare Other | Admitting: Family Medicine

## 2021-01-10 ENCOUNTER — Other Ambulatory Visit: Payer: Self-pay

## 2021-01-10 DIAGNOSIS — R195 Other fecal abnormalities: Secondary | ICD-10-CM | POA: Diagnosis not present

## 2021-01-10 NOTE — Assessment & Plan Note (Signed)
Possibly related to her prior antibiotic exposure that could be dietary mediated.  Discussed that I have no concern for C. difficile given that she has no diarrhea.  Symptoms seem to have improved over the last half a day or so.  She will monitor at this time.  If she develops recurrent mucus in her stool or she develops diarrhea she will let us know via MyChart.

## 2021-01-10 NOTE — Progress Notes (Signed)
Virtual Visit via video Note  This visit type was conducted due to national recommendations for restrictions regarding the COVID-19 pandemic (e.g. social distancing).  This format is felt to be most appropriate for this patient at this time.  All issues noted in this document were discussed and addressed.  No physical exam was performed (except for noted visual exam findings with Video Visits).   I connected with Sharlet Salina today at  8:30 AM EDT by a video enabled telemedicine application and verified that I am speaking with the correct person using two identifiers. Location patient: home Location provider: work  Persons participating in the virtual visit: patient, provider  I discussed the limitations, risks, security and privacy concerns of performing an evaluation and management service by telephone and the availability of in person appointments. I also discussed with the patient that there may be a patient responsible charge related to this service. The patient expressed understanding and agreed to proceed.  Reason for visit: same day visit  HPI: Mucus in stool: Patient notes this started 3 days ago.  She had mucus in the evening on Friday and then off and on all day on Saturday.  She would pass mucus with passing gas.  Yesterday she had some mucus in the morning though none the rest of the day.  She did have a small bowel movement late yesterday that did not have any mucus.  She notes mucus is clear.  There is no abdominal pain, blood in her stool, diarrhea, constipation, nausea, or vomiting.  No dietary changes.  She does note she was on cefdinir from 12/23/2020-01/02/2021.  This was for yellow postnasal drip and was prescribed by ENT.  She notes her stools were softer during that time though returned to normal.  The patient notes she was most concerned about having C. difficile.   ROS: See pertinent positives and negatives per HPI.  Past Medical History:  Diagnosis Date  . Ankle pain,  right   . Atypical chest pain    a. 10/2018 MV: EF 57%. No ischemia/infarct.  . Coronary artery disease   . Diastolic dysfunction    a. 04/2018 Echo: EF 55-60%, no rwma, Gr1 DD. Triv AI, mild MR. Mod dil LA. Nl RV fxn. PASP nl.  . GERD (gastroesophageal reflux disease)   . Headache    "dull one sometimes daily, at least weekly in last couple months" (07/22/2018)  . Hyperlipidemia   . Hypertension   . 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 Joint pain in fingers of right hand   . Lichen sclerosus   . Osteopenia   . Pneumonia 2012? X 1  . 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 Psoriasis   . Psoriatic arthritis ([place])    " dx'd the 1st of this year, 2019" (07/22/2018)  . Psoriatic arthritis ([place])   . Rosacea   . Seborrheic keratosis   . Second degree heart block    a. 07/2018 s/p SJM Assurity MRI model PM2271 (Ser# [id number]).  . Sleep apnea    "using nasal pilllows since ~ 12/2017" (07/22/2018)  . TMJ (dislocation of temporomandibular joint)     Past Surgical History:  Procedure Laterality Date  . APPENDECTOMY    . BREAST CYST ASPIRATION Bilateral   . BREAST CYST EXCISION Right 1978   benign  . COLONOSCOPY    . COLONOSCOPY WITH PROPOFOL N/A 07/10/2019   Procedure: COLONOSCOPY WITH PROPOFOL;  Surgeon: Lollie Sails, MD;  Location: [facility] ENDOSCOPY;  Service: Endoscopy;  Laterality: N/A;  . ESOPHAGOGASTRODUODENOSCOPY (EGD) WITH PROPOFOL N/A 07/10/2019   Procedure: ESOPHAGOGASTRODUODENOSCOPY (EGD) WITH PROPOFOL;  Surgeon: Lollie Sails, MD;  Location: [facility] ENDOSCOPY;  Service: Endoscopy;  Laterality: N/A;  . HYSTEROSCOPY WITH D & C N/A 11/05/2018   Procedure: DILATATION AND CURETTAGE /HYSTEROSCOPY;  Surgeon: Homero Fellers, MD;  Location: [facility] ORS;  Service: Gynecology;  Laterality: N/A;  . INSERT / REPLACE / REMOVE PACEMAKER    . 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 UTERINE POLYPS  REMOVAL      Family History  Problem Relation Age of Onset  . Stroke Mother   . Atrial fibrillation Mother   . Breast cancer Mother   . Stroke Father   . Breast cancer Maternal Aunt   . Breast cancer Paternal Aunt     SOCIAL HX: Former smoker   Current Outpatient Medications:  .  acetaminophen (TYLENOL) 325 MG tablet, Take 325-650 mg by mouth every 6 (six) hours as needed for mild pain or headache. , Disp: , Rfl:  .  cefdinir (OMNICEF) 300 MG capsule, , Disp: , Rfl:  .  famotidine (PEPCID) 20 MG tablet, Take 1 tablet by mouth 2 (two) times daily., Disp: , Rfl:  .  hydrocerin (EUCERIN) CREA, Apply 1 application topically 2 (two) times daily. To bilateral feet., Disp: , Rfl: 0 .  ketoconazole (NIZORAL) 2 % cream, Apply 1 application topically daily., Disp: 15 g, Rfl: 0 .  losartan (COZAAR) 25 MG tablet, Take 1 tablet (25 mg total) by mouth daily., Disp: 90 tablet, Rfl: 3 .  polyethylene glycol (MIRALAX / GLYCOLAX) 17 g packet, Take 17 g by mouth daily as needed for severe constipation., Disp: 14 each, Rfl: 0 .  tacrolimus (PROTOPIC) 0.1 % ointment, Apply topically 2 (two) times daily as needed (for psoriasis). , Disp: , Rfl:  .  Vitamin D, Ergocalciferol, 50 MCG (2000 UT) CAPS, Take by mouth., Disp: , Rfl:   EXAM:  VITALS per patient if applicable:  GENERAL: alert, oriented, appears well and in no acute distress  HEENT: atraumatic, conjunttiva clear, no obvious abnormalities on inspection of external nose and ears  NECK: normal movements of the head and neck  LUNGS: on inspection no signs of respiratory distress, breathing rate appears normal, no obvious gross SOB, gasping or wheezing  CV: no obvious cyanosis  MS: moves all visible extremities without noticeable abnormality  PSYCH/NEURO: pleasant and cooperative, no obvious depression or anxiety, speech and thought processing grossly intact  ASSESSMENT AND PLAN:  Discussed the following assessment and plan:  Problem  List Items Addressed This Visit    Mucous in stools    Possibly related to her prior antibiotic exposure that could be dietary mediated.  Discussed that I have no concern for C. difficile given that she has no diarrhea.  Symptoms seem to have improved over the last half a day or so.  She will monitor at this time.  If she develops recurrent mucus in her stool or she develops diarrhea she will let us know via MyChart.          I discussed the assessment and treatment plan with the patient. The patient was provided an opportunity to ask questions and all were answered. The patient agreed with the plan and demonstrated an understanding of the instructions.   The patient was advised to call back or seek an in-person evaluation if the symptoms  worsen or if the condition fails to improve as anticipated.  Tommi Rumps, MD

## 2021-01-12 ENCOUNTER — Ambulatory Visit (INDEPENDENT_AMBULATORY_CARE_PROVIDER_SITE_OTHER): Payer: Medicare Other

## 2021-01-12 DIAGNOSIS — I441 Atrioventricular block, second degree: Secondary | ICD-10-CM | POA: Diagnosis not present

## 2021-01-13 DIAGNOSIS — R2689 Other abnormalities of gait and mobility: Secondary | ICD-10-CM | POA: Diagnosis not present

## 2021-01-13 DIAGNOSIS — R279 Unspecified lack of coordination: Secondary | ICD-10-CM | POA: Diagnosis not present

## 2021-01-13 DIAGNOSIS — G610 Guillain-Barre syndrome: Secondary | ICD-10-CM | POA: Diagnosis not present

## 2021-01-13 DIAGNOSIS — M6281 Muscle weakness (generalized): Secondary | ICD-10-CM | POA: Diagnosis not present

## 2021-01-13 LAB — CUP PACEART REMOTE DEVICE CHECK
Battery Remaining Longevity: 131 mo
Battery Remaining Percentage: 95.5 %
Battery Voltage: 3.01 V
Brady Statistic AP VP Percent: 2.9 %
Brady Statistic AP VS Percent: 9.1 %
Brady Statistic AS VP Percent: 15 %
Brady Statistic AS VS Percent: 69 %
Brady Statistic RA Percent Paced: 6.2 %
Brady Statistic RV Percent Paced: 18 %
Date Time Interrogation Session: 20220413020013
Implantable Lead Implant Date: 20191022
Implantable Lead Implant Date: 20191022
Implantable Lead Location: 753859
Implantable Lead Location: 753860
Implantable Pulse Generator Implant Date: 20191022
Lead Channel Impedance Value: 560 Ohm
Lead Channel Impedance Value: 730 Ohm
Lead Channel Pacing Threshold Amplitude: 0.5 V
Lead Channel Pacing Threshold Amplitude: 0.75 V
Lead Channel Pacing Threshold Pulse Width: 0.5 ms
Lead Channel Pacing Threshold Pulse Width: 0.5 ms
Lead Channel Sensing Intrinsic Amplitude: 12 mV
Lead Channel Sensing Intrinsic Amplitude: 4.1 mV
Lead Channel Setting Pacing Amplitude: 1 V
Lead Channel Setting Pacing Amplitude: 2 V
Lead Channel Setting Pacing Pulse Width: 0.5 ms
Lead Channel Setting Sensing Sensitivity: 2 mV
Pulse Gen Model: 2272
Pulse Gen Serial Number: 9073848

## 2021-01-20 ENCOUNTER — Other Ambulatory Visit: Payer: Self-pay

## 2021-01-20 ENCOUNTER — Ambulatory Visit (INDEPENDENT_AMBULATORY_CARE_PROVIDER_SITE_OTHER): Payer: Medicare Other | Admitting: Pulmonary Disease

## 2021-01-20 ENCOUNTER — Encounter: Payer: Self-pay | Admitting: Pulmonary Disease

## 2021-01-20 VITALS — BP 134/72 | HR 71 | Temp 97.7°F | Ht 65.0 in | Wt 172.8 lb

## 2021-01-20 DIAGNOSIS — R918 Other nonspecific abnormal finding of lung field: Secondary | ICD-10-CM | POA: Diagnosis not present

## 2021-01-20 DIAGNOSIS — J479 Bronchiectasis, uncomplicated: Secondary | ICD-10-CM | POA: Diagnosis not present

## 2021-01-20 DIAGNOSIS — M6281 Muscle weakness (generalized): Secondary | ICD-10-CM | POA: Diagnosis not present

## 2021-01-20 DIAGNOSIS — G610 Guillain-Barre syndrome: Secondary | ICD-10-CM | POA: Diagnosis not present

## 2021-01-20 DIAGNOSIS — G4733 Obstructive sleep apnea (adult) (pediatric): Secondary | ICD-10-CM

## 2021-01-20 DIAGNOSIS — R2689 Other abnormalities of gait and mobility: Secondary | ICD-10-CM | POA: Diagnosis not present

## 2021-01-20 DIAGNOSIS — R279 Unspecified lack of coordination: Secondary | ICD-10-CM | POA: Diagnosis not present

## 2021-01-20 NOTE — Patient Instructions (Signed)
Will schedule CT chest for April 2023  Follow up in 1 year

## 2021-01-20 NOTE — Progress Notes (Signed)
[facility]  Chief Complaint  Patient presents with  . 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  Brief Summary:  Alison Huffman is a 80 y.o. female with obstructive sleep apnea, lung nodule and bronchiectasis.      Subjective:   She was in hospital last Summer for GBS.  Much improved.  No longer needing supplemental oxygen.  Able to walk on level ground at slow pace w/o dyspnea.  Gets short of breath if she walks too fast, but recovers after resting for couple of minutes.  Not having cough, chest pain, wheeze, or sputum.  Had recent course of ABx for sinusitis prescribed by ENT.  Using flonase.  CT chest shows stable nodule, mild RLL BTX, and atelectasis.  She has significant second hand tobacco exposure.  Uses CPAP nightly.  Uses nasal pillow mask.  No issue with set up.  Physical Exam:   Appearance - well kempt, walks with a cane  ENMT - no sinus tenderness, no oral exudate, no LAN, Mallampati 3 airway, no stridor, raspy voice  Respiratory - equal breath  sounds bilaterally, no wheezing or rales  CV - s1s2 regular rate and rhythm, no murmurs  Ext - no clubbing, no edema  Skin - no rashes  Psych - normal mood and affect   Chest Imaging:   CT chest 06/05/20 >> mild cylindrical BTX RLL, mild GGO Rt lung base  CT chest 12/16/20 >> stable nodules, ATX  Sleep Tests:   PSG 01/24/18 >> AHI 24.1, SpO2 low 84.4%; CPAP 8 cm H2O  Auto CPAP 12/21/20 to 01/19/21 >> used on 29 of 30 nights with average 7 hrs 30 min.  Average AHI 3 with median CPAP 7 and 95 th percentile CPAP 10 cm H2O.  Cardiac Tests:   Echo 04/12/20 >> EF 55 to 60%, grade 1 DD, mod LA dilation  Social History:  She  reports that she has never smoked. She has never used smokeless tobacco. She reports current alcohol use. She reports that she does not use drugs.  Family History:  Her family history includes Atrial fibrillation in her mother; Breast cancer in her maternal aunt, mother, and paternal aunt; Stroke in her father and mother.     Assessment/Plan:   Obstructive sleep apnea. - she is compliant with CPAP and reports benefit from therapy - gets supplies from [place] - continue auto CPAP 5 to 15 cm H2O  Lung nodules with history of second hand tobacco exposure. 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  - likely from deconditioning after Guillain Barre Syndrome - encouraged her to keep up with exercises from physical therapy  History of Guillain Barre Syndrome. - followed by Dr. Narda Amber with [facility]  Second degree heart block s/p pacemaker. - followed by Dr. Thompson Grayer with [facility]  Time Spent Involved in Patient Care on Day of Examination:  42 minutes  Follow up:  Patient Instructions  Will schedule CT chest for April  2023  Follow up in 1 year   Medication List:   Allergies as of 01/20/2021      Reactions   Etanercept    Guillain Baree   Nickel       Medication List       Accurate as of January 20, 2021  9:29 AM. If you have any questions, ask your nurse or doctor.        STOP taking these medications   cefdinir 300 MG capsule Commonly known as: OMNICEF Stopped by: Chesley Mires, MD     TAKE these medications   acetaminophen 325 MG tablet Commonly known as: TYLENOL Take 325-650 mg by mouth every 6 (six) hours as needed for mild pain or headache.   Citrucel oral powder Generic drug: methylcellulose   famotidine 20 MG tablet Commonly known as: PEPCID Take 1 tablet by mouth 2 (two) times daily.   hydrocerin Crea Apply 1 application topically 2 (two) times daily. To bilateral feet.   ketoconazole 2 % cream Commonly known as: NIZORAL Apply 1 application topically daily.   losartan 25 MG tablet Commonly known as: COZAAR Take 1 tablet (25 mg total) by mouth daily.   polyethylene glycol 17 g packet Commonly known as: MIRALAX / GLYCOLAX Take 17 g by mouth daily as needed for severe constipation.   tacrolimus 0.1 % ointment Commonly known as: PROTOPIC Apply topically 2 (two) times daily as needed (for psoriasis).   Vitamin D (Ergocalciferol) 50 MCG (2000 UT) Caps Take by mouth.       Signature:  Chesley Mires, MD [place] Pager - [phone] 01/20/2021, 9:29 AM

## 2021-01-24 NOTE — Progress Notes (Signed)
Patient ID: Alison Huffman, female   DOB: 12-Sep-1941, 80 y.o.   MRN: [id number]  Reason for Consult: Follow-up (u/s follow up )   Referred by Leone Haven, MD  Subjective:     HPI:  Alison Huffman is a 80 y.o. female. She has no new complaints. She is following up after her pelvic US.   Past Medical History:  Diagnosis Date  . Ankle pain, right   . Atypical chest pain    a. 10/2018 MV: EF 57%. No ischemia/infarct.  . Coronary artery disease   . Diastolic dysfunction    a. 04/2018 Echo: EF 55-60%, no rwma, Gr1 DD. Triv AI, mild MR. Mod dil LA. Nl RV fxn. PASP nl.  . GERD (gastroesophageal reflux disease)   . Headache    "dull one sometimes daily, at least weekly in last couple months" (07/22/2018)  . Hyperlipidemia   . Hypertension   . 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 Joint pain in fingers of right hand   . Lichen sclerosus   . Osteopenia   . Pneumonia 2012? X 1  . 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 Psoriasis   . Psoriatic arthritis ([place])    " dx'd the 1st of this year, 2019" (07/22/2018)  . Psoriatic arthritis ([place])   . Rosacea   . Seborrheic keratosis   . Second degree heart block    a. 07/2018 s/p SJM Assurity MRI model PM2271 (Ser# [id number]).  . Sleep apnea    "using nasal pilllows since ~ 12/2017" (07/22/2018)  . TMJ (dislocation of temporomandibular joint)    Family History  Problem Relation Age of Onset  . Stroke Mother   . Atrial fibrillation Mother   . Breast cancer Mother   . Stroke Father   . Breast cancer Maternal Aunt   . Breast cancer Paternal Aunt    Past Surgical History:  Procedure Laterality Date  . APPENDECTOMY    . BREAST CYST ASPIRATION Bilateral   . BREAST CYST EXCISION Right 1978   benign  . COLONOSCOPY    . COLONOSCOPY WITH PROPOFOL N/A 07/10/2019   Procedure: COLONOSCOPY WITH PROPOFOL;  Surgeon: Lollie Sails, MD;  Location: [facility] ENDOSCOPY;  Service: Endoscopy;  Laterality: N/A;  .  ESOPHAGOGASTRODUODENOSCOPY (EGD) WITH PROPOFOL N/A 07/10/2019   Procedure: ESOPHAGOGASTRODUODENOSCOPY (EGD) WITH PROPOFOL;  Surgeon: Lollie Sails, MD;  Location: [facility] ENDOSCOPY;  Service: Endoscopy;  Laterality: N/A;  . HYSTEROSCOPY WITH D & C N/A 11/05/2018   Procedure: DILATATION AND CURETTAGE /HYSTEROSCOPY;  Surgeon: Homero Fellers, MD;  Location: [facility] ORS;  Service: Gynecology;  Laterality: N/A;  . INSERT / REPLACE / REMOVE PACEMAKER    . 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 UTERINE POLYPS REMOVAL      Short Social History:  Social History   Tobacco Use  . Smoking status: Never Smoker  . Smokeless tobacco: Never Used  . Tobacco comment: 07/22/2018 "smoked some in college; 1960s;  nothing since"  Substance Use Topics  . Alcohol use: Yes    Comment: rarely    Allergies  Allergen Reactions  . Etanercept     Guillain Baree  . Nickel     Current Outpatient Medications  Medication Sig Dispense Refill  . acetaminophen (TYLENOL) 325 MG tablet Take 325-650 mg by mouth every 6 (six) hours as needed for mild pain or headache.     [name]  tacrolimus (PROTOPIC) 0.1 % ointment Apply topically 2 (two) times daily as needed (for psoriasis).     . famotidine (PEPCID) 20 MG tablet Take 1 tablet by mouth 2 (two) times daily.    . hydrocerin (EUCERIN) CREA Apply 1 application topically 2 (two) times daily. To bilateral feet.  0  . ketoconazole (NIZORAL) 2 % cream Apply 1 application topically daily. 15 g 0  . losartan (COZAAR) 25 MG tablet Take 1 tablet (25 mg total) by mouth daily. 90 tablet 3  . methylcellulose (CITRUCEL) oral powder     . polyethylene glycol (MIRALAX / GLYCOLAX) 17 g packet Take 17 g by mouth daily as needed for severe constipation. 14 each 0  . Vitamin D, Ergocalciferol, 50 MCG (2000 UT) CAPS Take by mouth.     No current facility-administered medications for this visit.    REVIEW OF  SYSTEMS      Objective:  Objective   Vitals:   12/12/19 1106  BP: 128/80  Weight: 170 lb (77.1 kg)  Height: 5\' 6"  (1.676 m)   Body mass index is 27.44 kg/m.  Physical Exam  Assessment/Plan:     80 yo Reviewed [place] results, Will check ROMA and follow up by phone.     Adrian Prows MD [facility], [facility] 01/24/2021 3:32 PM

## 2021-01-24 NOTE — Progress Notes (Signed)
Patient ID: Alison Huffman, female   DOB: 19-Oct-1940, 80 y.o.   MRN: [id number]  Reason for Consult: Follow-up   Referred by Leone Haven, MD  Subjective:     HPI:  Alison Huffman is a 80 y.o. female. She is following up from a pelvic US. She has no new complaints  Gynecological History  No LMP recorded. Patient is postmenopausal.   History of fibroids, polyps, or ovarian cysts? : no  History of PCOS? no Hstory of Endometriosis? no History of abnormal pap smears? no Have you had any sexually transmitted infections in the past? no    Past Medical History:  Diagnosis Date  . Ankle pain, right   . Atypical chest pain    a. 10/2018 MV: EF 57%. No ischemia/infarct.  . Coronary artery disease   . Diastolic dysfunction    a. 04/2018 Echo: EF 55-60%, no rwma, Gr1 DD. Triv AI, mild MR. Mod dil LA. Nl RV fxn. PASP nl.  . GERD (gastroesophageal reflux disease)   . Headache    "dull one sometimes daily, at least weekly in last couple months" (07/22/2018)  . Hyperlipidemia   . Hypertension   . 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 Joint pain in fingers of right hand   . Lichen sclerosus   . Osteopenia   . Pneumonia 2012? X 1  . 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 Psoriasis   . Psoriatic arthritis ([place])    " dx'd the 1st of this year, 2019" (07/22/2018)  . Psoriatic arthritis ([place])   . Rosacea   . Seborrheic keratosis   . Second degree heart block    a. 07/2018 s/p SJM Assurity MRI model PM2271 (Ser# [id number]).  . Sleep apnea    "using nasal pilllows since ~ 12/2017" (07/22/2018)  . TMJ (dislocation of temporomandibular joint)    Family History  Problem Relation Age of Onset  . Stroke Mother   . Atrial fibrillation Mother   . Breast cancer Mother   . Stroke Father   . Breast cancer Maternal Aunt   . Breast cancer Paternal Aunt    Past Surgical History:  Procedure Laterality Date  . APPENDECTOMY    . BREAST CYST ASPIRATION Bilateral   . BREAST  CYST EXCISION Right 1978   benign  . COLONOSCOPY    . COLONOSCOPY WITH PROPOFOL N/A 07/10/2019   Procedure: COLONOSCOPY WITH PROPOFOL;  Surgeon: Lollie Sails, MD;  Location[facility] ENDOSCOPY;  Service: Endoscopy;  Laterality: N/A;  . ESOPHAGOGASTRODUODENOSCOPY (EGD) WITH PROPOFOL N/A 07/10/2019   Procedure: ESOPHAGOGASTRODUODENOSCOPY (EGD) WITH PROPOFOL;  Surgeon: Lollie Sails, MD;  Location: [facility] ENDOSCOPY;  Service: Endoscopy;  Laterality: N/A;  . HYSTEROSCOPY WITH D & C N/A 11/05/2018   Procedure: DILATATION AND CURETTAGE /HYSTEROSCOPY;  Surgeon: Homero Fellers, MD;  Location: [facility] ORS;  Service: Gynecology;  Laterality: N/A;  . INSERT / REPLACE / REMOVE PACEMAKER    . 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 UTERINE POLYPS REMOVAL      Short Social History:  Social History   Tobacco Use  . Smoking status: Never Smoker  . Smokeless tobacco: Never Used  . Tobacco comment: 07/22/2018 "smoked some in college; 1960s;  nothing since"  Substance Use Topics  . Alcohol use: Yes    Comment: rarely    Allergies  Allergen Reactions  . Etanercept     Guillain  Baree  . Nickel     Current Outpatient Medications  Medication Sig Dispense Refill  . acetaminophen (TYLENOL) 325 MG tablet Take 325-650 mg by mouth every 6 (six) hours as needed for mild pain or headache.     . famotidine (PEPCID) 20 MG tablet Take 1 tablet by mouth 2 (two) times daily.    . hydrocerin (EUCERIN) CREA Apply 1 application topically 2 (two) times daily. To bilateral feet.  0  . ketoconazole (NIZORAL) 2 % cream Apply 1 application topically daily. 15 g 0  . losartan (COZAAR) 25 MG tablet Take 1 tablet (25 mg total) by mouth daily. 90 tablet 3  . methylcellulose (CITRUCEL) oral powder     . polyethylene glycol (MIRALAX / GLYCOLAX) 17 g packet Take 17 g by mouth daily as needed for severe constipation. 14 each 0  . tacrolimus  (PROTOPIC) 0.1 % ointment Apply topically 2 (two) times daily as needed (for psoriasis).     . Vitamin D, Ergocalciferol, 50 MCG (2000 UT) CAPS Take by mouth.     No current facility-administered medications for this visit.    REVIEW OF SYSTEMS      Objective:  Objective   Vitals:   07/03/19 0807  BP: 126/84  Weight: 171 lb (77.6 kg)  Height: 5\' 6"  (1.676 m)   Body mass index is 27.6 kg/m.  Physical Exam  Assessment/Plan:    80 yo with ovarian cyst.  Continue to monitor ovarian cyst with [place] every 3-6 months.  Consider surgical evaluation for any changes.  Patient preference is strongly with monitoring instead of surgical removal at this time.    Adrian Prows MD [facility], [facility] 01/24/2021 1:29 PM

## 2021-01-27 NOTE — Progress Notes (Signed)
Remote pacemaker transmission.   

## 2021-02-07 DIAGNOSIS — R279 Unspecified lack of coordination: Secondary | ICD-10-CM | POA: Diagnosis not present

## 2021-02-07 DIAGNOSIS — M6281 Muscle weakness (generalized): Secondary | ICD-10-CM | POA: Diagnosis not present

## 2021-02-07 DIAGNOSIS — G610 Guillain-Barre syndrome: Secondary | ICD-10-CM | POA: Diagnosis not present

## 2021-02-07 DIAGNOSIS — R2689 Other abnormalities of gait and mobility: Secondary | ICD-10-CM | POA: Diagnosis not present

## 2021-02-09 ENCOUNTER — Ambulatory Visit (INDEPENDENT_AMBULATORY_CARE_PROVIDER_SITE_OTHER): Payer: Medicare Other | Admitting: Family Medicine

## 2021-02-09 ENCOUNTER — Encounter: Payer: Self-pay | Admitting: Family Medicine

## 2021-02-09 ENCOUNTER — Other Ambulatory Visit: Payer: Self-pay

## 2021-02-09 VITALS — BP 110/80 | HR 67 | Temp 98.0°F | Ht 65.0 in | Wt 174.6 lb

## 2021-02-09 DIAGNOSIS — K824 Cholesterolosis of gallbladder: Secondary | ICD-10-CM

## 2021-02-09 DIAGNOSIS — M810 Age-related osteoporosis without current pathological fracture: Secondary | ICD-10-CM | POA: Diagnosis not present

## 2021-02-09 DIAGNOSIS — I10 Essential (primary) hypertension: Secondary | ICD-10-CM | POA: Diagnosis not present

## 2021-02-09 DIAGNOSIS — R0609 Other forms of dyspnea: Secondary | ICD-10-CM

## 2021-02-09 DIAGNOSIS — G4733 Obstructive sleep apnea (adult) (pediatric): Secondary | ICD-10-CM | POA: Diagnosis not present

## 2021-02-09 DIAGNOSIS — G610 Guillain-Barre syndrome: Secondary | ICD-10-CM | POA: Diagnosis not present

## 2021-02-09 DIAGNOSIS — R0600 Dyspnea, unspecified: Secondary | ICD-10-CM

## 2021-02-09 LAB — COMPREHENSIVE METABOLIC PANEL
ALT: 12 U/L (ref 0–35)
AST: 14 U/L (ref 0–37)
Albumin: 4 g/dL (ref 3.5–5.2)
Alkaline Phosphatase: 99 U/L (ref 39–117)
BUN: 13 mg/dL (ref 6–23)
CO2: 26 mEq/L (ref 19–32)
Calcium: 9.2 mg/dL (ref 8.4–10.5)
Chloride: 105 mEq/L (ref 96–112)
Creatinine, Ser: 0.55 mg/dL (ref 0.40–1.20)
GFR: 86.06 mL/min (ref >60.00)
Glucose, Bld: 86 mg/dL (ref 70–99)
Potassium: 4.1 mEq/L (ref 3.5–5.1)
Sodium: 138 mEq/L (ref 135–145)
Total Bilirubin: 0.7 mg/dL (ref 0.2–1.2)
Total Protein: 6.7 g/dL (ref 6.0–8.3)

## 2021-02-09 LAB — CBC
HCT: 37.7 % (ref 36.0–46.0)
Hemoglobin: 12.7 g/dL (ref 12.0–15.0)
MCHC: 33.6 g/dL (ref 30.0–36.0)
MCV: 91.6 fl (ref 78.0–100.0)
Platelets: 210 10*3/uL (ref 150.0–400.0)
RBC: 4.11 Mil/uL (ref 3.87–5.11)
RDW: 13.4 % (ref 11.5–15.5)
WBC: 4.9 10*3/uL (ref 4.0–10.5)

## 2021-02-09 LAB — TSH: TSH: 1.55 u[IU]/mL (ref 0.35–4.50)

## 2021-02-09 LAB — BRAIN NATRIURETIC PEPTIDE: Pro B Natriuretic peptide (BNP): 44 pg/mL (ref 0.0–100.0)

## 2021-02-09 NOTE — Progress Notes (Signed)
Tommi Rumps, MD Phone: [phone]  Alison Huffman is a 80 y.o. female who presents today for f/u.  HYPERTENSION  Disease Monitoring  Home BP Monitoring 120s-150s/50s-70s Chest pain- no    Dyspnea- chronic and ongoing since her Guillain-Barr diagnosis Medications  Compliance-taking losartan.   Edema-yes mild bilateral  OSA: Wearing her CPAP nightly.  Notes her hypersomnia significantly improved.  She does wake up well rested.  She saw pulmonology for this and her CPAP was working adequately.  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  She does continue to use a cane to help her get around though per the physical medicine and rehab and neurology notes her strength has almost completely recovered.  She has had some dyspnea on exertion since having Guillain-Barr.  Pulmonology felt as though this was deconditioning related.  She notes no dyspnea when she walks at a normal pace though when she picks up her pace she does have some dyspnea.  She has noted some bilateral leg swelling that does not resolve with elevation though does resolve at times with no intervention.  No orthopnea or PND.  No chest pain.  Possible gallbladder polyp: She is due for a follow-up ultrasound in July.  Social History   Tobacco Use  Smoking Status Never Smoker  Smokeless Tobacco Never Used  Tobacco Comment   07/22/2018 "smoked some in college; 1960s;  nothing since"    Current Outpatient Medications on File Prior to Visit  Medication Sig Dispense Refill  . acetaminophen (TYLENOL) 325 MG tablet Take 325-650 mg by mouth every 6 (six) hours as needed for mild pain or headache.     . famotidine (PEPCID) 20 MG tablet Take 1 tablet by mouth 2 (two) times daily.    . hydrocerin (EUCERIN) CREA Apply 1 application topically 2 (two) times daily. To bilateral feet.  0  . ketoconazole (NIZORAL) 2 % cream Apply 1 application topically daily. 15 g 0  . losartan (COZAAR) 25 MG tablet Take 1  tablet (25 mg total) by mouth daily. 90 tablet 3  . methylcellulose (CITRUCEL) oral powder     . polyethylene glycol (MIRALAX / GLYCOLAX) 17 g packet Take 17 g by mouth daily as needed for severe constipation. 14 each 0  . tacrolimus (PROTOPIC) 0.1 % ointment Apply topically 2 (two) times daily as needed (for psoriasis).     . Vitamin D, Ergocalciferol, 50 MCG (2000 UT) CAPS Take by mouth.     No current facility-administered medications on file prior to visit.     ROS see history of present illness  Objective  Physical Exam Vitals:   02/09/21 0928  BP: 110/80  Pulse: 67  Temp: 98 F (36.7 C)  SpO2: 99%    BP Readings from Last 3 Encounters:  02/09/21 110/80  01/20/21 134/72  12/22/20 (!) 148/74   Wt Readings from Last 3 Encounters:  02/09/21 174 lb 9.6 oz (79.2 kg)  01/20/21 172 lb 12.8 oz (78.4 kg)  01/10/21 177 lb (80.3 kg)    Physical Exam Constitutional:      General: She is not in acute distress.    Appearance: She is not diaphoretic.  Cardiovascular:     Rate and Rhythm: Normal rate and regular rhythm.     Heart sounds: Normal heart sounds.  Pulmonary:     Effort: Pulmonary effort is normal.     Breath sounds: Normal breath sounds.  Musculoskeletal:     Comments: Trace pitting edema right lower leg, 1+ pitting edema  left lower leg  Skin:    General: Skin is warm and dry.  Neurological:     Mental Status: She is alert.      Assessment/Plan: Please see individual problem list.  Problem List Items Addressed This Visit    Hypertension - Primary    Decent control for her age.  Her low diastolic blood pressures limit the ability to titrate her medicines.  She will continue losartan 25 mg once daily.      OSA (obstructive sleep apnea)    Adequately controlled.  She will continue her CPAP.  She will continue to see pulmonology.      Osteoporosis    Check DEXA scan.      Relevant Orders   DG Bone Density   Dyspnea on exertion    This is an  ongoing issue since she had Guillain-Barr.  I suspect this is deconditioning related.  She has been evaluated by pulmonology.  Will obtain lab work to rule out other underlying causes.      Relevant Orders   TSH   CBC   Comp Met (CMET)   B Nat Peptide   Guillain Barr syndrome [facility])    The patient has had a remarkable recovery.  She will continue to follow with neurology and physical medicine and rehab.  She will continue to remain active.  I do believe deconditioning related to this is contributing to her shortness of breath.      Gallbladder polyp    Due for follow-up imaging in July 2022.  Ultrasound ordered.      Relevant Orders   US Abdomen Limited RUQ (LIVER/GB)     Return in about 3 months (around 05/12/2021) for Hypertension.  This visit occurred during the SARS-CoV-2 public health emergency.  Safety protocols were in place, including screening questions prior to the visit, additional usage of staff PPE, and extensive cleaning of exam room while observing appropriate contact time as indicated for disinfecting solutions.    Tommi Rumps, MD [place]

## 2021-02-09 NOTE — Patient Instructions (Signed)
Nice to see you. We will get labs and contact you with the results. Please call [phone] for your DEXA scan. Somebody will call you to schedule the ultrasound of your gallbladder.

## 2021-02-09 NOTE — Assessment & Plan Note (Signed)
This is an ongoing issue since she had Guillain-Barr.  I suspect this is deconditioning related.  She has been evaluated by pulmonology.  Will obtain lab work to rule out other underlying causes.

## 2021-02-09 NOTE — Assessment & Plan Note (Signed)
Due for follow-up imaging in July 2022.  Ultrasound ordered.

## 2021-02-09 NOTE — Assessment & Plan Note (Signed)
Decent control for her age.  Her low diastolic blood pressures limit the ability to titrate her medicines.  She will continue losartan 25 mg once daily.

## 2021-02-09 NOTE — Assessment & Plan Note (Signed)
Adequately controlled.  She will continue her CPAP.  She will continue to see pulmonology.

## 2021-02-09 NOTE — Assessment & Plan Note (Signed)
The patient has had a remarkable recovery.  She will continue to follow with neurology and physical medicine and rehab.  She will continue to remain active.  I do believe deconditioning related to this is contributing to her shortness of breath.

## 2021-02-09 NOTE — Assessment & Plan Note (Signed)
Check DEXA scan.  

## 2021-02-10 DIAGNOSIS — K828 Other specified diseases of gallbladder: Secondary | ICD-10-CM | POA: Diagnosis not present

## 2021-02-10 DIAGNOSIS — K449 Diaphragmatic hernia without obstruction or gangrene: Secondary | ICD-10-CM | POA: Diagnosis not present

## 2021-02-10 DIAGNOSIS — G610 Guillain-Barre syndrome: Secondary | ICD-10-CM | POA: Diagnosis not present

## 2021-02-10 DIAGNOSIS — K219 Gastro-esophageal reflux disease without esophagitis: Secondary | ICD-10-CM | POA: Diagnosis not present

## 2021-02-10 DIAGNOSIS — K2970 Gastritis, unspecified, without bleeding: Secondary | ICD-10-CM | POA: Diagnosis not present

## 2021-02-10 DIAGNOSIS — K5909 Other constipation: Secondary | ICD-10-CM | POA: Diagnosis not present

## 2021-02-22 ENCOUNTER — Ambulatory Visit
Admission: RE | Admit: 2021-02-22 | Discharge: 2021-02-22 | Disposition: A | Discharge status: Home / Self Care | Payer: Medicare Other | Source: Ambulatory Visit | Attending: Family Medicine | Admitting: Family Medicine

## 2021-02-22 ENCOUNTER — Other Ambulatory Visit: Payer: Self-pay

## 2021-02-22 DIAGNOSIS — M85851 Other specified disorders of bone density and structure, right thigh: Secondary | ICD-10-CM | POA: Diagnosis not present

## 2021-02-22 DIAGNOSIS — M810 Age-related osteoporosis without current pathological fracture: Secondary | ICD-10-CM | POA: Insufficient documentation

## 2021-02-25 ENCOUNTER — Telehealth: Payer: Self-pay

## 2021-02-25 NOTE — Telephone Encounter (Signed)
LMTCB in regards to lab results.  

## 2021-03-03 ENCOUNTER — Ambulatory Visit: Payer: Medicare Other | Admitting: Family Medicine

## 2021-03-07 ENCOUNTER — Ambulatory Visit: Payer: Medicare Other | Admitting: Family Medicine

## 2021-03-15 ENCOUNTER — Encounter: Payer: Self-pay | Admitting: Family Medicine

## 2021-03-15 ENCOUNTER — Ambulatory Visit (INDEPENDENT_AMBULATORY_CARE_PROVIDER_SITE_OTHER): Payer: Medicare Other | Admitting: Family Medicine

## 2021-03-15 ENCOUNTER — Other Ambulatory Visit: Payer: Self-pay

## 2021-03-15 DIAGNOSIS — M810 Age-related osteoporosis without current pathological fracture: Secondary | ICD-10-CM | POA: Diagnosis not present

## 2021-03-15 DIAGNOSIS — G610 Guillain-Barre syndrome: Secondary | ICD-10-CM | POA: Diagnosis not present

## 2021-03-15 NOTE — Progress Notes (Signed)
Tommi Rumps, MD Phone: [phone]  Alison Huffman is a 80 y.o. female who presents today for f/u.  Osteoporosis: This was based on a FRAX score with her DEXA scan.  She notes no fracture history.  She stopped calcium supplementation related to constipation.  She takes vitamin D 2000 international units daily.  She notes her swallowing is okay now though she did have issues when she had Guillain-Barr.  She does report a history of taking Fosamax and having burning in her esophagus.  She has been working on doing weightbearing exercises and walking some.  She is hesitant to start on any new medications given her recent history of Guillain-Barr syndrome with Enbrel.  History of Guillain-Barr: Patient reports doing fairly well with this.  She does have tingling and numbness occasionally in her feet or ankles.  She notes this is not moving proximally.  She notes she got her COVID booster and may possibly have worsened her symptoms a little bit though she typically only notices this when she is in sandals.  She does not notice the tingling when she wears her shoes except for if she moves her toes.  She notes no weakness.  She notes her walking has gotten quite a bit better.  She notes the symptoms are not bad enough to start on medication to treat them.  Social History   Tobacco Use  Smoking Status Never  Smokeless Tobacco Never  Tobacco Comments   07/22/2018 "smoked some in college; 1960s;  nothing since"    Current Outpatient Medications on File Prior to Visit  Medication Sig Dispense Refill   acetaminophen (TYLENOL) 325 MG tablet Take 325-650 mg by mouth every 6 (six) hours as needed for mild pain or headache.      clobetasol ointment (TEMOVATE) 0.05 %      famotidine (PEPCID) 20 MG tablet Take 1 tablet by mouth 2 (two) times daily.     hydrocerin (EUCERIN) CREA Apply 1 application topically 2 (two) times daily. To bilateral feet.  0   ketoconazole (NIZORAL) 2 % cream Apply 1  application topically daily. 15 g 0   losartan (COZAAR) 25 MG tablet Take 1 tablet (25 mg total) by mouth daily. 90 tablet 3   methylcellulose (CITRUCEL) oral powder      polyethylene glycol (MIRALAX / GLYCOLAX) 17 g packet Take 17 g by mouth daily as needed for severe constipation. 14 each 0   tacrolimus (PROTOPIC) 0.1 % ointment Apply topically 2 (two) times daily as needed (for psoriasis).      Vitamin D, Ergocalciferol, 50 MCG (2000 UT) CAPS Take by mouth.     No current facility-administered medications on file prior to visit.     ROS see history of present illness  Objective  Physical Exam Vitals:   03/15/21 0802  BP: 139/65  Pulse: (!) 52  Temp: 97.8 F (36.6 C)  SpO2: 99%    BP Readings from Last 3 Encounters:  03/15/21 139/65  02/09/21 110/80  01/20/21 134/72   Wt Readings from Last 3 Encounters:  03/15/21 175 lb 12.8 oz (79.7 kg)  02/09/21 174 lb 9.6 oz (79.2 kg)  01/20/21 172 lb 12.8 oz (78.4 kg)    Physical Exam Constitutional:      General: She is not in acute distress.    Appearance: She is not diaphoretic.  Cardiovascular:     Rate and Rhythm: Normal rate and regular rhythm.     Heart sounds: Normal heart sounds.  Pulmonary:  Effort: Pulmonary effort is normal.     Breath sounds: Normal breath sounds.  Skin:    General: Skin is warm and dry.  Neurological:     Mental Status: She is alert.     Comments: 5/5 strength in bilateral biceps, triceps, grip, quads, hamstrings, plantar and dorsiflexion, sensation to light touch intact in bilateral UE and LE     Assessment/Plan: Please see individual problem list.  Problem List Items Addressed This Visit     Osteoporosis    She declines treatment at this time.  She is concerned about side effects given her history of issues with Enbrel.  She will work on weightbearing exercises.  Her vitamin D is adequately regulated on 2000 international units/day.  Discussed 1200 mg of calcium in her diet and if  needed she could add on a calcium supplement. She is considering algaecal for calcium supplementation.  It appears this just has calcium, magnesium, and vitamin D3.  I encouraged her to be careful with supplements.       Guillain Barr syndrome [facility])    The patient seems to have improved significantly.  I encouraged continued activity.  Discussed the possibility of gabapentin and Lyrica for her tingling though she declines this as her symptoms are very mild.  She will let us know if she has any worsening symptoms.  If she has ascending tingling, numbness, or if she starts to develop weakness she will contact us and neurology immediately.        Return for as scheduled.  This visit occurred during the SARS-CoV-2 public health emergency.  Safety protocols were in place, including screening questions prior to the visit, additional usage of staff PPE, and extensive cleaning of exam room while observing appropriate contact time as indicated for disinfecting solutions.   I have spent 30 minutes in the care of this patient regarding history taking, discussion of medication and treatment options for osteoporosis, discussion of appropriate calcium intake as well as vitamin D intake, review of patient's recent DEXA scan, discussion of plan.   Tommi Rumps, MD [place]

## 2021-03-15 NOTE — Assessment & Plan Note (Addendum)
Discussed Prolia and zoledronic acid as treatment options.  She declines treatment at this time.  She is concerned about side effects given her history of issues with Enbrel.  She will work on weightbearing exercises.  Her vitamin D is adequately regulated on 2000 international units/day.  Discussed 1200 mg of calcium in her diet and if needed she could add on a calcium supplement. She is considering algaecal for calcium supplementation.  It appears this just has calcium, magnesium, and vitamin D3.  I encouraged her to be careful with supplements.

## 2021-03-15 NOTE — Patient Instructions (Signed)
Nice to see you. Please try to get adequate calcium in her diet. Please be careful with taking any supplements as they could have other things in them. If you develop worsening tingling, numbness or any weakness you need to let us know right away.

## 2021-03-15 NOTE — Assessment & Plan Note (Signed)
The patient seems to have improved significantly.  I encouraged continued activity.  Discussed the possibility of gabapentin and Lyrica for her tingling though she declines this as her symptoms are very mild.  She will let us know if she has any worsening symptoms.  If she has ascending tingling, numbness, or if she starts to develop weakness she will contact us and neurology immediately.

## 2021-04-08 ENCOUNTER — Other Ambulatory Visit: Payer: Self-pay

## 2021-04-08 ENCOUNTER — Ambulatory Visit
Admission: RE | Admit: 2021-04-08 | Discharge: 2021-04-08 | Disposition: A | Discharge status: Home / Self Care | Payer: Medicare Other | Source: Ambulatory Visit | Attending: Family Medicine | Admitting: Family Medicine

## 2021-04-08 DIAGNOSIS — K824 Cholesterolosis of gallbladder: Secondary | ICD-10-CM | POA: Insufficient documentation

## 2021-04-08 DIAGNOSIS — Z20822 Contact with and (suspected) exposure to covid-19: Secondary | ICD-10-CM | POA: Diagnosis not present

## 2021-04-13 ENCOUNTER — Ambulatory Visit (INDEPENDENT_AMBULATORY_CARE_PROVIDER_SITE_OTHER): Payer: Medicare Other

## 2021-04-13 DIAGNOSIS — I441 Atrioventricular block, second degree: Secondary | ICD-10-CM | POA: Diagnosis not present

## 2021-04-13 LAB — CUP PACEART REMOTE DEVICE CHECK
Battery Remaining Longevity: 102 mo
Battery Remaining Percentage: 80 %
Battery Voltage: 3.01 V
Brady Statistic AP VP Percent: 3.3 %
Brady Statistic AP VS Percent: 8.6 %
Brady Statistic AS VP Percent: 24 %
Brady Statistic AS VS Percent: 60 %
Brady Statistic RA Percent Paced: 6.6 %
Brady Statistic RV Percent Paced: 27 %
Date Time Interrogation Session: 20220713034935
Implantable Lead Implant Date: 20191022
Implantable Lead Implant Date: 20191022
Implantable Lead Location: 753859
Implantable Lead Location: 753860
Implantable Pulse Generator Implant Date: 20191022
Lead Channel Impedance Value: 580 Ohm
Lead Channel Impedance Value: 710 Ohm
Lead Channel Pacing Threshold Amplitude: 0.5 V
Lead Channel Pacing Threshold Amplitude: 0.75 V
Lead Channel Pacing Threshold Pulse Width: 0.5 ms
Lead Channel Pacing Threshold Pulse Width: 0.5 ms
Lead Channel Sensing Intrinsic Amplitude: 12 mV
Lead Channel Sensing Intrinsic Amplitude: 5 mV
Lead Channel Setting Pacing Amplitude: 1 V
Lead Channel Setting Pacing Amplitude: 2 V
Lead Channel Setting Pacing Pulse Width: 0.5 ms
Lead Channel Setting Sensing Sensitivity: 2 mV
Pulse Gen Model: 2272
Pulse Gen Serial Number: 9073848

## 2021-04-14 ENCOUNTER — Ambulatory Visit
Admission: EM | Admit: 2021-04-14 | Discharge: 2021-04-14 | Disposition: A | Discharge status: Home / Self Care | Payer: Medicare Other | Attending: Emergency Medicine | Admitting: Emergency Medicine

## 2021-04-14 ENCOUNTER — Other Ambulatory Visit: Payer: Self-pay

## 2021-04-14 DIAGNOSIS — L0231 Cutaneous abscess of buttock: Secondary | ICD-10-CM

## 2021-04-14 HISTORY — DX: Guillain-Barre syndrome: G61.0

## 2021-04-14 MED ORDER — CEPHALEXIN 500 MG PO CAPS: 500.0000 mg | ORAL_CAPSULE | Freq: Two times a day (BID) | ORAL | 0 refills | Status: AC

## 2021-04-14 NOTE — ED Provider Notes (Signed)
Chief Complaint  Patient presents with   Abscess     Subjective/HPI:  Alison Huffman is a very pleasant 80 y.o. female who presents with concerns for abscess to left buttock.  Patient states that she noticed a tender "lump" yesterday which has now started draining a small amount of yellow discharge.  Patient reports TTP.  Patient reports that she has used warm compresses which have seemed to help some.  She denies any fever, chills, vomiting.  She also does not report any perineal pain.  ROS: General/Constitutional: No fevers, chills, or night sweats Skin: See HPI Musculoskeletal: No weakness, myalgias, arthralgias, joint swelling, locking popping or limitations in joint range of motion Peripheral Vascular: No edema, numbness, discoloration, pain, or paresthesias Lymph: No swelling, red streaks or swollen lymph nodes  OBJECTIVE:  Vitals:   04/14/21 0856  BP: (!) 144/75  Pulse: 76  Resp: 18  Temp: 98.2 F (36.8 C)  SpO2: 96%    General:   Alert and oriented, in no acute distress  Lesion Description: Small abscess with central area of fluctuance noted to inner aspect of left buttock measuring approximately 0.5 cm x 0.5 cm.  Mild TTP over area.  Currently draining. Lymph:   No lymphadenopathy, red streaking, redness, pain or swelling noted Musculoskeletal:   No involvement of deep tissue, tendon or bony deformity, full range of motion, strength normal Extremities:   No edema, cyanosis Vascular: Normal perfusion, no arterial or venous injury Neurological: Full range of motion, sensation intact, reflexes normal, strength normal  PROCEDURE NOTE:  None completed.  Area is currently draining.   Tetanus/Tdap.  Not indicated   Assessment:   1. Abscess of left buttock  Meds ordered this encounter  Medications   cephALEXin (KEFLEX) 500 MG capsule    Sig: Take 1 capsule (500 mg total) by mouth 2 (two) times daily for 7 days.    Dispense:  14 capsule    Refill:  0    Order  Specific Question:   Supervising Provider    Answer:   Chase Picket [id number]    Plan:      MDM: Patient presents with concerns for abscess to left buttock.  Patient states that she noticed a tender "lump" yesterday which has now started draining a small amount of yellow discharge.  Patient reports TTP.  Patient reports that she has used warm compresses which have seemed to help some.  She denies any fever, chills, vomiting.  She also does not report any perineal pain.  Chart review completed.  Given symptoms along with assessment findings, likely abscess.  Area is currently draining, will start on Keflex.  Advised of strict return precautions for worsening of symptoms.  Stable on discharge.  Patient verbalized understanding and agreed with plan.    Discharge Instructions      Take antibiotic with food as prescribed. Apply warm compresses to promote drainage. Keep covered until wound closes. Expect it to gradually improve over 7 - 10 days.  Watch for signs of worsening infection: increased redness, swelling, red streaking, fever, or worsening pain. If these symptoms are present, or if you have new or concerning symptoms, return to clinic immediately for a recheck.         Serafina Royals, FNP-C 04/14/21  This note was partially made with the aid of speech-to-text dictation; typographical errors are not intentional.    Serafina Royals, [place] 04/14/21 [phone]

## 2021-04-14 NOTE — ED Triage Notes (Signed)
Pt presents for abscess near anus first time noticed was yesterday. TTP and slight amount of light yellow drainage.  Warm compresses applied.

## 2021-04-14 NOTE — Discharge Instructions (Addendum)
Take antibiotic with food as prescribed. Apply warm compresses to promote drainage. Keep covered until wound closes. Expect it to gradually improve over 7 - 10 days.  Watch for signs of worsening infection: increased redness, swelling, red streaking, fever, or worsening pain. If these symptoms are present, or if you have new or concerning symptoms, return to clinic immediately for a recheck.

## 2021-04-25 ENCOUNTER — Other Ambulatory Visit: Payer: Self-pay

## 2021-04-25 ENCOUNTER — Encounter
Payer: Medicare Other | Attending: Physical Medicine and Rehabilitation | Admitting: Physical Medicine and Rehabilitation

## 2021-04-25 ENCOUNTER — Encounter: Payer: Self-pay | Admitting: Physical Medicine and Rehabilitation

## 2021-04-25 VITALS — BP 155/73 | HR 65 | Temp 98.0°F | Ht 66.0 in | Wt 177.8 lb

## 2021-04-25 DIAGNOSIS — M6281 Muscle weakness (generalized): Secondary | ICD-10-CM | POA: Diagnosis not present

## 2021-04-25 DIAGNOSIS — G610 Guillain-Barre syndrome: Secondary | ICD-10-CM | POA: Insufficient documentation

## 2021-04-25 DIAGNOSIS — R269 Unspecified abnormalities of gait and mobility: Secondary | ICD-10-CM | POA: Diagnosis not present

## 2021-04-25 NOTE — Progress Notes (Signed)
Subjective:    Patient ID: Alison Huffman, female    DOB: 07/26/1941, 80 y.o.   MRN: [id number]  HPI  Patient is an 80 yr old female with Guillain Barre syndrome-  Was dx'd in June/July 2021 (stopped breathing July 1st 2021)- hx of OSA, dCHF, chest tubes due to B/L pneumothorax due to Code blue/being bagged, seizure, hyper and hyponatremia- acute; S/P IVIG and plasmapharesis. As well as R vocal cord paralysis. Now eating regular diet! And dysphagia has resolved.  Hand arthritis.  Here for f/u on Guillain Barre syndrome.   Still SOB- cannot talk and walk at the same time and cannot go fast.  1 month ago, tried walking with cane, not Rollator (rollator usually uses outside, not inside).  Harder to walk with single point cane and nothing than with rollator- can walk ~ 1 mile max, but hasn't walked outside in 1 month due to heat.   Still has Numbness/tingling.     Had U/S of liver/gallbladder-  Sludge; has small sludge ball- but otherwise looks well.   Has T score of -1.8- doesn't want to take Fosamax, or anything else- just trying to take Calcium and Vit D. Doesn't want side effects.  Has tried to get rid of things in home that could cause falls- uses rollator outside of shower and sits to shower- got rid of rolling chair, but no throw rugs or anything- PT went through house for her and helped her figure out what to get rid of.      Pain Inventory Average Pain 1 Pain Right Now 1 My pain is tingling  In the last 24 hours, has pain interfered with the following? General activity 0 Relation with others 0 Enjoyment of life 0 What TIME of day is your pain at its worst? night Sleep (in general) Good  Pain is worse with: unsure Pain improves with:  numbness and tingling only Relief from Meds:  no meds  Family History  Problem Relation Age of Onset   Stroke Mother    Atrial fibrillation Mother    Breast cancer Mother    Stroke Father    Breast cancer Maternal Aunt    Breast  cancer Paternal Aunt    Social History   Socioeconomic History   Marital status: Single    Spouse name: Not on file   Number of children: Not on file   Years of education: Not on file   Highest education level: Not on file  Occupational History   Not on file  Tobacco Use   Smoking status: Never   Smokeless tobacco: Never   Tobacco comments:    07/22/2018 "smoked some in college; 1960s;  nothing since"  Vaping Use   Vaping Use: Never used  Substance and Sexual Activity   Alcohol use: Yes    Comment: rarely   Drug use: Never   Sexual activity: Not Currently    Birth control/protection: Post-menopausal  Other Topics Concern   Not on file  Social History Narrative   Right Handed   Lives in an apartment. 5th floor but has elevator    Drinks Caffeine.    Social Determinants of Health   Financial Resource Strain: Low Risk    Difficulty of Paying Living Expenses: Not hard at all  Food Insecurity: No Food Insecurity   Worried About Charity fundraiser in the Last Year: Never true   Ran Out of Food in the Last Year: Never true  Transportation Needs: No Transportation Needs  Lack of Transportation (Medical): No   Lack of Transportation (Non-Medical): No  Physical Activity: Sufficiently Active   Days of Exercise per Week: 4 days   Minutes of Exercise per Session: 60 min  Stress: No Stress Concern Present   Feeling of Stress : Not at all  Social Connections: Not on file   Past Surgical History:  Procedure Laterality Date   APPENDECTOMY     BREAST CYST ASPIRATION Bilateral    BREAST CYST EXCISION Right 1978   benign   COLONOSCOPY     COLONOSCOPY WITH PROPOFOL N/A 07/10/2019   Procedure: COLONOSCOPY WITH PROPOFOL;  Surgeon: Lollie Sails, MD;  Location: [facility] ENDOSCOPY;  Service: Endoscopy;  Laterality: N/A;   ESOPHAGOGASTRODUODENOSCOPY (EGD) WITH PROPOFOL N/A 07/10/2019   Procedure: ESOPHAGOGASTRODUODENOSCOPY (EGD) WITH PROPOFOL;  Surgeon: Lollie Sails, MD;   Location: [facility] ENDOSCOPY;  Service: Endoscopy;  Laterality: N/A;   HYSTEROSCOPY WITH D & C N/A 11/05/2018   Procedure: DILATATION AND CURETTAGE /HYSTEROSCOPY;  Surgeon: Homero Fellers, MD;  Location: [facility] ORS;  Service: Gynecology;  Laterality: N/A;   INSERT / REPLACE / REMOVE PACEMAKER     PACEMAKER IMPLANT N/A 07/23/2018   SJM Assurity [id number] implanted by Dr Rayann Heman for mobitz II second degree AV block   UTERINE POLYPS REMOVAL     Past Surgical History:  Procedure Laterality Date   APPENDECTOMY     BREAST CYST ASPIRATION Bilateral    BREAST CYST EXCISION Right 1978   benign   COLONOSCOPY     COLONOSCOPY WITH PROPOFOL N/A 07/10/2019   Procedure: COLONOSCOPY WITH PROPOFOL;  Surgeon: Lollie Sails, MD;  Location: [facility] ENDOSCOPY;  Service: Endoscopy;  Laterality: N/A;   ESOPHAGOGASTRODUODENOSCOPY (EGD) WITH PROPOFOL N/A 07/10/2019   Procedure: ESOPHAGOGASTRODUODENOSCOPY (EGD) WITH PROPOFOL;  Surgeon: Lollie Sails, MD;  Location: [facility] ENDOSCOPY;  Service: Endoscopy;  Laterality: N/A;   HYSTEROSCOPY WITH D & C N/A 11/05/2018   Procedure: DILATATION AND CURETTAGE /HYSTEROSCOPY;  Surgeon: Homero Fellers, MD;  Location: [facility] ORS;  Service: Gynecology;  Laterality: N/A;   INSERT / REPLACE / REMOVE PACEMAKER     PACEMAKER IMPLANT N/A 07/23/2018   SJM Assurity [id number] implanted by Dr Rayann Heman for mobitz II second degree AV block   UTERINE POLYPS REMOVAL     Past Medical History:  Diagnosis Date   Ankle pain, right    Atypical chest pain    a. 10/2018 MV: EF 57%. No ischemia/infarct.   Coronary artery disease    Diastolic dysfunction    a. 04/2018 Echo: EF 55-60%, no rwma, Gr1 DD. Triv AI, mild MR. Mod dil LA. Nl RV fxn. PASP nl.   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 07/2018 s/p SJM Assurity MRI model [id number] (Ser# [id number]).   Sleep apnea    "using nasal pilllows since ~ 12/2017" (  07/22/2018)   TMJ (dislocation of temporomandibular joint)    BP (!) 155/73   Pulse 65   Temp 98 F (36.7 C)   Ht '5\' 6"'$  (1.676 m)   Wt 177 lb 12.8 oz (80.6 kg)   SpO2 97%   BMI 28.70 kg/m   Opioid Risk Score:   Fall Risk Score:  `1  Depression screen PHQ 2/9  Depression screen [facility] 2/9 04/25/2021 01/10/2021 09/21/2020 07/26/2020 06/21/2020 05/28/2020 03/24/2020  Decreased Interest 0 0 0 0 0 0 0  Down, Depressed, Hopeless 0 0 0 0 0 0 0  PHQ - 2 Score 0 0 0 0 0 0 0  Altered sleeping - - - - - 1 -  Tired, decreased energy - - - - - 1 -  Change in appetite - - - - - 0 -  Feeling bad or failure about yourself  - - - - - 0 -  Trouble concentrating - - - - - 0 -  Moving slowly or fidgety/restless - - - - - 0 -  Suicidal thoughts - - - - - 0 -  PHQ-9 Score - - - - - 2 -  Difficult doing work/chores - - - - - Not difficult at all -  Some recent data might be hidden     Review of Systems  Constitutional: Negative.   HENT: Negative.    Eyes: Negative.   Respiratory: Negative.    Cardiovascular: Negative.   Gastrointestinal: Negative.   Endocrine: Negative.   Genitourinary: Negative.   Musculoskeletal: Negative.   Skin: Negative.   Neurological:  Positive for numbness.       Tingling  Hematological: Negative.   Psychiatric/Behavioral: Negative.    All other systems reviewed and are negative.     Objective:   Physical Exam  Awake, alert, appropriate, no assistive device today, NAD Toes/feet slightly purple- slightly cooler, but not severe MS: 5/5  in UE B/L- in biceps, triceps, WE, grip and finger abd B/L LE- HF 4+/5, KE/KF, DF/PF 5/5 B/L  Neuro: Decreased to light touch in ankles and feet B/L- very slightly less intermittently in lower calves, however otherwise completely intact in UE B/L and thighs/knees and upper calves B/L  DTR's slightly delayed but 2+ at patella B/L, but still absent to trace at achilles B/L.         Assessment & Plan:   Patient is an 80 yr old female with Guillain Barre syndrome-  Was dx'd in June/July 2021 (stopped breathing July 1st 2021)- hx of OSA, dCHF, chest tubes due to B/L pneumothorax due to Code blue/being bagged, seizure, hyper and hyponatremia- acute; S/P IVIG and plasmapharesis. As well as R vocal cord paralysis. Now eating regular diet! And dysphagia has resolved.  Hand arthritis.  Has been 1 year since Guillain Barre-   To improve hip flexion-  Do recumbent bike 4-5x/week. Start at At least 15 minutes- work up to 22-25 minutes daily. Low tension setting.  Water aerobics/walking.  Weights- leg presses are the best- for weights to do- hold at 45 degrees- for 1-3 seconds- start with 10 reps- 1 set of 10 reps- then work up to higher level.  Actually weight leg presses/and recumbent bike together would be best. Half squat on days don't go to gym at apartment- set of 10 reps- both leg initially and can work up to 3 sets of 10 before you go to 1 leg- and then 10 reps /1 set of 1 leg and can work up.  2. N/T is so mild/tingling- is so mild- so doesn't want nerve pain meds.   3. For osteoporosis /fall risk- discussed what to do to reduce fracture risk- has already gone through home with PT to get rid of increased fall risks and use rollator outside the home and with shower.   4. Has life alert that wears on campus- to decrease risk of being stuck on floor.   5. F/U in 6 months- and if doing well; than don't need f/u appointment-   I spent a total of 23 minutes going over visit- and demonstrating  exercises and how to do/had pt also do them as well.

## 2021-04-25 NOTE — Patient Instructions (Addendum)
Patient is an 80 yr old female with Guillain Barre syndrome-  Was dx'd in June/July 2021 (stopped breathing July 1st 2021)- hx of OSA, dCHF, chest tubes due to B/L pneumothorax due to Code blue/being bagged, seizure, hyper and hyponatremia- acute; S/P IVIG and plasmapharesis. As well as R vocal cord paralysis. Now eating regular diet! And dysphagia has resolved.  Hand arthritis.  Has been 1 year since Guillain Barre-   To improve hip flexion-  Do recumbent bike 4-5x/week. Start at At least 15 minutes- work up to 22-25 minutes daily. Low tension setting. Heart rate 100-110- don't go above 120 regularly.  Water aerobics/walking.  Weights- leg presses are the best- for weights to do- hold at 45 degrees- for 1-3 seconds- start with 10 reps- 1 set of 10 reps- then work up to higher level.  Actually weight leg presses/and recumbent bike together would be best. Half squat on days don't go to gym at apartment- set of 10 reps- both leg initially and can work up to 3 sets of 10 before you go to 1 leg- and then 10 reps /1 set of 1 leg and can work up.  2. N/T is so mild/tingling- is so mild- so doesn't want nerve pain meds.   3. For osteoporosis /fall risk- discussed what to do to reduce fracture risk- has already gone through home with PT to get rid of increased fall risks and use rollator outside the home and with shower.   4. Has life alert that wears on campus- to decrease risk of being stuck on floor.   5. F/U in 6 months- and if doing well; than don't need f/u appointment-

## 2021-05-04 ENCOUNTER — Telehealth: Payer: Self-pay

## 2021-05-04 NOTE — Telephone Encounter (Signed)
Pt calling; was seen in Feb and told to f/u in 71m needs f/u appt scheduled as well as u/s and labs.  [phone]

## 2021-05-06 ENCOUNTER — Other Ambulatory Visit: Payer: Self-pay | Admitting: Obstetrics and Gynecology

## 2021-05-06 DIAGNOSIS — R9389 Abnormal findings on diagnostic imaging of other specified body structures: Secondary | ICD-10-CM

## 2021-05-06 DIAGNOSIS — N83202 Unspecified ovarian cyst, left side: Secondary | ICD-10-CM

## 2021-05-06 DIAGNOSIS — N83299 Other ovarian cyst, unspecified side: Secondary | ICD-10-CM

## 2021-05-06 NOTE — Telephone Encounter (Signed)
Thur secure chat orders are in will contact patient with scheduled gyn u/s and f/u

## 2021-05-06 NOTE — Progress Notes (Signed)
Remote pacemaker transmission.   

## 2021-05-11 ENCOUNTER — Other Ambulatory Visit: Payer: Self-pay

## 2021-05-11 ENCOUNTER — Other Ambulatory Visit: Payer: TRICARE For Life (TFL)

## 2021-05-11 ENCOUNTER — Other Ambulatory Visit: Payer: Medicare Other

## 2021-05-11 DIAGNOSIS — N83299 Other ovarian cyst, unspecified side: Secondary | ICD-10-CM

## 2021-05-11 DIAGNOSIS — N83202 Unspecified ovarian cyst, left side: Secondary | ICD-10-CM

## 2021-05-12 LAB — CA 125: Cancer Antigen (CA) 125: 7 U/mL (ref 0.0–38.1)

## 2021-05-13 ENCOUNTER — Other Ambulatory Visit: Payer: Self-pay

## 2021-05-13 ENCOUNTER — Ambulatory Visit (INDEPENDENT_AMBULATORY_CARE_PROVIDER_SITE_OTHER): Payer: Medicare Other | Admitting: Family Medicine

## 2021-05-13 VITALS — BP 130/74 | HR 80 | Temp 97.9°F | Resp 16 | Ht 66.0 in | Wt 177.8 lb

## 2021-05-13 DIAGNOSIS — R0609 Other forms of dyspnea: Secondary | ICD-10-CM

## 2021-05-13 DIAGNOSIS — I441 Atrioventricular block, second degree: Secondary | ICD-10-CM

## 2021-05-13 DIAGNOSIS — L900 Lichen sclerosus et atrophicus: Secondary | ICD-10-CM

## 2021-05-13 DIAGNOSIS — I10 Essential (primary) hypertension: Secondary | ICD-10-CM | POA: Diagnosis not present

## 2021-05-13 DIAGNOSIS — K824 Cholesterolosis of gallbladder: Secondary | ICD-10-CM | POA: Diagnosis not present

## 2021-05-13 DIAGNOSIS — R0600 Dyspnea, unspecified: Secondary | ICD-10-CM | POA: Diagnosis not present

## 2021-05-13 DIAGNOSIS — G610 Guillain-Barre syndrome: Secondary | ICD-10-CM

## 2021-05-13 NOTE — Patient Instructions (Signed)
Nice to see you. Somebody from Dr. Donivan Scull office should call you to schedule an appointment.

## 2021-05-13 NOTE — Assessment & Plan Note (Signed)
Adequate control for age.  She will continue losartan 25 mg daily.  She will return in 3 months for follow-up and labs.

## 2021-05-13 NOTE — Assessment & Plan Note (Signed)
This is an ongoing issue.  This may represent deconditioning from her Guillain-Barr syndrome and the issues she had with her lungs in the hospital.  I do think it is reasonable to have her see cardiology to rule out an underlying cardiac contributing factor.  Referral to cardiology placed.

## 2021-05-13 NOTE — Assessment & Plan Note (Signed)
She reports this is stable.  She will continue her clobetasol.  She will continue to see dermatology.

## 2021-05-13 NOTE — Assessment & Plan Note (Signed)
Found to be a sludge ball.  No further follow-up needed.

## 2021-05-13 NOTE — Progress Notes (Signed)
Tommi Rumps, MD Phone: [phone]  Alison Huffman is a 80 y.o. female who presents today for follow-up.  Guillain-Barr syndrome: Patient notes she is doing quite well now.  She notes some continued hip flexor weakness and the physical medicine and rehab physician gave her some exercises to do.  She is not having to use a cane as much.  Overall doing quite well.  Hypertension: Typically [id number] systolic blood pressures.  Rarely goes above into the 150s.  She is taking losartan.  No chest pain.  Occasional edema left greater than right.  She does report some chronic dyspnea on exertion that started around the time of her Guillain-Barr diagnosis.  Notes she gets dyspneic if she walks and talks or talks too quickly.  She notes pulmonology advised that she had deconditioned lungs.  She notes no orthopnea or PND.  Recent BNP and CBC were unremarkable.  Lichen sclerosus: Patient uses clobetasol 1 time a week.  This does a good job controlling her rash.  She follows with dermatology.  Second-degree type II AV block: She has a pacemaker in place.  She follows with electrophysiology in [place].  She notes no palpitations.  Does report she was advised she had some PVCs so they did not want to start on medication unless she became symptomatic.  Social History   Tobacco Use  Smoking Status Never  Smokeless Tobacco Never  Tobacco Comments   07/22/2018 "smoked some in college; 1960s;  nothing since"    Current Outpatient Medications on File Prior to Visit  Medication Sig Dispense Refill   acetaminophen (TYLENOL) 325 MG tablet Take 325-650 mg by mouth every 6 (six) hours as needed for mild pain or headache.      clobetasol ointment (TEMOVATE) 0.05 %      famotidine (PEPCID) 20 MG tablet Take 1 tablet by mouth 2 (two) times daily.     hydrocerin (EUCERIN) CREA Apply 1 application topically 2 (two) times daily. To bilateral feet.  0   ketoconazole (NIZORAL) 2 % cream Apply 1 application  topically daily. 15 g 0   losartan (COZAAR) 25 MG tablet Take 1 tablet (25 mg total) by mouth daily. 90 tablet 3   methylcellulose (CITRUCEL) oral powder      polyethylene glycol (MIRALAX / GLYCOLAX) 17 g packet Take 17 g by mouth daily as needed for severe constipation. 14 each 0   tacrolimus (PROTOPIC) 0.1 % ointment Apply topically 2 (two) times daily as needed (for psoriasis).      Vitamin D, Ergocalciferol, 50 MCG (2000 UT) CAPS Take by mouth.     No current facility-administered medications on file prior to visit.     ROS see history of present illness  Objective  Physical Exam Vitals:   05/13/21 0931  BP: 130/74  Pulse: 80  Resp: 16  Temp: 97.9 F (36.6 C)  SpO2: 98%    BP Readings from Last 3 Encounters:  05/13/21 130/74  04/25/21 (!) 155/73  04/14/21 (!) 144/75   Wt Readings from Last 3 Encounters:  05/13/21 177 lb 12.8 oz (80.6 kg)  04/25/21 177 lb 12.8 oz (80.6 kg)  03/15/21 175 lb 12.8 oz (79.7 kg)    Physical Exam Constitutional:      General: She is not in acute distress.    Appearance: She is not diaphoretic.  Cardiovascular:     Rate and Rhythm: Normal rate and regular rhythm.     Heart sounds: Normal heart sounds.  Pulmonary:  Effort: Pulmonary effort is normal.     Breath sounds: Normal breath sounds.  Musculoskeletal:     Comments: Trace edema bilateral lower extremities to the lower shin  Skin:    General: Skin is warm and dry.  Neurological:     Mental Status: She is alert.     Assessment/Plan: Please see individual problem list.  Problem List Items Addressed This Visit     Dyspnea on exertion - Primary    This is an ongoing issue.  This may represent deconditioning from her Guillain-Barr syndrome and the issues she had with her lungs in the hospital.  I do think it is reasonable to have her see cardiology to rule out an underlying cardiac contributing factor.  Referral to cardiology placed.      Relevant Orders   Ambulatory  referral to Cardiology   Gallbladder polyp    Found to be a sludge ball.  No further follow-up needed.      Guillain Barr syndrome ([place])    Generally significantly improved from where she was initially.  She will continue her exercises.  She will monitor.  She will continue to see her specialists.      Hypertension    Adequate control for age.  She will continue losartan 25 mg daily.  She will return in 3 months for follow-up and labs.      Lichen sclerosus    She reports this is stable.  She will continue her clobetasol.  She will continue to see dermatology.      Second degree AV block, Mobitz type II    Status post pacemaker placement.  She is overall doing well.  She will continue to follow with electrophysiology.       Return in about 3 months (around 08/13/2021) for Hypertension with labs.  This visit occurred during the SARS-CoV-2 public health emergency.  Safety protocols were in place, including screening questions prior to the visit, additional usage of staff PPE, and extensive cleaning of exam room while observing appropriate contact time as indicated for disinfecting solutions.    Tommi Rumps, MD [place]

## 2021-05-13 NOTE — Assessment & Plan Note (Signed)
Status post pacemaker placement.  She is overall doing well.  She will continue to follow with electrophysiology.

## 2021-05-13 NOTE — Assessment & Plan Note (Signed)
Generally significantly improved from where she was initially.  She will continue her exercises.  She will monitor.  She will continue to see her specialists.

## 2021-05-16 ENCOUNTER — Ambulatory Visit
Admission: RE | Admit: 2021-05-16 | Discharge: 2021-05-16 | Disposition: A | Discharge status: Home / Self Care | Payer: Medicare Other | Source: Ambulatory Visit | Attending: Obstetrics and Gynecology | Admitting: Obstetrics and Gynecology

## 2021-05-16 ENCOUNTER — Other Ambulatory Visit: Payer: Self-pay | Admitting: Family Medicine

## 2021-05-16 ENCOUNTER — Other Ambulatory Visit: Payer: Self-pay

## 2021-05-16 DIAGNOSIS — N83202 Unspecified ovarian cyst, left side: Secondary | ICD-10-CM | POA: Insufficient documentation

## 2021-05-16 DIAGNOSIS — N83299 Other ovarian cyst, unspecified side: Secondary | ICD-10-CM | POA: Diagnosis not present

## 2021-05-16 DIAGNOSIS — Z780 Asymptomatic menopausal state: Secondary | ICD-10-CM | POA: Diagnosis not present

## 2021-05-16 DIAGNOSIS — Z1231 Encounter for screening mammogram for malignant neoplasm of breast: Secondary | ICD-10-CM

## 2021-05-18 ENCOUNTER — Other Ambulatory Visit: Payer: Self-pay

## 2021-05-18 ENCOUNTER — Ambulatory Visit (INDEPENDENT_AMBULATORY_CARE_PROVIDER_SITE_OTHER): Payer: Medicare Other | Admitting: Neurology

## 2021-05-18 ENCOUNTER — Telehealth: Payer: Self-pay | Admitting: Family Medicine

## 2021-05-18 ENCOUNTER — Encounter: Payer: Self-pay | Admitting: Neurology

## 2021-05-18 VITALS — BP 147/79 | HR 65 | Ht 65.0 in | Wt 179.0 lb

## 2021-05-18 DIAGNOSIS — G610 Guillain-Barre syndrome: Secondary | ICD-10-CM | POA: Diagnosis not present

## 2021-05-18 DIAGNOSIS — I10 Essential (primary) hypertension: Secondary | ICD-10-CM

## 2021-05-18 MED ORDER — LOSARTAN POTASSIUM 25 MG PO TABS: 25.0000 mg | ORAL_TABLET | Freq: Every day | ORAL | 3 refills | Status: DC

## 2021-05-18 NOTE — Telephone Encounter (Signed)
PT called to advise that she is changing her pharmacy to [facility] and needs a refill of their losartan (COZAAR) 25 MG tablet called into [facility]

## 2021-05-18 NOTE — Telephone Encounter (Signed)
Medication has been reordered for [facility].Called [facility] pharmacy and spoke to [place]. Ebony Hail cancelled remaining refills of Losartan at [facility].

## 2021-05-18 NOTE — Progress Notes (Signed)
Follow-up Visit   Date: 05/18/21   Alison Huffman MRN: [id number] DOB: 08/11/1941   Interim History: Alison Huffman is a 80 y.o. right-handed Caucasian female with cardiac arrythmia s/p pacemaker, psoratic arthritis, hypertension, OSA, GERD, and hyperlipidemia returning to the clinic for follow-up of Guillain Barre Syndrome.  The patient was accompanied to the clinic by self.  History of present illness: She presented to [facility] in June and transferred to [facility] with ascending paresthesias, proximal weakness (3/5 hip flexion, 3/5 shoulder extension), dysphagia, and respiratory failure about a week following treatment with Enbrel. She had a complex hospital course with  hyponatremia-induced (Na 119) seizure, respiratory failure requiring resuscitation, bilateral pneumothorax, and right vocal cord paralysis.  Work-up included CSF testing which did not show albuminocytologic dissociation, but given high clinical suspicion was treated for Guillain Barre syndrome with IVIG but did not respond.  She responded to plasmapheresis x 7 session. MRI brain, cervical spine, and thoracic spine was unremarkable.  She slowly improved and discharged to rehab then SNF, where she continued to make improvements.  She is now walking with a rollator/cane and able to walk a few steps unassisted.  No dysarthria, dysphagia, double vision, or limb weakness.  She still have numbness/tingling in the lower legs/feet. She is now walking with a rollator and able to take steps unassisted.  No falls.   UPDATE 12/03/2020:  She is here for follow-up visit.  She has NCS/EMG in December which was consistent with chronic demyelinating and axonal neuropathy.  She has been very compliant with PT/OT and made significant improvement.  She is no longer using a walker and able to walk unassisted at home, and uses a cone for long distances.  Numbness remains in the distal feet. No new complaints  UPDATE  05/18/2021:  She is here for 6 month visit.  She has been doing great from a neurological stand point.  She only is aware of numbness/tingling in the toes when she flexes her toes.   Most of the time, she is unaware. No new weakness or falls.  She walks unassisted. She tries to be compliant with her PT exercises.  She continues to have shortness of breath and will be seeing cardiology.  She received COVID booster in the Spring and tolerated it well.   Medications:  Current Outpatient Medications on File Prior to Visit  Medication Sig Dispense Refill   acetaminophen (TYLENOL) 325 MG tablet Take 325-650 mg by mouth every 6 (six) hours as needed for mild pain or headache.      clobetasol ointment (TEMOVATE) 0.05 %      famotidine (PEPCID) 20 MG tablet Take 1 tablet by mouth 2 (two) times daily.     hydrocerin (EUCERIN) CREA Apply 1 application topically 2 (two) times daily. To bilateral feet.  0   ketoconazole (NIZORAL) 2 % cream Apply 1 application topically daily. 15 g 0   losartan (COZAAR) 25 MG tablet Take 1 tablet (25 mg total) by mouth daily. 90 tablet 3   methylcellulose (CITRUCEL) oral powder      polyethylene glycol (MIRALAX / GLYCOLAX) 17 g packet Take 17 g by mouth daily as needed for severe constipation. 14 each 0   tacrolimus (PROTOPIC) 0.1 % ointment Apply topically 2 (two) times daily as needed (for psoriasis).      Vitamin D, Ergocalciferol, 50 MCG (2000 UT) CAPS Take by mouth.     No current facility-administered medications on file prior to visit.  Allergies:  Allergies  Allergen Reactions   Etanercept     Guillain Baree   Nickel     Vital Signs:  BP (!) 147/79   Pulse 65   Ht '5\' 5"'$  (1.651 m)   Wt 179 lb (81.2 kg)   SpO2 98%   BMI 29.79 kg/m   Neurological Exam: MENTAL STATUS including orientation to time, place, person, recent and remote memory, attention span and concentration, language, and fund of knowledge is normal.  Speech is not dysarthric, mild  hoarseness.  CRANIAL NERVES:  No visual field defects.  Pupils equal round and reactive to light.  Normal conjugate, extra-ocular eye movements in all directions of gaze.  No ptosis.  Face is symmetric. Palate elevates symmetrically.  Tongue is midline.  MOTOR:  Motor strength is 5/5 in all extremities, including distally.  No atrophy, fasciculations or abnormal movements.  No pronator drift.  Tone is normal.    MSRs:                                           Right        Left brachioradialis 2+  2+  biceps 2+  2+  triceps 2+  2+  patellar 1+  2+  ankle jerk 0  0   SENSORY:  Intact to vibration throughout, including ankles bilaterally.  Temperature intact throughout.   COORDINATION/GAIT:    Gait narrow based and stable.  Mild unsteadiness with tandem and stressed gait., but able to perform.  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  Initially treated with IVIG (no response) and then plasmapheresis.  She responded extremely well to plasmapheresis.  Marked improvement with only intermittent distal paresthesias and arreflexia at the ankles.  Continue home exercises Vaccine safety discussed, she may get vaccinations as needed  2.  Right vocal cord paralysis, secondary to intubation.   Return to clinic in 1 year  Total time spent reviewing records, interview, history/exam, documentation, and coordination of care on day of encounter:  20 min   Thank you for allowing me to participate in patient's care.  If I can answer any additional questions, I would be pleased to do so.    Sincerely,    Starsky Nanna K. Posey Pronto, DO

## 2021-05-22 NOTE — Progress Notes (Signed)
Cardiology Office Note  Date:  05/23/2021   ID:  Alison, Huffman [birth year], MRN [id number]  PCP:  Leone Haven, MD   Chief Complaint  Patient presents with   Shortness of Breath    Patient c/o shortness of breath on exertion and LE edema. Medications reviewed by the patient verbally.     HPI:  [name] is a 80 year old woman with past medical history of Hyperlipidemia Hypertension Psoriatic arthritis on methotrexate, "better" OSA, on CPAP Heart block, pacer Who presents for chronic shortness of breath on exertion  Last seen by myself in clinic July 2020 Recent issues with Guillain-Barr syndrome "On venilator 9 days" In er 03/2021, numbness In hospital 03/27/2021:   1 week after receiving her Enbrel shot patient developed numbness and tingling in her lower extremities. Transferred to [facility] for MRI  Required intubation for airway protection and transferred to ICU. 7/06 patient had code blue and sustained bilateral pneumothoraces after bag ventilation for which bilateral chest tubes were placed. Patient started on PLEX 7/7 per neuro recommendation. Patient extubated on 7/11 and weaning HFNC. --went to rehab, to walk,  [place] 4 weeks  Seen by pulmonary for shortness of breath, told she was deconditioned She does not do any regular exercise, very sedentary  SOB worse since hospitalization in June 2022  CT chest 11/2020, reviewed CAD, aortic athero  Followed by Dr. Llana Aliment, pulmonary Has sleep apnea On CPAP  EKG personally reviewed by myself on todays visit shows normal sinus rhythm with rate 64 bpm right bundle branch block, T wave ABN anterolateral leads,   Previous carotid ultrasound less than 39% disease on the left October 2018    Echo 04/25/2018, results discussed with her today - Left ventricle: The cavity size was normal. Systolic function was   normal. The estimated ejection fraction was in the range of 55%   to 60%. Wall motion was normal;  there were no regional wall   motion abnormalities. Doppler parameters are consistent with   abnormal left ventricular relaxation (grade 1 diastolic   dysfunction). - Aortic valve: There was trivial regurgitation. - Mitral valve: There was mild regurgitation. - Left atrium: The atrium was moderately dilated. - Right ventricle: Systolic function was normal. - Pulmonary arteries: Systolic pressure was within the normal   range. - Inferior vena cava: The vessel was normal in size. The   respirophasic diameter changes were in the normal range (>= 50%),   consistent with normal central venous pressure.   - Frequent APCs noted.   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  PSH:    Past Surgical History:  Procedure Laterality Date   APPENDECTOMY     BREAST CYST ASPIRATION Bilateral    BREAST CYST EXCISION Right 1978   benign   COLONOSCOPY     COLONOSCOPY WITH PROPOFOL N/A 07/10/2019   Procedure: COLONOSCOPY WITH PROPOFOL;  Surgeon: Lollie Sails, MD;  Location: [facility] ENDOSCOPY;  Service: Endoscopy;  Laterality: N/A;   ESOPHAGOGASTRODUODENOSCOPY (EGD) WITH PROPOFOL N/A 07/10/2019   Procedure: ESOPHAGOGASTRODUODENOSCOPY (EGD) WITH PROPOFOL;  Surgeon: Lollie Sails, MD;  Location:  [facility] ENDOSCOPY;  Service: Endoscopy;  Laterality: N/A;   HYSTEROSCOPY WITH D & C N/A 11/05/2018   Procedure: DILATATION AND CURETTAGE /HYSTEROSCOPY;  Surgeon: Homero Fellers, MD;  Location: [facility] ORS;  Service: Gynecology;  Laterality: N/A;   INSERT / REPLACE / REMOVE PACEMAKER     PACEMAKER IMPLANT N/A 07/23/2018   SJM Assurity 2272 implanted by Dr Rayann Heman for mobitz II second degree AV block   UTERINE POLYPS REMOVAL      Current Outpatient Medications  Medication Sig Dispense Refill   acetaminophen (TYLENOL) 325 MG tablet Take 325-650 mg by mouth every 6 (six) hours as needed for mild pain or headache.      clobetasol ointment (TEMOVATE) 0.05 %      famotidine (PEPCID) 20 MG tablet Take 1 tablet by mouth 2 (two) times daily.     hydrocerin (EUCERIN) CREA Apply 1 application topically 2 (two) times daily. To bilateral feet.  0   ketoconazole (NIZORAL) 2 % cream Apply 1 application topically daily. 15 g 0   losartan (COZAAR) 25 MG tablet Take 1 tablet (25 mg total) by mouth daily. 90 tablet 3   methylcellulose (CITRUCEL) oral powder      polyethylene glycol (MIRALAX / GLYCOLAX) 17 g packet Take 17 g by mouth daily as needed for severe constipation. 14 each 0   tacrolimus (PROTOPIC) 0.1 % ointment Apply topically 2 (two) times daily as needed (for psoriasis).      Vitamin D, Ergocalciferol, 50 MCG (2000 UT) CAPS Take by mouth.     No current facility-administered medications for this visit.     Allergies:   Etanercept and Nickel   Social History:  The patient  reports that she has never smoked. She has never used smokeless tobacco. She reports current alcohol use. She reports that she does not use drugs.   Family History:   family history includes Atrial fibrillation in her mother; Breast cancer in her maternal aunt, mother, and paternal aunt; Stroke in her father and mother.    Review of Systems: Review of Systems  Constitutional: Negative.   HENT: Negative.    Respiratory:  Positive for shortness of breath.   Cardiovascular:  Positive for leg swelling.  Gastrointestinal: Negative.   Musculoskeletal: Negative.   Neurological: Negative.    Psychiatric/Behavioral: Negative.    All other systems reviewed and are negative.  PHYSICAL EXAM: VS:  BP 130/70 (BP Location: Left Arm, Patient Position: Sitting, Cuff Size: Normal)   Pulse 64   Ht '5\' 6"'$  (1.676 m)   Wt 177 lb (80.3 kg)   SpO2 97%   BMI 28.57 kg/m  , BMI Body mass index is 28.57 kg/m. Constitutional:  oriented to person, place, and time. No distress.  HENT:  Head: Grossly normal Eyes:  no discharge. No scleral icterus.  Neck: No JVD, no carotid bruits  Cardiovascular: Regular rate and rhythm, no murmurs appreciated Pulmonary/Chest: Clear to auscultation bilaterally, no wheezes or rails Abdominal: Soft.  no distension.  no tenderness.  Musculoskeletal: Normal range of motion Neurological:  normal muscle tone. Coordination normal. No atrophy Skin: Skin warm and dry Psychiatric: normal affect, pleasant  Recent Labs: 02/09/2021: ALT 12; BUN 13; Creatinine, Ser 0.55; Hemoglobin 12.7; Platelets 210.0; Potassium 4.1; Pro B Natriuretic peptide (BNP) 44.0; Sodium 138; TSH 1.55    Lipid Panel Lab Results  Component Value Date   CHOL 234 (H) 11/09/2020   HDL 52.80 11/09/2020   LDLCALC 159 (H) 11/09/2020   TRIG 112.0 11/09/2020  Wt Readings from Last 3 Encounters:  05/23/21 177 lb (80.3 kg)  05/18/21 179 lb (81.2 kg)  05/13/21 177 lb 12.8 oz (80.6 kg)      ASSESSMENT AND PLAN:  Exertional dyspnea /angina Normal echo reviewed with her Suspect component of significant deconditioning Cardiac CTA ordered to rule out ischemia Would benefit from lung Works program  Prediabetes Recommended low carbohydrate intake  Bilateral leg edema Venous,  insignificant on today's visit Compression hose as needed  Mixed hyperlipidemia Continue Crestor  Essential hypertension Blood pressure is well controlled on today's visit. No changes made to the medications.  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 05/23/2021  [facility] [place], [place] [phone]

## 2021-05-23 ENCOUNTER — Ambulatory Visit (INDEPENDENT_AMBULATORY_CARE_PROVIDER_SITE_OTHER): Payer: Medicare Other | Admitting: Cardiovascular Disease

## 2021-05-23 ENCOUNTER — Encounter: Payer: Self-pay | Admitting: Cardiovascular Disease

## 2021-05-23 ENCOUNTER — Other Ambulatory Visit: Payer: Self-pay

## 2021-05-23 ENCOUNTER — Other Ambulatory Visit
Admission: RE | Admit: 2021-05-23 | Discharge: 2021-05-23 | Disposition: A | Discharge status: Home / Self Care | Payer: Medicare Other | Source: Ambulatory Visit | Attending: Ophthalmology | Admitting: Ophthalmology

## 2021-05-23 VITALS — BP 130/70 | HR 64 | Ht 66.0 in | Wt 177.0 lb

## 2021-05-23 DIAGNOSIS — I471 Supraventricular tachycardia: Secondary | ICD-10-CM | POA: Diagnosis not present

## 2021-05-23 DIAGNOSIS — R079 Chest pain, unspecified: Secondary | ICD-10-CM

## 2021-05-23 DIAGNOSIS — H47011 Ischemic optic neuropathy, right eye: Secondary | ICD-10-CM | POA: Insufficient documentation

## 2021-05-23 DIAGNOSIS — R0602 Shortness of breath: Secondary | ICD-10-CM

## 2021-05-23 DIAGNOSIS — I10 Essential (primary) hypertension: Secondary | ICD-10-CM

## 2021-05-23 DIAGNOSIS — R072 Precordial pain: Secondary | ICD-10-CM

## 2021-05-23 DIAGNOSIS — Z01812 Encounter for preprocedural laboratory examination: Secondary | ICD-10-CM | POA: Diagnosis not present

## 2021-05-23 DIAGNOSIS — I441 Atrioventricular block, second degree: Secondary | ICD-10-CM

## 2021-05-23 DIAGNOSIS — G4733 Obstructive sleep apnea (adult) (pediatric): Secondary | ICD-10-CM | POA: Diagnosis not present

## 2021-05-23 DIAGNOSIS — I4719 Other supraventricular tachycardia: Secondary | ICD-10-CM

## 2021-05-23 LAB — CBC
HCT: 40.4 % (ref 36.0–46.0)
Hemoglobin: 13.3 g/dL (ref 12.0–15.0)
MCH: 31.4 pg (ref 26.0–34.0)
MCHC: 32.9 g/dL (ref 30.0–36.0)
MCV: 95.5 fL (ref 80.0–100.0)
Platelets: 224 10*3/uL (ref 150–400)
RBC: 4.23 MIL/uL (ref 3.87–5.11)
RDW: 13 % (ref 11.5–15.5)
WBC: 6.4 10*3/uL (ref 4.0–10.5)
nRBC: 0 % (ref 0.0–0.2)

## 2021-05-23 LAB — C-REACTIVE PROTEIN: CRP: 0.9 mg/dL (ref <1.0)

## 2021-05-23 LAB — SEDIMENTATION RATE: Sed Rate: 22 mm/hr (ref 0–30)

## 2021-05-23 MED ORDER — METOPROLOL TARTRATE 50 MG PO TABS: ORAL_TABLET | ORAL | 0 refills | Status: DC

## 2021-05-23 NOTE — Patient Instructions (Addendum)
After CT scan looks ok, We can start Lung Works at [facility]  Medication Instructions:  No changes  If you need a refill on your cardiac medications before your next appointment, please call your pharmacy.    Lab work: - Your physician recommends that you have lab work today: BMP   If you have labs (blood work) drawn today and your tests are completely normal, you will receive your results only by: [employer] (if you have MyChart) OR A paper copy in the mail If you have any lab test that is abnormal or we need to change your treatment, we will call you to review the results.   Testing/Procedures: 1) Cardiac CT angiogram:  - Your physician has requested that you have cardiac CT. Cardiac computed tomography (CT) is a painless test that uses an x-ray machine to take clear, detailed pictures of your heart.     [facility] [address] [place], [place] [address] [phone]   If scheduled at [facility], please arrive 15 mins early for check-in and test prep.  Please follow these instructions carefully (unless otherwise directed):  On the Night Before the Test: Be sure to Drink plenty of water. Do not consume any caffeinated/decaffeinated beverages or chocolate 12 hours prior to your test. Do not take any antihistamines 12 hours prior to your test.   On the Day of the Test: Drink plenty of water until 1 hour prior to the test. Do not eat any food 4 hours prior to the test. You may take your regular medications prior to the test.  Take metoprolol (Lopressor) 50 mg two hours prior to test. FEMALES- please wear underwire-free bra if available, avoid dresses & tight clothing       After the Test: Drink plenty of water. After receiving IV contrast, you may experience a mild flushed feeling. This is normal. On occasion, you may experience a mild rash up to 24 hours after the test. This is not  dangerous. If this occurs, you can take Benadryl 25 mg and increase your fluid intake. If you experience trouble breathing, this can be serious. If it is severe call [phone] IMMEDIATELY. If it is mild, please call our office.   Please allow 2-4 weeks for scheduling of routine cardiac CTs. Some insurance companies require a pre-authorization which may delay scheduling of this test.   For non-scheduling related questions, please contact the cardiac imaging nurse navigator should you have any questions/concerns: Marchia Bond, Cardiac Imaging Nurse Navigator Gordy Clement, Cardiac Imaging Nurse Navigator [facility] Direct Office Dial: [phone]   For scheduling needs, including cancellations and rescheduling, please call [place], [phone].     Follow-Up: At [facility], you and your health needs are our priority.  As part of our continuing mission to provide you with exceptional heart care, we have created designated Provider Care Teams.  These Care Teams include your primary Cardiologist (physician) and Advanced Practice Providers (APPs -  Physician Assistants and Nurse Practitioners) who all work together to provide you with the care you need, when you need it.  You will need a follow up appointment in 12 months  Providers on your designated Care Team:   Murray Hodgkins, NP Christell Faith, PA-C Marrianne Mood, PA-C Cadence Kathlen Mody, [place]  Any Other Special Instructions Will Be Listed Below (If Applicable).  COVID-19 Vaccine Information can be found at: ShippingScam.co.uk For questions related to vaccine distribution or appointments, please email [email or web address]  or call [phone].    Cardiac CT Angiogram A cardiac CT angiogram is a procedure to look at the heart and the area around the heart. It may be done to help find the cause of chest pains or other symptoms of heart disease. During  this procedure, a substance called contrast dye is injected into the blood vessels in the area to be checked. A large X-ray machine, called a CT scanner, then takes detailed pictures of the heart and the surrounding area. The procedure is also sometimes called a coronary CTangiogram, coronary artery scanning, or CTA. A cardiac CT angiogram allows the health care provider to see how well blood is flowing to and from the heart. The health care provider will be able to see if there are any problems, such as: Blockage or narrowing of the coronary arteries in the heart. Fluid around the heart. Signs of weakness or disease in the muscles, valves, and tissues of the heart. Tell a health care provider about: Any allergies you have. This is especially important if you have had a previous allergic reaction to contrast dye. All medicines you are taking, including vitamins, herbs, eye drops, creams, and over-the-counter medicines. Any blood disorders you have. Any surgeries you have had. Any medical conditions you have. Whether you are pregnant or may be pregnant. Any anxiety disorders, chronic pain, or other conditions you have that may increase your stress or prevent you from lying still. What are the risks? Generally, this is a safe procedure. However, problems may occur, including: Bleeding. Infection. Allergic reactions to medicines or dyes. Damage to other structures or organs. Kidney damage from the contrast dye that is used. Increased risk of cancer from radiation exposure. This risk is low. Talk with your health care provider about: The risks and benefits of testing. How you can receive the lowest dose of radiation. What happens before the procedure? Wear comfortable clothing and remove any jewelry, glasses, dentures, and hearing aids. Follow instructions from your health care provider about eating and drinking. This may include: For 12 hours before the procedure -- avoid caffeine. This  includes tea, coffee, soda, energy drinks, and diet pills. Drink plenty of water or other fluids that do not have caffeine in them. Being well hydrated can prevent complications. For 4-6 hours before the procedure -- stop eating and drinking. The contrast dye can cause nausea, but this is less likely if your stomach is empty. Ask your health care provider about changing or stopping your regular medicines. This is especially important if you are taking diabetes medicines, blood thinners, or medicines to treat problems with erections (erectile dysfunction). What happens during the procedure?  Hair on your chest may need to be removed so that small sticky patches called electrodes can be placed on your chest. These will transmit information that helps to monitor your heart during the procedure. An IV will be inserted into one of your veins. You might be given a medicine to control your heart rate during the procedure. This will help to ensure that good images are obtained. You will be asked to lie on an exam table. This table will slide in and out of the CT machine during the procedure. Contrast dye will be injected into the IV. You might feel warm, or you may get a metallic taste in your mouth. You will be given a medicine called nitroglycerin. This will relax or dilate the arteries in your heart. The table that you are lying on will move into the CT  machine tunnel for the scan. The person running the machine will give you instructions while the scans are being done. You may be asked to: Keep your arms above your head. Hold your breath. Stay very still, even if the table is moving. When the scanning is complete, you will be moved out of the machine. The IV will be removed. The procedure may vary among health care providers and hospitals. What can I expect after the procedure? After your procedure, it is common to have: A metallic taste in your mouth from the contrast dye. A feeling of warmth. A  headache from the nitroglycerin. Follow these instructions at home: Take over-the-counter and prescription medicines only as told by your health care provider. If you are told, drink enough fluid to keep your urine pale yellow. This will help to flush the contrast dye out of your body. Most people can return to their normal activities right after the procedure. Ask your health care provider what activities are safe for you. It is up to you to get the results of your procedure. Ask your health care provider, or the department that is doing the procedure, when your results will be ready. Keep all follow-up visits as told by your health care provider. This is important. Contact a health care provider if: You have any symptoms of allergy to the contrast dye. These include: Shortness of breath. Rash or hives. A racing heartbeat. Summary A cardiac CT angiogram is a procedure to look at the heart and the area around the heart. It may be done to help find the cause of chest pains or other symptoms of heart disease. During this procedure, a large X-ray machine, called a CT scanner, takes detailed pictures of the heart and the surrounding area after a contrast dye has been injected into blood vessels in the area. Ask your health care provider about changing or stopping your regular medicines before the procedure. This is especially important if you are taking diabetes medicines, blood thinners, or medicines to treat erectile dysfunction. If you are told, drink enough fluid to keep your urine pale yellow. This will help to flush the contrast dye out of your body. This information is not intended to replace advice given to you by your health care provider. Make sure you discuss any questions you have with your healthcare provider. Document Revised: 05/14/2019 Document Reviewed: 05/14/2019 Elsevier Patient Education  [place].

## 2021-05-24 LAB — BASIC METABOLIC PANEL
BUN/Creatinine Ratio: 20 (ref 12–28)
BUN: 13 mg/dL (ref 8–27)
CO2: 27 mmol/L (ref 20–29)
Calcium: 10 mg/dL (ref 8.7–10.3)
Chloride: 105 mmol/L (ref 96–106)
Creatinine, Ser: 0.64 mg/dL (ref 0.57–1.00)
Glucose: 85 mg/dL (ref 65–99)
Potassium: 4.6 mmol/L (ref 3.5–5.2)
Sodium: 142 mmol/L (ref 134–144)
eGFR: 89 mL/min/{1.73_m2} (ref >59)

## 2021-05-26 ENCOUNTER — Other Ambulatory Visit: Payer: Medicare Other

## 2021-05-27 ENCOUNTER — Ambulatory Visit (INDEPENDENT_AMBULATORY_CARE_PROVIDER_SITE_OTHER): Payer: Medicare Other | Admitting: Obstetrics and Gynecology

## 2021-05-27 ENCOUNTER — Ambulatory Visit: Payer: Medicare Other | Admitting: Obstetrics and Gynecology

## 2021-05-27 ENCOUNTER — Encounter: Payer: Self-pay | Admitting: Obstetrics and Gynecology

## 2021-05-27 ENCOUNTER — Other Ambulatory Visit: Payer: Self-pay

## 2021-05-27 VITALS — BP 130/70 | Ht 66.0 in | Wt 178.0 lb

## 2021-05-27 DIAGNOSIS — N83299 Other ovarian cyst, unspecified side: Secondary | ICD-10-CM | POA: Diagnosis not present

## 2021-05-27 DIAGNOSIS — N83202 Unspecified ovarian cyst, left side: Secondary | ICD-10-CM

## 2021-05-27 NOTE — Progress Notes (Signed)
Patient ID: Alison Huffman, female   DOB: 12-08-1940, 80 y.o.   MRN: [id number]  Reason for Consult: Follow-up (U/S)   Referred by Leone Haven, MD  Subjective:     HPI:  Alison Huffman is a 80 y.o. female.  She presents today for a follow-up visit regarding her left ovarian cyst.  This cyst has been monitored and there has been some interval growth noted from her prior ultrasound.  Her CA125 remains within normal limits.  She has been considering removal by BSO and hysterectomy.  We have discussed this procedure several times in the past.  She has had other health complications which have delayed surgery.  She is currently undergoing evaluation with her cardiologist and has a imaging study planned for early September.  She is also experiencing edema of her right optic nerve and has follow-up planned with ophthalmology in 3 weeks.   Gynecological History  No LMP recorded. Patient is postmenopausal. Past Medical History:  Diagnosis Date   Ankle pain, right    Atypical chest pain    a. 10/2018 MV: EF 57%. No ischemia/infarct.   Coronary artery disease    Diastolic dysfunction    a. 04/2018 Echo: EF 55-60%, no rwma, Gr1 DD. Triv AI, mild MR. Mod dil LA. Nl RV fxn. PASP nl.   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 07/2018 s/p SJM Assurity MRI model [id number] (Ser# [id number]).   Sleep apnea    "using nasal pilllows since ~ 12/2017" (07/22/2018)   TMJ (dislocation  of temporomandibular joint)    Family History  Problem Relation Age of Onset   Stroke Mother    Atrial fibrillation Mother    Breast cancer Mother    Stroke Father    Breast cancer Maternal Aunt    Breast cancer Paternal Aunt    Past Surgical History:  Procedure Laterality Date   APPENDECTOMY     BREAST CYST ASPIRATION Bilateral    BREAST CYST EXCISION Right 1978   benign   COLONOSCOPY     COLONOSCOPY WITH PROPOFOL N/A 07/10/2019   Procedure: COLONOSCOPY WITH PROPOFOL;  Surgeon: Lollie Sails, MD;  Location: [facility] ENDOSCOPY;  Service: Endoscopy;  Laterality: N/A;   ESOPHAGOGASTRODUODENOSCOPY (EGD) WITH PROPOFOL N/A 07/10/2019   Procedure: ESOPHAGOGASTRODUODENOSCOPY (EGD) WITH PROPOFOL;  Surgeon: Lollie Sails, MD;  Location: [facility] ENDOSCOPY;  Service: Endoscopy;  Laterality: N/A;   HYSTEROSCOPY WITH D & C N/A 11/05/2018   Procedure: DILATATION AND CURETTAGE /HYSTEROSCOPY;  Surgeon: Homero Fellers, MD;  Location: [facility] ORS;  Service: Gynecology;  Laterality: N/A;   INSERT / REPLACE / REMOVE PACEMAKER     PACEMAKER IMPLANT N/A 07/23/2018   SJM Assurity [id number] implanted by Dr Rayann Heman for mobitz II second degree AV block   UTERINE POLYPS REMOVAL      Short Social  History:  Social History   Tobacco Use   Smoking status: Never   Smokeless tobacco: Never   Tobacco comments:    07/22/2018 "smoked some in college; 1960s;  nothing since"  Substance Use Topics   Alcohol use: Yes    Comment: rarely    Allergies  Allergen Reactions   Etanercept     Guillain Baree   Nickel     Current Outpatient Medications  Medication Sig Dispense Refill   acetaminophen (TYLENOL) 325 MG tablet Take 325-650 mg by mouth every 6 (six) hours as needed for mild pain or headache.      clobetasol ointment (TEMOVATE) 0.05 %      famotidine (PEPCID) 20 MG tablet Take 1 tablet by mouth 2 (two) times daily.     hydrocerin (EUCERIN) CREA Apply 1 application topically 2 (two) times daily. To  bilateral feet.  0   ketoconazole (NIZORAL) 2 % cream Apply 1 application topically daily. 15 g 0   losartan (COZAAR) 25 MG tablet Take 1 tablet (25 mg total) by mouth daily. 90 tablet 3   methylcellulose (CITRUCEL) oral powder      polyethylene glycol (MIRALAX / GLYCOLAX) 17 g packet Take 17 g by mouth daily as needed for severe constipation. 14 each 0   tacrolimus (PROTOPIC) 0.1 % ointment Apply topically 2 (two) times daily as needed (for psoriasis).      Vitamin D, Ergocalciferol, 50 MCG (2000 UT) CAPS Take by mouth.     metoprolol tartrate (LOPRESSOR) 50 MG tablet Take 1 tablet (50 mg) by mouth 2 hours prior to your Cardiac CT (Patient not taking: Reported on 05/27/2021) 1 tablet 0   No current facility-administered medications for this visit.    Review of Systems  Constitutional: Negative for chills, fatigue, fever and unexpected weight change.  HENT: Negative for trouble swallowing.  Eyes: Negative for loss of vision.  Respiratory: Negative for cough, shortness of breath and wheezing.  Cardiovascular: Negative for chest pain, leg swelling, palpitations and syncope.  GI: Negative for abdominal pain, blood in stool, diarrhea, nausea and vomiting.  GU: Negative for difficulty urinating, dysuria, frequency and hematuria.  Musculoskeletal: Negative for back pain, leg pain and joint pain.  Skin: Negative for rash.  Neurological: Negative for dizziness, headaches, light-headedness, numbness and seizures.  Psychiatric: Negative for behavioral problem, confusion, depressed mood and sleep disturbance.       Objective:  Objective   Vitals:   05/27/21 0946  BP: 130/70  Weight: 178 lb (80.7 kg)  Height: '5\' 6"'$  (1.676 m)   Body mass index is 28.73 kg/m.  Physical Exam Vitals and nursing note reviewed. Exam conducted with a chaperone present.  Constitutional:      Appearance: Normal appearance.  HENT:     Head: Normocephalic and atraumatic.  Eyes:     Extraocular Movements:  Extraocular movements intact.     Pupils: Pupils are equal, round, and reactive to light.  Cardiovascular:     Rate and Rhythm: Normal rate and regular rhythm.  Pulmonary:     Effort: Pulmonary effort is normal.     Breath sounds: Normal breath sounds.  Abdominal:     General: Abdomen is flat.     Palpations: Abdomen is soft.  Musculoskeletal:     Cervical back: Normal range of motion.  Skin:    General: Skin is warm and dry.  Neurological:     General: No focal deficit present.     Mental Status: She is alert and  oriented to person, place, and time.  Psychiatric:        Behavior: Behavior normal.        Thought Content: Thought content normal.        Judgment: Judgment normal.    Assessment/Plan:     80 year old with complex left ovarian cyst We again discussed hysterectomy with BSO because of the increasing size of the left ovarian cyst..  The patient is considering this procedure.  She will send me a message when she feels ready to move forward with scheduling this procedure.  If surgery is delayed follow-up ultrasound  and CA125can be planned in 6 months.  She will speak to her cardiologist and optometrist regarding surgery.   More than 20 minutes were spent face to face with the patient in the room, reviewing the medical record, labs and images, and coordinating care for the patient. The plan of management was discussed in detail and counseling was provided.     Adrian Prows MD [facility], [facility] 05/27/2021 1:12 PM

## 2021-06-01 ENCOUNTER — Telehealth (HOSPITAL_COMMUNITY): Payer: Self-pay | Admitting: Emergency Medicine

## 2021-06-01 NOTE — Telephone Encounter (Signed)
Reaching out to patient to offer assistance regarding upcoming cardiac imaging study; pt verbalizes understanding of appt date/time, parking situation and where to check in, pre-test NPO status and medications ordered, and verified current allergies; name and call back number provided for further questions should they arise Alison Bond RN Navigator Cardiac Imaging [facility] [phone] office [phone] cell  Denies iv issues Denies clasutro '50mg'$  metoprolol

## 2021-06-01 NOTE — Telephone Encounter (Signed)
Attempted to call patient regarding upcoming cardiac CT appointment. °Left message on voicemail with name and callback number °Indiana Pechacek RN Navigator Cardiac Imaging °[facility] °[phone] Office °[phone] Cell ° °

## 2021-06-02 ENCOUNTER — Other Ambulatory Visit: Payer: Self-pay

## 2021-06-02 ENCOUNTER — Ambulatory Visit
Admission: RE | Admit: 2021-06-02 | Discharge: 2021-06-02 | Disposition: A | Discharge status: Home / Self Care | Payer: Medicare Other | Source: Ambulatory Visit | Attending: Cardiovascular Disease | Admitting: Cardiovascular Disease

## 2021-06-02 DIAGNOSIS — R072 Precordial pain: Secondary | ICD-10-CM | POA: Insufficient documentation

## 2021-06-02 MED ORDER — IOHEXOL 350 MG/ML SOLN
75.0000 mL | Freq: Once | INTRAVENOUS | Status: AC | PRN
  Administered 2021-06-02: 75 mL via INTRAVENOUS

## 2021-06-02 MED ORDER — NITROGLYCERIN 0.4 MG SL SUBL
0.8000 mg | SUBLINGUAL_TABLET | Freq: Once | SUBLINGUAL | Status: AC
  Administered 2021-06-02: 0.8 mg via SUBLINGUAL

## 2021-06-02 NOTE — Progress Notes (Signed)
Patient tolerated CT well. Drank water after. Vital signs stable encourage to drink water throughout day.Reasons explained and verbalized understanding. Ambulated steady gait.  

## 2021-06-10 ENCOUNTER — Ambulatory Visit: Payer: Medicare Other | Admitting: Neurology

## 2021-06-14 ENCOUNTER — Telehealth: Payer: Self-pay | Admitting: Cardiovascular Disease

## 2021-06-14 ENCOUNTER — Telehealth: Payer: Self-pay

## 2021-06-14 DIAGNOSIS — I5032 Chronic diastolic (congestive) heart failure: Secondary | ICD-10-CM

## 2021-06-14 DIAGNOSIS — J479 Bronchiectasis, uncomplicated: Secondary | ICD-10-CM

## 2021-06-14 DIAGNOSIS — J984 Other disorders of lung: Secondary | ICD-10-CM

## 2021-06-14 DIAGNOSIS — G4733 Obstructive sleep apnea (adult) (pediatric): Secondary | ICD-10-CM

## 2021-06-14 DIAGNOSIS — R0609 Other forms of dyspnea: Secondary | ICD-10-CM

## 2021-06-14 DIAGNOSIS — R0600 Dyspnea, unspecified: Secondary | ICD-10-CM

## 2021-06-14 DIAGNOSIS — R0602 Shortness of breath: Secondary | ICD-10-CM

## 2021-06-14 NOTE — Telephone Encounter (Signed)
Patient would like to start [facility]. Please call to discuss.

## 2021-06-14 NOTE — Telephone Encounter (Signed)
Left detail message on VM of pt's recent results okay by DPR, Dr. Rockey Situ advised   "Cardiac CTA  Very low calcium score  No significant coronary disease noted  Minimal documented in the LAD "  At this time, no further recommendations or medications changes, advised to call office for any concerns or questions, otherwise will see at next visit.   Pt has seen her results on mychart and Dr. Donivan Scull comment.  Seen by patient Alison Huffman on 06/13/2021  8:04 PM

## 2021-06-14 NOTE — Telephone Encounter (Signed)
Was able to reach back out to Alison Huffman, after calling her earlier today with her CCTA results she would like to start [facility] [employer]. Since her CCTA showed good results, she is requesting to move froward with the [facility] program.   Will place referral and explained to pt that she should expect a phone call for her fist appt. Alison Huffman very thankful. Nothing further at this time.

## 2021-06-21 ENCOUNTER — Other Ambulatory Visit: Payer: Self-pay

## 2021-06-21 ENCOUNTER — Ambulatory Visit
Admission: RE | Admit: 2021-06-21 | Discharge: 2021-06-21 | Disposition: A | Discharge status: Home / Self Care | Payer: Medicare Other | Source: Ambulatory Visit | Attending: Family Medicine | Admitting: Family Medicine

## 2021-06-21 DIAGNOSIS — Z1231 Encounter for screening mammogram for malignant neoplasm of breast: Secondary | ICD-10-CM | POA: Diagnosis not present

## 2021-06-23 DIAGNOSIS — H903 Sensorineural hearing loss, bilateral: Secondary | ICD-10-CM | POA: Diagnosis not present

## 2021-06-23 DIAGNOSIS — J3801 Paralysis of vocal cords and larynx, unilateral: Secondary | ICD-10-CM | POA: Diagnosis not present

## 2021-06-23 DIAGNOSIS — R490 Dysphonia: Secondary | ICD-10-CM | POA: Diagnosis not present

## 2021-06-27 DIAGNOSIS — H47011 Ischemic optic neuropathy, right eye: Secondary | ICD-10-CM | POA: Diagnosis not present

## 2021-06-28 DIAGNOSIS — L603 Nail dystrophy: Secondary | ICD-10-CM | POA: Diagnosis not present

## 2021-06-28 DIAGNOSIS — M2042 Other hammer toe(s) (acquired), left foot: Secondary | ICD-10-CM | POA: Diagnosis not present

## 2021-06-28 DIAGNOSIS — M2041 Other hammer toe(s) (acquired), right foot: Secondary | ICD-10-CM | POA: Diagnosis not present

## 2021-06-28 DIAGNOSIS — I7091 Generalized atherosclerosis: Secondary | ICD-10-CM | POA: Diagnosis not present

## 2021-07-01 ENCOUNTER — Encounter: Payer: Medicare Other | Attending: Cardiovascular Disease | Admitting: *Deleted

## 2021-07-01 ENCOUNTER — Other Ambulatory Visit: Payer: Self-pay

## 2021-07-01 DIAGNOSIS — R0600 Dyspnea, unspecified: Secondary | ICD-10-CM

## 2021-07-01 NOTE — Progress Notes (Signed)
Initial orientation completed. Diagnosis can be found in [facility] 8/22. EP orientation scheduled for Wednesday 10/5 at 9:30.

## 2021-07-06 ENCOUNTER — Encounter: Payer: Medicare Other | Attending: Cardiovascular Disease

## 2021-07-06 ENCOUNTER — Other Ambulatory Visit: Payer: Self-pay

## 2021-07-06 VITALS — Ht 66.5 in | Wt 178.9 lb

## 2021-07-06 DIAGNOSIS — R0600 Dyspnea, unspecified: Secondary | ICD-10-CM | POA: Diagnosis not present

## 2021-07-06 NOTE — Patient Instructions (Signed)
Patient Instructions  Patient Details  Name: Alison Huffman MRN: [id number] Date of Birth: 11-Jan-1941 Referring Provider:  Minna Merritts, MD  Below are your personal goals for exercise, nutrition, and risk factors. Our goal is to help you stay on track towards obtaining and maintaining these goals. We will be discussing your progress on these goals with you throughout the program.  Initial Exercise Prescription:  Initial Exercise Prescription - 07/06/21 1000       Date of Initial Exercise RX and Referring Provider   Date 07/06/21    Referring Provider Ida Rogue MD      Oxygen   Maintain Oxygen Saturation 88% or higher      Treadmill   MPH 1.6    Grade 0    Minutes 15    METs 2.23      Recumbant Bike   Level 1    RPM 60    Watts 15    Minutes 15    METs 1.6      NuStep   Level 1    SPM 80    Minutes 15    METs 1.6      Prescription Details   Frequency (times per week) 2    Duration Progress to 30 minutes of continuous aerobic without signs/symptoms of physical distress      Intensity   THRR 40-80% of Max Heartrate 94-124    Ratings of Perceived Exertion 11-13    Perceived Dyspnea 0-4      Progression   Progression Continue to progress workloads to maintain intensity without signs/symptoms of physical distress.      Resistance Training   Training Prescription Yes    Weight 3 lb    Reps 10-15             Exercise Goals: Frequency: Be able to perform aerobic exercise two to three times per week in program working toward 2-5 days per week of home exercise.  Intensity: Work with a perceived exertion of 11 (fairly light) - 15 (hard) while following your exercise prescription.  We will make changes to your prescription with you as you progress through the program.   Duration: Be able to do 30 to 45 minutes of continuous aerobic exercise in addition to a 5 minute warm-up and a 5 minute cool-down routine.   Nutrition Goals: Your personal nutrition  goals will be established when you do your nutrition analysis with the dietician.  The following are general nutrition guidelines to follow: Cholesterol < 200mg /day Sodium < 1500mg /day Fiber: Women over 50 yrs - 21 grams per day  Personal Goals:  Personal Goals and Risk Factors at Admission - 07/06/21 1047       Core Components/Risk Factors/Patient Goals on Admission    Weight Management Yes;Weight Loss    Intervention Weight Management: Provide education and appropriate resources to help participant work on and attain dietary goals.;Weight Management: Develop a combined nutrition and exercise program designed to reach desired caloric intake, while maintaining appropriate intake of nutrient and fiber, sodium and fats, and appropriate energy expenditure required for the weight goal.;Weight Management/Obesity: Establish reasonable short term and long term weight goals.    Admit Weight 178 lb 14.4 oz (81.1 kg)    Goal Weight: Short Term 173 lb (78.5 kg)    Goal Weight: Long Term 168 lb (76.2 kg)    Expected Outcomes Short Term: Continue to assess and modify interventions until short term weight is achieved;Long Term: Adherence to nutrition and physical  activity/exercise program aimed toward attainment of established weight goal;Weight Loss: Understanding of general recommendations for a balanced deficit meal plan, which promotes 1-2 lb weight loss per week and includes a negative energy balance of [phone] kcal/d;Understanding of distribution of calorie intake throughout the day with the consumption of 4-5 meals/snacks;Understanding recommendations for meals to include 15-35% energy as protein, 25-35% energy from fat, 35-60% energy from carbohydrates, less than 200mg  of dietary cholesterol, 20-35 gm of total fiber daily    Hypertension Yes    Intervention Provide education on lifestyle modifcations including regular physical activity/exercise, weight management, moderate sodium restriction and  increased consumption of fresh fruit, vegetables, and low fat dairy, alcohol moderation, and smoking cessation.;Monitor prescription use compliance.    Expected Outcomes Short Term: Continued assessment and intervention until BP is < 140/59mm HG in hypertensive participants. < 130/69mm HG in hypertensive participants with diabetes, heart failure or chronic kidney disease.;Long Term: Maintenance of blood pressure at goal levels.    Lipids Yes    Intervention Provide education and support for participant on nutrition & aerobic/resistive exercise along with prescribed medications to achieve LDL 70mg , HDL >40mg .    Expected Outcomes Short Term: Participant states understanding of desired cholesterol values and is compliant with medications prescribed. Participant is following exercise prescription and nutrition guidelines.;Long Term: Cholesterol controlled with medications as prescribed, with individualized exercise RX and with personalized nutrition plan. Value goals: LDL < 70mg , HDL > 40 mg.             Tobacco Use Initial Evaluation: Social History   Tobacco Use  Smoking Status Never  Smokeless Tobacco Never  Tobacco Comments   07/22/2018 "smoked some in college; 1960s;  nothing since"    Exercise Goals and Review:  Exercise Goals     Row Name 07/06/21 1047             Exercise Goals   Increase Physical Activity Yes       Intervention Provide advice, education, support and counseling about physical activity/exercise needs.;Develop an individualized exercise prescription for aerobic and resistive training based on initial evaluation findings, risk stratification, comorbidities and participant's personal goals.       Expected Outcomes Short Term: Attend rehab on a regular basis to increase amount of physical activity.;Long Term: Add in home exercise to make exercise part of routine and to increase amount of physical activity.;Long Term: Exercising regularly at least 3-5 days a week.        Increase Strength and Stamina Yes       Intervention Provide advice, education, support and counseling about physical activity/exercise needs.;Develop an individualized exercise prescription for aerobic and resistive training based on initial evaluation findings, risk stratification, comorbidities and participant's personal goals.       Expected Outcomes Short Term: Increase workloads from initial exercise prescription for resistance, speed, and METs.;Short Term: Perform resistance training exercises routinely during rehab and add in resistance training at home;Long Term: Improve cardiorespiratory fitness, muscular endurance and strength as measured by increased METs and functional capacity (6MWT)       Able to understand and use rate of perceived exertion (RPE) scale Yes       Intervention Provide education and explanation on how to use RPE scale       Expected Outcomes Short Term: Able to use RPE daily in rehab to express subjective intensity level;Long Term:  Able to use RPE to guide intensity level when exercising independently       Able to understand and  use Dyspnea scale Yes       Intervention Provide education and explanation on how to use Dyspnea scale       Expected Outcomes Short Term: Able to use Dyspnea scale daily in rehab to express subjective sense of shortness of breath during exertion;Long Term: Able to use Dyspnea scale to guide intensity level when exercising independently       Knowledge and understanding of Target Heart Rate Range (THRR) Yes       Intervention Provide education and explanation of THRR including how the numbers were predicted and where they are located for reference       Expected Outcomes Short Term: Able to state/look up THRR;Long Term: Able to use THRR to govern intensity when exercising independently;Short Term: Able to use daily as guideline for intensity in rehab       Able to check pulse independently Yes       Intervention Provide education and  demonstration on how to check pulse in carotid and radial arteries.;Review the importance of being able to check your own pulse for safety during independent exercise       Expected Outcomes Short Term: Able to explain why pulse checking is important during independent exercise;Long Term: Able to check pulse independently and accurately       Understanding of Exercise Prescription Yes       Intervention Provide education, explanation, and written materials on patient's individual exercise prescription       Expected Outcomes Short Term: Able to explain program exercise prescription;Long Term: Able to explain home exercise prescription to exercise independently                Copy of goals given to participant.

## 2021-07-06 NOTE — Progress Notes (Signed)
Pulmonary Individual Treatment Plan  Patient Details  Name: Alison Huffman MRN: [id number] Date of Birth: 05/31/1941 Referring Provider:   Flowsheet Row Pulmonary Rehab from 07/06/2021 in [facility] Cardiac and Pulmonary Rehab  Referring Provider Ida Rogue MD       Initial Encounter Date:  Flowsheet Row Pulmonary Rehab from 07/06/2021 in [facility] Cardiac and Pulmonary Rehab  Date 07/06/21       Visit Diagnosis: Dyspnea, unspecified type  Patient's Home Medications on Admission:  Current Outpatient Medications:    acetaminophen (TYLENOL) 325 MG tablet, Take 325-650 mg by mouth every 6 (six) hours as needed for mild pain or headache. , Disp: , Rfl:    clobetasol ointment (TEMOVATE) 0.05 %, , Disp: , Rfl:    famotidine (PEPCID) 20 MG tablet, Take 1 tablet by mouth 2 (two) times daily. (Patient not taking: Reported on 07/01/2021), Disp: , Rfl:    hydrocerin (EUCERIN) CREA, Apply 1 application topically 2 (two) times daily. To bilateral feet., Disp: , Rfl: 0   ketoconazole (NIZORAL) 2 % cream, Apply 1 application topically daily. (Patient not taking: Reported on 07/01/2021), Disp: 15 g, Rfl: 0   losartan (COZAAR) 25 MG tablet, Take 1 tablet (25 mg total) by mouth daily., Disp: 90 tablet, Rfl: 3   methylcellulose (CITRUCEL) oral powder, , Disp: , Rfl:    metoprolol tartrate (LOPRESSOR) 50 MG tablet, Take 1 tablet (50 mg) by mouth 2 hours prior to your Cardiac CT (Patient not taking: No sig reported), Disp: 1 tablet, Rfl: 0   polyethylene glycol (MIRALAX / GLYCOLAX) 17 g packet, Take 17 g by mouth daily as needed for severe constipation., Disp: 14 each, Rfl: 0   tacrolimus (PROTOPIC) 0.1 % ointment, Apply topically 2 (two) times daily as needed (for psoriasis). , Disp: , Rfl:    Vitamin D, Ergocalciferol, 50 MCG (2000 UT) CAPS, Take by mouth., Disp: , Rfl:   Past Medical History: Past Medical History:  Diagnosis Date   Ankle pain, right    Atypical chest pain    a. 10/2018 MV: EF 57%. No  ischemia/infarct.   Coronary artery disease    Diastolic dysfunction    a. 04/2018 Echo: EF 55-60%, no rwma, Gr1 DD. Triv AI, mild MR. Mod dil LA. Nl RV fxn. PASP nl.   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 07/2018 s/p SJM Assurity MRI model PM2271 (Ser# [id number]).   Sleep apnea    "using nasal pilllows since ~ 12/2017" (07/22/2018)   TMJ (dislocation of temporomandibular joint)     Tobacco Use: Social History   Tobacco Use  Smoking Status Never  Smokeless Tobacco Never  Tobacco Comments   07/22/2018 "smoked some in college; 1960s;  nothing since"    Labs: Recent Review Flowsheet Data     Labs for ITP Cardiac and Pulmonary Rehab Latest Ref  Rng & Units 03/24/2020 04/03/2020 04/06/2020 04/11/2020 11/09/2020   Cholestrol 0 - 200 mg/dL - - - - 234(H)   LDLCALC 0 - 99 mg/dL - - - - 159(H)   LDLDIRECT mg/dL - - - - -   HDL >39.00 mg/dL - - - - 52.80   Trlycerides 0.0 - 149.0 mg/dL - - - - 112.0   Hemoglobin A1c 4.8 - 5.6 % 6.1 6.0(H) - - -   PHART 7.350 - 7.450 - 7.374 7.349(L) 7.470(H) -   PCO2ART 32.0 - 48.0 mmHg - 34.9 48.2(H) 38.4 -   HCO3 20.0 - 28.0 mmol/L - 20.4 26.3 27.9 -   TCO2 22 - 32 mmol/L - 21(L) 28 29 -   ACIDBASEDEF 0.0 - 2.0 mmol/L - 4.0(H) - - -   O2SAT % - 100.0 96.0 98.0 -        Pulmonary Assessment Scores:  Pulmonary Assessment Scores     Row Name 07/06/21 1036         ADL UCSD   ADL Phase Entry     SOB Score total 62     Rest 0      Walk 1     Stairs 4     Bath 1     Dress 1     Shop 4           CAT Score   CAT Score 9           mMRC Score   mMRC Score 1              UCSD: Self-administered rating of dyspnea associated with activities of daily living (ADLs) 6-point scale (0 = "not at all" to 5 = "maximal or unable to do because of breathlessness")  Scoring Scores range from 0 to 120.  Minimally important difference is 5 units  CAT: CAT can identify the health impairment of COPD patients and is better correlated with disease progression.  CAT has a scoring range of zero to 40. The CAT score is classified into four groups of low (less than 10), medium (10 - 20), high (21-30) and very high (31-40) based on the impact level of disease on health status. A CAT score over 10 suggests significant symptoms.  A worsening CAT score could be explained by an exacerbation, poor medication adherence, poor inhaler technique, or progression of COPD or comorbid conditions.  CAT MCID is 2 points  mMRC: mMRC (Modified Medical Research Council) Dyspnea Scale is used to assess the degree of baseline functional disability in patients of respiratory disease due to dyspnea. No minimal important difference is established. A decrease in score of 1 point or greater is considered a positive change.   Pulmonary Function Assessment:   Exercise Target Goals: Exercise Program Goal: Individual exercise prescription set using results from initial 6 min walk test and THRR while considering  patient's activity barriers and safety.   Exercise Prescription Goal: Initial exercise prescription builds to 30-45 minutes a day of aerobic activity, 2-3 days per week.  Home exercise guidelines will be given to patient during program as part of exercise prescription that the participant will acknowledge.  Education: Aerobic Exercise: - Group verbal and visual presentation on the components of exercise prescription. Introduces F.I.T.T principle  from ACSM for exercise prescriptions.  Reviews F.I.T.T. principles of aerobic exercise including progression. Written material given at graduation.   Education: Resistance Exercise: - Group verbal and visual presentation on the components of exercise prescription. Introduces F.I.T.T principle  from ACSM for exercise prescriptions  Reviews F.I.T.T. principles of resistance exercise including progression. Written material given at graduation.    Education: Exercise & Equipment Safety: - Individual verbal instruction and demonstration of equipment use and safety with use of the equipment. Flowsheet Row Pulmonary Rehab from 07/06/2021 in [facility] Cardiac and Pulmonary Rehab  Education need identified 07/06/21  Date 07/06/21  Educator [place]  Instruction Review Code 1- Verbalizes Understanding       Education: Exercise Physiology & General Exercise Guidelines: - Group verbal and written instruction with models to review the exercise physiology of the cardiovascular system and associated critical values. Provides general exercise guidelines with specific guidelines to those with heart or lung disease.    Education: Flexibility, Balance, Mind/Body Relaxation: - Group verbal and visual presentation with interactive activity on the components of exercise prescription. Introduces F.I.T.T principle from ACSM for exercise prescriptions. Reviews F.I.T.T. principles of flexibility and balance exercise training including progression. Also discusses the mind body connection.  Reviews various relaxation techniques to help reduce and manage stress (i.e. Deep breathing, progressive muscle relaxation, and visualization). Balance handout provided to take home. Written material given at graduation.   Activity Barriers & Risk Stratification:  Activity Barriers & Cardiac Risk Stratification - 07/06/21 1039       Activity Barriers & Cardiac Risk Stratification   Activity Barriers Joint Problems;Back Problems;Other  (comment);Deconditioning;Muscular Weakness;Shortness of Breath    Comments Guillian Barre, targets feet and legs bilaterally             6 Minute Walk:  6 Minute Walk     Row Name 07/06/21 1041         6 Minute Walk   Phase Initial     Distance 930 feet     Walk Time 6 minutes     # of Rest Breaks 0     MPH 1.76     METS 1.67     RPE 13     Perceived Dyspnea  2     VO2 Peak 5.86     Symptoms Yes (comment)     Comments SOB, fatigued legs     Resting HR 65 bpm     Resting BP 136/76     Resting Oxygen Saturation  95 %     Exercise Oxygen Saturation  during 6 min walk 94 %     Max Ex. HR 99 bpm     Max Ex. BP 144/72     2 Minute Post BP 134/74           Interval HR   1 Minute HR 89     2 Minute HR 96     3 Minute HR 99     4 Minute HR 95     5 Minute HR 98     6 Minute HR 99     2 Minute Post HR 61     Interval Heart Rate? Yes           Interval Oxygen   Interval Oxygen? Yes     Baseline Oxygen Saturation % 95 %     1 Minute Oxygen Saturation % 96 %     1 Minute Liters of Oxygen 0 L  RA     2 Minute Oxygen Saturation % 94 %     2 Minute Liters of Oxygen 0 L     3 Minute Oxygen Saturation % 95 %     3 Minute Liters of Oxygen 0 L  4 Minute Oxygen Saturation % 95 %     4 Minute Liters of Oxygen 0 L     5 Minute Oxygen Saturation % 95 %     5 Minute Liters of Oxygen 0 L     6 Minute Oxygen Saturation % 95 %     6 Minute Liters of Oxygen 0 L     2 Minute Post Oxygen Saturation % 96 %     2 Minute Post Liters of Oxygen 0 L             Oxygen Initial Assessment:  Oxygen Initial Assessment - 07/06/21 1035       Home Oxygen   Home Oxygen Device None    Sleep Oxygen Prescription CPAP    Home Exercise Oxygen Prescription None    Home Resting Oxygen Prescription None    Compliance with Home Oxygen Use Yes      Initial 6 min Walk   Oxygen Used None      Program Oxygen Prescription   Program Oxygen Prescription None      Intervention   Short  Term Goals To learn and understand importance of monitoring SPO2 with pulse oximeter and demonstrate accurate use of the pulse oximeter.;To learn and understand importance of maintaining oxygen saturations>88%;To learn and demonstrate proper pursed lip breathing techniques or other breathing techniques.     Long  Term Goals Verbalizes importance of monitoring SPO2 with pulse oximeter and return demonstration;Maintenance of O2 saturations>88%;Exhibits proper breathing techniques, such as pursed lip breathing or other method taught during program session             Oxygen Re-Evaluation:   Oxygen Discharge (Final Oxygen Re-Evaluation):   Initial Exercise Prescription:  Initial Exercise Prescription - 07/06/21 1000       Date of Initial Exercise RX and Referring Provider   Date 07/06/21    Referring Provider Ida Rogue MD      Oxygen   Maintain Oxygen Saturation 88% or higher      Treadmill   MPH 1.6    Grade 0    Minutes 15    METs 2.23      Recumbant Bike   Level 1    RPM 60    Watts 15    Minutes 15    METs 1.6      NuStep   Level 1    SPM 80    Minutes 15    METs 1.6      Prescription Details   Frequency (times per week) 2    Duration Progress to 30 minutes of continuous aerobic without signs/symptoms of physical distress      Intensity   THRR 40-80% of Max Heartrate 94-124    Ratings of Perceived Exertion 11-13    Perceived Dyspnea 0-4      Progression   Progression Continue to progress workloads to maintain intensity without signs/symptoms of physical distress.      Resistance Training   Training Prescription Yes    Weight 3 lb    Reps 10-15             Perform Capillary Blood Glucose checks as needed.  Exercise Prescription Changes:   Exercise Prescription Changes     Row Name 07/06/21 1000             Response to Exercise   Blood Pressure (Admit) 136/76       Blood Pressure (Exercise) 144/72       Blood  Pressure (Exit)  134/74       Heart Rate (Admit) 65 bpm       Heart Rate (Exercise) 99 bpm       Heart Rate (Exit) 61 bpm       Oxygen Saturation (Admit) 95 %       Oxygen Saturation (Exercise) 94 %       Oxygen Saturation (Exit) 96 %       Rating of Perceived Exertion (Exercise) 13       Perceived Dyspnea (Exercise) 2       Symptoms SOB, fatigued legs       Comments walk test results                Exercise Comments:   Exercise Goals and Review:   Exercise Goals     Row Name 07/06/21 1047             Exercise Goals   Increase Physical Activity Yes       Intervention Provide advice, education, support and counseling about physical activity/exercise needs.;Develop an individualized exercise prescription for aerobic and resistive training based on initial evaluation findings, risk stratification, comorbidities and participant's personal goals.       Expected Outcomes Short Term: Attend rehab on a regular basis to increase amount of physical activity.;Long Term: Add in home exercise to make exercise part of routine and to increase amount of physical activity.;Long Term: Exercising regularly at least 3-5 days a week.       Increase Strength and Stamina Yes       Intervention Provide advice, education, support and counseling about physical activity/exercise needs.;Develop an individualized exercise prescription for aerobic and resistive training based on initial evaluation findings, risk stratification, comorbidities and participant's personal goals.       Expected Outcomes Short Term: Increase workloads from initial exercise prescription for resistance, speed, and METs.;Short Term: Perform resistance training exercises routinely during rehab and add in resistance training at home;Long Term: Improve cardiorespiratory fitness, muscular endurance and strength as measured by increased METs and functional capacity (6MWT)       Able to understand and use rate of perceived exertion (RPE) scale Yes        Intervention Provide education and explanation on how to use RPE scale       Expected Outcomes Short Term: Able to use RPE daily in rehab to express subjective intensity level;Long Term:  Able to use RPE to guide intensity level when exercising independently       Able to understand and use Dyspnea scale Yes       Intervention Provide education and explanation on how to use Dyspnea scale       Expected Outcomes Short Term: Able to use Dyspnea scale daily in rehab to express subjective sense of shortness of breath during exertion;Long Term: Able to use Dyspnea scale to guide intensity level when exercising independently       Knowledge and understanding of Target Heart Rate Range (THRR) Yes       Intervention Provide education and explanation of THRR including how the numbers were predicted and where they are located for reference       Expected Outcomes Short Term: Able to state/look up THRR;Long Term: Able to use THRR to govern intensity when exercising independently;Short Term: Able to use daily as guideline for intensity in rehab       Able to check pulse independently Yes       Intervention Provide education and  demonstration on how to check pulse in carotid and radial arteries.;Review the importance of being able to check your own pulse for safety during independent exercise       Expected Outcomes Short Term: Able to explain why pulse checking is important during independent exercise;Long Term: Able to check pulse independently and accurately       Understanding of Exercise Prescription Yes       Intervention Provide education, explanation, and written materials on patient's individual exercise prescription       Expected Outcomes Short Term: Able to explain program exercise prescription;Long Term: Able to explain home exercise prescription to exercise independently                Exercise Goals Re-Evaluation :   Discharge Exercise Prescription (Final Exercise Prescription Changes):   Exercise Prescription Changes - 07/06/21 1000       Response to Exercise   Blood Pressure (Admit) 136/76    Blood Pressure (Exercise) 144/72    Blood Pressure (Exit) 134/74    Heart Rate (Admit) 65 bpm    Heart Rate (Exercise) 99 bpm    Heart Rate (Exit) 61 bpm    Oxygen Saturation (Admit) 95 %    Oxygen Saturation (Exercise) 94 %    Oxygen Saturation (Exit) 96 %    Rating of Perceived Exertion (Exercise) 13    Perceived Dyspnea (Exercise) 2    Symptoms SOB, fatigued legs    Comments walk test results             Nutrition:  Target Goals: Understanding of nutrition guidelines, daily intake of sodium 1500mg , cholesterol 200mg , calories 30% from fat and 7% or less from saturated fats, daily to have 5 or more servings of fruits and vegetables.  Education: All About Nutrition: -Group instruction provided by verbal, written material, interactive activities, discussions, models, and posters to present general guidelines for heart healthy nutrition including fat, fiber, MyPlate, the role of sodium in heart healthy nutrition, utilization of the nutrition label, and utilization of this knowledge for meal planning. Follow up email sent as well. Written material given at graduation.   Biometrics:  Pre Biometrics - 07/06/21 1039       Pre Biometrics   Height 5' 6.5" (1.689 m)    Weight 178 lb 14.4 oz (81.1 kg)    BMI (Calculated) 28.45    Single Leg Stand 3.97 seconds              Nutrition Therapy Plan and Nutrition Goals:   Nutrition Assessments:  MEDIFICTS Score Key: ?70 Need to make dietary changes  40-70 Heart Healthy Diet ? 40 Therapeutic Level Cholesterol Diet  Flowsheet Row Pulmonary Rehab from 07/06/2021 in [facility] Cardiac and Pulmonary Rehab  Picture Your Plate Total Score on Admission 61      Picture Your Plate Scores: <41 Unhealthy dietary pattern with much room for improvement. 41-50 Dietary pattern unlikely to meet recommendations for good health and  room for improvement. 51-60 More healthful dietary pattern, with some room for improvement.  >60 Healthy dietary pattern, although there may be some specific behaviors that could be improved.   Nutrition Goals Re-Evaluation:   Nutrition Goals Discharge (Final Nutrition Goals Re-Evaluation):   Psychosocial: Target Goals: Acknowledge presence or absence of significant depression and/or stress, maximize coping skills, provide positive support system. Participant is able to verbalize types and ability to use techniques and skills needed for reducing stress and depression.   Education: Stress, Anxiety, and Depression - Group  verbal and visual presentation to define topics covered.  Reviews how body is impacted by stress, anxiety, and depression.  Also discusses healthy ways to reduce stress and to treat/manage anxiety and depression.  Written material given at graduation.   Education: Sleep Hygiene -Provides group verbal and written instruction about how sleep can affect your health.  Define sleep hygiene, discuss sleep cycles and impact of sleep habits. Review good sleep hygiene tips.    Initial Review & Psychosocial Screening:  Initial Psych Review & Screening - 07/01/21 1302       Initial Review   Current issues with None Identified      Family Dynamics   Good Support System? Yes   2 nieces not local, sister in [place], neighbors     Barriers   Psychosocial barriers to participate in program There are no identifiable barriers or psychosocial needs.;The patient should benefit from training in stress management and relaxation.      Screening Interventions   Interventions Encouraged to exercise;To provide support and resources with identified psychosocial needs;Provide feedback about the scores to participant    Expected Outcomes Short Term goal: Utilizing psychosocial counselor, staff and physician to assist with identification of specific Stressors or current issues interfering  with healing process. Setting desired goal for each stressor or current issue identified.;Long Term Goal: Stressors or current issues are controlled or eliminated.;Long Term goal: The participant improves quality of Life and PHQ9 Scores as seen by post scores and/or verbalization of changes;Short Term goal: Identification and review with participant of any Quality of Life or Depression concerns found by scoring the questionnaire.             Quality of Life Scores:  Scores of 19 and below usually indicate a poorer quality of life in these areas.  A difference of  2-3 points is a clinically meaningful difference.  A difference of 2-3 points in the total score of the Quality of Life Index has been associated with significant improvement in overall quality of life, self-image, physical symptoms, and general health in studies assessing change in quality of life.  PHQ-9: Recent Review Flowsheet Data     Depression screen [facility] 2/9 07/06/2021 04/25/2021 01/10/2021 09/21/2020 07/26/2020   Decreased Interest 1 0 0 0 0   Down, Depressed, Hopeless 0 0 0 0 0   PHQ - 2 Score 1 0 0 0 0   Altered sleeping 2 - - - -   Tired, decreased energy 2 - - - -   Change in appetite 1 - - - -   Feeling bad or failure about yourself  0 - - - -   Trouble concentrating 1 - - - -   Moving slowly or fidgety/restless 1 - - - -   Suicidal thoughts 0 - - - -   PHQ-9 Score 8 - - - -   Difficult doing work/chores Somewhat difficult - - - -      Interpretation of Total Score  Total Score Depression Severity:  1-4 = Minimal depression, 5-9 = Mild depression, 10-14 = Moderate depression, 15-19 = Moderately severe depression, 20-27 = Severe depression   Psychosocial Evaluation and Intervention:  Psychosocial Evaluation - 07/01/21 1345       Psychosocial Evaluation & Interventions   Interventions Encouraged to exercise with the program and follow exercise prescription    Comments Fraser Din has been struggling 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 She was on the ventilator for 9  days and went through many weeks of rehab. She has regained most of her strength and living in independent living at the [facility]. The biggest symptom that hasn't resolved is her shortness of breath. 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 She has neices out of town that are supportive, her sister in [place], and a good neighborhood support group where she lives. She is hoping this program will help with her stamina and breathing.    Expected Outcomes Short: attend Pulmonary rehab for education and exercise. Long: develop and maintain positive self care habits.    Continue Psychosocial Services  Follow up required by staff             Psychosocial Re-Evaluation:   Psychosocial Discharge (Final Psychosocial Re-Evaluation):   Education: Education Goals: Education classes will be provided on a weekly basis, covering required topics. Participant will state understanding/return demonstration of topics presented.  Learning Barriers/Preferences:  Learning Barriers/Preferences - 07/01/21 1307       Learning Barriers/Preferences   Learning Barriers None    Learning Preferences None             General Pulmonary Education Topics:  Infection Prevention: - Provides verbal and written material to individual with discussion of infection control including proper hand washing and proper equipment cleaning during exercise session. Flowsheet Row Pulmonary Rehab from 07/06/2021 in [facility] Cardiac and Pulmonary Rehab  Education need identified 07/06/21  Date 07/06/21  Educator [place]  Instruction Review Code 1- Verbalizes Understanding       Falls Prevention: - Provides verbal and written material to individual with discussion of falls prevention  and safety. Flowsheet Row Pulmonary Rehab from 07/06/2021 in [facility] Cardiac and Pulmonary Rehab  Education need identified 07/06/21  Date 07/06/21  Educator [place]  Instruction Review Code 1- Verbalizes Understanding       Chronic Lung Disease Review: - Group verbal instruction with posters, models, PowerPoint presentations and videos,  to review new updates, new respiratory medications, new advancements in procedures and treatments. Providing information on websites and "800" numbers for continued self-education. Includes information about supplement oxygen, available portable oxygen systems, continuous and intermittent flow rates, oxygen safety, concentrators, and Medicare reimbursement for oxygen. Explanation of Pulmonary Drugs, including class, frequency, complications, importance of spacers, rinsing mouth after steroid MDI's, and proper cleaning methods for nebulizers. Review of basic lung anatomy and physiology related to function, structure, and complications of lung disease. Review of risk factors. Discussion about methods for diagnosing sleep apnea and types of masks and machines for OSA. Includes a review of the use of types of environmental controls: home humidity, furnaces, filters, dust mite/pet prevention, HEPA vacuums. Discussion about weather changes, air quality and the benefits of nasal washing. Instruction on Warning signs, infection symptoms, calling MD promptly, preventive modes, and value of vaccinations. Review of effective airway clearance, coughing and/or vibration techniques. Emphasizing that all should Create an Action Plan. Written material given at graduation. Flowsheet Row Pulmonary Rehab from 07/06/2021 in [facility] Cardiac and Pulmonary Rehab  Education need identified 07/06/21       AED/CPR: - Group verbal and written instruction with the use of models to demonstrate the basic use of the AED with the basic ABC's of resuscitation.    Anatomy and Cardiac Procedures: - Group  verbal and visual presentation and models provide  information about basic cardiac anatomy and function. Reviews the testing methods done to diagnose heart disease and the outcomes of the test results. Describes the treatment choices: Medical Management, Angioplasty, or Coronary Bypass Surgery for treating various heart conditions including Myocardial Infarction, Angina, Valve Disease, and Cardiac Arrhythmias.  Written material given at graduation.   Medication Safety: - Group verbal and visual instruction to review commonly prescribed medications for heart and lung disease. Reviews the medication, class of the drug, and side effects. Includes the steps to properly store meds and maintain the prescription regimen.  Written material given at graduation.   Other: -Provides group and verbal instruction on various topics (see comments)   Knowledge Questionnaire Score:  Knowledge Questionnaire Score - 07/06/21 1037       Knowledge Questionnaire Score   Pre Score 15/18: O2 sat, Albuterol              Core Components/Risk Factors/Patient Goals at Admission:  Personal Goals and Risk Factors at Admission - 07/06/21 1047       Core Components/Risk Factors/Patient Goals on Admission    Weight Management Yes;Weight Loss    Intervention Weight Management: Provide education and appropriate resources to help participant work on and attain dietary goals.;Weight Management: Develop a combined nutrition and exercise program designed to reach desired caloric intake, while maintaining appropriate intake of nutrient and fiber, sodium and fats, and appropriate energy expenditure required for the weight goal.;Weight Management/Obesity: Establish reasonable short term and long term weight goals.    Admit Weight 178 lb 14.4 oz (81.1 kg)    Goal Weight: Short Term 173 lb (78.5 kg)    Goal Weight: Long Term 168 lb (76.2 kg)    Expected Outcomes Short Term: Continue to assess and modify interventions until  short term weight is achieved;Long Term: Adherence to nutrition and physical activity/exercise program aimed toward attainment of established weight goal;Weight Loss: Understanding of general recommendations for a balanced deficit meal plan, which promotes 1-2 lb weight loss per week and includes a negative energy balance of [phone] kcal/d;Understanding of distribution of calorie intake throughout the day with the consumption of 4-5 meals/snacks;Understanding recommendations for meals to include 15-35% energy as protein, 25-35% energy from fat, 35-60% energy from carbohydrates, less than 200mg  of dietary cholesterol, 20-35 gm of total fiber daily    Hypertension Yes    Intervention Provide education on lifestyle modifcations including regular physical activity/exercise, weight management, moderate sodium restriction and increased consumption of fresh fruit, vegetables, and low fat dairy, alcohol moderation, and smoking cessation.;Monitor prescription use compliance.    Expected Outcomes Short Term: Continued assessment and intervention until BP is < 140/65mm HG in hypertensive participants. < 130/61mm HG in hypertensive participants with diabetes, heart failure or chronic kidney disease.;Long Term: Maintenance of blood pressure at goal levels.    Lipids Yes    Intervention Provide education and support for participant on nutrition & aerobic/resistive exercise along with prescribed medications to achieve LDL 70mg , HDL >40mg .    Expected Outcomes Short Term: Participant states understanding of desired cholesterol values and is compliant with medications prescribed. Participant is following exercise prescription and nutrition guidelines.;Long Term: Cholesterol controlled with medications as prescribed, with individualized exercise RX and with personalized nutrition plan. Value goals: LDL < 70mg , HDL > 40 mg.             Education:Diabetes - Individual verbal and written instruction to review  signs/symptoms of diabetes, desired ranges of glucose level fasting, after meals and with exercise. Acknowledge  that pre and post exercise glucose checks will be done for 3 sessions at entry of program.   Know Your Numbers and Heart Failure: - Group verbal and visual instruction to discuss disease risk factors for cardiac and pulmonary disease and treatment options.  Reviews associated critical values for Overweight/Obesity, Hypertension, Cholesterol, and Diabetes.  Discusses basics of heart failure: signs/symptoms and treatments.  Introduces Heart Failure Zone chart for action plan for heart failure.  Written material given at graduation.   Core Components/Risk Factors/Patient Goals Review:    Core Components/Risk Factors/Patient Goals at Discharge (Final Review):    ITP Comments:  ITP Comments     Row Name 07/01/21 1343 07/06/21 1032         ITP Comments Initial orientation completed. Diagnosis can be found in [facility] 8/22. EP orientation scheduled for Wednesday 10/5 at 9:30. Completed 6MWT and gym orientation. Initial ITP created and sent for review to Dr. Ottie Glazier, Medical Director.               Comments: Initial ITP

## 2021-07-12 ENCOUNTER — Other Ambulatory Visit: Payer: Self-pay

## 2021-07-12 DIAGNOSIS — R0600 Dyspnea, unspecified: Secondary | ICD-10-CM | POA: Diagnosis not present

## 2021-07-12 NOTE — Progress Notes (Signed)
Daily Session Note  Patient Details  Name: Alison Huffman MRN: [id number] Date of Birth: [birth year] Referring Provider:   Flowsheet Row Pulmonary Rehab from 07/06/2021 in [facility] Cardiac and Pulmonary Rehab  Referring Provider Ida Rogue MD       Encounter Date: 07/12/2021  Check In:  Session Check In - 07/12/21 0810       Check-In   Supervising physician immediately available to respond to emergencies See telemetry face sheet for immediately available ER MD    Location [facility]-Cardiac & Pulmonary Rehab    Staff Present Birdie Sons, MPA, RN;Jessica Luan Pulling, MA, RCEP, CCRP, CCET;Amanda Sommer, BA, ACSM CEP, Exercise Physiologist    Virtual Visit No    Medication changes reported     No    Fall or balance concerns reported    No    Warm-up and Cool-down Performed on first and last piece of equipment    Resistance Training Performed Yes    VAD Patient? No    PAD/SET Patient? No      Pain Assessment   Currently in Pain? No/denies                Social History   Tobacco Use  Smoking Status Never  Smokeless Tobacco Never  Tobacco Comments   07/22/2018 "smoked some in college; 1960s;  nothing since"    Goals Met:  Independence with exercise equipment Exercise tolerated well No report of concerns or symptoms today Strength training completed today  Goals Unmet:  Not Applicable  Comments: First full day of exercise!  Patient was oriented to gym and equipment including functions, settings, policies, and procedures.  Patient's individual exercise prescription and treatment plan were reviewed.  All starting workloads were established based on the results of the 6 minute walk test done at initial orientation visit.  The plan for exercise progression was also introduced and progression will be customized based on patient's performance and goals.     Dr. Emily Filbert is Medical Director for [place].  Dr. Ottie Glazier is Medical Director  for [facility] Pulmonary Rehabilitation.

## 2021-07-13 ENCOUNTER — Ambulatory Visit (INDEPENDENT_AMBULATORY_CARE_PROVIDER_SITE_OTHER): Payer: Medicare Other

## 2021-07-13 DIAGNOSIS — I441 Atrioventricular block, second degree: Secondary | ICD-10-CM

## 2021-07-14 ENCOUNTER — Other Ambulatory Visit: Payer: Self-pay

## 2021-07-14 DIAGNOSIS — R0600 Dyspnea, unspecified: Secondary | ICD-10-CM | POA: Diagnosis not present

## 2021-07-14 LAB — CUP PACEART REMOTE DEVICE CHECK
Battery Remaining Longevity: 97 mo
Battery Remaining Percentage: 77 %
Battery Voltage: 2.99 V
Brady Statistic AP VP Percent: 4.2 %
Brady Statistic AP VS Percent: 7.2 %
Brady Statistic AS VP Percent: 36 %
Brady Statistic AS VS Percent: 49 %
Brady Statistic RA Percent Paced: 6.6 %
Brady Statistic RV Percent Paced: 40 %
Date Time Interrogation Session: 20221012022926
Implantable Lead Implant Date: 20191022
Implantable Lead Implant Date: 20191022
Implantable Lead Location: 753859
Implantable Lead Location: 753860
Implantable Pulse Generator Implant Date: 20191022
Lead Channel Impedance Value: 510 Ohm
Lead Channel Impedance Value: 730 Ohm
Lead Channel Pacing Threshold Amplitude: 0.5 V
Lead Channel Pacing Threshold Amplitude: 0.75 V
Lead Channel Pacing Threshold Pulse Width: 0.5 ms
Lead Channel Pacing Threshold Pulse Width: 0.5 ms
Lead Channel Sensing Intrinsic Amplitude: 12 mV
Lead Channel Sensing Intrinsic Amplitude: 4.4 mV
Lead Channel Setting Pacing Amplitude: 1 V
Lead Channel Setting Pacing Amplitude: 2 V
Lead Channel Setting Pacing Pulse Width: 0.5 ms
Lead Channel Setting Sensing Sensitivity: 2 mV
Pulse Gen Model: 2272
Pulse Gen Serial Number: 9073848

## 2021-07-14 NOTE — Progress Notes (Signed)
Daily Session Note  Patient Details  Name: Alison Huffman MRN: [id number] Date of Birth: [birth year] Referring Provider:   Flowsheet Row Pulmonary Rehab from 07/06/2021 in [facility] Cardiac and Pulmonary Rehab  Referring Provider Ida Rogue MD       Encounter Date: 07/14/2021  Check In:  Session Check In - 07/14/21 0917       Check-In   Supervising physician immediately available to respond to emergencies See telemetry face sheet for immediately available ER MD    Location [facility]-Cardiac & Pulmonary Rehab    Staff Present Birdie Sons, MPA, RN    Virtual Visit No    Medication changes reported     No    Fall or balance concerns reported    No    Warm-up and Cool-down Performed on first and last piece of equipment    Resistance Training Performed Yes    VAD Patient? No    PAD/SET Patient? No      Pain Assessment   Currently in Pain? No/denies                Social History   Tobacco Use  Smoking Status Never  Smokeless Tobacco Never  Tobacco Comments   07/22/2018 "smoked some in college; 1960s;  nothing since"    Goals Met:  Independence with exercise equipment Exercise tolerated well No report of concerns or symptoms today Strength training completed today  Goals Unmet:  Not Applicable  Comments: Pt able to follow exercise prescription today without complaint.  Will continue to monitor for progression.    Dr. Emily Filbert is Medical Director for [place].  Dr. Ottie Glazier is Medical Director for [facility] Pulmonary Rehabilitation.

## 2021-07-19 ENCOUNTER — Other Ambulatory Visit: Payer: Self-pay

## 2021-07-19 DIAGNOSIS — R0600 Dyspnea, unspecified: Secondary | ICD-10-CM

## 2021-07-19 NOTE — Progress Notes (Signed)
Daily Session Note  Patient Details  Name: Alison Huffman MRN: [id number] Date of Birth: [birth year] Referring Provider:   Flowsheet Row Pulmonary Rehab from 07/06/2021 in [facility] Cardiac and Pulmonary Rehab  Referring Provider Ida Rogue MD       Encounter Date: 07/19/2021  Check In:  Session Check In - 07/19/21 0928       Check-In   Supervising physician immediately available to respond to emergencies See telemetry face sheet for immediately available ER MD    Location [facility]-Cardiac & Pulmonary Rehab    Staff Present Birdie Sons, MPA, RN;Jessica Luan Pulling, MA, RCEP, CCRP, CCET;Amanda Sommer, BA, ACSM CEP, Exercise Physiologist    Virtual Visit No    Medication changes reported     No    Fall or balance concerns reported    No    Warm-up and Cool-down Performed on first and last piece of equipment    Resistance Training Performed Yes    VAD Patient? No    PAD/SET Patient? No      Pain Assessment   Currently in Pain? No/denies                Social History   Tobacco Use  Smoking Status Never  Smokeless Tobacco Never  Tobacco Comments   07/22/2018 "smoked some in college; 1960s;  nothing since"    Goals Met:  Independence with exercise equipment Exercise tolerated well No report of concerns or symptoms today Strength training completed today  Goals Unmet:  Not Applicable  Comments: Pt able to follow exercise prescription today without complaint.  Will continue to monitor for progression.    Dr. Emily Filbert is Medical Director for [place].  Dr. Ottie Glazier is Medical Director for [facility] Pulmonary Rehabilitation.

## 2021-07-21 ENCOUNTER — Other Ambulatory Visit: Payer: Self-pay

## 2021-07-21 DIAGNOSIS — R0600 Dyspnea, unspecified: Secondary | ICD-10-CM

## 2021-07-21 NOTE — Progress Notes (Signed)
Remote pacemaker transmission.   

## 2021-07-21 NOTE — Progress Notes (Signed)
Daily Session Note  Patient Details  Name: Alison Huffman MRN: [id number] Date of Birth: 05-02-1941 Referring Provider:   Flowsheet Row Pulmonary Rehab from 07/06/2021 in [facility] Cardiac and Pulmonary Rehab  Referring Provider Ida Rogue MD       Encounter Date: 07/21/2021  Check In:  Session Check In - 07/21/21 0956       Check-In   Supervising physician immediately available to respond to emergencies See telemetry face sheet for immediately available ER MD    Location [facility]-Cardiac & Pulmonary Rehab    Staff Present Birdie Sons, MPA, RN;Jessica [place], MA, RCEP, CCRP, Rosalio Macadamia, BS, ACSM CEP, Exercise Physiologist;Amanda Oletta Darter, [place], ACSM CEP, Exercise Physiologist    Virtual Visit No    Medication changes reported     No    Fall or balance concerns reported    No    Warm-up and Cool-down Performed on first and last piece of equipment    Resistance Training Performed Yes    VAD Patient? No    PAD/SET Patient? No      Pain Assessment   Currently in Pain? No/denies                Social History   Tobacco Use  Smoking Status Never  Smokeless Tobacco Never  Tobacco Comments   07/22/2018 "smoked some in college; 1960s;  nothing since"    Goals Met:  Independence with exercise equipment Exercise tolerated well No report of concerns or symptoms today Strength training completed today  Goals Unmet:  Not Applicable  Comments: Pt able to follow exercise prescription today without complaint.  Will continue to monitor for progression.    Dr. Emily Filbert is Medical Director for [place].  Dr. Ottie Glazier is Medical Director for [facility] Pulmonary Rehabilitation.

## 2021-07-25 DIAGNOSIS — H47011 Ischemic optic neuropathy, right eye: Secondary | ICD-10-CM | POA: Diagnosis not present

## 2021-07-25 DIAGNOSIS — H2513 Age-related nuclear cataract, bilateral: Secondary | ICD-10-CM | POA: Diagnosis not present

## 2021-07-26 ENCOUNTER — Other Ambulatory Visit: Payer: Self-pay

## 2021-07-26 DIAGNOSIS — R0600 Dyspnea, unspecified: Secondary | ICD-10-CM

## 2021-07-26 NOTE — Progress Notes (Signed)
Daily Session Note  Patient Details  Name: Alison Huffman MRN: [id number] Date of Birth: 06-May-1941 Referring Provider:   Flowsheet Row Pulmonary Rehab from 07/06/2021 in [facility] Cardiac and Pulmonary Rehab  Referring Provider Ida Rogue MD       Encounter Date: 07/26/2021  Check In:  Session Check In - 07/26/21 0940       Check-In   Supervising physician immediately available to respond to emergencies See telemetry face sheet for immediately available ER MD    Location [facility]-Cardiac & Pulmonary Rehab    Staff Present Birdie Sons, MPA, RN;Melissa [place], RDN, Rowe Pavy, BA, ACSM CEP, Exercise Physiologist    Virtual Visit No    Medication changes reported     No    Fall or balance concerns reported    No    Warm-up and Cool-down Performed on first and last piece of equipment    Resistance Training Performed Yes    VAD Patient? No    PAD/SET Patient? No      Pain Assessment   Currently in Pain? No/denies                Social History   Tobacco Use  Smoking Status Never  Smokeless Tobacco Never  Tobacco Comments   07/22/2018 "smoked some in college; 1960s;  nothing since"    Goals Met:  Independence with exercise equipment Exercise tolerated well Personal goals reviewed No report of concerns or symptoms today Strength training completed today  Goals Unmet:  Not Applicable  Comments: Pt able to follow exercise prescription today without complaint.  Will continue to monitor for progression.    Dr. Emily Filbert is Medical Director for [place].  Dr. Ottie Glazier is Medical Director for [facility] Pulmonary Rehabilitation.

## 2021-07-28 ENCOUNTER — Other Ambulatory Visit: Payer: Self-pay

## 2021-07-28 DIAGNOSIS — R0600 Dyspnea, unspecified: Secondary | ICD-10-CM | POA: Diagnosis not present

## 2021-07-28 NOTE — Progress Notes (Signed)
Daily Session Note  Patient Details  Name: Alison Huffman MRN: [id number] Date of Birth: 28-Mar-1941 Referring Provider:   Flowsheet Row Pulmonary Rehab from 07/06/2021 in [facility] Cardiac and Pulmonary Rehab  Referring Provider Ida Rogue MD       Encounter Date: 07/28/2021  Check In:  Session Check In - 07/28/21 0914       Check-In   Supervising physician immediately available to respond to emergencies See telemetry face sheet for immediately available ER MD    Location [facility]-Cardiac & Pulmonary Rehab    Staff Present Birdie Sons, MPA, Mauricia Area, BS, ACSM CEP, Exercise Physiologist;Joseph Tessie Fass, [place]    Virtual Visit No    Medication changes reported     No    Fall or balance concerns reported    No    Warm-up and Cool-down Performed on first and last piece of equipment    Resistance Training Performed Yes    VAD Patient? No    PAD/SET Patient? No      Pain Assessment   Currently in Pain? No/denies                Social History   Tobacco Use  Smoking Status Never  Smokeless Tobacco Never  Tobacco Comments   07/22/2018 "smoked some in college; 1960s;  nothing since"    Goals Met:  Independence with exercise equipment Exercise tolerated well No report of concerns or symptoms today Strength training completed today  Goals Unmet:  Not Applicable  Comments: Pt able to follow exercise prescription today without complaint.  Will continue to monitor for progression.    Dr. Emily Filbert is Medical Director for [place].  Dr. Ottie Glazier is Medical Director for [facility] Pulmonary Rehabilitation.

## 2021-07-28 NOTE — Progress Notes (Signed)
Complete initial RD consultation

## 2021-08-02 ENCOUNTER — Encounter: Payer: Medicare Other | Attending: Cardiovascular Disease

## 2021-08-02 ENCOUNTER — Other Ambulatory Visit: Payer: Self-pay

## 2021-08-02 DIAGNOSIS — R0600 Dyspnea, unspecified: Secondary | ICD-10-CM | POA: Insufficient documentation

## 2021-08-02 NOTE — Progress Notes (Signed)
Daily Session Note  Patient Details  Name: Alison Huffman MRN: [id number] Date of Birth: 11-11-1940 Referring Provider:   Flowsheet Row Pulmonary Rehab from 07/06/2021 in [facility] Cardiac and Pulmonary Rehab  Referring Provider Ida Rogue MD       Encounter Date: 08/02/2021  Check In:  Session Check In - 08/02/21 0917       Check-In   Supervising physician immediately available to respond to emergencies See telemetry face sheet for immediately available ER MD    Location [facility]-Cardiac & Pulmonary Rehab    Staff Present Birdie Sons, MPA, RN;Jessica Luan Pulling, MA, RCEP, CCRP, CCET;Amanda Sommer, BA, ACSM CEP, Exercise Physiologist    Virtual Visit No    Medication changes reported     No    Fall or balance concerns reported    No    Warm-up and Cool-down Performed on first and last piece of equipment    Resistance Training Performed Yes    VAD Patient? No    PAD/SET Patient? No      Pain Assessment   Currently in Pain? No/denies                Social History   Tobacco Use  Smoking Status Never  Smokeless Tobacco Never  Tobacco Comments   07/22/2018 "smoked some in college; 1960s;  nothing since"    Goals Met:  Independence with exercise equipment Exercise tolerated well No report of concerns or symptoms today Strength training completed today  Goals Unmet:  Not Applicable  Comments: Pt able to follow exercise prescription today without complaint.  Will continue to monitor for progression.    Dr. Emily Filbert is Medical Director for [place].  Dr. Ottie Glazier is Medical Director for [facility] Pulmonary Rehabilitation.

## 2021-08-03 ENCOUNTER — Encounter: Payer: Self-pay | Admitting: *Deleted

## 2021-08-03 DIAGNOSIS — R0600 Dyspnea, unspecified: Secondary | ICD-10-CM | POA: Diagnosis not present

## 2021-08-03 NOTE — Progress Notes (Signed)
Daily Session Note  Patient Details  Name: Alison Huffman MRN: [id number] Date of Birth: 08/27/1941 Referring Provider:   Flowsheet Row Pulmonary Rehab from 07/06/2021 in [facility] Cardiac and Pulmonary Rehab  Referring Provider Ida Rogue MD       Encounter Date: 08/03/2021  Check In:  Session Check In - 08/03/21 1104       Check-In   Supervising physician immediately available to respond to emergencies See telemetry face sheet for immediately available ER MD    Location [facility]-Cardiac & Pulmonary Rehab    Staff Present Birdie Sons, MPA, RN;Joseph Tessie Fass, Lindell Spar, BA, ACSM CEP, Exercise Physiologist;Melissa [place], RDN, LDN    Virtual Visit No    Medication changes reported     No    Fall or balance concerns reported    No    Warm-up and Cool-down Performed on first and last piece of equipment    Resistance Training Performed Yes    VAD Patient? No    PAD/SET Patient? No      Pain Assessment   Currently in Pain? No/denies                Social History   Tobacco Use  Smoking Status Never  Smokeless Tobacco Never  Tobacco Comments   07/22/2018 "smoked some in college; 1960s;  nothing since"    Goals Met:  Independence with exercise equipment Exercise tolerated well No report of concerns or symptoms today Strength training completed today  Goals Unmet:  Not Applicable  Comments: Pt able to follow exercise prescription today without complaint.  Will continue to monitor for progression.  Reviewed home exercise with pt today.  Pt plans to walk and use gym in community for exercise.  The gym has a treadmill, elliptical, weights, and a NuStep.  Reviewed THR, pulse, RPE, sign and symptoms, pulse oximetery and when to call 911 or MD.  Also discussed weather considerations and indoor options.  Pt voiced understanding.   Dr. Emily Filbert is Medical Director for [place].  Dr. Ottie Glazier is Medical Director for  [facility] Pulmonary Rehabilitation.

## 2021-08-03 NOTE — Progress Notes (Signed)
Pulmonary Individual Treatment Plan  Patient Details  Name: Alison Huffman MRN: [id number] Date of Birth: 05/31/1941 Referring Provider:   Flowsheet Row Pulmonary Rehab from 07/06/2021 in [facility] Cardiac and Pulmonary Rehab  Referring Provider Ida Rogue MD       Initial Encounter Date:  Flowsheet Row Pulmonary Rehab from 07/06/2021 in [facility] Cardiac and Pulmonary Rehab  Date 07/06/21       Visit Diagnosis: Dyspnea, unspecified type  Patient's Home Medications on Admission:  Current Outpatient Medications:    acetaminophen (TYLENOL) 325 MG tablet, Take 325-650 mg by mouth every 6 (six) hours as needed for mild pain or headache. , Disp: , Rfl:    clobetasol ointment (TEMOVATE) 0.05 %, , Disp: , Rfl:    famotidine (PEPCID) 20 MG tablet, Take 1 tablet by mouth 2 (two) times daily. (Patient not taking: Reported on 07/01/2021), Disp: , Rfl:    hydrocerin (EUCERIN) CREA, Apply 1 application topically 2 (two) times daily. To bilateral feet., Disp: , Rfl: 0   ketoconazole (NIZORAL) 2 % cream, Apply 1 application topically daily. (Patient not taking: Reported on 07/01/2021), Disp: 15 g, Rfl: 0   losartan (COZAAR) 25 MG tablet, Take 1 tablet (25 mg total) by mouth daily., Disp: 90 tablet, Rfl: 3   methylcellulose (CITRUCEL) oral powder, , Disp: , Rfl:    metoprolol tartrate (LOPRESSOR) 50 MG tablet, Take 1 tablet (50 mg) by mouth 2 hours prior to your Cardiac CT (Patient not taking: No sig reported), Disp: 1 tablet, Rfl: 0   polyethylene glycol (MIRALAX / GLYCOLAX) 17 g packet, Take 17 g by mouth daily as needed for severe constipation., Disp: 14 each, Rfl: 0   tacrolimus (PROTOPIC) 0.1 % ointment, Apply topically 2 (two) times daily as needed (for psoriasis). , Disp: , Rfl:    Vitamin D, Ergocalciferol, 50 MCG (2000 UT) CAPS, Take by mouth., Disp: , Rfl:   Past Medical History: Past Medical History:  Diagnosis Date   Ankle pain, right    Atypical chest pain    a. 10/2018 MV: EF 57%. No  ischemia/infarct.   Coronary artery disease    Diastolic dysfunction    a. 04/2018 Echo: EF 55-60%, no rwma, Gr1 DD. Triv AI, mild MR. Mod dil LA. Nl RV fxn. PASP nl.   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 07/2018 s/p SJM Assurity MRI model PM2271 (Ser# [id number]).   Sleep apnea    "using nasal pilllows since ~ 12/2017" (07/22/2018)   TMJ (dislocation of temporomandibular joint)     Tobacco Use: Social History   Tobacco Use  Smoking Status Never  Smokeless Tobacco Never  Tobacco Comments   07/22/2018 "smoked some in college; 1960s;  nothing since"    Labs: Recent Review Flowsheet Data     Labs for ITP Cardiac and Pulmonary Rehab Latest Ref  Rng & Units 03/24/2020 04/03/2020 04/06/2020 04/11/2020 11/09/2020   Cholestrol 0 - 200 mg/dL - - - - 234(H)   LDLCALC 0 - 99 mg/dL - - - - 159(H)   LDLDIRECT mg/dL - - - - -   HDL >39.00 mg/dL - - - - 52.80   Trlycerides 0.0 - 149.0 mg/dL - - - - 112.0   Hemoglobin A1c 4.8 - 5.6 % 6.1 6.0(H) - - -   PHART 7.350 - 7.450 - 7.374 7.349(L) 7.470(H) -   PCO2ART 32.0 - 48.0 mmHg - 34.9 48.2(H) 38.4 -   HCO3 20.0 - 28.0 mmol/L - 20.4 26.3 27.9 -   TCO2 22 - 32 mmol/L - 21(L) 28 29 -   ACIDBASEDEF 0.0 - 2.0 mmol/L - 4.0(H) - - -   O2SAT % - 100.0 96.0 98.0 -        Pulmonary Assessment Scores:  Pulmonary Assessment Scores     Row Name 07/06/21 1036         ADL UCSD   ADL Phase Entry     SOB Score total 62     Rest 0      Walk 1     Stairs 4     Bath 1     Dress 1     Shop 4       CAT Score   CAT Score 9       mMRC Score   mMRC Score 1              UCSD: Self-administered rating of dyspnea associated with activities of daily living (ADLs) 6-point scale (0 = "not at all" to 5 = "maximal or unable to do because of breathlessness")  Scoring Scores range from 0 to 120.  Minimally important difference is 5 units  CAT: CAT can identify the health impairment of COPD patients and is better correlated with disease progression.  CAT has a scoring range of zero to 40. The CAT score is classified into four groups of low (less than 10), medium (10 - 20), high (21-30) and very high (31-40) based on the impact level of disease on health status. A CAT score over 10 suggests significant symptoms.  A worsening CAT score could be explained by an exacerbation, poor medication adherence, poor inhaler technique, or progression of COPD or comorbid conditions.  CAT MCID is 2 points  mMRC: mMRC (Modified Medical Research Council) Dyspnea Scale is used to assess the degree of baseline functional disability in patients of respiratory disease due to dyspnea. No minimal important difference is established. A decrease in score of 1 point or greater is considered a positive change.   Pulmonary Function Assessment:   Exercise Target Goals: Exercise Program Goal: Individual exercise prescription set using results from initial 6 min walk test and THRR while considering  patient's activity barriers and safety.   Exercise Prescription Goal: Initial exercise prescription builds to 30-45 minutes a day of aerobic activity, 2-3 days per week.  Home exercise guidelines will be given to patient during program as part of exercise prescription that the participant will acknowledge.  Education: Aerobic Exercise: - Group verbal and visual presentation on the components of exercise prescription. Introduces F.I.T.T principle from ACSM  for exercise prescriptions.  Reviews F.I.T.T. principles of aerobic exercise including progression. Written material given at graduation. Flowsheet Row Pulmonary Rehab from 07/28/2021 in [facility] Cardiac and Pulmonary Rehab  Date 07/28/21  Educator [facility]  Instruction Review Code 1- Verbalizes Understanding  Education: Resistance Exercise: - Group verbal and visual presentation on the components of exercise prescription. Introduces F.I.T.T principle from ACSM for exercise prescriptions  Reviews F.I.T.T. principles of resistance exercise including progression. Written material given at graduation.    Education: Exercise & Equipment Safety: - Individual verbal instruction and demonstration of equipment use and safety with use of the equipment. Flowsheet Row Pulmonary Rehab from 07/28/2021 in [facility] Cardiac and Pulmonary Rehab  Education need identified 07/06/21  Date 07/06/21  Educator [place]  Instruction Review Code 1- Verbalizes Understanding       Education: Exercise Physiology & General Exercise Guidelines: - Group verbal and written instruction with models to review the exercise physiology of the cardiovascular system and associated critical values. Provides general exercise guidelines with specific guidelines to those with heart or lung disease.  Flowsheet Row Pulmonary Rehab from 07/28/2021 in [facility] Cardiac and Pulmonary Rehab  Date 07/21/21  Educator [facility]  Instruction Review Code 1- Verbalizes Understanding       Education: Flexibility, Balance, Mind/Body Relaxation: - Group verbal and visual presentation with interactive activity on the components of exercise prescription. Introduces F.I.T.T principle from ACSM for exercise prescriptions. Reviews F.I.T.T. principles of flexibility and balance exercise training including progression. Also discusses the mind body connection.  Reviews various relaxation techniques to help reduce and manage stress (i.e. Deep breathing, progressive muscle  relaxation, and visualization). Balance handout provided to take home. Written material given at graduation.   Activity Barriers & Risk Stratification:  Activity Barriers & Cardiac Risk Stratification - 07/06/21 1039       Activity Barriers & Cardiac Risk Stratification   Activity Barriers Joint Problems;Back Problems;Other (comment);Deconditioning;Muscular Weakness;Shortness of Breath    Comments Guillian Barre, targets feet and legs bilaterally             6 Minute Walk:  6 Minute Walk     Row Name 07/06/21 1041         6 Minute Walk   Phase Initial     Distance 930 feet     Walk Time 6 minutes     # of Rest Breaks 0     MPH 1.76     METS 1.67     RPE 13     Perceived Dyspnea  2     VO2 Peak 5.86     Symptoms Yes (comment)     Comments SOB, fatigued legs     Resting HR 65 bpm     Resting BP 136/76     Resting Oxygen Saturation  95 %     Exercise Oxygen Saturation  during 6 min walk 94 %     Max Ex. HR 99 bpm     Max Ex. BP 144/72     2 Minute Post BP 134/74       Interval HR   1 Minute HR 89     2 Minute HR 96     3 Minute HR 99     4 Minute HR 95     5 Minute HR 98     6 Minute HR 99     2 Minute Post HR 61     Interval Heart Rate? Yes       Interval Oxygen   Interval Oxygen? Yes     Baseline Oxygen Saturation % 95 %     1 Minute Oxygen Saturation % 96 %     1 Minute Liters of Oxygen 0 L  RA     2  Minute Oxygen Saturation % 94 %     2 Minute Liters of Oxygen 0 L     3 Minute Oxygen Saturation % 95 %     3 Minute Liters of Oxygen 0 L     4 Minute Oxygen Saturation % 95 %     4 Minute Liters of Oxygen 0 L     5 Minute Oxygen Saturation % 95 %     5 Minute Liters of Oxygen 0 L     6 Minute Oxygen Saturation % 95 %     6 Minute Liters of Oxygen 0 L     2 Minute Post Oxygen Saturation % 96 %     2 Minute Post Liters of Oxygen 0 L             Oxygen Initial Assessment:  Oxygen Initial Assessment - 07/06/21 1035       Home Oxygen   Home  Oxygen Device None    Sleep Oxygen Prescription CPAP    Home Exercise Oxygen Prescription None    Home Resting Oxygen Prescription None    Compliance with Home Oxygen Use Yes      Initial 6 min Walk   Oxygen Used None      Program Oxygen Prescription   Program Oxygen Prescription None      Intervention   Short Term Goals To learn and understand importance of monitoring SPO2 with pulse oximeter and demonstrate accurate use of the pulse oximeter.;To learn and understand importance of maintaining oxygen saturations>88%;To learn and demonstrate proper pursed lip breathing techniques or other breathing techniques.     Long  Term Goals Verbalizes importance of monitoring SPO2 with pulse oximeter and return demonstration;Maintenance of O2 saturations>88%;Exhibits proper breathing techniques, such as pursed lip breathing or other method taught during program session             Oxygen Re-Evaluation:  Oxygen Re-Evaluation     Row Name 07/12/21 0812 07/26/21 0929           Program Oxygen Prescription   Program Oxygen Prescription None None        Home Oxygen   Home Oxygen Device None None      Sleep Oxygen Prescription CPAP CPAP      Home Exercise Oxygen Prescription None None      Home Resting Oxygen Prescription None None      Compliance with Home Oxygen Use Yes Yes        Goals/Expected Outcomes   Short Term Goals To learn and understand importance of monitoring SPO2 with pulse oximeter and demonstrate accurate use of the pulse oximeter.;To learn and understand importance of maintaining oxygen saturations>88%;To learn and demonstrate proper pursed lip breathing techniques or other breathing techniques.  To learn and understand importance of monitoring SPO2 with pulse oximeter and demonstrate accurate use of the pulse oximeter.;To learn and understand importance of maintaining oxygen saturations>88%;To learn and demonstrate proper pursed lip breathing techniques or other breathing  techniques.       Long  Term Goals Verbalizes importance of monitoring SPO2 with pulse oximeter and return demonstration;Maintenance of O2 saturations>88%;Exhibits proper breathing techniques, such as pursed lip breathing or other method taught during program session Verbalizes importance of monitoring SPO2 with pulse oximeter and return demonstration;Maintenance of O2 saturations>88%;Exhibits proper breathing techniques, such as pursed lip breathing or other method taught during program session      Comments Reviewed PLB technique with pt.  Talked about how it works and  it's importance in maintaining their exercise saturations. She reports no problems with the CPAP. She does not usually use PLB, but she has read about it before, reviewed how to do it. SHe has a pulse ox at home and reports it stays above 90 and has not been low.      Goals/Expected Outcomes Short: Become more profiecient at using PLB.   Long: Become independent at using PLB. ST: practice PLB LT: become independent using PLB               Oxygen Discharge (Final Oxygen Re-Evaluation):  Oxygen Re-Evaluation - 07/26/21 0929       Program Oxygen Prescription   Program Oxygen Prescription None      Home Oxygen   Home Oxygen Device None    Sleep Oxygen Prescription CPAP    Home Exercise Oxygen Prescription None    Home Resting Oxygen Prescription None    Compliance with Home Oxygen Use Yes      Goals/Expected Outcomes   Short Term Goals To learn and understand importance of monitoring SPO2 with pulse oximeter and demonstrate accurate use of the pulse oximeter.;To learn and understand importance of maintaining oxygen saturations>88%;To learn and demonstrate proper pursed lip breathing techniques or other breathing techniques.     Long  Term Goals Verbalizes importance of monitoring SPO2 with pulse oximeter and return demonstration;Maintenance of O2 saturations>88%;Exhibits proper breathing techniques, such as pursed lip  breathing or other method taught during program session    Comments She reports no problems with the CPAP. She does not usually use PLB, but she has read about it before, reviewed how to do it. SHe has a pulse ox at home and reports it stays above 90 and has not been low.    Goals/Expected Outcomes ST: practice PLB LT: become independent using PLB             Initial Exercise Prescription:  Initial Exercise Prescription - 07/06/21 1000       Date of Initial Exercise RX and Referring Provider   Date 07/06/21    Referring Provider Ida Rogue MD      Oxygen   Maintain Oxygen Saturation 88% or higher      Treadmill   MPH 1.6    Grade 0    Minutes 15    METs 2.23      Recumbant Bike   Level 1    RPM 60    Watts 15    Minutes 15    METs 1.6      NuStep   Level 1    SPM 80    Minutes 15    METs 1.6      Prescription Details   Frequency (times per week) 2    Duration Progress to 30 minutes of continuous aerobic without signs/symptoms of physical distress      Intensity   THRR 40-80% of Max Heartrate 94-124    Ratings of Perceived Exertion 11-13    Perceived Dyspnea 0-4      Progression   Progression Continue to progress workloads to maintain intensity without signs/symptoms of physical distress.      Resistance Training   Training Prescription Yes    Weight 3 lb    Reps 10-15             Perform Capillary Blood Glucose checks as needed.  Exercise Prescription Changes:   Exercise Prescription Changes     Row Name 07/06/21 1000 07/25/21 1500  Response to Exercise   Blood Pressure (Admit) 136/76 128/68      Blood Pressure (Exercise) 144/72 130/80      Blood Pressure (Exit) 134/74 124/68      Heart Rate (Admit) 65 bpm 79 bpm      Heart Rate (Exercise) 99 bpm 102 bpm      Heart Rate (Exit) 61 bpm 70 bpm      Oxygen Saturation (Admit) 95 % 96 %      Oxygen Saturation (Exercise) 94 % 92 %      Oxygen Saturation (Exit) 96 % 96 %       Rating of Perceived Exertion (Exercise) 13 15      Perceived Dyspnea (Exercise) 2 1      Symptoms SOB, fatigued legs --      Comments walk test results fourth session      Duration -- Continue with 30 min of aerobic exercise without signs/symptoms of physical distress.      Intensity -- THRR unchanged        Progression   Progression -- Continue to progress workloads to maintain intensity without signs/symptoms of physical distress.      Average METs -- 2        Resistance Training   Training Prescription -- Yes      Weight -- 3 lb      Reps -- 10-15        Biostep-RELP   Level -- 1      Minutes -- 15      METs -- 2        Track   Laps -- 20      Minutes -- 15      METs -- 2.09               Exercise Comments:   Exercise Comments     Row Name 07/12/21 0811           Exercise Comments First full day of exercise!  Patient was oriented to gym and equipment including functions, settings, policies, and procedures.  Patient's individual exercise prescription and treatment plan were reviewed.  All starting workloads were established based on the results of the 6 minute walk test done at initial orientation visit.  The plan for exercise progression was also introduced and progression will be customized based on patient's performance and goals.                Exercise Goals and Review:   Exercise Goals     Row Name 07/06/21 1047             Exercise Goals   Increase Physical Activity Yes       Intervention Provide advice, education, support and counseling about physical activity/exercise needs.;Develop an individualized exercise prescription for aerobic and resistive training based on initial evaluation findings, risk stratification, comorbidities and participant's personal goals.       Expected Outcomes Short Term: Attend rehab on a regular basis to increase amount of physical activity.;Long Term: Add in home exercise to make exercise part of routine and to  increase amount of physical activity.;Long Term: Exercising regularly at least 3-5 days a week.       Increase Strength and Stamina Yes       Intervention Provide advice, education, support and counseling about physical activity/exercise needs.;Develop an individualized exercise prescription for aerobic and resistive training based on initial evaluation findings, risk stratification, comorbidities and participant's personal goals.  Expected Outcomes Short Term: Increase workloads from initial exercise prescription for resistance, speed, and METs.;Short Term: Perform resistance training exercises routinely during rehab and add in resistance training at home;Long Term: Improve cardiorespiratory fitness, muscular endurance and strength as measured by increased METs and functional capacity (6MWT)       Able to understand and use rate of perceived exertion (RPE) scale Yes       Intervention Provide education and explanation on how to use RPE scale       Expected Outcomes Short Term: Able to use RPE daily in rehab to express subjective intensity level;Long Term:  Able to use RPE to guide intensity level when exercising independently       Able to understand and use Dyspnea scale Yes       Intervention Provide education and explanation on how to use Dyspnea scale       Expected Outcomes Short Term: Able to use Dyspnea scale daily in rehab to express subjective sense of shortness of breath during exertion;Long Term: Able to use Dyspnea scale to guide intensity level when exercising independently       Knowledge and understanding of Target Heart Rate Range (THRR) Yes       Intervention Provide education and explanation of THRR including how the numbers were predicted and where they are located for reference       Expected Outcomes Short Term: Able to state/look up THRR;Long Term: Able to use THRR to govern intensity when exercising independently;Short Term: Able to use daily as guideline for intensity in  rehab       Able to check pulse independently Yes       Intervention Provide education and demonstration on how to check pulse in carotid and radial arteries.;Review the importance of being able to check your own pulse for safety during independent exercise       Expected Outcomes Short Term: Able to explain why pulse checking is important during independent exercise;Long Term: Able to check pulse independently and accurately       Understanding of Exercise Prescription Yes       Intervention Provide education, explanation, and written materials on patient's individual exercise prescription       Expected Outcomes Short Term: Able to explain program exercise prescription;Long Term: Able to explain home exercise prescription to exercise independently                Exercise Goals Re-Evaluation :  Exercise Goals Re-Evaluation     Row Name 07/12/21 8119 07/25/21 1535 07/26/21 0931         Exercise Goal Re-Evaluation   Exercise Goals Review Increase Physical Activity;Able to understand and use rate of perceived exertion (RPE) scale;Knowledge and understanding of Target Heart Rate Range (THRR);Understanding of Exercise Prescription;Increase Strength and Stamina;Able to check pulse independently;Able to understand and use Dyspnea scale Increase Physical Activity;Increase Strength and Stamina Increase Physical Activity;Increase Strength and Stamina     Comments Reviewed RPE and dyspnea scales, THR and program prescription with pt today.  Pt voiced understanding and was given a copy of goals to take home. Alison Huffman has tolerated exercise well in her first sessions.  Oxygen stays in the 90s.She has reached 20 laps on the track. She will walk at home, not structured, but she would like to change that - sometimes she does such as saturday she went for a long walk. EP has not gone over home exercise yet.     Expected Outcomes Short: Use RPE daily to  regulate intensity. Long: Follow program prescription in THR.  Short:  attend consistently Long:  improve overall stamina ST: EP to go over home exercise LT: exercise outside of rehab              Discharge Exercise Prescription (Final Exercise Prescription Changes):  Exercise Prescription Changes - 07/25/21 1500       Response to Exercise   Blood Pressure (Admit) 128/68    Blood Pressure (Exercise) 130/80    Blood Pressure (Exit) 124/68    Heart Rate (Admit) 79 bpm    Heart Rate (Exercise) 102 bpm    Heart Rate (Exit) 70 bpm    Oxygen Saturation (Admit) 96 %    Oxygen Saturation (Exercise) 92 %    Oxygen Saturation (Exit) 96 %    Rating of Perceived Exertion (Exercise) 15    Perceived Dyspnea (Exercise) 1    Comments fourth session    Duration Continue with 30 min of aerobic exercise without signs/symptoms of physical distress.    Intensity THRR unchanged      Progression   Progression Continue to progress workloads to maintain intensity without signs/symptoms of physical distress.    Average METs 2      Resistance Training   Training Prescription Yes    Weight 3 lb    Reps 10-15      Biostep-RELP   Level 1    Minutes 15    METs 2      Track   Laps 20    Minutes 15    METs 2.09             Nutrition:  Target Goals: Understanding of nutrition guidelines, daily intake of sodium <1565m, cholesterol <2012m calories 30% from fat and 7% or less from saturated fats, daily to have 5 or more servings of fruits and vegetables.  Education: All About Nutrition: -Group instruction provided by verbal, written material, interactive activities, discussions, models, and posters to present general guidelines for heart healthy nutrition including fat, fiber, MyPlate, the role of sodium in heart healthy nutrition, utilization of the nutrition label, and utilization of this knowledge for meal planning. Follow up email sent as well. Written material given at graduation.   Biometrics:  Pre Biometrics - 07/06/21 1039       Pre  Biometrics   Height 5' 6.5" (1.689 m)    Weight 178 lb 14.4 oz (81.1 kg)    BMI (Calculated) 28.45    Single Leg Stand 3.97 seconds              Nutrition Therapy Plan and Nutrition Goals:  Nutrition Therapy & Goals - 07/28/21 0926       Nutrition Therapy   Diet Heart healthy, low Na    Protein (specify units) 65g    Fiber 25 grams    Whole Grain Foods 3 servings    Saturated Fats 12 max. grams    Fruits and Vegetables 8 servings/day    Sodium 1.5 grams      Personal Nutrition Goals   Nutrition Goal ST: practice mindful eating and paying attention to hunger cues, try including lunch or satiating snack before dinner to avoid eating way past point of fullness LT: prevent over eating at dinner.    Comments [place] [facility] [employer] Me[facility] per day- lunch or dinner. she feels she has a problem with overeating. She will make oatmeal or dry cereal (all bran or cheerios with lactaid, but usually skim milk) and sometimes eggs. She  may bring something home from dinner or have a sandwich (chciken salad, grilled cheese, tuna salad on whole wheat or white). She may also cook hamburger or salmon which she keeps in the fridge.Drinks: water and unsweet tea with meals. Meal includes entree with three vegetables, soup, and salad as well as dessert and she wants to stop ordering it especially since she does not enjoy it much. She feels these meals taste salty. Discussed heart healthy eatong and hunger/fullness. She would like to try eating before dinner - leftovers from the night before, a sandwich, crackers with peanut butter.      Intervention Plan   Intervention Prescribe, educate and counsel regarding individualized specific dietary modifications aiming towards targeted core components such as weight, hypertension, lipid management, diabetes, heart failure and other comorbidities.;Nutrition handout(s) given to patient.    Expected Outcomes Short Term Goal: Understand basic principles of dietary content,  such as calories, fat, sodium, cholesterol and nutrients.;Short Term Goal: A plan has been developed with personal nutrition goals set during dietitian appointment.;Long Term Goal: Adherence to prescribed nutrition plan.             Nutrition Assessments:  MEDIFICTS Score Key: ?70 Need to make dietary changes  40-70 Heart Healthy Diet ? 40 Therapeutic Level Cholesterol Diet  Flowsheet Row Pulmonary Rehab from 07/06/2021 in [facility] Cardiac and Pulmonary Rehab  Picture Your Plate Total Score on Admission 61      Picture Your Plate Scores: <83 Unhealthy dietary pattern with much room for improvement. 41-50 Dietary pattern unlikely to meet recommendations for good health and room for improvement. 51-60 More healthful dietary pattern, with some room for improvement.  >60 Healthy dietary pattern, although there may be some specific behaviors that could be improved.   Nutrition Goals Re-Evaluation:   Nutrition Goals Discharge (Final Nutrition Goals Re-Evaluation):   Psychosocial: Target Goals: Acknowledge presence or absence of significant depression and/or stress, maximize coping skills, provide positive support system. Participant is able to verbalize types and ability to use techniques and skills needed for reducing stress and depression.   Education: Stress, Anxiety, and Depression - Group verbal and visual presentation to define topics covered.  Reviews how body is impacted by stress, anxiety, and depression.  Also discusses healthy ways to reduce stress and to treat/manage anxiety and depression.  Written material given at graduation. Flowsheet Row Pulmonary Rehab from 07/28/2021 in [facility] Cardiac and Pulmonary Rehab  Date 07/14/21  Educator [facility]  Instruction Review Code 1- [employer] Understanding       Education: Sleep Hygiene -Provides group verbal and written instruction about how sleep can affect your health.  Define sleep hygiene, discuss sleep cycles and impact of sleep  habits. Review good sleep hygiene tips.    Initial Review & Psychosocial Screening:  Initial Psych Review & Screening - 07/01/21 1302       Initial Review   Current issues with None Identified      Family Dynamics   Good Support System? Yes   2 nieces not local, sister in [place], neighbors     Barriers   Psychosocial barriers to participate in program There are no identifiable barriers or psychosocial needs.;The patient should benefit from training in stress management and relaxation.      Screening Interventions   Interventions Encouraged to exercise;To provide support and resources with identified psychosocial needs;Provide feedback about the scores to participant    Expected Outcomes Short Term goal: Utilizing psychosocial counselor, staff and physician to assist with identification of  specific Stressors or current issues interfering with healing process. Setting desired goal for each stressor or current issue identified.;Long Term Goal: Stressors or current issues are controlled or eliminated.;Long Term goal: The participant improves quality of Life and PHQ9 Scores as seen by post scores and/or verbalization of changes;Short Term goal: Identification and review with participant of any Quality of Life or Depression concerns found by scoring the questionnaire.             Quality of Life Scores:  Scores of 19 and below usually indicate a poorer quality of life in these areas.  A difference of  2-3 points is a clinically meaningful difference.  A difference of 2-3 points in the total score of the Quality of Life Index has been associated with significant improvement in overall quality of life, self-image, physical symptoms, and general health in studies assessing change in quality of life.  PHQ-9: Recent Review Flowsheet Data     Depression screen [facility] 2/9 08/02/2021 07/06/2021 04/25/2021 01/10/2021 09/21/2020   Decreased Interest 0 1 0 0 0   Down, Depressed, Hopeless 0 0 0 0 0    PHQ - 2 Score 0 1 0 0 0   Altered sleeping 1 2 - - -   Tired, decreased energy 1 2 - - -   Change in appetite 1 1 - - -   Feeling bad or failure about yourself  0 0 - - -   Trouble concentrating 0 1 - - -   Moving slowly or fidgety/restless 0 1 - - -   Suicidal thoughts 0 0 - - -   PHQ-9 Score 3 8 - - -   Difficult doing work/chores Somewhat difficult Somewhat difficult - - -      Interpretation of Total Score  Total Score Depression Severity:  1-4 = Minimal depression, 5-9 = Mild depression, 10-14 = Moderate depression, 15-19 = Moderately severe depression, 20-27 = Severe depression   Psychosocial Evaluation and Intervention:  Psychosocial Evaluation - 07/01/21 1345       Psychosocial Evaluation & Interventions   Interventions Encouraged to exercise with the program and follow exercise prescription    Comments Alison 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 She was on the ventilator for 9 days and went through many weeks of rehab. She has regained most of her strength and living in independent living at the [facility]. The biggest symptom that hasn't resolved is her shortness of breath. 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 She has neices out of town that are supportive, her sister in [place], and a good neighborhood support group where she lives. She is hoping this program will help with her stamina and breathing.    Expected Outcomes Short: attend Pulmonary rehab for education and exercise. Long: develop and maintain positive self care habits.    Continue Psychosocial Services  Follow up required by staff             Psychosocial Re-Evaluation:  Psychosocial Re-Evaluation     [place] Name 07/26/21 [id number]             Psychosocial Re-Evaluation   Current issues with  Current Stress Concerns       Comments She reports not having any major stressors aside from her continuous shortness  of breath. She likes to bake - things like banana bread to help reduce stress. She has friends and relatives that she relies on for support. She reports sleeping well - 8 hours at least.       Expected Outcomes ST: continue with coping tools for stress and attending rehab LT: maintain positive attitude       Interventions Encouraged to attend Pulmonary Rehabilitation for the exercise       Continue Psychosocial Services  Follow up required by staff         Initial Review   Source of Stress Concerns Chronic Illness                Psychosocial Discharge (Final Psychosocial Re-Evaluation):  Psychosocial Re-Evaluation - 07/26/21 1610       Psychosocial Re-Evaluation   Current issues with Current Stress Concerns    Comments She reports not having any major stressors aside from her continuous shortness of breath. She likes to bake - things like banana bread to help reduce stress. She has friends and relatives that she relies on for support. She reports sleeping well - 8 hours at least.    Expected Outcomes ST: continue with coping tools for stress and attending rehab LT: maintain positive attitude    Interventions Encouraged to attend Pulmonary Rehabilitation for the exercise    Continue Psychosocial Services  Follow up required by staff      Initial Review   Source of Stress Concerns Chronic Illness             Education: Education Goals: Education classes will be provided on a weekly basis, covering required topics. Participant will state understanding/return demonstration of topics presented.  Learning Barriers/Preferences:  Learning Barriers/Preferences - 07/01/21 1307       Learning Barriers/Preferences   Learning Barriers None    Learning Preferences None             General Pulmonary Education Topics:  Infection Prevention: - Provides verbal and  written material to individual with discussion of infection control including proper hand washing and proper equipment cleaning during exercise session. Flowsheet Row Pulmonary Rehab from 07/28/2021 in [facility] Cardiac and Pulmonary Rehab  Education need identified 07/06/21  Date 07/06/21  Educator [place]  Instruction Review Code 1- Verbalizes Understanding       Falls Prevention: - Provides verbal and written material to individual with discussion of falls prevention and safety. Flowsheet Row Pulmonary Rehab from 07/28/2021 in [facility] Cardiac and Pulmonary Rehab  Education need identified 07/06/21  Date 07/06/21  Educator [place]  Instruction Review Code 1- Verbalizes Understanding       Chronic Lung Disease Review: - Group verbal instruction with posters, models, PowerPoint presentations and videos,  to review new updates, new respiratory medications, new advancements in procedures and treatments. Providing information on websites and "800" numbers for continued self-education. Includes information about supplement oxygen, available portable oxygen systems, continuous and intermittent flow rates, oxygen safety, concentrators, and Medicare reimbursement for oxygen. Explanation of Pulmonary Drugs, including class, frequency, complications, importance of spacers, rinsing mouth after steroid MDI's, and proper cleaning methods for nebulizers. Review of basic lung anatomy and physiology related to function, structure, and complications of lung disease. Review of risk factors. Discussion about methods for diagnosing sleep apnea and types of masks and machines for OSA. Includes a review of the use of types of environmental controls: home humidity, furnaces, filters, dust mite/pet prevention, HEPA vacuums. Discussion about weather changes, air quality and the benefits of  nasal washing. Instruction on Warning signs, infection symptoms, calling MD promptly, preventive modes, and value of vaccinations. Review of  effective airway clearance, coughing and/or vibration techniques. Emphasizing that all should Create an Action Plan. Written material given at graduation. Flowsheet Row Pulmonary Rehab from 07/28/2021 in [facility] Cardiac and Pulmonary Rehab  Education need identified 07/06/21       AED/CPR: - Group verbal and written instruction with the use of models to demonstrate the basic use of the AED with the basic ABC's of resuscitation.    Anatomy and Cardiac Procedures: - Group verbal and visual presentation and models provide information about basic cardiac anatomy and function. Reviews the testing methods done to diagnose heart disease and the outcomes of the test results. Describes the treatment choices: Medical Management, Angioplasty, or Coronary Bypass Surgery for treating various heart conditions including Myocardial Infarction, Angina, Valve Disease, and Cardiac Arrhythmias.  Written material given at graduation.   Medication Safety: - Group verbal and visual instruction to review commonly prescribed medications for heart and lung disease. Reviews the medication, class of the drug, and side effects. Includes the steps to properly store meds and maintain the prescription regimen.  Written material given at graduation.   Other: -Provides group and verbal instruction on various topics (see comments)   Knowledge Questionnaire Score:  Knowledge Questionnaire Score - 07/06/21 1037       Knowledge Questionnaire Score   Pre Score 15/18: O2 sat, Albuterol              Core Components/Risk Factors/Patient Goals at Admission:  Personal Goals and Risk Factors at Admission - 07/06/21 1047       Core Components/Risk Factors/Patient Goals on Admission    Weight Management Yes;Weight Loss    Intervention Weight Management: Provide education and appropriate resources to help participant work on and attain dietary goals.;Weight Management: Develop a combined nutrition and exercise program  designed to reach desired caloric intake, while maintaining appropriate intake of nutrient and fiber, sodium and fats, and appropriate energy expenditure required for the weight goal.;Weight Management/Obesity: Establish reasonable short term and long term weight goals.    Admit Weight 178 lb 14.4 oz (81.1 kg)    Goal Weight: Short Term 173 lb (78.5 kg)    Goal Weight: Long Term 168 lb (76.2 kg)    Expected Outcomes Short Term: Continue to assess and modify interventions until short term weight is achieved;Long Term: Adherence to nutrition and physical activity/exercise program aimed toward attainment of established weight goal;Weight Loss: Understanding of general recommendations for a balanced deficit meal plan, which promotes 1-2 lb weight loss per week and includes a negative energy balance of [phone] kcal/d;Understanding of distribution of calorie intake throughout the day with the consumption of 4-5 meals/snacks;Understanding recommendations for meals to include 15-35% energy as protein, 25-35% energy from fat, 35-60% energy from carbohydrates, less than 276m of dietary cholesterol, 20-35 gm of total fiber daily    Hypertension Yes    Intervention Provide education on lifestyle modifcations including regular physical activity/exercise, weight management, moderate sodium restriction and increased consumption of fresh fruit, vegetables, and low fat dairy, alcohol moderation, and smoking cessation.;Monitor prescription use compliance.    Expected Outcomes Short Term: Continued assessment and intervention until BP is < 140/975mHG in hypertensive participants. < 130/8088mG in hypertensive participants with diabetes, heart failure or chronic kidney disease.;Long Term: Maintenance of blood pressure at goal levels.    Lipids Yes    Intervention Provide education and support for participant  on nutrition & aerobic/resistive exercise along with prescribed medications to achieve LDL <93m, HDL >419m     Expected Outcomes Short Term: Participant states understanding of desired cholesterol values and is compliant with medications prescribed. Participant is following exercise prescription and nutrition guidelines.;Long Term: Cholesterol controlled with medications as prescribed, with individualized exercise RX and with personalized nutrition plan. Value goals: LDL < 7044mHDL > 40 mg.             Education:Diabetes - Individual verbal and written instruction to review signs/symptoms of diabetes, desired ranges of glucose level fasting, after meals and with exercise. Acknowledge that pre and post exercise glucose checks will be done for 3 sessions at entry of program.   Know Your Numbers and Heart Failure: - Group verbal and visual instruction to discuss disease risk factors for cardiac and pulmonary disease and treatment options.  Reviews associated critical values for Overweight/Obesity, Hypertension, Cholesterol, and Diabetes.  Discusses basics of heart failure: signs/symptoms and treatments.  Introduces Heart Failure Zone chart for action plan for heart failure.  Written material given at graduation.   Core Components/Risk Factors/Patient Goals Review:   Goals and Risk Factor Review     Row Name 07/26/21 0933             Core Components/Risk Factors/Patient Goals Review   Personal Goals Review Hypertension;Lipids;Heart Failure;Improve shortness of breath with ADL's       Review [employer] that her ADLs are harder due to her shortness of breath. 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 She denies any heart failure symptoms at this time. She checks her BP multiple times per week - 130/60-70. She has not met with RD yet. She is taking all her medication as directed and is not having any problems with them.       Expected Outcomes ST: continue to monitor vitals LT: manage ADLs with less shortness of breath                 Core Components/Risk Factors/Patient Goals at Discharge (Final Review):   Goals and Risk Factor Review - 07/26/21 0933       Core Components/Risk Factors/Patient Goals Review   Personal Goals Review Hypertension;Lipids;Heart Failure;Improve shortness of breath with ADL's    Review [employer] that her ADLs are harder due to her shortness of breath. 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 She denies any heart failure symptoms at this time. She checks her BP multiple times per week - 130/60-70. She has not met with RD yet. She is taking all her medication as directed and is not having any problems with them.    Expected Outcomes ST: continue to monitor vitals LT: manage ADLs with less shortness of breath             ITP Comments:  ITP Comments     Row Name 07/01/21 1343 07/06/21 1032 07/12/21 0811 07/28/21 0925 08/03/21 0708   ITP Comments Initial orientation completed. [facility] EP orientation scheduled for Wednesday 10/5 at 9:30. Completed 6MWT and gym orientation. Initial ITP created and sent for review to Dr. FuaOttie Glazieredical Director. First full day of exercise!  Patient was oriented to gym and equipment including functions, settings, policies, and procedures.  Patient's individual exercise prescription and treatment plan were reviewed.  All starting workloads were  established based on the results of the 6 minute walk test done at initial orientation visit.  The plan for exercise progression was also introduced and progression will be customized based on patient's performance and goals. Complete initial RD consultation 30 Day review completed. Medical Director ITP review done, changes made as directed, and signed approval by Medical Director.            Comments:  30 Day review completed. Medical Director ITP review done, changes made as directed, and signed approval by  Medical Director.

## 2021-08-09 ENCOUNTER — Other Ambulatory Visit: Payer: Self-pay

## 2021-08-09 DIAGNOSIS — R0600 Dyspnea, unspecified: Secondary | ICD-10-CM | POA: Diagnosis not present

## 2021-08-09 NOTE — Progress Notes (Signed)
Daily Session Note  Patient Details  Name: Alison Huffman MRN: [id number] Date of Birth: [birth year] Referring Provider:   Flowsheet Row Pulmonary Rehab from 07/06/2021 in [facility] Cardiac and Pulmonary Rehab  Referring Provider Ida Rogue MD       Encounter Date: 08/09/2021  Check In:  Session Check In - 08/09/21 0932       Check-In   Supervising physician immediately available to respond to emergencies See telemetry face sheet for immediately available ER MD    Location [facility]-Cardiac & Pulmonary Rehab    Staff Present Birdie Sons, MPA, RN;Amanda Oletta Darter, BA, ACSM CEP, Exercise Physiologist;Melissa Caiola, RDN, LDN    Virtual Visit No    Medication changes reported     No    Fall or balance concerns reported    No    Warm-up and Cool-down Performed on first and last piece of equipment    Resistance Training Performed Yes    VAD Patient? No    PAD/SET Patient? No      Pain Assessment   Currently in Pain? No/denies                Social History   Tobacco Use  Smoking Status Never  Smokeless Tobacco Never  Tobacco Comments   07/22/2018 "smoked some in college; 1960s;  nothing since"    Goals Met:  Independence with exercise equipment Exercise tolerated well No report of concerns or symptoms today Strength training completed today  Goals Unmet:  Not Applicable  Comments: Pt able to follow exercise prescription today without complaint.  Will continue to monitor for progression.    Dr. Emily Filbert is Medical Director for [place].  Dr. Ottie Glazier is Medical Director for [facility] Pulmonary Rehabilitation.

## 2021-08-11 ENCOUNTER — Other Ambulatory Visit: Payer: Self-pay

## 2021-08-11 DIAGNOSIS — R0600 Dyspnea, unspecified: Secondary | ICD-10-CM | POA: Diagnosis not present

## 2021-08-11 DIAGNOSIS — Z20828 Contact with and (suspected) exposure to other viral communicable diseases: Secondary | ICD-10-CM | POA: Diagnosis not present

## 2021-08-11 NOTE — Progress Notes (Signed)
Daily Session Note  Patient Details  Name: Alison Huffman MRN: [id number] Date of Birth: 08-15-1941 Referring Provider:   Flowsheet Row Pulmonary Rehab from 07/06/2021 in [facility] Cardiac and Pulmonary Rehab  Referring Provider Ida Rogue MD       Encounter Date: 08/11/2021  Check In:  Session Check In - 08/11/21 0837       Check-In   Supervising physician immediately available to respond to emergencies See telemetry face sheet for immediately available ER MD    Location [facility]-Cardiac & Pulmonary Rehab    Staff Present Birdie Sons, MPA, Mauricia Area, BS, ACSM CEP, Exercise Physiologist;Jessica Luan Pulling, MA, RCEP, CCRP, CCET    Virtual Visit No    Medication changes reported     No    Fall or balance concerns reported    No    Warm-up and Cool-down Performed on first and last piece of equipment    Resistance Training Performed Yes    VAD Patient? No    PAD/SET Patient? No      Pain Assessment   Currently in Pain? No/denies                Social History   Tobacco Use  Smoking Status Never  Smokeless Tobacco Never  Tobacco Comments   07/22/2018 "smoked some in college; 1960s;  nothing since"    Goals Met:  Independence with exercise equipment Exercise tolerated well No report of concerns or symptoms today Strength training completed today  Goals Unmet:  Not Applicable  Comments: Pt able to follow exercise prescription today without complaint.  Will continue to monitor for progression.    Dr. Emily Filbert is Medical Director for [place].  Dr. Ottie Glazier is Medical Director for [facility] Pulmonary Rehabilitation.

## 2021-08-15 ENCOUNTER — Ambulatory Visit: Payer: Medicare Other | Admitting: Family Medicine

## 2021-08-16 ENCOUNTER — Other Ambulatory Visit: Payer: Self-pay

## 2021-08-16 DIAGNOSIS — R0600 Dyspnea, unspecified: Secondary | ICD-10-CM | POA: Diagnosis not present

## 2021-08-16 NOTE — Progress Notes (Signed)
Daily Session Note  Patient Details  Name: Alison Huffman MRN: [id number] Date of Birth: May 30, 1941 Referring Provider:   Flowsheet Row Pulmonary Rehab from 07/06/2021 in [facility] Cardiac and Pulmonary Rehab  Referring Provider Ida Rogue MD       Encounter Date: 08/16/2021  Check In:  Session Check In - 08/16/21 0918       Check-In   Supervising physician immediately available to respond to emergencies See telemetry face sheet for immediately available ER MD    Location [facility]-Cardiac & Pulmonary Rehab    Staff Present Birdie Sons, MPA, RN;Amanda Oletta Darter, BA, ACSM CEP, Exercise Physiologist;Jessica Luan Pulling, MA, RCEP, CCRP, CCET    Virtual Visit No    Medication changes reported     No    Fall or balance concerns reported    No    Warm-up and Cool-down Performed on first and last piece of equipment    Resistance Training Performed Yes    VAD Patient? No    PAD/SET Patient? No      Pain Assessment   Currently in Pain? No/denies                Social History   Tobacco Use  Smoking Status Never  Smokeless Tobacco Never  Tobacco Comments   07/22/2018 "smoked some in college; 1960s;  nothing since"    Goals Met:  Independence with exercise equipment Exercise tolerated well No report of concerns or symptoms today Strength training completed today  Goals Unmet:  Not Applicable  Comments: Pt able to follow exercise prescription today without complaint.  Will continue to monitor for progression.    Dr. Emily Filbert is Medical Director for [place].  Dr. Ottie Glazier is Medical Director for [facility] Pulmonary Rehabilitation.

## 2021-08-18 ENCOUNTER — Other Ambulatory Visit: Payer: Self-pay

## 2021-08-18 DIAGNOSIS — R0600 Dyspnea, unspecified: Secondary | ICD-10-CM | POA: Diagnosis not present

## 2021-08-18 NOTE — Progress Notes (Signed)
Daily Session Note  Patient Details  Name: Alison Huffman MRN: [id number] Date of Birth: 1940/11/11 Referring Provider:   Flowsheet Row Pulmonary Rehab from 07/06/2021 in [facility] Cardiac and Pulmonary Rehab  Referring Provider Ida Rogue MD       Encounter Date: 08/18/2021  Check In:  Session Check In - 08/18/21 0820       Check-In   Supervising physician immediately available to respond to emergencies See telemetry face sheet for immediately available ER MD    Location [facility]-Cardiac & Pulmonary Rehab    Staff Present Birdie Sons, MPA, Nino Glow, MS, ASCM CEP, Exercise Physiologist;Calena Salem Amedeo Plenty, BS, ACSM CEP, Exercise Physiologist;Amanda Oletta Darter, BA, ACSM CEP, Exercise Physiologist    Virtual Visit No    Medication changes reported     No    Fall or balance concerns reported    No    Warm-up and Cool-down Performed on first and last piece of equipment    Resistance Training Performed Yes    VAD Patient? No    PAD/SET Patient? No      Pain Assessment   Currently in Pain? No/denies                Social History   Tobacco Use  Smoking Status Never  Smokeless Tobacco Never  Tobacco Comments   07/22/2018 "smoked some in college; 1960s;  nothing since"    Goals Met:  Independence with exercise equipment Exercise tolerated well No report of concerns or symptoms today Strength training completed today  Goals Unmet:  Not Applicable  Comments: Pt able to follow exercise prescription today without complaint.  Will continue to monitor for progression.    Dr. Emily Filbert is Medical Director for [place].  Dr. Ottie Glazier is Medical Director for [facility] Pulmonary Rehabilitation.

## 2021-08-23 ENCOUNTER — Other Ambulatory Visit: Payer: Self-pay

## 2021-08-23 DIAGNOSIS — R0600 Dyspnea, unspecified: Secondary | ICD-10-CM

## 2021-08-23 NOTE — Progress Notes (Signed)
Daily Session Note  Patient Details  Name: Alison Huffman MRN: [id number] Date of Birth: Aug 22, 1941 Referring Provider:   Flowsheet Row Pulmonary Rehab from 07/06/2021 in [facility] Cardiac and Pulmonary Rehab  Referring Provider Ida Rogue MD       Encounter Date: 08/23/2021  Check In:  Session Check In - 08/23/21 0911       Check-In   Supervising physician immediately available to respond to emergencies See telemetry face sheet for immediately available ER MD    Location [facility]-Cardiac & Pulmonary Rehab    Staff Present Birdie Sons, MPA, RN;Amanda Oletta Darter, BA, ACSM CEP, Exercise Physiologist;Melissa Caiola, RDN, LDN    Virtual Visit No    Medication changes reported     No    Fall or balance concerns reported    No    Warm-up and Cool-down Performed on first and last piece of equipment    Resistance Training Performed Yes    VAD Patient? No    PAD/SET Patient? No      Pain Assessment   Currently in Pain? No/denies                Social History   Tobacco Use  Smoking Status Never  Smokeless Tobacco Never  Tobacco Comments   07/22/2018 "smoked some in college; 1960s;  nothing since"    Goals Met:  Independence with exercise equipment Exercise tolerated well No report of concerns or symptoms today Strength training completed today  Goals Unmet:  Not Applicable  Comments: Pt able to follow exercise prescription today without complaint.  Will continue to monitor for progression.    Dr. Emily Filbert is Medical Director for [place].  Dr. Ottie Glazier is Medical Director for [facility] Pulmonary Rehabilitation.

## 2021-08-24 DIAGNOSIS — D225 Melanocytic nevi of trunk: Secondary | ICD-10-CM | POA: Diagnosis not present

## 2021-08-24 DIAGNOSIS — L400 Psoriasis vulgaris: Secondary | ICD-10-CM | POA: Diagnosis not present

## 2021-08-24 DIAGNOSIS — L900 Lichen sclerosus et atrophicus: Secondary | ICD-10-CM | POA: Diagnosis not present

## 2021-08-24 DIAGNOSIS — D2271 Melanocytic nevi of right lower limb, including hip: Secondary | ICD-10-CM | POA: Diagnosis not present

## 2021-08-24 DIAGNOSIS — L821 Other seborrheic keratosis: Secondary | ICD-10-CM | POA: Diagnosis not present

## 2021-08-24 DIAGNOSIS — D2239 Melanocytic nevi of other parts of face: Secondary | ICD-10-CM | POA: Diagnosis not present

## 2021-08-24 DIAGNOSIS — D2262 Melanocytic nevi of left upper limb, including shoulder: Secondary | ICD-10-CM | POA: Diagnosis not present

## 2021-08-29 DIAGNOSIS — I7091 Generalized atherosclerosis: Secondary | ICD-10-CM | POA: Diagnosis not present

## 2021-08-29 DIAGNOSIS — L84 Corns and callosities: Secondary | ICD-10-CM | POA: Diagnosis not present

## 2021-08-29 DIAGNOSIS — L603 Nail dystrophy: Secondary | ICD-10-CM | POA: Diagnosis not present

## 2021-08-30 ENCOUNTER — Other Ambulatory Visit: Payer: Self-pay

## 2021-08-30 DIAGNOSIS — R0600 Dyspnea, unspecified: Secondary | ICD-10-CM | POA: Diagnosis not present

## 2021-08-30 NOTE — Progress Notes (Signed)
Daily Session Note  Patient Details  Name: ANALI CABANILLA MRN: [id number] Date of Birth: 1941-09-16 Referring Provider:   Flowsheet Row Pulmonary Rehab from 07/06/2021 in [facility] Cardiac and Pulmonary Rehab  Referring Provider Ida Rogue MD       Encounter Date: 08/30/2021  Check In:  Session Check In - 08/30/21 0909       Check-In   Supervising physician immediately available to respond to emergencies See telemetry face sheet for immediately available ER MD    Location [facility]-Cardiac & Pulmonary Rehab    Staff Present Birdie Sons, MPA, RN;Jessica Luan Pulling, MA, RCEP, CCRP, CCET;Amanda Sommer, BA, ACSM CEP, Exercise Physiologist    Virtual Visit No    Medication changes reported     No    Fall or balance concerns reported    No    Warm-up and Cool-down Performed on first and last piece of equipment    Resistance Training Performed Yes    VAD Patient? No    PAD/SET Patient? No      Pain Assessment   Currently in Pain? No/denies                Social History   Tobacco Use  Smoking Status Never  Smokeless Tobacco Never  Tobacco Comments   07/22/2018 "smoked some in college; 1960s;  nothing since"    Goals Met:  Independence with exercise equipment Exercise tolerated well No report of concerns or symptoms today Strength training completed today  Goals Unmet:  Not Applicable  Comments: Pt able to follow exercise prescription today without complaint.  Will continue to monitor for progression.    Dr. Emily Filbert is Medical Director for [place].  Dr. Ottie Glazier is Medical Director for [facility] Pulmonary Rehabilitation.

## 2021-08-31 ENCOUNTER — Encounter: Payer: Self-pay | Admitting: *Deleted

## 2021-08-31 DIAGNOSIS — R0600 Dyspnea, unspecified: Secondary | ICD-10-CM

## 2021-08-31 NOTE — Progress Notes (Signed)
Pulmonary Individual Treatment Plan  Patient Details  Name: Alison Huffman MRN: [id number] Date of Birth: 05/31/1941 Referring Provider:   Flowsheet Row Pulmonary Rehab from 07/06/2021 in [facility] Cardiac and Pulmonary Rehab  Referring Provider Ida Rogue MD       Initial Encounter Date:  Flowsheet Row Pulmonary Rehab from 07/06/2021 in [facility] Cardiac and Pulmonary Rehab  Date 07/06/21       Visit Diagnosis: Dyspnea, unspecified type  Patient's Home Medications on Admission:  Current Outpatient Medications:    acetaminophen (TYLENOL) 325 MG tablet, Take 325-650 mg by mouth every 6 (six) hours as needed for mild pain or headache. , Disp: , Rfl:    clobetasol ointment (TEMOVATE) 0.05 %, , Disp: , Rfl:    famotidine (PEPCID) 20 MG tablet, Take 1 tablet by mouth 2 (two) times daily. (Patient not taking: Reported on 07/01/2021), Disp: , Rfl:    hydrocerin (EUCERIN) CREA, Apply 1 application topically 2 (two) times daily. To bilateral feet., Disp: , Rfl: 0   ketoconazole (NIZORAL) 2 % cream, Apply 1 application topically daily. (Patient not taking: Reported on 07/01/2021), Disp: 15 g, Rfl: 0   losartan (COZAAR) 25 MG tablet, Take 1 tablet (25 mg total) by mouth daily., Disp: 90 tablet, Rfl: 3   methylcellulose (CITRUCEL) oral powder, , Disp: , Rfl:    metoprolol tartrate (LOPRESSOR) 50 MG tablet, Take 1 tablet (50 mg) by mouth 2 hours prior to your Cardiac CT (Patient not taking: No sig reported), Disp: 1 tablet, Rfl: 0   polyethylene glycol (MIRALAX / GLYCOLAX) 17 g packet, Take 17 g by mouth daily as needed for severe constipation., Disp: 14 each, Rfl: 0   tacrolimus (PROTOPIC) 0.1 % ointment, Apply topically 2 (two) times daily as needed (for psoriasis). , Disp: , Rfl:    Vitamin D, Ergocalciferol, 50 MCG (2000 UT) CAPS, Take by mouth., Disp: , Rfl:   Past Medical History: Past Medical History:  Diagnosis Date   Ankle pain, right    Atypical chest pain    a. 10/2018 MV: EF 57%. No  ischemia/infarct.   Coronary artery disease    Diastolic dysfunction    a. 04/2018 Echo: EF 55-60%, no rwma, Gr1 DD. Triv AI, mild MR. Mod dil LA. Nl RV fxn. PASP nl.   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 07/2018 s/p SJM Assurity MRI model PM2271 (Ser# [id number]).   Sleep apnea    "using nasal pilllows since ~ 12/2017" (07/22/2018)   TMJ (dislocation of temporomandibular joint)     Tobacco Use: Social History   Tobacco Use  Smoking Status Never  Smokeless Tobacco Never  Tobacco Comments   07/22/2018 "smoked some in college; 1960s;  nothing since"    Labs: Recent Review Flowsheet Data     Labs for ITP Cardiac and Pulmonary Rehab Latest Ref  Rng & Units 03/24/2020 04/03/2020 04/06/2020 04/11/2020 11/09/2020   Cholestrol 0 - 200 mg/dL - - - - 234(H)   LDLCALC 0 - 99 mg/dL - - - - 159(H)   LDLDIRECT mg/dL - - - - -   HDL >39.00 mg/dL - - - - 52.80   Trlycerides 0.0 - 149.0 mg/dL - - - - 112.0   Hemoglobin A1c 4.8 - 5.6 % 6.1 6.0(H) - - -   PHART 7.350 - 7.450 - 7.374 7.349(L) 7.470(H) -   PCO2ART 32.0 - 48.0 mmHg - 34.9 48.2(H) 38.4 -   HCO3 20.0 - 28.0 mmol/L - 20.4 26.3 27.9 -   TCO2 22 - 32 mmol/L - 21(L) 28 29 -   ACIDBASEDEF 0.0 - 2.0 mmol/L - 4.0(H) - - -   O2SAT % - 100.0 96.0 98.0 -        Pulmonary Assessment Scores:  Pulmonary Assessment Scores     Row Name 07/06/21 1036         ADL UCSD   ADL Phase Entry     SOB Score total 62     Rest 0      Walk 1     Stairs 4     Bath 1     Dress 1     Shop 4       CAT Score   CAT Score 9       mMRC Score   mMRC Score 1              UCSD: Self-administered rating of dyspnea associated with activities of daily living (ADLs) 6-point scale (0 = "not at all" to 5 = "maximal or unable to do because of breathlessness")  Scoring Scores range from 0 to 120.  Minimally important difference is 5 units  CAT: CAT can identify the health impairment of COPD patients and is better correlated with disease progression.  CAT has a scoring range of zero to 40. The CAT score is classified into four groups of low (less than 10), medium (10 - 20), high (21-30) and very high (31-40) based on the impact level of disease on health status. A CAT score over 10 suggests significant symptoms.  A worsening CAT score could be explained by an exacerbation, poor medication adherence, poor inhaler technique, or progression of COPD or comorbid conditions.  CAT MCID is 2 points  mMRC: mMRC (Modified Medical Research Council) Dyspnea Scale is used to assess the degree of baseline functional disability in patients of respiratory disease due to dyspnea. No minimal important difference is established. A decrease in score of 1 point or greater is considered a positive change.   Pulmonary Function Assessment:   Exercise Target Goals: Exercise Program Goal: Individual exercise prescription set using results from initial 6 min walk test and THRR while considering  patient's activity barriers and safety.   Exercise Prescription Goal: Initial exercise prescription builds to 30-45 minutes a day of aerobic activity, 2-3 days per week.  Home exercise guidelines will be given to patient during program as part of exercise prescription that the participant will acknowledge.  Education: Aerobic Exercise: - Group verbal and visual presentation on the components of exercise prescription. Introduces F.I.T.T principle from ACSM  for exercise prescriptions.  Reviews F.I.T.T. principles of aerobic exercise including progression. Written material given at graduation. Flowsheet Row Pulmonary Rehab from 08/23/2021 in [facility] Cardiac and Pulmonary Rehab  Date 07/28/21  Educator [facility]  Instruction Review Code 1- Verbalizes Understanding  Education: Resistance Exercise: - Group verbal and visual presentation on the components of exercise prescription. Introduces F.I.T.T principle from ACSM for exercise prescriptions  Reviews F.I.T.T. principles of resistance exercise including progression. Written material given at graduation.    Education: Exercise & Equipment Safety: - Individual verbal instruction and demonstration of equipment use and safety with use of the equipment. Flowsheet Row Pulmonary Rehab from 08/23/2021 in [facility] Cardiac and Pulmonary Rehab  Education need identified 07/06/21  Date 07/06/21  Educator [place]  Instruction Review Code 1- Verbalizes Understanding       Education: Exercise Physiology & General Exercise Guidelines: - Group verbal and written instruction with models to review the exercise physiology of the cardiovascular system and associated critical values. Provides general exercise guidelines with specific guidelines to those with heart or lung disease.  Flowsheet Row Pulmonary Rehab from 08/23/2021 in [facility] Cardiac and Pulmonary Rehab  Date 07/21/21  Educator [facility]  Instruction Review Code 1- Verbalizes Understanding       Education: Flexibility, Balance, Mind/Body Relaxation: - Group verbal and visual presentation with interactive activity on the components of exercise prescription. Introduces F.I.T.T principle from ACSM for exercise prescriptions. Reviews F.I.T.T. principles of flexibility and balance exercise training including progression. Also discusses the mind body connection.  Reviews various relaxation techniques to help reduce and manage stress (i.e. Deep breathing, progressive muscle  relaxation, and visualization). Balance handout provided to take home. Written material given at graduation.   Activity Barriers & Risk Stratification:  Activity Barriers & Cardiac Risk Stratification - 07/06/21 1039       Activity Barriers & Cardiac Risk Stratification   Activity Barriers Joint Problems;Back Problems;Other (comment);Deconditioning;Muscular Weakness;Shortness of Breath    Comments Guillian Barre, targets feet and legs bilaterally             6 Minute Walk:  6 Minute Walk     Row Name 07/06/21 1041         6 Minute Walk   Phase Initial     Distance 930 feet     Walk Time 6 minutes     # of Rest Breaks 0     MPH 1.76     METS 1.67     RPE 13     Perceived Dyspnea  2     VO2 Peak 5.86     Symptoms Yes (comment)     Comments SOB, fatigued legs     Resting HR 65 bpm     Resting BP 136/76     Resting Oxygen Saturation  95 %     Exercise Oxygen Saturation  during 6 min walk 94 %     Max Ex. HR 99 bpm     Max Ex. BP 144/72     2 Minute Post BP 134/74       Interval HR   1 Minute HR 89     2 Minute HR 96     3 Minute HR 99     4 Minute HR 95     5 Minute HR 98     6 Minute HR 99     2 Minute Post HR 61     Interval Heart Rate? Yes       Interval Oxygen   Interval Oxygen? Yes     Baseline Oxygen Saturation % 95 %     1 Minute Oxygen Saturation % 96 %     1 Minute Liters of Oxygen 0 L  RA     2  Minute Oxygen Saturation % 94 %     2 Minute Liters of Oxygen 0 L     3 Minute Oxygen Saturation % 95 %     3 Minute Liters of Oxygen 0 L     4 Minute Oxygen Saturation % 95 %     4 Minute Liters of Oxygen 0 L     5 Minute Oxygen Saturation % 95 %     5 Minute Liters of Oxygen 0 L     6 Minute Oxygen Saturation % 95 %     6 Minute Liters of Oxygen 0 L     2 Minute Post Oxygen Saturation % 96 %     2 Minute Post Liters of Oxygen 0 L             Oxygen Initial Assessment:  Oxygen Initial Assessment - 07/06/21 1035       Home Oxygen   Home  Oxygen Device None    Sleep Oxygen Prescription CPAP    Home Exercise Oxygen Prescription None    Home Resting Oxygen Prescription None    Compliance with Home Oxygen Use Yes      Initial 6 min Walk   Oxygen Used None      Program Oxygen Prescription   Program Oxygen Prescription None      Intervention   Short Term Goals To learn and understand importance of monitoring SPO2 with pulse oximeter and demonstrate accurate use of the pulse oximeter.;To learn and understand importance of maintaining oxygen saturations>88%;To learn and demonstrate proper pursed lip breathing techniques or other breathing techniques.     Long  Term Goals Verbalizes importance of monitoring SPO2 with pulse oximeter and return demonstration;Maintenance of O2 saturations>88%;Exhibits proper breathing techniques, such as pursed lip breathing or other method taught during program session             Oxygen Re-Evaluation:  Oxygen Re-Evaluation     Row Name 07/12/21 2458 07/26/21 0929 08/11/21 0819         Program Oxygen Prescription   Program Oxygen Prescription None None None       Home Oxygen   Home Oxygen Device None None None     Sleep Oxygen Prescription CPAP CPAP CPAP     Home Exercise Oxygen Prescription None None None     Home Resting Oxygen Prescription None None None     Compliance with Home Oxygen Use Yes Yes Yes       Goals/Expected Outcomes   Short Term Goals To learn and understand importance of monitoring SPO2 with pulse oximeter and demonstrate accurate use of the pulse oximeter.;To learn and understand importance of maintaining oxygen saturations>88%;To learn and demonstrate proper pursed lip breathing techniques or other breathing techniques.  To learn and understand importance of monitoring SPO2 with pulse oximeter and demonstrate accurate use of the pulse oximeter.;To learn and understand importance of maintaining oxygen saturations>88%;To learn and demonstrate proper pursed lip  breathing techniques or other breathing techniques.  To learn and understand importance of monitoring SPO2 with pulse oximeter and demonstrate accurate use of the pulse oximeter.;To learn and understand importance of maintaining oxygen saturations>88%;To learn and demonstrate proper pursed lip breathing techniques or other breathing techniques.      Long  Term Goals Verbalizes importance of monitoring SPO2 with pulse oximeter and return demonstration;Maintenance of O2 saturations>88%;Exhibits proper breathing techniques, such as pursed lip breathing or other method taught during program session Verbalizes importance of monitoring SPO2 with pulse oximeter  and return demonstration;Maintenance of O2 saturations>88%;Exhibits proper breathing techniques, such as pursed lip breathing or other method taught during program session Verbalizes importance of monitoring SPO2 with pulse oximeter and return demonstration;Maintenance of O2 saturations>88%;Exhibits proper breathing techniques, such as pursed lip breathing or other method taught during program session     Comments Reviewed PLB technique with pt.  Talked about how it works and it's importance in maintaining their exercise saturations. She reports no problems with the CPAP. She does not usually use PLB, but she has read about it before, reviewed how to do it. SHe has a pulse ox at home and reports it stays above 90 and has not been low. She is doing well with her CPAP.  She has not felt that she has needed her PLB.  She was encouraged to continue to practice using it to help when she does need it.  Usually, when she feels short of breath, she will slow down to catch her breath.     Goals/Expected Outcomes Short: Become more profiecient at using PLB.   Long: Become independent at using PLB. ST: practice PLB LT: become independent using PLB ST: practice PLB LT: become independent using PLB              Oxygen Discharge (Final Oxygen Re-Evaluation):  Oxygen  Re-Evaluation - 08/11/21 0819       Program Oxygen Prescription   Program Oxygen Prescription None      Home Oxygen   Home Oxygen Device None    Sleep Oxygen Prescription CPAP    Home Exercise Oxygen Prescription None    Home Resting Oxygen Prescription None    Compliance with Home Oxygen Use Yes      Goals/Expected Outcomes   Short Term Goals To learn and understand importance of monitoring SPO2 with pulse oximeter and demonstrate accurate use of the pulse oximeter.;To learn and understand importance of maintaining oxygen saturations>88%;To learn and demonstrate proper pursed lip breathing techniques or other breathing techniques.     Long  Term Goals Verbalizes importance of monitoring SPO2 with pulse oximeter and return demonstration;Maintenance of O2 saturations>88%;Exhibits proper breathing techniques, such as pursed lip breathing or other method taught during program session    Comments She is doing well with her CPAP.  She has not felt that she has needed her PLB.  She was encouraged to continue to practice using it to help when she does need it.  Usually, when she feels short of breath, she will slow down to catch her breath.    Goals/Expected Outcomes ST: practice PLB LT: become independent using PLB             Initial Exercise Prescription:  Initial Exercise Prescription - 07/06/21 1000       Date of Initial Exercise RX and Referring Provider   Date 07/06/21    Referring Provider Ida Rogue MD      Oxygen   Maintain Oxygen Saturation 88% or higher      Treadmill   MPH 1.6    Grade 0    Minutes 15    METs 2.23      Recumbant Bike   Level 1    RPM 60    Watts 15    Minutes 15    METs 1.6      NuStep   Level 1    SPM 80    Minutes 15    METs 1.6      Prescription Details   Frequency (times  per week) 2    Duration Progress to 30 minutes of continuous aerobic without signs/symptoms of physical distress      Intensity   THRR 40-80% of Max  Heartrate 94-124    Ratings of Perceived Exertion 11-13    Perceived Dyspnea 0-4      Progression   Progression Continue to progress workloads to maintain intensity without signs/symptoms of physical distress.      Resistance Training   Training Prescription Yes    Weight 3 lb    Reps 10-15             Perform Capillary Blood Glucose checks as needed.  Exercise Prescription Changes:   Exercise Prescription Changes     Row Name 07/06/21 1000 07/25/21 1500 08/03/21 1100 08/09/21 1400 08/23/21 1400     Response to Exercise   Blood Pressure (Admit) 136/76 128/68 -- 120/62 118/62   Blood Pressure (Exercise) 144/72 130/80 -- 124/64 --   Blood Pressure (Exit) 134/74 124/68 -- 120/66 124/62   Heart Rate (Admit) 65 bpm 79 bpm -- 70 bpm 78 bpm   Heart Rate (Exercise) 99 bpm 102 bpm -- 96 bpm 87 bpm   Heart Rate (Exit) 61 bpm 70 bpm -- 72 bpm 81 bpm   Oxygen Saturation (Admit) 95 % 96 % -- 95 % 97 %   Oxygen Saturation (Exercise) 94 % 92 % -- 94 % 95 %   Oxygen Saturation (Exit) 96 % 96 % -- 96 % 95 %   Rating of Perceived Exertion (Exercise) 13 15 -- 13 12   Perceived Dyspnea (Exercise) 2 1 -- 2 1   Symptoms SOB, fatigued legs -- -- SOB --   Comments walk test results fourth session -- -- --   Duration -- Continue with 30 min of aerobic exercise without signs/symptoms of physical distress. -- Continue with 30 min of aerobic exercise without signs/symptoms of physical distress. Continue with 30 min of aerobic exercise without signs/symptoms of physical distress.   Intensity -- THRR unchanged -- THRR unchanged THRR unchanged     Progression   Progression -- Continue to progress workloads to maintain intensity without signs/symptoms of physical distress. -- Continue to progress workloads to maintain intensity without signs/symptoms of physical distress. Continue to progress workloads to maintain intensity without signs/symptoms of physical distress.   Average METs -- 2 -- 2.14 2      Resistance Training   Training Prescription -- Yes -- Yes Yes   Weight -- 3 lb -- 3 lb 3 lb   Reps -- 10-15 -- 10-15 10-15     Interval Training   Interval Training -- -- -- No No     Treadmill   MPH -- -- -- 1.6 --   Grade -- -- -- 0 --   Minutes -- -- -- 15 --   METs -- -- -- 2.23 --     NuStep   Level -- -- -- 3 --   Minutes -- -- -- 15 --     Biostep-RELP   Level -- 1 -- 1 1   Minutes -- 15 -- 15 15   METs -- 2 -- 2 2     Track   Laps -- 20 -- 30 26   Minutes -- 15 -- 15 15   METs -- 2.09 -- 2.63 2.41     Home Exercise Plan   Plans to continue exercise at -- -- Home (comment)  walking, community gym Home (comment)  walking, community  gym Home (comment)  walking, community gym   Frequency -- -- Add 2 additional days to program exercise sessions. Add 2 additional days to program exercise sessions. Add 2 additional days to program exercise sessions.   Initial Home Exercises Provided -- -- 08/03/21 08/03/21 08/03/21     Oxygen   Maintain Oxygen Saturation -- -- 88% or higher 88% or higher 88% or higher            Exercise Comments:   Exercise Comments     Row Name 07/12/21 0811           Exercise Comments First full day of exercise!  Patient was oriented to gym and equipment including functions, settings, policies, and procedures.  Patient's individual exercise prescription and treatment plan were reviewed.  All starting workloads were established based on the results of the 6 minute walk test done at initial orientation visit.  The plan for exercise progression was also introduced and progression will be customized based on patient's performance and goals.                Exercise Goals and Review:   Exercise Goals     Row Name 07/06/21 1047             Exercise Goals   Increase Physical Activity Yes       Intervention Provide advice, education, support and counseling about physical activity/exercise needs.;Develop an individualized exercise  prescription for aerobic and resistive training based on initial evaluation findings, risk stratification, comorbidities and participant's personal goals.       Expected Outcomes Short Term: Attend rehab on a regular basis to increase amount of physical activity.;Long Term: Add in home exercise to make exercise part of routine and to increase amount of physical activity.;Long Term: Exercising regularly at least 3-5 days a week.       Increase Strength and Stamina Yes       Intervention Provide advice, education, support and counseling about physical activity/exercise needs.;Develop an individualized exercise prescription for aerobic and resistive training based on initial evaluation findings, risk stratification, comorbidities and participant's personal goals.       Expected Outcomes Short Term: Increase workloads from initial exercise prescription for resistance, speed, and METs.;Short Term: Perform resistance training exercises routinely during rehab and add in resistance training at home;Long Term: Improve cardiorespiratory fitness, muscular endurance and strength as measured by increased METs and functional capacity (6MWT)       Able to understand and use rate of perceived exertion (RPE) scale Yes       Intervention Provide education and explanation on how to use RPE scale       Expected Outcomes Short Term: Able to use RPE daily in rehab to express subjective intensity level;Long Term:  Able to use RPE to guide intensity level when exercising independently       Able to understand and use Dyspnea scale Yes       Intervention Provide education and explanation on how to use Dyspnea scale       Expected Outcomes Short Term: Able to use Dyspnea scale daily in rehab to express subjective sense of shortness of breath during exertion;Long Term: Able to use Dyspnea scale to guide intensity level when exercising independently       Knowledge and understanding of Target Heart Rate Range (THRR) Yes        Intervention Provide education and explanation of THRR including how the numbers were predicted and where they are located for  reference       Expected Outcomes Short Term: Able to state/look up THRR;Long Term: Able to use THRR to govern intensity when exercising independently;Short Term: Able to use daily as guideline for intensity in rehab       Able to check pulse independently Yes       Intervention Provide education and demonstration on how to check pulse in carotid and radial arteries.;Review the importance of being able to check your own pulse for safety during independent exercise       Expected Outcomes Short Term: Able to explain why pulse checking is important during independent exercise;Long Term: Able to check pulse independently and accurately       Understanding of Exercise Prescription Yes       Intervention Provide education, explanation, and written materials on patient's individual exercise prescription       Expected Outcomes Short Term: Able to explain program exercise prescription;Long Term: Able to explain home exercise prescription to exercise independently                Exercise Goals Re-Evaluation :  Exercise Goals Re-Evaluation     Row Name 07/12/21 8677 07/25/21 1535 07/26/21 0931 08/03/21 1142 08/09/21 1446     Exercise Goal Re-Evaluation   Exercise Goals Review Increase Physical Activity;Able to understand and use rate of perceived exertion (RPE) scale;Knowledge and understanding of Target Heart Rate Range (THRR);Understanding of Exercise Prescription;Increase Strength and Stamina;Able to check pulse independently;Able to understand and use Dyspnea scale Increase Physical Activity;Increase Strength and Stamina Increase Physical Activity;Increase Strength and Stamina Increase Physical Activity;Increase Strength and Stamina;Able to understand and use rate of perceived exertion (RPE) scale;Able to understand and use Dyspnea scale;Knowledge and understanding of Target  Heart Rate Range (THRR);Able to check pulse independently;Understanding of Exercise Prescription Increase Physical Activity;Increase Strength and Stamina;Understanding of Exercise Prescription   Comments Reviewed RPE and dyspnea scales, THR and program prescription with pt today.  Pt voiced understanding and was given a copy of goals to take home. Alison Huffman has tolerated exercise well in her first sessions.  Oxygen stays in the 90s.She has reached 20 laps on the track. She will walk at home, not structured, but she would like to change that - sometimes she does such as saturday she went for a long walk. EP has not gone over home exercise yet. Reviewed home exercise with pt today.  Pt plans to walk and use gym in community for exercise.  The gym has a treadmill, elliptical, weights, and a NuStep.  Reviewed THR, pulse, RPE, sign and symptoms, pulse oximetery and when to call 911 or MD.  Also discussed weather considerations and indoor options.  Pt voiced understanding. Alison Huffman is doing well in rehab.  She is up to 30 laps on the track.  We will continue to monitor her progress.   Expected Outcomes Short: Use RPE daily to regulate intensity. Long: Follow program prescription in THR. Short:  attend consistently Long:  improve overall stamina ST: EP to go over home exercise LT: exercise outside of rehab Short: Start to use gym in complex at least one extra day a week Long: Exercise independently on off days Short: Increase BioStep Long: Continue to improve stamina.    [place] Name 08/11/21 3736 08/23/21 1441           Exercise Goal Re-Evaluation   Exercise Goals Review Increase Physical Activity;Increase Strength and Stamina;Understanding of Exercise Prescription Increase Physical Activity;Increase Strength and Stamina      Comments Alison Huffman  is doing well in rehab. She is doing at home too.  She is going to gym and using NuStep and weight machines.  She went once last week and not this week.  We will continue to encourage her to  exercise on her off days from rehab. Alison Huffman continues to do well and walks between 26 and 32 laps on the track.  Oxygen stays in the mid 90s on room air. We will continue to monitor.      Expected Outcomes Short: Continue to add in exercise on off days  Long: Continue to exercise independently Short:  maintain consistent exercise Long:  build overall stamina               Discharge Exercise Prescription (Final Exercise Prescription Changes):  Exercise Prescription Changes - 08/23/21 1400       Response to Exercise   Blood Pressure (Admit) 118/62    Blood Pressure (Exit) 124/62    Heart Rate (Admit) 78 bpm    Heart Rate (Exercise) 87 bpm    Heart Rate (Exit) 81 bpm    Oxygen Saturation (Admit) 97 %    Oxygen Saturation (Exercise) 95 %    Oxygen Saturation (Exit) 95 %    Rating of Perceived Exertion (Exercise) 12    Perceived Dyspnea (Exercise) 1    Duration Continue with 30 min of aerobic exercise without signs/symptoms of physical distress.    Intensity THRR unchanged      Progression   Progression Continue to progress workloads to maintain intensity without signs/symptoms of physical distress.    Average METs 2      Resistance Training   Training Prescription Yes    Weight 3 lb    Reps 10-15      Interval Training   Interval Training No      Biostep-RELP   Level 1    Minutes 15    METs 2      Track   Laps 26    Minutes 15    METs 2.41      Home Exercise Plan   Plans to continue exercise at Home (comment)   walking, community gym   Frequency Add 2 additional days to program exercise sessions.    Initial Home Exercises Provided 08/03/21      Oxygen   Maintain Oxygen Saturation 88% or higher             Nutrition:  Target Goals: Understanding of nutrition guidelines, daily intake of sodium <1542m, cholesterol <2093m calories 30% from fat and 7% or less from saturated fats, daily to have 5 or more servings of fruits and vegetables.  Education: All About  Nutrition: -Group instruction provided by verbal, written material, interactive activities, discussions, models, and posters to present general guidelines for heart healthy nutrition including fat, fiber, MyPlate, the role of sodium in heart healthy nutrition, utilization of the nutrition label, and utilization of this knowledge for meal planning. Follow up email sent as well. Written material given at graduation.   Biometrics:  Pre Biometrics - 07/06/21 1039       Pre Biometrics   Height 5' 6.5" (1.689 m)    Weight 178 lb 14.4 oz (81.1 kg)    BMI (Calculated) 28.45    Single Leg Stand 3.97 seconds              Nutrition Therapy Plan and Nutrition Goals:  Nutrition Therapy & Goals - 07/28/21 0926       Nutrition Therapy  Diet Heart healthy, low Na    Protein (specify units) 65g    Fiber 25 grams    Whole Grain Foods 3 servings    Saturated Fats 12 max. grams    Fruits and Vegetables 8 servings/day    Sodium 1.5 grams      Personal Nutrition Goals   Nutrition Goal ST: practice mindful eating and paying attention to hunger cues, try including lunch or satiating snack before dinner to avoid eating way past point of fullness LT: prevent over eating at dinner.    Comments [facility] [employer]. 1 Meal per day- lunch or dinner. she feels she has a problem with overeating. She will make oatmeal or dry cereal (all bran or cheerios with lactaid, but usually skim milk) and sometimes eggs. She may bring something home from dinner or have a sandwich (chciken salad, grilled cheese, tuna salad on whole wheat or white). She may also cook hamburger or salmon which she keeps in the fridge.Drinks: water and unsweet tea with meals. Meal includes entree with three vegetables, soup, and salad as well as dessert and she wants to stop ordering it especially since she does not enjoy it much. She feels these meals taste salty. Discussed heart healthy eatong and hunger/fullness. She would like to try  eating before dinner - leftovers from the night before, a sandwich, crackers with peanut butter.      Intervention Plan   Intervention Prescribe, educate and counsel regarding individualized specific dietary modifications aiming towards targeted core components such as weight, hypertension, lipid management, diabetes, heart failure and other comorbidities.;Nutrition handout(s) given to patient.    Expected Outcomes Short Term Goal: Understand basic principles of dietary content, such as calories, fat, sodium, cholesterol and nutrients.;Short Term Goal: A plan has been developed with personal nutrition goals set during dietitian appointment.;Long Term Goal: Adherence to prescribed nutrition plan.             Nutrition Assessments:  MEDIFICTS Score Key: ?70 Need to make dietary changes  40-70 Heart Healthy Diet ? 40 Therapeutic Level Cholesterol Diet  Flowsheet Row Pulmonary Rehab from 07/06/2021 in [facility] Re[facility]  Picture Your Plate Total Score on Admission 61      Picture Your Plate Scores: <60 Unhealthy dietary pattern with much room for improvement. 41-50 Dietary pattern unlikely to meet recommendations for good health and room for improvement. 51-60 More healthful dietary pattern, with some room for improvement.  >60 Healthy dietary pattern, although there may be some specific behaviors that could be improved.   Nutrition Goals Re-Evaluation:  Nutrition Goals Re-Evaluation     [place] Name 08/11/21 0814             Goals   Nutrition Goal ST: practice mindful eating and paying attention to hunger cues, try including lunch or satiating snack before dinner to avoid eating way past point of fullness LT: prevent over eating at dinner.       Comment Alison Huffman is doing well in rehab.  She admits to having room to improve.  Her weakness is desserts.  She eats once a day served and it comes with dessert.  SHe has been trying to take it home to snack on versus eating all at  once.  She is doing better about eating lunch and trying to focus on her hunger cues at night.  She continues to eat oatmeal or cereal for breakfast.       Expected Outcome Short: Continue to focus on hunger cues  Long: continue to focus on heart healthy eating                Nutrition Goals Discharge (Final Nutrition Goals Re-Evaluation):  Nutrition Goals Re-Evaluation - 08/11/21 0814       Goals   Nutrition Goal ST: practice mindful eating and paying attention to hunger cues, try including lunch or satiating snack before dinner to avoid eating way past point of fullness LT: prevent over eating at dinner.    Comment Alison Huffman is doing well in rehab.  She admits to having room to improve.  Her weakness is desserts.  She eats once a day served and it comes with dessert.  SHe has been trying to take it home to snack on versus eating all at once.  She is doing better about eating lunch and trying to focus on her hunger cues at night.  She continues to eat oatmeal or cereal for breakfast.    Expected Outcome Short: Continue to focus on hunger cues Long: continue to focus on heart healthy eating             Psychosocial: Target Goals: Acknowledge presence or absence of significant depression and/or stress, maximize coping skills, provide positive support system. Participant is able to verbalize types and ability to use techniques and skills needed for reducing stress and depression.   Education: Stress, Anxiety, and Depression - Group verbal and visual presentation to define topics covered.  Reviews how body is impacted by stress, anxiety, and depression.  Also discusses healthy ways to reduce stress and to treat/manage anxiety and depression.  Written material given at graduation. Flowsheet Row Pulmonary Rehab from 08/23/2021 in [facility] Cardiac and Pulmonary Rehab  Date 07/14/21  Educator [facility]  Instruction Review Code 1- [employer] Understanding       Education: Sleep Hygiene -Provides group  verbal and written instruction about how sleep can affect your health.  Define sleep hygiene, discuss sleep cycles and impact of sleep habits. Review good sleep hygiene tips.    Initial Review & Psychosocial Screening:  Initial Psych Review & Screening - 07/01/21 1302       Initial Review   Current issues with None Identified      Family Dynamics   Good Support System? Yes   2 nieces not local, sister in [place], neighbors     Barriers   Psychosocial barriers to participate in program There are no identifiable barriers or psychosocial needs.;The patient should benefit from training in stress management and relaxation.      Screening Interventions   Interventions Encouraged to exercise;To provide support and resources with identified psychosocial needs;Provide feedback about the scores to participant    Expected Outcomes Short Term goal: Utilizing psychosocial counselor, staff and physician to assist with identification of specific Stressors or current issues interfering with healing process. Setting desired goal for each stressor or current issue identified.;Long Term Goal: Stressors or current issues are controlled or eliminated.;Long Term goal: The participant improves quality of Life and PHQ9 Scores as seen by post scores and/or verbalization of changes;Short Term goal: Identification and review with participant of any Quality of Life or Depression concerns found by scoring the questionnaire.             Quality of Life Scores:  Scores of 19 and below usually indicate a poorer quality of life in these areas.  A difference of  2-3 points is a clinically meaningful difference.  A difference of 2-3 points in the total score of  the Quality of Life Index has been associated with significant improvement in overall quality of life, self-image, physical symptoms, and general health in studies assessing change in quality of life.  PHQ-9: Recent Review Flowsheet Data     Depression  screen [facility] 2/9 08/02/2021 07/06/2021 04/25/2021 01/10/2021 09/21/2020   Decreased Interest 0 1 0 0 0   Down, Depressed, Hopeless 0 0 0 0 0   PHQ - 2 Score 0 1 0 0 0   Altered sleeping 1 2 - - -   Tired, decreased energy 1 2 - - -   Change in appetite 1 1 - - -   Feeling bad or failure about yourself  0 0 - - -   Trouble concentrating 0 1 - - -   Moving slowly or fidgety/restless 0 1 - - -   Suicidal thoughts 0 0 - - -   PHQ-9 Score 3 8 - - -   Difficult doing work/chores Somewhat difficult Somewhat difficult - - -      Interpretation of Total Score  Total Score Depression Severity:  1-4 = Minimal depression, 5-9 = Mild depression, 10-14 = Moderate depression, 15-19 = Moderately severe depression, 20-27 = Severe depression   Psychosocial Evaluation and Intervention:  Psychosocial Evaluation - 07/01/21 1345       Psychosocial Evaluation & Interventions   Interventions Encouraged to exercise with the program and follow exercise prescription    Comments Alison 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 She was on the ventilator for 9 days and went through many weeks of rehab. She has regained most of her strength and living in independent living at the [facility]. The biggest symptom that hasn't resolved is her shortness of breath. 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 She has neices out of town that are supportive, her sister in [place], and a good neighborhood support group where she lives. She is hoping this program will help with her stamina and breathing.    Expected Outcomes Short: attend Pulmonary rehab for education and exercise. Long: develop and maintain positive self care habits.    Continue Psychosocial Services  Follow up required by staff             Psychosocial  Re-Evaluation:  Psychosocial Re-Evaluation     [place] Name 07/26/21 [phone] 08/11/21 7026           Psychosocial Re-Evaluation   Current issues with Current Stress Concerns None Identified      Comments She reports not having any major stressors aside from her continuous shortness of breath. She likes to bake - things like banana bread to help reduce stress. She has friends and relatives that she relies on for support. She reports sleeping well - 8 hours at least. Alison Huffman is doing well in rehab. Alison Huffman still does some baking and she is getting ready to gear up for bake sale next week.  She currently denies any major stressors and she continues to sleep well. She is using her CPAP.      Expected Outcomes ST: continue with coping tools for stress and attending rehab LT: maintain positive attitude short; Continue to exericse for mental boost Long: Conitnue to stay positive      Interventions Encouraged to attend Pulmonary Rehabilitation for the  exercise Encouraged to attend Pulmonary Rehabilitation for the exercise      Continue Psychosocial Services  Follow up required by staff --        Initial Review   Source of Stress Concerns Chronic Illness --               Psychosocial Discharge (Final Psychosocial Re-Evaluation):  Psychosocial Re-Evaluation - 08/11/21 0812       Psychosocial Re-Evaluation   Current issues with None Identified    Comments Alison Huffman is doing well in rehab. Alison Huffman still does some baking and she is getting ready to gear up for bake sale next week.  She currently denies any major stressors and she continues to sleep well. She is using her CPAP.    Expected Outcomes short; Continue to exericse for mental boost Long: Conitnue to stay positive    Interventions Encouraged to attend Pulmonary Rehabilitation for the exercise             Education: Education Goals: Education classes will be provided on a weekly basis, covering required topics. Participant will state understanding/return  demonstration of topics presented.  Learning Barriers/Preferences:  Learning Barriers/Preferences - 07/01/21 1307       Learning Barriers/Preferences   Learning Barriers None    Learning Preferences None             General Pulmonary Education Topics:  Infection Prevention: - Provides verbal and written material to individual with discussion of infection control including proper hand washing and proper equipment cleaning during exercise session. Flowsheet Row Pulmonary Rehab from 08/23/2021 in [facility] Cardiac and Pulmonary Rehab  Education need identified 07/06/21  Date 07/06/21  Educator [place]  Instruction Review Code 1- Verbalizes Understanding       Falls Prevention: - Provides verbal and written material to individual with discussion of falls prevention and safety. Flowsheet Row Pulmonary Rehab from 08/23/2021 in [facility] Cardiac and Pulmonary Rehab  Education need identified 07/06/21  Date 07/06/21  Educator [place]  Instruction Review Code 1- Verbalizes Understanding       Chronic Lung Disease Review: - Group verbal instruction with posters, models, PowerPoint presentations and videos,  to review new updates, new respiratory medications, new advancements in procedures and treatments. Providing information on websites and "800" numbers for continued self-education. Includes information about supplement oxygen, available portable oxygen systems, continuous and intermittent flow rates, oxygen safety, concentrators, and Medicare reimbursement for oxygen. Explanation of Pulmonary Drugs, including class, frequency, complications, importance of spacers, rinsing mouth after steroid MDI's, and proper cleaning methods for nebulizers. Review of basic lung anatomy and physiology related to function, structure, and complications of lung disease. Review of risk factors. Discussion about methods for diagnosing sleep apnea and types of masks and machines for OSA. Includes a review of the use of types  of environmental controls: home humidity, furnaces, filters, dust mite/pet prevention, HEPA vacuums. Discussion about weather changes, air quality and the benefits of nasal washing. Instruction on Warning signs, infection symptoms, calling MD promptly, preventive modes, and value of vaccinations. Review of effective airway clearance, coughing and/or vibration techniques. Emphasizing that all should Create an Action Plan. Written material given at graduation. Flowsheet Row Pulmonary Rehab from 08/23/2021 in [facility] Cardiac and Pulmonary Rehab  Education need identified 07/06/21       AED/CPR: - Group verbal and written instruction with the use of models to demonstrate the basic use of the AED with the basic ABC's of resuscitation.    Anatomy and Cardiac Procedures: - Group verbal  and visual presentation and models provide information about basic cardiac anatomy and function. Reviews the testing methods done to diagnose heart disease and the outcomes of the test results. Describes the treatment choices: Medical Management, Angioplasty, or Coronary Bypass Surgery for treating various heart conditions including Myocardial Infarction, Angina, Valve Disease, and Cardiac Arrhythmias.  Written material given at graduation.   Medication Safety: - Group verbal and visual instruction to review commonly prescribed medications for heart and lung disease. Reviews the medication, class of the drug, and side effects. Includes the steps to properly store meds and maintain the prescription regimen.  Written material given at graduation. Flowsheet Row Pulmonary Rehab from 08/23/2021 in [facility] Cardiac and Pulmonary Rehab  Date 08/23/21  Educator [place]  Instruction Review Code 1- Verbalizes Understanding       Other: -Provides group and verbal instruction on various topics (see comments)   Knowledge Questionnaire Score:  Knowledge Questionnaire Score - 07/06/21 1037       Knowledge Questionnaire Score   Pre  Score 15/18: O2 sat, Albuterol              Core Components/Risk Factors/Patient Goals at Admission:  Personal Goals and Risk Factors at Admission - 07/06/21 1047       Core Components/Risk Factors/Patient Goals on Admission    Weight Management Yes;Weight Loss    Intervention Weight Management: Provide education and appropriate resources to help participant work on and attain dietary goals.;Weight Management: Develop a combined nutrition and exercise program designed to reach desired caloric intake, while maintaining appropriate intake of nutrient and fiber, sodium and fats, and appropriate energy expenditure required for the weight goal.;Weight Management/Obesity: Establish reasonable short term and long term weight goals.    Admit Weight 178 lb 14.4 oz (81.1 kg)    Goal Weight: Short Term 173 lb (78.5 kg)    Goal Weight: Long Term 168 lb (76.2 kg)    Expected Outcomes Short Term: Continue to assess and modify interventions until short term weight is achieved;Long Term: Adherence to nutrition and physical activity/exercise program aimed toward attainment of established weight goal;Weight Loss: Understanding of general recommendations for a balanced deficit meal plan, which promotes 1-2 lb weight loss per week and includes a negative energy balance of [phone] kcal/d;Understanding of distribution of calorie intake throughout the day with the consumption of 4-5 meals/snacks;Understanding recommendations for meals to include 15-35% energy as protein, 25-35% energy from fat, 35-60% energy from carbohydrates, less than 211m of dietary cholesterol, 20-35 gm of total fiber daily    Hypertension Yes    Intervention Provide education on lifestyle modifcations including regular physical activity/exercise, weight management, moderate sodium restriction and increased consumption of fresh fruit, vegetables, and low fat dairy, alcohol moderation, and smoking cessation.;Monitor prescription use compliance.     Expected Outcomes Short Term: Continued assessment and intervention until BP is < 140/962mHG in hypertensive participants. < 130/8044mG in hypertensive participants with diabetes, heart failure or chronic kidney disease.;Long Term: Maintenance of blood pressure at goal levels.    Lipids Yes    Intervention Provide education and support for participant on nutrition & aerobic/resistive exercise along with prescribed medications to achieve LDL <59m55mDL >40mg4m Expected Outcomes Short Term: Participant states understanding of desired cholesterol values and is compliant with medications prescribed. Participant is following exercise prescription and nutrition guidelines.;Long Term: Cholesterol controlled with medications as prescribed, with individualized exercise RX and with personalized nutrition plan. Value goals: LDL < 59mg,85m > 40 mg.  Education:Diabetes - Individual verbal and written instruction to review signs/symptoms of diabetes, desired ranges of glucose level fasting, after meals and with exercise. Acknowledge that pre and post exercise glucose checks will be done for 3 sessions at entry of program.   Know Your Numbers and Heart Failure: - Group verbal and visual instruction to discuss disease risk factors for cardiac and pulmonary disease and treatment options.  Reviews associated critical values for Overweight/Obesity, Hypertension, Cholesterol, and Diabetes.  Discusses basics of heart failure: signs/symptoms and treatments.  Introduces Heart Failure Zone chart for action plan for heart failure.  Written material given at graduation.   Core Components/Risk Factors/Patient Goals Review:   Goals and Risk Factor Review     [place] Name 07/26/21 0933 08/11/21 0817           Core Components/Risk Factors/Patient Goals Review   Personal Goals Review Hypertension;Lipids;Heart Failure;Improve shortness of breath with ADL's Hypertension;Lipids;Heart Failure;Improve  shortness of breath with ADL's;Weight Management/Obesity      Review Alison Huffman reports that her ADLs are harder due to her shortness of breath. 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 She denies any heart failure symptoms at this time. She checks her BP multiple times per week - 130/60-70. She has not met with RD yet. She is taking all her medication as directed and is not having any problems with them. Alison Huffman is doing well in rehab. Her weight is down a pound since starting.  She is doing better with her breathing.  She denies any heart failure symptoms.  Her pressures are doing well and she is doing well with her meds.  Her breathing continues ot improve some days, but there are days where it does not feel like it at much.      Expected Outcomes ST: continue to monitor vitals LT: manage ADLs with less shortness of breath Short: continue to work on breathing Long: conitnue to monitor risk factors.               Core Components/Risk Factors/Patient Goals at Discharge (Final Review):   Goals and Risk Factor Review - 08/11/21 0817       Core Components/Risk Factors/Patient Goals Review   Personal Goals Review Hypertension;Lipids;Heart Failure;Improve shortness of breath with ADL's;Weight Management/Obesity    Review Alison Huffman is doing well in rehab. Her weight is down a pound since starting.  She is doing better with her breathing.  She denies any heart failure symptoms.  Her pressures are doing well and she is doing well with her meds.  Her breathing continues ot improve some days, but there are days where it does not feel like it at much.    Expected Outcomes Short: continue to work on breathing Long: conitnue to monitor risk factors.             ITP Comments:  ITP Comments     Row Name 07/01/21 1343 07/06/21 1032 07/12/21 0811 07/28/21 0925 08/03/21 0708   ITP Comments Initial orientation completed. Diagnosis can be found in [facility]  8/22. EP orientation scheduled for Wednesday 10/5 at 9:30. Completed 6MWT and gym orientation. Initial ITP created and sent for review to Dr. Ottie Glazier, Medical Director. First full day of exercise!  Patient was oriented to gym and equipment including functions, settings, policies, and procedures.  Patient's individual exercise prescription and treatment plan were reviewed.  All starting workloads were established based on the results of the 6 minute  walk test done at initial orientation visit.  The plan for exercise progression was also introduced and progression will be customized based on patient's performance and goals. Complete initial RD consultation 30 Day review completed. Medical Director ITP review done, changes made as directed, and signed approval by Medical Director.    [place] Name 08/31/21 0655           ITP Comments 30 Day review completed. Medical Director ITP review done, changes made as directed, and signed approval by Medical Director.                Comments:

## 2021-09-01 ENCOUNTER — Ambulatory Visit (INDEPENDENT_AMBULATORY_CARE_PROVIDER_SITE_OTHER): Payer: Medicare Other | Admitting: Family Medicine

## 2021-09-01 ENCOUNTER — Encounter: Payer: Self-pay | Admitting: Family Medicine

## 2021-09-01 ENCOUNTER — Encounter: Payer: Medicare Other | Attending: Cardiovascular Disease

## 2021-09-01 ENCOUNTER — Other Ambulatory Visit: Payer: Self-pay

## 2021-09-01 DIAGNOSIS — R600 Localized edema: Secondary | ICD-10-CM | POA: Diagnosis not present

## 2021-09-01 DIAGNOSIS — R0609 Other forms of dyspnea: Secondary | ICD-10-CM

## 2021-09-01 DIAGNOSIS — I5032 Chronic diastolic (congestive) heart failure: Secondary | ICD-10-CM | POA: Diagnosis not present

## 2021-09-01 DIAGNOSIS — Z7189 Other specified counseling: Secondary | ICD-10-CM | POA: Insufficient documentation

## 2021-09-01 DIAGNOSIS — I10 Essential (primary) hypertension: Secondary | ICD-10-CM | POA: Diagnosis not present

## 2021-09-01 DIAGNOSIS — R0600 Dyspnea, unspecified: Secondary | ICD-10-CM | POA: Insufficient documentation

## 2021-09-01 MED ORDER — FUROSEMIDE 20 MG PO TABS: 20.0000 mg | ORAL_TABLET | Freq: Every day | ORAL | 3 refills | Status: DC | PRN

## 2021-09-01 NOTE — Assessment & Plan Note (Signed)
The patient was advised that the risk of aspirin in her age group with no cardiovascular history would outweigh any benefit she would get from this.

## 2021-09-01 NOTE — Progress Notes (Signed)
Daily Session Note  Patient Details  Name: Alison Huffman MRN: [id number] Date of Birth: 1941-03-28 Referring Provider:   Flowsheet Row Pulmonary Rehab from 07/06/2021 in [facility] Cardiac and Pulmonary Rehab  Referring Provider Ida Rogue MD       Encounter Date: 09/01/2021  Check In:  Session Check In - 09/01/21 0921       Check-In   Supervising physician immediately available to respond to emergencies See telemetry face sheet for immediately available ER MD    Location [facility]-Cardiac & Pulmonary Rehab    Staff Present Earlean Shawl, BS, ACSM CEP, Exercise Physiologist;Amanda Oletta Darter, BA, ACSM CEP, Exercise Physiologist    Virtual Visit No    Medication changes reported     No    Fall or balance concerns reported    No    Warm-up and Cool-down Performed on first and last piece of equipment    Resistance Training Performed Yes    VAD Patient? No    PAD/SET Patient? No      Pain Assessment   Currently in Pain? No/denies                Social History   Tobacco Use  Smoking Status Never  Smokeless Tobacco Never  Tobacco Comments   07/22/2018 "smoked some in college; 1960s;  nothing since"    Goals Met:  Independence with exercise equipment Exercise tolerated well No report of concerns or symptoms today Strength training completed today  Goals Unmet:  Not Applicable  Comments: Pt able to follow exercise prescription today without complaint.  Will continue to monitor for progression.    Dr. Emily Filbert is Medical Director for [place].  Dr. Ottie Glazier is Medical Director for [facility] Pulmonary Rehabilitation.

## 2021-09-01 NOTE — Progress Notes (Signed)
Virtual Visit via telephone Note  This visit type was conducted due to national recommendations for restrictions regarding the COVID-19 pandemic (e.g. social distancing).  This format is felt to be most appropriate for this patient at this time.  All issues noted in this document were discussed and addressed.  No physical exam was performed (except for noted visual exam findings with Video Visits).   I connected with Alison Huffman today at  8:00 AM EST by telephone and verified that I am speaking with the correct person using two identifiers. Location patient: home Location provider: home office Persons participating in the virtual visit: patient, provider  I discussed the limitations, risks, security and privacy concerns of performing an evaluation and management service by telephone and the availability of in person appointments. I also discussed with the patient that there may be a patient responsible charge related to this service. The patient expressed understanding and agreed to proceed.  Interactive audio and video telecommunications were attempted between this provider and patient, however failed, due to patient having technical difficulties OR patient did not have access to video capability.  We continued and completed visit with audio only.   Reason for visit: f/u  HPI: HYPERTENSION Disease Monitoring Home BP Monitoring 120-130/60 Chest pain- no    Dyspnea- see below Medications Compliance-  taking losartan 25 mg daily.   Edema- see below BMET    Component Value Date/Time   NA 142 05/23/2021 1210   K 4.6 05/23/2021 1210   CL 105 05/23/2021 1210   CO2 27 05/23/2021 1210   GLUCOSE 85 05/23/2021 1210   GLUCOSE 86 02/09/2021 0955   BUN 13 05/23/2021 1210   CREATININE 0.64 05/23/2021 1210   CALCIUM 10.0 05/23/2021 1210   GFRNONAA >60 05/03/2020 0513   GFRAA >60 05/03/2020 0513   Dyspnea on exertion: This is an ongoing issue.  She had a coronary CT that revealed a low  calcium score.  There is no significant coronary disease noted.  She was referred for pulmonary rehab and notes she feels like she is doing better with that.  She notes ENT advised her that her vocal cord paralysis could cause some of the shortness of breath when she is talking.  Lower extremity edema: This is an ongoing issue.  She had prior lab work that did not reveal a specific cause.  She does have grade 1 diastolic dysfunction on prior echo.  She notes swelling in both legs.  Does go down somewhat overnight.  Occasionally gets tingling when her legs are swollen.  She does have compression stockings though she notes they do not permanently resolve the swelling.  The patient wonders if she should be on an 81 mg aspirin.   ROS: See pertinent positives and negatives per HPI.  Past Medical History:  Diagnosis Date   Ankle pain, right    Atypical chest pain    a. 10/2018 MV: EF 57%. No ischemia/infarct.   Coronary artery disease    Diastolic dysfunction    a. 04/2018 Echo: EF 55-60%, no rwma, Gr1 DD. Triv AI, mild MR. Mod dil LA. Nl RV fxn. PASP nl.   GERD (gastroesophageal reflux disease)    Guillain-Barre (HCC)    Headache    "dull one sometimes daily, at least weekly in last couple months" (07/22/2018)   Hyperlipidemia    Hypertension    Inflammatory neuropathy (HCC)    Joint pain in fingers of right hand    Lichen sclerosus    Osteopenia  Pneumonia 2012? 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 07/2018 s/p SJM Assurity MRI model PM2271 (Ser# [id number]).   Sleep apnea    "using nasal pilllows since ~ 12/2017" (07/22/2018)   TMJ (dislocation of temporomandibular joint)     Past Surgical History:  Procedure Laterality Date   APPENDECTOMY     BREAST CYST ASPIRATION  Bilateral    BREAST CYST EXCISION Right 1978   benign   COLONOSCOPY     COLONOSCOPY WITH PROPOFOL N/A 07/10/2019   Procedure: COLONOSCOPY WITH PROPOFOL;  Surgeon: Lollie Sails, MD;  Location: [facility] ENDOSCOPY;  Service: Endoscopy;  Laterality: N/A;   ESOPHAGOGASTRODUODENOSCOPY (EGD) WITH PROPOFOL N/A 07/10/2019   Procedure: ESOPHAGOGASTRODUODENOSCOPY (EGD) WITH PROPOFOL;  Surgeon: Lollie Sails, MD;  Location: [facility] ENDOSCOPY;  Service: Endoscopy;  Laterality: N/A;   HYSTEROSCOPY WITH D & C N/A 11/05/2018   Procedure: DILATATION AND CURETTAGE /HYSTEROSCOPY;  Surgeon: Homero Fellers, MD;  Location: [facility] ORS;  Service: Gynecology;  Laterality: N/A;   INSERT / REPLACE / REMOVE PACEMAKER     PACEMAKER IMPLANT N/A 07/23/2018   SJM Assurity 2272 implanted by Dr Rayann Heman for mobitz II second degree AV block   UTERINE POLYPS REMOVAL      Family History  Problem Relation Age of Onset   Stroke Mother    Atrial fibrillation Mother    Breast cancer Mother    Stroke Father    Breast cancer Maternal Aunt    Breast cancer Paternal Aunt     SOCIAL HX: nonsmoker   Current Outpatient Medications:    acetaminophen (TYLENOL) 325 MG tablet, Take 325-650 mg by mouth every 6 (six) hours as needed for mild pain or headache. , Disp: , Rfl:    clobetasol ointment (TEMOVATE) 0.05 %, , Disp: , Rfl:    famotidine (PEPCID) 20 MG tablet, Take 1 tablet by mouth 2 (two) times daily., Disp: , Rfl:    furosemide (LASIX) 20 MG tablet, Take 1 tablet (20 mg total) by mouth daily as needed for edema., Disp: 30 tablet, Rfl: 3   hydrocerin (EUCERIN) CREA, Apply 1 application topically 2 (two) times daily. To bilateral feet., Disp: , Rfl: 0   ketoconazole (NIZORAL) 2 % cream, Apply 1 application topically daily., Disp: 15 g, Rfl: 0   losartan (COZAAR) 25 MG tablet, Take 1 tablet (25 mg total) by mouth daily., Disp: 90 tablet, Rfl: 3   methylcellulose (CITRUCEL) oral powder, , Disp: , Rfl:    polyethylene glycol  (MIRALAX / GLYCOLAX) 17 g packet, Take 17 g by mouth daily as needed for severe constipation., Disp: 14 each, Rfl: 0   tacrolimus (PROTOPIC) 0.1 % ointment, Apply topically 2 (two) times daily as needed (for psoriasis). , Disp: , Rfl:    Vitamin D, Ergocalciferol, 50 MCG (2000 UT) CAPS, Take by mouth., Disp: , Rfl:   EXAM:  This was a telephone visit and thus no physical exam was completed.  ASSESSMENT AND PLAN:  Discussed the following assessment and plan:  Problem List Items Addressed This Visit     Bilateral leg edema    Likely venous insufficiency though did discuss that this could represent lymphedema or be related to her diastolic dysfunction.  We  will give her a trial of Lasix to see if that would be beneficial.  She was advised to monitor her blood pressure and for lightheadedness with this.  She will have lab work in 2 weeks.  She was also encouraged to use her compression stockings.  Discussed that the compression stockings would help reduce the swelling so that she would not end up with blisters or ulcers from excessive swelling.      Relevant Medications   furosemide (LASIX) 20 MG tablet   Chronic diastolic (congestive) heart failure (HCC)    Certainly this could be contributing to her swelling.  We will trial Lasix to see if that provides any benefit.      Relevant Medications   furosemide (LASIX) 20 MG tablet   Dyspnea on exertion    This is a chronic ongoing issue.  Likely related to deconditioning.  She will continue with pulmonary rehab.  Handicap placard form will be filled out for the patient.      Encounter for medication counseling    The patient was advised that the risk of aspirin in her age group with no cardiovascular history would outweigh any benefit she would get from this.      Hypertension    Adequate control for age.  She will continue losartan 25 mg once daily.  She will come in in 2 weeks for labs.      Relevant Medications   furosemide (LASIX)  20 MG tablet   Other Relevant Orders   Basic Metabolic Panel (BMET)    Return in about 2 weeks (around 09/15/2021) for LABS, 4 months pcp.   I discussed the assessment and treatment plan with the patient. The patient was provided an opportunity to ask questions and all were answered. The patient agreed with the plan and demonstrated an understanding of the instructions.   The patient was advised to call back or seek an in-person evaluation if the symptoms worsen or if the condition fails to improve as anticipated.  I provided 13 minutes of non-face-to-face time during this encounter.   Tommi Rumps, MD

## 2021-09-01 NOTE — Assessment & Plan Note (Signed)
This is a chronic ongoing issue.  Likely related to deconditioning.  She will continue with pulmonary rehab.  Handicap placard form will be filled out for the patient.

## 2021-09-01 NOTE — Assessment & Plan Note (Signed)
Certainly this could be contributing to her swelling.  We will trial Lasix to see if that provides any benefit.

## 2021-09-01 NOTE — Assessment & Plan Note (Signed)
Likely venous insufficiency though did discuss that this could represent lymphedema or be related to her diastolic dysfunction.  We will give her a trial of Lasix to see if that would be beneficial.  She was advised to monitor her blood pressure and for lightheadedness with this.  She will have lab work in 2 weeks.  She was also encouraged to use her compression stockings.  Discussed that the compression stockings would help reduce the swelling so that she would not end up with blisters or ulcers from excessive swelling.

## 2021-09-01 NOTE — Assessment & Plan Note (Signed)
Adequate control for age.  She will continue losartan 25 mg once daily.  She will come in in 2 weeks for labs.

## 2021-09-02 NOTE — Progress Notes (Signed)
Placed handicap form in sign basket.  Alison Huffman,cma

## 2021-09-05 ENCOUNTER — Encounter: Payer: Medicare Other | Admitting: *Deleted

## 2021-09-05 ENCOUNTER — Other Ambulatory Visit: Payer: Self-pay

## 2021-09-05 DIAGNOSIS — R0600 Dyspnea, unspecified: Secondary | ICD-10-CM | POA: Diagnosis not present

## 2021-09-05 NOTE — Progress Notes (Signed)
Daily Session Note  Patient Details  Name: LESBIA OTTAWAY MRN: [id number] Date of Birth: Apr 05, 1941 Referring Provider:   Flowsheet Row Pulmonary Rehab from 07/06/2021 in [facility] Cardiac and Pulmonary Rehab  Referring Provider Ida Rogue MD       Encounter Date: 09/05/2021  Check In:  Session Check In - 09/05/21 1012       Check-In   Supervising physician immediately available to respond to emergencies See telemetry face sheet for immediately available ER MD    Location [facility]-Cardiac & Pulmonary Rehab    Staff Present Heath Lark, RN, BSN, CCRP;Joseph [place], RCP,RRT,BSRT;Amanda [place], [place], ACSM CEP, Exercise Physiologist    Virtual Visit No    Medication changes reported     No    Fall or balance concerns reported    No    Warm-up and Cool-down Performed on first and last piece of equipment    Resistance Training Performed Yes    VAD Patient? No    PAD/SET Patient? No      Pain Assessment   Currently in Pain? No/denies                Social History   Tobacco Use  Smoking Status Never  Smokeless Tobacco Never  Tobacco Comments   07/22/2018 "smoked some in college; 1960s;  nothing since"    Goals Met:  Proper associated with RPD/PD & O2 Sat Independence with exercise equipment Personal goals reviewed No report of concerns or symptoms today  Goals Unmet:  Not Applicable  Comments: Pt able to follow exercise prescription today without complaint.  Will continue to monitor for progression.    Dr. Emily Filbert is Medical Director for [place].  Dr. Ottie Glazier is Medical Director for [facility] Pulmonary Rehabilitation.

## 2021-09-07 ENCOUNTER — Other Ambulatory Visit: Payer: Self-pay

## 2021-09-07 DIAGNOSIS — R0600 Dyspnea, unspecified: Secondary | ICD-10-CM | POA: Diagnosis not present

## 2021-09-07 NOTE — Progress Notes (Signed)
Daily Session Note  Patient Details  Name: AMERIS AKAMINE MRN: [id number] Date of Birth: 06-12-1941 Referring Provider:   Flowsheet Row Pulmonary Rehab from 07/06/2021 in [facility] Cardiac and Pulmonary Rehab  Referring Provider Ida Rogue MD       Encounter Date: 09/07/2021  Check In:  Session Check In - 09/07/21 0911       Check-In   Supervising physician immediately available to respond to emergencies See telemetry face sheet for immediately available ER MD    Location [facility]-Cardiac & Pulmonary Rehab    Staff Present Birdie Sons, MPA, RN;Melissa Conrad, RDN, Rowe Pavy, BA, ACSM CEP, Exercise Physiologist    Virtual Visit No    Medication changes reported     No    Fall or balance concerns reported    No    Warm-up and Cool-down Performed on first and last piece of equipment    Resistance Training Performed Yes    VAD Patient? No    PAD/SET Patient? No      Pain Assessment   Currently in Pain? No/denies                Social History   Tobacco Use  Smoking Status Never  Smokeless Tobacco Never  Tobacco Comments   07/22/2018 "smoked some in college; 1960s;  nothing since"    Goals Met:  Independence with exercise equipment Exercise tolerated well No report of concerns or symptoms today Strength training completed today  Goals Unmet:  Not Applicable  Comments: Pt able to follow exercise prescription today without complaint.  Will continue to monitor for progression.    Dr. Emily Filbert is Medical Director for [place].  Dr. Ottie Glazier is Medical Director for [facility] Pulmonary Rehabilitation.

## 2021-09-09 ENCOUNTER — Encounter: Payer: Medicare Other | Admitting: *Deleted

## 2021-09-09 ENCOUNTER — Other Ambulatory Visit: Payer: Self-pay

## 2021-09-09 DIAGNOSIS — R0600 Dyspnea, unspecified: Secondary | ICD-10-CM

## 2021-09-09 NOTE — Progress Notes (Signed)
Daily Session Note  Patient Details  Name: Alison Huffman MRN: [id number] Date of Birth: 10-29-1940 Referring Provider:   Flowsheet Row Pulmonary Rehab from 07/06/2021 in [facility] Cardiac and Pulmonary Rehab  Referring Provider Ida Rogue MD       Encounter Date: 09/09/2021  Check In:  Session Check In - 09/09/21 0930       Check-In   Supervising physician immediately available to respond to emergencies See telemetry face sheet for immediately available ER MD    Location [facility]-Cardiac & Pulmonary Rehab    Staff Present Renita Papa, RN BSN;Joseph [place], RCP,RRT,BSRT;Jessica [place], [place], RCEP, CCRP, CCET    Virtual Visit No    Medication changes reported     No    Fall or balance concerns reported    No    Warm-up and Cool-down Performed on first and last piece of equipment    Resistance Training Performed Yes    VAD Patient? No    PAD/SET Patient? No      Pain Assessment   Currently in Pain? No/denies                Social History   Tobacco Use  Smoking Status Never  Smokeless Tobacco Never  Tobacco Comments   07/22/2018 "smoked some in college; 1960s;  nothing since"    Goals Met:  Independence with exercise equipment Exercise tolerated well No report of concerns or symptoms today Strength training completed today  Goals Unmet:  Not Applicable  Comments: Pt able to follow exercise prescription today without complaint.  Will continue to monitor for progression.    Dr. Emily Filbert is Medical Director for [place].  Dr. Ottie Glazier is Medical Director for [facility] Pulmonary Rehabilitation.

## 2021-09-12 ENCOUNTER — Telehealth: Payer: Self-pay | Admitting: Family Medicine

## 2021-09-12 NOTE — Telephone Encounter (Signed)
Pt was advised at her appt on 12/1 that Dr. Caryl Bis would write her a long term disability placard. Pt is scheduled for labs on 12/20 and a 24m follow up on 04/14 at 9

## 2021-09-13 NOTE — Telephone Encounter (Signed)
I called the patient and informed her that her handicap placard was ready and she understood.  Adewale Pucillo,cma

## 2021-09-14 ENCOUNTER — Other Ambulatory Visit: Payer: Self-pay

## 2021-09-14 DIAGNOSIS — R0600 Dyspnea, unspecified: Secondary | ICD-10-CM | POA: Diagnosis not present

## 2021-09-14 NOTE — Progress Notes (Signed)
Daily Session Note  Patient Details  Name: Alison Huffman MRN: [id number] Date of Birth: [birth year] Referring Provider:   Flowsheet Row Pulmonary Rehab from 07/06/2021 in [facility] Cardiac and Pulmonary Rehab  Referring Provider Ida Rogue MD       Encounter Date: 09/14/2021  Check In:  Session Check In - 09/14/21 0914       Check-In   Supervising physician immediately available to respond to emergencies See telemetry face sheet for immediately available ER MD    Location [facility]-Cardiac & Pulmonary Rehab    Staff Present Birdie Sons, MPA, Elveria Rising, BA, ACSM CEP, Exercise Physiologist;Joseph Tessie Fass, [place]    Virtual Visit No    Medication changes reported     No    Fall or balance concerns reported    No    Warm-up and Cool-down Performed on first and last piece of equipment    Resistance Training Performed Yes    VAD Patient? No    PAD/SET Patient? No      Pain Assessment   Currently in Pain? No/denies                Social History   Tobacco Use  Smoking Status Never  Smokeless Tobacco Never  Tobacco Comments   07/22/2018 "smoked some in college; 1960s;  nothing since"    Goals Met:  Independence with exercise equipment Exercise tolerated well No report of concerns or symptoms today Strength training completed today  Goals Unmet:  Not Applicable  Comments: Pt able to follow exercise prescription today without complaint.  Will continue to monitor for progression.    Dr. Emily Filbert is Medical Director for [place].  Dr. Ottie Glazier is Medical Director for [facility] Pulmonary Rehabilitation.

## 2021-09-16 ENCOUNTER — Other Ambulatory Visit: Payer: Self-pay

## 2021-09-16 DIAGNOSIS — R0600 Dyspnea, unspecified: Secondary | ICD-10-CM | POA: Diagnosis not present

## 2021-09-16 NOTE — Progress Notes (Signed)
Daily Session Note  Patient Details  Name: Alison Huffman MRN: [id number] Date of Birth: 1940/11/18 Referring Provider:   Flowsheet Row Pulmonary Rehab from 07/06/2021 in [facility] Cardiac and Pulmonary Rehab  Referring Provider Ida Rogue MD       Encounter Date: 09/16/2021  Check In:  Session Check In - 09/16/21 0951       Check-In   Supervising physician immediately available to respond to emergencies See telemetry face sheet for immediately available ER MD    Location [facility]-Cardiac & Pulmonary Rehab    Staff Present Alberteen Sam, MA, RCEP, CCRP, CCET;Kymorah Korf, RN, BSN;Joseph [place], [place]    Virtual Visit No    Medication changes reported     No    Fall or balance concerns reported    No    Warm-up and Cool-down Performed on first and last piece of equipment    Resistance Training Performed Yes    VAD Patient? No    PAD/SET Patient? No      Pain Assessment   Currently in Pain? No/denies                Social History   Tobacco Use  Smoking Status Never  Smokeless Tobacco Never  Tobacco Comments   07/22/2018 "smoked some in college; 1960s;  nothing since"    Goals Met:  Proper associated with RPD/PD & O2 Sat Exercise tolerated well No report of concerns or symptoms today Strength training completed today  Goals Unmet:  Not Applicable  Comments: Pt able to follow exercise prescription today without complaint.  Will continue to monitor for progression.   Dr. Emily Filbert is Medical Director for [place].  Dr. Ottie Glazier is Medical Director for [facility] Pulmonary Rehabilitation.

## 2021-09-20 ENCOUNTER — Other Ambulatory Visit: Payer: Medicare Other

## 2021-09-21 ENCOUNTER — Other Ambulatory Visit: Payer: Self-pay

## 2021-09-21 DIAGNOSIS — R0600 Dyspnea, unspecified: Secondary | ICD-10-CM

## 2021-09-21 NOTE — Progress Notes (Signed)
Daily Session Note  Patient Details  Name: Alison Huffman MRN: [id number] Date of Birth: 1941/05/09 Referring Provider:   Flowsheet Row Pulmonary Rehab from 07/06/2021 in [facility] Cardiac and Pulmonary Rehab  Referring Provider Ida Rogue MD       Encounter Date: 09/21/2021  Check In:  Session Check In - 09/21/21 0918       Check-In   Supervising physician immediately available to respond to emergencies See telemetry face sheet for immediately available ER MD    Location [facility]-Cardiac & Pulmonary Rehab    Staff Present Birdie Sons, MPA, Elveria Rising, BA, ACSM CEP, Exercise Physiologist;Joseph Tessie Fass, [place]    Virtual Visit No    Medication changes reported     No    Fall or balance concerns reported    No    Warm-up and Cool-down Performed on first and last piece of equipment    Resistance Training Performed Yes    VAD Patient? No    PAD/SET Patient? No      Pain Assessment   Currently in Pain? No/denies                Social History   Tobacco Use  Smoking Status Never  Smokeless Tobacco Never  Tobacco Comments   07/22/2018 "smoked some in college; 1960s;  nothing since"    Goals Met:  Independence with exercise equipment Exercise tolerated well No report of concerns or symptoms today Strength training completed today  Goals Unmet:  Not Applicable  Comments: Pt able to follow exercise prescription today without complaint.  Will continue to monitor for progression.    Dr. Emily Filbert is Medical Director for [place].  Dr. Ottie Glazier is Medical Director for [facility] Pulmonary Rehabilitation.

## 2021-09-22 ENCOUNTER — Ambulatory Visit: Payer: Medicare Other

## 2021-09-22 ENCOUNTER — Other Ambulatory Visit: Payer: Self-pay

## 2021-09-22 ENCOUNTER — Other Ambulatory Visit (INDEPENDENT_AMBULATORY_CARE_PROVIDER_SITE_OTHER): Payer: Medicare Other

## 2021-09-22 DIAGNOSIS — I10 Essential (primary) hypertension: Secondary | ICD-10-CM | POA: Diagnosis not present

## 2021-09-22 LAB — BASIC METABOLIC PANEL
BUN: 13 mg/dL (ref 6–23)
CO2: 28 mEq/L (ref 19–32)
Calcium: 9.3 mg/dL (ref 8.4–10.5)
Chloride: 104 mEq/L (ref 96–112)
Creatinine, Ser: 0.58 mg/dL (ref 0.40–1.20)
GFR: 84.59 mL/min (ref >60.00)
Glucose, Bld: 79 mg/dL (ref 70–99)
Potassium: 3.9 mEq/L (ref 3.5–5.1)
Sodium: 137 mEq/L (ref 135–145)

## 2021-09-23 ENCOUNTER — Encounter: Payer: Medicare Other | Admitting: *Deleted

## 2021-09-23 DIAGNOSIS — R0600 Dyspnea, unspecified: Secondary | ICD-10-CM

## 2021-09-23 NOTE — Progress Notes (Signed)
Daily Session Note  Patient Details  Name: Alison Huffman MRN: [id number] Date of Birth: January 19, 1941 Referring Provider:   Flowsheet Row Pulmonary Rehab from 07/06/2021 in [facility] Cardiac and Pul[facility] Re[facility]  Referring Provider Ida Rogue MD       Encounter Date: 09/23/2021  Check In:  Session Check In - 09/23/21 0925       Check-In   Supervising physician immediately available to respond to emergencies See telemetry face sheet for immediately available ER MD    Location [facility]-Cardiac & Pulmonary Rehab    Staff Present Renita Papa, RN BSN;Joseph [place], RCP,RRT,BSRT;Jessica Goodwin, [place], RCEP, CCRP, CCET    Virtual Visit No    Medication changes reported     No    Fall or balance concerns reported    No    Warm-up and Cool-down Performed on first and last piece of equipment    Resistance Training Performed Yes    VAD Patient? No    PAD/SET Patient? No      Pain Assessment   Currently in Pain? No/denies                Social History   Tobacco Use  Smoking Status Never  Smokeless Tobacco Never  Tobacco Comments   07/22/2018 "smoked some in college; 1960s;  nothing since"    Goals Met:  Independence with exercise equipment Exercise tolerated well No report of concerns or symptoms today Strength training completed today  Goals Unmet:  Not Applicable  Comments: Pt able to follow exercise prescription today without complaint.  Will continue to monitor for progression.    Dr. Emily Filbert is Medical Director for [facility].  Dr. Ottie Glazier is Medical Director for [facility] Pulmonary Rehabilitation.

## 2021-09-28 ENCOUNTER — Encounter: Payer: Medicare Other | Admitting: *Deleted

## 2021-09-28 ENCOUNTER — Encounter: Payer: Self-pay | Admitting: *Deleted

## 2021-09-28 ENCOUNTER — Other Ambulatory Visit: Payer: Self-pay

## 2021-09-28 DIAGNOSIS — R0600 Dyspnea, unspecified: Secondary | ICD-10-CM

## 2021-09-28 NOTE — Progress Notes (Signed)
Daily Session Note  Patient Details  Name: Alison Huffman MRN: [id number] Date of Birth: [birth year] Referring Provider:   Flowsheet Row Pulmonary Rehab from 07/06/2021 in [facility] Cardiac [facility] Pulmonary Rehab  Referring Provider Ida Rogue MD       Encounter Date: 09/28/2021  Check In:  Session Check In - 09/28/21 0934       Check-In   Supervising physician immediately available to respond to emergencies See telemetry face sheet for immediately available ER MD    Location [facility]-Cardiac & Pulmonary Rehab    Staff Present Heath Lark, RN, BSN, CCRP;Melissa [place], RDN, LDN;Joseph [place], [place]    Virtual Visit No    Medication changes reported     No    Fall or balance concerns reported    No    Warm-up and Cool-down Performed on first and last piece of equipment    Resistance Training Performed Yes    VAD Patient? No    PAD/SET Patient? No      Pain Assessment   Currently in Pain? No/denies                Social History   Tobacco Use  Smoking Status Never  Smokeless Tobacco Never  Tobacco Comments   07/22/2018 "smoked some in college; 1960s;  nothing since"    Goals Met:  Proper associated with RPD/PD & O2 Sat Independence with exercise equipment Exercise tolerated well No report of concerns or symptoms today  Goals Unmet:  Not Applicable  Comments: Pt able to follow exercise prescription today without complaint.  Will continue to monitor for progression.    Dr. Emily Filbert is Medical Director for [facility].  Dr. Ottie Glazier is Medical Director for [facility] Pulmonary Rehabilitation.

## 2021-09-28 NOTE — Progress Notes (Signed)
Pulmonary Individual Treatment Plan  Patient Details  Name: [name] MRN: [id number] Date of Birth: Nov 18, 1940 Referring Provider:   Flowsheet Row Pulmonary Rehab from 07/06/2021 in [facility] Cardiac and Pulmonary Rehab  Referring Provider Ida Rogue MD       Initial Encounter Date:  Flowsheet Row Pulmonary Rehab from 07/06/2021 in [facility] Cardiac and Pulmonary Rehab  Date 07/06/21       Visit Diagnosis: Dyspnea, unspecified type  Patient's Home Medications on Admission:  Current Outpatient Medications:    acetaminophen (TYLENOL) 325 MG tablet, Take 325-650 mg by mouth every 6 (six) hours as needed for mild pain or headache. , Disp: , Rfl:    clobetasol ointment (TEMOVATE) 0.05 %, , Disp: , Rfl:    famotidine (PEPCID) 20 MG tablet, Take 1 tablet by mouth 2 (two) times daily., Disp: , Rfl:    furosemide (LASIX) 20 MG tablet, Take 1 tablet (20 mg total) by mouth daily as needed for edema., Disp: 30 tablet, Rfl: 3   hydrocerin (EUCERIN) CREA, Apply 1 application topically 2 (two) times daily. To bilateral feet., Disp: , Rfl: 0   ketoconazole (NIZORAL) 2 % cream, Apply 1 application topically daily., Disp: 15 g, Rfl: 0   losartan (COZAAR) 25 MG tablet, Take 1 tablet (25 mg total) by mouth daily., Disp: 90 tablet, Rfl: 3   methylcellulose (CITRUCEL) oral powder, , Disp: , Rfl:    polyethylene glycol (MIRALAX / GLYCOLAX) 17 g packet, Take 17 g by mouth daily as needed for severe constipation., Disp: 14 each, Rfl: 0   tacrolimus (PROTOPIC) 0.1 % ointment, Apply topically 2 (two) times daily as needed (for psoriasis). , Disp: , Rfl:    Vitamin D, Ergocalciferol, 50 MCG (2000 UT) CAPS, Take by mouth., Disp: , Rfl:   Past Medical History: Past Medical History:  Diagnosis Date   Ankle pain, right    Atypical chest pain    a. 10/2018 MV: EF 57%. No ischemia/infarct.   Coronary artery disease    Diastolic dysfunction    a. 04/2018 Echo: EF 55-60%, no rwma, Gr1 DD. Triv AI, mild MR. Mod  dil LA. Nl RV fxn. PASP nl.   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 07/2018 s/p SJM Assurity MRI model PM2271 (Ser# [id number]).   Sleep apnea    "using nasal pilllows since ~ 12/2017" (07/22/2018)   TMJ (dislocation of temporomandibular joint)     Tobacco Use: Social History   Tobacco Use  Smoking Status Never  Smokeless Tobacco Never  Tobacco Comments   07/22/2018 "smoked some in college; 1960s;  nothing since"    Labs: Recent Review Flowsheet Data     Labs for ITP Cardiac and Pulmonary Rehab Latest Ref Rng & Units 03/24/2020 04/03/2020 04/06/2020 04/11/2020 11/09/2020   Cholestrol 0 - 200 mg/dL - - - - 234(H)  LDLCALC 0 - 99 mg/dL - - - - 159(H)   LDLDIRECT mg/dL - - - - -   HDL >39.00 mg/dL - - - - 52.80   Trlycerides 0.0 - 149.0 mg/dL - - - - 112.0   Hemoglobin A1c 4.8 - 5.6 % 6.1 6.0(H) - - -   PHART 7.350 - 7.450 - 7.374 7.349(L) 7.470(H) -   PCO2ART 32.0 - 48.0 mmHg - 34.9 48.2(H) 38.4 -   HCO3 20.0 - 28.0 mmol/L - 20.4 26.3 27.9 -   TCO2 22 - 32 mmol/L - 21(L) 28 29 -   ACIDBASEDEF 0.0 - 2.0 mmol/L - 4.0(H) - - -   O2SAT % - 100.0 96.0 98.0 -        Pulmonary Assessment Scores:  Pulmonary Assessment Scores     Row Name 07/06/21 1036         ADL UCSD   ADL Phase Entry     SOB Score total 62     Rest 0     Walk 1     Stairs 4     Bath 1     Dress 1     Shop 4       CAT Score   CAT Score 9       mMRC Score   mMRC Score 1               UCSD: Self-administered rating of dyspnea associated with activities of daily living (ADLs) 6-point scale (0 = "not at all" to 5 = "maximal or unable to do because of breathlessness")  Scoring Scores range from 0 to 120.  Minimally important difference is 5 units  CAT: CAT can identify the health impairment of COPD patients and is better correlated with disease progression.  CAT has a scoring range of zero to 40. The CAT score is classified into four groups of low (less than 10), medium (10 - 20), high (21-30) and very high (31-40) based on the impact level of disease on health status. A CAT score over 10 suggests significant symptoms.  A worsening CAT score could be explained by an exacerbation, poor medication adherence, poor inhaler technique, or progression of COPD or comorbid conditions.  CAT MCID is 2 points  mMRC: mMRC (Modified Medical Research Council) Dyspnea Scale is used to assess the degree of baseline functional disability in patients of respiratory disease due to dyspnea. No minimal important difference is established. A decrease in score of 1 point or greater is considered a positive change.   Pulmonary Function Assessment:   Exercise Target Goals: Exercise Program Goal: Individual exercise prescription set using results from initial 6 min walk test and THRR while considering  patients activity barriers and safety.   Exercise Prescription Goal: Initial exercise prescription builds to 30-45 minutes a day of aerobic activity, 2-3 days per week.  Home exercise guidelines will be given to patient during program as part of exercise prescription that the participant will acknowledge.  Education: Aerobic Exercise: - Group verbal and visual presentation on the components of exercise prescription. Introduces F.I.T.T principle from ACSM for exercise prescriptions.  Reviews F.I.T.T. principles of aerobic exercise including progression. Written material given at  graduation. Flowsheet Row Pulmonary Rehab from 09/07/2021 in [facility] Cardiac and Pulmonary Rehab  Date 07/28/21  Educator [facility]  Instruction Review Code 1- Verbalizes Understanding       Education: Resistance Exercise: - Group verbal and visual presentation on the components of exercise prescription. Introduces F.I.T.T principle from  ACSM for exercise prescriptions  Reviews F.I.T.T. principles of resistance exercise including progression. Written material given at graduation.    Education: Exercise & Equipment Safety: - Individual verbal instruction and demonstration of equipment use and safety with use of the equipment. Flowsheet Row Pulmonary Rehab from 09/07/2021 in [facility] Cardiac and Pulmonary Rehab  Education need identified 07/06/21  Date 07/06/21  Educator [place]  Instruction Review Code 1- Verbalizes Understanding       Education: Exercise Physiology & General Exercise Guidelines: - Group verbal and written instruction with models to review the exercise physiology of the cardiovascular system and associated critical values. Provides general exercise guidelines with specific guidelines to those with heart or lung disease.  Flowsheet Row Pulmonary Rehab from 09/07/2021 in [facility] Cardiac and Pulmonary Rehab  Date 07/21/21  Educator [facility]  Instruction Review Code 1- Verbalizes Understanding       Education: Flexibility, Balance, Mind/Body Relaxation: - Group verbal and visual presentation with interactive activity on the components of exercise prescription. Introduces F.I.T.T principle from ACSM for exercise prescriptions. Reviews F.I.T.T. principles of flexibility and balance exercise training including progression. Also discusses the mind body connection.  Reviews various relaxation techniques to help reduce and manage stress (i.e. Deep breathing, progressive muscle relaxation, and visualization). Balance handout provided to take home. Written material given at graduation.   Activity Barriers  & Risk Stratification:  Activity Barriers & Cardiac Risk Stratification - 07/06/21 1039       Activity Barriers & Cardiac Risk Stratification   Activity Barriers Joint Problems;Back Problems;Other (comment);Deconditioning;Muscular Weakness;Shortness of Breath    Comments Guillian Barre, targets feet and legs bilaterally             6 Minute Walk:  6 Minute Walk     Row Name 07/06/21 1041         6 Minute Walk   Phase Initial     Distance 930 feet     Walk Time 6 minutes     # of Rest Breaks 0     MPH 1.76     METS 1.67     RPE 13     Perceived Dyspnea  2     VO2 Peak 5.86     Symptoms Yes (comment)     Comments SOB, fatigued legs     Resting HR 65 bpm     Resting BP 136/76     Resting Oxygen Saturation  95 %     Exercise Oxygen Saturation  during 6 min walk 94 %     Max Ex. HR 99 bpm     Max Ex. BP 144/72     2 Minute Post BP 134/74       Interval HR   1 Minute HR 89     2 Minute HR 96     3 Minute HR 99     4 Minute HR 95     5 Minute HR 98     6 Minute HR 99     2 Minute Post HR 61     Interval Heart Rate? Yes       Interval Oxygen   Interval Oxygen? Yes     Baseline Oxygen Saturation % 95 %     1 Minute Oxygen Saturation % 96 %     1 Minute Liters of Oxygen 0 L  RA     2 Minute Oxygen Saturation % 94 %     2 Minute Liters of Oxygen 0 L  3 Minute Oxygen Saturation % 95 %     3 Minute Liters of Oxygen 0 L     4 Minute Oxygen Saturation % 95 %     4 Minute Liters of Oxygen 0 L     5 Minute Oxygen Saturation % 95 %     5 Minute Liters of Oxygen 0 L     6 Minute Oxygen Saturation % 95 %     6 Minute Liters of Oxygen 0 L     2 Minute Post Oxygen Saturation % 96 %     2 Minute Post Liters of Oxygen 0 L             Oxygen Initial Assessment:  Oxygen Initial Assessment - 07/06/21 1035       Home Oxygen   Home Oxygen Device None    Sleep Oxygen Prescription CPAP    Home Exercise Oxygen Prescription None    Home Resting Oxygen Prescription  None    Compliance with Home Oxygen Use Yes      Initial 6 min Walk   Oxygen Used None      Program Oxygen Prescription   Program Oxygen Prescription None      Intervention   Short Term Goals To learn and understand importance of monitoring SPO2 with pulse oximeter and demonstrate accurate use of the pulse oximeter.;To learn and understand importance of maintaining oxygen saturations>88%;To learn and demonstrate proper pursed lip breathing techniques or other breathing techniques.     Long  Term Goals Verbalizes importance of monitoring SPO2 with pulse oximeter and return demonstration;Maintenance of O2 saturations>88%;Exhibits proper breathing techniques, such as pursed lip breathing or other method taught during program session             Oxygen Re-Evaluation:  Oxygen Re-Evaluation     Row Name 07/12/21 0812 07/26/21 0929 08/11/21 0819 09/05/21 0929       Program Oxygen Prescription   Program Oxygen Prescription None None None None      Home Oxygen   Home Oxygen Device None None None None    Sleep Oxygen Prescription CPAP CPAP CPAP CPAP    Home Exercise Oxygen Prescription None None None None    Home Resting Oxygen Prescription None None None None    Compliance with Home Oxygen Use Yes Yes Yes Yes      Goals/Expected Outcomes   Short Term Goals To learn and understand importance of monitoring SPO2 with pulse oximeter and demonstrate accurate use of the pulse oximeter.;To learn and understand importance of maintaining oxygen saturations>88%;To learn and demonstrate proper pursed lip breathing techniques or other breathing techniques.  To learn and understand importance of monitoring SPO2 with pulse oximeter and demonstrate accurate use of the pulse oximeter.;To learn and understand importance of maintaining oxygen saturations>88%;To learn and demonstrate proper pursed lip breathing techniques or other breathing techniques.  To learn and understand importance of monitoring SPO2  with pulse oximeter and demonstrate accurate use of the pulse oximeter.;To learn and understand importance of maintaining oxygen saturations>88%;To learn and demonstrate proper pursed lip breathing techniques or other breathing techniques.  To learn and understand importance of monitoring SPO2 with pulse oximeter and demonstrate accurate use of the pulse oximeter.;To learn and understand importance of maintaining oxygen saturations>88%;To learn and demonstrate proper pursed lip breathing techniques or other breathing techniques.     Long  Term Goals Verbalizes importance of monitoring SPO2 with pulse oximeter and return demonstration;Maintenance of O2 saturations>88%;Exhibits proper breathing techniques, such as  pursed lip breathing or other method taught during program session Verbalizes importance of monitoring SPO2 with pulse oximeter and return demonstration;Maintenance of O2 saturations>88%;Exhibits proper breathing techniques, such as pursed lip breathing or other method taught during program session Verbalizes importance of monitoring SPO2 with pulse oximeter and return demonstration;Maintenance of O2 saturations>88%;Exhibits proper breathing techniques, such as pursed lip breathing or other method taught during program session Verbalizes importance of monitoring SPO2 with pulse oximeter and return demonstration;Maintenance of O2 saturations>88%;Exhibits proper breathing techniques, such as pursed lip breathing or other method taught during program session    Comments Reviewed PLB technique with pt.  Talked about how it works and it's importance in maintaining their exercise saturations. She reports no problems with the CPAP. She does not usually use PLB, but she has read about it before, reviewed how to do it. SHe has a pulse ox at home and reports it stays above 90 and has not been low. She is doing well with her CPAP.  She has not felt that she has needed her PLB.  She was encouraged to continue to  practice using it to help when she does need it.  Usually, when she feels short of breath, she will slow down to catch her breath. She is doing well with her CPAP, she reports no issues right now (the tracker says she is only having two events at night now). She is practicing PLB and does feel like it helps when she is short of breath.  Usually, when she feels short of breath, she will slow down to catch her breath.    Goals/Expected Outcomes Short: Become more profiecient at using PLB.   Long: Become independent at using PLB. ST: practice PLB LT: become independent using PLB ST: practice PLB LT: become independent using PLB ST: practice PLB LT: become independent using PLB             Oxygen Discharge (Final Oxygen Re-Evaluation):  Oxygen Re-Evaluation - 09/05/21 0929       Program Oxygen Prescription   Program Oxygen Prescription None      Home Oxygen   Home Oxygen Device None    Sleep Oxygen Prescription CPAP    Home Exercise Oxygen Prescription None    Home Resting Oxygen Prescription None    Compliance with Home Oxygen Use Yes      Goals/Expected Outcomes   Short Term Goals To learn and understand importance of monitoring SPO2 with pulse oximeter and demonstrate accurate use of the pulse oximeter.;To learn and understand importance of maintaining oxygen saturations>88%;To learn and demonstrate proper pursed lip breathing techniques or other breathing techniques.     Long  Term Goals Verbalizes importance of monitoring SPO2 with pulse oximeter and return demonstration;Maintenance of O2 saturations>88%;Exhibits proper breathing techniques, such as pursed lip breathing or other method taught during program session    Comments She is doing well with her CPAP, she reports no issues right now (the tracker says she is only having two events at night now). She is practicing PLB and does feel like it helps when she is short of breath.  Usually, when she feels short of breath, she will slow down  to catch her breath.    Goals/Expected Outcomes ST: practice PLB LT: become independent using PLB             Initial Exercise Prescription:  Initial Exercise Prescription - 07/06/21 1000       Date of Initial Exercise RX and Referring Provider  Date 07/06/21    Referring Provider Ida Rogue MD      Oxygen   Maintain Oxygen Saturation 88% or higher      Treadmill   MPH 1.6    Grade 0    Minutes 15    METs 2.23      Recumbant Bike   Level 1    RPM 60    Watts 15    Minutes 15    METs 1.6      NuStep   Level 1    SPM 80    Minutes 15    METs 1.6      Prescription Details   Frequency (times per week) 2    Duration Progress to 30 minutes of continuous aerobic without signs/symptoms of physical distress      Intensity   THRR 40-80% of Max Heartrate 94-124    Ratings of Perceived Exertion 11-13    Perceived Dyspnea 0-4      Progression   Progression Continue to progress workloads to maintain intensity without signs/symptoms of physical distress.      Resistance Training   Training Prescription Yes    Weight 3 lb    Reps 10-15             Perform Capillary Blood Glucose checks as needed.  Exercise Prescription Changes:   Exercise Prescription Changes     Row Name 07/06/21 1000 07/25/21 1500 08/03/21 1100 08/09/21 1400 08/23/21 1400     Response to Exercise   Blood Pressure (Admit) 136/76 128/68 -- 120/62 118/62   Blood Pressure (Exercise) 144/72 130/80 -- 124/64 --   Blood Pressure (Exit) 134/74 124/68 -- 120/66 124/62   Heart Rate (Admit) 65 bpm 79 bpm -- 70 bpm 78 bpm   Heart Rate (Exercise) 99 bpm 102 bpm -- 96 bpm 87 bpm   Heart Rate (Exit) 61 bpm 70 bpm -- 72 bpm 81 bpm   Oxygen Saturation (Admit) 95 % 96 % -- 95 % 97 %   Oxygen Saturation (Exercise) 94 % 92 % -- 94 % 95 %   Oxygen Saturation (Exit) 96 % 96 % -- 96 % 95 %   Rating of Perceived Exertion (Exercise) 13 15 -- 13 12   Perceived Dyspnea (Exercise) 2 1 -- 2 1   Symptoms  SOB, fatigued legs -- -- SOB --   Comments walk test results fourth session -- -- --   Duration -- Continue with 30 min of aerobic exercise without signs/symptoms of physical distress. -- Continue with 30 min of aerobic exercise without signs/symptoms of physical distress. Continue with 30 min of aerobic exercise without signs/symptoms of physical distress.   Intensity -- THRR unchanged -- THRR unchanged THRR unchanged     Progression   Progression -- Continue to progress workloads to maintain intensity without signs/symptoms of physical distress. -- Continue to progress workloads to maintain intensity without signs/symptoms of physical distress. Continue to progress workloads to maintain intensity without signs/symptoms of physical distress.   Average METs -- 2 -- 2.14 2     Resistance Training   Training Prescription -- Yes -- Yes Yes   Weight -- 3 lb -- 3 lb 3 lb   Reps -- 10-15 -- 10-15 10-15     Interval Training   Interval Training -- -- -- No No     Treadmill   MPH -- -- -- 1.6 --   Grade -- -- -- 0 --   Minutes -- -- -- 15 --  METs -- -- -- 2.23 --     NuStep   Level -- -- -- 3 --   Minutes -- -- -- 15 --     Biostep-RELP   Level -- 1 -- 1 1   Minutes -- 15 -- 15 15   METs -- 2 -- 2 2     Track   Laps -- 20 -- 30 26   Minutes -- 15 -- 15 15   METs -- 2.09 -- 2.63 2.41     Home Exercise Plan   Plans to continue exercise at -- -- Home (comment)  walking, community gym Home (comment)  walking, community gym Home (comment)  walking, community gym   Frequency -- -- Add 2 additional days to program exercise sessions. Add 2 additional days to program exercise sessions. Add 2 additional days to program exercise sessions.   Initial Home Exercises Provided -- -- 08/03/21 08/03/21 08/03/21     Oxygen   Maintain Oxygen Saturation -- -- 88% or higher 88% or higher 88% or higher    Row Name 09/05/21 1100 09/20/21 0700           Response to Exercise   Blood Pressure  (Admit) 120/68 122/60      Blood Pressure (Exit) 130/72 110/62      Heart Rate (Admit) 69 bpm 74 bpm      Heart Rate (Exercise) 99 bpm 84 bpm      Heart Rate (Exit) 78 bpm 72 bpm      Oxygen Saturation (Admit) 95 % 94 %      Oxygen Saturation (Exercise) 94 % 92 %      Oxygen Saturation (Exit) 96 % 94 %      Rating of Perceived Exertion (Exercise) 12 12      Perceived Dyspnea (Exercise) 1 1      Symptoms none none      Duration Continue with 30 min of aerobic exercise without signs/symptoms of physical distress. Continue with 30 min of aerobic exercise without signs/symptoms of physical distress.      Intensity THRR unchanged THRR unchanged        Progression   Progression Continue to progress workloads to maintain intensity without signs/symptoms of physical distress. Continue to progress workloads to maintain intensity without signs/symptoms of physical distress.      Average METs 2.18 2.7        Resistance Training   Training Prescription Yes Yes      Weight 3 lb 3 lb      Reps 10-15 10-15        Interval Training   Interval Training No No        Treadmill   MPH -- 2.2      Minutes -- 15      METs -- 2.69        NuStep   Level -- 3      Minutes -- 15      METs -- 2.7        Arm Ergometer   Level 1.3 --      Minutes 15 --      METs 1.4 --        T5 Nustep   Level 1 --      Minutes 15 --      METs 1.9 --        Biostep-RELP   Level 1 --      Minutes 15 --      METs 2 --  Track   Laps 35 --      Minutes 15 --      METs 2.9 --        Home Exercise Plan   Plans to continue exercise at Home (comment)  walking, community gym Home (comment)  walking, community gym      Frequency Add 2 additional days to program exercise sessions. Add 2 additional days to program exercise sessions.      Initial Home Exercises Provided 08/03/21 08/03/21        Oxygen   Maintain Oxygen Saturation 88% or higher 88% or higher               Exercise Comments:    Exercise Comments     Row Name 07/12/21 6808           Exercise Comments First full day of exercise!  Patient was oriented to gym and equipment including functions, settings, policies, and procedures.  Patient's individual exercise prescription and treatment plan were reviewed.  All starting workloads were established based on the results of the 6 minute walk test done at initial orientation visit.  The plan for exercise progression was also introduced and progression will be customized based on patient's performance and goals.                Exercise Goals and Review:   Exercise Goals     Row Name 07/06/21 1047             Exercise Goals   Increase Physical Activity Yes       Intervention Provide advice, education, support and counseling about physical activity/exercise needs.;Develop an individualized exercise prescription for aerobic and resistive training based on initial evaluation findings, risk stratification, comorbidities and participant's personal goals.       Expected Outcomes Short Term: Attend rehab on a regular basis to increase amount of physical activity.;Long Term: Add in home exercise to make exercise part of routine and to increase amount of physical activity.;Long Term: Exercising regularly at least 3-5 days a week.       Increase Strength and Stamina Yes       Intervention Provide advice, education, support and counseling about physical activity/exercise needs.;Develop an individualized exercise prescription for aerobic and resistive training based on initial evaluation findings, risk stratification, comorbidities and participant's personal goals.       Expected Outcomes Short Term: Increase workloads from initial exercise prescription for resistance, speed, and METs.;Short Term: Perform resistance training exercises routinely during rehab and add in resistance training at home;Long Term: Improve cardiorespiratory fitness, muscular endurance and strength as  measured by increased METs and functional capacity (6MWT)       Able to understand and use rate of perceived exertion (RPE) scale Yes       Intervention Provide education and explanation on how to use RPE scale       Expected Outcomes Short Term: Able to use RPE daily in rehab to express subjective intensity level;Long Term:  Able to use RPE to guide intensity level when exercising independently       Able to understand and use Dyspnea scale Yes       Intervention Provide education and explanation on how to use Dyspnea scale       Expected Outcomes Short Term: Able to use Dyspnea scale daily in rehab to express subjective sense of shortness of breath during exertion;Long Term: Able to use Dyspnea scale to guide intensity level when exercising independently  Knowledge and understanding of Target Heart Rate Range (THRR) Yes       Intervention Provide education and explanation of THRR including how the numbers were predicted and where they are located for reference       Expected Outcomes Short Term: Able to state/look up THRR;Long Term: Able to use THRR to govern intensity when exercising independently;Short Term: Able to use daily as guideline for intensity in rehab       Able to check pulse independently Yes       Intervention Provide education and demonstration on how to check pulse in carotid and radial arteries.;Review the importance of being able to check your own pulse for safety during independent exercise       Expected Outcomes Short Term: Able to explain why pulse checking is important during independent exercise;Long Term: Able to check pulse independently and accurately       Understanding of Exercise Prescription Yes       Intervention Provide education, explanation, and written materials on patient's individual exercise prescription       Expected Outcomes Short Term: Able to explain program exercise prescription;Long Term: Able to explain home exercise prescription to exercise  independently                Exercise Goals Re-Evaluation :  Exercise Goals Re-Evaluation     Row Name 07/12/21 1610 07/25/21 1535 07/26/21 0931 08/03/21 1142 08/09/21 1446     Exercise Goal Re-Evaluation   Exercise Goals Review Increase Physical Activity;Able to understand and use rate of perceived exertion (RPE) scale;Knowledge and understanding of Target Heart Rate Range (THRR);Understanding of Exercise Prescription;Increase Strength and Stamina;Able to check pulse independently;Able to understand and use Dyspnea scale Increase Physical Activity;Increase Strength and Stamina Increase Physical Activity;Increase Strength and Stamina Increase Physical Activity;Increase Strength and Stamina;Able to understand and use rate of perceived exertion (RPE) scale;Able to understand and use Dyspnea scale;Knowledge and understanding of Target Heart Rate Range (THRR);Able to check pulse independently;Understanding of Exercise Prescription Increase Physical Activity;Increase Strength and Stamina;Understanding of Exercise Prescription   Comments Reviewed RPE and dyspnea scales, THR and program prescription with pt today.  Pt voiced understanding and was given a copy of goals to take home. Fraser Din has tolerated exercise well in her first sessions.  Oxygen stays in the 90s.She has reached 20 laps on the track. She will walk at home, not structured, but she would like to change that - sometimes she does such as saturday she went for a long walk. EP has not gone over home exercise yet. Reviewed home exercise with pt today.  Pt plans to walk and use gym in community for exercise.  The gym has a treadmill, elliptical, weights, and a NuStep.  Reviewed THR, pulse, RPE, sign and symptoms, pulse oximetery and when to call 911 or MD.  Also discussed weather considerations and indoor options.  Pt voiced understanding. Fraser Din is doing well in rehab.  She is up to 30 laps on the track.  We will continue to monitor her progress.    Expected Outcomes Short: Use RPE daily to regulate intensity. Long: Follow program prescription in THR. Short:  attend consistently Long:  improve overall stamina ST: EP to go over home exercise LT: exercise outside of rehab Short: Start to use gym in complex at least one extra day a week Long: Exercise independently on off days Short: Increase BioStep Long: Continue to improve stamina.    [place] Name 08/11/21 [phone] 08/23/21 1441 09/05/21 0931 09/05/21 1148  09/09/21 0947     Exercise Goal Re-Evaluation   Exercise Goals Review Increase Physical Activity;Increase Strength and Stamina;Understanding of Exercise Prescription Increase Physical Activity;Increase Strength and Stamina Increase Physical Activity;Increase Strength and Stamina Increase Physical Activity;Increase Strength and Stamina Increase Physical Activity;Increase Strength and Stamina;Understanding of Exercise Prescription   Comments Fraser Din is doing well in rehab. She is doing at home too.  She is going to gym and using NuStep and weight machines.  She went once last week and not this week.  We will continue to encourage her to exercise on her off days from rehab. Fraser Din continues to do well and walks between 26 and 32 laps on the track.  Oxygen stays in the mid 90s on room air. We will continue to monitor. Fraser Din is mostly walking around the neighborhood right now (1/2 mile: 59mnutes RPE 12, Dyspnea 2:  2x/week -weather permitting) PFraser Dinis doing well in rehab. She reached a max of 35 laps around the track! It would be beneficial to have her keep that consistent each time. She incorporated the arm ergometer into her exercise prescription and tolerating it well.  Her oxygen sats are staying above 88% during exercise. Will continue to monitor. PFraser Dinis doing well in rehab.  She is feeling better overall.  She will walk outside on her off days when the weather is nice. She also does her weights at home.  She is recovering her strength and stamina.   Expected Outcomes  Short: Continue to add in exercise on off days  Long: Continue to exercise independently Short:  maintain consistent exercise Long:  build overall stamina Short:  maintain consistent exercise, at least 30 minutes sessions outside of rehab Long:  build overall stamina Short: Continue to build up laps on track Long: Continue to increase overall MET level Short: Continue to exercise on off days Long: COnitnue to improve stamina.    [place] 09/20/21 0741             Exercise Goal Re-Evaluation   Exercise Goals Review Increase Physical Activity;Increase Strength and Stamina       Comments PFraser Dinattends consistently and has improved average MET level.  Staff will encourage trying 4 lb for strength work.       Expected Outcomes Short: try 4 lb Long:  continue to improve stamina                Discharge Exercise Prescription (Final Exercise Prescription Changes):  Exercise Prescription Changes - 09/20/21 0700       Response to Exercise   Blood Pressure (Admit) 122/60    Blood Pressure (Exit) 110/62    Heart Rate (Admit) 74 bpm    Heart Rate (Exercise) 84 bpm    Heart Rate (Exit) 72 bpm    Oxygen Saturation (Admit) 94 %    Oxygen Saturation (Exercise) 92 %    Oxygen Saturation (Exit) 94 %    Rating of Perceived Exertion (Exercise) 12    Perceived Dyspnea (Exercise) 1    Symptoms none    Duration Continue with 30 min of aerobic exercise without signs/symptoms of physical distress.    Intensity THRR unchanged      Progression   Progression Continue to progress workloads to maintain intensity without signs/symptoms of physical distress.    Average METs 2.7      Resistance Training   Training Prescription Yes    Weight 3 lb    Reps 10-15      Interval Training  Interval Training No      Treadmill   MPH 2.2    Minutes 15    METs 2.69      NuStep   Level 3    Minutes 15    METs 2.7      Home Exercise Plan   Plans to continue exercise at Home (comment)   walking,  community gym   Frequency Add 2 additional days to program exercise sessions.    Initial Home Exercises Provided 08/03/21      Oxygen   Maintain Oxygen Saturation 88% or higher             Nutrition:  Target Goals: Understanding of nutrition guidelines, daily intake of sodium <1527m, cholesterol <2049m calories 30% from fat and 7% or less from saturated fats, daily to have 5 or more servings of fruits and vegetables.  Education: All About Nutrition: -Group instruction provided by verbal, written material, interactive activities, discussions, models, and posters to present general guidelines for heart healthy nutrition including fat, fiber, MyPlate, the role of sodium in heart healthy nutrition, utilization of the nutrition label, and utilization of this knowledge for meal planning. Follow up email sent as well. Written material given at graduation.   Biometrics:  Pre Biometrics - 07/06/21 1039       Pre Biometrics   Height 5' 6.5" (1.689 m)    Weight 178 lb 14.4 oz (81.1 kg)    BMI (Calculated) 28.45    Single Leg Stand 3.97 seconds              Nutrition Therapy Plan and Nutrition Goals:  Nutrition Therapy & Goals - 07/28/21 0926       Nutrition Therapy   Diet Heart healthy, low Na    Protein (specify units) 65g    Fiber 25 grams    Whole Grain Foods 3 servings    Saturated Fats 12 max. grams    Fruits and Vegetables 8 servings/day    Sodium 1.5 grams      Personal Nutrition Goals   Nutrition Goal ST: practice mindful eating and paying attention to hunger cues, try including lunch or satiating snack before dinner to avoid eating way past point of fullness LT: prevent over eating at dinner.    Comments [place] [facility] [employer] Meal per day- lunch or dinner. she feels she has a problem with overeating. She will make oatmeal or dry cereal (all bran or cheerios with lactaid, but usually skim milk) and sometimes eggs. She may bring something home from dinner or have a  sandwich (chciken salad, grilled cheese, tuna salad on whole wheat or white). She may also cook hamburger or salmon which she keeps in the fridge.Drinks: water and unsweet tea with meals. Meal includes entree with three vegetables, soup, and salad as well as dessert and she wants to stop ordering it especially since she does not enjoy it much. She feels these meals taste salty. Discussed heart healthy eatong and hunger/fullness. She would like to try eating before dinner - leftovers from the night before, a sandwich, crackers with peanut butter.      Intervention Plan   Intervention Prescribe, educate and counsel regarding individualized specific dietary modifications aiming towards targeted core components such as weight, hypertension, lipid management, diabetes, heart failure and other comorbidities.;Nutrition handout(s) given to patient.    Expected Outcomes Short Term Goal: Understand basic principles of dietary content, such as calories, fat, sodium, cholesterol and nutrients.;Short Term Goal: A plan has been developed  with personal nutrition goals set during dietitian appointment.;Long Term Goal: Adherence to prescribed nutrition plan.             Nutrition Assessments:  MEDIFICTS Score Key: ?70 Need to make dietary changes  40-70 Heart Healthy Diet ? 40 Therapeutic Level Cholesterol Diet  Flowsheet Row Pulmonary Rehab from 07/06/2021 in [facility] Cardiac and Pulmonary Rehab  Picture Your Plate Total Score on Admission 61      Picture Your Plate Scores: <73 Unhealthy dietary pattern with much room for improvement. 41-50 Dietary pattern unlikely to meet recommendations for good health and room for improvement. 51-60 More healthful dietary pattern, with some room for improvement.  >60 Healthy dietary pattern, although there may be some specific behaviors that could be improved.   Nutrition Goals Re-Evaluation:  Nutrition Goals Re-Evaluation     [place] Name 08/11/21 0814 09/05/21 0935            Goals   Nutrition Goal ST: practice mindful eating and paying attention to hunger cues, try including lunch or satiating snack before dinner to avoid eating way past point of fullness LT: prevent over eating at dinner. ST: practice mindful eating and paying attention to hunger cues LT: prevent over eating at dinner.      Comment Fraser Din is doing well in rehab.  She admits to having room to improve.  Her weakness is desserts.  She eats once a day served and it comes with dessert.  SHe has been trying to take it home to snack on versus eating all at once.  She is doing better about eating lunch and trying to focus on her hunger cues at night.  She continues to eat oatmeal or cereal for breakfast. Fraser Din reports doing well with nutrition and has been sometimes taking leftovers from dinner home. She now has yogurt that she will eat in between her meals. She is also including eggs with her breakfast instead of oatmeal (encouraged her she can include both).      Expected Outcome Short: Continue to focus on hunger cues Long: continue to focus on heart healthy eating Short: Continue to focus on hunger cues Long: continue to focus on heart healthy eating               Nutrition Goals Discharge (Final Nutrition Goals Re-Evaluation):  Nutrition Goals Re-Evaluation - 09/05/21 0935       Goals   Nutrition Goal ST: practice mindful eating and paying attention to hunger cues LT: prevent over eating at dinner.    Comment Fraser Din reports doing well with nutrition and has been sometimes taking leftovers from dinner home. She now has yogurt that she will eat in between her meals. She is also including eggs with her breakfast instead of oatmeal (encouraged her she can include both).    Expected Outcome Short: Continue to focus on hunger cues Long: continue to focus on heart healthy eating             Psychosocial: Target Goals: Acknowledge presence or absence of significant depression and/or stress, maximize  coping skills, provide positive support system. Participant is able to verbalize types and ability to use techniques and skills needed for reducing stress and depression.   Education: Stress, Anxiety, and Depression - Group verbal and visual presentation to define topics covered.  Reviews how body is impacted by stress, anxiety, and depression.  Also discusses healthy ways to reduce stress and to treat/manage anxiety and depression.  Written material given at graduation. Flowsheet Row  Pulmonary Rehab from 09/07/2021 in [facility] Cardiac and Pulmonary Rehab  Date 07/14/21  Educator [facility]  Instruction Review Code 1- Verbalizes Understanding       Education: Sleep Hygiene -Provides group verbal and written instruction about how sleep can affect your health.  Define sleep hygiene, discuss sleep cycles and impact of sleep habits. Review good sleep hygiene tips.    Initial Review & Psychosocial Screening:  Initial Psych Review & Screening - 07/01/21 1302       Initial Review   Current issues with None Identified      Family Dynamics   Good Support System? Yes   2 nieces not local, sister in [place], neighbors     Barriers   Psychosocial barriers to participate in program There are no identifiable barriers or psychosocial needs.;The patient should benefit from training in stress management and relaxation.      Screening Interventions   Interventions Encouraged to exercise;To provide support and resources with identified psychosocial needs;Provide feedback about the scores to participant    Expected Outcomes Short Term goal: Utilizing psychosocial counselor, staff and physician to assist with identification of specific Stressors or current issues interfering with healing process. Setting desired goal for each stressor or current issue identified.;Long Term Goal: Stressors or current issues are controlled or eliminated.;Long Term goal: The participant improves quality of Life and PHQ9 Scores as seen  by post scores and/or verbalization of changes;Short Term goal: Identification and review with participant of any Quality of Life or Depression concerns found by scoring the questionnaire.             Quality of Life Scores:  Scores of 19 and below usually indicate a poorer quality of life in these areas.  A difference of  2-3 points is a clinically meaningful difference.  A difference of 2-3 points in the total score of the Quality of Life Index has been associated with significant improvement in overall quality of life, self-image, physical symptoms, and general health in studies assessing change in quality of life.  PHQ-9: Recent Review Flowsheet Data     Depression screen [facility] 2/9 09/01/2021 08/02/2021 07/06/2021 04/25/2021 01/10/2021   Decreased Interest 0 0 1 0 0   Down, Depressed, Hopeless 0 0 0 0 0   PHQ - 2 Score 0 0 1 0 0   Altered sleeping - 1 2 - -   Tired, decreased energy - 1 2 - -   Change in appetite - 1 1 - -   Feeling bad or failure about yourself  - 0 0 - -   Trouble concentrating - 0 1 - -   Moving slowly or fidgety/restless - 0 1 - -   Suicidal thoughts - 0 0 - -   PHQ-9 Score - 3 8 - -   Difficult doing work/chores - Somewhat difficult Somewhat difficult - -      Interpretation of Total Score  Total Score Depression Severity:  1-4 = Minimal depression, 5-9 = Mild depression, 10-14 = Moderate depression, 15-19 = Moderately severe depression, 20-27 = Severe depression   Psychosocial Evaluation and Intervention:  Psychosocial Evaluation - 07/01/21 1345       Psychosocial Evaluation & Interventions   Interventions Encouraged to exercise with the program and follow exercise prescription    Comments 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 She was on the ventilator for 9 days and went through many weeks of rehab. She has regained most  of her strength and living in independent living at the [facility]. The biggest  symptom that hasn't resolved is her shortness of breath. 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 She has neices out of town that are supportive, her sister in [place], and a good neighborhood support group where she lives. She is hoping this program will help with her stamina and breathing.    Expected Outcomes Short: attend Pulmonary rehab for education and exercise. Long: develop and maintain positive self care habits.    Continue Psychosocial Services  Follow up required by staff             Psychosocial Re-Evaluation:  Psychosocial Re-Evaluation     [place] Name 07/26/21 [phone] 08/11/21 8022 09/05/21 3361         Psychosocial Re-Evaluation   Current issues with Current Stress Concerns None Identified None Identified     Comments She reports not having any major stressors aside from her continuous shortness of breath. She likes to bake - things like banana bread to help reduce stress. She has friends and relatives that she relies on for support. She reports sleeping well - 8 hours at least. Fraser Din is doing well in rehab. Fraser Din still does some baking and she is getting ready to gear up for bake sale next week.  She currently denies any major stressors and she continues to sleep well. She is using her CPAP. Fraser Din reports doing well with no major stressors at this time and is happy with her progress, last year at this time she was using a rollator. She reports sleeping well and having no issues with the CPAP.     Expected Outcomes ST: continue with coping tools for stress and attending rehab LT: maintain positive attitude short; Continue to exericse for mental boost Long: Conitnue to stay positive short; Continue to exericse for mental boost Long: Conitnue to stay positive     Interventions Encouraged to attend Pulmonary Rehabilitation for the  exercise Encouraged to attend Pulmonary Rehabilitation for the exercise Encouraged to attend Pulmonary Rehabilitation for the exercise     Continue Psychosocial Services  Follow up required by staff -- Follow up required by staff       Initial Review   Source of Stress Concerns Chronic Illness -- --              Psychosocial Discharge (Final Psychosocial Re-Evaluation):  Psychosocial Re-Evaluation - 09/05/21 0938       Psychosocial Re-Evaluation   Current issues with None Identified    Comments Fraser Din reports doing well with no major stressors at this time and is happy with her progress, last year at this time she was using a rollator. She reports sleeping well and having no issues with the CPAP.    Expected Outcomes short; Continue to exericse for mental boost Long: Conitnue to stay positive    Interventions Encouraged to attend Pulmonary Rehabilitation for the exercise    Continue Psychosocial Services  Follow up required by staff             Education: Education Goals: Education classes will be provided on a weekly basis, covering required topics. Participant will state understanding/return demonstration of topics presented.  Learning Barriers/Preferences:  Learning Barriers/Preferences - 07/01/21 1307       Learning Barriers/Preferences   Learning  Barriers None    Learning Preferences None             General Pulmonary Education Topics:  Infection Prevention: - Provides verbal and written material to individual with discussion of infection control including proper hand washing and proper equipment cleaning during exercise session. Flowsheet Row Pulmonary Rehab from 09/07/2021 in [facility] Cardiac and Pulmonary Rehab  Education need identified 07/06/21  Date 07/06/21  Educator [place]  Instruction Review Code 1- Verbalizes Understanding       Falls Prevention: - Provides verbal and written material to individual with discussion of falls prevention and  safety. Flowsheet Row Pulmonary Rehab from 09/07/2021 in [facility] Cardiac and Pulmonary Rehab  Education need identified 07/06/21  Date 07/06/21  Educator [place]  Instruction Review Code 1- Verbalizes Understanding       Chronic Lung Disease Review: - Group verbal instruction with posters, models, PowerPoint presentations and videos,  to review new updates, new respiratory medications, new advancements in procedures and treatments. Providing information on websites and "800" numbers for continued self-education. Includes information about supplement oxygen, available portable oxygen systems, continuous and intermittent flow rates, oxygen safety, concentrators, and Medicare reimbursement for oxygen. Explanation of Pulmonary Drugs, including class, frequency, complications, importance of spacers, rinsing mouth after steroid MDI's, and proper cleaning methods for nebulizers. Review of basic lung anatomy and physiology related to function, structure, and complications of lung disease. Review of risk factors. Discussion about methods for diagnosing sleep apnea and types of masks and machines for OSA. Includes a review of the use of types of environmental controls: home humidity, furnaces, filters, dust mite/pet prevention, HEPA vacuums. Discussion about weather changes, air quality and the benefits of nasal washing. Instruction on Warning signs, infection symptoms, calling MD promptly, preventive modes, and value of vaccinations. Review of effective airway clearance, coughing and/or vibration techniques. Emphasizing that all should Create an Action Plan. Written material given at graduation. Flowsheet Row Pulmonary Rehab from 09/07/2021 in [facility] Cardiac and Pulmonary Rehab  Education need identified 07/06/21  Date 09/07/21  Educator [facility]  Instruction Review Code 1- Verbalizes Understanding       AED/CPR: - Group verbal and written instruction with the use of models to demonstrate the basic use of the AED with  the basic ABC's of resuscitation.    Anatomy and Cardiac Procedures: - Group verbal and visual presentation and models provide information about basic cardiac anatomy and function. Reviews the testing methods done to diagnose heart disease and the outcomes of the test results. Describes the treatment choices: Medical Management, Angioplasty, or Coronary Bypass Surgery for treating various heart conditions including Myocardial Infarction, Angina, Valve Disease, and Cardiac Arrhythmias.  Written material given at graduation.   Medication Safety: - Group verbal and visual instruction to review commonly prescribed medications for heart and lung disease. Reviews the medication, class of the drug, and side effects. Includes the steps to properly store meds and maintain the prescription regimen.  Written material given at graduation. Flowsheet Row Pulmonary Rehab from 09/07/2021 in [facility] Cardiac and Pulmonary Rehab  Date 08/23/21  Educator SB  Instruction Review Code 1- Verbalizes Understanding       Other: -Provides group and verbal instruction on various topics (see comments)   Knowledge Questionnaire Score:  Knowledge Questionnaire Score - 07/06/21 1037       Knowledge Questionnaire Score   Pre Score 15/18: O2 sat, Albuterol              Core Components/Risk Factors/Patient Goals at Admission:  Personal Goals and Risk Factors at Admission - 07/06/21 1047       Core Components/Risk Factors/Patient Goals on Admission    Weight Management Yes;Weight Loss    Intervention Weight Management: Provide education and appropriate resources to help participant work on and attain dietary goals.;Weight Management: Develop a combined nutrition and exercise program designed to reach desired caloric intake, while maintaining appropriate intake of nutrient and fiber, sodium and fats, and appropriate energy expenditure required for the weight goal.;Weight Management/Obesity: Establish reasonable  short term and long term weight goals.    Admit Weight 178 lb 14.4 oz (81.1 kg)    Goal Weight: Short Term 173 lb (78.5 kg)    Goal Weight: Long Term 168 lb (76.2 kg)    Expected Outcomes Short Term: Continue to assess and modify interventions until short term weight is achieved;Long Term: Adherence to nutrition and physical activity/exercise program aimed toward attainment of established weight goal;Weight Loss: Understanding of general recommendations for a balanced deficit meal plan, which promotes 1-2 lb weight loss per week and includes a negative energy balance of [phone] kcal/d;Understanding of distribution of calorie intake throughout the day with the consumption of 4-5 meals/snacks;Understanding recommendations for meals to include 15-35% energy as protein, 25-35% energy from fat, 35-60% energy from carbohydrates, less than 249m of dietary cholesterol, 20-35 gm of total fiber daily    Hypertension Yes    Intervention Provide education on lifestyle modifcations including regular physical activity/exercise, weight management, moderate sodium restriction and increased consumption of fresh fruit, vegetables, and low fat dairy, alcohol moderation, and smoking cessation.;Monitor prescription use compliance.    Expected Outcomes Short Term: Continued assessment and intervention until BP is < 140/924mHG in hypertensive participants. < 130/80110mG in hypertensive participants with diabetes, heart failure or chronic kidney disease.;Long Term: Maintenance of blood pressure at goal levels.    Lipids Yes    Intervention Provide education and support for participant on nutrition & aerobic/resistive exercise along with prescribed medications to achieve LDL <69m55mDL >40mg6m Expected Outcomes Short Term: Participant states understanding of desired cholesterol values and is compliant with medications prescribed. Participant is following exercise prescription and nutrition guidelines.;Long Term: Cholesterol  controlled with medications as prescribed, with individualized exercise RX and with personalized nutrition plan. Value goals: LDL < 69mg,65m > 40 mg.             Education:Diabetes - Individual verbal and written instruction to review signs/symptoms of diabetes, desired ranges of glucose level fasting, after meals and with exercise. Acknowledge that pre and post exercise glucose checks will be done for 3 sessions at entry of program.   Know Your Numbers and Heart Failure: - Group verbal and visual instruction to discuss disease risk factors for cardiac and pulmonary disease and treatment options.  Reviews associated critical values for Overweight/Obesity, Hypertension, Cholesterol, and Diabetes.  Discusses basics of heart failure: signs/symptoms and treatments.  Introduces Heart Failure Zone chart for action plan for heart failure.  Written material given at graduation. Flowsheet Row [facility]  Date 09/01/21  Educator SB  Instruction Review Code 1- Verbalizes Understanding       Core Components/Risk Factors/Patient Goals Review:   Goals and Risk Factor Review     Row Name 07/26/21 0933 08/11/21 0817 09/05/21 0933         Core Components/Risk Factors/Patient Goals Review   Personal Goals Review Hypertension;Lipids;Heart Failure;Improve shortness of breath with ADL's Hypertension;Lipids;Heart Failure;Improve  shortness of breath with ADL's;Weight Management/Obesity Hypertension;Lipids;Heart Failure;Improve shortness of breath with ADL's;Weight Management/Obesity     Review Fraser Din reports that her ADLs are harder due to her shortness of breath. 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 She denies any heart failure symptoms at this time. She checks her BP multiple times per week - 130/60-70. She has not met with RD yet. She is taking all her medication as directed and  is not having any problems with them. Fraser Din is doing well in rehab. Her weight is down a pound since starting.  She is doing better with her breathing.  She denies any heart failure symptoms.  Her pressures are doing well and she is doing well with her meds.  Her breathing continues ot improve some days, but there are days where it does not feel like it at much. Fraser Din feels like her shortnes of breath has improved since starting rehab. She will sometimes take her BP at home 120-130/70 at home. She has an appointment with her PCP regarding leg swelling and shortness of breath - gave her lasix. Her weight has been stable, she weighs herself daily.     Expected Outcomes ST: continue to monitor vitals LT: manage ADLs with less shortness of breath Short: continue to work on breathing Long: conitnue to monitor risk factors. Short: continue to work on breathing, see if lasix helps with leg swelling Long: conitnue to monitor risk factors.              Core Components/Risk Factors/Patient Goals at Discharge (Final Review):   Goals and Risk Factor Review - 09/05/21 0933       Core Components/Risk Factors/Patient Goals Review   Personal Goals Review Hypertension;Lipids;Heart Failure;Improve shortness of breath with ADL's;Weight Management/Obesity    Review Fraser Din feels like her shortnes of breath has improved since starting rehab. She will sometimes take her BP at home 120-130/70 at home. She has an appointment with her PCP regarding leg swelling and shortness of breath - gave her lasix. Her weight has been stable, she weighs herself daily.    Expected Outcomes Short: continue to work on breathing, see if lasix helps with leg swelling Long: conitnue to monitor risk factors.             ITP Comments:  ITP Comments     Row Name 07/01/21 1343 07/06/21 1032 07/12/21 0811 07/28/21 0925 08/03/21 0708   ITP Comments Initial orientation completed. Diagnosis can be found in [facility] 8/22. EP orientation scheduled for  Wednesday 10/5 at 9:30. Completed 6MWT and gym orientation. Initial ITP created and sent for review to Dr. Ottie Glazier, Medical Director. First full day of exercise!  Patient was oriented to gym and equipment including functions, settings, policies, and procedures.  Patient's individual exercise prescription and treatment plan were reviewed.  All starting workloads were established based on the results of the 6 minute walk test done at initial orientation visit.  The plan for exercise progression was also introduced and progression will be customized based on patient's performance and goals. Complete initial RD consultation 30 Day review completed. Medical Director ITP review done, changes made as directed, and signed approval by Medical Director.    [place] Name 08/31/21 0655 09/28/21 0710         ITP Comments 30 Day review completed. Medical Director ITP review done, changes made as directed, and signed approval by Medical Director. 30 Day review completed.  Medical Director ITP review done, changes made as directed, and signed approval by Medical Director.               Comments:

## 2021-09-30 ENCOUNTER — Other Ambulatory Visit: Payer: Self-pay

## 2021-09-30 ENCOUNTER — Encounter: Payer: Medicare Other | Admitting: *Deleted

## 2021-09-30 DIAGNOSIS — R0600 Dyspnea, unspecified: Secondary | ICD-10-CM

## 2021-09-30 NOTE — Progress Notes (Signed)
Daily Session Note  Patient Details  Name: Alison Huffman MRN: [id number] Date of Birth: 10-23-1940 Referring Provider:   Flowsheet Row Pulmonary Rehab from 07/06/2021 in [facility] Cardiac and Pulmonary Rehab  Referring Provider Ida Rogue MD       Encounter Date: 09/30/2021  Check In:  Session Check In - 09/30/21 0918       Check-In   Supervising physician immediately available to respond to emergencies See telemetry face sheet for immediately available ER MD    Location [facility]-Cardiac & Pulmonary Rehab    Staff Present Renita Papa, RN BSN;Joseph [place], RCP,RRT,BSRT;Jessica Seis [place], [place], RCEP, CCRP, CCET    Virtual Visit No    Medication changes reported     No    Fall or balance concerns reported    No    Warm-up and Cool-down Performed on first and last piece of equipment    Resistance Training Performed Yes    VAD Patient? No      Pain Assessment   Currently in Pain? No/denies                Social History   Tobacco Use  Smoking Status Never  Smokeless Tobacco Never  Tobacco Comments   07/22/2018 "smoked some in college; 1960s;  nothing since"    Goals Met:  Independence with exercise equipment Exercise tolerated well No report of concerns or symptoms today Strength training completed today  Goals Unmet:  Not Applicable  Comments: Pt able to follow exercise prescription today without complaint.  Will continue to monitor for progression.    Dr. Emily Filbert is Medical Director for [place].  Dr. Ottie Glazier is Medical Director for [facility] Pulmonary Rehabilitation.

## 2021-10-05 ENCOUNTER — Encounter: Payer: Medicare Other | Attending: Cardiovascular Disease

## 2021-10-05 ENCOUNTER — Other Ambulatory Visit: Payer: Self-pay

## 2021-10-05 DIAGNOSIS — R0600 Dyspnea, unspecified: Secondary | ICD-10-CM | POA: Insufficient documentation

## 2021-10-05 NOTE — Progress Notes (Signed)
Daily Session Note  Patient Details  Name: Alison Huffman MRN: [id number] Date of Birth: September 07, 1941 Referring Provider:   Flowsheet Row Pulmonary Rehab from 07/06/2021 in [facility] Cardiac and Pulmonary Rehab  Referring Provider Ida Rogue MD       Encounter Date: 10/05/2021  Check In:  Session Check In - 10/05/21 0927       Check-In   Supervising physician immediately available to respond to emergencies See telemetry face sheet for immediately available ER MD    Location [facility]-Cardiac & Pulmonary Rehab    Staff Present Birdie Sons, MPA, Elveria Rising, BA, ACSM CEP, Exercise Physiologist;Joseph Tessie Fass, [place]    Virtual Visit No    Medication changes reported     No    Fall or balance concerns reported    No    Warm-up and Cool-down Performed on first and last piece of equipment    Resistance Training Performed Yes    VAD Patient? No    PAD/SET Patient? No      Pain Assessment   Currently in Pain? No/denies                Social History   Tobacco Use  Smoking Status Never  Smokeless Tobacco Never  Tobacco Comments   07/22/2018 "smoked some in college; 1960s;  nothing since"    Goals Met:  Independence with exercise equipment Exercise tolerated well No report of concerns or symptoms today Strength training completed today  Goals Unmet:  Not Applicable  Comments: Pt able to follow exercise prescription today without complaint.  Will continue to monitor for progression.    Dr. Emily Filbert is Medical Director for [place].  Dr. Ottie Glazier is Medical Director for [facility] Pulmonary Rehabilitation.

## 2021-10-07 ENCOUNTER — Other Ambulatory Visit: Payer: Self-pay

## 2021-10-07 ENCOUNTER — Encounter: Payer: Medicare Other | Admitting: *Deleted

## 2021-10-07 DIAGNOSIS — R0600 Dyspnea, unspecified: Secondary | ICD-10-CM

## 2021-10-07 NOTE — Progress Notes (Signed)
Daily Session Note  Patient Details  Name: Alison Huffman MRN: [id number] Date of Birth: 24-Feb-1941 Referring Provider:   Flowsheet Row Pulmonary Rehab from 07/06/2021 in [facility] Cardiac and Pulmonary Rehab  Referring Provider Ida Rogue MD       Encounter Date: 10/07/2021  Check In:  Session Check In - 10/07/21 0920       Check-In   Supervising physician immediately available to respond to emergencies See telemetry face sheet for immediately available ER MD    Location [facility]-Cardiac & Pulmonary Rehab    Staff Present Renita Papa, RN BSN;Joseph [place], RCP,RRT,BSRT;Jessica [place], [place], RCEP, CCRP, CCET    Virtual Visit No    Medication changes reported     No    Fall or balance concerns reported    No    Warm-up and Cool-down Performed on first and last piece of equipment    Resistance Training Performed Yes    VAD Patient? No    PAD/SET Patient? No      Pain Assessment   Currently in Pain? No/denies                Social History   Tobacco Use  Smoking Status Never  Smokeless Tobacco Never  Tobacco Comments   07/22/2018 "smoked some in college; 1960s;  nothing since"    Goals Met:  Independence with exercise equipment Exercise tolerated well No report of concerns or symptoms today Strength training completed today  Goals Unmet:  Not Applicable  Comments: Pt able to follow exercise prescription today without complaint.  Will continue to monitor for progression.    Dr. Emily Filbert is Medical Director for [place].  Dr. Ottie Glazier is Medical Director for [facility] Pulmonary Rehabilitation.

## 2021-10-10 ENCOUNTER — Encounter: Payer: Medicare Other | Admitting: *Deleted

## 2021-10-10 ENCOUNTER — Other Ambulatory Visit: Payer: Self-pay

## 2021-10-10 DIAGNOSIS — R0600 Dyspnea, unspecified: Secondary | ICD-10-CM

## 2021-10-10 NOTE — Progress Notes (Signed)
Daily Session Note  Patient Details  Name: ALIANYS CHACKO MRN: [id number] Date of Birth: 02/29/1941 Referring Provider:   Flowsheet Row Pulmonary Rehab from 07/06/2021 in [facility] Cardiac and Pulmonary Rehab  Referring Provider Ida Rogue MD       Encounter Date: 10/10/2021  Check In:  Session Check In - 10/10/21 1022       Check-In   Supervising physician immediately available to respond to emergencies See telemetry face sheet for immediately available ER MD    Staff Present Earlean Shawl, BS, ACSM CEP, Exercise Physiologist;Meredith Sherryll Burger, RN BSN;Shawne Bulow, RN, BSN, CCRP;Joseph [place], RCP,RRT,BSRT    Virtual Visit No    Medication changes reported     No    Fall or balance concerns reported    No    Warm-up and Cool-down Performed on first and last piece of equipment    Resistance Training Performed Yes    VAD Patient? No    PAD/SET Patient? No      Pain Assessment   Currently in Pain? No/denies                Social History   Tobacco Use  Smoking Status Never  Smokeless Tobacco Never  Tobacco Comments   07/22/2018 "smoked some in college; 1960s;  nothing since"    Goals Met:  Proper associated with RPD/PD & O2 Sat Independence with exercise equipment Exercise tolerated well No report of concerns or symptoms today  Goals Unmet:  Not Applicable  Comments: Pt able to follow exercise prescription today without complaint.  Will continue to monitor for progression.    Dr. Emily Filbert is Medical Director for [place].  Dr. Ottie Glazier is Medical Director for [facility] Pulmonary Rehabilitation.

## 2021-10-12 ENCOUNTER — Ambulatory Visit (INDEPENDENT_AMBULATORY_CARE_PROVIDER_SITE_OTHER): Payer: Medicare Other

## 2021-10-12 ENCOUNTER — Other Ambulatory Visit: Payer: Self-pay

## 2021-10-12 DIAGNOSIS — I441 Atrioventricular block, second degree: Secondary | ICD-10-CM | POA: Diagnosis not present

## 2021-10-12 DIAGNOSIS — R0600 Dyspnea, unspecified: Secondary | ICD-10-CM

## 2021-10-12 LAB — CUP PACEART REMOTE DEVICE CHECK
Battery Remaining Longevity: 97 mo
Battery Remaining Percentage: 75 %
Battery Voltage: 2.99 V
Brady Statistic AP VP Percent: 3.6 %
Brady Statistic AP VS Percent: 6.5 %
Brady Statistic AS VP Percent: 38 %
Brady Statistic AS VS Percent: 49 %
Brady Statistic RA Percent Paced: 6.1 %
Brady Statistic RV Percent Paced: 41 %
Date Time Interrogation Session: 20230111061126
Implantable Lead Implant Date: 20191022
Implantable Lead Implant Date: 20191022
Implantable Lead Location: 753859
Implantable Lead Location: 753860
Implantable Pulse Generator Implant Date: 20191022
Lead Channel Impedance Value: 590 Ohm
Lead Channel Impedance Value: 730 Ohm
Lead Channel Pacing Threshold Amplitude: 0.5 V
Lead Channel Pacing Threshold Amplitude: 0.625 V
Lead Channel Pacing Threshold Pulse Width: 0.5 ms
Lead Channel Pacing Threshold Pulse Width: 0.5 ms
Lead Channel Sensing Intrinsic Amplitude: 12 mV
Lead Channel Sensing Intrinsic Amplitude: 4.9 mV
Lead Channel Setting Pacing Amplitude: 0.875
Lead Channel Setting Pacing Amplitude: 2 V
Lead Channel Setting Pacing Pulse Width: 0.5 ms
Lead Channel Setting Sensing Sensitivity: 2 mV
Pulse Gen Model: 2272
Pulse Gen Serial Number: 9073848

## 2021-10-12 NOTE — Progress Notes (Signed)
Daily Session Note  Patient Details  Name: AMRI LIEN MRN: [id number] Date of Birth: Sep 09, 1941 Referring Provider:   Flowsheet Row Pulmonary Rehab from 07/06/2021 in [facility] Cardiac and Pulmonary Rehab  Referring Provider Ida Rogue MD       Encounter Date: 10/12/2021  Check In:  Session Check In - 10/12/21 0914       Check-In   Supervising physician immediately available to respond to emergencies See telemetry face sheet for immediately available ER MD    Location [facility]-Cardiac & Pulmonary Rehab    Staff Present Birdie Sons, MPA, RN;Joseph [place], RCP,RRT,BSRT;Jessica [place], MA, RCEP, CCRP, CCET    Virtual Visit No    Medication changes reported     No    Fall or balance concerns reported    No    Warm-up and Cool-down Performed on first and last piece of equipment    Resistance Training Performed No    VAD Patient? No    PAD/SET Patient? No      Pain Assessment   Currently in Pain? No/denies                Social History   Tobacco Use  Smoking Status Never  Smokeless Tobacco Never  Tobacco Comments   07/22/2018 "smoked some in college; 1960s;  nothing since"    Goals Met:  Independence with exercise equipment Exercise tolerated well No report of concerns or symptoms today Strength training completed today  Goals Unmet:  Not Applicable  Comments: Pt able to follow exercise prescription today without complaint.  Will continue to monitor for progression.    Dr. Emily Filbert is Medical Director for [place].  Dr. Ottie Glazier is Medical Director for [facility] Pulmonary Rehabilitation.

## 2021-10-14 ENCOUNTER — Other Ambulatory Visit: Payer: Self-pay

## 2021-10-14 ENCOUNTER — Encounter: Payer: Medicare Other | Admitting: *Deleted

## 2021-10-14 DIAGNOSIS — R0600 Dyspnea, unspecified: Secondary | ICD-10-CM

## 2021-10-14 NOTE — Progress Notes (Signed)
Daily Session Note  Patient Details  Name: Alison Huffman MRN: [id number] Date of Birth: [birth year] Referring Provider:   Flowsheet Row Pulmonary Rehab from 07/06/2021 in [facility] Cardiac and Pulmonary Rehab  Referring Provider Ida Rogue MD       Encounter Date: 10/14/2021  Check In:  Session Check In - 10/14/21 0914       Check-In   Supervising physician immediately available to respond to emergencies See telemetry face sheet for immediately available ER MD    Location [facility]-Cardiac & Pulmonary Rehab    Staff Present Renita Papa, RN BSN;Joseph [place], RCP,RRT,BSRT;Jessica [place], [place], RCEP, CCRP, CCET    Virtual Visit No    Medication changes reported     No    Fall or balance concerns reported    No    Warm-up and Cool-down Performed on first and last piece of equipment    Resistance Training Performed Yes    VAD Patient? No    PAD/SET Patient? No      Pain Assessment   Currently in Pain? No/denies                Social History   Tobacco Use  Smoking Status Never  Smokeless Tobacco Never  Tobacco Comments   07/22/2018 "smoked some in college; 1960s;  nothing since"    Goals Met:  Independence with exercise equipment Exercise tolerated well No report of concerns or symptoms today Strength training completed today  Goals Unmet:  Not Applicable  Comments: Pt able to follow exercise prescription today without complaint.  Will continue to monitor for progression.    Dr. Emily Filbert is Medical Director for [place].  Dr. Ottie Glazier is Medical Director for [facility] Pulmonary Rehabilitation.

## 2021-10-18 ENCOUNTER — Telehealth: Payer: Self-pay

## 2021-10-18 NOTE — Telephone Encounter (Signed)
Pt calling; is being f/u for ovarian cyst; usually has an u/s and blood work done before seeing CRS; the end of Feb would be a good time for her to have these done.  Please adv pt of what to do.  [phone]

## 2021-10-19 ENCOUNTER — Other Ambulatory Visit: Payer: Self-pay

## 2021-10-19 VITALS — Ht 66.5 in | Wt 179.4 lb

## 2021-10-19 DIAGNOSIS — R0600 Dyspnea, unspecified: Secondary | ICD-10-CM

## 2021-10-19 NOTE — Progress Notes (Signed)
Daily Session Note  Patient Details  Name: Alison Huffman MRN: [id number] Date of Birth: 04/24/1941 Referring Provider:   Flowsheet Row Pulmonary Rehab from 07/06/2021 in [facility] Cardiac and Pulmonary Rehab  Referring Provider Ida Rogue MD       Encounter Date: 10/19/2021  Check In:  Session Check In - 10/19/21 0923       Check-In   Supervising physician immediately available to respond to emergencies See telemetry face sheet for immediately available ER MD    Location [facility]-Cardiac & Pulmonary Rehab    Staff Present Birdie Sons, MPA, RN;Joseph Tessie Fass, RCP,RRT,BSRT;Amanda Oletta Darter, BA, ACSM CEP, Exercise Physiologist    Virtual Visit No    Medication changes reported     No    Fall or balance concerns reported    No    Warm-up and Cool-down Performed on first and last piece of equipment    Resistance Training Performed Yes    VAD Patient? No    PAD/SET Patient? No      Pain Assessment   Currently in Pain? No/denies                Social History   Tobacco Use  Smoking Status Never  Smokeless Tobacco Never  Tobacco Comments   07/22/2018 "smoked some in college; 1960s;  nothing since"    Goals Met:  Independence with exercise equipment Exercise tolerated well No report of concerns or symptoms today Strength training completed today  Goals Unmet:  Not Applicable  Comments: Pt able to follow exercise prescription today without complaint.  Will continue to monitor for progression.    Dr. Emily Filbert is Medical Director for [place].  Dr. Ottie Glazier is Medical Director for [facility] Pulmonary Rehabilitation.

## 2021-10-20 NOTE — Patient Instructions (Signed)
Discharge Patient Instructions  Patient Details  Name: Alison Huffman MRN: [id number] Date of Birth: Sep 10, 1941 Referring Provider:  Minna Merritts, MD   Number of Visits: 65  Reason for Discharge:  Patient reached a stable level of exercise. Patient independent in their exercise. Patient has met program and personal goals.  Smoking History:  Social History   Tobacco Use  Smoking Status Never  Smokeless Tobacco Never  Tobacco Comments   07/22/2018 "smoked some in college; 1960s;  nothing since"    Diagnosis:  Dyspnea, unspecified type  Initial Exercise Prescription:  Initial Exercise Prescription - 07/06/21 1000       Date of Initial Exercise RX and Referring Provider   Date 07/06/21    Referring Provider Ida Rogue MD      Oxygen   Maintain Oxygen Saturation 88% or higher      Treadmill   MPH 1.6    Grade 0    Minutes 15    METs 2.23      Recumbant Bike   Level 1    RPM 60    Watts 15    Minutes 15    METs 1.6      NuStep   Level 1    SPM 80    Minutes 15    METs 1.6      Prescription Details   Frequency (times per week) 2    Duration Progress to 30 minutes of continuous aerobic without signs/symptoms of physical distress      Intensity   THRR 40-80% of Max Heartrate 94-124    Ratings of Perceived Exertion 11-13    Perceived Dyspnea 0-4      Progression   Progression Continue to progress workloads to maintain intensity without signs/symptoms of physical distress.      Resistance Training   Training Prescription Yes    Weight 3 lb    Reps 10-15             Discharge Exercise Prescription (Final Exercise Prescription Changes):  Exercise Prescription Changes - 10/17/21 1700       Response to Exercise   Blood Pressure (Admit) 104/60    Blood Pressure (Exit) 104/58    Heart Rate (Admit) 70 bpm    Heart Rate (Exercise) 112 bpm    Heart Rate (Exit) 70 bpm    Oxygen Saturation (Admit) 94 %    Oxygen Saturation (Exercise) 92 %     Oxygen Saturation (Exit) 94 %    Rating of Perceived Exertion (Exercise) 13    Perceived Dyspnea (Exercise) 2    Symptoms none    Duration Continue with 30 min of aerobic exercise without signs/symptoms of physical distress.    Intensity THRR unchanged      Progression   Progression Continue to progress workloads to maintain intensity without signs/symptoms of physical distress.    Average METs 3.2      Resistance Training   Training Prescription Yes    Weight 3 lb    Reps 10-15      Interval Training   Interval Training No      NuStep   Level 4    Minutes 15    METs 3.4      Track   Laps 37    Minutes 15    METs 3      Home Exercise Plan   Plans to continue exercise at Home (comment)   walking, community gym   Frequency Add 2 additional days to  program exercise sessions.    Initial Home Exercises Provided 08/03/21      Oxygen   Maintain Oxygen Saturation 88% or higher             Functional Capacity:  6 Minute Walk     Row Name 07/06/21 1041 10/19/21 1227       6 Minute Walk   Phase Initial Discharge    Distance 930 feet 1445 feet    Distance % Change -- 55 %    Distance Feet Change -- 515 ft    Walk Time 6 minutes 6 minutes    # of Rest Breaks 0 0    MPH 1.76 2.74    METS 1.67 2.87    RPE 13 13    Perceived Dyspnea  2 3    VO2 Peak 5.86 10.04    Symptoms Yes (comment) Yes (comment)    Comments SOB, fatigued legs SOB    Resting HR 65 bpm 68 bpm    Resting BP 136/76 106/68    Resting Oxygen Saturation  95 % 95 %    Exercise Oxygen Saturation  during 6 min walk 94 % 94 %    Max Ex. HR 99 bpm 109 bpm    Max Ex. BP 144/72 168/76    2 Minute Post BP 134/74 126/74      Interval HR   1 Minute HR 89 109    2 Minute HR 96 68    3 Minute HR 99 69    4 Minute HR 95 80    5 Minute HR 98 78    6 Minute HR 99 68    2 Minute Post HR 61 94    Interval Heart Rate? Yes Yes      Interval Oxygen   Interval Oxygen? Yes Yes    Baseline Oxygen  Saturation % 95 % 95 %    1 Minute Oxygen Saturation % 96 % 96 %    1 Minute Liters of Oxygen 0 L  RA 0 L    2 Minute Oxygen Saturation % 94 % 96 %    2 Minute Liters of Oxygen 0 L 0 L    3 Minute Oxygen Saturation % 95 % 96 %    3 Minute Liters of Oxygen 0 L 0 L    4 Minute Oxygen Saturation % 95 % 94 %    4 Minute Liters of Oxygen 0 L 0 L    5 Minute Oxygen Saturation % 95 % 97 %    5 Minute Liters of Oxygen 0 L 0 L    6 Minute Oxygen Saturation % 95 % 96 %    6 Minute Liters of Oxygen 0 L 0 L    2 Minute Post Oxygen Saturation % 96 % 96 %    2 Minute Post Liters of Oxygen 0 L 0 L             Nutrition & Weight - Outcomes:  Pre Biometrics - 07/06/21 1039       Pre Biometrics   Height 5' 6.5" (1.689 m)    Weight 178 lb 14.4 oz (81.1 kg)    BMI (Calculated) 28.45    Single Leg Stand 3.97 seconds             Post Biometrics - 10/19/21 1229        Post  Biometrics   Height 5' 6.5" (1.689 m)    Weight 179 lb 6.4  oz (81.4 kg)    BMI (Calculated) 28.53    Single Leg Stand 2.8 seconds             Nutrition:  Nutrition Therapy & Goals - 07/28/21 0926       Nutrition Therapy   Diet Heart healthy, low Na    Protein (specify units) 65g    Fiber 25 grams    Whole Grain Foods 3 servings    Saturated Fats 12 max. grams    Fruits and Vegetables 8 servings/day    Sodium 1.5 grams      Personal Nutrition Goals   Nutrition Goal ST: practice mindful eating and paying attention to hunger cues, try including lunch or satiating snack before dinner to avoid eating way past point of fullness LT: prevent over eating at dinner.    Comments [place] [facility] [employer]. 1 Meal per day- lunch or dinner. she feels she has a problem with overeating. She will make oatmeal or dry cereal (all bran or cheerios with lactaid, but usually skim milk) and sometimes eggs. She may bring something home from dinner or have a sandwich (chciken salad, grilled cheese, tuna salad on whole wheat or  white). She may also cook hamburger or salmon which she keeps in the fridge.Drinks: water and unsweet tea with meals. Meal includes entree with three vegetables, soup, and salad as well as dessert and she wants to stop ordering it especially since she does not enjoy it much. She feels these meals taste salty. Discussed heart healthy eatong and hunger/fullness. She would like to try eating before dinner - leftovers from the night before, a sandwich, crackers with peanut butter.      Intervention Plan   Intervention Prescribe, educate and counsel regarding individualized specific dietary modifications aiming towards targeted core components such as weight, hypertension, lipid management, diabetes, heart failure and other comorbidities.;Nutrition handout(s) given to patient.    Expected Outcomes Short Term Goal: Understand basic principles of dietary content, such as calories, fat, sodium, cholesterol and nutrients.;Short Term Goal: A plan has been developed with personal nutrition goals set during dietitian appointment.;Long Term Goal: Adherence to prescribed nutrition plan.              Goals reviewed with patient; copy given to patient.

## 2021-10-21 ENCOUNTER — Other Ambulatory Visit: Payer: Self-pay

## 2021-10-21 ENCOUNTER — Encounter: Payer: Medicare Other | Admitting: *Deleted

## 2021-10-21 ENCOUNTER — Other Ambulatory Visit: Payer: Self-pay | Admitting: Obstetrics and Gynecology

## 2021-10-21 DIAGNOSIS — R0600 Dyspnea, unspecified: Secondary | ICD-10-CM

## 2021-10-21 DIAGNOSIS — N83202 Unspecified ovarian cyst, left side: Secondary | ICD-10-CM

## 2021-10-21 DIAGNOSIS — N83299 Other ovarian cyst, unspecified side: Secondary | ICD-10-CM

## 2021-10-21 NOTE — Progress Notes (Signed)
Remote pacemaker transmission.   

## 2021-10-21 NOTE — Progress Notes (Signed)
Orders placed.

## 2021-10-21 NOTE — Telephone Encounter (Signed)
Orders placed, please schedule patient for [place] and lab and then visit 2 days after the labs and [place]. Thank you

## 2021-10-21 NOTE — Telephone Encounter (Signed)
Patient called and confirmed scheduled appointments

## 2021-10-21 NOTE — Progress Notes (Signed)
Daily Session Note ° °Patient Details  °Name: Alison Huffman °MRN: [id number] °Date of Birth: 05/15/1941 °Referring Provider:   °Flowsheet Row Pulmonary Rehab from 07/06/2021 in [facility]  °Referring Provider Gollan, Timothy MD  ° °  ° ° °Encounter Date: 10/21/2021 ° °Check In: ° Session Check In - 10/21/21 0924   ° °  ° Check-In  ° Supervising physician immediately available to respond to emergencies See telemetry face sheet for immediately available ER MD   ° Location [facility]   ° Staff Present Meredith Craven, RN BSN;Joseph Hood, RCP,RRT,BSRT;Jessica Hawkins, MA, RCEP, CCRP, CCET   ° Virtual Visit No   ° Medication changes reported     No   ° Fall or balance concerns reported    No   ° Warm-up and Cool-down Performed on first and last piece of equipment   ° Resistance Training Performed Yes   ° VAD Patient? No   ° PAD/SET Patient? No   °  ° Pain Assessment  ° Currently in Pain? No/denies   ° °  °  ° °  ° ° ° ° ° °Social History  ° °Tobacco Use  °Smoking Status Never  °Smokeless Tobacco Never  °Tobacco Comments  ° 07/22/2018 "smoked some in college; 1960s;  nothing since"  ° ° °Goals Met:  °Independence with exercise equipment °Exercise tolerated well °No report of concerns or symptoms today °Strength training completed today ° °Goals Unmet:  °Not Applicable ° °Comments: Pt able to follow exercise prescription today without complaint.  Will continue to monitor for progression. ° ° ° °Dr. Mark Miller is Medical Director for [facility].  °Dr. Fuad Aleskerov is Medical Director for [facility]. °

## 2021-10-21 NOTE — Telephone Encounter (Signed)
Contacted patient via phone. Left voicemail for patient to call back to confirm pre-scheduled appointments.

## 2021-10-26 ENCOUNTER — Ambulatory Visit: Payer: Medicare Other | Admitting: Physical Medicine and Rehabilitation

## 2021-10-26 ENCOUNTER — Other Ambulatory Visit: Payer: Self-pay

## 2021-10-26 ENCOUNTER — Encounter: Payer: Self-pay | Admitting: Cardiology

## 2021-10-26 ENCOUNTER — Encounter: Payer: Self-pay | Admitting: *Deleted

## 2021-10-26 ENCOUNTER — Ambulatory Visit (INDEPENDENT_AMBULATORY_CARE_PROVIDER_SITE_OTHER): Payer: Medicare Other | Admitting: Cardiology

## 2021-10-26 VITALS — BP 160/80 | HR 68 | Ht 66.5 in | Wt 182.0 lb

## 2021-10-26 DIAGNOSIS — Z950 Presence of cardiac pacemaker: Secondary | ICD-10-CM | POA: Diagnosis not present

## 2021-10-26 DIAGNOSIS — R0600 Dyspnea, unspecified: Secondary | ICD-10-CM | POA: Diagnosis not present

## 2021-10-26 DIAGNOSIS — I5032 Chronic diastolic (congestive) heart failure: Secondary | ICD-10-CM

## 2021-10-26 DIAGNOSIS — I441 Atrioventricular block, second degree: Secondary | ICD-10-CM

## 2021-10-26 NOTE — Progress Notes (Signed)
Daily Session Note  Patient Details  Name: Alison Huffman MRN: [id number] Date of Birth: 03-26-1941 Referring Provider:   Flowsheet Row Pulmonary Rehab from 07/06/2021 in [facility]  Referring Provider Ida Rogue MD       Encounter Date: 10/26/2021  Check In:  Session Check In - 10/26/21 0921       Check-In   Supervising physician immediately available to respond to emergencies See telemetry face sheet for immediately available ER MD    Location [facility]    Staff Present Birdie Sons, MPA, RN;Joseph [place], RCP,RRT,BSRT;Jessica [place], MA, RCEP, CCRP, CCET    Virtual Visit No    Medication changes reported     No    Fall or balance concerns reported    No    Warm-up and Cool-down Performed on first and last piece of equipment    Resistance Training Performed Yes    VAD Patient? No    PAD/SET Patient? No      Pain Assessment   Currently in Pain? No/denies                Social History   Tobacco Use  Smoking Status Never  Smokeless Tobacco Never  Tobacco Comments   07/22/2018 "smoked some in college; 1960s;  nothing since"    Goals Met:  Independence with exercise equipment Exercise tolerated well No report of concerns or symptoms today Strength training completed today  Goals Unmet:  Not Applicable  Comments: Pt able to follow exercise prescription today without complaint.  Will continue to monitor for progression.    Dr. Emily Filbert is Medical Director for [facility].  Dr. Ottie Glazier is Medical Director for [facility].

## 2021-10-26 NOTE — Patient Instructions (Signed)
Medications: Your physician recommends that you continue on your current medications as directed. Please refer to the Current Medication list given to you today. *If you need a refill on your cardiac medications before your next appointment, please call your pharmacy*  Lab Work: None. If you have labs (blood work) drawn today and your tests are completely normal, you will receive your results only by: MyChart Message (if you have MyChart) OR A paper copy in the mail If you have any lab test that is abnormal or we need to change your treatment, we will call you to review the results.  Testing/Procedures: None.  Follow-Up: At [facility], you and your health needs are our priority.  As part of our continuing mission to provide you with exceptional heart care, we have created designated Provider Care Teams.  These Care Teams include your primary Cardiologist (physician) and Advanced Practice Providers (APPs -  Physician Assistants and Nurse Practitioners) who all work together to provide you with the care you need, when you need it.  Your physician wants you to follow-up in: 12 months with one of the following Advanced Practice Providers on your designated Care Team:    Chris Berge, NP Ryan Dunn PA Cadence Furth PA    You will receive a reminder letter in the mail two months in advance. If you don't receive a letter, please call our office to schedule the follow-up appointment.  We recommend signing up for the patient portal called "MyChart".  Sign up information is provided on this After Visit Summary.  MyChart is used to connect with patients for Virtual Visits (Telemedicine).  Patients are able to view lab/test results, encounter notes, upcoming appointments, etc.  Non-urgent messages can be sent to your provider as well.   To learn more about what you can do with MyChart, go to [email or web address].    Any Other Special Instructions Will Be Listed Below (If Applicable).  

## 2021-10-26 NOTE — Progress Notes (Signed)
Pulmonary Individual Treatment Plan  Patient Details  Name: Alison Huffman MRN: [id number] Date of Birth: Nov 18, 1940 Referring Provider:   Flowsheet Row Pulmonary Rehab from 07/06/2021 in [facility] Cardiac and Pulmonary Rehab  Referring Provider Ida Rogue MD       Initial Encounter Date:  Flowsheet Row Pulmonary Rehab from 07/06/2021 in [facility] Cardiac and Pulmonary Rehab  Date 07/06/21       Visit Diagnosis: Dyspnea, unspecified type  Patient's Home Medications on Admission:  Current Outpatient Medications:    acetaminophen (TYLENOL) 325 MG tablet, Take 325-650 mg by mouth every 6 (six) hours as needed for mild pain or headache. , Disp: , Rfl:    clobetasol ointment (TEMOVATE) 0.05 %, , Disp: , Rfl:    famotidine (PEPCID) 20 MG tablet, Take 1 tablet by mouth 2 (two) times daily., Disp: , Rfl:    furosemide (LASIX) 20 MG tablet, Take 1 tablet (20 mg total) by mouth daily as needed for edema., Disp: 30 tablet, Rfl: 3   hydrocerin (EUCERIN) CREA, Apply 1 application topically 2 (two) times daily. To bilateral feet., Disp: , Rfl: 0   ketoconazole (NIZORAL) 2 % cream, Apply 1 application topically daily., Disp: 15 g, Rfl: 0   losartan (COZAAR) 25 MG tablet, Take 1 tablet (25 mg total) by mouth daily., Disp: 90 tablet, Rfl: 3   methylcellulose (CITRUCEL) oral powder, , Disp: , Rfl:    polyethylene glycol (MIRALAX / GLYCOLAX) 17 g packet, Take 17 g by mouth daily as needed for severe constipation., Disp: 14 each, Rfl: 0   tacrolimus (PROTOPIC) 0.1 % ointment, Apply topically 2 (two) times daily as needed (for psoriasis). , Disp: , Rfl:    Vitamin D, Ergocalciferol, 50 MCG (2000 UT) CAPS, Take by mouth., Disp: , Rfl:   Past Medical History: Past Medical History:  Diagnosis Date   Ankle pain, right    Atypical chest pain    a. 10/2018 MV: EF 57%. No ischemia/infarct.   Coronary artery disease    Diastolic dysfunction    a. 04/2018 Echo: EF 55-60%, no rwma, Gr1 DD. Triv AI, mild MR. Mod  dil LA. Nl RV fxn. PASP nl.   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 07/2018 s/p SJM Assurity MRI model PM2271 (Ser# [id number]).   Sleep apnea    "using nasal pilllows since ~ 12/2017" (07/22/2018)   TMJ (dislocation of temporomandibular joint)     Tobacco Use: Social History   Tobacco Use  Smoking Status Never  Smokeless Tobacco Never  Tobacco Comments   07/22/2018 "smoked some in college; 1960s;  nothing since"    Labs: Recent Review Flowsheet Data     Labs for ITP Cardiac and Pulmonary Rehab Latest Ref Rng & Units 03/24/2020 04/03/2020 04/06/2020 04/11/2020 11/09/2020   Cholestrol 0 - 200 mg/dL - - - - 234(H)  LDLCALC 0 - 99 mg/dL - - - - 159(H)   LDLDIRECT mg/dL - - - - -   HDL >39.00 mg/dL - - - - 52.80   Trlycerides 0.0 - 149.0 mg/dL - - - - 112.0   Hemoglobin A1c 4.8 - 5.6 % 6.1 6.0(H) - - -   PHART 7.350 - 7.450 - 7.374 7.349(L) 7.470(H) -   PCO2ART 32.0 - 48.0 mmHg - 34.9 48.2(H) 38.4 -   HCO3 20.0 - 28.0 mmol/L - 20.4 26.3 27.9 -   TCO2 22 - 32 mmol/L - 21(L) 28 29 -   ACIDBASEDEF 0.0 - 2.0 mmol/L - 4.0(H) - - -   O2SAT % - 100.0 96.0 98.0 -        Pulmonary Assessment Scores:  Pulmonary Assessment Scores     Row Name 07/06/21 1036 10/19/21 1057 10/19/21 1419     ADL UCSD   ADL Phase Entry Exit --   SOB Score total 62 37 --   Rest 0 0 --   Walk 1 0 --   Stairs 4 5 --   Bath 1 1 --   Dress 1 0 --   Shop 4 3 --     CAT Score   CAT  Score 9 9 --     mMRC Score   mMRC Score 1 -- 0            UCSD: Self-administered rating of dyspnea associated with activities of daily living (ADLs) 6-point scale (0 = "not at all" to 5 = "maximal or unable to do because of breathlessness")  Scoring Scores range from 0 to 120.  Minimally important difference is 5 units  CAT: CAT can identify the health impairment of COPD patients and is better correlated with disease progression.  CAT has a scoring range of zero to 40. The CAT score is classified into four groups of low (less than 10), medium (10 - 20), high (21-30) and very high (31-40) based on the impact level of disease on health status. A CAT score over 10 suggests significant symptoms.  A worsening CAT score could be explained by an exacerbation, poor medication adherence, poor inhaler technique, or progression of COPD or comorbid conditions.  CAT MCID is 2 points  mMRC: mMRC (Modified Medical Research Council) Dyspnea Scale is used to assess the degree of baseline functional disability in patients of respiratory disease due to dyspnea. No minimal important difference is established. A decrease in score of 1 point or greater is considered a positive change.   Pulmonary Function Assessment:   Exercise Target Goals: Exercise Program Goal: Individual exercise prescription set using results from initial 6 min walk test and THRR while considering  patients activity barriers and safety.   Exercise Prescription Goal: Initial exercise prescription builds to 30-45 minutes a day of aerobic activity, 2-3 days per week.  Home exercise guidelines will be given to patient during program as part of exercise prescription that the participant will acknowledge.  Education: Aerobic Exercise: - Group verbal and visual presentation on the components of exercise prescription. Introduces F.I.T.T principle from ACSM for exercise prescriptions.  Reviews F.I.T.T. principles of aerobic exercise  including progression. Written material given at graduation. Flowsheet Row Pulmonary Rehab from 10/19/2021 in [facility] Cardiac and Pulmonary Rehab  Date 07/28/21  Educator [facility]  Instruction Review Code 1- Verbalizes Understanding       Education: Resistance Exercise: - Group verbal and visual presentation on the components of exercise prescription. Introduces F.I.T.T principle from  ACSM for exercise prescriptions  Reviews F.I.T.T. principles of resistance exercise including progression. Written material given at graduation. Flowsheet Row Pulmonary Rehab from 10/19/2021 in [facility] Cardiac and Pulmonary Rehab  Date 10/05/21  Educator [facility]  Instruction Review Code 1- Verbalizes Understanding        Education: Exercise & Equipment Safety: - Individual verbal instruction and demonstration of equipment use and safety with use of the equipment. Flowsheet Row Pulmonary Rehab from 10/19/2021 in [facility] Cardiac and Pulmonary Rehab  Education need identified 07/06/21  Date 07/06/21  Educator [place]  Instruction Review Code 1- Verbalizes Understanding       Education: Exercise Physiology & General Exercise Guidelines: - Group verbal and written instruction with models to review the exercise physiology of the cardiovascular system and associated critical values. Provides general exercise guidelines with specific guidelines to those with heart or lung disease.  Flowsheet Row Pulmonary Rehab from 10/19/2021 in [facility] Cardiac and Pulmonary Rehab  Date 07/21/21  Educator [facility]  Instruction Review Code 1- Verbalizes Understanding       Education: Flexibility, Balance, Mind/Body Relaxation: - Group verbal and visual presentation with interactive activity on the components of exercise prescription. Introduces F.I.T.T principle from ACSM for exercise prescriptions. Reviews F.I.T.T. principles of flexibility and balance exercise training including progression. Also discusses the mind body connection.  Reviews various  relaxation techniques to help reduce and manage stress (i.e. Deep breathing, progressive muscle relaxation, and visualization). Balance handout provided to take home. Written material given at graduation. Flowsheet Row Pulmonary Rehab from 10/19/2021 in [facility] Cardiac and Pulmonary Rehab  Date 10/12/21  Educator [facility]  Instruction Review Code 1- Verbalizes Understanding       Activity Barriers & Risk Stratification:  Activity Barriers & Cardiac Risk Stratification - 07/06/21 1039       Activity Barriers & Cardiac Risk Stratification   Activity Barriers Joint Problems;Back Problems;Other (comment);Deconditioning;Muscular Weakness;Shortness of Breath    Comments Guillian Barre, targets feet and legs bilaterally             6 Minute Walk:  6 Minute Walk     Row Name 07/06/21 1041 10/19/21 1227       6 Minute Walk   Phase Initial Discharge    Distance 930 feet 1445 feet    Distance % Change -- 55 %    Distance Feet Change -- 515 ft    Walk Time 6 minutes 6 minutes    # of Rest Breaks 0 0    MPH 1.76 2.74    METS 1.67 2.87    RPE 13 13    Perceived Dyspnea  2 3    VO2 Peak 5.86 10.04    Symptoms Yes (comment) Yes (comment)    Comments SOB, fatigued legs SOB    Resting HR 65 bpm 68 bpm    Resting BP 136/76 106/68    Resting Oxygen Saturation  95 % 95 %    Exercise Oxygen Saturation  during 6 min walk 94 % 94 %    Max Ex. HR 99 bpm 109 bpm    Max Ex. BP 144/72 168/76    2 Minute Post BP 134/74 126/74      Interval HR   1 Minute HR 89 109    2 Minute HR 96 68    3 Minute HR 99 69    4 Minute HR 95 80    5 Minute HR 98 78    6 Minute HR 99 68    2  Minute Post HR 61 94    Interval Heart Rate? Yes Yes      Interval Oxygen   Interval Oxygen? Yes Yes    Baseline Oxygen Saturation % 95 % 95 %    1 Minute Oxygen Saturation % 96 % 96 %    1 Minute Liters of Oxygen 0 L  RA 0 L    2 Minute Oxygen Saturation % 94 % 96 %    2 Minute Liters of Oxygen 0 L 0 L    3 Minute  Oxygen Saturation % 95 % 96 %    3 Minute Liters of Oxygen 0 L 0 L    4 Minute Oxygen Saturation % 95 % 94 %    4 Minute Liters of Oxygen 0 L 0 L    5 Minute Oxygen Saturation % 95 % 97 %    5 Minute Liters of Oxygen 0 L 0 L    6 Minute Oxygen Saturation % 95 % 96 %    6 Minute Liters of Oxygen 0 L 0 L    2 Minute Post Oxygen Saturation % 96 % 96 %    2 Minute Post Liters of Oxygen 0 L 0 L            Oxygen Initial Assessment:  Oxygen Initial Assessment - 07/06/21 1035       Home Oxygen   Home Oxygen Device None    Sleep Oxygen Prescription CPAP    Home Exercise Oxygen Prescription None    Home Resting Oxygen Prescription None    Compliance with Home Oxygen Use Yes      Initial 6 min Walk   Oxygen Used None      Program Oxygen Prescription   Program Oxygen Prescription None      Intervention   Short Term Goals To learn and understand importance of monitoring SPO2 with pulse oximeter and demonstrate accurate use of the pulse oximeter.;To learn and understand importance of maintaining oxygen saturations>88%;To learn and demonstrate proper pursed lip breathing techniques or other breathing techniques.     Long  Term Goals Verbalizes importance of monitoring SPO2 with pulse oximeter and return demonstration;Maintenance of O2 saturations>88%;Exhibits proper breathing techniques, such as pursed lip breathing or other method taught during program session             Oxygen Re-Evaluation:  Oxygen Re-Evaluation     Row Name 07/12/21 1025 07/26/21 0929 08/11/21 0819 09/05/21 0929 10/07/21 0931     Program Oxygen Prescription   Program Oxygen Prescription _0      Home Oxygen   Home Oxygen Device _1    Sleep Oxygen Prescription _2    Home Exercise Oxygen Prescription _3    Home Resting Oxygen Prescription _4    Compliance with Home Oxygen Use _5       Goals/Expected Outcomes   Short Term Goals To learn and understand importance of monitoring SPO2 with pulse oximeter and demonstrate accurate use of the pulse oximeter.;To learn and understand importance of maintaining oxygen saturations>88%;To learn and demonstrate proper pursed lip breathing techniques or other breathing techniques.  To learn and understand importance of monitoring SPO2 with pulse oximeter and demonstrate accurate use of the pulse oximeter.;To learn and understand importance of maintaining oxygen saturations>88%;To learn and demonstrate proper pursed lip breathing techniques or other breathing techniques.  To learn and understand importance of  monitoring SPO2 with pulse oximeter and demonstrate accurate use of the pulse oximeter.;To learn and understand importance of maintaining oxygen saturations>88%;To learn and demonstrate proper pursed lip breathing techniques or other breathing techniques.  To learn and understand importance of monitoring SPO2 with pulse oximeter and demonstrate accurate use of the pulse oximeter.;To learn and understand importance of maintaining oxygen saturations>88%;To learn and demonstrate proper pursed lip breathing techniques or other breathing techniques.  To learn and understand importance of monitoring SPO2 with pulse oximeter and demonstrate accurate use of the pulse oximeter.;To learn and understand importance of maintaining oxygen saturations>88%;To learn and demonstrate proper pursed lip breathing techniques or other breathing techniques.    Long  Term Goals Verbalizes importance of monitoring SPO2 with pulse oximeter and return demonstration;Maintenance of O2 saturations>88%;Exhibits proper breathing techniques, such as pursed lip breathing or other method taught during program session Verbalizes importance of monitoring SPO2 with pulse oximeter and return demonstration;Maintenance of O2 saturations>88%;Exhibits proper breathing techniques, such as pursed lip  breathing or other method taught during program session Verbalizes importance of monitoring SPO2 with pulse oximeter and return demonstration;Maintenance of O2 saturations>88%;Exhibits proper breathing techniques, such as pursed lip breathing or other method taught during program session Verbalizes importance of monitoring SPO2 with pulse oximeter and return demonstration;Maintenance of O2 saturations>88%;Exhibits proper breathing techniques, such as pursed lip breathing or other method taught during program session Verbalizes importance of monitoring SPO2 with pulse oximeter and return demonstration;Maintenance of O2 saturations>88%;Exhibits proper breathing techniques, such as pursed lip breathing or other method taught during program session   Comments Reviewed PLB technique with pt.  Talked about how it works and it's importance in maintaining their exercise saturations. She reports no problems with the CPAP. She does not usually use PLB, but she has read about it before, reviewed how to do it. SHe has a pulse ox at home and reports it stays above 90 and has not been low. She is doing well with her CPAP.  She has not felt that she has needed her PLB.  She was encouraged to continue to practice using it to help when she does need it.  Usually, when she feels short of breath, she will slow down to catch her breath. She is doing well with her CPAP, she reports no issues right now (the tracker says she is only having two events at night now). She is practicing PLB and does feel like it helps when she is short of breath.  Usually, when she feels short of breath, she will slow down to catch her breath. Fraser Din is doing in rehab.  She is compliant with her CPAP each night.  She still gets short of breath while working and talking.  Sats are doing well.  She is good about using her PLB  and finds it helpful when trying to control her breathing.   Goals/Expected Outcomes Short: Become more profiecient at using PLB.   Long:  Become independent at using PLB. ST: practice PLB LT: become independent using PLB ST: practice PLB LT: become independent using PLB ST: practice PLB LT: become independent using PLB Short: continue to use PLB Long: Conitnued compliance with CPAP.            Oxygen Discharge (Final Oxygen Re-Evaluation):  Oxygen Re-Evaluation - 10/07/21 0931       Program Oxygen Prescription   Program Oxygen Prescription None      Home Oxygen   Home Oxygen Device None    Sleep Oxygen Prescription CPAP  Home Exercise Oxygen Prescription None    Home Resting Oxygen Prescription None    Compliance with Home Oxygen Use Yes      Goals/Expected Outcomes   Short Term Goals To learn and understand importance of monitoring SPO2 with pulse oximeter and demonstrate accurate use of the pulse oximeter.;To learn and understand importance of maintaining oxygen saturations>88%;To learn and demonstrate proper pursed lip breathing techniques or other breathing techniques.     Long  Term Goals Verbalizes importance of monitoring SPO2 with pulse oximeter and return demonstration;Maintenance of O2 saturations>88%;Exhibits proper breathing techniques, such as pursed lip breathing or other method taught during program session    Comments Fraser Din is doing in rehab.  She is compliant with her CPAP each night.  She still gets short of breath while working and talking.  Sats are doing well.  She is good about using her PLB  and finds it helpful when trying to control her breathing.    Goals/Expected Outcomes Short: continue to use PLB Long: Conitnued compliance with CPAP.             Initial Exercise Prescription:  Initial Exercise Prescription - 07/06/21 1000       Date of Initial Exercise RX and Referring Provider   Date 07/06/21    Referring Provider Ida Rogue MD      Oxygen   Maintain Oxygen Saturation 88% or higher      Treadmill   MPH 1.6    Grade 0    Minutes 15    METs 2.23      Recumbant Bike    Level 1    RPM 60    Watts 15    Minutes 15    METs 1.6      NuStep   Level 1    SPM 80    Minutes 15    METs 1.6      Prescription Details   Frequency (times per week) 2    Duration Progress to 30 minutes of continuous aerobic without signs/symptoms of physical distress      Intensity   THRR 40-80% of Max Heartrate 94-124    Ratings of Perceived Exertion 11-13    Perceived Dyspnea 0-4      Progression   Progression Continue to progress workloads to maintain intensity without signs/symptoms of physical distress.      Resistance Training   Training Prescription Yes    Weight 3 lb    Reps 10-15             Perform Capillary Blood Glucose checks as needed.  Exercise Prescription Changes:   Exercise Prescription Changes     Row Name 07/06/21 1000 07/25/21 1500 08/03/21 1100 08/09/21 1400 08/23/21 1400     Response to Exercise   Blood Pressure (Admit) 136/76 128/68 -- 120/62 118/62   Blood Pressure (Exercise) 144/72 130/80 -- 124/64 --   Blood Pressure (Exit) 134/74 124/68 -- 120/66 124/62   Heart Rate (Admit) 65 bpm 79 bpm -- 70 bpm 78 bpm   Heart Rate (Exercise) 99 bpm 102 bpm -- 96 bpm 87 bpm   Heart Rate (Exit) 61 bpm 70 bpm -- 72 bpm 81 bpm   Oxygen Saturation (Admit) 95 % 96 % -- 95 % 97 %   Oxygen Saturation (Exercise) 94 % 92 % -- 94 % 95 %   Oxygen Saturation (Exit) 96 % 96 % -- 96 % 95 %   Rating of Perceived Exertion (Exercise) 13 15 -- 13  12   Perceived Dyspnea (Exercise) 2 1 -- 2 1   Symptoms SOB, fatigued legs -- -- SOB --   Comments walk test results fourth session -- -- --   Duration -- Continue with 30 min of aerobic exercise without signs/symptoms of physical distress. -- Continue with 30 min of aerobic exercise without signs/symptoms of physical distress. Continue with 30 min of aerobic exercise without signs/symptoms of physical distress.   Intensity -- THRR unchanged -- THRR unchanged THRR unchanged     Progression   Progression -- Continue  to progress workloads to maintain intensity without signs/symptoms of physical distress. -- Continue to progress workloads to maintain intensity without signs/symptoms of physical distress. Continue to progress workloads to maintain intensity without signs/symptoms of physical distress.   Average METs -- 2 -- 2.14 2     Resistance Training   Training Prescription -- Yes -- Yes Yes   Weight -- 3 lb -- 3 lb 3 lb   Reps -- 10-15 -- 10-15 10-15     Interval Training   Interval Training -- -- -- No No     Treadmill   MPH -- -- -- 1.6 --   Grade -- -- -- 0 --   Minutes -- -- -- 15 --   METs -- -- -- 2.23 --     NuStep   Level -- -- -- 3 --   Minutes -- -- -- 15 --     Biostep-RELP   Level -- 1 -- 1 1   Minutes -- 15 -- 15 15   METs -- 2 -- 2 2     Track   Laps -- 20 -- 30 26   Minutes -- 15 -- 15 15   METs -- 2.09 -- 2.63 2.41     Home Exercise Plan   Plans to continue exercise at -- -- Home (comment)  walking, community gym Home (comment)  walking, community gym Home (comment)  walking, community gym   Frequency -- -- Add 2 additional days to program exercise sessions. Add 2 additional days to program exercise sessions. Add 2 additional days to program exercise sessions.   Initial Home Exercises Provided -- -- 08/03/21 08/03/21 08/03/21     Oxygen   Maintain Oxygen Saturation -- -- 88% or higher 88% or higher 88% or higher    Row Name 09/05/21 1100 09/20/21 0700 10/04/21 1600 10/17/21 1700       Response to Exercise   Blood Pressure (Admit) 120/68 122/60 104/60 104/60    Blood Pressure (Exit) 130/72 110/62 106/60 104/58    Heart Rate (Admit) 69 bpm 74 bpm 71 bpm 70 bpm    Heart Rate (Exercise) 99 bpm 84 bpm 109 bpm 112 bpm    Heart Rate (Exit) 78 bpm 72 bpm 83 bpm 70 bpm    Oxygen Saturation (Admit) 95 % 94 % 94 % 94 %    Oxygen Saturation (Exercise) 94 % 92 % 95 % 92 %    Oxygen Saturation (Exit) 96 % 94 % 95 % 94 %    Rating of Perceived Exertion (Exercise) _0 Perceived Dyspnea (Exercise) _1 Symptoms none none none none    Duration Continue with 30 min of aerobic exercise without signs/symptoms of physical distress. Continue with 30 min of aerobic exercise without signs/symptoms of physical distress. Continue with 30 min of aerobic exercise without signs/symptoms of physical distress. Continue with 30 min  of aerobic exercise without signs/symptoms of physical distress.    Intensity THRR unchanged THRR unchanged THRR unchanged THRR unchanged      Progression   Progression Continue to progress workloads to maintain intensity without signs/symptoms of physical distress. Continue to progress workloads to maintain intensity without signs/symptoms of physical distress. Continue to progress workloads to maintain intensity without signs/symptoms of physical distress. Continue to progress workloads to maintain intensity without signs/symptoms of physical distress.    Average METs 2.18 2.7 2.8 3.2      Resistance Training   Training Prescription Yes Yes Yes Yes    Weight 3 lb 3 lb 3 lb 3 lb    Reps 10-15 10-15 10-15 10-15      Interval Training   Interval Training No No No No      Treadmill   MPH -- 2.2 2.5 --    Grade -- -- 0.5 --    Minutes -- 15 15 --    METs -- 2.69 3.09 --      NuStep   Level -- _0 Minutes -- _1 METs -- 2.7 2.4 3.4      Arm Ergometer   Level 1.3 -- -- --    Minutes 15 -- -- --    METs 1.4 -- -- --      T5 Nustep   Level 1 -- -- --    Minutes 15 -- -- --    METs 1.9 -- -- --      Biostep-RELP   Level 1 -- -- --    Minutes 15 -- -- --    METs 2 -- -- --      Track   Laps 35 -- 35 37    Minutes 15 -- 15 15    METs 2.9 -- 2.9 3      Home Exercise Plan   Plans to continue exercise at Home (comment)  walking, community gym Home (comment)  walking, community gym Home (comment)  walking, community gym Home (comment)  walking, community gym    Frequency Add 2 additional days to program  exercise sessions. Add 2 additional days to program exercise sessions. Add 2 additional days to program exercise sessions. Add 2 additional days to program exercise sessions.    Initial Home Exercises Provided 08/03/21 08/03/21 08/03/21 08/03/21      Oxygen   Maintain Oxygen Saturation 88% or higher 88% or higher 88% or higher 88% or higher             Exercise Comments:   Exercise Comments     Row Name 07/12/21 6073           Exercise Comments First full day of exercise!  Patient was oriented to gym and equipment including functions, settings, policies, and procedures.  Patient's individual exercise prescription and treatment plan were reviewed.  All starting workloads were established based on the results of the 6 minute walk test done at initial orientation visit.  The plan for exercise progression was also introduced and progression will be customized based on patient's performance and goals.                Exercise Goals and Review:   Exercise Goals     Row Name 07/06/21 1047             Exercise Goals   Increase Physical Activity Yes       Intervention Provide advice, education, support  and counseling about physical activity/exercise needs.;Develop an individualized exercise prescription for aerobic and resistive training based on initial evaluation findings, risk stratification, comorbidities and participant's personal goals.       Expected Outcomes Short Term: Attend rehab on a regular basis to increase amount of physical activity.;Long Term: Add in home exercise to make exercise part of routine and to increase amount of physical activity.;Long Term: Exercising regularly at least 3-5 days a week.       Increase Strength and Stamina Yes       Intervention Provide advice, education, support and counseling about physical activity/exercise needs.;Develop an individualized exercise prescription for aerobic and resistive training based on initial evaluation findings,  risk stratification, comorbidities and participant's personal goals.       Expected Outcomes Short Term: Increase workloads from initial exercise prescription for resistance, speed, and METs.;Short Term: Perform resistance training exercises routinely during rehab and add in resistance training at home;Long Term: Improve cardiorespiratory fitness, muscular endurance and strength as measured by increased METs and functional capacity (6MWT)       Able to understand and use rate of perceived exertion (RPE) scale Yes       Intervention Provide education and explanation on how to use RPE scale       Expected Outcomes Short Term: Able to use RPE daily in rehab to express subjective intensity level;Long Term:  Able to use RPE to guide intensity level when exercising independently       Able to understand and use Dyspnea scale Yes       Intervention Provide education and explanation on how to use Dyspnea scale       Expected Outcomes Short Term: Able to use Dyspnea scale daily in rehab to express subjective sense of shortness of breath during exertion;Long Term: Able to use Dyspnea scale to guide intensity level when exercising independently       Knowledge and understanding of Target Heart Rate Range (THRR) Yes       Intervention Provide education and explanation of THRR including how the numbers were predicted and where they are located for reference       Expected Outcomes Short Term: Able to state/look up THRR;Long Term: Able to use THRR to govern intensity when exercising independently;Short Term: Able to use daily as guideline for intensity in rehab       Able to check pulse independently Yes       Intervention Provide education and demonstration on how to check pulse in carotid and radial arteries.;Review the importance of being able to check your own pulse for safety during independent exercise       Expected Outcomes Short Term: Able to explain why pulse checking is important during independent  exercise;Long Term: Able to check pulse independently and accurately       Understanding of Exercise Prescription Yes       Intervention Provide education, explanation, and written materials on patient's individual exercise prescription       Expected Outcomes Short Term: Able to explain program exercise prescription;Long Term: Able to explain home exercise prescription to exercise independently                Exercise Goals Re-Evaluation :  Exercise Goals Re-Evaluation     [place] Name 07/12/21 0350 07/25/21 1535 07/26/21 0931 08/03/21 1142 08/09/21 1446     Exercise Goal Re-Evaluation   Exercise Goals Review Increase Physical Activity;Able to understand and use rate of perceived exertion (RPE) scale;Knowledge and understanding of  Target Heart Rate Range (THRR);Understanding of Exercise Prescription;Increase Strength and Stamina;Able to check pulse independently;Able to understand and use Dyspnea scale Increase Physical Activity;Increase Strength and Stamina Increase Physical Activity;Increase Strength and Stamina Increase Physical Activity;Increase Strength and Stamina;Able to understand and use rate of perceived exertion (RPE) scale;Able to understand and use Dyspnea scale;Knowledge and understanding of Target Heart Rate Range (THRR);Able to check pulse independently;Understanding of Exercise Prescription Increase Physical Activity;Increase Strength and Stamina;Understanding of Exercise Prescription   Comments Reviewed RPE and dyspnea scales, THR and program prescription with pt today.  Pt voiced understanding and was given a copy of goals to take home. Fraser Din has tolerated exercise well in her first sessions.  Oxygen stays in the 90s.She has reached 20 laps on the track. She will walk at home, not structured, but she would like to change that - sometimes she does such as saturday she went for a long walk. EP has not gone over home exercise yet. Reviewed home exercise with pt today.  Pt plans to walk  and use gym in community for exercise.  The gym has a treadmill, elliptical, weights, and a NuStep.  Reviewed THR, pulse, RPE, sign and symptoms, pulse oximetery and when to call 911 or MD.  Also discussed weather considerations and indoor options.  Pt voiced understanding. Fraser Din is doing well in rehab.  She is up to 30 laps on the track.  We will continue to monitor her progress.   Expected Outcomes Short: Use RPE daily to regulate intensity. Long: Follow program prescription in THR. Short:  attend consistently Long:  improve overall stamina ST: EP to go over home exercise LT: exercise outside of rehab Short: Start to use gym in complex at least one extra day a week Long: Exercise independently on off days Short: Increase BioStep Long: Continue to improve stamina.    [place] Name 08/11/21 4132 08/23/21 1441 09/05/21 0931 09/05/21 1148 09/09/21 0947     Exercise Goal Re-Evaluation   Exercise Goals Review Increase Physical Activity;Increase Strength and Stamina;Understanding of Exercise Prescription Increase Physical Activity;Increase Strength and Stamina Increase Physical Activity;Increase Strength and Stamina Increase Physical Activity;Increase Strength and Stamina Increase Physical Activity;Increase Strength and Stamina;Understanding of Exercise Prescription   Comments Fraser Din is doing well in rehab. She is doing at home too.  She is going to gym and using NuStep and weight machines.  She went once last week and not this week.  We will continue to encourage her to exercise on her off days from rehab. Fraser Din continues to do well and walks between 26 and 32 laps on the track.  Oxygen stays in the mid 90s on room air. We will continue to monitor. Fraser Din is mostly walking around the neighborhood right now (1/2 mile: 65mnutes RPE 12, Dyspnea 2:  2x/week -weather permitting) PFraser Dinis doing well in rehab. She reached a max of 35 laps around the track! It would be beneficial to have her keep that consistent each time. She  incorporated the arm ergometer into her exercise prescription and tolerating it well.  Her oxygen sats are staying above 88% during exercise. Will continue to monitor. PFraser Dinis doing well in rehab.  She is feeling better overall.  She will walk outside on her off days when the weather is nice. She also does her weights at home.  She is recovering her strength and stamina.   Expected Outcomes Short: Continue to add in exercise on off days  Long: Continue to exercise independently Short:  maintain consistent exercise  Long:  build overall stamina Short:  maintain consistent exercise, at least 30 minutes sessions outside of rehab Long:  build overall stamina Short: Continue to build up laps on track Long: Continue to increase overall MET level Short: Continue to exercise on off days Long: COnitnue to improve stamina.    [place] Name 09/20/21 0741 10/04/21 1601 10/07/21 0923 10/17/21 1740       Exercise Goal Re-Evaluation   Exercise Goals Review Increase Physical Activity;Increase Strength and Stamina Increase Physical Activity;Increase Strength and Stamina;Understanding of Exercise Prescription Increase Physical Activity;Increase Strength and Stamina;Understanding of Exercise Prescription Increase Physical Activity;Increase Strength and Stamina    Comments Fraser Din attends consistently and has improved average MET level.  Staff will encourage trying 4 lb for strength work. Fraser Din is doing well in rehab. She is up to level 4 on the NuStep.  We will continue to encourage her to try 4lb again and continue to monitor her progress. Fraser Din is doing well in rehab.  She is getting in some exercise at home.  She is walking every other day inside for about 20 min and doing her handweights.  She has not been to gym recently.  She would lke to get back gym. She has been more social recently playing cards. Fraser Din is progressing well and is up to level 4 on T4 and reached 37 laps on the track!  She still uses 3 lb for strength - can move to 4  lb.  Staff will continue to monitor.    Expected Outcomes Short: try 4 lb Long:  continue to improve stamina Short: try 4 lb Long:  continue to improve stamina Short: Get back to gym again Long: Continue to improve stamina. Short: try 4 lb Long: continue to build stamina             Discharge Exercise Prescription (Final Exercise Prescription Changes):  Exercise Prescription Changes - 10/17/21 1700       Response to Exercise   Blood Pressure (Admit) 104/60    Blood Pressure (Exit) 104/58    Heart Rate (Admit) 70 bpm    Heart Rate (Exercise) 112 bpm    Heart Rate (Exit) 70 bpm    Oxygen Saturation (Admit) 94 %    Oxygen Saturation (Exercise) 92 %    Oxygen Saturation (Exit) 94 %    Rating of Perceived Exertion (Exercise) 13    Perceived Dyspnea (Exercise) 2    Symptoms none    Duration Continue with 30 min of aerobic exercise without signs/symptoms of physical distress.    Intensity THRR unchanged      Progression   Progression Continue to progress workloads to maintain intensity without signs/symptoms of physical distress.    Average METs 3.2      Resistance Training   Training Prescription Yes    Weight 3 lb    Reps 10-15      Interval Training   Interval Training No      NuStep   Level 4    Minutes 15    METs 3.4      Track   Laps 37    Minutes 15    METs 3      Home Exercise Plan   Plans to continue exercise at Home (comment)   walking, community gym   Frequency Add 2 additional days to program exercise sessions.    Initial Home Exercises Provided 08/03/21      Oxygen   Maintain Oxygen Saturation 88% or higher  Nutrition:  Target Goals: Understanding of nutrition guidelines, daily intake of sodium <1571m, cholesterol <20103m calories 30% from fat and 7% or less from saturated fats, daily to have 5 or more servings of fruits and vegetables.  Education: All About Nutrition: -Group instruction provided by verbal, written material,  interactive activities, discussions, models, and posters to present general guidelines for heart healthy nutrition including fat, fiber, MyPlate, the role of sodium in heart healthy nutrition, utilization of the nutrition label, and utilization of this knowledge for meal planning. Follow up email sent as well. Written material given at graduation.   Biometrics:  Pre Biometrics - 07/06/21 1039       Pre Biometrics   Height 5' 6.5" (1.689 m)    Weight 178 lb 14.4 oz (81.1 kg)    BMI (Calculated) 28.45    Single Leg Stand 3.97 seconds             Post Biometrics - 10/19/21 1229        Post  Biometrics   Height 5' 6.5" (1.689 m)    Weight 179 lb 6.4 oz (81.4 kg)    BMI (Calculated) 28.53    Single Leg Stand 2.8 seconds             Nutrition Therapy Plan and Nutrition Goals:  Nutrition Therapy & Goals - 07/28/21 0926       Nutrition Therapy   Diet Heart healthy, low Na    Protein (specify units) 65g    Fiber 25 grams    Whole Grain Foods 3 servings    Saturated Fats 12 max. grams    Fruits and Vegetables 8 servings/day    Sodium 1.5 grams      Personal Nutrition Goals   Nutrition Goal ST: practice mindful eating and paying attention to hunger cues, try including lunch or satiating snack before dinner to avoid eating way past point of fullness LT: prevent over eating at dinner.    Comments [place] [facility] [employer] Meal per day- lunch or dinner. she feels she has a problem with overeating. She will make oatmeal or dry cereal (all bran or cheerios with lactaid, but usually skim milk) and sometimes eggs. She may bring something home from dinner or have a sandwich (chciken salad, grilled cheese, tuna salad on whole wheat or white). She may also cook hamburger or salmon which she keeps in the fridge.Drinks: water and unsweet tea with meals. Meal includes entree with three vegetables, soup, and salad as well as dessert and she wants to stop ordering it especially since she does not  enjoy it much. She feels these meals taste salty. Discussed heart healthy eatong and hunger/fullness. She would like to try eating before dinner - leftovers from the night before, a sandwich, crackers with peanut butter.      Intervention Plan   Intervention Prescribe, educate and counsel regarding individualized specific dietary modifications aiming towards targeted core components such as weight, hypertension, lipid management, diabetes, heart failure and other comorbidities.;Nutrition handout(s) given to patient.    Expected Outcomes Short Term Goal: Understand basic principles of dietary content, such as calories, fat, sodium, cholesterol and nutrients.;Short Term Goal: A plan has been developed with personal nutrition goals set during dietitian appointment.;Long Term Goal: Adherence to prescribed nutrition plan.             Nutrition Assessments:  MEDIFICTS Score Key: ?70 Need to make dietary changes  40-70 Heart Healthy Diet ? 40 Therapeutic Level Cholesterol Diet  Flowsheet Row  Pulmonary Rehab from 10/19/2021 in [facility] Cardiac and Pulmonary Rehab  Picture Your Plate Total Score on Admission 61  Picture Your Plate Total Score on Discharge 60      Picture Your Plate Scores: <12 Unhealthy dietary pattern with much room for improvement. 41-50 Dietary pattern unlikely to meet recommendations for good health and room for improvement. 51-60 More healthful dietary pattern, with some room for improvement.  >60 Healthy dietary pattern, although there may be some specific behaviors that could be improved.   Nutrition Goals Re-Evaluation:  Nutrition Goals Re-Evaluation     [place] Name 08/11/21 [phone] 09/05/21 0935 10/07/21 0926         Goals   Nutrition Goal ST: practice mindful eating and paying attention to hunger cues, try including lunch or satiating snack before dinner to avoid eating way past point of fullness LT: prevent over eating at dinner. ST: practice mindful eating and paying  attention to hunger cues LT: prevent over eating at dinner. ST: practice mindful eating and paying attention to hunger cues LT: prevent over eating at dinner.     Comment Fraser Din is doing well in rehab.  She admits to having room to improve.  Her weakness is desserts.  She eats once a day served and it comes with dessert.  SHe has been trying to take it home to snack on versus eating all at once.  She is doing better about eating lunch and trying to focus on her hunger cues at night.  She continues to eat oatmeal or cereal for breakfast. Fraser Din reports doing well with nutrition and has been sometimes taking leftovers from dinner home. She now has yogurt that she will eat in between her meals. She is also including eggs with her breakfast instead of oatmeal (encouraged her she can include both). Fraser Din is doing well in rehab. She admits to eating too much as her weight is not changing but she does not feel that she is eating a lot.  She is also trying eliminate desserts.  We talked about switching to fruit for dessert.  She is working on her hunger cues more.     Expected Outcome Short: Continue to focus on hunger cues Long: continue to focus on heart healthy eating Short: Continue to focus on hunger cues Long: continue to focus on heart healthy eating Short: Try fruit for dessert. Long: Continue to work on hunger cues.              Nutrition Goals Discharge (Final Nutrition Goals Re-Evaluation):  Nutrition Goals Re-Evaluation - 10/07/21 0926       Goals   Nutrition Goal ST: practice mindful eating and paying attention to hunger cues LT: prevent over eating at dinner.    Comment Fraser Din is doing well in rehab. She admits to eating too much as her weight is not changing but she does not feel that she is eating a lot.  She is also trying eliminate desserts.  We talked about switching to fruit for dessert.  She is working on her hunger cues more.    Expected Outcome Short: Try fruit for dessert. Long: Continue to  work on hunger cues.             Psychosocial: Target Goals: Acknowledge presence or absence of significant depression and/or stress, maximize coping skills, provide positive support system. Participant is able to verbalize types and ability to use techniques and skills needed for reducing stress and depression.   Education: Stress, Anxiety, and Depression -  Group verbal and visual presentation to define topics covered.  Reviews how body is impacted by stress, anxiety, and depression.  Also discusses healthy ways to reduce stress and to treat/manage anxiety and depression.  Written material given at graduation. Flowsheet Row Pulmonary Rehab from 10/19/2021 in [facility] Cardiac and Pulmonary Rehab  Date 07/14/21  Educator [facility]  Instruction Review Code 1- [employer] Understanding       Education: Sleep Hygiene -Provides group verbal and written instruction about how sleep can affect your health.  Define sleep hygiene, discuss sleep cycles and impact of sleep habits. Review good sleep hygiene tips.    Initial Review & Psychosocial Screening:  Initial Psych Review & Screening - 07/01/21 1302       Initial Review   Current issues with None Identified      Family Dynamics   Good Support System? Yes   2 nieces not local, sister in [place], neighbors     Barriers   Psychosocial barriers to participate in program There are no identifiable barriers or psychosocial needs.;The patient should benefit from training in stress management and relaxation.      Screening Interventions   Interventions Encouraged to exercise;To provide support and resources with identified psychosocial needs;Provide feedback about the scores to participant    Expected Outcomes Short Term goal: Utilizing psychosocial counselor, staff and physician to assist with identification of specific Stressors or current issues interfering with healing process. Setting desired goal for each stressor or current issue  identified.;Long Term Goal: Stressors or current issues are controlled or eliminated.;Long Term goal: The participant improves quality of Life and PHQ9 Scores as seen by post scores and/or verbalization of changes;Short Term goal: Identification and review with participant of any Quality of Life or Depression concerns found by scoring the questionnaire.             Quality of Life Scores:  Scores of 19 and below usually indicate a poorer quality of life in these areas.  A difference of  2-3 points is a clinically meaningful difference.  A difference of 2-3 points in the total score of the Quality of Life Index has been associated with significant improvement in overall quality of life, self-image, physical symptoms, and general health in studies assessing change in quality of life.  PHQ-9: Recent Review Flowsheet Data     Depression screen [facility] 2/9 10/19/2021 09/01/2021 08/02/2021 07/06/2021 04/25/2021   Decreased Interest 0 0 0 1 0   Down, Depressed, Hopeless 0 0 0 0 0   PHQ - 2 Score 0 0 0 1 0   Altered sleeping 0 - 1 2 -   Tired, decreased energy 1 - 1 2 -   Change in appetite 1 - 1 1 -   Feeling bad or failure about yourself  0 - 0 0 -   Trouble concentrating 1 - 0 1 -   Moving slowly or fidgety/restless 0 - 0 1 -   Suicidal thoughts 0 - 0 0 -   PHQ-9 Score 3 - 3 8 -   Difficult doing work/chores Not difficult at all - Somewhat difficult Somewhat difficult -      Interpretation of Total Score  Total Score Depression Severity:  1-4 = Minimal depression, 5-9 = Mild depression, 10-14 = Moderate depression, 15-19 = Moderately severe depression, 20-27 = Severe depression   Psychosocial Evaluation and Intervention:  Psychosocial Evaluation - 07/01/21 1345       Psychosocial Evaluation & Interventions   Interventions Encouraged to  exercise with the program and follow exercise prescription    Comments 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  She was on the ventilator for 9 days and went through many weeks of rehab. She has regained most of her strength and living in independent living at the [facility]. The biggest symptom that hasn't resolved is her shortness of breath. 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 She has neices out of town that are supportive, her sister in [place], and a good neighborhood support group where she lives. She is hoping this program will help with her stamina and breathing.    Expected Outcomes Short: attend Pulmonary rehab for education and exercise. Long: develop and maintain positive self care habits.    Continue Psychosocial Services  Follow up required by staff             Psychosocial Re-Evaluation:  Psychosocial Re-Evaluation     [place] Name 07/26/21 [phone] 08/11/21 4562 09/05/21 0938 10/07/21 0925       Psychosocial Re-Evaluation   Current issues with Current Stress Concerns None Identified None Identified None Identified    Comments She reports not having any major stressors aside from her continuous shortness of breath. She likes to bake - things like banana bread to help reduce stress. She has friends and relatives that she relies on for support. She reports sleeping well - 8 hours at least. Fraser Din is doing well in rehab. Fraser Din still does some baking and she is getting ready to gear up for bake sale next week.  She currently denies any major stressors and she continues to sleep well. She is using her CPAP. Fraser Din reports doing well with no major stressors at this time and is happy with her progress, last year at this time she was using a rollator. She reports sleeping well and having no issues with the CPAP. Fraser Din has been doing well in rehab.  She has gotten back to playing games with her friends again. She is feeling good mentally and  sleeping good.  She has not had any problems using her CPAP.    Expected Outcomes ST: continue with coping tools for stress and attending rehab LT: maintain positive attitude short; Continue to exericse for mental boost Long: Conitnue to stay positive short; Continue to exericse for mental boost Long: Conitnue to stay positive Short: Continue to enjoy time with friends Long: continue to focus on positivie    Interventions Encouraged to attend Pulmonary Rehabilitation for the exercise Encouraged to attend Pulmonary Rehabilitation for the exercise Encouraged to attend Pulmonary Rehabilitation for the exercise Encouraged to attend Pulmonary Rehabilitation for the exercise    Continue Psychosocial Services  Follow up required by staff -- Follow up required by staff Follow up required by staff      Initial Review   Source of Stress Concerns Chronic Illness -- -- --             Psychosocial Discharge (Final Psychosocial Re-Evaluation):  Psychosocial Re-Evaluation - 10/07/21 0925       Psychosocial Re-Evaluation   Current issues with None Identified    Comments Fraser Din has been doing well in rehab.  She has gotten back to playing games with her friends again. She is feeling good mentally and sleeping good.  She  has not had any problems using her CPAP.    Expected Outcomes Short: Continue to enjoy time with friends Long: continue to focus on positivie    Interventions Encouraged to attend Pulmonary Rehabilitation for the exercise    Continue Psychosocial Services  Follow up required by staff             Education: Education Goals: Education classes will be provided on a weekly basis, covering required topics. Participant will state understanding/return demonstration of topics presented.  Learning Barriers/Preferences:  Learning Barriers/Preferences - 07/01/21 1307       Learning Barriers/Preferences   Learning Barriers None    Learning Preferences None             General  Pulmonary Education Topics:  Infection Prevention: - Provides verbal and written material to individual with discussion of infection control including proper hand washing and proper equipment cleaning during exercise session. Flowsheet Row Pulmonary Rehab from 10/19/2021 in [facility] Cardiac and Pulmonary Rehab  Education need identified 07/06/21  Date 07/06/21  Educator [place]  Instruction Review Code 1- Verbalizes Understanding       Falls Prevention: - Provides verbal and written material to individual with discussion of falls prevention and safety. Flowsheet Row Pulmonary Rehab from 10/19/2021 in [facility] Cardiac and Pulmonary Rehab  Education need identified 07/06/21  Date 07/06/21  Educator [place]  Instruction Review Code 1- Verbalizes Understanding       Chronic Lung Disease Review: - Group verbal instruction with posters, models, PowerPoint presentations and videos,  to review new updates, new respiratory medications, new advancements in procedures and treatments. Providing information on websites and "800" numbers for continued self-education. Includes information about supplement oxygen, available portable oxygen systems, continuous and intermittent flow rates, oxygen safety, concentrators, and Medicare reimbursement for oxygen. Explanation of Pulmonary Drugs, including class, frequency, complications, importance of spacers, rinsing mouth after steroid MDI's, and proper cleaning methods for nebulizers. Review of basic lung anatomy and physiology related to function, structure, and complications of lung disease. Review of risk factors. Discussion about methods for diagnosing sleep apnea and types of masks and machines for OSA. Includes a review of the use of types of environmental controls: home humidity, furnaces, filters, dust mite/pet prevention, HEPA vacuums. Discussion about weather changes, air quality and the benefits of nasal washing. Instruction on Warning signs, infection symptoms, calling  MD promptly, preventive modes, and value of vaccinations. Review of effective airway clearance, coughing and/or vibration techniques. Emphasizing that all should Create an Action Plan. Written material given at graduation. Flowsheet Row Pulmonary Rehab from 10/19/2021 in [facility] Cardiac and Pulmonary Rehab  Education need identified 07/06/21  Date 09/07/21  Educator [facility]  Instruction Review Code 1- Verbalizes Understanding       AED/CPR: - Group verbal and written instruction with the use of models to demonstrate the basic use of the AED with the basic ABC's of resuscitation.    Anatomy and Cardiac Procedures: - Group verbal and visual presentation and models provide information about basic cardiac anatomy and function. Reviews the testing methods done to diagnose heart disease and the outcomes of the test results. Describes the treatment choices: Medical Management, Angioplasty, or Coronary Bypass Surgery for treating various heart conditions including Myocardial Infarction, Angina, Valve Disease, and Cardiac Arrhythmias.  Written material given at graduation. Flowsheet Row Pulmonary Rehab from 10/19/2021 in [facility] Cardiac and Pulmonary Rehab  Date 10/05/21  Educator SB  Instruction Review Code 1- Verbalizes Understanding       Medication Safety: -  Group verbal and visual instruction to review commonly prescribed medications for heart and lung disease. Reviews the medication, class of the drug, and side effects. Includes the steps to properly store meds and maintain the prescription regimen.  Written material given at graduation. Flowsheet Row Pulmonary Rehab from 10/19/2021 in [facility] Cardiac and Pulmonary Rehab  Date 08/23/21  Educator SB  Instruction Review Code 1- Verbalizes Understanding       Other: -Provides group and verbal instruction on various topics (see comments)   Knowledge Questionnaire Score:  Knowledge Questionnaire Score - 10/19/21 1059       Knowledge Questionnaire  Score   Pre Score 15/18: O2 sat, Albuterol    Post Score 16/18              Core Components/Risk Factors/Patient Goals at Admission:  Personal Goals and Risk Factors at Admission - 07/06/21 1047       Core Components/Risk Factors/Patient Goals on Admission    Weight Management Yes;Weight Loss    Intervention Weight Management: Provide education and appropriate resources to help participant work on and attain dietary goals.;Weight Management: Develop a combined nutrition and exercise program designed to reach desired caloric intake, while maintaining appropriate intake of nutrient and fiber, sodium and fats, and appropriate energy expenditure required for the weight goal.;Weight Management/Obesity: Establish reasonable short term and long term weight goals.    Admit Weight 178 lb 14.4 oz (81.1 kg)    Goal Weight: Short Term 173 lb (78.5 kg)    Goal Weight: Long Term 168 lb (76.2 kg)    Expected Outcomes Short Term: Continue to assess and modify interventions until short term weight is achieved;Long Term: Adherence to nutrition and physical activity/exercise program aimed toward attainment of established weight goal;Weight Loss: Understanding of general recommendations for a balanced deficit meal plan, which promotes 1-2 lb weight loss per week and includes a negative energy balance of [phone] kcal/d;Understanding of distribution of calorie intake throughout the day with the consumption of 4-5 meals/snacks;Understanding recommendations for meals to include 15-35% energy as protein, 25-35% energy from fat, 35-60% energy from carbohydrates, less than 256m of dietary cholesterol, 20-35 gm of total fiber daily    Hypertension Yes    Intervention Provide education on lifestyle modifcations including regular physical activity/exercise, weight management, moderate sodium restriction and increased consumption of fresh fruit, vegetables, and low fat dairy, alcohol moderation, and smoking  cessation.;Monitor prescription use compliance.    Expected Outcomes Short Term: Continued assessment and intervention until BP is < 140/927mHG in hypertensive participants. < 130/8041mG in hypertensive participants with diabetes, heart failure or chronic kidney disease.;Long Term: Maintenance of blood pressure at goal levels.    Lipids Yes    Intervention Provide education and support for participant on nutrition & aerobic/resistive exercise along with prescribed medications to achieve LDL <41m35mDL >40mg71m Expected Outcomes Short Term: Participant states understanding of desired cholesterol values and is compliant with medications prescribed. Participant is following exercise prescription and nutrition guidelines.;Long Term: Cholesterol controlled with medications as prescribed, with individualized exercise RX and with personalized nutrition plan. Value goals: LDL < 41mg,3m > 40 mg.             Education:Diabetes - Individual verbal and written instruction to review signs/symptoms of diabetes, desired ranges of glucose level fasting, after meals and with exercise. Acknowledge that pre and post exercise glucose checks will be done for 3 sessions at entry of program.   Know Your Numbers and Heart  Failure: - Group verbal and visual instruction to discuss disease risk factors for cardiac and pulmonary disease and treatment options.  Reviews associated critical values for Overweight/Obesity, Hypertension, Cholesterol, and Diabetes.  Discusses basics of heart failure: signs/symptoms and treatments.  Introduces Heart Failure Zone chart for action plan for heart failure.  Written material given at graduation. Flowsheet Row Pulmonary Rehab from 10/19/2021 in [facility] Cardiac and Pulmonary Rehab  Date 09/01/21  Educator SB  Instruction Review Code 1- Verbalizes Understanding       Core Components/Risk Factors/Patient Goals Review:   Goals and Risk Factor Review     Row Name 07/26/21 0933  08/11/21 0817 09/05/21 0933 10/07/21 0928       Core Components/Risk Factors/Patient Goals Review   Personal Goals Review Hypertension;Lipids;Heart Failure;Improve shortness of breath with ADL's Hypertension;Lipids;Heart Failure;Improve shortness of breath with ADL's;Weight Management/Obesity Hypertension;Lipids;Heart Failure;Improve shortness of breath with ADL's;Weight Management/Obesity Hypertension;Lipids;Heart Failure;Improve shortness of breath with ADL's;Weight Management/Obesity    Review Fraser Din reports that her ADLs are harder due to her shortness of breath. 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 She denies any heart failure symptoms at this time. She checks her BP multiple times per week - 130/60-70. She has not met with RD yet. She is taking all her medication as directed and is not having any problems with them. Fraser Din is doing well in rehab. Her weight is down a pound since starting.  She is doing better with her breathing.  She denies any heart failure symptoms.  Her pressures are doing well and she is doing well with her meds.  Her breathing continues ot improve some days, but there are days where it does not feel like it at much. Fraser Din feels like her shortnes of breath has improved since starting rehab. She will sometimes take her BP at home 120-130/70 at home. She has an appointment with her PCP regarding leg swelling and shortness of breath - gave her lasix. Her weight has been stable, she weighs herself daily. Fraser Din is doing well in rehab. Her weight is staying the same.  Her breathing has good days and bad days.  Today, talking on the treadmill is more difficult than normal for her.  She did talk to her doctor about her leg swelling.  She was started on a direutic but she has not been able to tell much difference.  Her blood pressures are doing okay as she has not needed it more than every other day.    Expected Outcomes ST:  continue to monitor vitals LT: manage ADLs with less shortness of breath Short: continue to work on breathing Long: conitnue to monitor risk factors. Short: continue to work on breathing, see if lasix helps with leg swelling Long: conitnue to monitor risk factors. Short: Continue to get used to dieretic Long: Continue to montior risk factors.             Core Components/Risk Factors/Patient Goals at Discharge (Final Review):   Goals and Risk Factor Review - 10/07/21 0928       Core Components/Risk Factors/Patient Goals Review   Personal Goals Review Hypertension;Lipids;Heart Failure;Improve shortness of breath with ADL's;Weight Management/Obesity    Review Fraser Din is doing well in rehab. Her weight is staying the same.  Her breathing has good days and bad days.  Today, talking on the treadmill is more difficult than normal for her.  She did talk to her doctor about her leg  swelling.  She was started on a direutic but she has not been able to tell much difference.  Her blood pressures are doing okay as she has not needed it more than every other day.    Expected Outcomes Short: Continue to get used to dieretic Long: Continue to montior risk factors.             ITP Comments:  ITP Comments     Row Name 07/01/21 1343 07/06/21 1032 07/12/21 0811 07/28/21 0925 08/03/21 0708   ITP Comments Initial orientation completed. Diagnosis can be found in [facility] 8/22. EP orientation scheduled for Wednesday 10/5 at 9:30. Completed 6MWT and gym orientation. Initial ITP created and sent for review to Dr. Ottie Glazier, Medical Director. First full day of exercise!  Patient was oriented to gym and equipment including functions, settings, policies, and procedures.  Patient's individual exercise prescription and treatment plan were reviewed.  All starting workloads were established based on the results of the 6 minute walk test done at initial orientation visit.  The plan for exercise progression was also introduced  and progression will be customized based on patient's performance and goals. Complete initial RD consultation 30 Day review completed. Medical Director ITP review done, changes made as directed, and signed approval by Medical Director.    [place] Name 08/31/21 0655 09/28/21 0710 10/26/21 0853       ITP Comments 30 Day review completed. Medical Director ITP review done, changes made as directed, and signed approval by Medical Director. 30 Day review completed. Medical Director ITP review done, changes made as directed, and signed approval by Medical Director. 30 Day review completed. Medical Director ITP review done, changes made as directed, and signed approval by Medical Director.              Comments:

## 2021-10-26 NOTE — Progress Notes (Signed)
Electrophysiology Office Follow up Visit Note:    Date:  10/26/2021   ID:  BOWEN GOYAL, DOB 1941-03-08, MRN [id number]  PCP:  Leone Haven, MD   [facility] Electrophysiologist:  Vickie Epley, MD    Interval History:    Alison Huffman is a 81 y.o. female who presents for a follow up visit.  She was previously taken care of by Dr. Rayann Heman.  She has a history of permanent pacemaker that was implanted July 23, 2018.  She has no recent problems with her pacemaker system.  She is here for routine follow-up.       Past Medical History:  Diagnosis Date   Ankle pain, right    Atypical chest pain    a. 10/2018 MV: EF 57%. No ischemia/infarct.   Coronary artery disease    Diastolic dysfunction    a. 04/2018 Echo: EF 55-60%, no rwma, Gr1 DD. Triv AI, mild MR. Mod dil LA. Nl RV fxn. PASP nl.   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 07/2018 s/p SJM Assurity MRI model [id number] (Ser# [id number]).   Sleep apnea    "using nasal pilllows since ~ 12/2017" (07/22/2018)   TMJ (dislocation of temporomandibular joint)     Past Surgical History:  Procedure Laterality Date   APPENDECTOMY     BREAST CYST ASPIRATION Bilateral    BREAST CYST EXCISION Right 1978   benign   COLONOSCOPY     COLONOSCOPY WITH PROPOFOL N/A 07/10/2019   Procedure: COLONOSCOPY WITH PROPOFOL;  Surgeon: Lollie Sails, MD;  Location[facility] ENDOSCOPY;  Service: Endoscopy;  Laterality:  N/A;   ESOPHAGOGASTRODUODENOSCOPY (EGD) WITH PROPOFOL N/A 07/10/2019   Procedure: ESOPHAGOGASTRODUODENOSCOPY (EGD) WITH PROPOFOL;  Surgeon: Lollie Sails, MD;  Location: [facility] ENDOSCOPY;  Service: Endoscopy;  Laterality: N/A;   HYSTEROSCOPY WITH D & C N/A 11/05/2018   Procedure: DILATATION AND CURETTAGE /HYSTEROSCOPY;  Surgeon: Homero Fellers, MD;  Location: [facility] ORS;  Service: Gynecology;  Laterality: N/A;   INSERT / REPLACE / REMOVE PACEMAKER     PACEMAKER IMPLANT N/A 07/23/2018   SJM Assurity [id number] implanted by Dr Rayann Heman for mobitz II second degree AV block   UTERINE POLYPS REMOVAL      Current Medications: Current Meds  Medication Sig   acetaminophen (TYLENOL) 325 MG tablet Take 325-650 mg by mouth every 6 (six) hours as needed for mild pain or headache.    clobetasol ointment (TEMOVATE) 0.05 %    famotidine (PEPCID) 20 MG tablet Take 1 tablet by mouth 2 (two) times daily.   furosemide (LASIX) 20 MG tablet Take 1 tablet (20 mg total) by mouth daily as needed for edema.   hydrocerin (EUCERIN) CREA Apply 1 application topically 2 (two) times daily. To bilateral feet.   ketoconazole (NIZORAL) 2 % cream Apply 1 application topically daily.  losartan (COZAAR) 25 MG tablet Take 1 tablet (25 mg total) by mouth daily.   methylcellulose (CITRUCEL) oral powder    polyethylene glycol (MIRALAX / GLYCOLAX) 17 g packet Take 17 g by mouth daily as needed for severe constipation.   tacrolimus (PROTOPIC) 0.1 % ointment Apply topically 2 (two) times daily as needed (for psoriasis).    Vitamin D, Ergocalciferol, 50 MCG (2000 UT) CAPS Take by mouth.     Allergies:   Etanercept and Nickel   Social History   Socioeconomic History   Marital status: Single    Spouse name: Not on file   Number of children: Not on file   Years of education: Not on file   Highest education level: Not on file  Occupational History   Not on file  Tobacco Use   Smoking status: Never   Smokeless tobacco: Never    Tobacco comments:    07/22/2018 "smoked some in college; 1960s;  nothing since"  Vaping Use   Vaping Use: Never used  Substance and Sexual Activity   Alcohol use: Yes    Comment: rarely   Drug use: Never   Sexual activity: Not Currently    Birth control/protection: Post-menopausal  Other Topics Concern   Not on file  Social History Narrative   Right Handed   Lives in an apartment. 5th floor but has elevator    Drinks Caffeine.    Social Determinants of Health   Financial Resource Strain: Not on file  Food Insecurity: Not on file  Transportation Needs: Not on file  Physical Activity: Not on file  Stress: Not on file  Social Connections: Not on file     Family History: The patient's family history includes Atrial fibrillation in her mother; Breast cancer in her maternal aunt, mother, and paternal aunt; Stroke in her father and mother.  ROS:   Please see the history of present illness.    All other systems reviewed and are negative.  EKGs/Labs/Other Studies Reviewed:    The following studies were reviewed today:  October 26, 2021 in clinic device interrogation personally reviewed Stable lead parameters PVC burden 2.9% Battery longevity 8 years 110 AMS episodes with the longest recent episode on July 24, 2021 lasting 2 minutes 12 seconds  EKG:  The ekg ordered today demonstrates right bundle branch block, sinus rhythm, PVC, atrial pacing  Recent Labs: 02/09/2021: ALT 12; Pro B Natriuretic peptide (BNP) 44.0; TSH 1.55 05/23/2021: Hemoglobin 13.3; Platelets 224 09/22/2021: BUN 13; Creatinine, Ser 0.58; Potassium 3.9; Sodium 137  Recent Lipid Panel    Component Value Date/Time   CHOL 234 (H) 11/09/2020 0913   CHOL 156 04/11/2018 0900   TRIG 112.0 11/09/2020 0913   HDL 52.80 11/09/2020 0913   HDL 58 04/11/2018 0900   CHOLHDL 4 11/09/2020 0913   VLDL 22.4 11/09/2020 0913   LDLCALC 159 (H) 11/09/2020 0913   LDLCALC 84 04/11/2018 0900   LDLDIRECT 94.0 10/06/2019  0906    Physical Exam:    VS:  BP (!) 160/80 (BP Location: Left Arm, Patient Position: Sitting, Cuff Size: Normal)    Pulse 68    Ht 5' 6.5" (1.689 m)    Wt 182 lb (82.6 kg)    SpO2 93%    BMI 28.94 kg/m     Wt Readings from Last 3 Encounters:  10/26/21 182 lb (82.6 kg)  10/19/21 179 lb 6.4 oz (81.4 kg)  09/01/21 176 lb (79.8 kg)     GEN:  Well nourished, well  developed in no acute distress HEENT: Normal NECK: No JVD; No carotid bruits LYMPHATICS: No lymphadenopathy CARDIAC: RRR, no murmurs, rubs, gallops.  Pacemaker pocket well-healed RESPIRATORY:  Clear to auscultation without rales, wheezing or rhonchi  ABDOMEN: Soft, non-tender, non-distended MUSCULOSKELETAL:  No edema; No deformity  SKIN: Warm and dry NEUROLOGIC:  Alert and oriented x 3 PSYCHIATRIC:  Normal affect        ASSESSMENT:    1. Chronic diastolic congestive heart failure ([place])   2. Second degree AV block, Mobitz type II   3. Cardiac pacemaker in situ    PLAN:    In order of problems listed above:  #Second-degree AV block, Mobitz 2 #Pacemaker in situ Pacemaker functioning appropriately.  Continue remote interrogations.  I will plan to see her back in 1 year or sooner as needed.  #Chronic diastolic heart failure Euvolemic on exam today Continue current medical therapy  Follow-up 1 year with APP    Medication Adjustments/Labs and Tests Ordered: Current medicines are reviewed at length with the patient today.  Concerns regarding medicines are outlined above.  Orders Placed This Encounter  Procedures   EKG 12-Lead   No orders of the defined types were placed in this encounter.    Signed, Lars Mage, MD, [facility] 10/26/2021 8:03 PM    Electrophysiology [facility]

## 2021-10-28 ENCOUNTER — Encounter: Payer: Medicare Other | Admitting: *Deleted

## 2021-10-28 ENCOUNTER — Other Ambulatory Visit: Payer: Self-pay

## 2021-10-28 DIAGNOSIS — R0600 Dyspnea, unspecified: Secondary | ICD-10-CM

## 2021-10-28 NOTE — Progress Notes (Signed)
Daily Session Note  Patient Details  Name: Alison Huffman MRN: [id number] Date of Birth: [birth year] Referring Provider:   Flowsheet Row Pulmonary Rehab from 07/06/2021 in [facility] Cardiac and Pulmonary Rehab  Referring Provider Ida Rogue MD       Encounter Date: 10/28/2021  Check In:  Session Check In - 10/28/21 0926       Check-In   Supervising physician immediately available to respond to emergencies See telemetry face sheet for immediately available ER MD    Location [facility]-Cardiac & Pulmonary Rehab    Staff Present Renita Papa, RN BSN;Joseph Tessie Fass, RCP,RRT,BSRT;Melissa [place], [place], LDN    Virtual Visit No    Medication changes reported     No    Fall or balance concerns reported    No    Warm-up and Cool-down Performed on first and last piece of equipment    Resistance Training Performed Yes    VAD Patient? No    PAD/SET Patient? No      Pain Assessment   Currently in Pain? No/denies                Social History   Tobacco Use  Smoking Status Never  Smokeless Tobacco Never  Tobacco Comments   07/22/2018 "smoked some in college; 1960s;  nothing since"    Goals Met:  Independence with exercise equipment Exercise tolerated well No report of concerns or symptoms today Strength training completed today  Goals Unmet:  Not Applicable  Comments: Pt able to follow exercise prescription today without complaint.  Will continue to monitor for progression.    Dr. Emily Filbert is Medical Director for [facility].  Dr. Ottie Glazier is Medical Director for [facility] Pulmonary Rehabilitation.

## 2021-10-31 ENCOUNTER — Other Ambulatory Visit: Payer: Self-pay

## 2021-10-31 ENCOUNTER — Encounter: Payer: Medicare Other | Admitting: *Deleted

## 2021-10-31 DIAGNOSIS — R0600 Dyspnea, unspecified: Secondary | ICD-10-CM

## 2021-10-31 NOTE — Progress Notes (Signed)
Daily Session Note  Patient Details  Name: Alison Huffman MRN: [id number] Date of Birth: 1940/12/16 Referring Provider:   Flowsheet Row Pulmonary Rehab from 07/06/2021 in [facility] Cardiac and Pulmonary Rehab  Referring Provider Ida Rogue MD       Encounter Date: 10/31/2021  Check In:  Session Check In - 10/31/21 0929       Check-In   Supervising physician immediately available to respond to emergencies See telemetry face sheet for immediately available ER MD    Location [facility]-Cardiac & Pulmonary Rehab    Staff Present Carson Myrtle, BS, RRT, CPFT;Joseph Dunmor, [place];Heath Lark, RN, BSN, CCRP    Virtual Visit No    Medication changes reported     No    Fall or balance concerns reported    No    Warm-up and Cool-down Performed on first and last piece of equipment    Resistance Training Performed Yes    VAD Patient? No    PAD/SET Patient? No      Pain Assessment   Currently in Pain? No/denies                Social History   Tobacco Use  Smoking Status Never  Smokeless Tobacco Never  Tobacco Comments   07/22/2018 "smoked some in college; 1960s;  nothing since"    Goals Met:  Proper associated with RPD/PD & O2 Sat Independence with exercise equipment Exercise tolerated well Personal goals reviewed No report of concerns or symptoms today  Goals Unmet:  Not Applicable  Comments: Pt able to follow exercise prescription today without complaint.  Will continue to monitor for progression.    Dr. Emily Filbert is Medical Director for [facility].  Dr. Ottie Glazier is Medical Director for [facility] Pulmonary Rehabilitation.

## 2021-11-02 ENCOUNTER — Encounter: Payer: Medicare Other | Attending: Cardiovascular Disease

## 2021-11-02 ENCOUNTER — Other Ambulatory Visit: Payer: Self-pay

## 2021-11-02 DIAGNOSIS — R0600 Dyspnea, unspecified: Secondary | ICD-10-CM | POA: Insufficient documentation

## 2021-11-02 NOTE — Progress Notes (Signed)
Daily Session Note  Patient Details  Name: Alison Huffman MRN: [id number] Date of Birth: Jun 19, 1941 Referring Provider:   Flowsheet Row Pulmonary Rehab from 07/06/2021 in [facility] Cardiac and Pulmonary Rehab  Referring Provider Ida Rogue MD       Encounter Date: 11/02/2021  Check In:  Session Check In - 11/02/21 0916       Check-In   Supervising physician immediately available to respond to emergencies See telemetry face sheet for immediately available ER MD    Location [facility]-Cardiac & Pulmonary Rehab    Staff Present Birdie Sons, MPA, RN;Joseph Tessie Fass, RCP,RRT,BSRT;Amanda Oletta Darter, BA, ACSM CEP, Exercise Physiologist    Virtual Visit No    Medication changes reported     No    Fall or balance concerns reported    No    Warm-up and Cool-down Performed on first and last piece of equipment    Resistance Training Performed Yes    VAD Patient? No    PAD/SET Patient? No      Pain Assessment   Currently in Pain? No/denies                Social History   Tobacco Use  Smoking Status Never  Smokeless Tobacco Never  Tobacco Comments   07/22/2018 "smoked some in college; 1960s;  nothing since"    Goals Met:  Independence with exercise equipment Exercise tolerated well No report of concerns or symptoms today Strength training completed today  Goals Unmet:  Not Applicable  Comments:  Infant graduated today from  rehab with 36 sessions completed.  Details of the patient's exercise prescription and what She needs to do in order to continue the prescription and progress were discussed with patient.  Patient was given a copy of prescription and goals.  Patient verbalized understanding.  Zakaiya plans to continue to exercise by walking at home and going to the [employer].    Dr. Emily Filbert is Medical Director for [place].  Dr. Ottie Glazier is Medical Director for [facility] Pulmonary Rehabilitation.

## 2021-11-02 NOTE — Progress Notes (Signed)
Discharge Progress Report  Patient Details  Name: Alison Huffman MRN: [id number] Date of Birth: 1940-12-11 Referring Provider:   Flowsheet Row Pulmonary Rehab from 07/06/2021 in [facility] Cardiac and Pulmonary Rehab  Referring Provider Ida Rogue MD        Number of Visits: 36  Reason for Discharge:  Patient reached a stable level of exercise. Patient independent in their exercise. Patient has met program and personal goals.  Smoking History:  Social History   Tobacco Use  Smoking Status Never  Smokeless Tobacco Never  Tobacco Comments   07/22/2018 "smoked some in college; 1960s;  nothing since"    Diagnosis:  Dyspnea, unspecified type  ADL [facility]:  Pulmonary Assessment Scores     Row Name 07/06/21 1036 10/19/21 1057 10/19/21 1419     ADL [facility]   ADL Phase Entry Exit --   SOB Score total 62 37 --   Rest 0 0 --   Walk 1 0 --   Stairs 4 5 --   Bath 1 1 --   Dress 1 0 --   Shop 4 3 --     CAT Score   CAT Score 9 9 --     mMRC Score   mMRC Score 1 -- 0            Initial Exercise Prescription:  Initial Exercise Prescription - 07/06/21 1000       Date of Initial Exercise RX and Referring Provider   Date 07/06/21    Referring Provider Ida Rogue MD      Oxygen   Maintain Oxygen Saturation 88% or higher      Treadmill   MPH 1.6    Grade 0    Minutes 15    METs 2.23      Recumbant Bike   Level 1    RPM 60    Watts 15    Minutes 15    METs 1.6      NuStep   Level 1    SPM 80    Minutes 15    METs 1.6      Prescription Details   Frequency (times per week) 2    Duration Progress to 30 minutes of continuous aerobic without signs/symptoms of physical distress      Intensity   THRR 40-80% of Max Heartrate 94-124    Ratings of Perceived Exertion 11-13    Perceived Dyspnea 0-4      Progression   Progression Continue to progress workloads to maintain intensity without signs/symptoms of physical distress.      Resistance Training    Training Prescription Yes    Weight 3 lb    Reps 10-15             Discharge Exercise Prescription (Final Exercise Prescription Changes):  Exercise Prescription Changes - 10/17/21 1700       Response to Exercise   Blood Pressure (Admit) 104/60    Blood Pressure (Exit) 104/58    Heart Rate (Admit) 70 bpm    Heart Rate (Exercise) 112 bpm    Heart Rate (Exit) 70 bpm    Oxygen Saturation (Admit) 94 %    Oxygen Saturation (Exercise) 92 %    Oxygen Saturation (Exit) 94 %    Rating of Perceived Exertion (Exercise) 13    Perceived Dyspnea (Exercise) 2    Symptoms none    Duration Continue with 30 min of aerobic exercise without signs/symptoms of physical distress.    Intensity THRR unchanged  Progression   Progression Continue to progress workloads to maintain intensity without signs/symptoms of physical distress.    Average METs 3.2      Resistance Training   Training Prescription Yes    Weight 3 lb    Reps 10-15      Interval Training   Interval Training No      NuStep   Level 4    Minutes 15    METs 3.4      Track   Laps 37    Minutes 15    METs 3      Home Exercise Plan   Plans to continue exercise at Home (comment)   walking, community gym   Frequency Add 2 additional days to program exercise sessions.    Initial Home Exercises Provided 08/03/21      Oxygen   Maintain Oxygen Saturation 88% or higher             Functional Capacity:  6 Minute Walk     Row Name 07/06/21 1041 10/19/21 1227       6 Minute Walk   Phase Initial Discharge    Distance 930 feet 1445 feet    Distance % Change -- 55 %    Distance Feet Change -- 515 ft    Walk Time 6 minutes 6 minutes    # of Rest Breaks 0 0    MPH 1.76 2.74    METS 1.67 2.87    RPE 13 13    Perceived Dyspnea  2 3    VO2 Peak 5.86 10.04    Symptoms Yes (comment) Yes (comment)    Comments SOB, fatigued legs SOB    Resting HR 65 bpm 68 bpm    Resting BP 136/76 106/68    Resting Oxygen  Saturation  95 % 95 %    Exercise Oxygen Saturation  during 6 min walk 94 % 94 %    Max Ex. HR 99 bpm 109 bpm    Max Ex. BP 144/72 168/76    2 Minute Post BP 134/74 126/74      Interval HR   1 Minute HR 89 109    2 Minute HR 96 68    3 Minute HR 99 69    4 Minute HR 95 80    5 Minute HR 98 78    6 Minute HR 99 68    2 Minute Post HR 61 94    Interval Heart Rate? Yes Yes      Interval Oxygen   Interval Oxygen? Yes Yes    Baseline Oxygen Saturation % 95 % 95 %    1 Minute Oxygen Saturation % 96 % 96 %    1 Minute Liters of Oxygen 0 L  RA 0 L    2 Minute Oxygen Saturation % 94 % 96 %    2 Minute Liters of Oxygen 0 L 0 L    3 Minute Oxygen Saturation % 95 % 96 %    3 Minute Liters of Oxygen 0 L 0 L    4 Minute Oxygen Saturation % 95 % 94 %    4 Minute Liters of Oxygen 0 L 0 L    5 Minute Oxygen Saturation % 95 % 97 %    5 Minute Liters of Oxygen 0 L 0 L    6 Minute Oxygen Saturation % 95 % 96 %    6 Minute Liters of Oxygen 0 L 0 L  2 Minute Post Oxygen Saturation % 96 % 96 %    2 Minute Post Liters of Oxygen 0 L 0 L             Psychological, QOL, Others - Outcomes: PHQ 2/9: Depression screen [facility] 2/9 10/19/2021 09/01/2021 08/02/2021 07/06/2021 04/25/2021  Decreased Interest 0 0 0 1 0  Down, Depressed, Hopeless 0 0 0 0 0  PHQ - 2 Score 0 0 0 1 0  Altered sleeping 0 - 1 2 -  Tired, decreased energy 1 - 1 2 -  Change in appetite 1 - 1 1 -  Feeling bad or failure about yourself  0 - 0 0 -  Trouble concentrating 1 - 0 1 -  Moving slowly or fidgety/restless 0 - 0 1 -  Suicidal thoughts 0 - 0 0 -  PHQ-9 Score 3 - 3 8 -  Difficult doing work/chores Not difficult at all - Somewhat difficult Somewhat difficult -  Some recent data might be hidden    Quality of Life:      Nutrition & Weight - Outcomes:  Pre Biometrics - 07/06/21 1039       Pre Biometrics   Height 5' 6.5" (1.689 m)    Weight 178 lb 14.4 oz (81.1 kg)    BMI (Calculated) 28.45    Single Leg Stand  3.97 seconds             Post Biometrics - 10/19/21 1229        Post  Biometrics   Height 5' 6.5" (1.689 m)    Weight 179 lb 6.4 oz (81.4 kg)    BMI (Calculated) 28.53    Single Leg Stand 2.8 seconds             Nutrition:  Nutrition Therapy & Goals - 07/28/21 0926       Nutrition Therapy   Diet Heart healthy, low Na    Protein (specify units) 65g    Fiber 25 grams    Whole Grain Foods 3 servings    Saturated Fats 12 max. grams    Fruits and Vegetables 8 servings/day    Sodium 1.5 grams      Personal Nutrition Goals   Nutrition Goal ST: practice mindful eating and paying attention to hunger cues, try including lunch or satiating snack before dinner to avoid eating way past point of fullness LT: prevent over eating at dinner.    Comments [place] [facility] [employer]. 1 Meal per day- lunch or dinner. she feels she has a problem with overeating. She will make oatmeal or dry cereal (all bran or cheerios with lactaid, but usually skim milk) and sometimes eggs. She may bring something home from dinner or have a sandwich (chciken salad, grilled cheese, tuna salad on whole wheat or white). She may also cook hamburger or salmon which she keeps in the fridge.Drinks: water and unsweet tea with meals. Meal includes entree with three vegetables, soup, and salad as well as dessert and she wants to stop ordering it especially since she does not enjoy it much. She feels these meals taste salty. Discussed heart healthy eatong and hunger/fullness. She would like to try eating before dinner - leftovers from the night before, a sandwich, crackers with peanut butter.      Intervention Plan   Intervention Prescribe, educate and counsel regarding individualized specific dietary modifications aiming towards targeted core components such as weight, hypertension, lipid management, diabetes, heart failure and other comorbidities.;Nutrition handout(s) given to patient.  Expected Outcomes Short Term Goal:  Understand basic principles of dietary content, such as calories, fat, sodium, cholesterol and nutrients.;Short Term Goal: A plan has been developed with personal nutrition goals set during dietitian appointment.;Long Term Goal: Adherence to prescribed nutrition plan.             Nutrition Discharge:   Education Questionnaire Score:  Knowledge Questionnaire Score - 10/19/21 1059       Knowledge Questionnaire Score   Pre Score 15/18: O2 sat, Albuterol    Post Score 16/18             Goals reviewed with patient; copy given to patient.

## 2021-11-02 NOTE — Progress Notes (Signed)
Pulmonary Individual Treatment Plan  Patient Details  Name: Alison Huffman MRN: [id number] Date of Birth: Nov 18, 1940 Referring Provider:   Flowsheet Row Pulmonary Rehab from 07/06/2021 in [facility] Cardiac and Pulmonary Rehab  Referring Provider Ida Rogue MD       Initial Encounter Date:  Flowsheet Row Pulmonary Rehab from 07/06/2021 in [facility] Cardiac and Pulmonary Rehab  Date 07/06/21       Visit Diagnosis: Dyspnea, unspecified type  Patient's Home Medications on Admission:  Current Outpatient Medications:    acetaminophen (TYLENOL) 325 MG tablet, Take 325-650 mg by mouth every 6 (six) hours as needed for mild pain or headache. , Disp: , Rfl:    clobetasol ointment (TEMOVATE) 0.05 %, , Disp: , Rfl:    famotidine (PEPCID) 20 MG tablet, Take 1 tablet by mouth 2 (two) times daily., Disp: , Rfl:    furosemide (LASIX) 20 MG tablet, Take 1 tablet (20 mg total) by mouth daily as needed for edema., Disp: 30 tablet, Rfl: 3   hydrocerin (EUCERIN) CREA, Apply 1 application topically 2 (two) times daily. To bilateral feet., Disp: , Rfl: 0   ketoconazole (NIZORAL) 2 % cream, Apply 1 application topically daily., Disp: 15 g, Rfl: 0   losartan (COZAAR) 25 MG tablet, Take 1 tablet (25 mg total) by mouth daily., Disp: 90 tablet, Rfl: 3   methylcellulose (CITRUCEL) oral powder, , Disp: , Rfl:    polyethylene glycol (MIRALAX / GLYCOLAX) 17 g packet, Take 17 g by mouth daily as needed for severe constipation., Disp: 14 each, Rfl: 0   tacrolimus (PROTOPIC) 0.1 % ointment, Apply topically 2 (two) times daily as needed (for psoriasis). , Disp: , Rfl:    Vitamin D, Ergocalciferol, 50 MCG (2000 UT) CAPS, Take by mouth., Disp: , Rfl:   Past Medical History: Past Medical History:  Diagnosis Date   Ankle pain, right    Atypical chest pain    a. 10/2018 MV: EF 57%. No ischemia/infarct.   Coronary artery disease    Diastolic dysfunction    a. 04/2018 Echo: EF 55-60%, no rwma, Gr1 DD. Triv AI, mild MR. Mod  dil LA. Nl RV fxn. PASP nl.   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 07/2018 s/p SJM Assurity MRI model PM2271 (Ser# [id number]).   Sleep apnea    "using nasal pilllows since ~ 12/2017" (07/22/2018)   TMJ (dislocation of temporomandibular joint)     Tobacco Use: Social History   Tobacco Use  Smoking Status Never  Smokeless Tobacco Never  Tobacco Comments   07/22/2018 "smoked some in college; 1960s;  nothing since"    Labs: Recent Review Flowsheet Data     Labs for ITP Cardiac and Pulmonary Rehab Latest Ref Rng & Units 03/24/2020 04/03/2020 04/06/2020 04/11/2020 11/09/2020   Cholestrol 0 - 200 mg/dL - - - - 234(H)  LDLCALC 0 - 99 mg/dL - - - - 159(H)   LDLDIRECT mg/dL - - - - -   HDL >39.00 mg/dL - - - - 52.80   Trlycerides 0.0 - 149.0 mg/dL - - - - 112.0   Hemoglobin A1c 4.8 - 5.6 % 6.1 6.0(H) - - -   PHART 7.350 - 7.450 - 7.374 7.349(L) 7.470(H) -   PCO2ART 32.0 - 48.0 mmHg - 34.9 48.2(H) 38.4 -   HCO3 20.0 - 28.0 mmol/L - 20.4 26.3 27.9 -   TCO2 22 - 32 mmol/L - 21(L) 28 29 -   ACIDBASEDEF 0.0 - 2.0 mmol/L - 4.0(H) - - -   O2SAT % - 100.0 96.0 98.0 -        Pulmonary Assessment Scores:  Pulmonary Assessment Scores     Row Name 07/06/21 1036 10/19/21 1057 10/19/21 1419     ADL UCSD   ADL Phase Entry Exit --   SOB Score total 62 37 --   Rest 0 0 --   Walk 1 0 --   Stairs 4 5 --   Bath 1 1 --   Dress 1 0 --   Shop 4 3 --     CAT Score   CAT  Score 9 9 --     mMRC Score   mMRC Score 1 -- 0            UCSD: Self-administered rating of dyspnea associated with activities of daily living (ADLs) 6-point scale (0 = "not at all" to 5 = "maximal or unable to do because of breathlessness")  Scoring Scores range from 0 to 120.  Minimally important difference is 5 units  CAT: CAT can identify the health impairment of COPD patients and is better correlated with disease progression.  CAT has a scoring range of zero to 40. The CAT score is classified into four groups of low (less than 10), medium (10 - 20), high (21-30) and very high (31-40) based on the impact level of disease on health status. A CAT score over 10 suggests significant symptoms.  A worsening CAT score could be explained by an exacerbation, poor medication adherence, poor inhaler technique, or progression of COPD or comorbid conditions.  CAT MCID is 2 points  mMRC: mMRC (Modified Medical Research Council) Dyspnea Scale is used to assess the degree of baseline functional disability in patients of respiratory disease due to dyspnea. No minimal important difference is established. A decrease in score of 1 point or greater is considered a positive change.   Pulmonary Function Assessment:   Exercise Target Goals: Exercise Program Goal: Individual exercise prescription set using results from initial 6 min walk test and THRR while considering  patients activity barriers and safety.   Exercise Prescription Goal: Initial exercise prescription builds to 30-45 minutes a day of aerobic activity, 2-3 days per week.  Home exercise guidelines will be given to patient during program as part of exercise prescription that the participant will acknowledge.  Education: Aerobic Exercise: - Group verbal and visual presentation on the components of exercise prescription. Introduces F.I.T.T principle from ACSM for exercise prescriptions.  Reviews F.I.T.T. principles of aerobic exercise  including progression. Written material given at graduation. Flowsheet Row Pulmonary Rehab from 10/26/2021 in [facility] Cardiac and Pulmonary Rehab  Date 07/28/21  Educator [facility]  Instruction Review Code 1- Verbalizes Understanding       Education: Resistance Exercise: - Group verbal and visual presentation on the components of exercise prescription. Introduces F.I.T.T principle from  ACSM for exercise prescriptions  Reviews F.I.T.T. principles of resistance exercise including progression. Written material given at graduation. Flowsheet Row Pulmonary Rehab from 10/26/2021 in [facility] Cardiac and Pulmonary Rehab  Date 10/05/21  Educator [facility]  Instruction Review Code 1- Verbalizes Understanding        Education: Exercise & Equipment Safety: - Individual verbal instruction and demonstration of equipment use and safety with use of the equipment. Flowsheet Row Pulmonary Rehab from 10/26/2021 in [facility] Cardiac and Pulmonary Rehab  Education need identified 07/06/21  Date 07/06/21  Educator [place]  Instruction Review Code 1- Verbalizes Understanding       Education: Exercise Physiology & General Exercise Guidelines: - Group verbal and written instruction with models to review the exercise physiology of the cardiovascular system and associated critical values. Provides general exercise guidelines with specific guidelines to those with heart or lung disease.  Flowsheet Row Pulmonary Rehab from 10/26/2021 in [facility] Cardiac and Pulmonary Rehab  Date 07/21/21  Educator [facility]  Instruction Review Code 1- Verbalizes Understanding       Education: Flexibility, Balance, Mind/Body Relaxation: - Group verbal and visual presentation with interactive activity on the components of exercise prescription. Introduces F.I.T.T principle from ACSM for exercise prescriptions. Reviews F.I.T.T. principles of flexibility and balance exercise training including progression. Also discusses the mind body connection.  Reviews various  relaxation techniques to help reduce and manage stress (i.e. Deep breathing, progressive muscle relaxation, and visualization). Balance handout provided to take home. Written material given at graduation. Flowsheet Row Pulmonary Rehab from 10/26/2021 in [facility] Cardiac and Pulmonary Rehab  Date 10/12/21  Educator [facility]  Instruction Review Code 1- Verbalizes Understanding       Activity Barriers & Risk Stratification:  Activity Barriers & Cardiac Risk Stratification - 07/06/21 1039       Activity Barriers & Cardiac Risk Stratification   Activity Barriers Joint Problems;Back Problems;Other (comment);Deconditioning;Muscular Weakness;Shortness of Breath    Comments Guillian Barre, targets feet and legs bilaterally             6 Minute Walk:  6 Minute Walk     Row Name 07/06/21 1041 10/19/21 1227       6 Minute Walk   Phase Initial Discharge    Distance 930 feet 1445 feet    Distance % Change -- 55 %    Distance Feet Change -- 515 ft    Walk Time 6 minutes 6 minutes    # of Rest Breaks 0 0    MPH 1.76 2.74    METS 1.67 2.87    RPE 13 13    Perceived Dyspnea  2 3    VO2 Peak 5.86 10.04    Symptoms Yes (comment) Yes (comment)    Comments SOB, fatigued legs SOB    Resting HR 65 bpm 68 bpm    Resting BP 136/76 106/68    Resting Oxygen Saturation  95 % 95 %    Exercise Oxygen Saturation  during 6 min walk 94 % 94 %    Max Ex. HR 99 bpm 109 bpm    Max Ex. BP 144/72 168/76    2 Minute Post BP 134/74 126/74      Interval HR   1 Minute HR 89 109    2 Minute HR 96 68    3 Minute HR 99 69    4 Minute HR 95 80    5 Minute HR 98 78    6 Minute HR 99 68    2  Minute Post HR 61 94    Interval Heart Rate? Yes Yes      Interval Oxygen   Interval Oxygen? Yes Yes    Baseline Oxygen Saturation % 95 % 95 %    1 Minute Oxygen Saturation % 96 % 96 %    1 Minute Liters of Oxygen 0 L  RA 0 L    2 Minute Oxygen Saturation % 94 % 96 %    2 Minute Liters of Oxygen 0 L 0 L    3 Minute  Oxygen Saturation % 95 % 96 %    3 Minute Liters of Oxygen 0 L 0 L    4 Minute Oxygen Saturation % 95 % 94 %    4 Minute Liters of Oxygen 0 L 0 L    5 Minute Oxygen Saturation % 95 % 97 %    5 Minute Liters of Oxygen 0 L 0 L    6 Minute Oxygen Saturation % 95 % 96 %    6 Minute Liters of Oxygen 0 L 0 L    2 Minute Post Oxygen Saturation % 96 % 96 %    2 Minute Post Liters of Oxygen 0 L 0 L            Oxygen Initial Assessment:  Oxygen Initial Assessment - 07/06/21 1035       Home Oxygen   Home Oxygen Device None    Sleep Oxygen Prescription CPAP    Home Exercise Oxygen Prescription None    Home Resting Oxygen Prescription None    Compliance with Home Oxygen Use Yes      Initial 6 min Walk   Oxygen Used None      Program Oxygen Prescription   Program Oxygen Prescription None      Intervention   Short Term Goals To learn and understand importance of monitoring SPO2 with pulse oximeter and demonstrate accurate use of the pulse oximeter.;To learn and understand importance of maintaining oxygen saturations>88%;To learn and demonstrate proper pursed lip breathing techniques or other breathing techniques.     Long  Term Goals Verbalizes importance of monitoring SPO2 with pulse oximeter and return demonstration;Maintenance of O2 saturations>88%;Exhibits proper breathing techniques, such as pursed lip breathing or other method taught during program session             Oxygen Re-Evaluation:  Oxygen Re-Evaluation     Row Name 07/12/21 1025 07/26/21 0929 08/11/21 0819 09/05/21 0929 10/07/21 0931     Program Oxygen Prescription   Program Oxygen Prescription _0      Home Oxygen   Home Oxygen Device _1    Sleep Oxygen Prescription _2    Home Exercise Oxygen Prescription _3    Home Resting Oxygen Prescription _4    Compliance with Home Oxygen Use _5       Goals/Expected Outcomes   Short Term Goals To learn and understand importance of monitoring SPO2 with pulse oximeter and demonstrate accurate use of the pulse oximeter.;To learn and understand importance of maintaining oxygen saturations>88%;To learn and demonstrate proper pursed lip breathing techniques or other breathing techniques.  To learn and understand importance of monitoring SPO2 with pulse oximeter and demonstrate accurate use of the pulse oximeter.;To learn and understand importance of maintaining oxygen saturations>88%;To learn and demonstrate proper pursed lip breathing techniques or other breathing techniques.  To learn and understand importance of  monitoring SPO2 with pulse oximeter and demonstrate accurate use of the pulse oximeter.;To learn and understand importance of maintaining oxygen saturations>88%;To learn and demonstrate proper pursed lip breathing techniques or other breathing techniques.  To learn and understand importance of monitoring SPO2 with pulse oximeter and demonstrate accurate use of the pulse oximeter.;To learn and understand importance of maintaining oxygen saturations>88%;To learn and demonstrate proper pursed lip breathing techniques or other breathing techniques.  To learn and understand importance of monitoring SPO2 with pulse oximeter and demonstrate accurate use of the pulse oximeter.;To learn and understand importance of maintaining oxygen saturations>88%;To learn and demonstrate proper pursed lip breathing techniques or other breathing techniques.    Long  Term Goals Verbalizes importance of monitoring SPO2 with pulse oximeter and return demonstration;Maintenance of O2 saturations>88%;Exhibits proper breathing techniques, such as pursed lip breathing or other method taught during program session Verbalizes importance of monitoring SPO2 with pulse oximeter and return demonstration;Maintenance of O2 saturations>88%;Exhibits proper breathing techniques, such as pursed lip  breathing or other method taught during program session Verbalizes importance of monitoring SPO2 with pulse oximeter and return demonstration;Maintenance of O2 saturations>88%;Exhibits proper breathing techniques, such as pursed lip breathing or other method taught during program session Verbalizes importance of monitoring SPO2 with pulse oximeter and return demonstration;Maintenance of O2 saturations>88%;Exhibits proper breathing techniques, such as pursed lip breathing or other method taught during program session Verbalizes importance of monitoring SPO2 with pulse oximeter and return demonstration;Maintenance of O2 saturations>88%;Exhibits proper breathing techniques, such as pursed lip breathing or other method taught during program session   Comments Reviewed PLB technique with pt.  Talked about how it works and it's importance in maintaining their exercise saturations. She reports no problems with the CPAP. She does not usually use PLB, but she has read about it before, reviewed how to do it. SHe has a pulse ox at home and reports it stays above 90 and has not been low. She is doing well with her CPAP.  She has not felt that she has needed her PLB.  She was encouraged to continue to practice using it to help when she does need it.  Usually, when she feels short of breath, she will slow down to catch her breath. She is doing well with her CPAP, she reports no issues right now (the tracker says she is only having two events at night now). She is practicing PLB and does feel like it helps when she is short of breath.  Usually, when she feels short of breath, she will slow down to catch her breath. Fraser Din is doing in rehab.  She is compliant with her CPAP each night.  She still gets short of breath while working and talking.  Sats are doing well.  She is good about using her PLB  and finds it helpful when trying to control her breathing.   Goals/Expected Outcomes Short: Become more profiecient at using PLB.   Long:  Become independent at using PLB. ST: practice PLB LT: become independent using PLB ST: practice PLB LT: become independent using PLB ST: practice PLB LT: become independent using PLB Short: continue to use PLB Long: Conitnued compliance with CPAP.    Gordon Name 10/31/21 0947             Program Oxygen Prescription   Program Oxygen Prescription None         Home Oxygen   Home Oxygen Device None       Sleep Oxygen Prescription CPAP  Home Exercise Oxygen Prescription None       Home Resting Oxygen Prescription None       Compliance with Home Oxygen Use Yes         Goals/Expected Outcomes   Short Term Goals To learn and understand importance of monitoring SPO2 with pulse oximeter and demonstrate accurate use of the pulse oximeter.;To learn and understand importance of maintaining oxygen saturations>88%;To learn and demonstrate proper pursed lip breathing techniques or other breathing techniques.        Long  Term Goals Verbalizes importance of monitoring SPO2 with pulse oximeter and return demonstration;Maintenance of O2 saturations>88%;Exhibits proper breathing techniques, such as pursed lip breathing or other method taught during program session       Comments Fraser Din continues to use her CPAP and sleeps well most of the time.  She does check her oxygen and it stays 95-100%.       Goals/Expected Outcomes ST/LT: continue to use CPAP and monitor oxygen                Oxygen Discharge (Final Oxygen Re-Evaluation):  Oxygen Re-Evaluation - 10/31/21 0947       Program Oxygen Prescription   Program Oxygen Prescription None      Home Oxygen   Home Oxygen Device None    Sleep Oxygen Prescription CPAP    Home Exercise Oxygen Prescription None    Home Resting Oxygen Prescription None    Compliance with Home Oxygen Use Yes      Goals/Expected Outcomes   Short Term Goals To learn and understand importance of monitoring SPO2 with pulse oximeter and demonstrate accurate use of the pulse  oximeter.;To learn and understand importance of maintaining oxygen saturations>88%;To learn and demonstrate proper pursed lip breathing techniques or other breathing techniques.     Long  Term Goals Verbalizes importance of monitoring SPO2 with pulse oximeter and return demonstration;Maintenance of O2 saturations>88%;Exhibits proper breathing techniques, such as pursed lip breathing or other method taught during program session    Comments Fraser Din continues to use her CPAP and sleeps well most of the time.  She does check her oxygen and it stays 95-100%.    Goals/Expected Outcomes ST/LT: continue to use CPAP and monitor oxygen             Initial Exercise Prescription:  Initial Exercise Prescription - 07/06/21 1000       Date of Initial Exercise RX and Referring Provider   Date 07/06/21    Referring Provider Ida Rogue MD      Oxygen   Maintain Oxygen Saturation 88% or higher      Treadmill   MPH 1.6    Grade 0    Minutes 15    METs 2.23      Recumbant Bike   Level 1    RPM 60    Watts 15    Minutes 15    METs 1.6      NuStep   Level 1    SPM 80    Minutes 15    METs 1.6      Prescription Details   Frequency (times per week) 2    Duration Progress to 30 minutes of continuous aerobic without signs/symptoms of physical distress      Intensity   THRR 40-80% of Max Heartrate 94-124    Ratings of Perceived Exertion 11-13    Perceived Dyspnea 0-4      Progression   Progression Continue to progress workloads to maintain intensity  without signs/symptoms of physical distress.      Resistance Training   Training Prescription Yes    Weight 3 lb    Reps 10-15             Perform Capillary Blood Glucose checks as needed.  Exercise Prescription Changes:   Exercise Prescription Changes     Row Name 07/06/21 1000 07/25/21 1500 08/03/21 1100 08/09/21 1400 08/23/21 1400     Response to Exercise   Blood Pressure (Admit) 136/76 128/68 -- 120/62 118/62   Blood  Pressure (Exercise) 144/72 130/80 -- 124/64 --   Blood Pressure (Exit) 134/74 124/68 -- 120/66 124/62   Heart Rate (Admit) 65 bpm 79 bpm -- 70 bpm 78 bpm   Heart Rate (Exercise) 99 bpm 102 bpm -- 96 bpm 87 bpm   Heart Rate (Exit) 61 bpm 70 bpm -- 72 bpm 81 bpm   Oxygen Saturation (Admit) 95 % 96 % -- 95 % 97 %   Oxygen Saturation (Exercise) 94 % 92 % -- 94 % 95 %   Oxygen Saturation (Exit) 96 % 96 % -- 96 % 95 %   Rating of Perceived Exertion (Exercise) 13 15 -- 13 12   Perceived Dyspnea (Exercise) 2 1 -- 2 1   Symptoms SOB, fatigued legs -- -- SOB --   Comments walk test results fourth session -- -- --   Duration -- Continue with 30 min of aerobic exercise without signs/symptoms of physical distress. -- Continue with 30 min of aerobic exercise without signs/symptoms of physical distress. Continue with 30 min of aerobic exercise without signs/symptoms of physical distress.   Intensity -- THRR unchanged -- THRR unchanged THRR unchanged     Progression   Progression -- Continue to progress workloads to maintain intensity without signs/symptoms of physical distress. -- Continue to progress workloads to maintain intensity without signs/symptoms of physical distress. Continue to progress workloads to maintain intensity without signs/symptoms of physical distress.   Average METs -- 2 -- 2.14 2     Resistance Training   Training Prescription -- Yes -- Yes Yes   Weight -- 3 lb -- 3 lb 3 lb   Reps -- 10-15 -- 10-15 10-15     Interval Training   Interval Training -- -- -- No No     Treadmill   MPH -- -- -- 1.6 --   Grade -- -- -- 0 --   Minutes -- -- -- 15 --   METs -- -- -- 2.23 --     NuStep   Level -- -- -- 3 --   Minutes -- -- -- 15 --     Biostep-RELP   Level -- 1 -- 1 1   Minutes -- 15 -- 15 15   METs -- 2 -- 2 2     Track   Laps -- 20 -- 30 26   Minutes -- 15 -- 15 15   METs -- 2.09 -- 2.63 2.41     Home Exercise Plan   Plans to continue exercise at -- -- Home (comment)   walking, community gym Home (comment)  walking, community gym Home (comment)  walking, community gym   Frequency -- -- Add 2 additional days to program exercise sessions. Add 2 additional days to program exercise sessions. Add 2 additional days to program exercise sessions.   Initial Home Exercises Provided -- -- 08/03/21 08/03/21 08/03/21     Oxygen   Maintain Oxygen Saturation -- -- 88% or higher 88%  or higher 88% or higher    Row Name 09/05/21 1100 09/20/21 0700 10/04/21 1600 10/17/21 1700       Response to Exercise   Blood Pressure (Admit) 120/68 122/60 104/60 104/60    Blood Pressure (Exit) 130/72 110/62 106/60 104/58    Heart Rate (Admit) 69 bpm 74 bpm 71 bpm 70 bpm    Heart Rate (Exercise) 99 bpm 84 bpm 109 bpm 112 bpm    Heart Rate (Exit) 78 bpm 72 bpm 83 bpm 70 bpm    Oxygen Saturation (Admit) 95 % 94 % 94 % 94 %    Oxygen Saturation (Exercise) 94 % 92 % 95 % 92 %    Oxygen Saturation (Exit) 96 % 94 % 95 % 94 %    Rating of Perceived Exertion (Exercise) _0 Perceived Dyspnea (Exercise) _1 Symptoms none none none none    Duration Continue with 30 min of aerobic exercise without signs/symptoms of physical distress. Continue with 30 min of aerobic exercise without signs/symptoms of physical distress. Continue with 30 min of aerobic exercise without signs/symptoms of physical distress. Continue with 30 min of aerobic exercise without signs/symptoms of physical distress.    Intensity THRR unchanged THRR unchanged THRR unchanged THRR unchanged      Progression   Progression Continue to progress workloads to maintain intensity without signs/symptoms of physical distress. Continue to progress workloads to maintain intensity without signs/symptoms of physical distress. Continue to progress workloads to maintain intensity without signs/symptoms of physical distress. Continue to progress workloads to maintain intensity without signs/symptoms of physical distress.     Average METs 2.18 2.7 2.8 3.2      Resistance Training   Training Prescription Yes Yes Yes Yes    Weight 3 lb 3 lb 3 lb 3 lb    Reps 10-15 10-15 10-15 10-15      Interval Training   Interval Training No No No No      Treadmill   MPH -- 2.2 2.5 --    Grade -- -- 0.5 --    Minutes -- 15 15 --    METs -- 2.69 3.09 --      NuStep   Level -- _2 Minutes -- _3 METs -- 2.7 2.4 3.4      Arm Ergometer   Level 1.3 -- -- --    Minutes 15 -- -- --    METs 1.4 -- -- --      T5 Nustep   Level 1 -- -- --    Minutes 15 -- -- --    METs 1.9 -- -- --      Biostep-RELP   Level 1 -- -- --    Minutes 15 -- -- --    METs 2 -- -- --      Track   Laps 35 -- 35 37    Minutes 15 -- 15 15    METs 2.9 -- 2.9 3      Home Exercise Plan   Plans to continue exercise at Home (comment)  walking, community gym Home (comment)  walking, community gym Home (comment)  walking, community gym Home (comment)  walking, community gym    Frequency Add 2 additional days to program exercise sessions. Add 2 additional days to program exercise sessions. Add 2 additional days to program exercise sessions. Add 2 additional days to program exercise sessions.  Initial Home Exercises Provided 08/03/21 08/03/21 08/03/21 08/03/21      Oxygen   Maintain Oxygen Saturation 88% or higher 88% or higher 88% or higher 88% or higher             Exercise Comments:   Exercise Comments     Row Name 07/12/21 5726 11/02/21 0921         Exercise Comments First full day of exercise!  Patient was oriented to gym and equipment including functions, settings, policies, and procedures.  Patient's individual exercise prescription and treatment plan were reviewed.  All starting workloads were established based on the results of the 6 minute walk test done at initial orientation visit.  The plan for exercise progression was also introduced and progression will be customized based on patient's performance and goals.  Aveleen graduated today from  rehab with 36 sessions completed.  Details of the patient's exercise prescription and what She needs to do in order to continue the prescription and progress were discussed with patient.  Patient was given a copy of prescription and goals.  Patient verbalized understanding.  Roizy plans to continue to exercise by walking at home and going to the [employer].               Exercise Goals and Review:   Exercise Goals     Row Name 07/06/21 1047             Exercise Goals   Increase Physical Activity Yes       Intervention Provide advice, education, support and counseling about physical activity/exercise needs.;Develop an individualized exercise prescription for aerobic and resistive training based on initial evaluation findings, risk stratification, comorbidities and participant's personal goals.       Expected Outcomes Short Term: Attend rehab on a regular basis to increase amount of physical activity.;Long Term: Add in home exercise to make exercise part of routine and to increase amount of physical activity.;Long Term: Exercising regularly at least 3-5 days a week.       Increase Strength and Stamina Yes       Intervention Provide advice, education, support and counseling about physical activity/exercise needs.;Develop an individualized exercise prescription for aerobic and resistive training based on initial evaluation findings, risk stratification, comorbidities and participant's personal goals.       Expected Outcomes Short Term: Increase workloads from initial exercise prescription for resistance, speed, and METs.;Short Term: Perform resistance training exercises routinely during rehab and add in resistance training at home;Long Term: Improve cardiorespiratory fitness, muscular endurance and strength as measured by increased METs and functional capacity (6MWT)       Able to understand and use rate of perceived exertion (RPE) scale Yes        Intervention Provide education and explanation on how to use RPE scale       Expected Outcomes Short Term: Able to use RPE daily in rehab to express subjective intensity level;Long Term:  Able to use RPE to guide intensity level when exercising independently       Able to understand and use Dyspnea scale Yes       Intervention Provide education and explanation on how to use Dyspnea scale       Expected Outcomes Short Term: Able to use Dyspnea scale daily in rehab to express subjective sense of shortness of breath during exertion;Long Term: Able to use Dyspnea scale to guide intensity level when exercising independently       Knowledge and understanding of Target Heart  Rate Range (THRR) Yes       Intervention Provide education and explanation of THRR including how the numbers were predicted and where they are located for reference       Expected Outcomes Short Term: Able to state/look up THRR;Long Term: Able to use THRR to govern intensity when exercising independently;Short Term: Able to use daily as guideline for intensity in rehab       Able to check pulse independently Yes       Intervention Provide education and demonstration on how to check pulse in carotid and radial arteries.;Review the importance of being able to check your own pulse for safety during independent exercise       Expected Outcomes Short Term: Able to explain why pulse checking is important during independent exercise;Long Term: Able to check pulse independently and accurately       Understanding of Exercise Prescription Yes       Intervention Provide education, explanation, and written materials on patient's individual exercise prescription       Expected Outcomes Short Term: Able to explain program exercise prescription;Long Term: Able to explain home exercise prescription to exercise independently                Exercise Goals Re-Evaluation :  Exercise Goals Re-Evaluation     Row Name 07/12/21 1093 07/25/21 1535  07/26/21 0931 08/03/21 1142 08/09/21 1446     Exercise Goal Re-Evaluation   Exercise Goals Review Increase Physical Activity;Able to understand and use rate of perceived exertion (RPE) scale;Knowledge and understanding of Target Heart Rate Range (THRR);Understanding of Exercise Prescription;Increase Strength and Stamina;Able to check pulse independently;Able to understand and use Dyspnea scale Increase Physical Activity;Increase Strength and Stamina Increase Physical Activity;Increase Strength and Stamina Increase Physical Activity;Increase Strength and Stamina;Able to understand and use rate of perceived exertion (RPE) scale;Able to understand and use Dyspnea scale;Knowledge and understanding of Target Heart Rate Range (THRR);Able to check pulse independently;Understanding of Exercise Prescription Increase Physical Activity;Increase Strength and Stamina;Understanding of Exercise Prescription   Comments Reviewed RPE and dyspnea scales, THR and program prescription with pt today.  Pt voiced understanding and was given a copy of goals to take home. Fraser Din has tolerated exercise well in her first sessions.  Oxygen stays in the 90s.She has reached 20 laps on the track. She will walk at home, not structured, but she would like to change that - sometimes she does such as saturday she went for a long walk. EP has not gone over home exercise yet. Reviewed home exercise with pt today.  Pt plans to walk and use gym in community for exercise.  The gym has a treadmill, elliptical, weights, and a NuStep.  Reviewed THR, pulse, RPE, sign and symptoms, pulse oximetery and when to call 911 or MD.  Also discussed weather considerations and indoor options.  Pt voiced understanding. Fraser Din is doing well in rehab.  She is up to 30 laps on the track.  We will continue to monitor her progress.   Expected Outcomes Short: Use RPE daily to regulate intensity. Long: Follow program prescription in THR. Short:  attend consistently Long:  improve  overall stamina ST: EP to go over home exercise LT: exercise outside of rehab Short: Start to use gym in complex at least one extra day a week Long: Exercise independently on off days Short: Increase BioStep Long: Continue to improve stamina.    [place] Name 08/11/21 [phone] 08/23/21 1441 09/05/21 0931 09/05/21 1148 09/09/21 7322  Exercise Goal Re-Evaluation   Exercise Goals Review Increase Physical Activity;Increase Strength and Stamina;Understanding of Exercise Prescription Increase Physical Activity;Increase Strength and Stamina Increase Physical Activity;Increase Strength and Stamina Increase Physical Activity;Increase Strength and Stamina Increase Physical Activity;Increase Strength and Stamina;Understanding of Exercise Prescription   Comments Fraser Din is doing well in rehab. She is doing at home too.  She is going to gym and using NuStep and weight machines.  She went once last week and not this week.  We will continue to encourage her to exercise on her off days from rehab. Fraser Din continues to do well and walks between 26 and 32 laps on the track.  Oxygen stays in the mid 90s on room air. We will continue to monitor. Fraser Din is mostly walking around the neighborhood right now (1/2 mile: 17mnutes RPE 12, Dyspnea 2:  2x/week -weather permitting) PFraser Dinis doing well in rehab. She reached a max of 35 laps around the track! It would be beneficial to have her keep that consistent each time. She incorporated the arm ergometer into her exercise prescription and tolerating it well.  Her oxygen sats are staying above 88% during exercise. Will continue to monitor. PFraser Dinis doing well in rehab.  She is feeling better overall.  She will walk outside on her off days when the weather is nice. She also does her weights at home.  She is recovering her strength and stamina.   Expected Outcomes Short: Continue to add in exercise on off days  Long: Continue to exercise independently Short:  maintain consistent exercise Long:  build overall  stamina Short:  maintain consistent exercise, at least 30 minutes sessions outside of rehab Long:  build overall stamina Short: Continue to build up laps on track Long: Continue to increase overall MET level Short: Continue to exercise on off days Long: COnitnue to improve stamina.    [place] 09/20/21 0741 10/04/21 1601 10/07/21 0923 10/17/21 1740 10/31/21 0927     Exercise Goal Re-Evaluation   Exercise Goals Review Increase Physical Activity;Increase Strength and Stamina Increase Physical Activity;Increase Strength and Stamina;Understanding of Exercise Prescription Increase Physical Activity;Increase Strength and Stamina;Understanding of Exercise Prescription Increase Physical Activity;Increase Strength and Stamina Increase Physical Activity;Increase Strength and Stamina   Comments PFraser Dinattends consistently and has improved average MET level.  Staff will encourage trying 4 lb for strength work. PFraser Dinis doing well in rehab. She is up to level 4 on the NuStep.  We will continue to encourage her to try 4lb again and continue to monitor her progress. PFraser Dinis doing well in rehab.  She is getting in some exercise at home.  She is walking every other day inside for about 20 min and doing her handweights.  She has not been to gym recently.  She would lke to get back gym. She has been more social recently playing cards. PFraser Dinis progressing well and is up to level 4 on T4 and reached 37 laps on the track!  She still uses 3 lb for strength - can move to 4 lb.  Staff will continue to monitor. [facility] [place] [facility]   Expected Outcomes Short: try 4 lb Long:  continue to improve stamina Short: try 4 lb Long:  continue to improve stamina Short: Get back to gym again Long: Continue to improve stamina. Short: try 4 lb Long: continue to build stamina ST/LT:complete LW and maintain fitness on her own  Discharge Exercise Prescription (Final Exercise Prescription  Changes):  Exercise Prescription Changes - 10/17/21 1700       Response to Exercise   Blood Pressure (Admit) 104/60    Blood Pressure (Exit) 104/58    Heart Rate (Admit) 70 bpm    Heart Rate (Exercise) 112 bpm    Heart Rate (Exit) 70 bpm    Oxygen Saturation (Admit) 94 %    Oxygen Saturation (Exercise) 92 %    Oxygen Saturation (Exit) 94 %    Rating of Perceived Exertion (Exercise) 13    Perceived Dyspnea (Exercise) 2    Symptoms none    Duration Continue with 30 min of aerobic exercise without signs/symptoms of physical distress.    Intensity THRR unchanged      Progression   Progression Continue to progress workloads to maintain intensity without signs/symptoms of physical distress.    Average METs 3.2      Resistance Training   Training Prescription Yes    Weight 3 lb    Reps 10-15      Interval Training   Interval Training No      NuStep   Level 4    Minutes 15    METs 3.4      Track   Laps 37    Minutes 15    METs 3      Home Exercise Plan   Plans to continue exercise at Home (comment)   walking, community gym   Frequency Add 2 additional days to program exercise sessions.    Initial Home Exercises Provided 08/03/21      Oxygen   Maintain Oxygen Saturation 88% or higher             Nutrition:  Target Goals: Understanding of nutrition guidelines, daily intake of sodium <1551m, cholesterol <2042m calories 30% from fat and 7% or less from saturated fats, daily to have 5 or more servings of fruits and vegetables.  Education: All About Nutrition: -Group instruction provided by verbal, written material, interactive activities, discussions, models, and posters to present general guidelines for heart healthy nutrition including fat, fiber, MyPlate, the role of sodium in heart healthy nutrition, utilization of the nutrition label, and utilization of this knowledge for meal planning. Follow up email sent as well. Written material given at graduation. Flowsheet  [facility]  Date 10/26/21  [facility]ator [facility] Review Code 1- Verbalizes Understanding       Biometrics:  Pre Biometrics - 07/06/21 1039       Pre Biometrics   Height 5' 6.5" (1.689 m)    Weight 178 lb 14.4 oz (81.1 kg)    BMI (Calculated) 28.45    Single Leg Stand 3.97 seconds             Post Biometrics - 10/19/21 1229        Post  Biometrics   Height 5' 6.5" (1.689 m)    Weight 179 lb 6.4 oz (81.4 kg)    BMI (Calculated) 28.53    Single Leg Stand 2.8 seconds             Nutrition Therapy Plan and Nutrition Goals:  Nutrition Therapy & Goals - 07/28/21 0926       Nutrition Therapy   Diet Heart healthy, low Na    Protein (specify units) 65g    Fiber 25 grams    Whole Grain Foods 3 servings    Saturated Fats 12  max. grams    Fruits and Vegetables 8 servings/day    Sodium 1.5 grams      Personal Nutrition Goals   Nutrition Goal ST: practice mindful eating and paying attention to hunger cues, try including lunch or satiating snack before dinner to avoid eating way past point of fullness LT: prevent over eating at dinner.    Comments [place] [facility] [employer]. 1 Meal per day- lunch or dinner. she feels she has a problem with overeating. She will make oatmeal or dry cereal (all bran or cheerios with lactaid, but usually skim milk) and sometimes eggs. She may bring something home from dinner or have a sandwich (chciken salad, grilled cheese, tuna salad on whole wheat or white). She may also cook hamburger or salmon which she keeps in the fridge.Drinks: water and unsweet tea with meals. Meal includes entree with three vegetables, soup, and salad as well as dessert and she wants to stop ordering it especially since she does not enjoy it much. She feels these meals taste salty. Discussed heart healthy eatong and hunger/fullness. She would like to try eating before dinner - leftovers from the night before, a  sandwich, crackers with peanut butter.      Intervention Plan   Intervention Prescribe, educate and counsel regarding individualized specific dietary modifications aiming towards targeted core components such as weight, hypertension, lipid management, diabetes, heart failure and other comorbidities.;Nutrition handout(s) given to patient.    Expected Outcomes Short Term Goal: Understand basic principles of dietary content, such as calories, fat, sodium, cholesterol and nutrients.;Short Term Goal: A plan has been developed with personal nutrition goals set during dietitian appointment.;Long Term Goal: Adherence to prescribed nutrition plan.             Nutrition Assessments:  MEDIFICTS Score Key: ?70 Need to make dietary changes  40-70 Heart Healthy Diet ? 40 Therapeutic Level Cholesterol Diet  Flowsheet Row Pulmonary Rehab from 10/19/2021 in [facility] and Pulmonary Rehab  Picture Your Plate Total Score on Admission 61  Picture Your Plate Total Score on Discharge 60      Picture Your Plate Scores: <46 Unhealthy dietary pattern with much room for improvement. 41-50 Dietary pattern unlikely to meet recommendations for good health and room for improvement. 51-60 More healthful dietary pattern, with some room for improvement.  >60 Healthy dietary pattern, although there may be some specific behaviors that could be improved.   Nutrition Goals Re-Evaluation:  Nutrition Goals Re-Evaluation     [place] Name 08/11/21 [phone] 09/05/21 0935 10/07/21 0926 10/31/21 0943       Goals   Nutrition Goal ST: practice mindful eating and paying attention to hunger cues, try including lunch or satiating snack before dinner to avoid eating way past point of fullness LT: prevent over eating at dinner. ST: practice mindful eating and paying attention to hunger cues LT: prevent over eating at dinner. ST: practice mindful eating and paying attention to hunger cues LT: prevent over eating at dinner. --     Comment Fraser Din is doing well in rehab.  She admits to having room to improve.  Her weakness is desserts.  She eats once a day served and it comes with dessert.  SHe has been trying to take it home to snack on versus eating all at once.  She is doing better about eating lunch and trying to focus on her hunger cues at night.  She continues to eat oatmeal or cereal for breakfast. Fraser Din reports doing well with nutrition and has  been sometimes taking leftovers from dinner home. She now has yogurt that she will eat in between her meals. She is also including eggs with her breakfast instead of oatmeal (encouraged her she can include both). Fraser Din is doing well in rehab. She admits to eating too much as her weight is not changing but she does not feel that she is eating a lot.  She is also trying eliminate desserts.  We talked about switching to fruit for dessert.  She is working on her hunger cues more. Fraser Din continues to work on [employer] and having less dessert.  She is eating a couple bites of ice cream after dinner.    Expected Outcome Short: Continue to focus on hunger cues Long: continue to focus on heart healthy eating Short: Continue to focus on hunger cues Long: continue to focus on heart healthy eating Short: Try fruit for dessert. Long: Continue to work on hunger cues. ST/LT;continue to work on being mindful of hunger cues and having less dessert             Nutrition Goals Discharge (Final Nutrition Goals Re-Evaluation):  Nutrition Goals Re-Evaluation - 10/31/21 0943       Goals   Comment Fraser Din continues to work on hunger cues and having less dessert.  She is eating a couple bites of ice cream after dinner.    Expected Outcome ST/LT;continue to work on being mindful of hunger cues and having less dessert             Psychosocial: Target Goals: Acknowledge presence or absence of significant depression and/or stress, maximize coping skills, provide positive support system. Participant is able to  verbalize types and ability to use techniques and skills needed for reducing stress and depression.   Education: Stress, Anxiety, and Depression - Group verbal and visual presentation to define topics covered.  Reviews how body is impacted by stress, anxiety, and depression.  Also discusses healthy ways to reduce stress and to treat/manage anxiety and depression.  Written material given at graduation. Flowsheet Row Pulmonary Rehab from 10/26/2021 in [facility] Cardiac and Pulmonary Rehab  Date 07/14/21  Educator [facility]  Instruction Review Code 1- [employer] Understanding       Education: Sleep Hygiene -Provides group verbal and written instruction about how sleep can affect your health.  Define sleep hygiene, discuss sleep cycles and impact of sleep habits. Review good sleep hygiene tips.    Initial Review & Psychosocial Screening:  Initial Psych Review & Screening - 07/01/21 1302       Initial Review   Current issues with None Identified      Family Dynamics   Good Support System? Yes   2 nieces not local, sister in [place], neighbors     Barriers   Psychosocial barriers to participate in program There are no identifiable barriers or psychosocial needs.;The patient should benefit from training in stress management and relaxation.      Screening Interventions   Interventions Encouraged to exercise;To provide support and resources with identified psychosocial needs;Provide feedback about the scores to participant    Expected Outcomes Short Term goal: Utilizing psychosocial counselor, staff and physician to assist with identification of specific Stressors or current issues interfering with healing process. Setting desired goal for each stressor or current issue identified.;Long Term Goal: Stressors or current issues are controlled or eliminated.;Long Term goal: The participant improves quality of Life and PHQ9 Scores as seen by post scores and/or verbalization of changes;Short Term goal:  Identification and  review with participant of any Quality of Life or Depression concerns found by scoring the questionnaire.             Quality of Life Scores:  Scores of 19 and below usually indicate a poorer quality of life in these areas.  A difference of  2-3 points is a clinically meaningful difference.  A difference of 2-3 points in the total score of the Quality of Life Index has been associated with significant improvement in overall quality of life, self-image, physical symptoms, and general health in studies assessing change in quality of life.  PHQ-9: Recent Review Flowsheet Data     Depression screen [facility] 2/9 10/19/2021 09/01/2021 08/02/2021 07/06/2021 04/25/2021   Decreased Interest 0 0 0 1 0   Down, Depressed, Hopeless 0 0 0 0 0   PHQ - 2 Score 0 0 0 1 0   Altered sleeping 0 - 1 2 -   Tired, decreased energy 1 - 1 2 -   Change in appetite 1 - 1 1 -   Feeling bad or failure about yourself  0 - 0 0 -   Trouble concentrating 1 - 0 1 -   Moving slowly or fidgety/restless 0 - 0 1 -   Suicidal thoughts 0 - 0 0 -   PHQ-9 Score 3 - 3 8 -   Difficult doing work/chores Not difficult at all - Somewhat difficult Somewhat difficult -      Interpretation of Total Score  Total Score Depression Severity:  1-4 = Minimal depression, 5-9 = Mild depression, 10-14 = Moderate depression, 15-19 = Moderately severe depression, 20-27 = Severe depression   Psychosocial Evaluation and Intervention:  Psychosocial Evaluation - 07/01/21 1345       Psychosocial Evaluation & Interventions   Interventions Encouraged to exercise with the program and follow exercise prescription    Comments 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 She was on the ventilator for 9 days and went through many weeks of rehab. She has regained most of her strength and living in independent living at the [facility]. The biggest symptom that hasn't resolved is her shortness  of breath. 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 She has neices out of town that are supportive, her sister in [place], and a good neighborhood support group where she lives. She is hoping this program will help with her stamina and breathing.    Expected Outcomes Short: attend Pulmonary rehab for education and exercise. Long: develop and maintain positive self care habits.    Continue Psychosocial Services  Follow up required by staff             Psychosocial Re-Evaluation:  Psychosocial Re-Evaluation     [place] Name 07/26/21 [phone] 08/11/21 2035 09/05/21 5974 10/07/21 0925 10/31/21 1638     Psychosocial Re-Evaluation   Current issues with Current Stress Concerns None Identified None Identified None Identified None Identified   Comments She reports not having any major stressors aside from her continuous shortness of breath. She likes to bake - things like banana bread to help reduce stress. She has friends and relatives that she relies on for support. She reports sleeping well - 8 hours at least. Fraser Din is doing well in rehab. Fraser Din still does some baking and  she is getting ready to gear up for bake sale next week.  She currently denies any major stressors and she continues to sleep well. She is using her CPAP. Fraser Din reports doing well with no major stressors at this time and is happy with her progress, last year at this time she was using a rollator. She reports sleeping well and having no issues with the CPAP. Fraser Din has been doing well in rehab.  She has gotten back to playing games with her friends again. She is feeling good mentally and sleeping good.  She has not had any problems using her CPAP. Fraser Din reports no stress concerns.  She states she sleeps well most nights.   Expected Outcomes ST: continue with coping tools for stress and  attending rehab LT: maintain positive attitude short; Continue to exericse for mental boost Long: Conitnue to stay positive short; Continue to exericse for mental boost Long: Conitnue to stay positive Short: Continue to enjoy time with friends Long: continue to focus on positivie ST/LT:maintain positive outlook   Interventions Encouraged to attend Pulmonary Rehabilitation for the exercise Encouraged to attend Pulmonary Rehabilitation for the exercise Encouraged to attend Pulmonary Rehabilitation for the exercise Encouraged to attend Pulmonary Rehabilitation for the exercise --   Continue Psychosocial Services  Follow up required by staff -- Follow up required by staff Follow up required by staff --     Initial Review   Source of Stress Concerns Chronic Illness -- -- -- --            Psychosocial Discharge (Final Psychosocial Re-Evaluation):  Psychosocial Re-Evaluation - 10/31/21 0981       Psychosocial Re-Evaluation   Current issues with None Identified    Comments Fraser Din reports no stress concerns.  She states she sleeps well most nights.    Expected Outcomes ST/LT:maintain positive outlook             Education: Education Goals: Education classes will be provided on a weekly basis, covering required topics. Participant will state understanding/return demonstration of topics presented.  Learning Barriers/Preferences:  Learning Barriers/Preferences - 07/01/21 1307       Learning Barriers/Preferences   Learning Barriers None    Learning Preferences None             General Pulmonary Education Topics:  Infection Prevention: - Provides verbal and written material to individual with discussion of infection control including proper hand washing and proper equipment cleaning during exercise session. Flowsheet Row Pulmonary Rehab from 10/26/2021 in [facility] Cardiac and Pulmonary Rehab  Education need identified 07/06/21  Date 07/06/21  Educator [place]  Instruction Review Code 1-  Verbalizes Understanding       Falls Prevention: - Provides verbal and written material to individual with discussion of falls prevention and safety. Flowsheet Row Pulmonary Rehab from 10/26/2021 in [facility] Cardiac and Pulmonary Rehab  Education need identified 07/06/21  Date 07/06/21  Educator [place]  Instruction Review Code 1- Verbalizes Understanding       Chronic Lung Disease Review: - Group verbal instruction with posters, models, PowerPoint presentations and videos,  to review new updates, new respiratory medications, new advancements in procedures and treatments. Providing information on websites and "800" numbers for continued self-education. Includes information about supplement oxygen, available portable oxygen systems, continuous and intermittent flow rates, oxygen safety, concentrators, and Medicare reimbursement for oxygen. Explanation of Pulmonary Drugs, including class, frequency, complications, importance of spacers, rinsing mouth after steroid MDI's, and proper cleaning methods for nebulizers. Review of basic lung anatomy  and physiology related to function, structure, and complications of lung disease. Review of risk factors. Discussion about methods for diagnosing sleep apnea and types of masks and machines for OSA. Includes a review of the use of types of environmental controls: home humidity, furnaces, filters, dust mite/pet prevention, HEPA vacuums. Discussion about weather changes, air quality and the benefits of nasal washing. Instruction on Warning signs, infection symptoms, calling MD promptly, preventive modes, and value of vaccinations. Review of effective airway clearance, coughing and/or vibration techniques. Emphasizing that all should Create an Action Plan. Written material given at graduation. Flowsheet Row Pulmonary Rehab from 10/26/2021 in [facility] Cardiac and Pulmonary Rehab  Education need identified 07/06/21  Date 09/07/21  Educator [facility]  Instruction Review Code 1-  Verbalizes Understanding       AED/CPR: - Group verbal and written instruction with the use of models to demonstrate the basic use of the AED with the basic ABC's of resuscitation.    Anatomy and Cardiac Procedures: - Group verbal and visual presentation and models provide information about basic cardiac anatomy and function. Reviews the testing methods done to diagnose heart disease and the outcomes of the test results. Describes the treatment choices: Medical Management, Angioplasty, or Coronary Bypass Surgery for treating various heart conditions including Myocardial Infarction, Angina, Valve Disease, and Cardiac Arrhythmias.  Written material given at graduation. Flowsheet Row Pulmonary Rehab from 10/26/2021 in [facility] Cardiac and Pulmonary Rehab  Date 10/05/21  Educator [place]  Instruction Review Code 1- Verbalizes Understanding       Medication Safety: - Group verbal and visual instruction to review commonly prescribed medications for heart and lung disease. Reviews the medication, class of the drug, and side effects. Includes the steps to properly store meds and maintain the prescription regimen.  Written material given at graduation. Flowsheet Row Pulmonary Rehab from 10/26/2021 in [facility] Cardiac and Pulmonary Rehab  Date 08/23/21  Educator [place]  Instruction Review Code 1- Verbalizes Understanding       Other: -Provides group and verbal instruction on various topics (see comments)   Knowledge Questionnaire Score:  Knowledge Questionnaire Score - 10/19/21 1059       Knowledge Questionnaire Score   Pre Score 15/18: O2 sat, Albuterol    Post Score 16/18              Core Components/Risk Factors/Patient Goals at Admission:  Personal Goals and Risk Factors at Admission - 07/06/21 1047       Core Components/Risk Factors/Patient Goals on Admission    Weight Management Yes;Weight Loss    Intervention Weight Management: Provide education and appropriate resources to help  participant work on and attain dietary goals.;Weight Management: Develop a combined nutrition and exercise program designed to reach desired caloric intake, while maintaining appropriate intake of nutrient and fiber, sodium and fats, and appropriate energy expenditure required for the weight goal.;Weight Management/Obesity: Establish reasonable short term and long term weight goals.    Admit Weight 178 lb 14.4 oz (81.1 kg)    Goal Weight: Short Term 173 lb (78.5 kg)    Goal Weight: Long Term 168 lb (76.2 kg)    Expected Outcomes Short Term: Continue to assess and modify interventions until short term weight is achieved;Long Term: Adherence to nutrition and physical activity/exercise program aimed toward attainment of established weight goal;Weight Loss: Understanding of general recommendations for a balanced deficit meal plan, which promotes 1-2 lb weight loss per week and includes a negative energy balance of [phone] kcal/d;Understanding of distribution of calorie intake  throughout the day with the consumption of 4-5 meals/snacks;Understanding recommendations for meals to include 15-35% energy as protein, 25-35% energy from fat, 35-60% energy from carbohydrates, less than 271m of dietary cholesterol, 20-35 gm of total fiber daily    Hypertension Yes    Intervention Provide education on lifestyle modifcations including regular physical activity/exercise, weight management, moderate sodium restriction and increased consumption of fresh fruit, vegetables, and low fat dairy, alcohol moderation, and smoking cessation.;Monitor prescription use compliance.    Expected Outcomes Short Term: Continued assessment and intervention until BP is < 140/989mHG in hypertensive participants. < 130/8019mG in hypertensive participants with diabetes, heart failure or chronic kidney disease.;Long Term: Maintenance of blood pressure at goal levels.    Lipids Yes    Intervention Provide education and support for participant on  nutrition & aerobic/resistive exercise along with prescribed medications to achieve LDL <39m65mDL >40mg62m Expected Outcomes Short Term: Participant states understanding of desired cholesterol values and is compliant with medications prescribed. Participant is following exercise prescription and nutrition guidelines.;Long Term: Cholesterol controlled with medications as prescribed, with individualized exercise RX and with personalized nutrition plan. Value goals: LDL < 39mg,85m > 40 mg.             Education:Diabetes - Individual verbal and written instruction to review signs/symptoms of diabetes, desired ranges of glucose level fasting, after meals and with exercise. Acknowledge that pre and post exercise glucose checks will be done for 3 sessions at entry of program.   Know Your Numbers and Heart Failure: - Group verbal and visual instruction to discuss disease risk factors for cardiac and pulmonary disease and treatment options.  Reviews associated critical values for Overweight/Obesity, Hypertension, Cholesterol, and Diabetes.  Discusses basics of heart failure: signs/symptoms and treatments.  Introduces Heart Failure Zone chart for action plan for heart failure.  Written material given at graduation. Flowsheet Row [facility]  Date 09/01/21  Educator SB  Instruction Review Code 1- Verbalizes Understanding       Core Components/Risk Factors/Patient Goals Review:   Goals and Risk Factor Review     Row Name 07/26/21 0933 08/11/21 0817 09/05/21 0933 10/07/21 0928 10/31/21 0923     Core Components/Risk Factors/Patient Goals Review   Personal Goals Review Hypertension;Lipids;Heart Failure;Improve shortness of breath with ADL's Hypertension;Lipids;Heart Failure;Improve shortness of breath with ADL's;Weight Management/Obesity Hypertension;Lipids;Heart Failure;Improve shortness of breath with ADL's;Weight Management/Obesity  Hypertension;Lipids;Heart Failure;Improve shortness of breath with ADL's;Weight Management/Obesity Weight Management/Obesity;Improve shortness of breath with ADL's;Increase knowledge of respiratory medications and ability to use respiratory devices properly.   Review Pat reFraser Dints that her ADLs are harder due to her shortness of breath. 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 She denies any heart failure symptoms at this time. She checks her BP multiple times per week - 130/60-70. She has not met with RD yet. She is taking all her medication as directed and is not having any problems with them. Pat isFraser Dining well in rehab. Her weight is down a pound since starting.  She is doing better with her breathing.  She denies any heart failure symptoms.  Her pressures are doing well and she is doing well with her meds.  Her breathing continues ot improve some days, but there are days where it does not feel like it at much. Pat feFraser Din like her shortnes of breath has improved since starting rehab.  She will sometimes take her BP at home 120-130/70 at home. She has an appointment with her PCP regarding leg swelling and shortness of breath - gave her lasix. Her weight has been stable, she weighs herself daily. Fraser Din is doing well in rehab. Her weight is staying the same.  Her breathing has good days and bad days.  Today, talking on the treadmill is more difficult than normal for her.  She did talk to her doctor about her leg swelling.  She was started on a direutic but she has not been able to tell much difference.  Her blood pressures are doing okay as she has not needed it more than every other day. Pat uses diuretic as needed.  She can tell breathing is better.She is able to walk faster without getting short of breath.  her weight is steady.  She will complete LW next session.   Expected Outcomes ST: continue to monitor vitals LT: manage ADLs with less  shortness of breath Short: continue to work on breathing Long: conitnue to monitor risk factors. Short: continue to work on breathing, see if lasix helps with leg swelling Long: conitnue to monitor risk factors. Short: Continue to get used to dieretic Long: Continue to montior risk factors. ST/LT: complete LW and maintain exercise on her own            Core Components/Risk Factors/Patient Goals at Discharge (Final Review):   Goals and Risk Factor Review - 10/31/21 0923       Core Components/Risk Factors/Patient Goals Review   Personal Goals Review Weight Management/Obesity;Improve shortness of breath with ADL's;Increase knowledge of respiratory medications and ability to use respiratory devices properly.    Review Pat uses diuretic as needed.  She can tell breathing is better.She is able to walk faster without getting short of breath.  her weight is steady.  She will complete LW next session.    Expected Outcomes ST/LT: complete LW and maintain exercise on her own             ITP Comments:  ITP Comments     Row Name 07/01/21 1343 07/06/21 1032 07/12/21 0811 07/28/21 0925 08/03/21 0708   ITP Comments Initial orientation completed. Diagnosis can be found in [facility] 8/22. EP orientation scheduled for Wednesday 10/5 at 9:30. Completed 6MWT and gym orientation. Initial ITP created and sent for review to Dr. Ottie Glazier, Medical Director. First full day of exercise!  Patient was oriented to gym and equipment including functions, settings, policies, and procedures.  Patient's individual exercise prescription and treatment plan were reviewed.  All starting workloads were established based on the results of the 6 minute walk test done at initial orientation visit.  The plan for exercise progression was also introduced and progression will be customized based on patient's performance and goals. Complete initial RD consultation 30 Day review completed. Medical Director ITP review done, changes made as  directed, and signed approval by Medical Director.    Lyndon Name 08/31/21 0655 09/28/21 0710 10/26/21 0853 11/02/21 0921     ITP Comments 30 Day review completed. Medical Director ITP review done, changes made as directed, and signed approval by Medical Director. 30 Day review completed. Medical Director ITP review done, changes made as directed, and signed approval by Medical Director. 30 Day review completed. Medical Director ITP review done, changes made as directed, and signed approval by Medical Director. Doreatha graduated today from  rehab with 36 sessions completed.  Details of the patient's exercise prescription and what  She needs to do in order to continue the prescription and progress were discussed with patient.  Patient was given a copy of prescription and goals.  Patient verbalized understanding.  Saarah plans to continue to exercise by walking at home and going to the [employer].             Comments: discharge ITP

## 2021-11-09 ENCOUNTER — Ambulatory Visit (INDEPENDENT_AMBULATORY_CARE_PROVIDER_SITE_OTHER): Payer: Medicare Other

## 2021-11-09 VITALS — Ht 66.5 in | Wt 182.0 lb

## 2021-11-09 DIAGNOSIS — Z0000 Encounter for general adult medical examination without abnormal findings: Secondary | ICD-10-CM

## 2021-11-09 NOTE — Patient Instructions (Addendum)
°  [name] , Thank you for taking time to come for your Medicare Wellness Visit. I appreciate your ongoing commitment to your health goals. Please review the following plan we discussed and let me know if I can assist you in the future.   These are the goals we discussed:  Goals       Patient Stated     I would like to maintain my physical activity (pt-stated)      Post physical therapy exercises Weight goal 170lb        This is a list of the screening recommended for you and due dates:  Health Maintenance  Topic Date Due   Zoster (Shingles) Vaccine (2 of 2) 10/21/2019   COVID-19 Vaccine (6 - Booster) 11/25/2021*   Tetanus Vaccine  09/05/2027   Pneumonia Vaccine  Completed   Flu Shot  Completed   DEXA scan (bone density measurement)  Completed   HPV Vaccine  Aged Out  *Topic was postponed. The date shown is not the original due date.

## 2021-11-09 NOTE — Progress Notes (Signed)
Subjective:   Alison Huffman is a 81 y.o. female who presents for Medicare Annual (Subsequent) preventive examination.  Review of Systems    No ROS.  Medicare Wellness Virtual Visit.  Visual/audio telehealth visit, UTA vital signs.   See social history for additional risk factors.         Objective:    Today's Vitals   11/09/21 1040  Weight: 182 lb (82.6 kg)  Height: 5' 6.5" (1.689 m)   Body mass index is 28.94 kg/m.  Advanced Directives 11/09/2021 07/01/2021 05/18/2021 12/03/2020 11/17/2020 09/21/2020 08/02/2020  Does Patient Have a Medical Advance Directive? Yes Yes Yes Yes Yes Yes Yes  Type of Paramedic of [place];Living will - [place];Out of facility DNR (pink MOST or yellow form);Living will [place];Living will;Out of facility DNR (pink MOST or yellow form) Living will;Healthcare Power of [place];Living will -  Does patient want to make changes to medical advance directive? No - Patient declined No - Patient declined - - Yes (ED - Information included in AVS) No - Patient declined -  Copy of [place] in Chart? Yes - validated most recent copy scanned in chart (See row information) - - - - Yes - validated most recent copy scanned in chart (See row information) -  Would patient like information on creating a medical advance directive? - - - - - - -    Current Medications (verified) Outpatient Encounter Medications as of 11/09/2021  Medication Sig   acetaminophen (TYLENOL) 325 MG tablet Take 325-650 mg by mouth every 6 (six) hours as needed for mild pain or headache.    clobetasol ointment (TEMOVATE) 0.05 %    furosemide (LASIX) 20 MG tablet Take 1 tablet (20 mg total) by mouth daily as needed for edema.   hydrocerin (EUCERIN) CREA Apply 1 application topically 2 (two) times daily. To bilateral feet.   ketoconazole (NIZORAL) 2 % cream Apply 1 application topically  daily.   losartan (COZAAR) 25 MG tablet Take 1 tablet (25 mg total) by mouth daily.   methylcellulose (CITRUCEL) oral powder    polyethylene glycol (MIRALAX / GLYCOLAX) 17 g packet Take 17 g by mouth daily as needed for severe constipation.   tacrolimus (PROTOPIC) 0.1 % ointment Apply topically 2 (two) times daily as needed (for psoriasis).    Vitamin D, Ergocalciferol, 50 MCG (2000 UT) CAPS Take by mouth.   [DISCONTINUED] famotidine (PEPCID) 20 MG tablet Take 1 tablet by mouth 2 (two) times daily.   No facility-administered encounter medications on file as of 11/09/2021.    Allergies (verified) Etanercept and Nickel   History: Past Medical History:  Diagnosis Date   Ankle pain, right    Atypical chest pain    a. 10/2018 MV: EF 57%. No ischemia/infarct.   Coronary artery disease    Diastolic dysfunction    a. 04/2018 Echo: EF 55-60%, no rwma, Gr1 DD. Triv AI, mild MR. Mod dil LA. Nl RV fxn. PASP nl.   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 07/2018 s/p SJM Assurity MRI model PM2271 (Ser# [id number]).   Sleep apnea    "using nasal pilllows since ~ 12/2017" (07/22/2018)   TMJ (dislocation of temporomandibular joint)    Past Surgical History:  Procedure Laterality Date   APPENDECTOMY     BREAST CYST ASPIRATION Bilateral    BREAST CYST EXCISION Right 1978   benign   COLONOSCOPY     COLONOSCOPY WITH PROPOFOL N/A 07/10/2019   Procedure: COLONOSCOPY WITH PROPOFOL;   Surgeon: Lollie Sails, MD;  Location: [facility] ENDOSCOPY;  Service: Endoscopy;  Laterality: N/A;   ESOPHAGOGASTRODUODENOSCOPY (EGD) WITH PROPOFOL N/A 07/10/2019   Procedure: ESOPHAGOGASTRODUODENOSCOPY (EGD) WITH PROPOFOL;  Surgeon: Lollie Sails, MD;  Location: [facility] ENDOSCOPY;  Service: Endoscopy;  Laterality: N/A;   HYSTEROSCOPY WITH D & C N/A 11/05/2018   Procedure: DILATATION AND CURETTAGE /HYSTEROSCOPY;  Surgeon: Homero Fellers, MD;  Location: [facility] ORS;  Service: Gynecology;  Laterality: N/A;   INSERT / REPLACE / REMOVE PACEMAKER     PACEMAKER IMPLANT N/A 07/23/2018   SJM Assurity [id number] implanted by Dr Rayann Heman for mobitz II second degree AV block   UTERINE POLYPS REMOVAL     Family History  Problem Relation Age of Onset   Stroke Mother    Atrial fibrillation Mother    Breast cancer Mother    Stroke Father    Breast cancer Maternal Aunt    Breast cancer Paternal Aunt    Social History   Socioeconomic History   Marital status: Single    Spouse name: Not on file   Number of children: Not on file   Years of education: Not on file   Highest education level: Not on file  Occupational History   Not on file  Tobacco Use   Smoking status: Never   Smokeless tobacco: Never   Tobacco comments:    07/22/2018 "smoked some in college; 1960s;  nothing since"  Vaping Use   Vaping Use: Never used  Substance and Sexual Activity   Alcohol use: Yes    Comment: rarely   Drug use: Never   Sexual activity: Not Currently    Birth control/protection: Post-menopausal  Other Topics Concern   Not on file  Social History Narrative   Right Handed   Lives in an apartment. 5th floor but has elevator    Drinks Caffeine.    Social Determinants of Health   Financial Resource Strain: Low Risk    Difficulty of Paying Living Expenses: Not hard at all  Food Insecurity: No Food Insecurity   Worried About Charity fundraiser in the Last Year: Never true   [place] in the Last Year:  Never true  Transportation Needs: No Transportation Needs   Lack of Transportation (Medical): No   Lack of Transportation (Non-Medical): No  Physical Activity: Sufficiently Active   Days of Exercise per Week: 4 days   Minutes of Exercise per Session: 60 min  Stress: No Stress Concern Present   Feeling of Stress : Not at all  Social Connections: Unknown   Frequency of Communication with Friends and Family: More than three times a week   Frequency of Social Gatherings with Friends and Family: Not on file   Attends Religious Services: Not  on file   Active Member of Clubs or Organizations: Not on file   Attends Club or Organization Meetings: Not on file   Marital Status: Not on file    Tobacco Counseling Counseling given: Not Answered Tobacco comments: 07/22/2018 "smoked some in college; 1960s;  nothing since"   Clinical Intake:  Pre-visit preparation completed: Yes        Diabetes: No  How often do you need to have someone help you when you read instructions, pamphlets, or other written materials from your doctor or pharmacy?: 1 - Never   Interpreter Needed?: No      Activities of Daily Living In your present state of health, do you have any difficulty performing the following activities: 11/09/2021  Hearing? Y  Preparing Food and eating ? N  Using the Toilet? N  In the past six months, have you accidently leaked urine? N  Do you have problems with loss of bowel control? N  Managing your Medications? N  Managing your Finances? N  Housekeeping or managing your Housekeeping? Y  Comment Maid assist  Some recent data might be hidden    Patient Care Team: Leone Haven, MD as PCP - General (Family Medicine) Vickie Epley, MD as PCP - Electrophysiology (Cardiology) Alda Berthold, DO as Consulting Physician (Neurology) Minna Merritts, MD as Consulting Physician (Cardiology)  Indicate any recent Medical Services you may have received from other than [facility]  providers in the past year (date may be approximate).     Assessment:   This is a routine wellness examination for [place].  Virtual Visit via Telephone Note  I connected with  SHIREL MALLIS on 11/09/21 at 10:30 AM EST by telephone and verified that I am speaking with the correct person using two identifiers.  Persons participating in the virtual visit: patient/Nurse Health Advisor   I discussed the limitations, risks, security and privacy concerns of performing an evaluation and management service by telephone and the availability of in person appointments. The patient expressed understanding and agreed to proceed.  Interactive audio and video telecommunications were attempted between this nurse and patient, however failed, due to patient having technical difficulties OR patient did not have access to video capability.  We continued and completed visit with audio only.  Some vital signs may be absent or patient reported.   Hearing/Vision screen Hearing Screening - Comments:: Followed by [facility] Hearing aid, bilateral Vision Screening - Comments:: Followed by [facility] Ophthalmologist Wears L eye contact lenses Cataract extraction, bilateral They have regular follow up with the ophthalmologist  Dietary issues and exercise activities discussed:   Healthy diet Good water intake   Goals Addressed               This Visit's Progress     Patient Stated     I would like to maintain my physical activity (pt-stated)        Post physical therapy exercises Weight goal 170lb       Depression Screen PHQ 2/9 Scores 10/19/2021 09/01/2021 08/02/2021 07/06/2021 04/25/2021 01/10/2021 09/21/2020  PHQ - 2 Score 0 0 0 1 0 0 0  PHQ- 9 Score 3 - 3 8 - - -    Fall Risk Fall Risk  11/09/2021 09/01/2021 07/01/2021 05/18/2021 04/25/2021  Falls in the past year? 0 0 0 0 0  Number falls in past yr: 0 0 - 0 -  Injury with Fall? - 0 - 0 -  Risk for fall due  to : - No Fall Risks - -  -  Follow up Falls evaluation completed Falls evaluation completed - - -   FALL RISK PREVENTION PERTAINING TO THE HOME: Home free of loose throw rugs in walkways, pet beds, electrical cords, etc? Yes  Adequate lighting in your home to reduce risk of falls? Yes   ASSISTIVE DEVICES UTILIZED TO PREVENT FALLS: Life alert? Yes  Use of a cane, walker or w/c? No   TIMED UP AND GO: Was the test performed? No .   Cognitive Function: Patient is alert and oriented x3.  Enjoys playing bridge and other brain health stimulating activities.  MMSE - Mini Mental State Exam 05/03/2018  Orientation to time 5  Orientation to Place 5  Registration 3  Attention/ Calculation 5  Recall 3  Language- name 2 objects 2  Language- repeat 1  Language- follow 3 step command 3  Language- read & follow direction 1  Write a sentence 1  Copy design 1  Total score 30     6CIT Screen 09/21/2020 09/19/2019  What Year? 0 points 0 points  What month? 0 points 0 points  What time? 0 points 0 points  Count back from 20 0 points 0 points  Months in reverse 0 points 0 points  Repeat phrase 0 points 0 points  Total Score 0 0    Immunizations Immunization History  Administered Date(s) Administered   Fluad Quad(high Dose 65+) 06/18/2019, 10/06/2020   Hepatitis B 01/01/1990, 01/31/1990, 07/03/1990   Influenza Whole 07/07/1999   Influenza, High Dose Seasonal PF 07/13/2021   Influenza-Unspecified 06/18/2017, 07/10/2018   PFIZER(Purple Top)SARS-COV-2 Vaccination 10/09/2019, 11/05/2019, 09/09/2020   Pneumococcal Conjugate-13 05/06/2014   Pneumococcal Polysaccharide-23 05/10/2005   Tdap 09/04/2017   Unspecified SARS-COV-2 Vaccination 10/09/2019, 11/05/2019   Zoster Recombinat (Shingrix) 08/26/2019   Zoster, Live 07/28/2008, 08/11/2008   Screening Tests Health Maintenance  Topic Date Due   Zoster Vaccines- Shingrix (2 of 2) 10/21/2019   COVID-19 Vaccine (6 - Booster) 11/25/2021 (Originally 11/04/2020)    TETANUS/TDAP  09/05/2027   Pneumonia Vaccine 38+ Years old  Completed   INFLUENZA VACCINE  Completed   DEXA SCAN  Completed   HPV VACCINES  Aged Out   Health Maintenance Health Maintenance Due  Topic Date Due   Zoster Vaccines- Shingrix (2 of 2) 10/21/2019   Lung Cancer Screening: (Low Dose CT Chest recommended if Age 17-80 years, 30 pack-year currently smoking OR have quit w/in 15years.) does not qualify.   Hepatitis C Screening: does not qualify  Vision Screening: Recommended annual ophthalmology exams for early detection of glaucoma and other disorders of the eye.  Dental Screening: Recommended annual dental exams for proper oral hygiene  Community Resource Referral / Chronic Care Management: CRR required this visit?  No   CCM required this visit?  No      Plan:   Keep all routine maintenance appointments.   I have personally reviewed and noted the following in the patients chart:   Medical and social history Use of alcohol, tobacco or illicit drugs  Current medications and supplements including opioid prescriptions. Not taking opioids.  Functional ability and status Nutritional status Physical activity Advanced directives List of other physicians Hospitalizations, surgeries, and ER visits in previous 12 months Vitals Screenings to include cognitive, depression, and falls Referrals and appointments  In addition, I have reviewed and discussed with patient certain preventive protocols, quality metrics, and best practice recommendations. A written personalized care plan for preventive services as well as  general preventive health recommendations were provided to patient via mychart.     Varney Biles, LPN   06/05/4067

## 2021-11-10 DIAGNOSIS — I7091 Generalized atherosclerosis: Secondary | ICD-10-CM | POA: Diagnosis not present

## 2021-11-10 DIAGNOSIS — L603 Nail dystrophy: Secondary | ICD-10-CM | POA: Diagnosis not present

## 2021-11-22 ENCOUNTER — Other Ambulatory Visit: Payer: Medicare Other

## 2021-11-22 ENCOUNTER — Other Ambulatory Visit: Payer: Self-pay

## 2021-11-22 DIAGNOSIS — N83299 Other ovarian cyst, unspecified side: Secondary | ICD-10-CM | POA: Diagnosis not present

## 2021-11-22 DIAGNOSIS — N83202 Unspecified ovarian cyst, left side: Secondary | ICD-10-CM

## 2021-11-23 LAB — OVARIAN MALIGNANCY RISK-ROMA
Cancer Antigen (CA) 125: 6.7 U/mL (ref 0.0–38.1)
HE4: 75.8 pmol/L (ref 0.0–96.9)
Postmenopausal ROMA: 1
Premenopausal ROMA: 1.71 — ABNORMAL HIGH

## 2021-11-23 LAB — POSTMENOPAUSAL INTERP: LOW

## 2021-11-23 LAB — PREMENOPAUSAL INTERP: HIGH

## 2021-11-24 ENCOUNTER — Other Ambulatory Visit: Payer: Self-pay | Admitting: Obstetrics and Gynecology

## 2021-11-24 ENCOUNTER — Other Ambulatory Visit: Payer: Self-pay

## 2021-11-24 ENCOUNTER — Ambulatory Visit (INDEPENDENT_AMBULATORY_CARE_PROVIDER_SITE_OTHER): Payer: Medicare Other

## 2021-11-24 DIAGNOSIS — N83299 Other ovarian cyst, unspecified side: Secondary | ICD-10-CM

## 2021-11-24 DIAGNOSIS — N83202 Unspecified ovarian cyst, left side: Secondary | ICD-10-CM

## 2021-11-28 ENCOUNTER — Encounter
Payer: Medicare Other | Attending: Physical Medicine and Rehabilitation | Admitting: Physical Medicine and Rehabilitation

## 2021-11-28 ENCOUNTER — Other Ambulatory Visit: Payer: Self-pay

## 2021-11-28 ENCOUNTER — Encounter: Payer: Self-pay | Admitting: Physical Medicine and Rehabilitation

## 2021-11-28 VITALS — BP 145/73 | HR 69 | Ht 66.5 in | Wt 182.0 lb

## 2021-11-28 DIAGNOSIS — G610 Guillain-Barre syndrome: Secondary | ICD-10-CM | POA: Diagnosis not present

## 2021-11-28 NOTE — Progress Notes (Signed)
Subjective:    Patient ID: Alison Huffman, female    DOB: 10-18-1940, 81 y.o.   MRN: [id number]  HPI  Patient is an 81 yr old female with Guillain Barre syndrome-  Was dx'd in June/July 2021 (stopped breathing July 1st 2021)- hx of OSA, dCHF, chest tubes due to B/L pneumothorax due to Code blue/being bagged, seizure, hyper and hyponatremia- acute; S/P IVIG and plasmapharesis. As well as R vocal cord paralysis. Now eating regular diet! And dysphagia has resolved.  Hand arthritis.  Has been 1 year since [place] pretty well.   36 sessions of pulmonary rehab.  Helped SOB/DOE some/quite a bit.   Not bothering her now when talking or much when walking anymore. Finished 2 weeks ago.   No assistive device anymore.  Can now walk ~ 1 mile at a time.   Hip flexion doing well-  Did the nustep a lot- walked or did treadmill and hand weights 3x/week-   Has life alert on site at independent living.  Off assistive device since October 2022.   No pain.   Does have tingling bottom of feet that is tolerable- intermittent- when sitting at rest, doesn't feel it.   Pain Inventory Average Pain 1 Pain Right Now 1 My pain is intermittent and tingling  In the last 24 hours, has pain interfered with the following? General activity 0 Relation with others 0 Enjoyment of life 0 What TIME of day is your pain at its worst? varies Sleep (in general) Good  Pain is worse with: sitting and unsure Pain improves with: therapy/exercise Relief from Meds:  na  Family History  Problem Relation Age of Onset   Stroke Mother    Atrial fibrillation Mother    Breast cancer Mother    Stroke Father    Breast cancer Maternal Aunt    Breast cancer Paternal Aunt    Social History   Socioeconomic History   Marital status: Single    Spouse name: Not on file   Number of children: Not on file   Years of education: Not on file   Highest education level: Not on file  Occupational History    Not on file  Tobacco Use   Smoking status: Never   Smokeless tobacco: Never   Tobacco comments:    07/22/2018 "smoked some in college; 1960s;  nothing since"  Vaping Use   Vaping Use: Never used  Substance and Sexual Activity   Alcohol use: Yes    Comment: rarely   Drug use: Never   Sexual activity: Not Currently    Birth control/protection: Post-menopausal  Other Topics Concern   Not on file  Social History Narrative   Right Handed   Lives in an apartment. 5th floor but has elevator    Drinks Caffeine.    Social Determinants of Health   Financial Resource Strain: Low Risk    Difficulty of Paying Living Expenses: Not hard at all  Food Insecurity: No Food Insecurity   Worried About Charity fundraiser in the Last Year: Never true   [place] in the Last Year: Never true  Transportation Needs: No Transportation Needs   Lack of Transportation (Medical): No   Lack of Transportation (Non-Medical): No  Physical Activity: Sufficiently Active   Days of Exercise per Week: 4 days   Minutes of Exercise per Session: 60 min  Stress: No Stress Concern Present   Feeling of Stress : Not at all  Social  Connections: Unknown   Frequency of Communication with Friends and Family: More than three times a week   Frequency of Social Gatherings with Friends and Family: Not on file   Attends Religious Services: Not on file   Active Member of Clubs or Organizations: Not on file   Attends Archivist Meetings: Not on file   Marital Status: Not on file   Past Surgical History:  Procedure Laterality Date   APPENDECTOMY     BREAST CYST ASPIRATION Bilateral    BREAST CYST EXCISION Right 1978   benign   COLONOSCOPY     COLONOSCOPY WITH PROPOFOL N/A 07/10/2019   Procedure: COLONOSCOPY WITH PROPOFOL;  Surgeon: Lollie Sails, MD;  Location: [facility] ENDOSCOPY;  Service: Endoscopy;  Laterality: N/A;   ESOPHAGOGASTRODUODENOSCOPY (EGD) WITH PROPOFOL N/A 07/10/2019   Procedure:  ESOPHAGOGASTRODUODENOSCOPY (EGD) WITH PROPOFOL;  Surgeon: Lollie Sails, MD;  Location: [facility] ENDOSCOPY;  Service: Endoscopy;  Laterality: N/A;   HYSTEROSCOPY WITH D & C N/A 11/05/2018   Procedure: DILATATION AND CURETTAGE /HYSTEROSCOPY;  Surgeon: Homero Fellers, MD;  Location: [facility] ORS;  Service: Gynecology;  Laterality: N/A;   INSERT / REPLACE / REMOVE PACEMAKER     PACEMAKER IMPLANT N/A 07/23/2018   SJM Assurity [id number] implanted by Dr Rayann Heman for mobitz II second degree AV block   UTERINE POLYPS REMOVAL     Past Surgical History:  Procedure Laterality Date   APPENDECTOMY     BREAST CYST ASPIRATION Bilateral    BREAST CYST EXCISION Right 1978   benign   COLONOSCOPY     COLONOSCOPY WITH PROPOFOL N/A 07/10/2019   Procedure: COLONOSCOPY WITH PROPOFOL;  Surgeon: Lollie Sails, MD;  [facility] END[facility]COPY;  Service: Endoscopy;  Laterality: N/A;   ESOPHAGOGASTRODUODENOSCOPY (EGD) WITH PROPOFOL N/A 07/10/2019   Procedure: ESOPHAGOGASTRODUODENOSCOPY (EGD) WITH PROPOFOL;  Surgeon: Lollie Sails, MD;  Location[facility] ENDOSCOPY;  Service: Endoscopy;  Laterality: N/A;   HYSTEROSCOPY WITH D & C N/A 11/05/2018   Procedure: DILATATION AND CURETTAGE /HYSTEROSCOPY;  Surgeon: Homero Fellers, MD;  Location: [facility] ORS;  Service: Gynecology;  Laterality: N/A;   INSERT / REPLACE / REMOVE PACEMAKER     PACEMAKER IMPLANT N/A 07/23/2018   SJM Assurity [id number] implanted by Dr Rayann Heman for mobitz II second degree AV block   UTERINE POLYPS REMOVAL     Past Medical History:  Diagnosis Date   Ankle pain, right    Atypical chest pain    a. 10/2018 MV: EF 57%. No ischemia/infarct.   Coronary artery disease    Diastolic dysfunction    a. 04/2018 Echo: EF 55-60%, no rwma, Gr1 DD. Triv AI, mild MR. Mod dil LA. Nl RV fxn. PASP nl.   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 07/2018 s/p SJM Assurity MRI model PM2271 (Ser# [id number]).   Sleep  apnea    "using nasal pilllows since ~ 12/2017" (07/22/2018)   TMJ (dislocation of temporomandibular joint)    BP (!) 145/73    Pulse 69    Ht 5' 6.5" (1.689 m)    Wt 182 lb (82.6 kg)    SpO2 94%    BMI 28.94 kg/m   Opioid Risk Score:   Fall Risk Score:  `1  Depression screen PHQ 2/9  Depression screen [facility] 2/9 11/28/2021 10/19/2021 09/01/2021 08/02/2021 07/06/2021 04/25/2021 01/10/2021  Decreased Interest 0 0 0 0 1 0 0  Down, Depressed, Hopeless 0 0 0 0 0 0 0  PHQ - 2 Score 0 0 0 0 1 0 0  Altered sleeping - 0 - 1 2 - -  Tired, decreased energy - 1 - 1 2 - -  Change in appetite - 1 - 1 1 - -  Feeling bad or failure about yourself  - 0 - 0 0 - -  Trouble concentrating - 1 - 0 1 - -  Moving slowly or fidgety/restless - 0 - 0 1 - -  Suicidal thoughts - 0 - 0 0 - -  PHQ-9 Score - 3 - 3 8 - -  Difficult doing work/chores - Not difficult at all - Somewhat difficult Somewhat difficult - -  Some recent data might be hidden     Review of Systems  Musculoskeletal:        Tingling in feet  Neurological:  Positive for numbness.       Tingling  All other systems reviewed and are negative.     Objective:   Physical Exam Awake, alert, appropriate, no assistive device, NAD MS: 5/5 in UE B/L - in deltoids, biceps, triceps, WE, grip and FA B/L  LE's- RLE- HF 5-/5; otherwise KE/KF/DF and PF 5/5 LLE- 5/5 in same muscles tested  Neuro: Decreased to light touch just bottom of feet B/L  Otherwise intact to light touch in all 4  extremities Actually has had return of Patella and achilles DTRs 1+ B/L        Assessment & Plan:   Patient is an 81 yr old female with Guillain Barre syndrome-  Was dx'd in June/July 2021 (stopped breathing July 1st 2021)- hx of OSA, dCHF, chest tubes due to B/L pneumothorax due to Code blue/being bagged, seizure, hyper and hyponatremia- acute; S/P IVIG and plasmapharesis. As well as R vocal cord paralysis. Now eating regular diet! And dysphagia has resolved.  Hand arthritis.  Has been 18 month since dx of GBS   Nerve tingling- Lidocaine ointment or cream- 4 (is over the counter) -5%- (is prescription) can be given by PCP to use if needed- can use up to 4x/day as needed.   2.  Still work with recumbent ir regular bike at least 3 days/week to work on R hip flexion weakness.    3. F/U as needed if has any issues.

## 2021-11-28 NOTE — Patient Instructions (Addendum)
Patient is an 81 yr old female with Guillain Barre syndrome-  Was dx'd in June/July 2021 (stopped breathing July 1st 2021)- hx of OSA, dCHF, chest tubes due to B/L pneumothorax due to Code blue/being bagged, seizure, hyper and hyponatremia- acute; S/P IVIG and plasmapharesis. As well as R vocal cord paralysis. Now eating regular diet! And dysphagia has resolved.  Hand arthritis.  Has been 18 month since dx of GBS   Nerve tingling- Lidocaine ointment or cream- 4 (is over the counter) -5%- (is prescription) can be given by PCP to use if needed- can use up to 4x/day as needed.   2.  Still work with recumbent ir regular bike at least 3 days/week to work on R hip flexion weakness.    3. F/U as needed if has any issues.    4. Take a walking stick if goes on a hike/uneven terrain.

## 2021-11-30 DIAGNOSIS — Z20822 Contact with and (suspected) exposure to covid-19: Secondary | ICD-10-CM | POA: Diagnosis not present

## 2021-12-01 ENCOUNTER — Ambulatory Visit (INDEPENDENT_AMBULATORY_CARE_PROVIDER_SITE_OTHER): Payer: Medicare Other | Admitting: Obstetrics and Gynecology

## 2021-12-01 ENCOUNTER — Encounter: Payer: Self-pay | Admitting: Obstetrics and Gynecology

## 2021-12-01 ENCOUNTER — Other Ambulatory Visit: Payer: Self-pay

## 2021-12-01 VITALS — BP 120/70 | Ht 66.0 in | Wt 181.4 lb

## 2021-12-01 DIAGNOSIS — N83202 Unspecified ovarian cyst, left side: Secondary | ICD-10-CM

## 2021-12-01 NOTE — Progress Notes (Signed)
? ?Patient ID: Alison Huffman, female   DOB: [birth year], 81 y.o.   MRN: [id number] ? ?Reason for Consult: Follow-up ?  ?Referred by Leone Haven, MD ? ?Subjective:  ?   ?HPI: ? ?Alison Huffman is a 81 y.o. female she is following up today regarding left ovarian cyst.  We have been monitoring this cyst since December 2019.  Since that time she has not had significant growth changes in the left ovarian cyst.  Her Roma testing has remained within normal limits.  She has no complaints today.  She denies any vaginal bleeding.  She has been working to recover her health after Rosalee Kaufman and recently underwent pulmonary rehab which she reports helped a lot with SOB. ? ?Gynecological History ? ?No LMP recorded. Patient is postmenopausal. ? ?Past Medical History:  ?Diagnosis Date  ? Ankle pain, right   ? Atypical chest pain   ? a. 10/2018 MV: EF 57%. No ischemia/infarct.  ? Coronary artery disease   ? Diastolic dysfunction   ? a. 04/2018 Echo: EF 55-60%, no rwma, Gr1 DD. Triv AI, mild MR. Mod dil LA. Nl RV fxn. PASP nl.  ? GERD (gastroesophageal reflux disease)   ? 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 Headache   ? "dull one sometimes daily, at least weekly in last couple months" (07/22/2018)  ? Hyperlipidemia   ? Hypertension   ? 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 Joint pain in fingers of right hand   ? Lichen sclerosus   ? Osteopenia   ? Pneumonia 2012? X 1  ? 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 Psoriasis   ? Psoriatic arthritis ([place])   ? " dx'd the 1st of this year, 2019" (07/22/2018)  ? Psoriatic arthritis ([place])   ? Rosacea   ? Seborrheic keratosis   ? Second degree heart block   ? a. 07/2018 s/p SJM Assurity MRI model [id number] (Ser# [id number]).  ? Sleep apnea   ? "using nasal pilllows since ~ 12/2017" (07/22/2018)  ? TMJ (dislocation of temporomandibular joint)   ? ?Family History  ?Problem Relation Age of Onset  ? Stroke Mother   ? Atrial fibrillation Mother   ? Breast cancer Mother   ? Stroke Father   ? Breast  cancer Maternal Aunt   ? Breast cancer Paternal Aunt   ? ?Past Surgical History:  ?Procedure Laterality Date  ? APPENDECTOMY    ? BREAST CYST ASPIRATION Bilateral   ? BREAST CYST EXCISION Right 1978  ? benign  ? COLONOSCOPY    ? COLONOSCOPY WITH PROPOFOL N/A 07/10/2019  ? Procedure: COLONOSCOPY WITH PROPOFOL;  Surgeon: Lollie Sails, MD;  Location: [facility] ENDOSCOPY;  Service: Endoscopy;  Laterality: N/A;  ? ESOPHAGOGASTRODUODENOSCOPY (EGD) WITH PROPOFOL N/A 07/10/2019  ? Procedure: ESOPHAGOGASTRODUODENOSCOPY (EGD) WITH PROPOFOL;  Surgeon: Lollie Sails, MD;  Location: [facility] ENDOSCOPY;  Service: Endoscopy;  Laterality: N/A;  ? HYSTEROSCOPY WITH D & C N/A 11/05/2018  ? Procedure: DILATATION AND CURETTAGE /HYSTEROSCOPY;  Surgeon: Homero Fellers, MD;  Location: [facility] ORS;  Service: Gynecology;  Laterality: N/A;  ? INSERT / REPLACE / REMOVE PACEMAKER    ? PACEMAKER IMPLANT N/A 07/23/2018  ? SJM Assurity [id number] implanted by Dr Rayann Heman for mobitz II second degree AV block  ? UTERINE POLYPS REMOVAL    ? ? ?Short Social History:  ?Social History  ? ?Tobacco Use  ? Smoking status: Never  ? Smokeless tobacco: Never  ? Tobacco comments:  ?  07/22/2018 "smoked some  in college; 1960s;  nothing since"  ?Substance Use Topics  ? Alcohol use: Yes  ?  Comment: rarely  ? ? ?Allergies  ?Allergen Reactions  ? Etanercept   ?  Guillain Baree  ? Nickel   ? ? ?Current Outpatient Medications  ?Medication Sig Dispense Refill  ? acetaminophen (TYLENOL) 325 MG tablet Take 325-650 mg by mouth every 6 (six) hours as needed for mild pain or headache.     ? clobetasol ointment (TEMOVATE) 0.05 %     ? furosemide (LASIX) 20 MG tablet Take 1 tablet (20 mg total) by mouth daily as needed for edema. 30 tablet 3  ? hydrocerin (EUCERIN) CREA Apply 1 application topically 2 (two) times daily. To bilateral feet.  0  ? ketoconazole (NIZORAL) 2 % cream Apply 1 application topically daily. 15 g 0  ? losartan (COZAAR) 25 MG tablet Take 1 tablet (25 mg  total) by mouth daily. 90 tablet 3  ? methylcellulose (CITRUCEL) oral powder     ? polyethylene glycol (MIRALAX / GLYCOLAX) 17 g packet Take 17 g by mouth daily as needed for severe constipation. 14 each 0  ? tacrolimus (PROTOPIC) 0.1 % ointment Apply topically 2 (two) times daily as needed (for psoriasis).     ? Vitamin D, Ergocalciferol, 50 MCG (2000 UT) CAPS Take by mouth.    ? ?No current facility-administered medications for this visit.  ? ? ?Review of Systems  ?Constitutional: Negative for chills, fatigue, fever and unexpected weight change.  ?HENT: Negative for trouble swallowing.  ?Eyes: Negative for loss of vision.  ?Respiratory: Negative for cough, shortness of breath and wheezing.  ?Cardiovascular: Negative for chest pain, leg swelling, palpitations and syncope.  ?GI: Negative for abdominal pain, blood in stool, diarrhea, nausea and vomiting.  ?GU: Negative for difficulty urinating, dysuria, frequency and hematuria.  ?Musculoskeletal: Negative for back pain, leg pain and joint pain.  ?Skin: Negative for rash.  ?Neurological: Negative for dizziness, headaches, light-headedness, numbness and seizures.  ?Psychiatric: Negative for behavioral problem, confusion, depressed mood and sleep disturbance.   ? ?   ?Objective:  ?Objective  ? ?Vitals:  ? 12/01/21 0926  ?BP: 120/70  ?Weight: 181 lb 6.4 oz (82.3 kg)  ?Height: 5\' 6"  (1.676 m)  ? ?Body mass index is 29.28 kg/m?. ? ?Physical Exam ?Vitals and nursing note reviewed. Exam conducted with a chaperone present.  ?Constitutional:   ?   Appearance: Normal appearance.  ?HENT:  ?   Head: Normocephalic and atraumatic.  ?Eyes:  ?   Extraocular Movements: Extraocular movements intact.  ?   Pupils: Pupils are equal, round, and reactive to light.  ?Cardiovascular:  ?   Rate and Rhythm: Normal rate and regular rhythm.  ?Pulmonary:  ?   Effort: Pulmonary effort is normal.  ?   Breath sounds: Normal breath sounds.  ?Abdominal:  ?   General: Abdomen is flat.  ?   Palpations:  Abdomen is soft.  ?Musculoskeletal:  ?   Cervical back: Normal range of motion.  ?Skin: ?   General: Skin is warm and dry.  ?Neurological:  ?   General: No focal deficit present.  ?   Mental Status: She is alert and oriented to person, place, and time.  ?Psychiatric:     ?   Behavior: Behavior normal.     ?   Thought Content: Thought content normal.     ?   Judgment: Judgment normal.  ? ? ?Assessment/Plan:  ?  ? ?81 year old with complex left  ovarian cyst. ?Patient has demonstrated stability over the last 3 years.  At this point would recommend discontinue monitoring. ?Reviewed symptoms of ovarian cancer such as early satiety unexplained weight gain changes in bowel movements vaginal bleeding and pelvic pressure pain. ?We will follow-up if she has any of the symptoms in the future. ?At this point would recommend return to routine annual screening. ? ?More than 15 minutes were spent face to face with the patient in the room, reviewing the medical record, labs and images, and coordinating care for the patient. The plan of management was discussed in detail and counseling was provided.  ?  ?Adrian Prows MD ?[facility] ?12/01/2021 ?9:33 AM ? ? ?

## 2021-12-16 ENCOUNTER — Other Ambulatory Visit: Payer: Self-pay

## 2021-12-16 ENCOUNTER — Ambulatory Visit
Admission: RE | Admit: 2021-12-16 | Discharge: 2021-12-16 | Disposition: A | Discharge status: Home / Self Care | Payer: Medicare Other | Source: Ambulatory Visit | Attending: Pulmonary Disease | Admitting: Pulmonary Disease

## 2021-12-16 DIAGNOSIS — R918 Other nonspecific abnormal finding of lung field: Secondary | ICD-10-CM | POA: Insufficient documentation

## 2021-12-16 DIAGNOSIS — J479 Bronchiectasis, uncomplicated: Secondary | ICD-10-CM | POA: Diagnosis not present

## 2021-12-16 DIAGNOSIS — J8410 Pulmonary fibrosis, unspecified: Secondary | ICD-10-CM | POA: Diagnosis not present

## 2021-12-16 DIAGNOSIS — R911 Solitary pulmonary nodule: Secondary | ICD-10-CM | POA: Diagnosis not present

## 2021-12-19 ENCOUNTER — Telehealth: Payer: Self-pay | Admitting: Pulmonary Disease

## 2021-12-19 NOTE — Telephone Encounter (Signed)
Alison Huffman, CMA  ?12/19/2021  2:35 PM EDT   ?  ?ATC patient ?Permission from Dr. Halford Chessman to double book 3/23 in [place] for patient if she is willing to see him that day.   ? Chesley Mires, MD  ?12/19/2021  1:30 PM EDT   ?  ?Please schedule ROV with me to review CT chest results.  ? ? ?Spoke to patient and scheduled OV 12/22/2021 at 12:00 ?Patient is aware of date/time and voiced her understanding.  ?Nothing further needed ? ?

## 2021-12-22 ENCOUNTER — Other Ambulatory Visit: Payer: Self-pay

## 2021-12-22 ENCOUNTER — Ambulatory Visit (INDEPENDENT_AMBULATORY_CARE_PROVIDER_SITE_OTHER): Payer: Medicare Other | Admitting: Pulmonary Disease

## 2021-12-22 ENCOUNTER — Encounter: Payer: Self-pay | Admitting: Pulmonary Disease

## 2021-12-22 ENCOUNTER — Other Ambulatory Visit
Admission: RE | Admit: 2021-12-22 | Discharge: 2021-12-22 | Disposition: A | Discharge status: Home / Self Care | Payer: Medicare Other | Source: Ambulatory Visit | Attending: Pulmonary Disease | Admitting: Pulmonary Disease

## 2021-12-22 VITALS — BP 136/74 | HR 77 | Temp 97.6°F | Ht 66.0 in | Wt 182.2 lb

## 2021-12-22 DIAGNOSIS — J849 Interstitial pulmonary disease, unspecified: Secondary | ICD-10-CM

## 2021-12-22 DIAGNOSIS — J479 Bronchiectasis, uncomplicated: Secondary | ICD-10-CM | POA: Diagnosis not present

## 2021-12-22 DIAGNOSIS — R0602 Shortness of breath: Secondary | ICD-10-CM

## 2021-12-22 DIAGNOSIS — G4733 Obstructive sleep apnea (adult) (pediatric): Secondary | ICD-10-CM | POA: Diagnosis not present

## 2021-12-22 LAB — CBC WITH DIFFERENTIAL/PLATELET
Abs Immature Granulocytes: 0.01 10*3/uL (ref 0.00–0.07)
Basophils Absolute: 0 10*3/uL (ref 0.0–0.1)
Basophils Relative: 1 %
Eosinophils Absolute: 0.1 10*3/uL (ref 0.0–0.5)
Eosinophils Relative: 2 %
HCT: 39.2 % (ref 36.0–46.0)
Hemoglobin: 12.6 g/dL (ref 12.0–15.0)
Immature Granulocytes: 0 %
Lymphocytes Relative: 31 %
Lymphs Abs: 1.7 10*3/uL (ref 0.7–4.0)
MCH: 30.4 pg (ref 26.0–34.0)
MCHC: 32.1 g/dL (ref 30.0–36.0)
MCV: 94.5 fL (ref 80.0–100.0)
Monocytes Absolute: 0.4 10*3/uL (ref 0.1–1.0)
Monocytes Relative: 8 %
Neutro Abs: 3.2 10*3/uL (ref 1.7–7.7)
Neutrophils Relative %: 58 %
Platelets: 232 10*3/uL (ref 150–400)
RBC: 4.15 MIL/uL (ref 3.87–5.11)
RDW: 12.7 % (ref 11.5–15.5)
WBC: 5.4 10*3/uL (ref 4.0–10.5)
nRBC: 0 % (ref 0.0–0.2)

## 2021-12-22 LAB — COMPREHENSIVE METABOLIC PANEL
ALT: 14 U/L (ref 0–44)
AST: 18 U/L (ref 15–41)
Albumin: 4 g/dL (ref 3.5–5.0)
Alkaline Phosphatase: 85 U/L (ref 38–126)
Anion gap: 8 (ref 5–15)
BUN: 11 mg/dL (ref 8–23)
CO2: 27 mmol/L (ref 22–32)
Calcium: 9.3 mg/dL (ref 8.9–10.3)
Chloride: 105 mmol/L (ref 98–111)
Creatinine, Ser: 0.56 mg/dL (ref 0.44–1.00)
GFR, Estimated: >60 mL/min (ref >60)
Glucose, Bld: 93 mg/dL (ref 70–99)
Potassium: 3.9 mmol/L (ref 3.5–5.1)
Sodium: 140 mmol/L (ref 135–145)
Total Bilirubin: 0.9 mg/dL (ref 0.3–1.2)
Total Protein: 7.5 g/dL (ref 6.5–8.1)

## 2021-12-22 LAB — SEDIMENTATION RATE: Sed Rate: 27 mm/hr (ref 0–30)

## 2021-12-22 LAB — C-REACTIVE PROTEIN: CRP: 0.7 mg/dL (ref <1.0)

## 2021-12-22 NOTE — Patient Instructions (Signed)
Lab tests today ? ?Will schedule pulmonary function test ? ?Follow up in 6 weeks with Dr. Halford Chessman or Nurse Practitioner ?

## 2021-12-22 NOTE — Progress Notes (Signed)
? ? Pulmonary, Critical Care, and Sleep Medicine ? ?Chief Complaint  ?Patient presents with  ? Follow-up  ?  Wearing cpap avg 8hr nightly- pressure and mask is okay. Review CT.   ? ? ?Constitutional:  ?BP 136/74 (BP Location: Left Arm, Cuff Size: Normal)   Pulse 77   Temp 97.6 ?F (36.4 ?C) (Temporal)   Ht '5\' 6"'$  (1.676 m)   Wt 182 lb 3.2 oz (82.6 kg)   SpO2 96%   BMI 29.41 kg/m?  ? 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 ?Past Surgical History:  ?She  has a past surgical history that includes Appendectomy; PACEMAKER IMPLANT (N/A, 07/23/2018); Colonoscopy; Hysteroscopy with D & C (N/A, 11/05/2018); Insert / replace / remove pacemaker; UTERINE POLYPS REMOVAL; Esophagogastroduodenoscopy (egd) with propofol (N/A, 07/10/2019); Colonoscopy with propofol (N/A, 07/10/2019); Breast cyst aspiration (Bilateral); and Breast cyst excision (Right, 1978). ? ?Brief Summary:  ?Alison Huffman is a 81 y.o. female with obstructive sleep apnea, lung nodule and bronchiectasis. ?  ? ? ? ?Subjective:  ? ?She had CT chest done.  Lung nodules and BTX stable.  Has mild progression of ILD changes.  She has hx of psoriatic arthritis and previously on enbrel.  No hx of other connective tissue diseases.  She has always felt like her breathing was an issue.  Has occasional dry cough. ? ?Uses CPAP nightly.  No issues with mask fit. ? ?Physical Exam:  ? ?Appearance - well kempt  ? ?ENMT - no sinus tenderness, no oral exudate, no LAN, Mallampati 3 airway, no stridor ? ?Respiratory - equal breath sounds bilaterally, no wheezing or rales ? ?CV - s1s2 regular rate and rhythm, no murmurs ? ?Ext - no clubbing, no edema ? ?Skin - no rashes ? ?Psych - normal mood and affect ? ?  ?Chest Imaging:  ?CT chest 06/05/20 >> mild cylindrical BTX  RLL, mild GGO Rt lung base ?CT chest 12/16/20 >> stable nodules, ATX ?CT chest 12/18/20 >> stable BTX RLL, faint GGO with subpleural reticulation in periphery RML and lower lobes, Rt base nodule now 4 mm (was 6 mm), stable RUL nodules 4 mm and 3 mm, calcified granulomas b/l, stable thin wall cyst LUL ? ?Sleep Tests:  ?PSG 01/24/18 >> AHI 24.1, SpO2 low 84.4%; CPAP 8 cm H2O ?Auto CPAP 11/20/21 to 12/19/21 >> used on 30 of 30 nights with average 8 hr 9 min.  Average AHI 1.9 with median CPAP 8 and 95 th percentile CPAP 11 cm H2O ? ?Cardiac Tests:  ?Echo 04/12/20 >> EF 55 to 60%, grade 1 DD, mod LA dilation ? ?Social History:  ?She  reports that she has never smoked. She has never used smokeless tobacco. She reports current alcohol use. She reports that she does not use drugs. ? ?Family History:  ?Her family history includes Atrial fibrillation in her mother; Breast cancer in her maternal aunt, mother, and paternal aunt; Stroke in her father and mother. ?  ? ? ?Assessment/Plan:  ? ?Obstructive sleep apnea. ?- she is compliant with CPAP and reports benefit from therapy ?- gets supplies from [place] ?- continue auto CPAP 5 to 15 cm H2O ? ?Progressive lower lobe predominant GGO with subpleural reticulation. ?- concern she might have early stages of ILD ?- will check lab work and arrange for PFT ?- she will need follow up high  resolution CT chest in September 2023 ?- lung nodules, lung cyst, and mild BTX all stable otherwise ? ?Right lower lobe bronchiectasis. ?- mild and likely post-infectious ?- discussed symptoms to monitor for that would indicate she might need antibiotic therapy ? ?Dyspnea on exertion.  ?- could be related to early ILD ? ?History of Guillain Barre Syndrome. ?- followed by Dr. Narda Amber with [facility] ? ?Second degree heart block s/p pacemaker. ?- followed by Dr. Thompson Grayer with [place] ? ?Time Spent Involved in Patient Care on Day of Examination:  ?38 minutes ? ?Follow up:  ? ?Patient  Instructions  ?Lab tests today ? ?Will schedule pulmonary function test ? ?Follow up in 6 weeks with Dr. Halford Chessman or Nurse Practitioner ? ?Medication List:  ? ?Allergies as of 12/22/2021   ? ?   Reactions  ? Etanercept   ? Guillain Baree  ? Nickel   ? ?  ? ?  ?Medication List  ?  ? ?  ? Accurate as of December 22, 2021 12:19 PM. If you have any questions, ask your nurse or doctor.  ?  ?  ? ?  ? ?acetaminophen 325 MG tablet ?Commonly known as: TYLENOL ?Take 325-650 mg by mouth every 6 (six) hours as needed for mild pain or headache. ?  ?calcium carbonate 750 MG chewable tablet ?Commonly known as: TUMS EX ?Chew by mouth. ?  ?Citrucel oral powder ?Generic drug: methylcellulose ?  ?clobetasol ointment 0.05 % ?Commonly known as: TEMOVATE ?  ?furosemide 20 MG tablet ?Commonly known as: LASIX ?Take 1 tablet (20 mg total) by mouth daily as needed for edema. ?  ?hydrocerin Crea ?Apply 1 application topically 2 (two) times daily. To bilateral feet. ?  ?ketoconazole 2 % cream ?Commonly known as: NIZORAL ?Apply 1 application topically daily. ?  ?losartan 25 MG tablet ?Commonly known as: COZAAR ?Take 1 tablet (25 mg total) by mouth daily. ?  ?polyethylene glycol 17 g packet ?Commonly known as: MIRALAX / GLYCOLAX ?Take 17 g by mouth daily as needed for severe constipation. ?  ?tacrolimus 0.1 % ointment ?Commonly known as: PROTOPIC ?Apply topically 2 (two) times daily as needed (for psoriasis). ?  ?Vitamin D (Ergocalciferol) 50 MCG (2000 UT) Caps ?Take by mouth. ?  ? ?  ? ? ?Signature:  ?Chesley Mires, MD ?[place] ?Pager - [phone] ?12/22/2021, 12:19 PM ?  ? ? ? ? ? ? ? ? ?

## 2021-12-23 LAB — SJOGREN'S SYNDROME ANTIBODS(SSA + SSB)
SSA (Ro) (ENA) Antibody, IgG: <0.2 AI (ref 0.0–0.9)
SSB (La) (ENA) Antibody, IgG: <0.2 AI (ref 0.0–0.9)

## 2021-12-23 LAB — ANTI-JO 1 ANTIBODY, IGG: Anti JO-1: <0.2 AI (ref 0.0–0.9)

## 2021-12-23 LAB — ANTI-SCLERODERMA ANTIBODY: Scleroderma (Scl-70) (ENA) Antibody, IgG: <0.2 AI (ref 0.0–0.9)

## 2021-12-24 LAB — RHEUMATOID FACTOR: Rheumatoid fact SerPl-aCnc: <10 IU/mL (ref <14.0)

## 2021-12-25 LAB — IGE: IgE (Immunoglobulin E), Serum: 32 IU/mL (ref 6–495)

## 2021-12-27 LAB — ANCA TITERS
Atypical P-ANCA titer: <1:20 {titer}
C-ANCA: <1:20 {titer}
P-ANCA: <1:20 {titer}

## 2022-01-10 ENCOUNTER — Ambulatory Visit: Payer: Medicare Other

## 2022-01-10 DIAGNOSIS — I7091 Generalized atherosclerosis: Secondary | ICD-10-CM | POA: Diagnosis not present

## 2022-01-10 DIAGNOSIS — L603 Nail dystrophy: Secondary | ICD-10-CM | POA: Diagnosis not present

## 2022-01-11 ENCOUNTER — Ambulatory Visit (INDEPENDENT_AMBULATORY_CARE_PROVIDER_SITE_OTHER): Payer: Medicare Other

## 2022-01-11 DIAGNOSIS — I441 Atrioventricular block, second degree: Secondary | ICD-10-CM

## 2022-01-11 LAB — CUP PACEART REMOTE DEVICE CHECK
Battery Remaining Longevity: 95 mo
Battery Remaining Percentage: 73 %
Battery Voltage: 3.01 V
Brady Statistic AP VP Percent: 1 %
Brady Statistic AP VS Percent: 8.7 %
Brady Statistic AS VP Percent: 20 %
Brady Statistic AS VS Percent: 70 %
Brady Statistic RA Percent Paced: 7.7 %
Brady Statistic RV Percent Paced: 20 %
Date Time Interrogation Session: 20230412020015
Implantable Lead Implant Date: 20191022
Implantable Lead Implant Date: 20191022
Implantable Lead Location: 753859
Implantable Lead Location: 753860
Implantable Pulse Generator Implant Date: 20191022
Lead Channel Impedance Value: 600 Ohm
Lead Channel Impedance Value: 690 Ohm
Lead Channel Pacing Threshold Amplitude: 0.5 V
Lead Channel Pacing Threshold Amplitude: 0.75 V
Lead Channel Pacing Threshold Pulse Width: 0.5 ms
Lead Channel Pacing Threshold Pulse Width: 0.5 ms
Lead Channel Sensing Intrinsic Amplitude: 12 mV
Lead Channel Sensing Intrinsic Amplitude: 4.4 mV
Lead Channel Setting Pacing Amplitude: 1 V
Lead Channel Setting Pacing Amplitude: 2 V
Lead Channel Setting Pacing Pulse Width: 0.5 ms
Lead Channel Setting Sensing Sensitivity: 2 mV
Pulse Gen Model: 2272
Pulse Gen Serial Number: 9073848

## 2022-01-13 ENCOUNTER — Other Ambulatory Visit: Payer: Self-pay | Admitting: Family Medicine

## 2022-01-13 ENCOUNTER — Ambulatory Visit (INDEPENDENT_AMBULATORY_CARE_PROVIDER_SITE_OTHER): Payer: Medicare Other | Admitting: Family Medicine

## 2022-01-13 ENCOUNTER — Encounter: Payer: Self-pay | Admitting: Family Medicine

## 2022-01-13 VITALS — BP 140/70 | HR 72 | Temp 98.2°F | Ht 66.0 in | Wt 182.2 lb

## 2022-01-13 DIAGNOSIS — M79662 Pain in left lower leg: Secondary | ICD-10-CM | POA: Diagnosis not present

## 2022-01-13 DIAGNOSIS — R0609 Other forms of dyspnea: Secondary | ICD-10-CM

## 2022-01-13 DIAGNOSIS — M7989 Other specified soft tissue disorders: Secondary | ICD-10-CM

## 2022-01-13 DIAGNOSIS — I10 Essential (primary) hypertension: Secondary | ICD-10-CM | POA: Diagnosis not present

## 2022-01-13 LAB — D-DIMER, QUANTITATIVE: D-Dimer, Quant: 0.66 mcg/mL FEU — ABNORMAL HIGH (ref <0.50)

## 2022-01-13 NOTE — Assessment & Plan Note (Signed)
This is a chronic ongoing issue.  Possibly early interstitial lung disease could be playing a role based on evaluation with pulmonology.  She will have the PFTs done when they are able to do them.  I also discussed that this could certainly be related to deconditioning.  She will monitor at this time. ?

## 2022-01-13 NOTE — Patient Instructions (Signed)
Nice to see you. ?We will contact you with your lab result. ?If you happen to develop chest pain or worsening shortness of breath please seek medical attention. ?

## 2022-01-13 NOTE — Assessment & Plan Note (Signed)
Well-controlled.  She will continue losartan 25 mg once daily. ?

## 2022-01-13 NOTE — Assessment & Plan Note (Signed)
I suspect the patient's pain is related to Baker's cyst and a musculoskeletal issue at her hamstring tendon though given the pain and swelling we do need to evaluate for a DVT.  Discussed the potential for doing an ultrasound versus D-dimer.  Discussed that given the low likelihood of a DVT we could do the D-dimer and if it was negative this would fully rule out DVT.  Discussed if it was positive she would have to have an ultrasound.  Advised to seek medical attention if she develops chest pain or worsening shortness of breath. ?

## 2022-01-13 NOTE — Progress Notes (Signed)
?Tommi Rumps, MD ?Phone: [phone] ? ?Alison Huffman is a 81 y.o. female who presents today for f/u. ? ?HYPERTENSION ?Disease Monitoring ?Home BP Monitoring 130s/60s generally Chest pain- no    Dyspnea- no change to chronic dyspnea ?Medications ?Compliance-  taking losartan.   Edema- see below ?BMET ?   ?Component Value Date/Time  ? NA 140 12/22/2021 1256  ? NA 142 05/23/2021 1210  ? K 3.9 12/22/2021 1256  ? CL 105 12/22/2021 1256  ? CO2 27 12/22/2021 1256  ? GLUCOSE 93 12/22/2021 1256  ? BUN 11 12/22/2021 1256  ? BUN 13 05/23/2021 1210  ? CREATININE 0.56 12/22/2021 1256  ? CALCIUM 9.3 12/22/2021 1256  ? GFRNONAA >60 12/22/2021 1256  ? GFRAA >60 05/03/2020 0513  ? ?Chronic dyspnea: This has been an ongoing issue.  She has seen pulmonology and cardiology.  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  They arrange for PFTs though those have been delayed as the machine broke.  She is awaiting their call back to get this rescheduled. ? ?Left leg pain and swelling: This has been going on a few weeks.  It started behind her left knee and then radiated down towards her foot.  She notes no injury.  No travel before this started.  No recent surgeries.  No history of blood clots.  She notes Aleve did help some yesterday.  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 ? ?Social History  ? ?Tobacco Use  ?Smoking Status Never  ?Smokeless Tobacco Never  ?Tobacco Comments  ? 07/22/2018 "smoked some in college; 1960s;  nothing since"  ? ? ?Current Outpatient Medications on File Prior to Visit  ?Medication Sig Dispense Refill  ? acetaminophen (TYLENOL) 325 MG tablet Take 325-650 mg by mouth every 6 (six) hours as needed for mild pain or headache.     ? clobetasol ointment (TEMOVATE) 0.05 %     ? furosemide (LASIX) 20 MG tablet Take 1 tablet (20 mg total) by mouth daily as needed for edema. 30 tablet 3  ? hydrocerin (EUCERIN) CREA Apply 1 application  topically 2 (two) times daily. To bilateral feet.  0  ? ketoconazole (NIZORAL) 2 % cream Apply 1 application topically daily. 15 g 0  ? losartan (COZAAR) 25 MG tablet Take 1 tablet (25 mg total) by mouth daily. 90 tablet 3  ? methylcellulose (CITRUCEL) oral powder     ? polyethylene glycol (MIRALAX / GLYCOLAX) 17 g packet Take 17 g by mouth daily as needed for severe constipation. 14 each 0  ? tacrolimus (PROTOPIC) 0.1 % ointment Apply topically 2 (two) times daily as needed (for psoriasis).     ? Vitamin D, Ergocalciferol, 50 MCG (2000 UT) CAPS Take by mouth.    ? ?No current facility-administered medications on file prior to visit.  ? ? ? ?ROS see history of present illness ? ?Objective ? ?Physical Exam ?Vitals:  ? 01/13/22 0857  ?BP: 140/70  ?Pulse: 72  ?Temp: 98.2 ?F (36.8 ?C)  ?SpO2: 96%  ? ? ?BP Readings from Last 3 Encounters:  ?01/13/22 140/70  ?12/22/21 136/74  ?12/01/21 120/70  ? ?Wt Readings from Last 3 Encounters:  ?01/13/22 182 lb 3.2 oz (82.6 kg)  ?12/22/21 182 lb 3.2 oz (82.6 kg)  ?12/01/21 181 lb 6.4 oz (82.3 kg)  ? ? ?Physical Exam ?Constitutional:   ?   General: She is not in acute distress. ?   Appearance: She  is not diaphoretic.  ?Cardiovascular:  ?   Rate and Rhythm: Normal rate and regular rhythm.  ?   Heart sounds: Normal heart sounds.  ?Pulmonary:  ?   Effort: Pulmonary effort is normal.  ?   Breath sounds: Normal breath sounds.  ?Musculoskeletal:  ?   Comments: Right leg with no swelling or tenderness, left leg with a soft tissue fullness behind the left knee, slight tenderness in this area, slight tenderness over the left medial hamstring tendon insertion, no tenderness of her left calf, there are varicose veins that do not appear irritated in the left lower extremity  ?Skin: ?   General: Skin is warm and dry.  ?Neurological:  ?   Mental Status: She is alert.  ? ? ? ?Assessment/Plan: Please see individual problem list. ? ?Problem List Items Addressed This Visit   ? ? Dyspnea on exertion  (Chronic)  ?  This is a chronic ongoing issue.  Possibly early interstitial lung disease could be playing a role based on evaluation with pulmonology.  She will have the PFTs done when they are able to do them.  I also discussed that this could certainly be related to deconditioning.  She will monitor at this time. ? ?  ?  ? Hypertension (Chronic)  ?  Well-controlled.  She will continue losartan 25 mg once daily. ? ?  ?  ? Pain and swelling of left lower leg - Primary  ?  I suspect the patient's pain is related to Baker's cyst and a musculoskeletal issue at her hamstring tendon though given the pain and swelling we do need to evaluate for a DVT.  Discussed the potential for doing an ultrasound versus D-dimer.  Discussed that given the low likelihood of a DVT we could do the D-dimer and if it was negative this would fully rule out DVT.  Discussed if it was positive she would have to have an ultrasound.  Advised to seek medical attention if she develops chest pain or worsening shortness of breath. ? ?  ?  ? Relevant Orders  ? D-Dimer, Quantitative  ? ? ?Return in about 6 months (around 07/15/2022) for HTN. ? ?This visit occurred during the SARS-CoV-2 public health emergency.  Safety protocols were in place, including screening questions prior to the visit, additional usage of staff PPE, and extensive cleaning of exam room while observing appropriate contact time as indicated for disinfecting solutions.  ? ? ?Tommi Rumps, MD ?[place] ? ?

## 2022-01-14 IMAGING — CT CT CHEST W/ CM
2 of 4 series · 15 of 36 positions shown, 18 images · IV contrast (omnipaque)
Comparison: Chest radiograph April 16, 2020

CLINICAL DATA: Patient was on a ventilator until 04/11/2020 and she
is complaining of persistent hoarseness since then.

EXAM:
CT CHEST WITH CONTRAST
TECHNIQUE: Multidetector CT imaging of the chest was performed during
intravenous contrast administration.
CONTRAST:  75mL OMNIPAQUE IOHEXOL 300 MG/ML  SOLN

[Series 6: lungs cap with 2.00 ax · axial · 0.61mm/px · z∈[-978,-686]mm · 12 of 164 slices shown, 15 images]
[im 9/164  mediastinal]
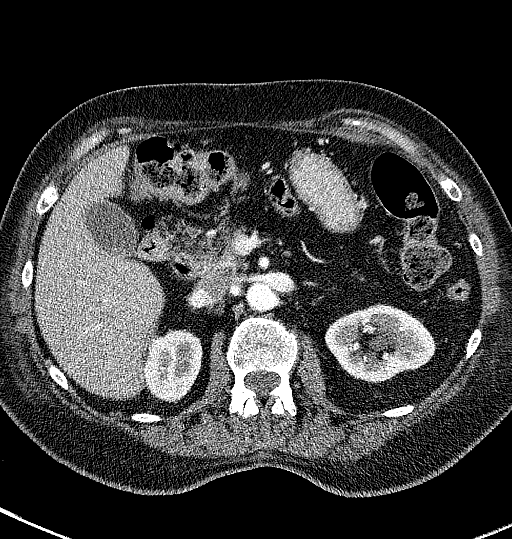
[im 9/164  lung]
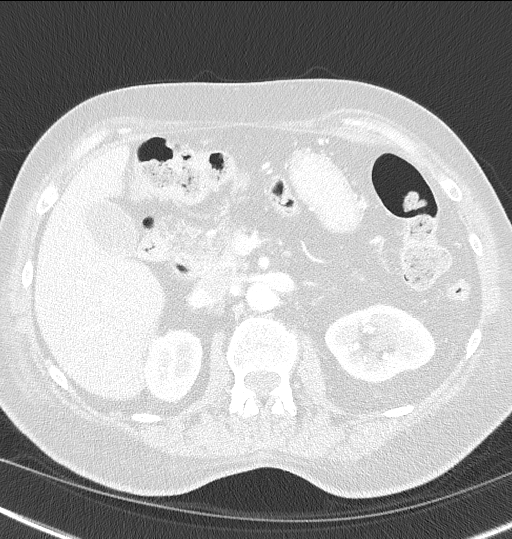
[im 26/164  lung]
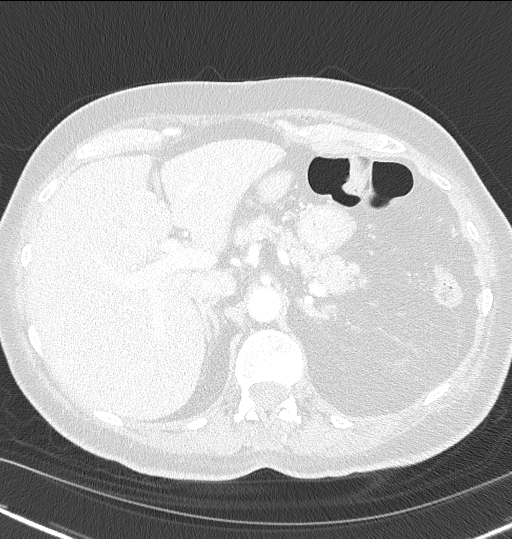
[im 35/164  lung]
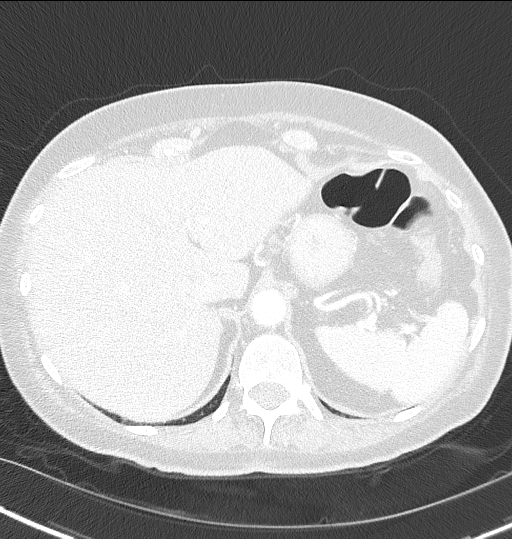
[im 52/164  lung]
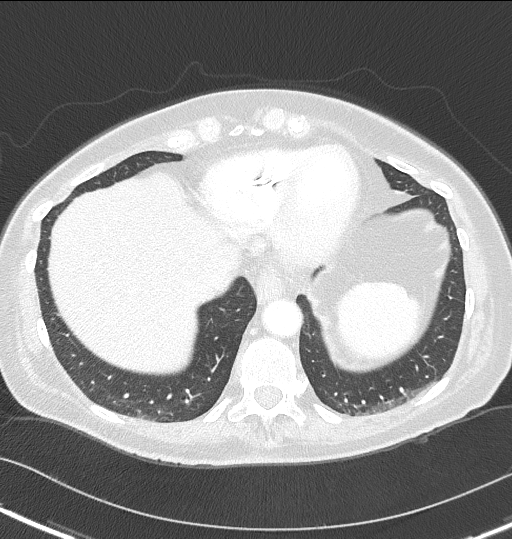
[im 61/164  mediastinal]
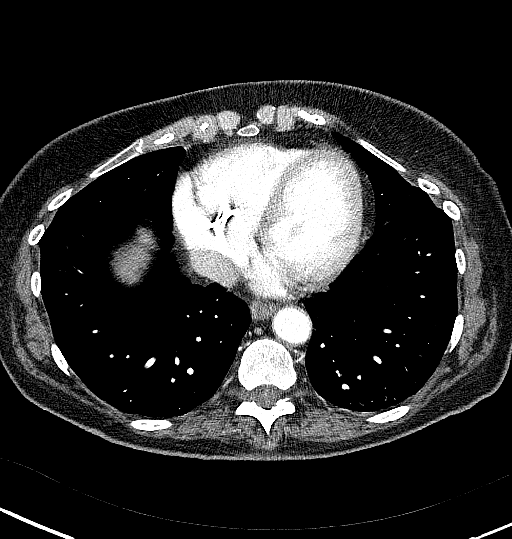
[im 61/164  lung]
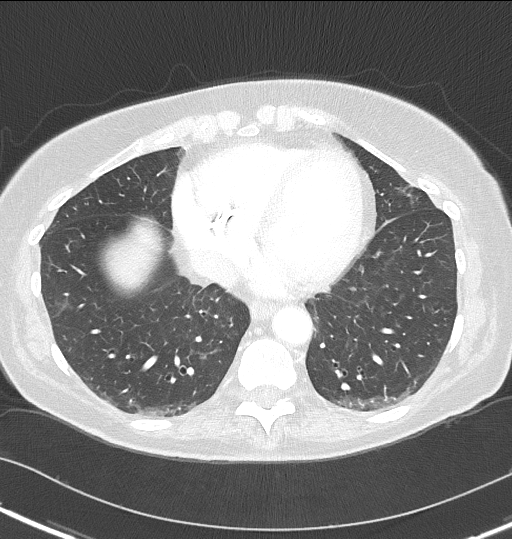
[im 78/164  lung]
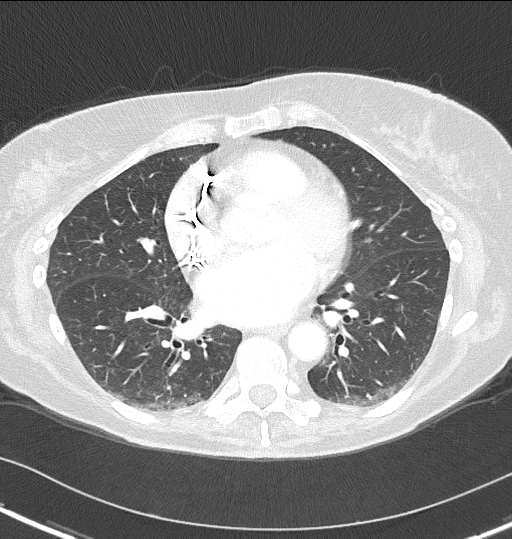
[im 86/164  lung]
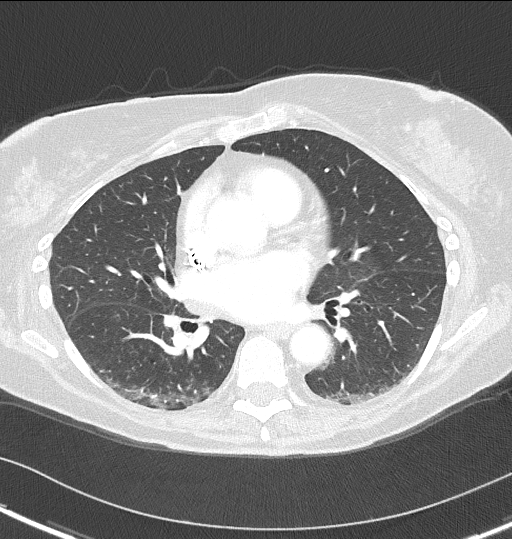
[im 103/164  lung]
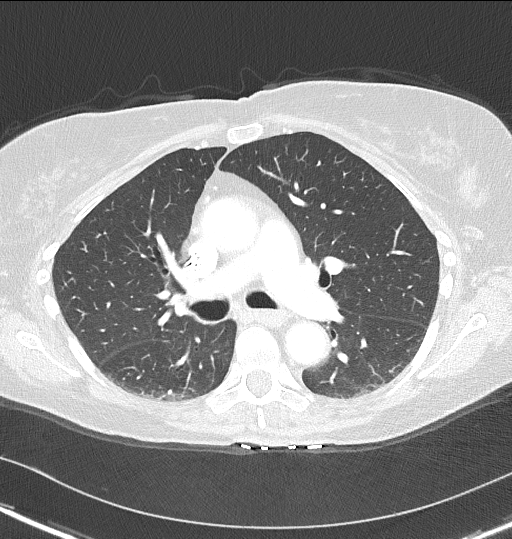
[im 112/164  mediastinal]
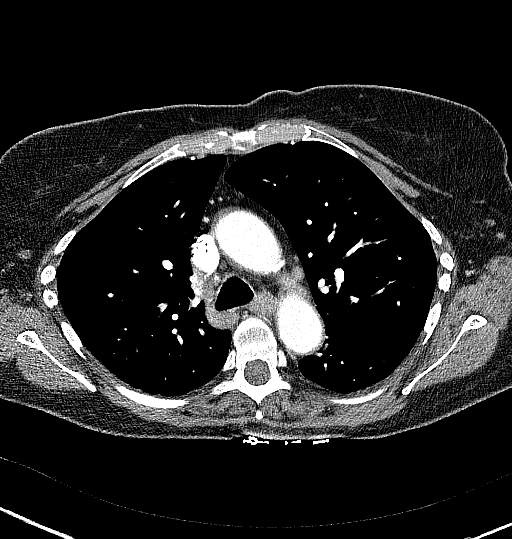
[im 112/164  lung]
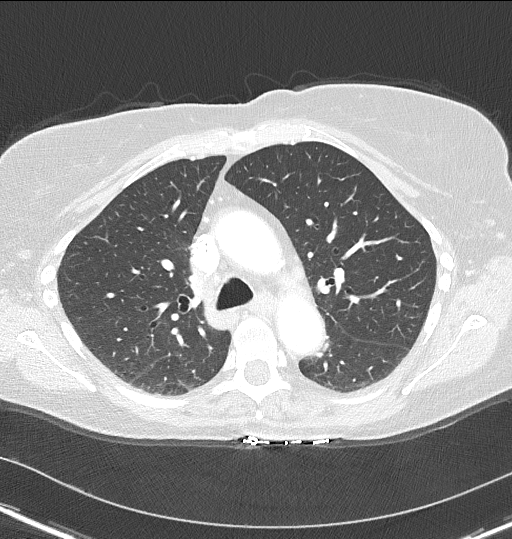
[im 129/164  lung]
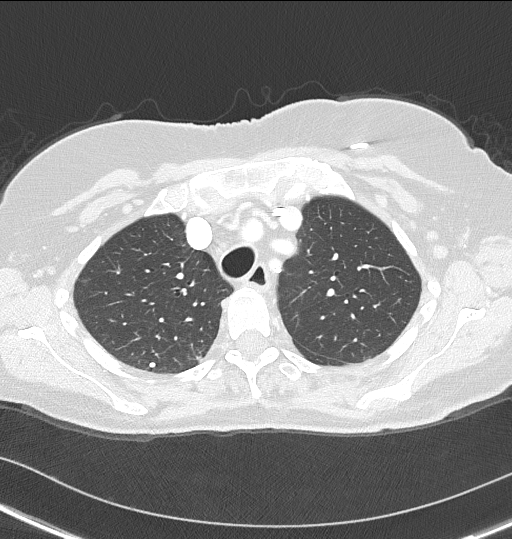
[im 138/164  lung]
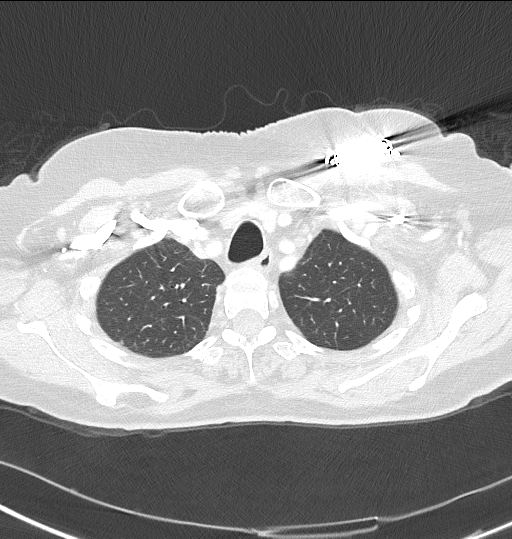
[im 155/164  lung]
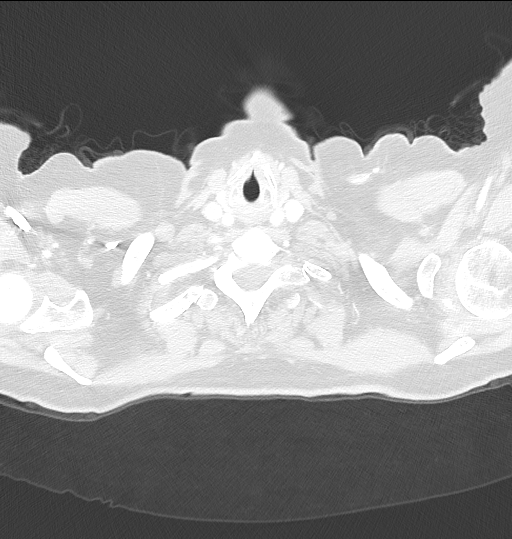

[Series 8: coronal cap with 2.00 cor · coronal · 0.61mm/px · 3 of 122 slices shown]
[im 25/122  lung]
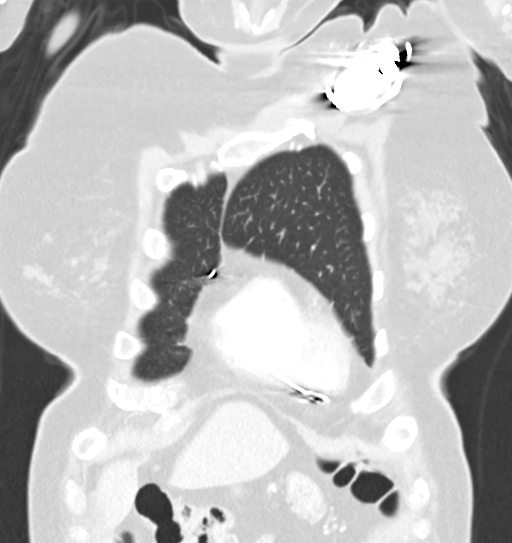
[im 49/122  lung]
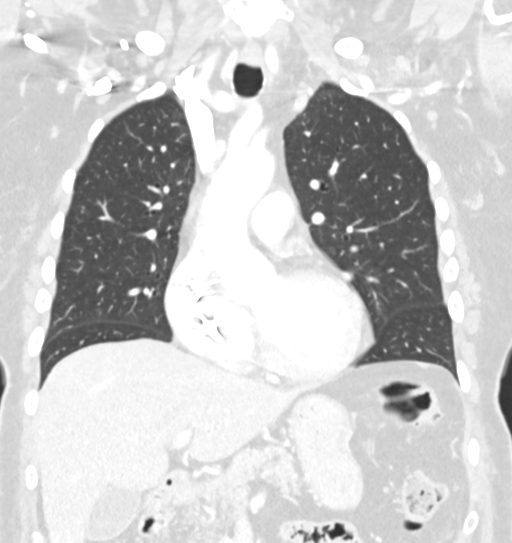
[im 73/122  lung]
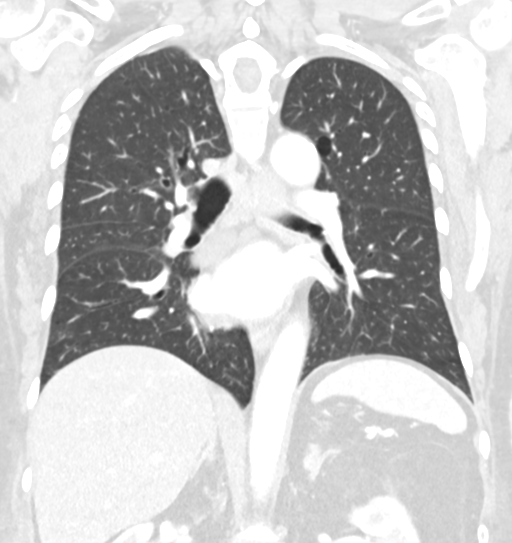

[15 of 36 positions shown; findings below may reference images not displayed]

FINDINGS: Cardiovascular: Mildly enlarged heart. Calcific atherosclerotic
disease of the aorta. Dual lead pacemaker.

Mediastinum/Nodes: No enlarged mediastinal, hilar, or axillary lymph
nodes. Thyroid gland, trachea, and esophagus demonstrate no
significant findings.

Lungs/Pleura: Mild cylindrical bronchiectasis in the right lower
lobe. Mild ground-glass opacities in the right lung base. Bilateral
lower lobe dependent subpleural scarring versus atelectasis.

Upper Abdomen: No acute abnormality.

Musculoskeletal: No chest wall abnormality. No acute or significant
osseous findings.
IMPRESSION: 1. Mild cylindrical bronchiectasis in the right lower lobe.
2. Small area of ground-glass opacities in the right lung base,
nonspecific, may be post infectious or post inflammatory.
3. Bilateral lower lobe dependent subpleural scarring versus
atelectasis.
4. Mildly enlarged heart.
5. Aortic atherosclerosis.

Aortic Atherosclerosis ([id number]).

## 2022-01-16 ENCOUNTER — Ambulatory Visit
Admission: RE | Admit: 2022-01-16 | Discharge: 2022-01-16 | Disposition: A | Discharge status: Home / Self Care | Payer: Medicare Other | Source: Ambulatory Visit | Attending: Family Medicine | Admitting: Family Medicine

## 2022-01-16 ENCOUNTER — Telehealth: Payer: Self-pay | Admitting: Family Medicine

## 2022-01-16 DIAGNOSIS — M79662 Pain in left lower leg: Secondary | ICD-10-CM | POA: Insufficient documentation

## 2022-01-16 DIAGNOSIS — M7989 Other specified soft tissue disorders: Secondary | ICD-10-CM | POA: Diagnosis not present

## 2022-01-16 DIAGNOSIS — M7122 Synovial cyst of popliteal space [Baker], left knee: Secondary | ICD-10-CM | POA: Diagnosis not present

## 2022-01-16 DIAGNOSIS — R600 Localized edema: Secondary | ICD-10-CM | POA: Diagnosis not present

## 2022-01-16 NOTE — Telephone Encounter (Signed)
Pt called in stating that she was suppose to be schedule for an ultrasound... Pt stated that Dr. Caryl Bis think she may have a blood clot... Pt stated that she haven't heard anything from anyone... Pt requesting callback as soon as possible...  ?

## 2022-01-16 NOTE — Telephone Encounter (Signed)
Pt called about ultrasound scheduling. Pt would like call back ASAP ?

## 2022-01-17 DIAGNOSIS — Z20822 Contact with and (suspected) exposure to covid-19: Secondary | ICD-10-CM | POA: Diagnosis not present

## 2022-01-18 NOTE — Addendum Note (Signed)
Addended by: Leone Haven on: 01/18/2022 02:49 PM ? ? Modules accepted: Orders ? ?

## 2022-01-18 NOTE — Progress Notes (Signed)
Negative [place] for DVT. Refer to ortho for bakers cyst and pain.  ?

## 2022-01-20 DIAGNOSIS — M1712 Unilateral primary osteoarthritis, left knee: Secondary | ICD-10-CM | POA: Diagnosis not present

## 2022-01-24 DIAGNOSIS — M6281 Muscle weakness (generalized): Secondary | ICD-10-CM | POA: Diagnosis not present

## 2022-01-24 DIAGNOSIS — M25562 Pain in left knee: Secondary | ICD-10-CM | POA: Diagnosis not present

## 2022-01-24 DIAGNOSIS — H47011 Ischemic optic neuropathy, right eye: Secondary | ICD-10-CM | POA: Diagnosis not present

## 2022-01-24 DIAGNOSIS — R2689 Other abnormalities of gait and mobility: Secondary | ICD-10-CM | POA: Diagnosis not present

## 2022-01-24 DIAGNOSIS — H2513 Age-related nuclear cataract, bilateral: Secondary | ICD-10-CM | POA: Diagnosis not present

## 2022-01-27 DIAGNOSIS — R2689 Other abnormalities of gait and mobility: Secondary | ICD-10-CM | POA: Diagnosis not present

## 2022-01-27 DIAGNOSIS — M6281 Muscle weakness (generalized): Secondary | ICD-10-CM | POA: Diagnosis not present

## 2022-01-27 DIAGNOSIS — M25562 Pain in left knee: Secondary | ICD-10-CM | POA: Diagnosis not present

## 2022-01-27 NOTE — Progress Notes (Signed)
Remote pacemaker transmission.   

## 2022-01-30 DIAGNOSIS — M25562 Pain in left knee: Secondary | ICD-10-CM | POA: Diagnosis not present

## 2022-01-30 DIAGNOSIS — R2689 Other abnormalities of gait and mobility: Secondary | ICD-10-CM | POA: Diagnosis not present

## 2022-01-30 DIAGNOSIS — M6281 Muscle weakness (generalized): Secondary | ICD-10-CM | POA: Diagnosis not present

## 2022-02-03 ENCOUNTER — Ambulatory Visit: Payer: Medicare Other | Admitting: Primary Care

## 2022-02-03 DIAGNOSIS — M1712 Unilateral primary osteoarthritis, left knee: Secondary | ICD-10-CM | POA: Diagnosis not present

## 2022-02-07 DIAGNOSIS — R2689 Other abnormalities of gait and mobility: Secondary | ICD-10-CM | POA: Diagnosis not present

## 2022-02-07 DIAGNOSIS — M25562 Pain in left knee: Secondary | ICD-10-CM | POA: Diagnosis not present

## 2022-02-07 DIAGNOSIS — M6281 Muscle weakness (generalized): Secondary | ICD-10-CM | POA: Diagnosis not present

## 2022-02-10 DIAGNOSIS — M6281 Muscle weakness (generalized): Secondary | ICD-10-CM | POA: Diagnosis not present

## 2022-02-10 DIAGNOSIS — M25562 Pain in left knee: Secondary | ICD-10-CM | POA: Diagnosis not present

## 2022-02-10 DIAGNOSIS — R2689 Other abnormalities of gait and mobility: Secondary | ICD-10-CM | POA: Diagnosis not present

## 2022-02-13 DIAGNOSIS — K5909 Other constipation: Secondary | ICD-10-CM | POA: Diagnosis not present

## 2022-02-13 DIAGNOSIS — K2970 Gastritis, unspecified, without bleeding: Secondary | ICD-10-CM | POA: Diagnosis not present

## 2022-02-13 DIAGNOSIS — K219 Gastro-esophageal reflux disease without esophagitis: Secondary | ICD-10-CM | POA: Diagnosis not present

## 2022-02-13 DIAGNOSIS — K449 Diaphragmatic hernia without obstruction or gangrene: Secondary | ICD-10-CM | POA: Diagnosis not present

## 2022-02-13 DIAGNOSIS — K828 Other specified diseases of gallbladder: Secondary | ICD-10-CM | POA: Diagnosis not present

## 2022-02-14 DIAGNOSIS — M6281 Muscle weakness (generalized): Secondary | ICD-10-CM | POA: Diagnosis not present

## 2022-02-14 DIAGNOSIS — M25562 Pain in left knee: Secondary | ICD-10-CM | POA: Diagnosis not present

## 2022-02-14 DIAGNOSIS — R2689 Other abnormalities of gait and mobility: Secondary | ICD-10-CM | POA: Diagnosis not present

## 2022-02-16 DIAGNOSIS — M25562 Pain in left knee: Secondary | ICD-10-CM | POA: Diagnosis not present

## 2022-02-16 DIAGNOSIS — R2689 Other abnormalities of gait and mobility: Secondary | ICD-10-CM | POA: Diagnosis not present

## 2022-02-16 DIAGNOSIS — M6281 Muscle weakness (generalized): Secondary | ICD-10-CM | POA: Diagnosis not present

## 2022-02-21 DIAGNOSIS — R2689 Other abnormalities of gait and mobility: Secondary | ICD-10-CM | POA: Diagnosis not present

## 2022-02-21 DIAGNOSIS — M25562 Pain in left knee: Secondary | ICD-10-CM | POA: Diagnosis not present

## 2022-02-21 DIAGNOSIS — M6281 Muscle weakness (generalized): Secondary | ICD-10-CM | POA: Diagnosis not present

## 2022-02-22 ENCOUNTER — Ambulatory Visit: Payer: Medicare Other | Attending: Pulmonary Disease

## 2022-02-22 DIAGNOSIS — R0602 Shortness of breath: Secondary | ICD-10-CM | POA: Diagnosis not present

## 2022-02-22 DIAGNOSIS — G650 Sequelae of Guillain-Barre syndrome: Secondary | ICD-10-CM | POA: Diagnosis not present

## 2022-02-22 LAB — PULMONARY FUNCTION TEST ARMC ONLY
DL/VA % pred: 89 %
DL/VA: 3.58 ml/min/mmHg/L
DLCO unc % pred: 66 %
DLCO unc: 13.36 ml/min/mmHg
FEF 25-75 Post: 2.53 L/sec
FEF 25-75 Pre: 2.39 L/sec
FEF2575-%Change-Post: 5 %
FEF2575-%Pred-Post: 177 %
FEF2575-%Pred-Pre: 168 %
FEV1-%Change-Post: 1 %
FEV1-%Pred-Post: 103 %
FEV1-%Pred-Pre: 101 %
FEV1-Post: 2.14 L
FEV1-Pre: 2.1 L
FEV1FVC-%Change-Post: 4 %
FEV1FVC-%Pred-Pre: 118 %
FEV6-%Change-Post: -2 %
FEV6-%Pred-Post: 89 %
FEV6-%Pred-Pre: 92 %
FEV6-Post: 2.36 L
FEV6-Pre: 2.42 L
FEV6FVC-%Pred-Post: 105 %
FEV6FVC-%Pred-Pre: 105 %
FVC-%Change-Post: -2 %
FVC-%Pred-Post: 85 %
FVC-%Pred-Pre: 87 %
FVC-Post: 2.36 L
FVC-Pre: 2.42 L
Post FEV1/FVC ratio: 91 %
Post FEV6/FVC ratio: 100 %
Pre FEV1/FVC ratio: 87 %
Pre FEV6/FVC Ratio: 100 %
RV % pred: 60 %
RV: 1.52 L
TLC % pred: 73 %
TLC: 3.93 L

## 2022-02-22 MED ORDER — ALBUTEROL SULFATE (2.5 MG/3ML) 0.083% IN NEBU
2.5000 mg | INHALATION_SOLUTION | Freq: Once | RESPIRATORY_TRACT | Status: AC
  Administered 2022-02-22: 2.5 mg via RESPIRATORY_TRACT
  Filled 2022-02-22: qty 3

## 2022-02-23 DIAGNOSIS — R2689 Other abnormalities of gait and mobility: Secondary | ICD-10-CM | POA: Diagnosis not present

## 2022-02-23 DIAGNOSIS — M25562 Pain in left knee: Secondary | ICD-10-CM | POA: Diagnosis not present

## 2022-02-23 DIAGNOSIS — M6281 Muscle weakness (generalized): Secondary | ICD-10-CM | POA: Diagnosis not present

## 2022-02-28 DIAGNOSIS — R2689 Other abnormalities of gait and mobility: Secondary | ICD-10-CM | POA: Diagnosis not present

## 2022-02-28 DIAGNOSIS — M6281 Muscle weakness (generalized): Secondary | ICD-10-CM | POA: Diagnosis not present

## 2022-02-28 DIAGNOSIS — M25562 Pain in left knee: Secondary | ICD-10-CM | POA: Diagnosis not present

## 2022-03-02 DIAGNOSIS — H0100B Unspecified blepharitis left eye, upper and lower eyelids: Secondary | ICD-10-CM | POA: Diagnosis not present

## 2022-03-02 DIAGNOSIS — R2689 Other abnormalities of gait and mobility: Secondary | ICD-10-CM | POA: Diagnosis not present

## 2022-03-02 DIAGNOSIS — M25562 Pain in left knee: Secondary | ICD-10-CM | POA: Diagnosis not present

## 2022-03-02 DIAGNOSIS — H0014 Chalazion left upper eyelid: Secondary | ICD-10-CM | POA: Diagnosis not present

## 2022-03-02 DIAGNOSIS — H0100A Unspecified blepharitis right eye, upper and lower eyelids: Secondary | ICD-10-CM | POA: Diagnosis not present

## 2022-03-02 DIAGNOSIS — M6281 Muscle weakness (generalized): Secondary | ICD-10-CM | POA: Diagnosis not present

## 2022-03-07 DIAGNOSIS — M6281 Muscle weakness (generalized): Secondary | ICD-10-CM | POA: Diagnosis not present

## 2022-03-07 DIAGNOSIS — R2689 Other abnormalities of gait and mobility: Secondary | ICD-10-CM | POA: Diagnosis not present

## 2022-03-07 DIAGNOSIS — M25562 Pain in left knee: Secondary | ICD-10-CM | POA: Diagnosis not present

## 2022-03-09 DIAGNOSIS — M25562 Pain in left knee: Secondary | ICD-10-CM | POA: Diagnosis not present

## 2022-03-09 DIAGNOSIS — M6281 Muscle weakness (generalized): Secondary | ICD-10-CM | POA: Diagnosis not present

## 2022-03-09 DIAGNOSIS — R2689 Other abnormalities of gait and mobility: Secondary | ICD-10-CM | POA: Diagnosis not present

## 2022-03-14 DIAGNOSIS — M6281 Muscle weakness (generalized): Secondary | ICD-10-CM | POA: Diagnosis not present

## 2022-03-14 DIAGNOSIS — R2689 Other abnormalities of gait and mobility: Secondary | ICD-10-CM | POA: Diagnosis not present

## 2022-03-14 DIAGNOSIS — M25562 Pain in left knee: Secondary | ICD-10-CM | POA: Diagnosis not present

## 2022-03-16 DIAGNOSIS — M25562 Pain in left knee: Secondary | ICD-10-CM | POA: Diagnosis not present

## 2022-03-16 DIAGNOSIS — R2689 Other abnormalities of gait and mobility: Secondary | ICD-10-CM | POA: Diagnosis not present

## 2022-03-16 DIAGNOSIS — M6281 Muscle weakness (generalized): Secondary | ICD-10-CM | POA: Diagnosis not present

## 2022-03-21 DIAGNOSIS — R2689 Other abnormalities of gait and mobility: Secondary | ICD-10-CM | POA: Diagnosis not present

## 2022-03-21 DIAGNOSIS — M6281 Muscle weakness (generalized): Secondary | ICD-10-CM | POA: Diagnosis not present

## 2022-03-21 DIAGNOSIS — M25562 Pain in left knee: Secondary | ICD-10-CM | POA: Diagnosis not present

## 2022-03-23 DIAGNOSIS — M25562 Pain in left knee: Secondary | ICD-10-CM | POA: Diagnosis not present

## 2022-03-23 DIAGNOSIS — M6281 Muscle weakness (generalized): Secondary | ICD-10-CM | POA: Diagnosis not present

## 2022-03-23 DIAGNOSIS — R2689 Other abnormalities of gait and mobility: Secondary | ICD-10-CM | POA: Diagnosis not present

## 2022-03-24 ENCOUNTER — Encounter: Payer: Self-pay | Admitting: Adult Health

## 2022-03-24 ENCOUNTER — Ambulatory Visit (INDEPENDENT_AMBULATORY_CARE_PROVIDER_SITE_OTHER): Payer: Medicare Other | Admitting: Adult Health

## 2022-03-24 VITALS — BP 120/60 | HR 87 | Temp 97.8°F | Ht 66.0 in | Wt 181.2 lb

## 2022-03-24 DIAGNOSIS — R0602 Shortness of breath: Secondary | ICD-10-CM

## 2022-03-24 DIAGNOSIS — G4733 Obstructive sleep apnea (adult) (pediatric): Secondary | ICD-10-CM | POA: Diagnosis not present

## 2022-03-24 DIAGNOSIS — J849 Interstitial pulmonary disease, unspecified: Secondary | ICD-10-CM | POA: Diagnosis not present

## 2022-03-24 DIAGNOSIS — J479 Bronchiectasis, uncomplicated: Secondary | ICD-10-CM | POA: Diagnosis not present

## 2022-03-24 NOTE — Progress Notes (Signed)
f °

## 2022-03-28 DIAGNOSIS — R2689 Other abnormalities of gait and mobility: Secondary | ICD-10-CM | POA: Diagnosis not present

## 2022-03-28 DIAGNOSIS — M25562 Pain in left knee: Secondary | ICD-10-CM | POA: Diagnosis not present

## 2022-03-28 DIAGNOSIS — M6281 Muscle weakness (generalized): Secondary | ICD-10-CM | POA: Diagnosis not present

## 2022-03-31 DIAGNOSIS — M6281 Muscle weakness (generalized): Secondary | ICD-10-CM | POA: Diagnosis not present

## 2022-03-31 DIAGNOSIS — M25562 Pain in left knee: Secondary | ICD-10-CM | POA: Diagnosis not present

## 2022-03-31 DIAGNOSIS — R2689 Other abnormalities of gait and mobility: Secondary | ICD-10-CM | POA: Diagnosis not present

## 2022-04-06 DIAGNOSIS — M6281 Muscle weakness (generalized): Secondary | ICD-10-CM | POA: Diagnosis not present

## 2022-04-06 DIAGNOSIS — R2689 Other abnormalities of gait and mobility: Secondary | ICD-10-CM | POA: Diagnosis not present

## 2022-04-06 DIAGNOSIS — M25562 Pain in left knee: Secondary | ICD-10-CM | POA: Diagnosis not present

## 2022-04-11 DIAGNOSIS — R2689 Other abnormalities of gait and mobility: Secondary | ICD-10-CM | POA: Diagnosis not present

## 2022-04-11 DIAGNOSIS — M25562 Pain in left knee: Secondary | ICD-10-CM | POA: Diagnosis not present

## 2022-04-11 DIAGNOSIS — M6281 Muscle weakness (generalized): Secondary | ICD-10-CM | POA: Diagnosis not present

## 2022-04-12 ENCOUNTER — Ambulatory Visit (INDEPENDENT_AMBULATORY_CARE_PROVIDER_SITE_OTHER): Payer: Medicare Other

## 2022-04-12 DIAGNOSIS — I441 Atrioventricular block, second degree: Secondary | ICD-10-CM | POA: Diagnosis not present

## 2022-04-12 LAB — CUP PACEART REMOTE DEVICE CHECK
Battery Remaining Longevity: 92 mo
Battery Remaining Percentage: 70 %
Battery Voltage: 2.99 V
Brady Statistic AP VP Percent: 1.2 %
Brady Statistic AP VS Percent: 7.4 %
Brady Statistic AS VP Percent: 23 %
Brady Statistic AS VS Percent: 67 %
Brady Statistic RA Percent Paced: 6.6 %
Brady Statistic RV Percent Paced: 25 %
Date Time Interrogation Session: 20230712020013
Implantable Lead Implant Date: 20191022
Implantable Lead Implant Date: 20191022
Implantable Lead Location: 753859
Implantable Lead Location: 753860
Implantable Pulse Generator Implant Date: 20191022
Lead Channel Impedance Value: 580 Ohm
Lead Channel Impedance Value: 730 Ohm
Lead Channel Pacing Threshold Amplitude: 0.5 V
Lead Channel Pacing Threshold Amplitude: 0.625 V
Lead Channel Pacing Threshold Pulse Width: 0.5 ms
Lead Channel Pacing Threshold Pulse Width: 0.5 ms
Lead Channel Sensing Intrinsic Amplitude: 12 mV
Lead Channel Sensing Intrinsic Amplitude: 3.8 mV
Lead Channel Setting Pacing Amplitude: 0.875
Lead Channel Setting Pacing Amplitude: 2 V
Lead Channel Setting Pacing Pulse Width: 0.5 ms
Lead Channel Setting Sensing Sensitivity: 2 mV
Pulse Gen Model: 2272
Pulse Gen Serial Number: 9073848

## 2022-04-14 DIAGNOSIS — M6281 Muscle weakness (generalized): Secondary | ICD-10-CM | POA: Diagnosis not present

## 2022-04-14 DIAGNOSIS — M25562 Pain in left knee: Secondary | ICD-10-CM | POA: Diagnosis not present

## 2022-04-14 DIAGNOSIS — R2689 Other abnormalities of gait and mobility: Secondary | ICD-10-CM | POA: Diagnosis not present

## 2022-04-17 ENCOUNTER — Other Ambulatory Visit: Payer: Self-pay | Admitting: Family Medicine

## 2022-04-17 DIAGNOSIS — I10 Essential (primary) hypertension: Secondary | ICD-10-CM

## 2022-04-19 DIAGNOSIS — M25562 Pain in left knee: Secondary | ICD-10-CM | POA: Diagnosis not present

## 2022-04-19 DIAGNOSIS — M6281 Muscle weakness (generalized): Secondary | ICD-10-CM | POA: Diagnosis not present

## 2022-04-19 DIAGNOSIS — R2689 Other abnormalities of gait and mobility: Secondary | ICD-10-CM | POA: Diagnosis not present

## 2022-04-20 DIAGNOSIS — M6281 Muscle weakness (generalized): Secondary | ICD-10-CM | POA: Diagnosis not present

## 2022-04-20 DIAGNOSIS — M25562 Pain in left knee: Secondary | ICD-10-CM | POA: Diagnosis not present

## 2022-04-20 DIAGNOSIS — R2689 Other abnormalities of gait and mobility: Secondary | ICD-10-CM | POA: Diagnosis not present

## 2022-04-25 DIAGNOSIS — I7091 Generalized atherosclerosis: Secondary | ICD-10-CM | POA: Diagnosis not present

## 2022-04-25 DIAGNOSIS — M6281 Muscle weakness (generalized): Secondary | ICD-10-CM | POA: Diagnosis not present

## 2022-04-25 DIAGNOSIS — M25562 Pain in left knee: Secondary | ICD-10-CM | POA: Diagnosis not present

## 2022-04-25 DIAGNOSIS — R2689 Other abnormalities of gait and mobility: Secondary | ICD-10-CM | POA: Diagnosis not present

## 2022-04-27 DIAGNOSIS — R2689 Other abnormalities of gait and mobility: Secondary | ICD-10-CM | POA: Diagnosis not present

## 2022-04-27 DIAGNOSIS — M6281 Muscle weakness (generalized): Secondary | ICD-10-CM | POA: Diagnosis not present

## 2022-04-27 DIAGNOSIS — M25562 Pain in left knee: Secondary | ICD-10-CM | POA: Diagnosis not present

## 2022-05-02 DIAGNOSIS — M25562 Pain in left knee: Secondary | ICD-10-CM | POA: Diagnosis not present

## 2022-05-02 DIAGNOSIS — R2689 Other abnormalities of gait and mobility: Secondary | ICD-10-CM | POA: Diagnosis not present

## 2022-05-02 DIAGNOSIS — M6281 Muscle weakness (generalized): Secondary | ICD-10-CM | POA: Diagnosis not present

## 2022-05-02 NOTE — Progress Notes (Signed)
Remote pacemaker transmission.   

## 2022-05-04 DIAGNOSIS — M25562 Pain in left knee: Secondary | ICD-10-CM | POA: Diagnosis not present

## 2022-05-04 DIAGNOSIS — M6281 Muscle weakness (generalized): Secondary | ICD-10-CM | POA: Diagnosis not present

## 2022-05-04 DIAGNOSIS — R2689 Other abnormalities of gait and mobility: Secondary | ICD-10-CM | POA: Diagnosis not present

## 2022-05-05 DIAGNOSIS — M1712 Unilateral primary osteoarthritis, left knee: Secondary | ICD-10-CM | POA: Diagnosis not present

## 2022-05-08 DIAGNOSIS — M6281 Muscle weakness (generalized): Secondary | ICD-10-CM | POA: Diagnosis not present

## 2022-05-08 DIAGNOSIS — M25562 Pain in left knee: Secondary | ICD-10-CM | POA: Diagnosis not present

## 2022-05-08 DIAGNOSIS — R2689 Other abnormalities of gait and mobility: Secondary | ICD-10-CM | POA: Diagnosis not present

## 2022-05-11 DIAGNOSIS — R2689 Other abnormalities of gait and mobility: Secondary | ICD-10-CM | POA: Diagnosis not present

## 2022-05-11 DIAGNOSIS — M6281 Muscle weakness (generalized): Secondary | ICD-10-CM | POA: Diagnosis not present

## 2022-05-11 DIAGNOSIS — M25562 Pain in left knee: Secondary | ICD-10-CM | POA: Diagnosis not present

## 2022-05-16 DIAGNOSIS — R2689 Other abnormalities of gait and mobility: Secondary | ICD-10-CM | POA: Diagnosis not present

## 2022-05-16 DIAGNOSIS — M25562 Pain in left knee: Secondary | ICD-10-CM | POA: Diagnosis not present

## 2022-05-16 DIAGNOSIS — M6281 Muscle weakness (generalized): Secondary | ICD-10-CM | POA: Diagnosis not present

## 2022-05-18 DIAGNOSIS — R2689 Other abnormalities of gait and mobility: Secondary | ICD-10-CM | POA: Diagnosis not present

## 2022-05-18 DIAGNOSIS — M25562 Pain in left knee: Secondary | ICD-10-CM | POA: Diagnosis not present

## 2022-05-18 DIAGNOSIS — M6281 Muscle weakness (generalized): Secondary | ICD-10-CM | POA: Diagnosis not present

## 2022-05-18 NOTE — Progress Notes (Signed)
Follow-up Visit   Date: 05/19/22   SHAINA GULLATT MRN: [id number] DOB: 06-May-1941    CARMYN HAMM is a 81 y.o. right-handed Caucasian female with cardiac arrythmia s/p pacemaker, psoratic arthritis, hypertension, OSA, GERD, and hyperlipidemia returning to the clinic for follow-up of Guillain Barre Syndrome.  The patient was accompanied to the clinic by self.   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  Treated with IVIG (no response) and then plasmapheresis which she has great response.  Clinically, she has residual distal paresthesias which has been stable. 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  Optic nerve disc appear sharp on my exam.  I do not see that she has prior imaging for this, so will order MRI brain wwo contrast.  Ophthalmology notes will be requested Right vocal cord paralysis, secondary to intubation  Return to clinic in 1 year   --------------------------------------------------------------- History of present illness: She presented to [facility] in June and transferred to [facility] with ascending paresthesias, proximal weakness (3/5 hip flexion, 3/5 shoulder extension), dysphagia, and respiratory failure about a week following treatment with Enbrel. She had a complex hospital course with  hyponatremia-induced (Na 119) seizure, respiratory failure requiring resuscitation, bilateral pneumothorax, and right vocal cord paralysis.  Work-up included CSF testing which did not show albuminocytologic dissociation, but given high clinical suspicion was treated for Guillain Barre syndrome with IVIG but did not respond.  She responded to plasmapheresis x 7 session. MRI brain, cervical spine, and thoracic spine was unremarkable.  She slowly improved and discharged to rehab then SNF, where she continued to make improvements.  She is now walking with a rollator/cane and able to walk a few steps  unassisted.  No dysarthria, dysphagia, double vision, or limb weakness.  She still have numbness/tingling in the lower legs/feet. She is now walking with a rollator and able to take steps unassisted.  No falls.   UPDATE 12/03/2020:  She is here for follow-up visit.  She has NCS/EMG in December which was consistent with chronic demyelinating and axonal neuropathy.  She has been very compliant with PT/OT and made significant improvement.  She is no longer using a walker and able to walk unassisted at home, and uses a cone for long distances.  Numbness remains in the distal feet. No new complaints  UPDATE 05/18/2021:  She is here for 6 month visit.  She has been doing great from a neurological stand point.  She only is aware of numbness/tingling in the toes when she flexes her toes.   Most of the time, she is unaware. No new weakness or falls.  She walks unassisted. She tries to be compliant with her PT exercises.  She continues to have shortness of breath and will be seeing cardiology.  She received COVID booster in the Spring and tolerated it well.   UPDATE 05/19/2022: There has been no change to the numbness/tingling in her feet.  She had one fall in July with superficial injuries.  She had a headache after her fall, which has improved now.  No recent headache.  Last fall, she developed a "cloud" in the vision of her right and found to have optic edema involving the right eye.  She has a permanent blind spot.  She had not had brain imaging.  She reports having this involving the left eye in the past when she was younger and saw neurophthalmology.    Medications:  Current Outpatient Medications on File  Prior to Visit  Medication Sig Dispense Refill   acetaminophen (TYLENOL) 325 MG tablet Take 325-650 mg by mouth every 6 (six) hours as needed for mild pain or headache.      clobetasol ointment (TEMOVATE) 0.05 %      furosemide (LASIX) 20 MG tablet Take 1 tablet (20 mg total) by mouth daily as needed for  edema. 30 tablet 3   hydrocerin (EUCERIN) CREA Apply 1 application topically 2 (two) times daily. To bilateral feet.  0   ketoconazole (NIZORAL) 2 % cream Apply 1 application topically daily. 15 g 0   losartan (COZAAR) 25 MG tablet TAKE 1 TABLET BY MOUTH ONCE DAILY 90 tablet 3   methylcellulose (CITRUCEL) oral powder      polyethylene glycol (MIRALAX / GLYCOLAX) 17 g packet Take 17 g by mouth daily as needed for severe constipation. 14 each 0   tacrolimus (PROTOPIC) 0.1 % ointment Apply topically 2 (two) times daily as needed (for psoriasis).      Vitamin D, Ergocalciferol, 50 MCG (2000 UT) CAPS Take by mouth.     doxycycline (VIBRA-TABS) 100 MG tablet Take 100 mg by mouth daily.     No current facility-administered medications on file prior to visit.    Allergies:  Allergies  Allergen Reactions   Etanercept     Guillain Baree   Nickel     Vital Signs:  BP (!) 148/74   Pulse 68   Ht '5\' 6"'$  (1.676 m)   Wt 176 lb (79.8 kg)   SpO2 95%   BMI 28.41 kg/m   Neurological Exam: MENTAL STATUS including orientation to time, place, person, recent and remote memory, attention span and concentration, language, and fund of knowledge is normal.  Speech is not dysarthric, mild hoarseness.  CRANIAL NERVES:  Fundoscopic exam is normal.  No visual field defects.  Pupils equal round and reactive to light.  Normal conjugate, extra-ocular eye movements in all directions of gaze.  Mild left ptosis.  Face is symmetric.  MOTOR:  Motor strength is 5/5 in all extremities, including distally.  No pronator drift.  Tone is normal.    MSRs:                                           Right        Left brachioradialis 2+  2+  biceps 2+  2+  triceps 2+  2+  patellar 1+  2+  ankle jerk 0  0   SENSORY:  Intact to vibration throughout, including ankles bilaterally.  Temperature intact throughout.   COORDINATION/GAIT:    Gait mildly wide-based unsteady without cane, but improved stability with cane.     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    Thank you for allowing me to participate in patient's care.  If I can answer any additional questions, I would be pleased to do so.    Sincerely,    Lizmarie Witters K. Posey Pronto, DO

## 2022-05-19 ENCOUNTER — Encounter: Payer: Self-pay | Admitting: Neurology

## 2022-05-19 ENCOUNTER — Ambulatory Visit (INDEPENDENT_AMBULATORY_CARE_PROVIDER_SITE_OTHER): Payer: Medicare Other | Admitting: Neurology

## 2022-05-19 VITALS — BP 148/74 | HR 68 | Ht 66.0 in | Wt 176.0 lb

## 2022-05-19 DIAGNOSIS — H4710 Unspecified papilledema: Secondary | ICD-10-CM

## 2022-05-19 DIAGNOSIS — G610 Guillain-Barre syndrome: Secondary | ICD-10-CM | POA: Diagnosis not present

## 2022-05-19 NOTE — Patient Instructions (Signed)
We will request records from your ophthalmologist  MRI brain with and without contrast  Encouraged to use cane at all times  Return to clinic in 1 year

## 2022-05-23 DIAGNOSIS — M25562 Pain in left knee: Secondary | ICD-10-CM | POA: Diagnosis not present

## 2022-05-23 DIAGNOSIS — M6281 Muscle weakness (generalized): Secondary | ICD-10-CM | POA: Diagnosis not present

## 2022-05-23 DIAGNOSIS — R2689 Other abnormalities of gait and mobility: Secondary | ICD-10-CM | POA: Diagnosis not present

## 2022-05-23 NOTE — Progress Notes (Signed)
Remote reviewed. Battery status noted.  Leads function stable

## 2022-05-24 ENCOUNTER — Ambulatory Visit (INDEPENDENT_AMBULATORY_CARE_PROVIDER_SITE_OTHER): Payer: Medicare Other | Admitting: Cardiology

## 2022-05-24 ENCOUNTER — Encounter: Payer: Self-pay | Admitting: Cardiology

## 2022-05-24 ENCOUNTER — Telehealth: Payer: Self-pay

## 2022-05-24 VITALS — BP 138/66 | HR 69 | Ht 66.0 in | Wt 179.0 lb

## 2022-05-24 DIAGNOSIS — Z950 Presence of cardiac pacemaker: Secondary | ICD-10-CM

## 2022-05-24 DIAGNOSIS — I10 Essential (primary) hypertension: Secondary | ICD-10-CM | POA: Diagnosis not present

## 2022-05-24 DIAGNOSIS — G4733 Obstructive sleep apnea (adult) (pediatric): Secondary | ICD-10-CM

## 2022-05-24 DIAGNOSIS — I441 Atrioventricular block, second degree: Secondary | ICD-10-CM | POA: Diagnosis not present

## 2022-05-24 DIAGNOSIS — I5032 Chronic diastolic (congestive) heart failure: Secondary | ICD-10-CM

## 2022-05-24 DIAGNOSIS — E785 Hyperlipidemia, unspecified: Secondary | ICD-10-CM | POA: Diagnosis not present

## 2022-05-24 NOTE — Telephone Encounter (Signed)
Called [employer] and per recording no PA is required for radiology services. Per Medicare guidelines Medicare part A&B does not require a PA as well.   Patient may call [facility] and have her MRI scheduled at [phone].  Called patient and left a detailed message per DPR with central scheduling number so patient can call and scheduled her MRI. Left a message to give me a call if any questions or concerns.

## 2022-05-24 NOTE — Patient Instructions (Signed)
Medication Instructions:   Your physician recommends that you continue on your current medications as directed. Please refer to the Current Medication list given to you today.  *If you need a refill on your cardiac medications before your next appointment, please call your pharmacy*   Lab Work:  Your physician recommends that you return for a FASTING lipid profile: At your convenience, or ask you primary at your next visit  - You will need to be fasting. Please do not have anything to eat or drink after midnight the morning you have the lab work. You may only have water or black coffee with no cream or sugar.   - Please go to the [facility] You will check in at the front desk to the right as you walk into the atrium. Valet Parking is offered if needed. - No appointment needed. You may go any day between 7 am and 6 pm.     Follow-Up: At [facility], you and your health needs are our priority.  As part of our continuing mission to provide you with exceptional heart care, we have created designated Provider Care Teams.  These Care Teams include your primary Cardiologist (physician) and Advanced Practice Providers (APPs -  Physician Assistants and Nurse Practitioners) who all work together to provide you with the care you need, when you need it.  We recommend signing up for the patient portal called "MyChart".  Sign up information is provided on this After Visit Summary.  MyChart is used to connect with patients for Virtual Visits (Telemedicine).  Patients are able to view lab/test results, encounter notes, upcoming appointments, etc.  Non-urgent messages can be sent to your provider as well.   To learn more about what you can do with MyChart, go to [email or web address].    Your next appointment:   1 year(s)  The format for your next appointment:   In Person  Provider:   You may see Ida Rogue, MD or one of the following Advanced Practice Providers on your designated Care  Team:   Murray Hodgkins, NP Christell Faith, PA-C Cadence Kathlen Mody, [place]    Other Instructions   Important Information About Sugar

## 2022-05-24 NOTE — Progress Notes (Signed)
Cardiology Clinic Note   Patient Name: Alison Huffman Date of Encounter: 05/24/2022  Primary Care Provider:  Leone Haven, MD Primary Cardiologist:  None  Patient Profile    81 year old female with a past medical history of coronary artery disease, diastolic dysfunction, gastroesophageal reflux disease, Ethelene Hal, hyperlipidemia, hypertension, psoriatic arthritis, sleep apnea, who presents today for follow-up of her CAD.  Past Medical History    Past Medical History:  Diagnosis Date   Ankle pain, right    Atypical chest pain    a. 10/2018 MV: EF 57%. No ischemia/infarct.   Coronary artery disease    Diastolic dysfunction    a. 04/2018 Echo: EF 55-60%, no rwma, Gr1 DD. Triv AI, mild MR. Mod dil LA. Nl RV fxn. PASP nl.   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 X 1   Presence of permanent cardiac pacemaker 07/23/2018   Psoriasis    Psoriatic arthritis ([place])    " dx'd the 1st of this year, 2019" (07/22/2018)   Psoriatic arthritis ([place])    Rosacea    Seborrheic keratosis    Second degree heart block    a. 07/2018 s/p SJM Assurity MRI model PM2271 (Ser# [id number]).   Sleep apnea    "using nasal pilllows since ~ 12/2017" (07/22/2018)   TMJ (dislocation of temporomandibular joint)    Past Surgical History:  Procedure Laterality Date   APPENDECTOMY     BREAST CYST ASPIRATION Bilateral    BREAST CYST EXCISION Right 1978   benign   COLONOSCOPY     COLONOSCOPY WITH PROPOFOL N/A 07/10/2019   Procedure: COLONOSCOPY WITH PROPOFOL;  Surgeon: Lollie Sails, MD;  Location: [facility] ENDOSCOPY;  Service: Endoscopy;  Laterality: N/A;   ESOPHAGOGASTRODUODENOSCOPY (EGD) WITH PROPOFOL N/A 07/10/2019    Procedure: ESOPHAGOGASTRODUODENOSCOPY (EGD) WITH PROPOFOL;  Surgeon: Lollie Sails, MD;  Location: [facility] ENDOSCOPY;  Service: Endoscopy;  Laterality: N/A;   HYSTEROSCOPY WITH D & C N/A 11/05/2018   Procedure: DILATATION AND CURETTAGE /HYSTEROSCOPY;  Surgeon: Homero Fellers, MD;  Location: [facility] ORS;  Service: Gynecology;  Laterality: N/A;   INSERT / REPLACE / REMOVE PACEMAKER     PACEMAKER IMPLANT N/A 07/23/2018   SJM Assurity [id number] implanted by Dr Rayann Heman for mobitz II second degree AV block   UTERINE POLYPS REMOVAL      Allergies  Allergies  Allergen Reactions   Etanercept     Guillain Baree   Nickel     History of Present Illness    81 year old female with past medical history as mentioned above coronary artery disease, diastolic dysfunction, gastroesophageal reflux disease, Guillain-Barr, hyperlipidemia, hypertension, cervical neck arthritis, sleep apnea, second-degree AV block requiring permanent pacemaker placement.  Previous issues with Ethelene Hal syndrome spent 9 days on the ventilator at [facility] in 2021.  This was 1 week after receiving her Enbrel shot she developed numbness and tingling in her lower extremities.  Required intubation for airway protection and transferred to the ICU.  7/6 patient was a CODE BLUE and sustained bilateral pneumothoraxes after receiving bag ventilation for which bilateral chest tubes were placed.  She started on PLEX 7/7 per neuro  recommendation.  Patient extubated on 7/11 and weaned to high flow nasal cannula.  She ended up going to rehab to learn how to walk at that point again.  She has recently followed up with neurology who has ordered an MRI of the brain without contrast due to optic nerve edema diagnosed by ophthalmology in 2022.  She also has right vocal cord paralysis secondary to intubation.  She is also just finished with pulmonary rehab for continued shortness of breath that she has had since her exacerbation of Ethelene Hal that  required plasmapheresis.  She returns today stating that she has been doing fairly well.  She has had issues with her left knee which is required injection and she is following up with orthopedics and is stated that her next injection would likely be in gel but she would not require knee replacement.  Several months prior she has status post mechanical fall when she hit her head.  She denies chest pain, chest pressure, palpitations, or shortness of breath.  She does endorse some occasional increased peripheral edema.  Home Medications    Current Outpatient Medications  Medication Sig Dispense Refill   acetaminophen (TYLENOL) 325 MG tablet Take 325-650 mg by mouth every 6 (six) hours as needed for mild pain or headache.      clobetasol ointment (TEMOVATE) 0.05 %      furosemide (LASIX) 20 MG tablet Take 1 tablet (20 mg total) by mouth daily as needed for edema. 30 tablet 3   hydrocerin (EUCERIN) CREA Apply 1 application topically 2 (two) times daily. To bilateral feet.  0   ketoconazole (NIZORAL) 2 % cream Apply 1 application topically daily. 15 g 0   losartan (COZAAR) 25 MG tablet TAKE 1 TABLET BY MOUTH ONCE DAILY 90 tablet 3   Naproxen Sodium (ALEVE PO) Take by mouth as needed.     polyethylene glycol (MIRALAX / GLYCOLAX) 17 g packet Take 17 g by mouth daily as needed for severe constipation. 14 each 0   tacrolimus (PROTOPIC) 0.1 % ointment Apply topically 2 (two) times daily as needed (for psoriasis).      Vitamin D, Ergocalciferol, 50 MCG (2000 UT) CAPS Take by mouth.     doxycycline (VIBRA-TABS) 100 MG tablet Take 100 mg by mouth daily.     methylcellulose (CITRUCEL) oral powder  (Patient not taking: Reported on 05/24/2022)     No current facility-administered medications for this visit.     Family History    Family History  Problem Relation Age of Onset   Stroke Mother    Atrial fibrillation Mother    Breast cancer Mother    Stroke Father    Breast cancer Maternal Aunt    Breast  cancer Paternal Aunt    She indicated that her mother is deceased. She indicated that her father is deceased. She indicated that her maternal grandmother is deceased. She indicated that her maternal grandfather is deceased. She indicated that her paternal grandmother is deceased. She indicated that her paternal grandfather is deceased. She indicated that her maternal aunt is deceased. She indicated that her paternal aunt is deceased.  Social History    Social History   Socioeconomic History   Marital status: Single    Spouse name: Not on file   Number of children: Not on file   Years of education: Not on file   Highest education level: Not on file  Occupational History   Not on file  Tobacco Use   Smoking status: Never  Smokeless tobacco: Never   Tobacco comments:    07/22/2018 "smoked some in college; 1960s;  nothing since"  Vaping Use   Vaping Use: Never used  Substance and Sexual Activity   Alcohol use: Yes    Comment: rarely   Drug use: Never   Sexual activity: Not Currently    Birth control/protection: Post-menopausal  Other Topics Concern   Not on file  Social History Narrative   Right Handed   Lives in an apartment. 5th floor but has elevator    Drinks Caffeine.    Social Determinants of Health   Financial Resource Strain: Low Risk  (11/09/2021)   Overall Financial Resource Strain (CARDIA)    Difficulty of Paying Living Expenses: Not hard at all  Food Insecurity: No Food Insecurity (11/09/2021)   Hunger Vital Sign    Worried About Running Out of Food in the Last Year: Never true    Ran Out of Food in the Last Year: Never true  Transportation Needs: No Transportation Needs (11/09/2021)   PRAPARE - Hydrologist (Medical): No    Lack of Transportation (Non-Medical): No  Physical Activity: Sufficiently Active (11/09/2021)   Exercise Vital Sign    Days of Exercise per Week: 4 days    Minutes of Exercise per Session: 60 min  Stress: No Stress  Concern Present (11/09/2021)   [place]    Feeling of Stress : Not at all  Social Connections: Unknown (11/09/2021)   Social Connection and Isolation Panel [NHANES]    Frequency of Communication with Friends and Family: More than three times a week    Frequency of Social Gatherings with Friends and Family: Not on file    Attends Religious Services: Not on file    Active Member of Clubs or Organizations: Not on file    Attends Archivist Meetings: Not on file    Marital Status: Not on file  Intimate Partner Violence: Not At Risk (11/09/2021)   Humiliation, Afraid, Rape, and Kick questionnaire    Fear of Current or Ex-Partner: No    Emotionally Abused: No    Physically Abused: No    Sexually Abused: No     Review of Systems    General:  No chills, fever, night sweats or weight changes. Endorses fatigue.  Cardiovascular:  No chest pain, dyspnea on exertion, endorses occasional peripheral edema edema, orthopnea, palpitations, paroxysmal nocturnal dyspnea. Dermatological: No rash, lesions/masses Respiratory: No cough, dyspnea Urologic: No hematuria, dysuria Abdominal:   No nausea, vomiting, diarrhea, bright red blood per rectum, melena, or hematemesis Neurologic:  No visual changes, wkns, changes in mental status. All other systems reviewed and are otherwise negative except as noted above.     Physical Exam    VS:  BP 138/66 (BP Location: Left Arm, Patient Position: Sitting, Cuff Size: Normal)   Pulse 69   Ht '5\' 6"'$  (1.676 m)   Wt 179 lb (81.2 kg)   SpO2 98%   BMI 28.89 kg/m  , BMI Body mass index is 28.89 kg/m.     GEN: Well nourished, well developed, in no acute distress. HEENT: normal. Neck: Supple, no JVD, carotid bruits, or masses. Cardiac: RRR, I/VI systolic murmur best heard at the RSB, without rubs, or gallops. No clubbing, cyanosis, trace pretibial edema.  Radials/DP/PT 2+ and equal bilaterally.   Respiratory:  Respirations regular and unlabored, clear to auscultation bilaterally. GI: Soft, nontender, nondistended, BS + x 4.  MS: no deformity or atrophy. Skin: warm and dry, no rash. Neuro:  Strength and sensation are intact. Psych: Normal affect.  Accessory Clinical Findings    ECG personally reviewed by me today-EKG done today reveals normal sinus rhythm with a rate of 69 with a chronic right bundle branch block- No acute changes  Lab Results  Component Value Date   WBC 5.4 12/22/2021   HGB 12.6 12/22/2021   HCT 39.2 12/22/2021   MCV 94.5 12/22/2021   PLT 232 12/22/2021   Lab Results  Component Value Date   CREATININE 0.56 12/22/2021   BUN 11 12/22/2021   NA 140 12/22/2021   K 3.9 12/22/2021   CL 105 12/22/2021   CO2 27 12/22/2021   Lab Results  Component Value Date   ALT 14 12/22/2021   AST 18 12/22/2021   ALKPHOS 85 12/22/2021   BILITOT 0.9 12/22/2021   Lab Results  Component Value Date   CHOL 234 (H) 11/09/2020   HDL 52.80 11/09/2020   LDLCALC 159 (H) 11/09/2020   LDLDIRECT 94.0 10/06/2019   TRIG 112.0 11/09/2020   CHOLHDL 4 11/09/2020    Lab Results  Component Value Date   HGBA1C 6.0 (H) 04/03/2020    Assessment & Plan   1.  Essential hypertension with blood pressure today 138/66, which is stable.  She is continue with losartan 25 mg daily and furosemide 20 mg as needed for peripheral edema.  She is also been recommended to continue with monitoring her blood pressure at home.  No changes made to her medications or refills needed today.  2.  Mixed hyperlipidemia LDL of 159 on 11/03/2018.  Lipid panel ordered.  On she is currently on no statin medications and previous been educated by her PCP on dietary recommendations.  This continues to be followed by PCP.  3.  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  Pacemaker is functioning appropriately at her last check.  She continues with remote interrogations in the center  [place].  She is continue to keep her follow-up with Dr. Quentin Ore as well.  4.  Chronic diastolic heart failure with LVEF 55 to 60% on last echocardiogram done 04/12/2020, no regional wall motion abnormalities, grade 1 diastolic dysfunction with impaired relaxation, left atrial size moderately dilated, mild aortic valve sclerosis is present without evidence of stenosis.  After lipid panel is complete likely need statin.  5.  Obstructive sleep apnea compliant with CPAP continues to be followed by pulmonary  6.  Disposition patient return to clinic to see MD/APP in 1 year or sooner if needed with EKG on return.  Kristianna Saperstein, NP 05/24/2022, 2:29 PM

## 2022-05-25 ENCOUNTER — Other Ambulatory Visit (HOSPITAL_COMMUNITY): Payer: Self-pay | Admitting: Neurology

## 2022-05-25 DIAGNOSIS — H4710 Unspecified papilledema: Secondary | ICD-10-CM

## 2022-05-25 DIAGNOSIS — M25562 Pain in left knee: Secondary | ICD-10-CM | POA: Diagnosis not present

## 2022-05-25 DIAGNOSIS — R2689 Other abnormalities of gait and mobility: Secondary | ICD-10-CM | POA: Diagnosis not present

## 2022-05-25 DIAGNOSIS — M6281 Muscle weakness (generalized): Secondary | ICD-10-CM | POA: Diagnosis not present

## 2022-05-25 DIAGNOSIS — G610 Guillain-Barre syndrome: Secondary | ICD-10-CM

## 2022-05-26 ENCOUNTER — Other Ambulatory Visit: Payer: Self-pay | Admitting: Family Medicine

## 2022-05-26 DIAGNOSIS — Z1231 Encounter for screening mammogram for malignant neoplasm of breast: Secondary | ICD-10-CM

## 2022-05-28 ENCOUNTER — Encounter: Payer: Self-pay | Admitting: Neurology

## 2022-05-30 DIAGNOSIS — R2689 Other abnormalities of gait and mobility: Secondary | ICD-10-CM | POA: Diagnosis not present

## 2022-05-30 DIAGNOSIS — M25562 Pain in left knee: Secondary | ICD-10-CM | POA: Diagnosis not present

## 2022-05-30 DIAGNOSIS — M6281 Muscle weakness (generalized): Secondary | ICD-10-CM | POA: Diagnosis not present

## 2022-06-08 DIAGNOSIS — R2689 Other abnormalities of gait and mobility: Secondary | ICD-10-CM | POA: Diagnosis not present

## 2022-06-08 DIAGNOSIS — M6281 Muscle weakness (generalized): Secondary | ICD-10-CM | POA: Diagnosis not present

## 2022-06-08 DIAGNOSIS — M25562 Pain in left knee: Secondary | ICD-10-CM | POA: Diagnosis not present

## 2022-06-13 DIAGNOSIS — M25562 Pain in left knee: Secondary | ICD-10-CM | POA: Diagnosis not present

## 2022-06-13 DIAGNOSIS — R2689 Other abnormalities of gait and mobility: Secondary | ICD-10-CM | POA: Diagnosis not present

## 2022-06-13 DIAGNOSIS — M6281 Muscle weakness (generalized): Secondary | ICD-10-CM | POA: Diagnosis not present

## 2022-06-15 DIAGNOSIS — R2689 Other abnormalities of gait and mobility: Secondary | ICD-10-CM | POA: Diagnosis not present

## 2022-06-15 DIAGNOSIS — M25562 Pain in left knee: Secondary | ICD-10-CM | POA: Diagnosis not present

## 2022-06-15 DIAGNOSIS — M6281 Muscle weakness (generalized): Secondary | ICD-10-CM | POA: Diagnosis not present

## 2022-06-19 ENCOUNTER — Ambulatory Visit
Admission: RE | Admit: 2022-06-19 | Discharge: 2022-06-19 | Disposition: A | Discharge status: Home / Self Care | Payer: Medicare Other | Source: Ambulatory Visit | Attending: Adult Health | Admitting: Adult Health

## 2022-06-19 DIAGNOSIS — J8410 Pulmonary fibrosis, unspecified: Secondary | ICD-10-CM | POA: Diagnosis not present

## 2022-06-19 DIAGNOSIS — K8020 Calculus of gallbladder without cholecystitis without obstruction: Secondary | ICD-10-CM | POA: Diagnosis not present

## 2022-06-19 DIAGNOSIS — J479 Bronchiectasis, uncomplicated: Secondary | ICD-10-CM | POA: Insufficient documentation

## 2022-06-19 DIAGNOSIS — J849 Interstitial pulmonary disease, unspecified: Secondary | ICD-10-CM | POA: Insufficient documentation

## 2022-06-19 DIAGNOSIS — I2510 Atherosclerotic heart disease of native coronary artery without angina pectoris: Secondary | ICD-10-CM | POA: Insufficient documentation

## 2022-06-19 DIAGNOSIS — I700 Atherosclerosis of aorta: Secondary | ICD-10-CM | POA: Diagnosis not present

## 2022-06-19 DIAGNOSIS — R918 Other nonspecific abnormal finding of lung field: Secondary | ICD-10-CM | POA: Diagnosis not present

## 2022-06-19 DIAGNOSIS — R0602 Shortness of breath: Secondary | ICD-10-CM | POA: Diagnosis not present

## 2022-06-20 DIAGNOSIS — M6281 Muscle weakness (generalized): Secondary | ICD-10-CM | POA: Diagnosis not present

## 2022-06-20 DIAGNOSIS — M25562 Pain in left knee: Secondary | ICD-10-CM | POA: Diagnosis not present

## 2022-06-20 DIAGNOSIS — R2689 Other abnormalities of gait and mobility: Secondary | ICD-10-CM | POA: Diagnosis not present

## 2022-06-22 DIAGNOSIS — M6281 Muscle weakness (generalized): Secondary | ICD-10-CM | POA: Diagnosis not present

## 2022-06-22 DIAGNOSIS — M25562 Pain in left knee: Secondary | ICD-10-CM | POA: Diagnosis not present

## 2022-06-22 DIAGNOSIS — R2689 Other abnormalities of gait and mobility: Secondary | ICD-10-CM | POA: Diagnosis not present

## 2022-06-27 ENCOUNTER — Ambulatory Visit
Admission: RE | Admit: 2022-06-27 | Discharge: 2022-06-27 | Disposition: A | Discharge status: Home / Self Care | Payer: Medicare Other | Source: Ambulatory Visit | Attending: Family Medicine | Admitting: Family Medicine

## 2022-06-27 DIAGNOSIS — M25562 Pain in left knee: Secondary | ICD-10-CM | POA: Diagnosis not present

## 2022-06-27 DIAGNOSIS — Z1231 Encounter for screening mammogram for malignant neoplasm of breast: Secondary | ICD-10-CM | POA: Insufficient documentation

## 2022-06-27 DIAGNOSIS — R2689 Other abnormalities of gait and mobility: Secondary | ICD-10-CM | POA: Diagnosis not present

## 2022-06-27 DIAGNOSIS — M6281 Muscle weakness (generalized): Secondary | ICD-10-CM | POA: Diagnosis not present

## 2022-06-28 ENCOUNTER — Encounter: Payer: Self-pay | Admitting: Pulmonary Disease

## 2022-06-28 ENCOUNTER — Ambulatory Visit (INDEPENDENT_AMBULATORY_CARE_PROVIDER_SITE_OTHER): Payer: Medicare Other | Admitting: Pulmonary Disease

## 2022-06-28 VITALS — BP 140/80 | HR 67 | Ht 66.0 in | Wt 181.0 lb

## 2022-06-28 DIAGNOSIS — G4733 Obstructive sleep apnea (adult) (pediatric): Secondary | ICD-10-CM | POA: Diagnosis not present

## 2022-06-28 DIAGNOSIS — J479 Bronchiectasis, uncomplicated: Secondary | ICD-10-CM | POA: Diagnosis not present

## 2022-06-28 NOTE — Patient Instructions (Signed)
Follow up in 6 months 

## 2022-06-28 NOTE — Progress Notes (Signed)
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  Brief Summary:  Alison Huffman is a 81 y.o. female with obstructive sleep apnea, lung nodule and bronchiectasis.      Subjective:   CT chest from earlier this month looked better.  Has stable changes of regional bronchiectasis, and lung nodule.  Not having much cough.  Not having chest pain, sputum, or wheezing.  Doesn't feel like her breathing limits her activity level.    Using CPAP at night.  No issue with mask fit or pressure setting.  Physical Exam:   Appearance - well kempt   ENMT - no sinus tenderness, no oral exudate, no LAN, Mallampati 3 airway, no stridor  Respiratory - equal breath sounds bilaterally, no wheezing or rales  CV - s1s2 regular rate and rhythm, no murmurs  Ext - no clubbing, no edema  Skin - no rashes  Psych - normal mood and affect     Chest Imaging:  CT chest 06/05/20 >> mild cylindrical BTX RLL, mild GGO Rt lung base CT chest 12/16/20 >> stable nodules, ATX CT chest 12/18/20 >> stable BTX RLL, faint GGO with subpleural  reticulation in periphery RML and lower lobes, Rt base nodule now 4 mm (was 6 mm), stable RUL nodules 4 mm and 3 mm, calcified granulomas b/l, stable thin wall cyst LUL HRCT chest 06/19/22 >> cylindrical BTX, calcified granulomas, scattered nodules up to 5 mm, GGO resolved, air trapping  Sleep Tests:  PSG 01/24/18 >> AHI 24.1, SpO2 low 84.4%; CPAP 8 cm H2O Auto CPAP 11/20/21 to 12/19/21 >> used on 30 of 30 nights with average 8 hr 9 min.  Average AHI 1.9 with median CPAP 8 and 95 th percentile CPAP 11 cm H2O  Cardiac Tests:  Echo 04/12/20 >> EF 55 to 60%, grade 1 DD, mod LA dilation  Social History:  She  reports that she has never smoked. She has never used smokeless tobacco. She reports current alcohol use. She reports that she does not use drugs.  Family History:  Her family history includes Atrial fibrillation in her mother; Breast cancer in her maternal aunt, mother, and paternal aunt; Stroke in her father and mother.     Assessment/Plan:   Obstructive sleep apnea. - she is compliant with CPAP and reports benefit from therapy - gets supplies from [place] - continue auto CPAP 5 to 15 cm H2O  Right lower lobe bronchiectasis. - mild and likely post-infectious - most recent CT chest showed improved appearance of GGO  History of Guillain Barre Syndrome. - followed by Dr. Narda Amber with [facility]  Second degree heart block s/p pacemaker. - followed by Dr. Thompson Grayer with  [facility]  Time Spent Involved in Patient Care on Day of Examination:  26 minutes  Follow up:   Patient Instructions  Follow up in 6 months  Medication List:   Allergies as of 06/28/2022       Reactions   Etanercept    Guillain Baree   Nickel         Medication List        Accurate as of June 28, 2022 12:25 PM. If you have any questions, ask your nurse or doctor.          STOP taking these medications    doxycycline 100 MG tablet Commonly known as: VIBRA-TABS Stopped  by: Chesley Mires, MD       TAKE these medications    acetaminophen 325 MG tablet Commonly known as: TYLENOL Take 325-650 mg by mouth every 6 (six) hours as needed for mild pain or headache.   ALEVE PO Take by mouth as needed.   Citrucel oral powder Generic drug: methylcellulose   clobetasol ointment 0.05 % Commonly known as: TEMOVATE   furosemide 20 MG tablet Commonly known as: LASIX Take 1 tablet (20 mg total) by mouth daily as needed for edema.   hydrocerin Crea Apply 1 application topically 2 (two) times daily. To bilateral feet.   ketoconazole 2 % cream Commonly known as: NIZORAL Apply 1 application topically daily.   losartan 25 MG tablet Commonly known as: COZAAR TAKE 1 TABLET BY MOUTH ONCE DAILY   polyethylene glycol 17 g packet Commonly known as: MIRALAX / GLYCOLAX Take 17 g by mouth daily as needed for severe constipation.   tacrolimus 0.1 % ointment Commonly known as: PROTOPIC Apply topically 2 (two) times daily as needed (for psoriasis).   Vitamin D (Ergocalciferol) 50 MCG (2000 UT) Caps Take by mouth.        Signature:  Chesley Mires, MD [place] Pager - [phone] 06/28/2022, 12:25 PM

## 2022-06-29 DIAGNOSIS — J3801 Paralysis of vocal cords and larynx, unilateral: Secondary | ICD-10-CM | POA: Diagnosis not present

## 2022-06-29 DIAGNOSIS — H6123 Impacted cerumen, bilateral: Secondary | ICD-10-CM | POA: Diagnosis not present

## 2022-06-29 DIAGNOSIS — M6281 Muscle weakness (generalized): Secondary | ICD-10-CM | POA: Diagnosis not present

## 2022-06-29 DIAGNOSIS — M25562 Pain in left knee: Secondary | ICD-10-CM | POA: Diagnosis not present

## 2022-06-29 DIAGNOSIS — R2689 Other abnormalities of gait and mobility: Secondary | ICD-10-CM | POA: Diagnosis not present

## 2022-06-29 DIAGNOSIS — H903 Sensorineural hearing loss, bilateral: Secondary | ICD-10-CM | POA: Diagnosis not present

## 2022-07-04 DIAGNOSIS — R2689 Other abnormalities of gait and mobility: Secondary | ICD-10-CM | POA: Diagnosis not present

## 2022-07-04 DIAGNOSIS — M6281 Muscle weakness (generalized): Secondary | ICD-10-CM | POA: Diagnosis not present

## 2022-07-04 DIAGNOSIS — M25562 Pain in left knee: Secondary | ICD-10-CM | POA: Diagnosis not present

## 2022-07-06 DIAGNOSIS — M25562 Pain in left knee: Secondary | ICD-10-CM | POA: Diagnosis not present

## 2022-07-06 DIAGNOSIS — M6281 Muscle weakness (generalized): Secondary | ICD-10-CM | POA: Diagnosis not present

## 2022-07-06 DIAGNOSIS — R2689 Other abnormalities of gait and mobility: Secondary | ICD-10-CM | POA: Diagnosis not present

## 2022-07-10 ENCOUNTER — Telehealth: Payer: Self-pay | Admitting: Family Medicine

## 2022-07-10 ENCOUNTER — Ambulatory Visit (HOSPITAL_COMMUNITY)
Admission: RE | Admit: 2022-07-10 | Discharge: 2022-07-10 | Disposition: A | Discharge status: Home / Self Care | Payer: Medicare Other | Source: Ambulatory Visit | Attending: Neurology | Admitting: Neurology

## 2022-07-10 DIAGNOSIS — I6782 Cerebral ischemia: Secondary | ICD-10-CM | POA: Diagnosis not present

## 2022-07-10 DIAGNOSIS — G610 Guillain-Barre syndrome: Secondary | ICD-10-CM | POA: Insufficient documentation

## 2022-07-10 DIAGNOSIS — H4710 Unspecified papilledema: Secondary | ICD-10-CM | POA: Diagnosis not present

## 2022-07-10 MED ORDER — GADOBUTROL 1 MMOL/ML IV SOLN
8.0000 mL | Freq: Once | INTRAVENOUS | Status: AC | PRN
  Administered 2022-07-10: 8 mL via INTRAVENOUS

## 2022-07-10 NOTE — Telephone Encounter (Signed)
Erica from [facility] called stating they need face sheet and insurance information for pt Fax [phone]

## 2022-07-10 NOTE — Progress Notes (Signed)
  Informed of MRI for today.   Previously scanned without issue. to be MRI conditional, with implant date > 6 weeks ago, and no evidence of abandoned or epicardial leads in review of most recent CXR  Interrogation from today reviewed by industry rep who recommended no changes for MRI.    Shirley Friar, PA-C  07/10/2022 12:53 PM

## 2022-07-10 NOTE — Progress Notes (Signed)
Patient here today at [facility] for MRI brain w wo contrast. IV placed. Transmission sent. No changes necessary per industry rep.

## 2022-07-10 NOTE — Telephone Encounter (Signed)
Faxed facesheet and insurance information and confirmation given.  Siyah Mault,cma

## 2022-07-12 ENCOUNTER — Ambulatory Visit (INDEPENDENT_AMBULATORY_CARE_PROVIDER_SITE_OTHER): Payer: Medicare Other

## 2022-07-12 DIAGNOSIS — R2689 Other abnormalities of gait and mobility: Secondary | ICD-10-CM | POA: Diagnosis not present

## 2022-07-12 DIAGNOSIS — I441 Atrioventricular block, second degree: Secondary | ICD-10-CM | POA: Diagnosis not present

## 2022-07-12 DIAGNOSIS — M6281 Muscle weakness (generalized): Secondary | ICD-10-CM | POA: Diagnosis not present

## 2022-07-12 DIAGNOSIS — M25562 Pain in left knee: Secondary | ICD-10-CM | POA: Diagnosis not present

## 2022-07-12 LAB — CUP PACEART REMOTE DEVICE CHECK
Battery Remaining Longevity: 90 mo
Battery Remaining Percentage: 68 %
Battery Voltage: 2.99 V
Brady Statistic AP VP Percent: 1 %
Brady Statistic AP VS Percent: 6.7 %
Brady Statistic AS VP Percent: 16 %
Brady Statistic AS VS Percent: 75 %
Brady Statistic RA Percent Paced: 5.6 %
Brady Statistic RV Percent Paced: 17 %
Date Time Interrogation Session: 20231011031426
Implantable Lead Implant Date: 20191022
Implantable Lead Implant Date: 20191022
Implantable Lead Location: 753859
Implantable Lead Location: 753860
Implantable Pulse Generator Implant Date: 20191022
Lead Channel Impedance Value: 640 Ohm
Lead Channel Impedance Value: 760 Ohm
Lead Channel Pacing Threshold Amplitude: 0.5 V
Lead Channel Pacing Threshold Amplitude: 0.75 V
Lead Channel Pacing Threshold Pulse Width: 0.5 ms
Lead Channel Pacing Threshold Pulse Width: 0.5 ms
Lead Channel Sensing Intrinsic Amplitude: 12 mV
Lead Channel Sensing Intrinsic Amplitude: 4.9 mV
Lead Channel Setting Pacing Amplitude: 1 V
Lead Channel Setting Pacing Amplitude: 2 V
Lead Channel Setting Pacing Pulse Width: 0.5 ms
Lead Channel Setting Sensing Sensitivity: 2 mV
Pulse Gen Model: 2272
Pulse Gen Serial Number: 9073848

## 2022-07-14 DIAGNOSIS — R2689 Other abnormalities of gait and mobility: Secondary | ICD-10-CM | POA: Diagnosis not present

## 2022-07-14 DIAGNOSIS — M6281 Muscle weakness (generalized): Secondary | ICD-10-CM | POA: Diagnosis not present

## 2022-07-14 DIAGNOSIS — M25562 Pain in left knee: Secondary | ICD-10-CM | POA: Diagnosis not present

## 2022-07-17 ENCOUNTER — Telehealth: Payer: Self-pay | Admitting: Family Medicine

## 2022-07-17 ENCOUNTER — Ambulatory Visit (INDEPENDENT_AMBULATORY_CARE_PROVIDER_SITE_OTHER): Payer: Medicare Other | Admitting: Family Medicine

## 2022-07-17 ENCOUNTER — Encounter: Payer: Self-pay | Admitting: Family Medicine

## 2022-07-17 DIAGNOSIS — J3800 Paralysis of vocal cords and larynx, unspecified: Secondary | ICD-10-CM

## 2022-07-17 DIAGNOSIS — R413 Other amnesia: Secondary | ICD-10-CM | POA: Diagnosis not present

## 2022-07-17 DIAGNOSIS — I10 Essential (primary) hypertension: Secondary | ICD-10-CM

## 2022-07-17 DIAGNOSIS — N83202 Unspecified ovarian cyst, left side: Secondary | ICD-10-CM | POA: Diagnosis not present

## 2022-07-17 DIAGNOSIS — E782 Mixed hyperlipidemia: Secondary | ICD-10-CM

## 2022-07-17 DIAGNOSIS — I6789 Other cerebrovascular disease: Secondary | ICD-10-CM | POA: Diagnosis not present

## 2022-07-17 DIAGNOSIS — M7120 Synovial cyst of popliteal space [Baker], unspecified knee: Secondary | ICD-10-CM | POA: Insufficient documentation

## 2022-07-17 DIAGNOSIS — M7122 Synovial cyst of popliteal space [Baker], left knee: Secondary | ICD-10-CM | POA: Diagnosis not present

## 2022-07-17 DIAGNOSIS — R600 Localized edema: Secondary | ICD-10-CM

## 2022-07-17 DIAGNOSIS — G610 Guillain-Barre syndrome: Secondary | ICD-10-CM | POA: Diagnosis not present

## 2022-07-17 DIAGNOSIS — R7303 Prediabetes: Secondary | ICD-10-CM

## 2022-07-17 LAB — LIPID PANEL
Cholesterol: 227 mg/dL — ABNORMAL HIGH (ref 0–200)
HDL: 53.2 mg/dL (ref >39.00)
LDL Cholesterol: 157 mg/dL — ABNORMAL HIGH (ref 0–99)
NonHDL: 174.17
Total CHOL/HDL Ratio: 4
Triglycerides: 88 mg/dL (ref 0.0–149.0)
VLDL: 17.6 mg/dL (ref 0.0–40.0)

## 2022-07-17 LAB — COMPREHENSIVE METABOLIC PANEL
ALT: 12 U/L (ref 0–35)
AST: 13 U/L (ref 0–37)
Albumin: 4.1 g/dL (ref 3.5–5.2)
Alkaline Phosphatase: 109 U/L (ref 39–117)
BUN: 12 mg/dL (ref 6–23)
CO2: 26 mEq/L (ref 19–32)
Calcium: 9.4 mg/dL (ref 8.4–10.5)
Chloride: 105 mEq/L (ref 96–112)
Creatinine, Ser: 0.53 mg/dL (ref 0.40–1.20)
GFR: 85.96 mL/min (ref >60.00)
Glucose, Bld: 82 mg/dL (ref 70–99)
Potassium: 4.2 mEq/L (ref 3.5–5.1)
Sodium: 140 mEq/L (ref 135–145)
Total Bilirubin: 0.6 mg/dL (ref 0.2–1.2)
Total Protein: 6.8 g/dL (ref 6.0–8.3)

## 2022-07-17 LAB — HEMOGLOBIN A1C: Hgb A1c MFr Bld: 6 % (ref 4.6–6.5)

## 2022-07-17 NOTE — Assessment & Plan Note (Signed)
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  Discussed asking the orthopedist about her Baker's cyst and if anything needs to be done about this.

## 2022-07-17 NOTE — Assessment & Plan Note (Signed)
She will let me know if she does not hear from speech therapy on getting an appointment scheduled.

## 2022-07-17 NOTE — Assessment & Plan Note (Signed)
Very mild.  She will monitor and if she has any worsening she will let us know.

## 2022-07-17 NOTE — Progress Notes (Addendum)
Tommi Rumps, MD Phone: [phone]  Alison BATDORF is a 81 y.o. female who presents today for f/u.  HYPERTENSION Disease Monitoring Home BP Monitoring 130/70 most of the time, occasionally higher Chest pain- no    Dyspnea- chronic and stable related to chronic lung disease Medications Compliance-  taking losartan.  Edema- chronic BMET    Component Value Date/Time   NA 140 12/22/2021 1256   NA 142 05/23/2021 1210   K 3.9 12/22/2021 1256   CL 105 12/22/2021 1256   CO2 27 12/22/2021 1256   GLUCOSE 93 12/22/2021 1256   BUN 11 12/22/2021 1256   BUN 13 05/23/2021 1210   CREATININE 0.56 12/22/2021 1256   CALCIUM 9.3 12/22/2021 1256   GFRNONAA >60 12/22/2021 1256   GFRAA >60 05/03/2020 0513   Chronic edema: Patient notes this is a chronic issue.  Left greater than right lower extremities.  She has been wearing compression stockings intermittently.  Notes no orthopnea or PND.  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  We referred her to speech therapy at her facility last week.  Guillain-Barr: She saw neurology and things have been stable after her recovery from this.  She notes she told the neurologist about some vision changes she had and they ordered an MRI for papilledema.  The MRI was negative for cause of this though did reveal some chronic microvascular changes.  Memory difficulty: Patient notes some mild memory trouble with remembering names and words at times.  She notes no ADL issues.  Patient wonders about taking the new COVID-vaccine given her history of Guillain-Barr.  Ovarian cyst: She notes its been sometime since she saw GYN and notes her GYN left the practice that she was previously going to.  Baker's cyst: She saw orthopedics.  She notes they diagnosed her with osteoarthritis in her left knee.  She notes they have done 2 steroid injections in her knee and she is due to see them again in the  near future.  She notes they did not mention the Baker's cyst.  Social History   Tobacco Use  Smoking Status Never  Smokeless Tobacco Never  Tobacco Comments   07/22/2018 "smoked some in college; 1960s;  nothing since"    Current Outpatient Medications on File Prior to Visit  Medication Sig Dispense Refill   acetaminophen (TYLENOL) 325 MG tablet Take 325-650 mg by mouth every 6 (six) hours as needed for mild pain or headache.      clobetasol ointment (TEMOVATE) 0.05 %      furosemide (LASIX) 20 MG tablet Take 1 tablet (20 mg total) by mouth daily as needed for edema. 30 tablet 3   hydrocerin (EUCERIN) CREA Apply 1 application topically 2 (two) times daily. To bilateral feet.  0   ketoconazole (NIZORAL) 2 % cream Apply 1 application topically daily. 15 g 0   losartan (COZAAR) 25 MG tablet TAKE 1 TABLET BY MOUTH ONCE DAILY 90 tablet 3   methylcellulose (CITRUCEL) oral powder      Naproxen Sodium (ALEVE PO) Take by mouth as needed.     polyethylene glycol (MIRALAX / GLYCOLAX) 17 g packet Take 17 g by mouth daily as needed for severe constipation. 14 each 0   tacrolimus (PROTOPIC) 0.1 % ointment Apply topically 2 (two) times daily as needed (for psoriasis).      Vitamin D, Ergocalciferol, 50 MCG (2000 UT) CAPS Take by mouth.  No current facility-administered medications on file prior to visit.     ROS see history of present illness  Objective  Physical Exam Vitals:   07/17/22 0828  BP: 130/70  Pulse: 64  Temp: 97.8 F (36.6 C)  SpO2: 98%    BP Readings from Last 3 Encounters:  07/17/22 130/70  06/28/22 (!) 140/80  05/24/22 138/66   Wt Readings from Last 3 Encounters:  07/17/22 181 lb (82.1 kg)  06/28/22 181 lb (82.1 kg)  05/24/22 179 lb (81.2 kg)    Physical Exam Constitutional:      General: She is not in acute distress.    Appearance: She is not diaphoretic.  Cardiovascular:     Rate and Rhythm: Normal rate and regular rhythm.     Heart sounds: Normal  heart sounds.  Pulmonary:     Effort: Pulmonary effort is normal.     Breath sounds: Normal breath sounds.  Musculoskeletal:     Comments: 1+ pitting edema bilateral lower extremities, left lower extremity is slightly larger than the right lower extremity, varicose veins noted greater in the left lower extremity  Skin:    General: Skin is warm and dry.  Neurological:     Mental Status: She is alert.      Assessment/Plan: Please see individual problem list.  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  Discussed asking the orthopedist about her Baker's cyst and if anything needs to be done about this.      Bilateral leg edema (Chronic)    Likely venous insufficiency though could also be related to her varicose veins.  Discussed compression stockings.  Discussed continued as needed use of Lasix.      Cyst of left ovary (Chronic)    Refer to a new gynecologist for her to continue follow-up with on this issue.      Relevant Orders   Ambulatory referral to Gynecology   Guillain Barr syndrome [facility]) (Chronic)    Patient has improved significantly from her initial presentation.  She will continue to follow with her specialists on this.      Hyperlipidemia (Chronic)    Check labs.  Discussed adequate control of this given microvascular changes on MRI.      Relevant Orders   Lipid panel   Comp Met (CMET)   Hypertension (Chronic)    Well-controlled for age.  She will continue losartan 25 mg daily.      Memory difficulty (Chronic)    Very mild.  She will monitor and if she has any worsening she will let us know.      Prediabetes (Chronic)    Check A1c.      Relevant Orders   HgB A1c   Vocal cord paresis (Chronic)    She will let me know if she does not hear from speech therapy on getting an appointment scheduled.      Cerebral microvascular disease    Discussed risk factor control.  Checking  labs today.        Health Maintenance: I will send a message to her neurologist just to make sure that it is okay for her to have the updated COVID-vaccine with her history of Guillain-Barr.  Discussed that this likely would be fine to do given that her Sharlyn Bologna was not vaccine related.  Return in about 6 months (around 01/16/2023).   Tommi Rumps, MD [place]  [facility]  

## 2022-07-17 NOTE — Telephone Encounter (Signed)
I called and spoke with the patient and informed her that the provider stated it was okay for her to get the latest covid vaccine and she understood.  Slate Debroux,cma

## 2022-07-17 NOTE — Telephone Encounter (Signed)
-----   Message from Alda Berthold, DO sent at 07/17/2022 11:27 AM EDT ----- [place],   Yes, she can get the new COVID vaccine. The data shows that mRNA COVID vaccines (Moderna, Pfizer)  have not been associated Guillain-Barre syndrome and are okay to take with patient who have history of GBS. [place] have vector vaccines which may pose a risk, so recommend avoiding it.   Hope that helps,  Donika  ----- Message ----- From: Leone Haven, MD Sent: 07/17/2022   8:56 AM EDT To: Alda Berthold, DO  Hi Donika,   I saw this patient for follow-up today. She wondered if it was ok for her to have the newest covid vaccine with her history of Guillain-Barr. I advised that it should be ok, though also advised that I would check with you first and let her know your thoughts. With her history is she ok to proceed with the updated covid vaccine? Thanks.   Randall Hiss

## 2022-07-17 NOTE — Patient Instructions (Signed)
Nice to see you. If you do not hear from gynecology in 1 to 2 weeks please let us know. I am going to contact your neurologist and we will let you know what she says about the updated COVID-vaccine. We will contact you with your lab results.

## 2022-07-17 NOTE — Assessment & Plan Note (Signed)
Check labs.  Discussed adequate control of this given microvascular changes on MRI.

## 2022-07-17 NOTE — Telephone Encounter (Signed)
Please let the patient know that her neurologist noted it would be okay for her to get the newest COVID vaccination.  She noted that the data shows that this type of vaccine has not been associated with Guillain-Barr.

## 2022-07-17 NOTE — Assessment & Plan Note (Signed)
Patient has improved significantly from her initial presentation.  She will continue to follow with her specialists on this.

## 2022-07-17 NOTE — Assessment & Plan Note (Signed)
Well-controlled for age.  She will continue losartan 25 mg daily.

## 2022-07-17 NOTE — Assessment & Plan Note (Signed)
Check A1c. 

## 2022-07-17 NOTE — Assessment & Plan Note (Signed)
Likely venous insufficiency though could also be related to her varicose veins.  Discussed compression stockings.  Discussed continued as needed use of Lasix.

## 2022-07-17 NOTE — Assessment & Plan Note (Signed)
Refer to a new gynecologist for her to continue follow-up with on this issue.

## 2022-07-17 NOTE — Assessment & Plan Note (Signed)
Discussed risk factor control.  Checking labs today.

## 2022-07-18 DIAGNOSIS — R2689 Other abnormalities of gait and mobility: Secondary | ICD-10-CM | POA: Diagnosis not present

## 2022-07-18 DIAGNOSIS — M6281 Muscle weakness (generalized): Secondary | ICD-10-CM | POA: Diagnosis not present

## 2022-07-18 DIAGNOSIS — M25562 Pain in left knee: Secondary | ICD-10-CM | POA: Diagnosis not present

## 2022-07-19 ENCOUNTER — Other Ambulatory Visit: Payer: Self-pay | Admitting: Family Medicine

## 2022-07-19 DIAGNOSIS — E785 Hyperlipidemia, unspecified: Secondary | ICD-10-CM

## 2022-07-19 MED ORDER — ROSUVASTATIN CALCIUM 20 MG PO TABS: 20.0000 mg | ORAL_TABLET | Freq: Every day | ORAL | 3 refills | Status: DC

## 2022-07-20 DIAGNOSIS — M25562 Pain in left knee: Secondary | ICD-10-CM | POA: Diagnosis not present

## 2022-07-20 DIAGNOSIS — M6281 Muscle weakness (generalized): Secondary | ICD-10-CM | POA: Diagnosis not present

## 2022-07-20 DIAGNOSIS — R2689 Other abnormalities of gait and mobility: Secondary | ICD-10-CM | POA: Diagnosis not present

## 2022-07-25 DIAGNOSIS — R2689 Other abnormalities of gait and mobility: Secondary | ICD-10-CM | POA: Diagnosis not present

## 2022-07-25 DIAGNOSIS — M25562 Pain in left knee: Secondary | ICD-10-CM | POA: Diagnosis not present

## 2022-07-25 DIAGNOSIS — M6281 Muscle weakness (generalized): Secondary | ICD-10-CM | POA: Diagnosis not present

## 2022-07-25 NOTE — Progress Notes (Signed)
Remote pacemaker transmission.   

## 2022-07-27 DIAGNOSIS — M25562 Pain in left knee: Secondary | ICD-10-CM | POA: Diagnosis not present

## 2022-07-27 DIAGNOSIS — R2689 Other abnormalities of gait and mobility: Secondary | ICD-10-CM | POA: Diagnosis not present

## 2022-07-27 DIAGNOSIS — M6281 Muscle weakness (generalized): Secondary | ICD-10-CM | POA: Diagnosis not present

## 2022-08-01 ENCOUNTER — Telehealth: Payer: Self-pay | Admitting: Family Medicine

## 2022-08-01 DIAGNOSIS — G610 Guillain-Barre syndrome: Secondary | ICD-10-CM | POA: Diagnosis not present

## 2022-08-01 DIAGNOSIS — K2100 Gastro-esophageal reflux disease with esophagitis, without bleeding: Secondary | ICD-10-CM | POA: Diagnosis not present

## 2022-08-01 DIAGNOSIS — R490 Dysphonia: Secondary | ICD-10-CM | POA: Diagnosis not present

## 2022-08-01 NOTE — Telephone Encounter (Signed)
Linsey from [facility] at [place], fax over therapy orders for patient on 07/19/2022. She is not sure if they were faxed to the correct number. Please fax to [phone]. Phone number is [phone].

## 2022-08-01 NOTE — Telephone Encounter (Signed)
Signed.  Please fax back. 

## 2022-08-02 ENCOUNTER — Other Ambulatory Visit (HOSPITAL_BASED_OUTPATIENT_CLINIC_OR_DEPARTMENT_OTHER): Payer: Self-pay | Admitting: Family Medicine

## 2022-08-02 ENCOUNTER — Ambulatory Visit (HOSPITAL_BASED_OUTPATIENT_CLINIC_OR_DEPARTMENT_OTHER): Payer: Medicare Other | Admitting: Radiology

## 2022-08-02 DIAGNOSIS — M25512 Pain in left shoulder: Secondary | ICD-10-CM

## 2022-08-02 NOTE — Telephone Encounter (Signed)
Order was faxed today. Confirmation given.  Doral Ventrella,cma

## 2022-08-04 ENCOUNTER — Encounter: Payer: Self-pay | Admitting: Neurology

## 2022-08-07 DIAGNOSIS — K2100 Gastro-esophageal reflux disease with esophagitis, without bleeding: Secondary | ICD-10-CM | POA: Diagnosis not present

## 2022-08-07 DIAGNOSIS — G610 Guillain-Barre syndrome: Secondary | ICD-10-CM | POA: Diagnosis not present

## 2022-08-07 DIAGNOSIS — R490 Dysphonia: Secondary | ICD-10-CM | POA: Diagnosis not present

## 2022-08-08 DIAGNOSIS — K2100 Gastro-esophageal reflux disease with esophagitis, without bleeding: Secondary | ICD-10-CM | POA: Diagnosis not present

## 2022-08-08 DIAGNOSIS — R490 Dysphonia: Secondary | ICD-10-CM | POA: Diagnosis not present

## 2022-08-08 DIAGNOSIS — G610 Guillain-Barre syndrome: Secondary | ICD-10-CM | POA: Diagnosis not present

## 2022-08-09 DIAGNOSIS — M1712 Unilateral primary osteoarthritis, left knee: Secondary | ICD-10-CM | POA: Diagnosis not present

## 2022-08-10 DIAGNOSIS — J029 Acute pharyngitis, unspecified: Secondary | ICD-10-CM | POA: Diagnosis not present

## 2022-08-10 DIAGNOSIS — Z6828 Body mass index (BMI) 28.0-28.9, adult: Secondary | ICD-10-CM | POA: Diagnosis not present

## 2022-08-10 DIAGNOSIS — J069 Acute upper respiratory infection, unspecified: Secondary | ICD-10-CM | POA: Diagnosis not present

## 2022-08-12 ENCOUNTER — Ambulatory Visit
Admission: EM | Admit: 2022-08-12 | Discharge: 2022-08-12 | Disposition: A | Discharge status: Home / Self Care | Payer: Medicare Other | Attending: Urgent Care | Admitting: Urgent Care

## 2022-08-12 DIAGNOSIS — U071 COVID-19: Secondary | ICD-10-CM

## 2022-08-12 MED ORDER — PAXLOVID (300/100) 20 X 150 MG & 10 X 100MG PO TBPK: ORAL_TABLET | ORAL | 0 refills | Status: DC

## 2022-08-12 NOTE — ED Triage Notes (Signed)
Pt. Presents to UC w/ c/o a fever, congestion, , fatigue and body aches for the past 2 days. Pt. States she tested positive for COVID this morning after a home test.

## 2022-08-12 NOTE — ED Provider Notes (Signed)
[facility]    CSN: [id number] Arrival date & time: 08/12/22  1050      History   Chief Complaint Chief Complaint  Patient presents with   Covid Positive   Nasal Congestion   Fever   Fatigue   Generalized Body Aches    HPI Alison Huffman is a 81 y.o. female.    Fever   Presents to UC with complaint of fever and congestion, fatigue and body aches x2 days.  She endorses positive COVID test this morning at home.  PMH significant for hypertension, prediabetes, CAD, CHF.  Past Medical History:  Diagnosis Date   Ankle pain, right    Atypical chest pain    a. 10/2018 MV: EF 57%. No ischemia/infarct.   Coronary artery disease    Diastolic dysfunction    a. 04/2018 Echo: EF 55-60%, no rwma, Gr1 DD. Triv AI, mild MR. Mod dil LA. Nl RV fxn. PASP nl.   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 07/2018 s/p SJM Assurity MRI model PM2271 (Ser# [id number]).   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   Bronchiectasis without complication ([place]) 68/34/1962   Guillain Barr syndrome ([place]) 04/18/2020   Chronic diastolic (congestive) heart failure ([place])    Coronary artery disease involving native coronary artery of native heart without angina pectoris    Inversion of right nipple 10/01/2019   Cyst of left ovary 01/15/2019   History of gastroesophageal reflux (GERD) 12/12/2018   Postmenopausal bleeding 09/30/2018   Thickened endometrium 09/30/2018   Bilateral shoulder pain 09/20/2018   Second degree AV block, Mobitz type II 07/22/2018   Dyspnea on exertion 03/21/2018   Greater trochanteric bursitis of both hips 12/18/2017   Psoriasis 09/27/2017   Prediabetes 09/19/2017   Stiffness of right hand joint 08/28/2017   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   Allergic rhinitis 06/14/2017   History of carotid stenosis 06/14/2017   Psoriatic arthritis ([place]) 22/97/9892   Lichen sclerosus    GERD (gastroesophageal reflux disease)    Hypertension 03/02/2017   Hyperlipidemia 03/02/2017   OSA (obstructive sleep apnea) 03/02/2017   Osteoporosis 03/02/2017    Past Surgical History:  Procedure Laterality Date   APPENDECTOMY     BREAST CYST ASPIRATION Bilateral    BREAST CYST EXCISION Right 1978  benign   COLONOSCOPY     COLONOSCOPY WITH PROPOFOL N/A 07/10/2019   Procedure: COLONOSCOPY WITH PROPOFOL;  Surgeon: Lollie Sails, MD;  [facility] END[facility]COPY;  Service: Endoscopy;  Laterality: N/A;   ESOPHAGOGASTRODUODENOSCOPY (EGD) WITH PROPOFOL N/A 07/10/2019   Procedure: ESOPHAGOGASTRODUODENOSCOPY (EGD) WITH PROPOFOL;  Surgeon: Lollie Sails, MD;  Location: [facility] ENDOSCOPY;  Service: Endoscopy;  Laterality: N/A;   HYSTEROSCOPY WITH D & C N/A 11/05/2018   Procedure: DILATATION AND CURETTAGE /HYSTEROSCOPY;  Surgeon: Homero Fellers, MD;  Location: [facility] ORS;  Service: Gynecology;  Laterality: N/A;    INSERT / REPLACE / REMOVE PACEMAKER     PACEMAKER IMPLANT N/A 07/23/2018   SJM Assurity [id number] implanted by Dr Rayann Heman for mobitz II second degree AV block   UTERINE POLYPS REMOVAL      OB History     Gravida  0   Para  0   Term  0   Preterm  0   AB  0   Living  0      SAB  0   IAB  0   Ectopic  0   Multiple  0   Live Births  0            Home Medications    Prior to Admission medications   Medication Sig Start Date End Date Taking? Authorizing Provider  acetaminophen (TYLENOL) 325 MG tablet Take 325-650 mg by mouth every 6 (six) hours as needed for mild pain or headache.     [provider]  clobetasol ointment (TEMOVATE) 0.05 %  03/08/99   [provider]  furosemide (LASIX) 20 MG tablet Take 1 tablet (20 mg total) by mouth daily as needed for edema. 09/01/21   Leone Haven, MD  hydrocerin (EUCERIN) CREA Apply 1 application topically 2 (two) times daily. To bilateral feet. 05/04/20   Love, Ivan Anchors, PA-C  ketoconazole (NIZORAL) 2 % cream Apply 1 application topically daily. 08/06/20   Leone Haven, MD  losartan (COZAAR) 25 MG tablet TAKE 1 TABLET BY MOUTH ONCE DAILY 04/17/22   Leone Haven, MD  methylcellulose (CITRUCEL) oral powder  01/31/20   [provider]  Naproxen Sodium (ALEVE PO) Take by mouth as needed.    [provider]  polyethylene glycol (MIRALAX / GLYCOLAX) 17 g packet Take 17 g by mouth daily as needed for severe constipation. 04/02/20   Vashti Hey, MD  rosuvastatin (CRESTOR) 20 MG tablet Take 1 tablet (20 mg total) by mouth daily. 07/19/22   Leone Haven, MD  tacrolimus (PROTOPIC) 0.1 % ointment Apply topically 2 (two) times daily as needed (for psoriasis).     [provider]  Vitamin D, Ergocalciferol, 50 MCG (2000 UT) CAPS Take by mouth.    [provider]    Family History Family History  Problem Relation Age of Onset   Stroke Mother    Atrial fibrillation  Mother    Breast cancer Mother    Stroke Father    Breast cancer Maternal Aunt    Breast cancer Paternal Aunt     Social History Social History   Tobacco Use   Smoking status: Never   Smokeless tobacco: Never   Tobacco comments:    07/22/2018 "smoked some in college; 1960s;  nothing since"  Vaping Use   Vaping Use: Never used  Substance Use Topics   Alcohol use: Yes    Comment: rarely   Drug use: Never  Allergies   Etanercept and Nickel   Review of Systems Review of Systems  Constitutional:  Positive for fever.     Physical Exam Triage Vital Signs ED Triage Vitals  Enc Vitals Group     BP 08/12/22 1210 (!) 153/77     Pulse Rate 08/12/22 1210 81     Resp 08/12/22 1210 18     Temp 08/12/22 1210 99 F (37.2 C)     Temp Source 08/12/22 1210 Oral     SpO2 08/12/22 1210 96 %     Weight --      Height --      Head Circumference --      Peak Flow --      Pain Score 08/12/22 1217 0     Pain Loc --      Pain Edu? --      Excl. in [place]? --    No data found.  Updated Vital Signs BP (!) 153/77 (BP Location: Left Arm)   Pulse 81   Temp 99 F (37.2 C) (Oral)   Resp 18   SpO2 96%   Visual Acuity Right Eye Distance:   Left Eye Distance:   Bilateral Distance:    Right Eye Near:   Left Eye Near:    Bilateral Near:     Physical Exam Vitals reviewed.  Constitutional:      General: She is not in acute distress.    Appearance: Normal appearance. She is ill-appearing.  HENT:     Head: Normocephalic.     Mouth/Throat:     Mouth: Mucous membranes are moist.     Pharynx: Oropharynx is clear.  Eyes:     Conjunctiva/sclera: Conjunctivae normal.     Pupils: Pupils are equal, round, and reactive to light.  Cardiovascular:     Rate and Rhythm: Normal rate. Rhythm irregular.     Pulses: Normal pulses.  Pulmonary:     Effort: Pulmonary effort is normal.     Breath sounds: Normal breath sounds.  Skin:    General: Skin is warm and dry.  Neurological:      General: No focal deficit present.     Mental Status: She is alert and oriented to person, place, and time.  Psychiatric:        Mood and Affect: Mood normal.        Behavior: Behavior normal.      UC Treatments / Results  Labs (all labs ordered are listed, but only abnormal results are displayed) Labs Reviewed - No data to display  EKG   Radiology No results found.  Procedures Procedures (including critical care time)  Medications Ordered in UC Medications - No data to display  Initial Impression / Assessment and Plan / UC Course  I have reviewed the triage vital signs and the nursing notes.  Pertinent labs & imaging results that were available during my care of the patient were reviewed by me and considered in my medical decision making (see chart for details).   Patient is afebrile here with recent antipyretics (tylenol). Satting well on room air. Overall is ill appearing but not in distress, well hydrated, without respiratory distress. Pulmonary exam is unremarkable.  She denies any nausea, vomiting, diarrhea so not currently at increased risk of dehydration.  Given her significant comorbidities, she is at risk for poor outcome due to COVID and will recommend use of antiviral treatment to shorten duration of the virus.  Recent labs indicate normal GFR.  Final Clinical Impressions(s) / UC Diagnoses   Final diagnoses:  None   Discharge Instructions   None    ED Prescriptions   None    PDMP not reviewed this encounter.   Rose Phi, [place] 08/12/22 1228

## 2022-08-12 NOTE — Discharge Instructions (Addendum)
Follow up here or with your primary care provider if your symptoms are worsening or not improving with treatment.     

## 2022-08-21 ENCOUNTER — Ambulatory Visit: Payer: Medicare Other

## 2022-08-21 DIAGNOSIS — G610 Guillain-Barre syndrome: Secondary | ICD-10-CM | POA: Diagnosis not present

## 2022-08-21 DIAGNOSIS — R490 Dysphonia: Secondary | ICD-10-CM | POA: Diagnosis not present

## 2022-08-21 DIAGNOSIS — K2100 Gastro-esophageal reflux disease with esophagitis, without bleeding: Secondary | ICD-10-CM | POA: Diagnosis not present

## 2022-08-22 DIAGNOSIS — K2100 Gastro-esophageal reflux disease with esophagitis, without bleeding: Secondary | ICD-10-CM | POA: Diagnosis not present

## 2022-08-22 DIAGNOSIS — G610 Guillain-Barre syndrome: Secondary | ICD-10-CM | POA: Diagnosis not present

## 2022-08-22 DIAGNOSIS — R490 Dysphonia: Secondary | ICD-10-CM | POA: Diagnosis not present

## 2022-08-28 ENCOUNTER — Other Ambulatory Visit: Payer: Medicare Other

## 2022-08-29 DIAGNOSIS — R490 Dysphonia: Secondary | ICD-10-CM | POA: Diagnosis not present

## 2022-08-29 DIAGNOSIS — K2100 Gastro-esophageal reflux disease with esophagitis, without bleeding: Secondary | ICD-10-CM | POA: Diagnosis not present

## 2022-08-29 DIAGNOSIS — G610 Guillain-Barre syndrome: Secondary | ICD-10-CM | POA: Diagnosis not present

## 2022-08-30 DIAGNOSIS — L720 Epidermal cyst: Secondary | ICD-10-CM | POA: Diagnosis not present

## 2022-08-30 DIAGNOSIS — L900 Lichen sclerosus et atrophicus: Secondary | ICD-10-CM | POA: Diagnosis not present

## 2022-08-30 DIAGNOSIS — D2272 Melanocytic nevi of left lower limb, including hip: Secondary | ICD-10-CM | POA: Diagnosis not present

## 2022-08-30 DIAGNOSIS — D2261 Melanocytic nevi of right upper limb, including shoulder: Secondary | ICD-10-CM | POA: Diagnosis not present

## 2022-08-30 DIAGNOSIS — L400 Psoriasis vulgaris: Secondary | ICD-10-CM | POA: Diagnosis not present

## 2022-08-30 DIAGNOSIS — L821 Other seborrheic keratosis: Secondary | ICD-10-CM | POA: Diagnosis not present

## 2022-09-01 DIAGNOSIS — N83202 Unspecified ovarian cyst, left side: Secondary | ICD-10-CM | POA: Diagnosis not present

## 2022-09-04 DIAGNOSIS — G610 Guillain-Barre syndrome: Secondary | ICD-10-CM | POA: Diagnosis not present

## 2022-09-04 DIAGNOSIS — K2100 Gastro-esophageal reflux disease with esophagitis, without bleeding: Secondary | ICD-10-CM | POA: Diagnosis not present

## 2022-09-04 DIAGNOSIS — R490 Dysphonia: Secondary | ICD-10-CM | POA: Diagnosis not present

## 2022-09-07 DIAGNOSIS — R490 Dysphonia: Secondary | ICD-10-CM | POA: Diagnosis not present

## 2022-09-07 DIAGNOSIS — B351 Tinea unguium: Secondary | ICD-10-CM | POA: Diagnosis not present

## 2022-09-07 DIAGNOSIS — K2100 Gastro-esophageal reflux disease with esophagitis, without bleeding: Secondary | ICD-10-CM | POA: Diagnosis not present

## 2022-09-07 DIAGNOSIS — I7091 Generalized atherosclerosis: Secondary | ICD-10-CM | POA: Diagnosis not present

## 2022-09-07 DIAGNOSIS — G610 Guillain-Barre syndrome: Secondary | ICD-10-CM | POA: Diagnosis not present

## 2022-09-11 DIAGNOSIS — G610 Guillain-Barre syndrome: Secondary | ICD-10-CM | POA: Diagnosis not present

## 2022-09-11 DIAGNOSIS — R490 Dysphonia: Secondary | ICD-10-CM | POA: Diagnosis not present

## 2022-09-11 DIAGNOSIS — K2100 Gastro-esophageal reflux disease with esophagitis, without bleeding: Secondary | ICD-10-CM | POA: Diagnosis not present

## 2022-09-12 LAB — BASIC METABOLIC PANEL
CO2: 29 — AB (ref 13–22)
Chloride: 111 — AB (ref 99–108)
Creatinine: 0.6 (ref 0.5–1.1)
Glucose: 97
Potassium: 4.5 mEq/L (ref 3.5–5.1)
Sodium: 142 (ref 137–147)

## 2022-09-12 LAB — HEPATIC FUNCTION PANEL
ALT: 18 U/L (ref 7–35)
AST: 15 (ref 13–35)
Alkaline Phosphatase: 113 (ref 25–125)
Bilirubin, Total: 0.5

## 2022-09-12 LAB — CBC AND DIFFERENTIAL
HCT: 37 (ref 36–46)
Hemoglobin: 12.6 (ref 12.0–16.0)
Platelets: 227 10*3/uL (ref 150–400)
WBC: 5.4

## 2022-09-12 LAB — CBC: RBC: 4.07 (ref 3.87–5.11)

## 2022-09-12 LAB — COMPREHENSIVE METABOLIC PANEL
Albumin: 3.5 (ref 3.5–5.0)
Calcium: 9 (ref 8.7–10.7)

## 2022-09-12 LAB — TSH: TSH: 1.22 (ref 0.41–5.90)

## 2022-09-14 DIAGNOSIS — K2100 Gastro-esophageal reflux disease with esophagitis, without bleeding: Secondary | ICD-10-CM | POA: Diagnosis not present

## 2022-09-14 DIAGNOSIS — R490 Dysphonia: Secondary | ICD-10-CM | POA: Diagnosis not present

## 2022-09-14 DIAGNOSIS — G610 Guillain-Barre syndrome: Secondary | ICD-10-CM | POA: Diagnosis not present

## 2022-09-18 DIAGNOSIS — G610 Guillain-Barre syndrome: Secondary | ICD-10-CM | POA: Diagnosis not present

## 2022-09-18 DIAGNOSIS — R490 Dysphonia: Secondary | ICD-10-CM | POA: Diagnosis not present

## 2022-09-18 DIAGNOSIS — K2100 Gastro-esophageal reflux disease with esophagitis, without bleeding: Secondary | ICD-10-CM | POA: Diagnosis not present

## 2022-09-21 DIAGNOSIS — R490 Dysphonia: Secondary | ICD-10-CM | POA: Diagnosis not present

## 2022-09-21 DIAGNOSIS — K2100 Gastro-esophageal reflux disease with esophagitis, without bleeding: Secondary | ICD-10-CM | POA: Diagnosis not present

## 2022-09-21 DIAGNOSIS — G610 Guillain-Barre syndrome: Secondary | ICD-10-CM | POA: Diagnosis not present

## 2022-09-22 ENCOUNTER — Other Ambulatory Visit: Payer: Self-pay | Admitting: Family Medicine

## 2022-09-22 DIAGNOSIS — I5032 Chronic diastolic (congestive) heart failure: Secondary | ICD-10-CM

## 2022-09-22 DIAGNOSIS — R600 Localized edema: Secondary | ICD-10-CM

## 2022-10-03 DIAGNOSIS — R490 Dysphonia: Secondary | ICD-10-CM | POA: Diagnosis not present

## 2022-10-03 DIAGNOSIS — G610 Guillain-Barre syndrome: Secondary | ICD-10-CM | POA: Diagnosis not present

## 2022-10-03 DIAGNOSIS — K2100 Gastro-esophageal reflux disease with esophagitis, without bleeding: Secondary | ICD-10-CM | POA: Diagnosis not present

## 2022-10-09 DIAGNOSIS — R490 Dysphonia: Secondary | ICD-10-CM | POA: Diagnosis not present

## 2022-10-09 DIAGNOSIS — G610 Guillain-Barre syndrome: Secondary | ICD-10-CM | POA: Diagnosis not present

## 2022-10-09 DIAGNOSIS — K2100 Gastro-esophageal reflux disease with esophagitis, without bleeding: Secondary | ICD-10-CM | POA: Diagnosis not present

## 2022-10-11 ENCOUNTER — Ambulatory Visit (INDEPENDENT_AMBULATORY_CARE_PROVIDER_SITE_OTHER): Payer: Medicare Other

## 2022-10-11 ENCOUNTER — Ambulatory Visit: Payer: Medicare Other | Attending: Cardiology | Admitting: Cardiology

## 2022-10-11 ENCOUNTER — Encounter: Payer: Self-pay | Admitting: Cardiology

## 2022-10-11 VITALS — BP 138/72 | HR 69 | Ht 66.0 in | Wt 183.0 lb

## 2022-10-11 DIAGNOSIS — Z950 Presence of cardiac pacemaker: Secondary | ICD-10-CM

## 2022-10-11 DIAGNOSIS — I441 Atrioventricular block, second degree: Secondary | ICD-10-CM | POA: Diagnosis not present

## 2022-10-11 DIAGNOSIS — I5032 Chronic diastolic (congestive) heart failure: Secondary | ICD-10-CM

## 2022-10-11 LAB — CUP PACEART REMOTE DEVICE CHECK
Battery Remaining Longevity: 87 mo
Battery Remaining Percentage: 66 %
Battery Voltage: 2.99 V
Brady Statistic AP VP Percent: 1 %
Brady Statistic AP VS Percent: 6.3 %
Brady Statistic AS VP Percent: 12 %
Brady Statistic AS VS Percent: 79 %
Brady Statistic RA Percent Paced: 5 %
Brady Statistic RV Percent Paced: 13 %
Date Time Interrogation Session: 20240110025836
Implantable Lead Connection Status: 753985
Implantable Lead Connection Status: 753985
Implantable Lead Implant Date: 20191022
Implantable Lead Implant Date: 20191022
Implantable Lead Location: 753859
Implantable Lead Location: 753860
Implantable Pulse Generator Implant Date: 20191022
Lead Channel Impedance Value: 560 Ohm
Lead Channel Impedance Value: 750 Ohm
Lead Channel Pacing Threshold Amplitude: 0.5 V
Lead Channel Pacing Threshold Amplitude: 0.75 V
Lead Channel Pacing Threshold Pulse Width: 0.5 ms
Lead Channel Pacing Threshold Pulse Width: 0.5 ms
Lead Channel Sensing Intrinsic Amplitude: 12 mV
Lead Channel Sensing Intrinsic Amplitude: 3.8 mV
Lead Channel Setting Pacing Amplitude: 1 V
Lead Channel Setting Pacing Amplitude: 2 V
Lead Channel Setting Pacing Pulse Width: 0.5 ms
Lead Channel Setting Sensing Sensitivity: 2 mV
Pulse Gen Model: 2272
Pulse Gen Serial Number: 9073848

## 2022-10-11 LAB — CUP PACEART INCLINIC DEVICE CHECK
Battery Remaining Longevity: 91 mo
Battery Voltage: 2.99 V
Brady Statistic RA Percent Paced: 5 %
Brady Statistic RV Percent Paced: 13 %
Date Time Interrogation Session: 20240110141403
Implantable Lead Connection Status: 753985
Implantable Lead Connection Status: 753985
Implantable Lead Implant Date: 20191022
Implantable Lead Implant Date: 20191022
Implantable Lead Location: 753859
Implantable Lead Location: 753860
Implantable Pulse Generator Implant Date: 20191022
Lead Channel Impedance Value: 600 Ohm
Lead Channel Impedance Value: 737.5 Ohm
Lead Channel Pacing Threshold Amplitude: 0.5 V
Lead Channel Pacing Threshold Amplitude: 0.5 V
Lead Channel Pacing Threshold Amplitude: 0.75 V
Lead Channel Pacing Threshold Amplitude: 0.75 V
Lead Channel Pacing Threshold Pulse Width: 0.5 ms
Lead Channel Pacing Threshold Pulse Width: 0.5 ms
Lead Channel Pacing Threshold Pulse Width: 0.5 ms
Lead Channel Pacing Threshold Pulse Width: 0.5 ms
Lead Channel Sensing Intrinsic Amplitude: 12 mV
Lead Channel Sensing Intrinsic Amplitude: 5 mV
Lead Channel Setting Pacing Amplitude: 0.875
Lead Channel Setting Pacing Amplitude: 2 V
Lead Channel Setting Pacing Pulse Width: 0.5 ms
Lead Channel Setting Sensing Sensitivity: 2 mV
Pulse Gen Model: 2272
Pulse Gen Serial Number: 9073848

## 2022-10-11 LAB — PACEMAKER DEVICE OBSERVATION

## 2022-10-11 NOTE — Patient Instructions (Signed)
Medication Instructions:  Your physician recommends that you continue on your current medications as directed. Please refer to the Current Medication list given to you today.  *If you need a refill on your cardiac medications before your next appointment, please call your pharmacy*   Lab Work: None ordered.  If you have labs (blood work) drawn today and your tests are completely normal, you will receive your results only by: [place] (if you have MyChart) OR A paper copy in the mail If you have any lab test that is abnormal or we need to change your treatment, we will call you to review the results.   Testing/Procedures: None ordered.    Follow-Up: At [facility], you and your health needs are our priority.  As part of our continuing mission to provide you with exceptional heart care, we have created designated Provider Care Teams.  These Care Teams include your primary Cardiologist (physician) and Advanced Practice Providers (APPs -  Physician Assistants and Nurse Practitioners) who all work together to provide you with the care you need, when you need it.  We recommend signing up for the patient portal called "MyChart".  Sign up information is provided on this After Visit Summary.  MyChart is used to connect with patients for Virtual Visits (Telemedicine).  Patients are able to view lab/test results, encounter notes, upcoming appointments, etc.  Non-urgent messages can be sent to your provider as well.   To learn more about what you can do with MyChart, go to [email or web address].    Your next appointment:   12 months with Dr Mardene Speak APP Nunzio Cobbs   Important Information About Sugar

## 2022-10-11 NOTE — Progress Notes (Signed)
Electrophysiology Office Follow up Visit Note:    Date:  10/11/2022   ID:  Alison Huffman, DOB 08-30-1941, MRN [id number]  PCP:  Leone Haven, MD  [place] Cardiologist:  None  [facility] Electrophysiologist:  Vickie Epley, MD    Interval History:    Alison Huffman is a 82 y.o. female who presents for a follow up visit. They were last seen in clinic October 26, 2021.  He has a pacemaker that was implanted July 23, 2018.  She presents for her routine follow-up. She has been doing well since PPM implant. No problems with the PPM pocket.        Past Medical History:  Diagnosis Date   Ankle pain, right    Atypical chest pain    a. 10/2018 MV: EF 57%. No ischemia/infarct.   Coronary artery disease    Diastolic dysfunction    a. 04/2018 Echo: EF 55-60%, no rwma, Gr1 DD. Triv AI, mild MR. Mod dil LA. Nl RV fxn. PASP nl.   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 X 1   Presence of permanent cardiac pacemaker 07/23/2018   Psoriasis    Psoriatic arthritis ([place])    " dx'd the 1st of this year, 2019" (07/22/2018)   Psoriatic arthritis ([place])    Rosacea    Seborrheic keratosis    Second degree heart block    a. 07/2018 s/p SJM Assurity MRI model PM2271 (Ser# [id number]).   Sleep apnea    "using nasal pilllows since ~ 12/2017" (07/22/2018)   TMJ (dislocation of temporomandibular joint)     Past Surgical History:  Procedure Laterality Date   APPENDECTOMY     BREAST CYST ASPIRATION Bilateral    BREAST CYST EXCISION Right 1978   benign   COLONOSCOPY     COLONOSCOPY WITH PROPOFOL N/A 07/10/2019   Procedure: COLONOSCOPY WITH PROPOFOL;  Surgeon: Lollie Sails, MD;  Location[facility]  ENDOSCOPY;  Service: Endoscopy;  Laterality: N/A;   ESOPHAGOGASTRODUODENOSCOPY (EGD) WITH PROPOFOL N/A 07/10/2019   Procedure: ESOPHAGOGASTRODUODENOSCOPY (EGD) WITH PROPOFOL;  Surgeon: Lollie Sails, MD;  Location: [facility] ENDOSCOPY;  Service: Endoscopy;  Laterality: N/A;   HYSTEROSCOPY WITH D & C N/A 11/05/2018   Procedure: DILATATION AND CURETTAGE /HYSTEROSCOPY;  Surgeon: Homero Fellers, MD;  Location: [facility] ORS;  Service: Gynecology;  Laterality: N/A;   INSERT / REPLACE / REMOVE PACEMAKER     PACEMAKER IMPLANT N/A 07/23/2018   SJM Assurity 2272 implanted by Dr Rayann Heman for mobitz II second degree AV block   UTERINE POLYPS REMOVAL      Current Medications: Current Meds  Medication Sig   acetaminophen (TYLENOL) 325 MG tablet Take 325-650 mg by mouth every 6 (six) hours as needed for mild pain or headache.    Cholecalciferol 50 MCG (2000 UT) TABS Take 1 tablet by mouth daily.   clobetasol ointment (TEMOVATE) 0.05 %    furosemide (LASIX) 20 MG tablet TAKE ONE TABLET BY MOUTH EVERY DAY AS NEEDED FOR EDEMA   hydrocerin (EUCERIN) CREA Apply 1 application topically 2 (two) times daily. To bilateral feet.   ketoconazole (NIZORAL) 2 % cream Apply 1 application  topically daily.   losartan (COZAAR) 25 MG tablet TAKE 1 TABLET BY MOUTH ONCE DAILY   methylcellulose (CITRUCEL) oral powder    Naproxen Sodium (ALEVE PO) Take by mouth as needed.   polyethylene glycol (MIRALAX / GLYCOLAX) 17 g packet Take 17 g by mouth daily as needed for severe constipation.   rosuvastatin (CRESTOR) 20 MG tablet Take 1 tablet (20 mg total) by mouth daily.   tacrolimus (PROTOPIC) 0.1 % ointment Apply topically 2 (two) times daily as needed (for psoriasis).    Vitamin D, Ergocalciferol, 50 MCG (2000 UT) CAPS Take by mouth.     Allergies:   Etanercept and Nickel   Social History   Socioeconomic History   Marital status: Single    Spouse name: Not on file   Number of children: Not on file   Years of education:  Not on file   Highest education level: Not on file  Occupational History   Not on file  Tobacco Use   Smoking status: Never   Smokeless tobacco: Never   Tobacco comments:    07/22/2018 "smoked some in college; 1960s;  nothing since"  Vaping Use   Vaping Use: Never used  Substance and Sexual Activity   Alcohol use: Yes    Comment: rarely   Drug use: Never   Sexual activity: Not Currently    Birth control/protection: Post-menopausal  Other Topics Concern   Not on file  Social History Narrative   Right Handed   Lives in an apartment. 5th floor but has elevator    Drinks Caffeine.    Social Determinants of Health   Financial Resource Strain: Low Risk  (11/09/2021)   Overall Financial Resource Strain (CARDIA)    Difficulty of Paying Living Expenses: Not hard at all  Food Insecurity: No Food Insecurity (11/09/2021)   Hunger Vital Sign    Worried About Running Out of Food in the Last Year: Never true    Ran Out of Food in the Last Year: Never true  Transportation Needs: No Transportation Needs (11/09/2021)   PRAPARE - Hydrologist (Medical): No    Lack of Transportation (Non-Medical): No  Physical Activity: Sufficiently Active (11/09/2021)   Exercise Vital Sign    Days of Exercise per Week: 4 days    Minutes of Exercise per Session: 60 min  Stress: No Stress Concern Present (11/09/2021)   [place]    Feeling of Stress : Not at all  Social Connections: Unknown (11/09/2021)   Social Connection and Isolation Panel [NHANES]    Frequency of Communication with Friends and Family: More than three times a week    Frequency of Social Gatherings with Friends and Family: Not on file    Attends Religious Services: Not on file    Active Member of Clubs or Organizations: Not on file    Attends Archivist Meetings: Not on file    Marital Status: Not on file     Family History: The patient's  family history includes Atrial fibrillation in her mother; Breast cancer in her maternal aunt, mother, and paternal aunt; Stroke in her father and mother.  ROS:   Please see the history of present illness.    All other systems reviewed and are negative.  EKGs/Labs/Other Studies Reviewed:    The following studies were reviewed today:  October 11, 2022 in clinic device interrogation personally reviewed Battery longevity 7.6 years Lead parameter stable 5% atrial  paced 13% ventricular paced 33 AMS episodes with the longest lasting 1 minute which was EMI from a MRI  Recent Labs: 12/22/2021: Hemoglobin 12.6; Platelets 232 07/17/2022: ALT 12; BUN 12; Creatinine, Ser 0.53; Potassium 4.2; Sodium 140  Recent Lipid Panel    Component Value Date/Time   CHOL 227 (H) 07/17/2022 0858   CHOL 156 04/11/2018 0900   TRIG 88.0 07/17/2022 0858   HDL 53.20 07/17/2022 0858   HDL 58 04/11/2018 0900   CHOLHDL 4 07/17/2022 0858   VLDL 17.6 07/17/2022 0858   LDLCALC 157 (H) 07/17/2022 0858   LDLCALC 84 04/11/2018 0900   LDLDIRECT 94.0 10/06/2019 0906    Physical Exam:    VS:  BP 138/72   Pulse 69   Ht '5\' 6"'$  (1.676 m)   Wt 183 lb (83 kg)   SpO2 97%   BMI 29.54 kg/m     Wt Readings from Last 3 Encounters:  10/11/22 183 lb (83 kg)  07/17/22 181 lb (82.1 kg)  06/28/22 181 lb (82.1 kg)     GEN:  Well nourished, well developed in no acute distress HEENT: Normal NECK: No JVD; No carotid bruits LYMPHATICS: No lymphadenopathy CARDIAC: RRR, no murmurs, rubs, gallops.  Pacemaker pocket well-healed RESPIRATORY:  Clear to auscultation without rales, wheezing or rhonchi  ABDOMEN: Soft, non-tender, non-distended MUSCULOSKELETAL:  No edema; No deformity  SKIN: Warm and dry NEUROLOGIC:  Alert and oriented x 3 PSYCHIATRIC:  Normal affect        ASSESSMENT:    1. Cardiac pacemaker in situ   2. Second degree AV block, Mobitz type II   3. Chronic diastolic congestive heart failure (HCC)     PLAN:    In order of problems listed above:  #Second-degree AV block, Mobitz 2 #Pacemaker in situ Pacemaker functioning appropriately.  Continue remote monitoring  #Chronic diastolic heart failure NYHA class II.  Warm and dry on exam.  Continue home meds.  Follow-up 1 year with APP.      Medication Adjustments/Labs and Tests Ordered: Current medicines are reviewed at length with the patient today.  Concerns regarding medicines are outlined above.  No orders of the defined types were placed in this encounter.  No orders of the defined types were placed in this encounter.    Signed, Lars Mage, MD, [facility], [facility] 10/11/2022 9:50 AM    Electrophysiology [facility]

## 2022-10-12 DIAGNOSIS — R490 Dysphonia: Secondary | ICD-10-CM | POA: Diagnosis not present

## 2022-10-12 DIAGNOSIS — G610 Guillain-Barre syndrome: Secondary | ICD-10-CM | POA: Diagnosis not present

## 2022-10-12 DIAGNOSIS — K2100 Gastro-esophageal reflux disease with esophagitis, without bleeding: Secondary | ICD-10-CM | POA: Diagnosis not present

## 2022-10-16 DIAGNOSIS — G610 Guillain-Barre syndrome: Secondary | ICD-10-CM | POA: Diagnosis not present

## 2022-10-16 DIAGNOSIS — K2100 Gastro-esophageal reflux disease with esophagitis, without bleeding: Secondary | ICD-10-CM | POA: Diagnosis not present

## 2022-10-16 DIAGNOSIS — R490 Dysphonia: Secondary | ICD-10-CM | POA: Diagnosis not present

## 2022-10-19 DIAGNOSIS — R490 Dysphonia: Secondary | ICD-10-CM | POA: Diagnosis not present

## 2022-10-19 DIAGNOSIS — G610 Guillain-Barre syndrome: Secondary | ICD-10-CM | POA: Diagnosis not present

## 2022-10-19 DIAGNOSIS — K2100 Gastro-esophageal reflux disease with esophagitis, without bleeding: Secondary | ICD-10-CM | POA: Diagnosis not present

## 2022-10-24 DIAGNOSIS — K2100 Gastro-esophageal reflux disease with esophagitis, without bleeding: Secondary | ICD-10-CM | POA: Diagnosis not present

## 2022-10-24 DIAGNOSIS — G610 Guillain-Barre syndrome: Secondary | ICD-10-CM | POA: Diagnosis not present

## 2022-10-24 DIAGNOSIS — R490 Dysphonia: Secondary | ICD-10-CM | POA: Diagnosis not present

## 2022-10-25 ENCOUNTER — Telehealth: Payer: Self-pay | Admitting: Cardiovascular Disease

## 2022-10-25 NOTE — Telephone Encounter (Signed)
Calling to see if its okay that she use a compression pump for her legs. Please advise

## 2022-10-25 NOTE — Telephone Encounter (Signed)
Left message to call back.   Minna Merritts, MD  You29 minutes ago (2:50 PM)    Compression pumps would be fine Thx TG

## 2022-10-25 NOTE — Telephone Encounter (Signed)
Pt made aware and verbalized understanding.

## 2022-10-26 DIAGNOSIS — R490 Dysphonia: Secondary | ICD-10-CM | POA: Diagnosis not present

## 2022-10-26 DIAGNOSIS — K2100 Gastro-esophageal reflux disease with esophagitis, without bleeding: Secondary | ICD-10-CM | POA: Diagnosis not present

## 2022-10-26 DIAGNOSIS — G610 Guillain-Barre syndrome: Secondary | ICD-10-CM | POA: Diagnosis not present

## 2022-10-30 ENCOUNTER — Telehealth: Payer: Self-pay | Admitting: Family Medicine

## 2022-10-30 DIAGNOSIS — K2100 Gastro-esophageal reflux disease with esophagitis, without bleeding: Secondary | ICD-10-CM | POA: Diagnosis not present

## 2022-10-30 DIAGNOSIS — G610 Guillain-Barre syndrome: Secondary | ICD-10-CM | POA: Diagnosis not present

## 2022-10-30 DIAGNOSIS — R490 Dysphonia: Secondary | ICD-10-CM | POA: Diagnosis not present

## 2022-10-30 NOTE — Telephone Encounter (Signed)
Pt called stating she has numbness in her legs and feet. sent to access nurse

## 2022-10-31 DIAGNOSIS — R9389 Abnormal findings on diagnostic imaging of other specified body structures: Secondary | ICD-10-CM | POA: Diagnosis not present

## 2022-10-31 DIAGNOSIS — N83202 Unspecified ovarian cyst, left side: Secondary | ICD-10-CM | POA: Diagnosis not present

## 2022-11-01 NOTE — Telephone Encounter (Signed)
I called the patient and received no answer.  Alison Huffman,cma

## 2022-11-02 DIAGNOSIS — K2100 Gastro-esophageal reflux disease with esophagitis, without bleeding: Secondary | ICD-10-CM | POA: Diagnosis not present

## 2022-11-02 DIAGNOSIS — R490 Dysphonia: Secondary | ICD-10-CM | POA: Diagnosis not present

## 2022-11-02 DIAGNOSIS — G610 Guillain-Barre syndrome: Secondary | ICD-10-CM | POA: Diagnosis not present

## 2022-11-02 NOTE — Progress Notes (Signed)
Remote pacemaker transmission.   

## 2022-11-06 DIAGNOSIS — R490 Dysphonia: Secondary | ICD-10-CM | POA: Diagnosis not present

## 2022-11-06 DIAGNOSIS — K2100 Gastro-esophageal reflux disease with esophagitis, without bleeding: Secondary | ICD-10-CM | POA: Diagnosis not present

## 2022-11-06 DIAGNOSIS — G610 Guillain-Barre syndrome: Secondary | ICD-10-CM | POA: Diagnosis not present

## 2022-11-09 DIAGNOSIS — R490 Dysphonia: Secondary | ICD-10-CM | POA: Diagnosis not present

## 2022-11-09 DIAGNOSIS — G610 Guillain-Barre syndrome: Secondary | ICD-10-CM | POA: Diagnosis not present

## 2022-11-09 DIAGNOSIS — K2100 Gastro-esophageal reflux disease with esophagitis, without bleeding: Secondary | ICD-10-CM | POA: Diagnosis not present

## 2022-11-10 ENCOUNTER — Telehealth: Payer: Self-pay | Admitting: Family Medicine

## 2022-11-10 ENCOUNTER — Encounter: Payer: Self-pay | Admitting: Neurology

## 2022-11-10 NOTE — Telephone Encounter (Signed)
Pt sched for eval Monday

## 2022-11-10 NOTE — Telephone Encounter (Signed)
Patient is requesting a referral to [facility].

## 2022-11-13 ENCOUNTER — Ambulatory Visit (INDEPENDENT_AMBULATORY_CARE_PROVIDER_SITE_OTHER): Payer: Medicare Other

## 2022-11-13 ENCOUNTER — Ambulatory Visit (INDEPENDENT_AMBULATORY_CARE_PROVIDER_SITE_OTHER): Payer: Medicare Other | Admitting: Family Medicine

## 2022-11-13 VITALS — Ht 66.0 in | Wt 183.0 lb

## 2022-11-13 VITALS — BP 120/72 | HR 70 | Temp 98.0°F | Ht 66.0 in | Wt 182.6 lb

## 2022-11-13 DIAGNOSIS — I10 Essential (primary) hypertension: Secondary | ICD-10-CM | POA: Diagnosis not present

## 2022-11-13 DIAGNOSIS — I5032 Chronic diastolic (congestive) heart failure: Secondary | ICD-10-CM

## 2022-11-13 DIAGNOSIS — Z0000 Encounter for general adult medical examination without abnormal findings: Secondary | ICD-10-CM

## 2022-11-13 DIAGNOSIS — R600 Localized edema: Secondary | ICD-10-CM

## 2022-11-13 NOTE — Assessment & Plan Note (Signed)
Chronic issue.  Generally well-controlled.  She will continue losartan 25 mg daily.

## 2022-11-13 NOTE — Assessment & Plan Note (Addendum)
Chronic issue. I suspect the patient's symptoms are related to venous insufficiency or her CHF.  Discussed continuing compression stockings.  Discussed using Lasix daily as needed for swelling.  Lymphedema would seem to be less likely and I am not sure she would benefit from compression pumps or OT for lymphedema at this time.  Discussed elevating her legs is much as possible and continuing compression stockings.

## 2022-11-13 NOTE — Assessment & Plan Note (Signed)
Chronic issue.  I suspect the patient had some fluid accumulation last week that was contributing to her shortness of breath.  This seems to have returned back to its baseline after using a dose of Lasix.  She can continue Lasix 20 mg daily as needed for swelling or shortness of breath.

## 2022-11-13 NOTE — Progress Notes (Signed)
Subjective:   Alison Huffman is a 82 y.o. female who presents for Medicare Annual (Subsequent) preventive examination.  Review of Systems    No ROS.  Medicare Wellness Virtual Visit.  Visual/audio telehealth visit, UTA vital signs.   See social history for additional risk factors.   Cardiac Risk Factors include: advanced age (>42mn, >>17women)     Objective:    Today's Vitals   11/13/22 0902  Weight: 183 lb (83 kg)  Height: 5' 6"$  (1.676 m)   Body mass index is 29.54 kg/m.     05/19/2022   10:52 AM 11/28/2021    1:00 PM 11/09/2021   10:49 AM 07/01/2021    1:50 PM 05/18/2021    9:48 AM 12/03/2020    1:07 PM 11/17/2020    2:18 PM  Advanced Directives  Does Patient Have a Medical Advance Directive? Yes No Yes Yes Yes Yes Yes  Type of AParamedicof ADouble OakLiving will;Out of facility DNR (pink MOST or yellow form)  [place]  [place] (pink MOST or yellow form);Living will [place]Living will;Out of facility DNR (pink MOST or yellow form) Living will;Healthcare Power of Attorney  Does patient want to make changes to medical advance directive?   No - Patient declined No - Patient declined   Yes (ED - Information included in AVS)  Copy of [place] Chart?   Yes - validated most recent copy scanned in chart (See row information)        Current Medications (verified) Outpatient Encounter Medications as of 11/13/2022  Medication Sig   acetaminophen (TYLENOL) 325 MG tablet Take 325-650 mg by mouth every 6 (six) hours as needed for mild pain or headache.    Cholecalciferol 50 MCG (2000 UT) TABS Take 1 tablet by mouth daily.   clobetasol ointment (TEMOVATE) 0.05 %    furosemide (LASIX) 20 MG tablet TAKE ONE TABLET BY MOUTH EVERY DAY AS NEEDED FOR EDEMA   hydrocerin (EUCERIN) CREA Apply 1 application topically 2 (two) times daily. To bilateral feet.    ketoconazole (NIZORAL) 2 % cream Apply 1 application topically daily.   losartan (COZAAR) 25 MG tablet TAKE 1 TABLET BY MOUTH ONCE DAILY   methylcellulose (CITRUCEL) oral powder    Naproxen Sodium (ALEVE PO) Take by mouth as needed.   polyethylene glycol (MIRALAX / GLYCOLAX) 17 g packet Take 17 g by mouth daily as needed for severe constipation.   rosuvastatin (CRESTOR) 20 MG tablet Take 1 tablet (20 mg total) by mouth daily.   tacrolimus (PROTOPIC) 0.1 % ointment Apply topically 2 (two) times daily as needed (for psoriasis).    Vitamin D, Ergocalciferol, 50 MCG (2000 UT) CAPS Take by mouth.   No facility-administered encounter medications on file as of 11/13/2022.    Allergies (verified) Etanercept and Nickel   History: Past Medical History:  Diagnosis Date   Ankle pain, right    Atypical chest pain    a. 10/2018 MV: EF 57%. No ischemia/infarct.   Coronary artery disease    Diastolic dysfunction    a. 04/2018 Echo: EF 55-60%, no rwma, Gr1 DD. Triv AI, mild MR. Mod dil LA. Nl RV fxn. PASP nl.   GERD (gastroesophageal reflux disease)    Guillain-Barre (HCC)    Headache    "dull one sometimes daily, at least weekly in last couple months" (07/22/2018)   Hyperlipidemia    Hypertension  Inflammatory neuropathy (HCC)    Joint pain in fingers of right hand    Lichen sclerosus    Osteopenia    Pneumonia 2012? 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 07/2018 s/p SJM Assurity MRI model [id number] (Ser# [id number]).   Sleep apnea    "using nasal pilllows since ~ 12/2017" (07/22/2018)   TMJ (dislocation of temporomandibular joint)    Past Surgical History:  Procedure Laterality Date   APPENDECTOMY     BREAST CYST ASPIRATION Bilateral    BREAST CYST EXCISION Right 1978   benign    COLONOSCOPY     COLONOSCOPY WITH PROPOFOL N/A 07/10/2019   Procedure: COLONOSCOPY WITH PROPOFOL;  Surgeon: Lollie Sails, MD;  Location: [facility] ENDOSCOPY;  Service: Endoscopy;  Laterality: N/A;   ESOPHAGOGASTRODUODENOSCOPY (EGD) WITH PROPOFOL N/A 07/10/2019   Procedure: ESOPHAGOGASTRODUODENOSCOPY (EGD) WITH PROPOFOL;  Surgeon: Lollie Sails, MD;  Location: [facility] ENDOSCOPY;  Service: Endoscopy;  Laterality: N/A;   HYSTEROSCOPY WITH D & C N/A 11/05/2018   Procedure: DILATATION AND CURETTAGE /HYSTEROSCOPY;  Surgeon: Homero Fellers, MD;  Location: [facility] ORS;  Service: Gynecology;  Laterality: N/A;   INSERT / REPLACE / REMOVE PACEMAKER     PACEMAKER IMPLANT N/A 07/23/2018   SJM Assurity 2272 implanted by Dr Rayann Heman for mobitz II second degree AV block   UTERINE POLYPS REMOVAL     Family History  Problem Relation Age of Onset   Stroke Mother    Atrial fibrillation Mother    Breast cancer Mother    Stroke Father    Breast cancer Maternal Aunt    Breast cancer Paternal Aunt    Social History   Socioeconomic History   Marital status: Single    Spouse name: Not on file   Number of children: Not on file   Years of education: Not on file   Highest education level: Not on file  Occupational History   Not on file  Tobacco Use   Smoking status: Never   Smokeless tobacco: Never   Tobacco comments:    07/22/2018 "smoked some in college; 1960s;  nothing since"  Vaping Use   Vaping Use: Never used  Substance and Sexual Activity   Alcohol use: Yes    Comment: rarely   Drug use: Never   Sexual activity: Not Currently    Birth control/protection: Post-menopausal  Other Topics Concern   Not on file  Social History Narrative   Right Handed   Lives in an apartment. 5th floor but has elevator    Drinks Caffeine.    Social Determinants of Health   Financial Resource Strain: Low Risk  (11/13/2022)   Overall Financial Resource Strain (CARDIA)    Difficulty of Paying Living Expenses:  Not hard at all  Food Insecurity: No Food Insecurity (11/13/2022)   Hunger Vital Sign    Worried About Running Out of Food in the Last Year: Never true    Ran Out of Food in the Last Year: Never true  Transportation Needs: No Transportation Needs (11/13/2022)   PRAPARE - Hydrologist (Medical): No    Lack of Transportation (Non-Medical): No  Physical Activity: Insufficiently Active (11/13/2022)   Exercise Vital Sign  Days of Exercise per Week: 2 days    Minutes of Exercise per Session: 10 min  Stress: No Stress Concern Present (11/13/2022)   [place]    Feeling of Stress : Only a little  Social Connections: Unknown (11/13/2022)   Social Connection and Isolation Panel [NHANES]    Frequency of Communication with Friends and Family: More than three times a week    Frequency of Social Gatherings with Friends and Family: More than three times a week    Attends Religious Services: Not on Advertising copywriter or Organizations: Yes    Attends Archivist Meetings: More than 4 times per year    Marital Status: Never married    Tobacco Counseling Counseling given: Not Answered Tobacco comments: 07/22/2018 "smoked some in college; 1960s;  nothing since"   Clinical Intake:  Pre-visit preparation completed: Yes        Diabetes: No  How often do you need to have someone help you when you read instructions, pamphlets, or other written materials from your doctor or pharmacy?: 1 - Never    Interpreter Needed?: No      Activities of Daily Living    11/13/2022    9:07 AM 11/09/2022    9:23 AM  In your present state of health, do you have any difficulty performing the following activities:  Hearing? 1 1  Comment Hearing aids   Vision? 0 0  Difficulty concentrating or making decisions? 0 0  Walking or climbing stairs? 1 1  Comment Cane and walker in use. Cortisone  injection last rec'd 05/2022.   Dressing or bathing? 0 0  Doing errands, shopping? 0 0  Preparing Food and eating ? N N  Comment Staff assist with meal prep. Self feeds.   Using the Toilet? N N  In the past six months, have you accidently leaked urine? N N  Do you have problems with loss of bowel control? N N  Managing your Medications? N N  Managing your Finances? N N  Housekeeping or managing your Housekeeping? Aggie Moats  Comment Maid assist once weekly     Patient Care Team: Leone Haven, MD as PCP - General (Family Medicine) Vickie Epley, MD as PCP - Electrophysiology (Cardiology) Alda Berthold, DO as Consulting Physician (Neurology) Minna Merritts, MD as Consulting Physician (Cardiology) Minna Merritts, MD as Consulting Physician (Cardiology)  Indicate any recent Medical Services you may have received from other than [facility] providers in the past year (date may be approximate).     Assessment:   This is a routine wellness examination for Alison Huffman.  I connected with  Alison Huffman on 11/13/22 by a audio enabled telemedicine application and verified that I am speaking with the correct person using two identifiers.  Patient Location: Home  Provider Location: Office/Clinic  I discussed the limitations of evaluation and management by telemedicine. The patient expressed understanding and agreed to proceed.   Hearing/Vision screen Hearing Screening - Comments:: Followed by [facility]  Hearing aid, bilateral Vision Screening - Comments:: Followed by [facility] Ophthalmologist Wears glasses They have regular follow up with the ophthalmologist every 6 months.    Dietary issues and exercise activities discussed: Current Exercise Habits: Home exercise routine, Type of exercise: stretching (post therapy exercises), Time (Minutes): 10, Frequency (Times/Week): 2, Weekly Exercise (Minutes/Week): 20, Intensity: Mild   Goals Addressed  This  Visit's Progress     Patient Stated     I would like to maintain my physical activity (pt-stated)        Stay active with exercises Weight goal 170lb       Depression Screen    07/17/2022    8:29 AM 01/13/2022    8:58 AM 11/28/2021    1:00 PM 10/19/2021   10:59 AM 09/01/2021    7:57 AM 08/02/2021    9:26 AM 07/06/2021   10:33 AM  PHQ 2/9 Scores  PHQ - 2 Score 0 0 0 0 0 0 1  PHQ- 9 Score    3  3 8    $ Fall Risk    11/13/2022    9:06 AM 11/09/2022    9:23 AM 07/17/2022    8:29 AM 05/19/2022   10:52 AM 01/13/2022    8:58 AM  Fall Risk   Falls in the past year? 0 0 0 1 0  Number falls in past yr: 0 0 0 0 0  Injury with Fall? 0 0 0 1 0  Risk for fall due to :   No Fall Risks  No Fall Risks  Risk for fall due to: Comment Cane or rollator walker in use      Follow up Falls evaluation completed;Falls prevention discussed  Falls evaluation completed  Falls evaluation completed    FALL RISK PREVENTION PERTAINING TO THE HOME: Home free of loose throw rugs in walkways, pet beds, electrical cords, etc? Yes  Adequate lighting in your home to reduce risk of falls? Yes   ASSISTIVE DEVICES UTILIZED TO PREVENT FALLS: Life alert? Yes  Use of a cane, walker or w/c? Yes  Grab bars in the bathroom? Yes  Shower chair or bench in shower? Yes  Elevated toilet seat or a handicapped toilet? Yes   TIMED UP AND GO: Was the test performed? No .   Cognitive Function:    05/03/2018    8:54 AM  MMSE - Mini Mental State Exam  Orientation to time 5  Orientation to Place 5  Registration 3  Attention/ Calculation 5  Recall 3  Language- name 2 objects 2  Language- repeat 1  Language- follow 3 step command 3  Language- read & follow direction 1  Write a sentence 1  Copy design 1  Total score 30        09/21/2020    9:18 AM 09/19/2019    9:28 AM  6CIT Screen  What Year? 0 points 0 points  What month? 0 points 0 points  What time? 0 points 0 points  Count back from 20 0 points 0 points   Months in reverse 0 points 0 points  Repeat phrase 0 points 0 points  Total Score 0 points 0 points    Immunizations Immunization History  Administered Date(s) Administered   Fluad Quad(high Dose 65+) 06/18/2019, 10/06/2020   Hepatitis B 01/01/1990, 01/31/1990, 07/03/1990   Influenza Whole 07/07/1999   Influenza, High Dose Seasonal PF 07/13/2021, 07/12/2022   Influenza-Unspecified 06/18/2017, 07/10/2018   PFIZER(Purple Top)SARS-COV-2 Vaccination 10/09/2019, 11/05/2019, 09/09/2020, 02/16/2021   Pneumococcal Conjugate-13 05/06/2014   Pneumococcal Polysaccharide-23 05/10/2005   Tdap 09/04/2017   Unspecified SARS-COV-2 Vaccination 10/09/2019, 11/05/2019   Zoster Recombinat (Shingrix) 08/26/2019, 12/15/2019   Zoster, Live 07/28/2008, 08/11/2008   Screening Tests Health Maintenance  Topic Date Due   COVID-19 Vaccine (7 - 2023-24 season) 11/29/2022 (Originally 06/02/2022)   Medicare Annual Wellness (AWV)  11/14/2023   DTaP/Tdap/Td (2 -  Td or Tdap) 09/05/2027   Pneumonia Vaccine 59+ Years old  Completed   INFLUENZA VACCINE  Completed   DEXA SCAN  Completed   Zoster Vaccines- Shingrix  Completed   HPV VACCINES  Aged Out   Health Maintenance There are no preventive care reminders to display for this patient.  Lung Cancer Screening: (Low Dose CT Chest recommended if Age 83-80 years, 30 pack-year currently smoking OR have quit w/in 15years.) does not qualify.   Hepatitis C Screening: does not qualify.  Vision Screening: Recommended annual ophthalmology exams for early detection of glaucoma and other disorders of the eye.  Dental Screening: Recommended annual dental exams for proper oral hygiene  Community Resource Referral / Chronic Care Management: CRR required this visit?  No   CCM required this visit?  No      Plan:     I have personally reviewed and noted the following in the patient's chart:   Medical and social history Use of alcohol, tobacco or illicit drugs   Current medications and supplements including opioid prescriptions. Patient is not currently taking opioid prescriptions. Functional ability and status Nutritional status Physical activity Advanced directives List of other physicians Hospitalizations, surgeries, and ER visits in previous 12 months Vitals Screenings to include cognitive, depression, and falls Referrals and appointments  In addition, I have reviewed and discussed with patient certain preventive protocols, quality metrics, and best practice recommendations. A written personalized care plan for preventive services as well as general preventive health recommendations were provided to patient.     Leta Jungling, LPN   [id number]

## 2022-11-13 NOTE — Progress Notes (Signed)
Tommi Rumps, MD Phone: [phone]  Alison Huffman is a 82 y.o. female who presents today for f/u.  HYPERTENSION Disease Monitoring Home BP Monitoring 110s-130s/40s-60s Chest pain- no    Dyspnea- had some last week, see below Medications Compliance-  taking losartan.  Edema- see below BMET    Component Value Date/Time   NA 140 07/17/2022 0858   NA 142 05/23/2021 1210   K 4.2 07/17/2022 0858   CL 105 07/17/2022 0858   CO2 26 07/17/2022 0858   GLUCOSE 82 07/17/2022 0858   BUN 12 07/17/2022 0858   BUN 13 05/23/2021 1210   CREATININE 0.53 07/17/2022 0858   CALCIUM 9.4 07/17/2022 0858   GFRNONAA >60 12/22/2021 1256   GFRAA >60 [id number]   Diastolic CHF: Notes no orthopnea or PND.  Last week she had a little bit of dyspnea on exertion this more than usual.  Her heart rate was up to 100 with activity.  She notes she took Lasix 3 days ago and over the last couple of days this breathing issue and any swelling she had returned back to her baseline.  Lower extremity edema: Patient notes she went to the [place] to establish with a PCP there to try to get hearing aids.  She notes the [place] PCP advised her she had lymphedema and sent her to the lymphedema clinic.  The OT at the lymphedema clinic advised that she needed compression pumps.  Her PCP they are ordered compression stockings.  She had noticed some tingling in her feet and lower legs after using the compression stockings.  She notes when she gets up in the morning her legs are not swollen at all.  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  She is unsure if the compression stockings helped at all.  She notes no pain in her legs though she does note her legs feel tired.  Social History   Tobacco Use  Smoking Status Never  Smokeless Tobacco Never  Tobacco Comments   07/22/2018 "smoked some in college; 1960s;  nothing since"    Current Outpatient Medications on File Prior to Visit  Medication Sig Dispense Refill   acetaminophen (TYLENOL)  325 MG tablet Take 325-650 mg by mouth every 6 (six) hours as needed for mild pain or headache.      Cholecalciferol 50 MCG (2000 UT) TABS Take 1 tablet by mouth daily.     clobetasol ointment (TEMOVATE) 0.05 %      furosemide (LASIX) 20 MG tablet TAKE ONE TABLET BY MOUTH EVERY DAY AS NEEDED FOR EDEMA 30 tablet 3   hydrocerin (EUCERIN) CREA Apply 1 application topically 2 (two) times daily. To bilateral feet.  0   ketoconazole (NIZORAL) 2 % cream Apply 1 application topically daily. 15 g 0   losartan (COZAAR) 25 MG tablet TAKE 1 TABLET BY MOUTH ONCE DAILY 90 tablet 3   methylcellulose (CITRUCEL) oral powder      Naproxen Sodium (ALEVE PO) Take by mouth as needed.     polyethylene glycol (MIRALAX / GLYCOLAX) 17 g packet Take 17 g by mouth daily as needed for severe constipation. 14 each 0   rosuvastatin (CRESTOR) 20 MG tablet Take 1 tablet (20 mg total) by mouth daily. 90 tablet 3   tacrolimus (PROTOPIC) 0.1 % ointment Apply topically 2 (two) times daily as needed (for psoriasis).      Vitamin D, Ergocalciferol, 50 MCG (2000 UT) CAPS Take by mouth.     No current facility-administered medications on file prior to  visit.     ROS see history of present illness  Objective  Physical Exam Vitals:   11/13/22 1049  BP: 120/72  Pulse: 70  Temp: 98 F (36.7 C)  SpO2: 96%    BP Readings from Last 3 Encounters:  11/13/22 120/72  10/11/22 138/72  08/12/22 (!) 153/77   Wt Readings from Last 3 Encounters:  11/13/22 182 lb 9.6 oz (82.8 kg)  11/13/22 183 lb (83 kg)  10/11/22 183 lb (83 kg)    Physical Exam Constitutional:      General: She is not in acute distress.    Appearance: She is not diaphoretic.  Cardiovascular:     Rate and Rhythm: Normal rate and regular rhythm.     Heart sounds: Normal heart sounds.  Pulmonary:     Effort: Pulmonary effort is normal.     Breath sounds: Normal breath sounds.  Musculoskeletal:     Comments: Trace-1+ pitting edema bilateral lower  extremities up to the midshin, varicose veins noted bilaterally in her lower legs, there is some discomfort on palpation of her lower legs along the tibial area on the left  Skin:    General: Skin is warm and dry.  Neurological:     Mental Status: She is alert.      Assessment/Plan: Please see individual problem list.  Primary hypertension Assessment & Plan: Chronic issue.  Generally well-controlled.  She will continue losartan 25 mg daily.   Chronic diastolic (congestive) heart failure (HCC) Assessment & Plan: Chronic issue.  I suspect the patient had some fluid accumulation last week that was contributing to her shortness of breath.  This seems to have returned back to its baseline after using a dose of Lasix.  She can continue Lasix 20 mg daily as needed for swelling or shortness of breath.   Bilateral leg edema Assessment & Plan: Chronic issue. I suspect the patient's symptoms are related to venous insufficiency or her CHF.  Discussed continuing compression stockings.  Discussed using Lasix daily as needed for swelling.  Lymphedema would seem to be less likely and I am not sure she would benefit from compression pumps or OT for lymphedema at this time.  Discussed elevating her legs is much as possible and continuing compression stockings.     Return in about 3 months (around 02/11/2023) for Leg swelling follow-up.   Tommi Rumps, MD [place]

## 2022-11-13 NOTE — Patient Instructions (Addendum)
Alison Huffman , Thank you for taking time to come for your Medicare Wellness Visit. I appreciate your ongoing commitment to your health goals. Please review the following plan we discussed and let me know if I can assist you in the future.   These are the goals we discussed:  Goals       Patient Stated     I would like to maintain my physical activity (pt-stated)      Stay active with exercises Weight goal 170lb        This is a list of the screening recommended for you and due dates:  Health Maintenance  Topic Date Due   COVID-19 Vaccine (7 - 2023-24 season) 11/29/2022*   Medicare Annual Wellness Visit  11/14/2023   DTaP/Tdap/Td vaccine (2 - Td or Tdap) 09/05/2027   Pneumonia Vaccine  Completed   Flu Shot  Completed   DEXA scan (bone density measurement)  Completed   Zoster (Shingles) Vaccine  Completed   HPV Vaccine  Aged Out  *Topic was postponed. The date shown is not the original due date.    Advanced directives: on file.  Conditions/risks identified: none new.  Next appointment: Follow up in one year for your annual wellness visit    Preventive Care 65 Years and Older, Female Preventive care refers to lifestyle choices and visits with your health care provider that can promote health and wellness. What does preventive care include? A yearly physical exam. This is also called an annual well check. Dental exams once or twice a year. Routine eye exams. Ask your health care provider how often you should have your eyes checked. Personal lifestyle choices, including: Daily care of your teeth and gums. Regular physical activity. Eating a healthy diet. Avoiding tobacco and drug use. Limiting alcohol use. Practicing safe sex. Taking low-dose aspirin every day. Taking vitamin and mineral supplements as recommended by your health care provider. What happens during an annual well check? The services and screenings done by your health care provider during your annual well  check will depend on your age, overall health, lifestyle risk factors, and family history of disease. Counseling  Your health care provider may ask you questions about your: Alcohol use. Tobacco use. Drug use. Emotional well-being. Home and relationship well-being. Sexual activity. Eating habits. History of falls. Memory and ability to understand (cognition). Work and work Statistician. Reproductive health. Screening  You may have the following tests or measurements: Height, weight, and BMI. Blood pressure. Lipid and cholesterol levels. These may be checked every 5 years, or more frequently if you are over 64 years old. Skin check. Lung cancer screening. You may have this screening every year starting at age 63 if you have a 30-pack-year history of smoking and currently smoke or have quit within the past 15 years. Fecal occult blood test (FOBT) of the stool. You may have this test every year starting at age 53. Flexible sigmoidoscopy or colonoscopy. You may have a sigmoidoscopy every 5 years or a colonoscopy every 10 years starting at age 45. Hepatitis C blood test. Hepatitis B blood test. Sexually transmitted disease (STD) testing. Diabetes screening. This is done by checking your blood sugar (glucose) after you have not eaten for a while (fasting). You may have this done every 1-3 years. Bone density scan. This is done to screen for osteoporosis. You may have this done starting at age 27. Mammogram. This may be done every 1-2 years. Talk to your health care provider about how often you  should have regular mammograms. Talk with your health care provider about your test results, treatment options, and if necessary, the need for more tests. Vaccines  Your health care provider may recommend certain vaccines, such as: Influenza vaccine. This is recommended every year. Tetanus, diphtheria, and acellular pertussis (Tdap, Td) vaccine. You may need a Td booster every 10 years. Zoster  vaccine. You may need this after age 69. Pneumococcal 13-valent conjugate (PCV13) vaccine. One dose is recommended after age 57. Pneumococcal polysaccharide (PPSV23) vaccine. One dose is recommended after age 70. Talk to your health care provider about which screenings and vaccines you need and how often you need them. This information is not intended to replace advice given to you by your health care provider. Make sure you discuss any questions you have with your health care provider. Document Released: 10/15/2015 Document Revised: 06/07/2016 Document Reviewed: 07/20/2015 Elsevier Interactive Patient Education  2017 [place] Prevention in the Home Falls can cause injuries. They can happen to people of all ages. There are many things you can do to make your home safe and to help prevent falls. What can I do on the outside of my home? Regularly fix the edges of walkways and driveways and fix any cracks. Remove anything that might make you trip as you walk through a door, such as a raised step or threshold. Trim any bushes or trees on the path to your home. Use bright outdoor lighting. Clear any walking paths of anything that might make someone trip, such as rocks or tools. Regularly check to see if handrails are loose or broken. Make sure that both sides of any steps have handrails. Any raised decks and porches should have guardrails on the edges. Have any leaves, snow, or ice cleared regularly. Use sand or salt on walking paths during winter. Clean up any spills in your garage right away. This includes oil or grease spills. What can I do in the bathroom? Use night lights. Install grab bars by the toilet and in the tub and shower. Do not use towel bars as grab bars. Use non-skid mats or decals in the tub or shower. If you need to sit down in the shower, use a plastic, non-slip stool. Keep the floor dry. Clean up any water that spills on the floor as soon as it happens. Remove  soap buildup in the tub or shower regularly. Attach bath mats securely with double-sided non-slip rug tape. Do not have throw rugs and other things on the floor that can make you trip. What can I do in the bedroom? Use night lights. Make sure that you have a light by your bed that is easy to reach. Do not use any sheets or blankets that are too big for your bed. They should not hang down onto the floor. Have a firm chair that has side arms. You can use this for support while you get dressed. Do not have throw rugs and other things on the floor that can make you trip. What can I do in the kitchen? Clean up any spills right away. Avoid walking on wet floors. Keep items that you use a lot in easy-to-reach places. If you need to reach something above you, use a strong step stool that has a grab bar. Keep electrical cords out of the way. Do not use floor polish or wax that makes floors slippery. If you must use wax, use non-skid floor wax. Do not have throw rugs and other things on the  floor that can make you trip. What can I do with my stairs? Do not leave any items on the stairs. Make sure that there are handrails on both sides of the stairs and use them. Fix handrails that are broken or loose. Make sure that handrails are as long as the stairways. Check any carpeting to make sure that it is firmly attached to the stairs. Fix any carpet that is loose or worn. Avoid having throw rugs at the top or bottom of the stairs. If you do have throw rugs, attach them to the floor with carpet tape. Make sure that you have a light switch at the top of the stairs and the bottom of the stairs. If you do not have them, ask someone to add them for you. What else can I do to help prevent falls? Wear shoes that: Do not have high heels. Have rubber bottoms. Are comfortable and fit you well. Are closed at the toe. Do not wear sandals. If you use a stepladder: Make sure that it is fully opened. Do not climb a  closed stepladder. Make sure that both sides of the stepladder are locked into place. Ask someone to hold it for you, if possible. Clearly mark and make sure that you can see: Any grab bars or handrails. First and last steps. Where the edge of each step is. Use tools that help you move around (mobility aids) if they are needed. These include: Canes. Walkers. Scooters. Crutches. Turn on the lights when you go into a dark area. Replace any light bulbs as soon as they burn out. Set up your furniture so you have a clear path. Avoid moving your furniture around. If any of your floors are uneven, fix them. If there are any pets around you, be aware of where they are. Review your medicines with your doctor. Some medicines can make you feel dizzy. This can increase your chance of falling. Ask your doctor what other things that you can do to help prevent falls. This information is not intended to replace advice given to you by your health care provider. Make sure you discuss any questions you have with your health care provider. Document Released: 07/15/2009 Document Revised: 02/24/2016 Document Reviewed: 10/23/2014 Elsevier Interactive Patient Education  2017 [employer].

## 2022-11-13 NOTE — Patient Instructions (Signed)
Nice to see you. I think for now we can continue with compression stockings and leg elevation to help with your leg swelling.  You can also continue your Lasix daily as needed for swelling.

## 2022-11-14 DIAGNOSIS — R490 Dysphonia: Secondary | ICD-10-CM | POA: Diagnosis not present

## 2022-11-14 DIAGNOSIS — G610 Guillain-Barre syndrome: Secondary | ICD-10-CM | POA: Diagnosis not present

## 2022-11-14 DIAGNOSIS — K2100 Gastro-esophageal reflux disease with esophagitis, without bleeding: Secondary | ICD-10-CM | POA: Diagnosis not present

## 2022-11-15 DIAGNOSIS — M722 Plantar fascial fibromatosis: Secondary | ICD-10-CM | POA: Diagnosis not present

## 2022-11-15 DIAGNOSIS — B351 Tinea unguium: Secondary | ICD-10-CM | POA: Diagnosis not present

## 2022-11-15 DIAGNOSIS — I7091 Generalized atherosclerosis: Secondary | ICD-10-CM | POA: Diagnosis not present

## 2022-11-16 DIAGNOSIS — R490 Dysphonia: Secondary | ICD-10-CM | POA: Diagnosis not present

## 2022-11-16 DIAGNOSIS — G610 Guillain-Barre syndrome: Secondary | ICD-10-CM | POA: Diagnosis not present

## 2022-11-16 DIAGNOSIS — K2100 Gastro-esophageal reflux disease with esophagitis, without bleeding: Secondary | ICD-10-CM | POA: Diagnosis not present

## 2022-11-16 NOTE — Progress Notes (Addendum)
   Subjective:   Alison Huffman is a 82 y.o. female who presents for Medicare Annual (Subsequent) preventive examination.   Second note.           Assessment:   This is a routine wellness examination for Alison Huffman.  Cognitive Function: Patient is alert and oriented x3.  Correctly answered the current year, month, time, months of the year in reverse, counting backward from 20 to 1 and 5 components to remember.   Advanced directive: on file.    Office Visit from 11/13/2022 in [place] at [facility]    11/13/2022   1215  PHQ 2 & 9 Depression Scale- Over the past 2 weeks, how often have you been bothered by any of the following problems?   Little interest or pleasure in doing things Not at all  Feeling down, depressed, or hopeless (PHQ Adolescent also includes...irritable) Not at all  PHQ-2 Total Score 0  Trouble falling or staying asleep, or sleeping too much Not at all  Feeling tired or having little energy Several days  Poor appetite or overeating (PHQ Adolescent also includes...weight loss) Not at all  Feeling bad about yourself - or that you are a failure or have let yourself or your family down Not at all  Trouble concentrating on things, such as reading the newspaper or watching television ([place] Adolescent also includes...like school work) Not at all  Moving or speaking so slowly that other people could have noticed. Or the opposite - being so fidgety or restless that you have been moving around a lot more than usual Several days  Thoughts that you would be better off dead, or of hurting yourself in some way Not at all  PHQ-9 Total Score 2  If you checked off any problems, how difficult have these problems made it for you to do your work, take care of things at home, or get along with other people?   Depression screening:  PHQ-2=0 PHQ-9=2      [name], LPN

## 2022-11-23 DIAGNOSIS — R490 Dysphonia: Secondary | ICD-10-CM | POA: Diagnosis not present

## 2022-11-23 DIAGNOSIS — K2100 Gastro-esophageal reflux disease with esophagitis, without bleeding: Secondary | ICD-10-CM | POA: Diagnosis not present

## 2022-11-23 DIAGNOSIS — G610 Guillain-Barre syndrome: Secondary | ICD-10-CM | POA: Diagnosis not present

## 2022-11-27 DIAGNOSIS — R490 Dysphonia: Secondary | ICD-10-CM | POA: Diagnosis not present

## 2022-11-27 DIAGNOSIS — K2100 Gastro-esophageal reflux disease with esophagitis, without bleeding: Secondary | ICD-10-CM | POA: Diagnosis not present

## 2022-11-27 DIAGNOSIS — G610 Guillain-Barre syndrome: Secondary | ICD-10-CM | POA: Diagnosis not present

## 2022-11-30 DIAGNOSIS — G610 Guillain-Barre syndrome: Secondary | ICD-10-CM | POA: Diagnosis not present

## 2022-11-30 DIAGNOSIS — R490 Dysphonia: Secondary | ICD-10-CM | POA: Diagnosis not present

## 2022-11-30 DIAGNOSIS — K2100 Gastro-esophageal reflux disease with esophagitis, without bleeding: Secondary | ICD-10-CM | POA: Diagnosis not present

## 2022-12-04 DIAGNOSIS — G610 Guillain-Barre syndrome: Secondary | ICD-10-CM | POA: Diagnosis not present

## 2022-12-04 DIAGNOSIS — K2100 Gastro-esophageal reflux disease with esophagitis, without bleeding: Secondary | ICD-10-CM | POA: Diagnosis not present

## 2022-12-04 DIAGNOSIS — R490 Dysphonia: Secondary | ICD-10-CM | POA: Diagnosis not present

## 2022-12-07 DIAGNOSIS — K2100 Gastro-esophageal reflux disease with esophagitis, without bleeding: Secondary | ICD-10-CM | POA: Diagnosis not present

## 2022-12-07 DIAGNOSIS — G610 Guillain-Barre syndrome: Secondary | ICD-10-CM | POA: Diagnosis not present

## 2022-12-07 DIAGNOSIS — R490 Dysphonia: Secondary | ICD-10-CM | POA: Diagnosis not present

## 2022-12-13 DIAGNOSIS — R490 Dysphonia: Secondary | ICD-10-CM | POA: Diagnosis not present

## 2022-12-13 DIAGNOSIS — G610 Guillain-Barre syndrome: Secondary | ICD-10-CM | POA: Diagnosis not present

## 2022-12-13 DIAGNOSIS — K2100 Gastro-esophageal reflux disease with esophagitis, without bleeding: Secondary | ICD-10-CM | POA: Diagnosis not present

## 2022-12-14 DIAGNOSIS — K2100 Gastro-esophageal reflux disease with esophagitis, without bleeding: Secondary | ICD-10-CM | POA: Diagnosis not present

## 2022-12-14 DIAGNOSIS — R490 Dysphonia: Secondary | ICD-10-CM | POA: Diagnosis not present

## 2022-12-14 DIAGNOSIS — G610 Guillain-Barre syndrome: Secondary | ICD-10-CM | POA: Diagnosis not present

## 2022-12-18 ENCOUNTER — Encounter: Payer: Self-pay | Admitting: Cardiology

## 2022-12-19 ENCOUNTER — Ambulatory Visit (INDEPENDENT_AMBULATORY_CARE_PROVIDER_SITE_OTHER): Payer: Medicare Other | Admitting: Nurse Practitioner

## 2022-12-19 ENCOUNTER — Encounter: Payer: Self-pay | Admitting: Nurse Practitioner

## 2022-12-19 VITALS — BP 118/62 | HR 70 | Temp 98.2°F | Ht 66.0 in | Wt 178.2 lb

## 2022-12-19 DIAGNOSIS — R195 Other fecal abnormalities: Secondary | ICD-10-CM | POA: Diagnosis not present

## 2022-12-19 DIAGNOSIS — Z20822 Contact with and (suspected) exposure to covid-19: Secondary | ICD-10-CM | POA: Diagnosis not present

## 2022-12-19 LAB — POC COVID19 BINAXNOW: SARS Coronavirus 2 Ag: NEGATIVE

## 2022-12-19 NOTE — Progress Notes (Signed)
[name], NP-C Phone: [phone]  Alison Huffman is a 82 y.o. female who presents today for diarrhea.  Diarrhea: Patient complains of diarrhea. Onset of diarrhea was several days ago. Diarrhea is occurring approximately several times per day. Patient reports one episode daily x 3 days then increasing in episodes yesterday, with loose stools occurring approximately 6 times. Patient describes diarrhea as semisolid. Diarrhea has been associated with  none .  Patient denies blood in stool, fever, recent antibiotic use, recent camping, recent travel, significant abdominal pain.  Previous visits for diarrhea: none. Evaluation to date: none. Treatment to date: OTC anti-diarrheal.   She reports taking one tablet of OTC anti-diarrheal yesterday evening and has not had diarrhea since. She reports having a more formed bowel movement this morning. She has been eating a bland diet, toast, applesauce, rice and drinking fluids normally. She reports possibly being exposed to [place] as she lives in a [place] community and multiple people have recently tested positive. She is requesting to be tested. She denies any respiratory symptoms. Denies cough, congestion, fever, sore throat.    Social History   Tobacco Use  Smoking Status Never  Smokeless Tobacco Never  Tobacco Comments   07/22/2018 "smoked some in college; 1960s;  nothing since"    Current Outpatient Medications on File Prior to Visit  Medication Sig Dispense Refill   acetaminophen (TYLENOL) 325 MG tablet Take 325-650 mg by mouth every 6 (six) hours as needed for mild pain or headache.      Cholecalciferol 50 MCG (2000 UT) TABS Take 1 tablet by mouth daily.     clobetasol ointment (TEMOVATE) 0.05 %      furosemide (LASIX) 20 MG tablet TAKE ONE TABLET BY MOUTH EVERY DAY AS NEEDED FOR EDEMA 30 tablet 3   hydrocerin (EUCERIN) CREA Apply 1 application topically 2 (two) times daily. To bilateral feet.  0   ketoconazole (NIZORAL) 2 % cream Apply  1 application topically daily. 15 g 0   losartan (COZAAR) 25 MG tablet TAKE 1 TABLET BY MOUTH ONCE DAILY 90 tablet 3   methylcellulose (CITRUCEL) oral powder      Naproxen Sodium (ALEVE PO) Take by mouth as needed.     polyethylene glycol (MIRALAX / GLYCOLAX) 17 g packet Take 17 g by mouth daily as needed for severe constipation. 14 each 0   rosuvastatin (CRESTOR) 20 MG tablet Take 1 tablet (20 mg total) by mouth daily. 90 tablet 3   tacrolimus (PROTOPIC) 0.1 % ointment Apply topically 2 (two) times daily as needed (for psoriasis).      Vitamin D, Ergocalciferol, 50 MCG (2000 UT) CAPS Take by mouth.     No current facility-administered medications on file prior to visit.    ROS see history of present illness  Objective  Physical Exam Vitals:   12/19/22 0843  BP: 118/62  Pulse: 70  Temp: 98.2 F (36.8 C)  SpO2: 97%    BP Readings from Last 3 Encounters:  12/19/22 118/62  11/13/22 120/72  10/11/22 138/72   Wt Readings from Last 3 Encounters:  12/19/22 178 lb 3.2 oz (80.8 kg)  11/13/22 182 lb 9.6 oz (82.8 kg)  11/13/22 183 lb (83 kg)    Physical Exam Constitutional:      General: She is not in acute distress.    Appearance: Normal appearance.  HENT:     Head: Normocephalic.  Cardiovascular:     Rate and Rhythm: Normal rate and regular rhythm.     Heart  sounds: Normal heart sounds.  Pulmonary:     Effort: Pulmonary effort is normal.     Breath sounds: Normal breath sounds.  Abdominal:     General: Abdomen is flat. Bowel sounds are normal. There is no distension.     Palpations: Abdomen is soft. There is no mass.     Tenderness: There is no abdominal tenderness.  Skin:    General: Skin is warm and dry.  Neurological:     General: No focal deficit present.     Mental Status: She is alert.  Psychiatric:        Mood and Affect: Mood normal.        Behavior: Behavior normal.    Assessment/Plan: Please see individual problem list.  Loose stools Assessment &  Plan: Improved with one tablet OTC anti-diarrheal yesterday. Bowel movement more formed today. Encouraged adequate hydration and continue bland diet, she can increase her diet as tolerated. Advised patient to return if symptoms return or are worsening.    Contact with and (suspected) exposure to covid-19 Assessment & Plan: COVID test in office negative. Denies any respiratory symptoms.   Orders: -     POC COVID-19 BinaxNow   Return if symptoms worsen or fail to improve.   [name], NP-C [place]

## 2022-12-19 NOTE — Patient Instructions (Signed)
It was nice to meet you.   Please make sure you are drinking plenty of fluids.   Continue a bland diet and increase as tolerated.

## 2022-12-19 NOTE — Assessment & Plan Note (Addendum)
Improved with one tablet OTC anti-diarrheal yesterday. Bowel movement more formed today. Encouraged adequate hydration and continue bland diet, she can increase her diet as tolerated. Advised patient to return if symptoms return or are worsening.

## 2022-12-19 NOTE — Assessment & Plan Note (Signed)
COVID test in office negative. Denies any respiratory symptoms.

## 2022-12-26 ENCOUNTER — Ambulatory Visit (INDEPENDENT_AMBULATORY_CARE_PROVIDER_SITE_OTHER): Payer: Medicare Other | Admitting: Nurse Practitioner

## 2022-12-26 ENCOUNTER — Ambulatory Visit: Payer: Medicare Other | Attending: Nurse Practitioner

## 2022-12-26 ENCOUNTER — Encounter: Payer: Self-pay | Admitting: Nurse Practitioner

## 2022-12-26 VITALS — BP 126/60 | HR 68 | Temp 97.7°F | Ht 66.0 in | Wt 181.6 lb

## 2022-12-26 DIAGNOSIS — G4733 Obstructive sleep apnea (adult) (pediatric): Secondary | ICD-10-CM

## 2022-12-26 DIAGNOSIS — R0602 Shortness of breath: Secondary | ICD-10-CM | POA: Diagnosis not present

## 2022-12-26 DIAGNOSIS — U099 Post covid-19 condition, unspecified: Secondary | ICD-10-CM

## 2022-12-26 DIAGNOSIS — R0609 Other forms of dyspnea: Secondary | ICD-10-CM | POA: Diagnosis not present

## 2022-12-26 DIAGNOSIS — J479 Bronchiectasis, uncomplicated: Secondary | ICD-10-CM | POA: Diagnosis not present

## 2022-12-26 LAB — PULMONARY FUNCTION TEST ARMC ONLY
DL/VA % pred: 87 %
DL/VA: 3.51 ml/min/mmHg/L
DLCO unc % pred: 71 %
DLCO unc: 14.23 ml/min/mmHg
FEF 25-75 Post: 2.32 L/sec
FEF 25-75 Pre: 3.24 L/sec
FEF2575-%Change-Post: -28 %
FEF2575-%Pred-Post: 169 %
FEF2575-%Pred-Pre: 236 %
FEV1-%Change-Post: 1 %
FEV1-%Pred-Post: 94 %
FEV1-%Pred-Pre: 93 %
FEV1-Post: 1.92 L
FEV1-Pre: 1.9 L
FEV1FVC-%Change-Post: -10 %
FEV1FVC-%Pred-Pre: 130 %
FEV6-%Change-Post: 13 %
FEV6-%Pred-Post: 87 %
FEV6-%Pred-Pre: 77 %
FEV6-Post: 2.25 L
FEV6-Pre: 1.99 L
FEV6FVC-%Pred-Post: 105 %
FEV6FVC-%Pred-Pre: 105 %
FVC-%Change-Post: 13 %
FVC-%Pred-Post: 82 %
FVC-%Pred-Pre: 73 %
FVC-Post: 2.25 L
FVC-Pre: 1.99 L
Post FEV1/FVC ratio: 85 %
Post FEV6/FVC ratio: 100 %
Pre FEV1/FVC ratio: 95 %
Pre FEV6/FVC Ratio: 100 %
RV % pred: 81 %
RV: 2.09 L
TLC % pred: 85 %
TLC: 4.56 L

## 2022-12-26 MED ORDER — ALBUTEROL SULFATE (2.5 MG/3ML) 0.083% IN NEBU
2.5000 mg | INHALATION_SOLUTION | Freq: Once | RESPIRATORY_TRACT | Status: AC
  Administered 2022-12-26: 2.5 mg via RESPIRATORY_TRACT

## 2022-12-26 MED ORDER — ALBUTEROL SULFATE HFA 108 (90 BASE) MCG/ACT IN AERS: 2.0000 | INHALATION_SPRAY | Freq: Four times a day (QID) | RESPIRATORY_TRACT | 2 refills | Status: DC | PRN

## 2022-12-26 NOTE — Progress Notes (Signed)
Reviewed and agree with assessment/plan.   Chesley Mires, MD [facility] Pulmonary/Critical Care 12/26/2022, 7:16 PM Pager:  [phone]

## 2022-12-26 NOTE — Progress Notes (Signed)
@Patient  ID: Alison Huffman, female    DOB: 1940/12/20, 82 y.o.   MRN: [id number]  Chief Complaint  Patient presents with   Follow-up    Wearing cpap avg 8hr nightly-pressure and mask okay.  SOB with exertion.     Referring provider: Leone Haven, MD  HPI: 82 year old female, never smoker followed for OSA and bronchiectasis. She is a patient of Dr. Juanetta Gosling and last seen in office 06/28/2022. Past medical history significant for guillain barre, CAD, diastolic CHF, GERD, HLD, lichen sclerosus, psoriatic arthritis, second degree heart block s/p pacemaker, TMJ, rosacea, right vocal cord paralysis followed intubation.  TEST/EVENTS: 01/24/2018 PSG: AHI 24.1, SpO2 low 84% >> CPAP 8 cmH2O 06/05/2020 CT chest: mild cylindrical BTX RLL, mild GGO rt lung base  12/16/2020 CT chest: stable nodules, BTX 02/22/2022 PFT: FVC 87, FEV1 101, ratio 91, TLC 73, DLCOcor 74.  No BD 06/19/2022 HRCT chest: cylindrical BTX, calcified granulomas, scatted nodules up to 5 mm, GGO resolved, air trapping 12/26/2022 PFT: FVC 73, FEV1 93, ratio 85, TLC 85, DLCO 71.  13% change in FVC postbronchodilator  06/28/2022: Ov with Dr. Halford Chessman. CT chest looks better. Stable changes of regional btx and lung nodule. Not having much cough.  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  Doesn't feel limited by her breathing. Using CPAP at night. No issues with this.   12/26/2022: Today - follow up Patient presents today for follow-up.  Doing very well with her CPAP.  Sleeps well with this at night.  No excessive daytime fatigue, morning headaches or drowsy driving. She is having some more trouble with her breathing over the past couple of months.  She tells me that she had COVID in November, which was treated with Paxlovid.  She had a mild case and did not require any hospitalization.  Unfortunately since this time, she has felt like she has struggled with her activity tolerance.  She becomes short winded even with household chores or  walking at too fast of a pace.  She denies any cough, wheezing, increased leg swelling from baseline, orthopnea, palpitations, lightheadedness or dizziness.  She had pulmonary function testing today which showed a slight decline in her lung function when compared to a year ago.  Her diffusing capacity was stable and her total lung volume has actually improved.  She did have a 13% improvement in her FVC postbronchodilator.  She has never tried an inhaler before.    11/25/2022-12/24/2022: CPAP auto 5-15 cmH2O 30/30 days; 100% greater than 4 hours; av use 7 hours 58 minutes Pressure 95th 11.6 Leaks 95th 22.8 AHI 2.1  Allergies  Allergen Reactions   Etanercept     Guillain Baree   Nickel     Immunization History  Administered Date(s) Administered   Fluad Quad(high Dose 65+) 06/18/2019, 10/06/2020   Hepatitis B 01/01/1990, 01/31/1990, 07/03/1990   Influenza Whole 07/07/1999   Influenza, High Dose Seasonal PF 07/13/2021, 07/12/2022   Influenza-Unspecified 06/18/2017, 07/10/2018   PFIZER Comirnaty(Gray Top)Covid-19 Tri-Sucrose Vaccine 02/16/2021   PFIZER(Purple Top)SARS-COV-2 Vaccination 10/09/2019, 11/05/2019, 09/09/2020, 02/16/2021   Pneumococcal Conjugate-13 05/06/2014   Pneumococcal Polysaccharide-23 05/10/2005   Tdap 09/04/2017   Unspecified SARS-COV-2 Vaccination 10/09/2019, 11/05/2019   Zoster Recombinat (Shingrix) 08/26/2019, 12/15/2019   Zoster, Live 07/28/2008, 08/11/2008    Past Medical History:  Diagnosis Date   Ankle pain, right    Atypical chest pain    a. 10/2018 MV: EF 57%. No ischemia/infarct.   Coronary artery disease    Diastolic dysfunction  a. 04/2018 Echo: EF 55-60%, no rwma, Gr1 DD. Triv AI, mild MR. Mod dil LA. Nl RV fxn. PASP nl.   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 X 1   Presence of permanent cardiac pacemaker 07/23/2018   Psoriasis    Psoriatic arthritis ([place])    " dx'd the 1st of this year, 2019" (07/22/2018)   Psoriatic arthritis ([place])    Rosacea    Seborrheic keratosis    Second degree heart block    a. 07/2018 s/p SJM Assurity MRI model PM2271 (Ser# [id number]).   Sleep apnea    "using nasal pilllows since ~ 12/2017" (07/22/2018)   TMJ (dislocation of temporomandibular joint)     Tobacco History: Social History   Tobacco Use  Smoking Status Never  Smokeless Tobacco Never  Tobacco Comments   07/22/2018 "smoked some in college; 1960s;  nothing since"   Counseling given: Not Answered Tobacco comments: 07/22/2018 "smoked some in college; 1960s;  nothing since"   Outpatient Medications Prior to Visit  Medication Sig Dispense Refill   acetaminophen (TYLENOL) 325 MG tablet Take 325-650 mg by mouth every 6 (six) hours as needed for mild pain or headache.      Cholecalciferol 50 MCG (2000 UT) TABS Take 1 tablet by mouth daily.     clobetasol ointment (TEMOVATE) 0.05 %      furosemide (LASIX) 20 MG tablet TAKE ONE TABLET BY MOUTH EVERY DAY AS NEEDED FOR EDEMA 30 tablet 3   hydrocerin (EUCERIN) CREA Apply 1 application topically 2 (two) times daily. To bilateral feet.  0   ketoconazole (NIZORAL) 2 % cream Apply 1 application topically daily. 15 g 0   losartan (COZAAR) 25 MG tablet TAKE 1 TABLET BY MOUTH ONCE DAILY 90 tablet 3   methylcellulose (CITRUCEL) oral powder      Naproxen Sodium (ALEVE PO) Take by mouth as needed.     polyethylene glycol (MIRALAX / GLYCOLAX) 17 g packet Take 17 g by mouth daily as needed for severe constipation. 14 each 0   rosuvastatin (CRESTOR) 20 MG tablet Take 1 tablet (20 mg total) by mouth daily. 90 tablet 3   tacrolimus (PROTOPIC) 0.1 % ointment Apply topically 2 (two) times daily as needed (for psoriasis).      Vitamin D,  Ergocalciferol, 50 MCG (2000 UT) CAPS Take by mouth.     No facility-administered medications prior to visit.     Review of Systems:   Constitutional: No weight loss or gain, night sweats, fevers, chills, fatigue, or lassitude. HEENT: No headaches, difficulty swallowing, tooth/dental problems, or sore throat. No sneezing, itching, ear ache, nasal congestion, or post nasal drip CV: + swelling in lower extremities (baseline). No chest pain, orthopnea, PND, anasarca, dizziness, palpitations, syncope Resp: +shortness of breath with exertion. No excess mucus or change in color of mucus. No productive or non-productive. No hemoptysis. No wheezing.  No chest wall deformity GI:  No heartburn, indigestion, abdominal pain, nausea, vomiting, diarrhea, change in bowel habits, loss of appetite, bloody stools.  GU: No dysuria, change in color of urine, urgency or frequency.  No flank pain, no hematuria  Skin: No rash, lesions, ulcerations  MSK:  No joint pain or swelling.  No decreased range of motion.  No back pain. Neuro: No dizziness or lightheadedness.  Psych: No depression or anxiety. Mood stable.     Physical Exam:  BP 126/60 (BP Location: Left Arm, Cuff Size: Normal)   Pulse 68   Temp 97.7 F (36.5 C) (Temporal)   Ht 5\' 6"  (1.676 m)   Wt 181 lb 9.6 oz (82.4 kg)   SpO2 96%   BMI 29.31 kg/m   GEN: Pleasant, interactive, well-appearing; in no acute distress HEENT:  Normocephalic and atraumatic. PERRLA. Sclera white. Nasal turbinates pink, moist and patent bilaterally. No rhinorrhea present. Oropharynx pink and moist, without exudate or edema. No lesions, ulcerations, or postnasal drip.  NECK:  Supple w/ fair ROM. No JVD present. Normal carotid impulses w/o bruits. Thyroid symmetrical with no goiter or nodules palpated. No lymphadenopathy.   CV: RRR, no m/r/g, no peripheral edema. Pulses intact, +2 bilaterally. No cyanosis, pallor or clubbing. PULMONARY:  Unlabored, regular breathing. Clear  bilaterally A&P w/o wheezes/rales/rhonchi. No accessory muscle use.  GI: BS present and normoactive. Soft, non-tender to palpation. No organomegaly or masses detected.  MSK: No erythema, warmth or tenderness. Cap refil <2 sec all extrem. No deformities or joint swelling noted.  Neuro: A/Ox3. No focal deficits noted.   Skin: Warm, no lesions or rashe Psych: Normal affect and behavior. Judgement and thought content appropriate.     Lab Results:  CBC    Component Value Date/Time   WBC 5.4 09/12/2022 0000   WBC 5.4 12/22/2021 1256   RBC 4.07 09/12/2022 0000   HGB 12.6 09/12/2022 0000   HCT 37 09/12/2022 0000   PLT 227 09/12/2022 0000   MCV 94.5 12/22/2021 1256   MCH 30.4 12/22/2021 1256   MCHC 32.1 12/22/2021 1256   RDW 12.7 12/22/2021 1256   LYMPHSABS 1.7 12/22/2021 1256   MONOABS 0.4 12/22/2021 1256   EOSABS 0.1 12/22/2021 1256   BASOSABS 0.0 12/22/2021 1256    BMET    Component Value Date/Time   NA 142 09/12/2022 0000   K 4.5 09/12/2022 0000   CL 111 (A) 09/12/2022 0000   CO2 29 (A) 09/12/2022 0000   GLUCOSE 82 07/17/2022 0858   BUN 12 07/17/2022 0858   BUN 13 05/23/2021 1210   CREATININE 0.6 09/12/2022 0000   CREATININE 0.53 07/17/2022 0858   CALCIUM 9.0 09/12/2022 0000   GFRNONAA >60 12/22/2021 1256   GFRAA >60 05/03/2020 0513    BNP No results found for: "BNP"   Imaging:  No results found.  albuterol (PROVENTIL) (2.5 MG/3ML) 0.083% nebulizer solution 2.5 mg     Date Action Dose Route User   12/26/2022 [phone] Given 2.5 mg Nebulization Mabe, Karlton Lemon, RRT          Latest Ref Rng & Units 12/26/2022    9:41 AM 02/22/2022    2:36 PM  PFT Results  FVC-Pre L 1.99  P 2.42   FVC-Predicted Pre % 73  P 87   FVC-Post L 2.25  P 2.36   FVC-Predicted Post % 82  P 85   Pre FEV1/FVC % % 95  P 87   Post FEV1/FCV % % 85  P 91   FEV1-Pre L 1.90  P 2.10   FEV1-Predicted Pre % 93  P 101   FEV1-Post L 1.92  P 2.14   DLCO uncorrected ml/min/mmHg 14.23  P 13.36    DLCO UNC% % 71  P 66   DLVA  Predicted % 87  P 89   TLC L 4.56  P 3.93   TLC % Predicted % 85  P 73   RV % Predicted % 81  P 60     P Preliminary result    No results found for: "NITRICOXIDE"      Assessment & Plan:   Dyspnea on exertion Worsening DOE since COVID infection in November 2023.  Labs from December 2023 without any evidence of anemia or thyroid disease.  Pulmonary function testing today with decline in lung function when compared to 1 year ago by about 10%.  She now has some reversibility with positive postbronchodilator change in FVC.  DLCO remains stable at 71%.  Question if she has developed some reactive airway component following viral respiratory illness versus progression of bronchiectasis versus post-COVID fibrosis/ILD.  Will obtain HRCT for further evaluation.  We will trial her on bronchodilator regimen with as needed albuterol.  If she receives benefit from use, will consider maintenance therapy for her.  Patient Instructions  Trial albuterol inhaler 2 puffs every 6 hours as needed for shortness of breath or wheezing  Attend high resolution CT scan - someone will contact you to schedule this  Continue to use CPAP every night, minimum of 4-6 hours a night.  Change equipment every 30 days or as directed by DME. Wash your tubing with warm soap and water daily, hang to dry. Wash humidifier portion weekly.  Be aware of reduced alertness and do not drive or operate heavy machinery if experiencing this or drowsiness.   Follow up in 4-6 weeks with Dr. Halford Chessman or NP to go over CT scan results and see how inhaler is working. If symptoms do not improve or worsen, please contact office for sooner follow up or seek emergency care.    Bronchiectasis without complication ([place]) Does not appear to be in acute exacerbation.  No significant cough or chest congestion.  See above plan regarding progressive DOE.  OSA (obstructive sleep apnea) Excellent compliance and control of  events on current CPAP settings.  She continues to receive good benefit from use.  Cautioned on safe driving practices.   I spent 35 minutes of dedicated to the care of this patient on the date of this encounter to include pre-visit review of records, face-to-face time with the patient discussing conditions above, post visit ordering of testing, clinical documentation with the electronic health record, making appropriate referrals as documented, and communicating necessary findings to members of the patients care team.  Clayton Bibles, NP 12/26/2022  Pt aware and understands NP's role.

## 2022-12-26 NOTE — Patient Instructions (Addendum)
Trial albuterol inhaler 2 puffs every 6 hours as needed for shortness of breath or wheezing  Attend high resolution CT scan - someone will contact you to schedule this  Continue to use CPAP every night, minimum of 4-6 hours a night.  Change equipment every 30 days or as directed by DME. Wash your tubing with warm soap and water daily, hang to dry. Wash humidifier portion weekly.  Be aware of reduced alertness and do not drive or operate heavy machinery if experiencing this or drowsiness.   Follow up in 4-6 weeks with Dr. Halford Chessman or NP to go over CT scan results and see how inhaler is working. If symptoms do not improve or worsen, please contact office for sooner follow up or seek emergency care.

## 2022-12-26 NOTE — Assessment & Plan Note (Signed)
Excellent compliance and control of events on current CPAP settings.  She continues to receive good benefit from use.  Cautioned on safe driving practices.

## 2022-12-26 NOTE — Assessment & Plan Note (Signed)
Does not appear to be in acute exacerbation.  No significant cough or chest congestion.  See above plan regarding progressive DOE.

## 2022-12-26 NOTE — Assessment & Plan Note (Addendum)
Worsening DOE since COVID infection in November 2023.  Labs from December 2023 without any evidence of anemia or thyroid disease.  Pulmonary function testing today with decline in lung function when compared to 1 year ago by about 10%.  She now has some reversibility with positive postbronchodilator change in FVC.  DLCO remains stable at 71%.  Question if she has developed some reactive airway component following viral respiratory illness versus progression of bronchiectasis versus post-COVID fibrosis/ILD.  Will obtain HRCT for further evaluation.  We will trial her on bronchodilator regimen with as needed albuterol.  If she receives benefit from use, will consider maintenance therapy for her.  Patient Instructions  Trial albuterol inhaler 2 puffs every 6 hours as needed for shortness of breath or wheezing  Attend high resolution CT scan - someone will contact you to schedule this  Continue to use CPAP every night, minimum of 4-6 hours a night.  Change equipment every 30 days or as directed by DME. Wash your tubing with warm soap and water daily, hang to dry. Wash humidifier portion weekly.  Be aware of reduced alertness and do not drive or operate heavy machinery if experiencing this or drowsiness.   Follow up in 4-6 weeks with Dr. Halford Chessman or NP to go over CT scan results and see how inhaler is working. If symptoms do not improve or worsen, please contact office for sooner follow up or seek emergency care.

## 2022-12-27 ENCOUNTER — Ambulatory Visit: Payer: Medicare Other | Admitting: Nurse Practitioner

## 2023-01-08 ENCOUNTER — Ambulatory Visit
Admission: RE | Admit: 2023-01-08 | Discharge: 2023-01-08 | Disposition: A | Discharge status: Home / Self Care | Payer: Medicare Other | Source: Ambulatory Visit | Attending: Nurse Practitioner | Admitting: Nurse Practitioner

## 2023-01-08 DIAGNOSIS — R918 Other nonspecific abnormal finding of lung field: Secondary | ICD-10-CM | POA: Diagnosis not present

## 2023-01-08 DIAGNOSIS — I700 Atherosclerosis of aorta: Secondary | ICD-10-CM | POA: Insufficient documentation

## 2023-01-08 DIAGNOSIS — U099 Post covid-19 condition, unspecified: Secondary | ICD-10-CM | POA: Insufficient documentation

## 2023-01-08 DIAGNOSIS — I2510 Atherosclerotic heart disease of native coronary artery without angina pectoris: Secondary | ICD-10-CM | POA: Diagnosis not present

## 2023-01-08 DIAGNOSIS — J84112 Idiopathic pulmonary fibrosis: Secondary | ICD-10-CM | POA: Insufficient documentation

## 2023-01-08 DIAGNOSIS — R0609 Other forms of dyspnea: Secondary | ICD-10-CM | POA: Insufficient documentation

## 2023-01-08 DIAGNOSIS — J479 Bronchiectasis, uncomplicated: Secondary | ICD-10-CM | POA: Diagnosis not present

## 2023-01-10 ENCOUNTER — Ambulatory Visit (INDEPENDENT_AMBULATORY_CARE_PROVIDER_SITE_OTHER): Payer: Medicare Other

## 2023-01-10 DIAGNOSIS — I441 Atrioventricular block, second degree: Secondary | ICD-10-CM

## 2023-01-10 LAB — CUP PACEART REMOTE DEVICE CHECK
Battery Remaining Longevity: 82 mo
Battery Remaining Percentage: 64 %
Battery Voltage: 2.99 V
Brady Statistic AP VP Percent: 4.9 %
Brady Statistic AP VS Percent: 1.7 %
Brady Statistic AS VP Percent: 62 %
Brady Statistic AS VS Percent: 31 %
Brady Statistic RA Percent Paced: 5 %
Brady Statistic RV Percent Paced: 66 %
Date Time Interrogation Session: 20240410020020
Implantable Lead Connection Status: 753985
Implantable Lead Connection Status: 753985
Implantable Lead Implant Date: 20191022
Implantable Lead Implant Date: 20191022
Implantable Lead Location: 753859
Implantable Lead Location: 753860
Implantable Pulse Generator Implant Date: 20191022
Lead Channel Impedance Value: 560 Ohm
Lead Channel Impedance Value: 690 Ohm
Lead Channel Pacing Threshold Amplitude: 0.5 V
Lead Channel Pacing Threshold Amplitude: 0.625 V
Lead Channel Pacing Threshold Pulse Width: 0.5 ms
Lead Channel Pacing Threshold Pulse Width: 0.5 ms
Lead Channel Sensing Intrinsic Amplitude: 12 mV
Lead Channel Sensing Intrinsic Amplitude: 3.6 mV
Lead Channel Setting Pacing Amplitude: 0.875
Lead Channel Setting Pacing Amplitude: 2 V
Lead Channel Setting Pacing Pulse Width: 0.5 ms
Lead Channel Setting Sensing Sensitivity: 2 mV
Pulse Gen Model: 2272
Pulse Gen Serial Number: 9073848

## 2023-01-12 ENCOUNTER — Telehealth: Payer: Self-pay | Admitting: Cardiology

## 2023-01-12 NOTE — Telephone Encounter (Signed)
Spoke with patient informed her of pacemaker transmission results

## 2023-01-12 NOTE — Telephone Encounter (Signed)
LVM for patient to call device clinic back direct number given.  °

## 2023-01-12 NOTE — Telephone Encounter (Signed)
I spoke with the patient and let her know that her transmission was normal and that she has 6 years left on her battery. Alison Huffman also spoke with the patient and told her the same thing.

## 2023-01-12 NOTE — Progress Notes (Signed)
Post COVID inflammatory changes. CT imaging/PFTs will need monitoring moving forward. Can discuss further with Dr. Craige Cotta at her f/u on 5/1.

## 2023-01-12 NOTE — Telephone Encounter (Signed)
Patent called to check transmissions results were received for 4/10 pacer check.  Patient stated can leave VM to confirm.

## 2023-01-16 ENCOUNTER — Encounter: Payer: Self-pay | Admitting: Family Medicine

## 2023-01-16 ENCOUNTER — Ambulatory Visit (INDEPENDENT_AMBULATORY_CARE_PROVIDER_SITE_OTHER): Payer: Medicare Other | Admitting: Family Medicine

## 2023-01-16 VITALS — BP 132/78 | HR 64 | Temp 97.8°F | Ht 66.0 in | Wt 181.4 lb

## 2023-01-16 DIAGNOSIS — R600 Localized edema: Secondary | ICD-10-CM | POA: Diagnosis not present

## 2023-01-16 DIAGNOSIS — I10 Essential (primary) hypertension: Secondary | ICD-10-CM | POA: Diagnosis not present

## 2023-01-16 DIAGNOSIS — I8393 Asymptomatic varicose veins of bilateral lower extremities: Secondary | ICD-10-CM | POA: Diagnosis not present

## 2023-01-16 DIAGNOSIS — M2142 Flat foot [pes planus] (acquired), left foot: Secondary | ICD-10-CM

## 2023-01-16 DIAGNOSIS — M2141 Flat foot [pes planus] (acquired), right foot: Secondary | ICD-10-CM

## 2023-01-16 DIAGNOSIS — I75023 Atheroembolism of bilateral lower extremities: Secondary | ICD-10-CM | POA: Insufficient documentation

## 2023-01-16 NOTE — Progress Notes (Signed)
Marikay Alar, MD Phone: [phone]  Alison Huffman is a 82 y.o. female who presents today for f/u.  HYPERTENSION Disease Monitoring Home BP Monitoring 110s-120s/48-70s Chest pain- no    Dyspnea- on going sees pulmonology for follow-up soon Medications Compliance-  taking losartan. Lightheadedness-  rare   BMET    Component Value Date/Time   NA 142 09/12/2022 0000   K 4.5 09/12/2022 0000   CL 111 (A) 09/12/2022 0000   CO2 29 (A) 09/12/2022 0000   GLUCOSE 82 07/17/2022 0858   BUN 12 07/17/2022 0858   BUN 13 05/23/2021 1210   CREATININE 0.6 09/12/2022 0000   CREATININE 0.53 07/17/2022 0858   CALCIUM 9.0 09/12/2022 0000   GFRNONAA >60 12/22/2021 1256   GFRAA >60 05/03/2020 0513   Leg swelling: She continues to use compression stockings which have helped.  She does note some varicose veins.  No pain in her legs.  Pes planus: Patient reports bilateral flatfeet.  This has contributed to plantar fasciitis in the past.  She notes no pain currently.  She wonders about getting orthotics.  Bilateral toes turn blue: Patient notes this has been going on intermittently for several months.  She notes she has not touch them to see if they are cold when this occurs.  She notes she typically notices this when her feet are propped up when she does not have her compression stockings on.  She has no associated pain.  Bilateral thumb blueness: Patient reports on a few occasions each of her thumbs turned blue and the distal phalanx.  Notes this lasted about 10 days and then resolved.  It has not occurred in 6 weeks.  She wondered if it could be related to bruising from pulling her compression stockings on.  Social History   Tobacco Use  Smoking Status Never  Smokeless Tobacco Never  Tobacco Comments   07/22/2018 "smoked some in college; 1960s;  nothing since"    Current Outpatient Medications on File Prior to Visit  Medication Sig Dispense Refill   acetaminophen (TYLENOL) 325 MG tablet  Take 325-650 mg by mouth every 6 (six) hours as needed for mild pain or headache.      albuterol (VENTOLIN HFA) 108 (90 Base) MCG/ACT inhaler Inhale 2 puffs into the lungs every 6 (six) hours as needed for wheezing or shortness of breath. 8 g 2   Cholecalciferol 50 MCG (2000 UT) TABS Take 1 tablet by mouth daily.     clobetasol ointment (TEMOVATE) 0.05 %      furosemide (LASIX) 20 MG tablet TAKE ONE TABLET BY MOUTH EVERY DAY AS NEEDED FOR EDEMA 30 tablet 3   ketoconazole (NIZORAL) 2 % cream Apply 1 application topically daily. 15 g 0   losartan (COZAAR) 25 MG tablet TAKE 1 TABLET BY MOUTH ONCE DAILY 90 tablet 3   methylcellulose (CITRUCEL) oral powder      Naproxen Sodium (ALEVE PO) Take by mouth as needed.     polyethylene glycol (MIRALAX / GLYCOLAX) 17 g packet Take 17 g by mouth daily as needed for severe constipation. 14 each 0   rosuvastatin (CRESTOR) 20 MG tablet Take 1 tablet (20 mg total) by mouth daily. 90 tablet 3   tacrolimus (PROTOPIC) 0.1 % ointment Apply topically 2 (two) times daily as needed (for psoriasis).      Vitamin D, Ergocalciferol, 50 MCG (2000 UT) CAPS Take by mouth.     No current facility-administered medications on file prior to visit.     ROS  see history of present illness  Objective  Physical Exam Vitals:   01/16/23 0816  BP: 132/78  Pulse: 64  Temp: 97.8 F (36.6 C)  SpO2: 99%    BP Readings from Last 3 Encounters:  01/16/23 132/78  12/26/22 126/60  12/19/22 118/62   Wt Readings from Last 3 Encounters:  01/16/23 181 lb 6.4 oz (82.3 kg)  12/26/22 181 lb 9.6 oz (82.4 kg)  12/19/22 178 lb 3.2 oz (80.8 kg)    Physical Exam Constitutional:      General: She is not in acute distress.    Appearance: She is not diaphoretic.  Cardiovascular:     Rate and Rhythm: Normal rate and regular rhythm.     Heart sounds: Normal heart sounds.  Pulmonary:     Effort: Pulmonary effort is normal.     Breath sounds: Normal breath sounds.  Musculoskeletal:      Comments: Bilateral feet with no noted color changes, both thumbs with no noted color changes, good capillary refill in her thumbs, 2+ DP and PT pulses bilaterally, pes planus bilaterally  Skin:    General: Skin is warm and dry.  Neurological:     Mental Status: She is alert.      Assessment/Plan: Please see individual problem list.  Primary hypertension Assessment & Plan: Chronic issue.  Generally well-controlled.  Her diastolics are rarely lower than I would like.  She will monitor and if she starts to have more diastolics in the upper 40s or if she starts to feel lightheaded more frequently she will let me know and we will discontinue losartan.  For now she will continue losartan 25 mg daily.   Pes planus of both feet Assessment & Plan: Chronic issue.  Refer to podiatry to consider orthotics.  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  Pulses are good in her feet which would make a blood flow issue less likely because.  We will refer to vascular surgery to get their input.  Orders: -     Ambulatory referral to Vascular Surgery  Asymptomatic varicose veins of both lower extremities -     Ambulatory referral to Vascular Surgery  Bilateral leg edema Assessment & Plan: Chronic issue.  Possibly related to varicose veins though could also be related to lymphedema.  I have referred her to vascular surgery to get their input.      Return in about 3 months (around 04/17/2023).   Marikay Alar, MD [facility]

## 2023-01-16 NOTE — Assessment & Plan Note (Signed)
Chronic issue.  Possibly related to varicose veins though could also be related to lymphedema.  I have referred her to vascular surgery to get their input.

## 2023-01-16 NOTE — Assessment & Plan Note (Signed)
Chronic issue.  Generally well-controlled.  Her diastolics are rarely lower than I would like.  She will monitor and if she starts to have more diastolics in the upper 40s or if she starts to feel lightheaded more frequently she will let me know and we will discontinue losartan.  For now she will continue losartan 25 mg daily.

## 2023-01-16 NOTE — Progress Notes (Signed)
Called and left detailed msg on machine with results/recs ok per [facility]

## 2023-01-16 NOTE — Assessment & Plan Note (Signed)
Possibly related to Raynaud's though I would expect more of a white color prior to the blueness.  Pulses are good in her feet which would make a blood flow issue less likely because.  We will refer to vascular surgery to get their input.

## 2023-01-16 NOTE — Assessment & Plan Note (Signed)
Chronic issue.  Refer to podiatry to consider orthotics.

## 2023-01-16 NOTE — Patient Instructions (Signed)
Nice to see you. Podiatry and vascular surgery should contact you in the next 1 to 2 weeks..  You will hear from them in the next week or 2 please let us know. If your diastolic blood pressure starts to run in the 40s more frequently or if you start to have lightheadedness more frequently please let me know and we can have you stop the losartan.

## 2023-01-26 DIAGNOSIS — H43813 Vitreous degeneration, bilateral: Secondary | ICD-10-CM | POA: Diagnosis not present

## 2023-01-26 DIAGNOSIS — H47011 Ischemic optic neuropathy, right eye: Secondary | ICD-10-CM | POA: Diagnosis not present

## 2023-01-26 DIAGNOSIS — H2513 Age-related nuclear cataract, bilateral: Secondary | ICD-10-CM | POA: Diagnosis not present

## 2023-01-31 ENCOUNTER — Ambulatory Visit (INDEPENDENT_AMBULATORY_CARE_PROVIDER_SITE_OTHER): Payer: Medicare Other | Admitting: Pulmonary Disease

## 2023-01-31 ENCOUNTER — Encounter: Payer: Self-pay | Admitting: Pulmonary Disease

## 2023-01-31 VITALS — BP 148/84 | HR 67 | Temp 97.8°F | Ht 66.0 in | Wt 180.4 lb

## 2023-01-31 DIAGNOSIS — U099 Post covid-19 condition, unspecified: Secondary | ICD-10-CM | POA: Diagnosis not present

## 2023-01-31 DIAGNOSIS — J479 Bronchiectasis, uncomplicated: Secondary | ICD-10-CM | POA: Diagnosis not present

## 2023-01-31 DIAGNOSIS — G4733 Obstructive sleep apnea (adult) (pediatric): Secondary | ICD-10-CM

## 2023-01-31 DIAGNOSIS — R0609 Other forms of dyspnea: Secondary | ICD-10-CM | POA: Diagnosis not present

## 2023-01-31 NOTE — Patient Instructions (Signed)
Follow up in 6 months 

## 2023-01-31 NOTE — Progress Notes (Signed)
[facility]  Chief Complaint  Patient presents with   Follow-up    SOB with exertion. No cough or wheezing. CPAP is good no problems.     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  Brief Summary:  Alison Huffman is a 82 y.o. female with obstructive sleep apnea, and bronchiectasis.      Subjective:   She had COVID in November 2023.  PFT showed positive bronchodilator response.  High resolution CT chest showed mild changes of post-COVID pattern.  She has been using albuterol intermittent.  Was using more when she first got it, but hasn't needed to use as much over the past couple of weeks.  Not having cough, wheeze, sputum.  Gets winded only when she is rushing through activity, but usually recovers after resting a minute.  Has some sinus congestion but is mild.  Uses flonase prn.  Uses CPAP nightly.  Has nasal pillow mask.  No issues with mask fit or mouth dryness.  Physical Exam:   Appearance - well kempt   ENMT - no sinus tenderness, no oral exudate, no LAN, Mallampati 3 airway, no stridor  Respiratory - equal breath  sounds bilaterally, no wheezing or rales  CV - s1s2 regular rate and rhythm, no murmurs  Ext - no clubbing, no edema  Skin - no rashes  Psych - normal mood and affect      Pulmonary Tests:  PFT 12/26/22 >> FEV1 1.90 (94%), FEV1% 85, TLC 4.28 (85%), DLCO 71%, +BD  Chest Imaging:  CT chest 06/05/20 >> mild cylindrical BTX RLL, mild GGO Rt lung base CT chest 12/16/20 >> stable nodules, ATX CT chest 12/18/20 >> stable BTX RLL, faint GGO with subpleural reticulation in periphery RML and lower lobes, Rt base nodule now 4 mm (was 6 mm), stable RUL nodules 4 mm and 3 mm, calcified granulomas b/l, stable thin wall cyst LUL HRCT chest 06/19/22 >> cylindrical BTX, calcified granulomas, scattered nodules up to 5 mm, GGO resolved, air trapping HRCT chest 01/08/23 >> calcified granulomas, mild traction BTX, no change in nodules up to 4 mm, mild GGO in LUL, mild air trapping (mild sequela of COVID)  Sleep Tests:  PSG 01/24/18 >> AHI 24.1, SpO2 low 84.4%; CPAP 8 cm H2O Auto CPAP 12/31/22 to 01/29/23 >> used on 30 of 30 nights with average 8 hrs 6 min.  Average AHI 1.8 with median CPAP 8 and 95 th percentile CPAP 11 cm H2O  Cardiac Tests:  Echo 04/12/20 >> EF 55 to 60%, grade 1 DD, mod LA dilation  Social History:  She  reports that she has never smoked. She has never used smokeless tobacco. She reports current alcohol use.  She reports that she does not use drugs.  Family History:  Her family history includes Atrial fibrillation in her mother; Breast cancer in her maternal aunt, mother, and paternal aunt; Stroke in her father and mother.     Assessment/Plan:   Obstructive sleep apnea. - she is compliant with CPAP and reports benefit from therapy - gets supplies from [place] - she should be eligible for a new CPAP machine in 2025 - continue auto CPAP 5 to 15 cm H2O  Post-COVID dyspnea. - mild symptoms at present - prn albuterol  Right lower lobe bronchiectasis. - mild and likely  post-infectious - bronchial hygiene as needed  Allergic rhinitis. - prn flonase - can add astepro as needed  History of Guillain Barre Syndrome. - followed by Dr. Nita Sickle with [facility]  Second degree heart block s/p pacemaker. - followed by Dr. Hillis Range with [facility]  Time Spent Involved in Patient Care on Day of Examination:  25 minutes  Follow up:   Patient Instructions  Follow up in 6 months  Medication List:   Allergies as of 01/31/2023       Reactions   Etanercept    Guillain Baree   Nickel         Medication List        Accurate as of Jan 31, 2023  8:57 AM. If you have any questions, ask your nurse or doctor.          acetaminophen 325 MG tablet Commonly known as: TYLENOL Take 325-650 mg by mouth every 6 (six) hours as needed for mild pain or headache.   albuterol 108 (90 Base) MCG/ACT inhaler Commonly known as: VENTOLIN HFA Inhale 2 puffs into the lungs every 6 (six) hours as needed for wheezing or shortness of breath.   ALEVE PO Take by mouth as needed.   Cholecalciferol 50 MCG (2000 UT) Tabs Take 1 tablet by mouth daily.   Citrucel oral powder Generic drug: methylcellulose   clobetasol ointment 0.05 % Commonly known as: TEMOVATE   furosemide 20 MG tablet Commonly known as: LASIX TAKE ONE TABLET BY MOUTH EVERY DAY AS NEEDED FOR EDEMA   ketoconazole 2 % cream Commonly known as: NIZORAL Apply 1 application topically daily.   losartan 25 MG tablet Commonly known as: COZAAR TAKE 1 TABLET BY MOUTH ONCE DAILY   polyethylene glycol 17 g packet Commonly known as: MIRALAX / GLYCOLAX Take 17 g by mouth daily as needed for severe constipation.   rosuvastatin 20 MG tablet Commonly known as: Crestor Take 1 tablet (20 mg total) by mouth daily.   tacrolimus 0.1 % ointment Commonly known as: PROTOPIC Apply topically 2 (two) times daily as needed (for psoriasis).   Vitamin D (Ergocalciferol) 50 MCG (2000 UT)  Caps Take by mouth.        Signature:  Coralyn Helling, MD [facility] Pager - [phone] 01/31/2023, 8:57 AM

## 2023-02-07 ENCOUNTER — Other Ambulatory Visit (INDEPENDENT_AMBULATORY_CARE_PROVIDER_SITE_OTHER): Payer: Self-pay | Admitting: Nurse Practitioner

## 2023-02-07 DIAGNOSIS — I83893 Varicose veins of bilateral lower extremities with other complications: Secondary | ICD-10-CM

## 2023-02-09 ENCOUNTER — Ambulatory Visit (INDEPENDENT_AMBULATORY_CARE_PROVIDER_SITE_OTHER): Payer: Medicare Other | Admitting: Nurse Practitioner

## 2023-02-09 ENCOUNTER — Encounter (INDEPENDENT_AMBULATORY_CARE_PROVIDER_SITE_OTHER): Payer: Self-pay | Admitting: Nurse Practitioner

## 2023-02-09 ENCOUNTER — Ambulatory Visit (INDEPENDENT_AMBULATORY_CARE_PROVIDER_SITE_OTHER): Payer: Medicare Other

## 2023-02-09 VITALS — BP 128/70 | HR 73 | Resp 16 | Ht 66.0 in | Wt 182.0 lb

## 2023-02-09 DIAGNOSIS — I5032 Chronic diastolic (congestive) heart failure: Secondary | ICD-10-CM

## 2023-02-09 DIAGNOSIS — I10 Essential (primary) hypertension: Secondary | ICD-10-CM | POA: Diagnosis not present

## 2023-02-09 DIAGNOSIS — I75023 Atheroembolism of bilateral lower extremities: Secondary | ICD-10-CM

## 2023-02-09 DIAGNOSIS — I83893 Varicose veins of bilateral lower extremities with other complications: Secondary | ICD-10-CM | POA: Diagnosis not present

## 2023-02-13 ENCOUNTER — Encounter: Payer: Self-pay | Admitting: Podiatry

## 2023-02-13 ENCOUNTER — Ambulatory Visit (INDEPENDENT_AMBULATORY_CARE_PROVIDER_SITE_OTHER): Payer: Medicare Other | Admitting: Podiatry

## 2023-02-13 DIAGNOSIS — Q667 Congenital pes cavus, unspecified foot: Secondary | ICD-10-CM

## 2023-02-13 DIAGNOSIS — Q6672 Congenital pes cavus, left foot: Secondary | ICD-10-CM | POA: Diagnosis not present

## 2023-02-13 DIAGNOSIS — Q6671 Congenital pes cavus, right foot: Secondary | ICD-10-CM | POA: Diagnosis not present

## 2023-02-13 NOTE — Progress Notes (Signed)
Subjective:  Patient ID: Alison Huffman, female    DOB: 04-02-1941,  MRN: [id number]  Chief Complaint  Patient presents with   Foot Orthotics    Pt would like to talk about possibly getting orthotics     82 y.o. female presents with the above complaint.  Patient presents with pes cavus foot structure.  Patient states that she normally wears orthotics 1.  She wanted to get treatment options to get another pair of orthotics she denies any other acute complaints.  She is not diabetic.  She has GBS.  Denies any other acute issues pain scale 7 out of 10   Review of Systems: Negative except as noted in the HPI. Denies N/V/F/Ch.  Past Medical History:  Diagnosis Date   Ankle pain, right    Atypical chest pain    a. 10/2018 MV: EF 57%. No ischemia/infarct.   Coronary artery disease    Diastolic dysfunction    a. 04/2018 Echo: EF 55-60%, no rwma, Gr1 DD. Triv AI, mild MR. Mod dil LA. Nl RV fxn. PASP nl.   GERD (gastroesophageal reflux disease)    Guillain-Barre (HCC)    Headache    "dull one sometimes daily, at least weekly in last couple months" (07/22/2018)   Hyperlipidemia    Hypertension    Inflammatory neuropathy (HCC)    Joint pain in fingers of right hand    Lichen sclerosus    Osteopenia    Pneumonia 2012? 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 07/2018 s/p SJM Assurity MRI model PM2271 (Ser# [id number]).   Sleep apnea    "using nasal pilllows since ~ 12/2017" (07/22/2018)   TMJ (dislocation of temporomandibular joint)     Current Outpatient Medications:    acetaminophen (TYLENOL) 325 MG tablet, Take 325-650 mg by mouth every 6 (six) hours as needed for mild pain or headache. , Disp: , Rfl:    albuterol (VENTOLIN HFA) 108 (90 Base) MCG/ACT inhaler, Inhale 2 puffs into  the lungs every 6 (six) hours as needed for wheezing or shortness of breath., Disp: 8 g, Rfl: 2   Cholecalciferol 50 MCG (2000 UT) TABS, Take 1 tablet by mouth daily., Disp: , Rfl:    clobetasol ointment (TEMOVATE) 0.05 %, , Disp: , Rfl:    furosemide (LASIX) 20 MG tablet, TAKE ONE TABLET BY MOUTH EVERY DAY AS NEEDED FOR EDEMA, Disp: 30 tablet, Rfl: 3   ketoconazole (NIZORAL) 2 % cream, Apply 1 application topically daily., Disp: 15 g, Rfl: 0   losartan (COZAAR) 25 MG tablet, TAKE 1 TABLET BY MOUTH ONCE DAILY, Disp: 90 tablet, Rfl: 3   methylcellulose (CITRUCEL) oral powder, , Disp: , Rfl:    Naproxen Sodium (ALEVE PO), Take by mouth as needed., Disp: , Rfl:    polyethylene glycol (MIRALAX / GLYCOLAX) 17 g packet, Take 17 g by mouth daily as needed for severe constipation., Disp: 14 each, Rfl: 0   rosuvastatin (CRESTOR) 20 MG tablet, Take 1 tablet (20 mg total) by mouth daily., Disp: 90 tablet, Rfl: 3   tacrolimus (PROTOPIC) 0.1 % ointment, Apply topically 2 (two) times daily as needed (for psoriasis). , Disp: , Rfl:    Vitamin D, Ergocalciferol, 50 MCG (2000 UT)  CAPS, Take by mouth. (Patient not taking: Reported on 01/31/2023), Disp: , Rfl:   Social History   Tobacco Use  Smoking Status Never  Smokeless Tobacco Never  Tobacco Comments   07/22/2018 "smoked some in college; 1960s;  nothing since"    Allergies  Allergen Reactions   Etanercept     Guillain Baree   Nickel    Objective:  There were no vitals filed for this visit. There is no height or weight on file to calculate BMI. Constitutional Well developed. Well nourished.  Vascular Dorsalis pedis pulses palpable bilaterally. Posterior tibial pulses palpable bilaterally. Capillary refill normal to all digits.  No cyanosis or clubbing noted. Pedal hair growth normal.  Neurologic Normal speech. Oriented to person, place, and time. Epicritic sensation to light touch grossly present bilaterally.  Dermatologic Nails well groomed  and normal in appearance. No open wounds. No skin lesions.  Orthopedic: Gait examination shows pes cavus foot structure with calcaneal varus deformity noted.  Reducible deformity with Coleman block test.   Radiographs: None Assessment:   1. Pes cavus    Plan:  Patient was evaluated and treated and all questions answered.  Pes cavus -I explained to patient the etiology of pes cavus and relationship with Planter fasciitis and various treatment options were discussed.  Given patient foot structure in the setting of Planter fasciitis I believe patient will benefit from custom-made orthotics to help control the hindfoot motion support the arch of the foot and take the stress away from plantar fascial.  Patient agrees with the plan like to proceed with orthotics -Patient was casted for orthotics   Return in about 3 months (around 05/16/2023).

## 2023-02-14 DIAGNOSIS — K219 Gastro-esophageal reflux disease without esophagitis: Secondary | ICD-10-CM | POA: Diagnosis not present

## 2023-02-14 DIAGNOSIS — K5909 Other constipation: Secondary | ICD-10-CM | POA: Diagnosis not present

## 2023-02-14 DIAGNOSIS — K828 Other specified diseases of gallbladder: Secondary | ICD-10-CM | POA: Diagnosis not present

## 2023-02-14 DIAGNOSIS — K449 Diaphragmatic hernia without obstruction or gangrene: Secondary | ICD-10-CM | POA: Diagnosis not present

## 2023-02-14 DIAGNOSIS — R10814 Left lower quadrant abdominal tenderness: Secondary | ICD-10-CM | POA: Diagnosis not present

## 2023-02-14 DIAGNOSIS — K2970 Gastritis, unspecified, without bleeding: Secondary | ICD-10-CM | POA: Diagnosis not present

## 2023-02-16 NOTE — Progress Notes (Signed)
Remote pacemaker transmission.   

## 2023-02-27 ENCOUNTER — Encounter (INDEPENDENT_AMBULATORY_CARE_PROVIDER_SITE_OTHER): Payer: Self-pay | Admitting: Nurse Practitioner

## 2023-02-27 NOTE — Progress Notes (Unsigned)
Subjective:    Patient ID: Alison Huffman, female    DOB: Jan 16, 1941, 82 y.o.   MRN: [id number] Chief Complaint  Patient presents with   New Patient (Initial Visit)    Ref Birdie Sons consult lymph edema, toes turn blue intermittently    The patient is a 82 year old female who presents today due to discoloration of bilateral lower extremities.  She notes that the discoloration is more evident when she is taking off her compression socks and if her feet are planted on the floor.  With elevation this tends to subside.  She also notes some discoloration on her thumbs.  In reviewing pictures that has subsided over the last few weeks.  The patient suspects it may be due to pulling up her compression socks.  Patient has no evidence of DVT or superficial thrombophlebitis bilaterally.  There is evidence of deep venous insufficiency or superficial venous reflux bilaterally.  There is also a Baker's cyst noted in the left lower extremity    Review of Systems  Cardiovascular:  Positive for leg swelling.  Hematological:  Bruises/bleeds easily.  All other systems reviewed and are negative.      Objective:   Physical Exam Vitals reviewed.  HENT:     Head: Normocephalic.  Cardiovascular:     Rate and Rhythm: Normal rate.     Pulses:          Dorsalis pedis pulses are 1+ on the right side and 1+ on the left side.  Pulmonary:     Effort: Pulmonary effort is normal.  Skin:    General: Skin is warm and dry.  Neurological:     Mental Status: She is alert and oriented to person, place, and time.  Psychiatric:        Mood and Affect: Mood normal.        Behavior: Behavior normal.        Thought Content: Thought content normal.        Judgment: Judgment normal.     BP 128/70 (BP Location: Right Arm)   Pulse 73   Resp 16   Ht 5\' 6"  (1.676 m)   Wt 182 lb (82.6 kg)   BMI 29.38 kg/m   Past Medical History:  Diagnosis Date   Ankle pain, right    Atypical chest pain    a. 10/2018 MV: EF  57%. No ischemia/infarct.   Coronary artery disease    Diastolic dysfunction    a. 04/2018 Echo: EF 55-60%, no rwma, Gr1 DD. Triv AI, mild MR. Mod dil LA. Nl RV fxn. PASP nl.   GERD (gastroesophageal reflux disease)    Guillain-Barre (HCC)    Headache    "dull one sometimes daily, at least weekly in last couple months" (07/22/2018)   Hyperlipidemia    Hypertension    Inflammatory neuropathy (HCC)    Joint pain in fingers of right hand    Lichen sclerosus    Osteopenia    Pneumonia 2012? 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 07/2018 s/p SJM Assurity MRI model PM2271 (Ser# [id number]).   Sleep apnea    "using nasal pilllows since ~ 12/2017" (07/22/2018)   TMJ (dislocation of temporomandibular joint)  Social History   Socioeconomic History   Marital status: Single    Spouse name: Not on file   Number of children: Not on file   Years of education: Not on file   Highest education level: Bachelor's degree (e.g., BA, AB, BS)  Occupational History   Not on file  Tobacco Use   Smoking status: Never   Smokeless tobacco: Never   Tobacco comments:    07/22/2018 "smoked some in college; 1960s;  nothing since"  Vaping Use   Vaping Use: Never used  Substance and Sexual Activity   Alcohol use: Yes    Comment: rarely   Drug use: Never   Sexual activity: Not Currently    Birth control/protection: Post-menopausal  Other Topics Concern   Not on file  Social History Narrative   Right Handed   Lives in an apartment. 5th floor but has elevator    Drinks Caffeine.    Social Determinants of Health   Financial Resource Strain: Low Risk  (01/12/2023)   Overall Financial Resource Strain (CARDIA)    Difficulty of Paying Living Expenses: Not hard at all  Food Insecurity: No Food  Insecurity (01/12/2023)   Hunger Vital Sign    Worried About Running Out of Food in the Last Year: Never true    Ran Out of Food in the Last Year: Never true  Transportation Needs: No Transportation Needs (01/12/2023)   PRAPARE - Administrator, Civil Service (Medical): No    Lack of Transportation (Non-Medical): No  Physical Activity: Insufficiently Active (01/12/2023)   Exercise Vital Sign    Days of Exercise per Week: 2 days    Minutes of Exercise per Session: 10 min  Stress: No Stress Concern Present (01/12/2023)   [employer] of Occupational Health - Occupational Stress Questionnaire    Feeling of Stress : Not at all  Social Connections: Unknown (01/12/2023)   Social Connection and Isolation Panel [NHANES]    Frequency of Communication with Friends and Family: More than three times a week    Frequency of Social Gatherings with Friends and Family: More than three times a week    Attends Religious Services: More than 4 times per year    Active Member of [employer] or Organizations: Yes    Attends Banker Meetings: More than 4 times per year    Marital Status: Patient declined  Intimate Partner Violence: Not At Risk (11/13/2022)   Humiliation, Afraid, Rape, and Kick questionnaire    Fear of Current or Ex-Partner: No    Emotionally Abused: No    Physically Abused: No    Sexually Abused: No    Past Surgical History:  Procedure Laterality Date   APPENDECTOMY     BREAST CYST ASPIRATION Bilateral    BREAST CYST EXCISION Right 1978   benign   COLONOSCOPY     COLONOSCOPY WITH PROPOFOL N/A 07/10/2019   Procedure: COLONOSCOPY WITH PROPOFOL;  Surgeon: Christena Deem, MD;  Location: [facility] ENDOSCOPY;  Service: Endoscopy;  Laterality: N/A;   ESOPHAGOGASTRODUODENOSCOPY (EGD) WITH PROPOFOL N/A 07/10/2019   Procedure: ESOPHAGOGASTRODUODENOSCOPY (EGD) WITH PROPOFOL;  Surgeon: Christena Deem, MD;  Location: [facility] ENDOSCOPY;  Service: Endoscopy;  Laterality: N/A;    HYSTEROSCOPY WITH D & C N/A 11/05/2018   Procedure: DILATATION AND CURETTAGE /HYSTEROSCOPY;  Surgeon: Natale Milch, MD;  Location: [facility] ORS;  Service: Gynecology;  Laterality: N/A;   INSERT / REPLACE / REMOVE PACEMAKER     PACEMAKER IMPLANT N/A 07/23/2018  SJM Assurity 2272 implanted by Dr Johney Frame for mobitz II second degree AV block   UTERINE POLYPS REMOVAL      Family History  Problem Relation Age of Onset   Stroke Mother    Atrial fibrillation Mother    Breast cancer Mother    Stroke Father    Breast cancer Maternal Aunt    Breast cancer Paternal Aunt     Allergies  Allergen Reactions   Etanercept     Guillain Baree   Nickel        Latest Ref Rng & Units 09/12/2022   12:00 AM 12/22/2021   12:56 PM 05/23/2021    4:09 PM  CBC  WBC  5.4     5.4  6.4   Hemoglobin 12.0 - 16.0 12.6     12.6  13.3   Hematocrit 36 - 46 37     39.2  40.4   Platelets 150 - 400 K/uL 227     232  224      This result is from an external source.      CMP     Component Value Date/Time   NA 142 09/12/2022 0000   K 4.5 09/12/2022 0000   CL 111 (A) 09/12/2022 0000   CO2 29 (A) 09/12/2022 0000   GLUCOSE 82 07/17/2022 0858   BUN 12 07/17/2022 0858   BUN 13 05/23/2021 1210   CREATININE 0.6 09/12/2022 0000   CREATININE 0.53 07/17/2022 0858   CALCIUM 9.0 09/12/2022 0000   PROT 6.8 07/17/2022 0858   ALBUMIN 3.5 09/12/2022 0000   AST 15 09/12/2022 0000   ALT 18 09/12/2022 0000   ALKPHOS 113 09/12/2022 0000   BILITOT 0.6 07/17/2022 0858   GFRNONAA >60 12/22/2021 1256   GFRAA >60 05/03/2020 0513     No results found.     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  She has notable significant superficial venous reflux.  We discussed the discoloration in the setting as well as possible treatment options for the varicosities.  We discussed conservative therapies including use of  compression, elevation and activity versus endovenous laser ablation for treatment of varicosities and results.  Additionally if swelling is limiting following discussion of these options the patient states she think about this to move forward with her decision.  2. Primary hypertension Continue antihypertensive medications as already ordered, these medications have been reviewed and there are no changes at this time.  3. Chronic diastolic (congestive) heart failure (HCC) This also likely plays a role somewhat in the swelling and discoloration  Current Outpatient Medications on File Prior to Visit  Medication Sig Dispense Refill   acetaminophen (TYLENOL) 325 MG tablet Take 325-650 mg by mouth every 6 (six) hours as needed for mild pain or headache.      albuterol (VENTOLIN HFA) 108 (90 Base) MCG/ACT inhaler Inhale 2 puffs into the lungs every 6 (six) hours as needed for wheezing or shortness of breath. 8 g 2   Cholecalciferol 50 MCG (2000 UT) TABS Take 1 tablet by mouth daily.     clobetasol ointment (TEMOVATE) 0.05 %      furosemide (LASIX) 20 MG tablet TAKE ONE TABLET BY MOUTH EVERY DAY AS NEEDED FOR EDEMA 30 tablet 3   ketoconazole (NIZORAL) 2 % cream Apply 1 application topically daily. 15 g 0   losartan (COZAAR) 25 MG tablet TAKE 1 TABLET BY MOUTH  ONCE DAILY 90 tablet 3   methylcellulose (CITRUCEL) oral powder      Naproxen Sodium (ALEVE PO) Take by mouth as needed.     polyethylene glycol (MIRALAX / GLYCOLAX) 17 g packet Take 17 g by mouth daily as needed for severe constipation. 14 each 0   rosuvastatin (CRESTOR) 20 MG tablet Take 1 tablet (20 mg total) by mouth daily. 90 tablet 3   tacrolimus (PROTOPIC) 0.1 % ointment Apply topically 2 (two) times daily as needed (for psoriasis).      Vitamin D, Ergocalciferol, 50 MCG (2000 UT) CAPS Take by mouth. (Patient not taking: Reported on 01/31/2023)     No current facility-administered medications on file prior to visit.    There are no  Patient Instructions on file for this visit. No follow-ups on file.   Georgiana Spinner, NP

## 2023-03-14 ENCOUNTER — Telehealth (INDEPENDENT_AMBULATORY_CARE_PROVIDER_SITE_OTHER): Payer: Self-pay | Admitting: Nurse Practitioner

## 2023-03-14 NOTE — Telephone Encounter (Signed)
Spoke with pt after she LVMOM. Pt wants to go ahead and get on the list for a laser ablation. I advised that I would put her on the list and as soon as I got to her, I would be getting a prior auth and giving her a call. Pt acknowledged and nothing further is needed at this time.

## 2023-04-02 ENCOUNTER — Telehealth: Payer: Self-pay | Admitting: Cardiovascular Disease

## 2023-04-02 NOTE — Telephone Encounter (Signed)
Pt states that for the last 2 t 3 weeks she has been waking up with jaw and teeth pain. She would like a callback regarding this matter. Please advise

## 2023-04-02 NOTE — Telephone Encounter (Signed)
Spoke with patient and she denied any other symptoms. This only affected her jaw and teeth and states that it only lasts just a few minutes. Reviewed provider recommendations and strict ED precautions. She verbalized understanding with no further questions at this time.        [name], NP  [name], RN Caller: Unspecified (Today,  8:14 AM) Does she have any other associated symptoms? If she has just teeth/jaw pain for the last 2-3 weeks she may need to see her dentist as well. Has she tried to take anything such as Tylenol for the discomfort?

## 2023-04-02 NOTE — Telephone Encounter (Signed)
Spoke with patient and she reports the last 2-3 weeks she has been having pain in her jaw along with teeth pain. She does report having sleep apnea and wears cpap. Reviewed that this could be the result of clinching or grinding her teeth in her sleep. She really wants provider input so reassured her that I would forward to them for further review and any further review and any recommendations. She verbalized understanding with no other questions for now.

## 2023-04-11 ENCOUNTER — Ambulatory Visit (INDEPENDENT_AMBULATORY_CARE_PROVIDER_SITE_OTHER): Payer: Medicare Other

## 2023-04-11 DIAGNOSIS — I441 Atrioventricular block, second degree: Secondary | ICD-10-CM | POA: Diagnosis not present

## 2023-04-11 LAB — CUP PACEART REMOTE DEVICE CHECK
Battery Remaining Longevity: 79 mo
Battery Remaining Percentage: 61 %
Battery Voltage: 2.99 V
Brady Statistic AP VP Percent: 5.6 %
Brady Statistic AP VS Percent: 1 %
Brady Statistic AS VP Percent: 77 %
Brady Statistic AS VS Percent: 16 %
Brady Statistic RA Percent Paced: 5.2 %
Brady Statistic RV Percent Paced: 82 %
Date Time Interrogation Session: 20240710020015
Implantable Lead Connection Status: 753985
Implantable Lead Connection Status: 753985
Implantable Lead Implant Date: 20191022
Implantable Lead Implant Date: 20191022
Implantable Lead Location: 753859
Implantable Lead Location: 753860
Implantable Pulse Generator Implant Date: 20191022
Lead Channel Impedance Value: 580 Ohm
Lead Channel Impedance Value: 660 Ohm
Lead Channel Pacing Threshold Amplitude: 0.5 V
Lead Channel Pacing Threshold Amplitude: 0.625 V
Lead Channel Pacing Threshold Pulse Width: 0.5 ms
Lead Channel Pacing Threshold Pulse Width: 0.5 ms
Lead Channel Sensing Intrinsic Amplitude: 12 mV
Lead Channel Sensing Intrinsic Amplitude: 2.9 mV
Lead Channel Setting Pacing Amplitude: 0.875
Lead Channel Setting Pacing Amplitude: 2 V
Lead Channel Setting Pacing Pulse Width: 0.5 ms
Lead Channel Setting Sensing Sensitivity: 2 mV
Pulse Gen Model: 2272
Pulse Gen Serial Number: 9073848

## 2023-04-18 ENCOUNTER — Encounter: Payer: Self-pay | Admitting: Family Medicine

## 2023-04-18 ENCOUNTER — Ambulatory Visit: Payer: Medicare Other | Admitting: Family Medicine

## 2023-04-18 VITALS — BP 124/68 | HR 63 | Temp 98.3°F | Ht 66.0 in | Wt 180.8 lb

## 2023-04-18 DIAGNOSIS — M810 Age-related osteoporosis without current pathological fracture: Secondary | ICD-10-CM

## 2023-04-18 DIAGNOSIS — E782 Mixed hyperlipidemia: Secondary | ICD-10-CM

## 2023-04-18 DIAGNOSIS — R6884 Jaw pain: Secondary | ICD-10-CM

## 2023-04-18 DIAGNOSIS — R7303 Prediabetes: Secondary | ICD-10-CM

## 2023-04-18 DIAGNOSIS — I10 Essential (primary) hypertension: Secondary | ICD-10-CM | POA: Diagnosis not present

## 2023-04-18 DIAGNOSIS — R0609 Other forms of dyspnea: Secondary | ICD-10-CM

## 2023-04-18 DIAGNOSIS — I441 Atrioventricular block, second degree: Secondary | ICD-10-CM | POA: Diagnosis not present

## 2023-04-18 LAB — BASIC METABOLIC PANEL
BUN: 14 mg/dL (ref 6–23)
CO2: 28 mEq/L (ref 19–32)
Calcium: 10.1 mg/dL (ref 8.4–10.5)
Chloride: 104 mEq/L (ref 96–112)
Creatinine, Ser: 0.62 mg/dL (ref 0.40–1.20)
GFR: 82.33 mL/min (ref >60.00)
Glucose, Bld: 78 mg/dL (ref 70–99)
Potassium: 4.6 mEq/L (ref 3.5–5.1)
Sodium: 139 mEq/L (ref 135–145)

## 2023-04-18 LAB — HEMOGLOBIN A1C: Hgb A1c MFr Bld: 6.2 % (ref 4.6–6.5)

## 2023-04-18 LAB — HEPATIC FUNCTION PANEL
ALT: 13 U/L (ref 0–35)
AST: 16 U/L (ref 0–37)
Albumin: 4.3 g/dL (ref 3.5–5.2)
Alkaline Phosphatase: 89 U/L (ref 39–117)
Bilirubin, Direct: 0.1 mg/dL (ref 0.0–0.3)
Total Bilirubin: 0.6 mg/dL (ref 0.2–1.2)
Total Protein: 7.2 g/dL (ref 6.0–8.3)

## 2023-04-18 LAB — LDL CHOLESTEROL, DIRECT: Direct LDL: 70 mg/dL

## 2023-04-18 MED ORDER — LOSARTAN POTASSIUM 25 MG PO TABS: 25.0000 mg | ORAL_TABLET | Freq: Every day | ORAL | 3 refills | Status: DC

## 2023-04-18 NOTE — Assessment & Plan Note (Signed)
Chronic issue.  Progressively improving.  Suspect related to her prior COVID infection.  She will likely continue to improve.  We will continue to monitor.

## 2023-04-18 NOTE — Assessment & Plan Note (Signed)
Undetermined cause.  Potentially could have been a dental issue.  It has resolved at this time.  She will let us know if it recurs.

## 2023-04-18 NOTE — Assessment & Plan Note (Signed)
Chronic issue.  Continue Crestor 20 mg daily.  Check LDL and hepatic function panel.

## 2023-04-18 NOTE — Assessment & Plan Note (Addendum)
Chronic issue.  Adequately controlled. Patient will continue losartan 25 mg daily.

## 2023-04-18 NOTE — Progress Notes (Signed)
Marikay Alar, MD Phone: [phone]  Alison Huffman is a 82 y.o. female who presents today for f/u.  HYPERTENSION Disease Monitoring Home BP Monitoring <140/80 Chest pain- no    Dyspnea- notes this has improved Medications Compliance-  taking losartan.   Edema- some yesterday though this resolved with lasix, no orthopnea, no PND BMET    Component Value Date/Time   NA 142 09/12/2022 0000   K 4.5 09/12/2022 0000   CL 111 (A) 09/12/2022 0000   CO2 29 (A) 09/12/2022 0000   GLUCOSE 82 07/17/2022 0858   BUN 12 07/17/2022 0858   BUN 13 05/23/2021 1210   CREATININE 0.6 09/12/2022 0000   CREATININE 0.53 07/17/2022 0858   CALCIUM 9.0 09/12/2022 0000   GFRNONAA >60 12/22/2021 1256   GFRAA >60 05/03/2020 0513   HYPERLIPIDEMIA Symptoms Chest pain on exertion:  no    Medications: Compliance- taking crestor Right upper quadrant pain- no  Muscle aches- no Lipid Panel     Component Value Date/Time   CHOL 227 (H) 07/17/2022 0858   CHOL 156 04/11/2018 0900   TRIG 88.0 07/17/2022 0858   HDL 53.20 07/17/2022 0858   HDL 58 04/11/2018 0900   CHOLHDL 4 07/17/2022 0858   VLDL 17.6 07/17/2022 0858   LDLCALC 157 (H) 07/17/2022 0858   LDLCALC 84 04/11/2018 0900   LDLDIRECT 94.0 10/06/2019 0906   LABVLDL 14 04/11/2018 0900   Second-degree AV block: Patient has a pacemaker in place.  No palpitations.  She reports she sees cardiology and August.  Dyspnea on exertion: Patient reports this has progressively been improving.  She notes she saw pulmonology and they advised her it was probably related to COVID that she had previously.  Jaw pain: Patient notes this occurred 2 to 3 weeks ago.  She was unsure if it was her jaw or teeth.  She notes seeing her dentist and she had some gum soreness and was irrigated.  Notes no recurrence of the pain since then.  Social History   Tobacco Use  Smoking Status Never  Smokeless Tobacco Never  Tobacco Comments   07/22/2018 "smoked some in college;  1960s;  nothing since"    Current Outpatient Medications on File Prior to Visit  Medication Sig Dispense Refill   acetaminophen (TYLENOL) 325 MG tablet Take 325-650 mg by mouth every 6 (six) hours as needed for mild pain or headache.      albuterol (VENTOLIN HFA) 108 (90 Base) MCG/ACT inhaler Inhale 2 puffs into the lungs every 6 (six) hours as needed for wheezing or shortness of breath. 8 g 2   Cholecalciferol 50 MCG (2000 UT) TABS Take 1 tablet by mouth daily.     clobetasol ointment (TEMOVATE) 0.05 %      furosemide (LASIX) 20 MG tablet TAKE ONE TABLET BY MOUTH EVERY DAY AS NEEDED FOR EDEMA 30 tablet 3   ketoconazole (NIZORAL) 2 % cream Apply 1 application topically daily. 15 g 0   methylcellulose (CITRUCEL) oral powder      Naproxen Sodium (ALEVE PO) Take by mouth as needed.     polyethylene glycol (MIRALAX / GLYCOLAX) 17 g packet Take 17 g by mouth daily as needed for severe constipation. 14 each 0   rosuvastatin (CRESTOR) 20 MG tablet Take 1 tablet (20 mg total) by mouth daily. 90 tablet 3   tacrolimus (PROTOPIC) 0.1 % ointment Apply topically 2 (two) times daily as needed (for psoriasis).      Vitamin D, Ergocalciferol, 50 MCG (2000 UT)  CAPS Take by mouth.     No current facility-administered medications on file prior to visit.     ROS see history of present illness  Objective  Physical Exam Vitals:   04/18/23 1118  BP: 124/68  Pulse: 63  Temp: 98.3 F (36.8 C)  SpO2: 99%    BP Readings from Last 3 Encounters:  04/18/23 124/68  02/09/23 128/70  01/31/23 (!) 148/84   Wt Readings from Last 3 Encounters:  04/18/23 180 lb 12.8 oz (82 kg)  02/09/23 182 lb (82.6 kg)  01/31/23 180 lb 6.4 oz (81.8 kg)    Physical Exam Constitutional:      General: She is not in acute distress.    Appearance: She is not diaphoretic.  Cardiovascular:     Rate and Rhythm: Normal rate and regular rhythm.     Heart sounds: Normal heart sounds.  Pulmonary:     Effort: Pulmonary effort  is normal.     Breath sounds: Normal breath sounds.  Musculoskeletal:     Right lower leg: No edema.     Left lower leg: No edema.  Skin:    General: Skin is warm and dry.  Neurological:     Mental Status: She is alert.      Assessment/Plan: Please see individual problem list.  Primary hypertension Assessment & Plan: Chronic issue.  Adequately controlled. Patient will continue losartan 25 mg daily.   Orders: -     Losartan Potassium; Take 1 tablet (25 mg total) by mouth daily.  Dispense: 90 tablet; Refill: 3 -     Basic metabolic panel  Second degree AV block, Mobitz type II Assessment & Plan: Chronic issue.  Pacemaker in place.  She will see cardiology as planned.   Prediabetes -     Hemoglobin A1c  Mixed hyperlipidemia Assessment & Plan: Chronic issue.  Continue Crestor 20 mg daily.  Check LDL and hepatic function panel.  Orders: -     LDL cholesterol, direct -     Hepatic function panel  Age-related osteoporosis without current pathological fracture Assessment & Plan: Chronic issue.  Patient previously declined medication treatment for this and she continues to decline this.  At this point there is no reason to repeat a DEXA scan if she is not going to consider prescription treatment for this.  She will let us know if she changes her mind.   Dyspnea on exertion Assessment & Plan: Chronic issue.  Progressively improving.  Suspect related to her prior COVID infection.  She will likely continue to improve.  We will continue to monitor.   Jaw pain Assessment & Plan: Undetermined cause.  Potentially could have been a dental issue.  It has resolved at this time.  She will let us know if it recurs.     Return in about 6 months (around 10/19/2023).   Marikay Alar, MD [facility]

## 2023-04-18 NOTE — Assessment & Plan Note (Signed)
Chronic issue.  Patient previously declined medication treatment for this and she continues to decline this.  At this point there is no reason to repeat a DEXA scan if she is not going to consider prescription treatment for this.  She will let us know if she changes her mind.

## 2023-04-18 NOTE — Assessment & Plan Note (Signed)
Chronic issue.  Pacemaker in place.  She will see cardiology as planned.

## 2023-05-02 NOTE — Progress Notes (Signed)
Remote pacemaker transmission.   

## 2023-05-14 ENCOUNTER — Ambulatory Visit (INDEPENDENT_AMBULATORY_CARE_PROVIDER_SITE_OTHER): Payer: Medicare Other | Admitting: Podiatry

## 2023-05-14 ENCOUNTER — Encounter: Payer: Self-pay | Admitting: Podiatry

## 2023-05-14 DIAGNOSIS — Q667 Congenital pes cavus, unspecified foot: Secondary | ICD-10-CM

## 2023-05-14 DIAGNOSIS — Z4789 Encounter for other orthopedic aftercare: Secondary | ICD-10-CM

## 2023-05-18 ENCOUNTER — Ambulatory Visit: Payer: Medicare Other | Admitting: Podiatry

## 2023-05-20 NOTE — Progress Notes (Signed)
  Subjective:  Patient ID: Alison Huffman, female    DOB: Jan 25, 1941,  MRN: [id number]  Alison Huffman presents to clinic today for:  orthotic pickup . Patient has dx of pes cavus as well as h/o GBS post Enbrel infusion.  PCP is Glori Luis, MD.  Allergies  Allergen Reactions   Etanercept     Guillain Baree   Nickel     Review of Systems: Negative except as noted in the HPI.  Objective: No changes noted in today's physical examination. There were no vitals filed for this visit.  Alison Huffman is a pleasant 82 y.o. female in NAD. AAO x 3.  Vascular Examination: Capillary refill time <3 seconds b/l LE. Palpable pedal pulses b/l LE. Digital hair present b/l. No pedal edema b/l. Skin temperature gradient WNL b/l. No varicosities b/l. [name]  Dermatological Examination: Pedal skin with normal turgor, texture and tone b/l. No open wounds. No interdigital macerations b/l.  Overgrown toenails 1-5 b/l.[name]  Neurological Examination: Protective sensation intact with 10 gram monofilament b/l LE. Vibratory sensation intact b/l LE.   Musculoskeletal Examination: Normal muscle strength 5/5 to all lower extremity muscle groups bilaterally. Pes cavus deformity noted b/l lower extremities.. No pain, crepitus or joint limitation noted with ROM b/l LE.  Patient ambulates independently without assistive aids.     Latest Ref Rng & Units 04/18/2023   11:34 AM 07/17/2022    8:58 AM  Hemoglobin A1C  Hemoglobin-A1c 4.6 - 6.5 % 6.2  6.0    Assessment/Plan: 1. Pes cavus    -Patient was evaluated and treated. All patient's and/or POA's questions/concerns answered on today's visit. -Orthotics placed in sneakers today with instructions to wear for one hour today, continuing to follow printed break-in instructions. Patient to note any areas of irritation or discomfort. Call office and schedule appt with Dr. Allena Katz or Pedorthist should she have any problems with new orthotics. She related  understanding -Toenails 1-5 b/l debrided as a courtesy. -Continue supportive shoe gear daily. -Patient/POA to call should there be question/concern in the interim.   Return in about 3 months (around 08/14/2023).  [name], DPM

## 2023-05-21 ENCOUNTER — Other Ambulatory Visit (INDEPENDENT_AMBULATORY_CARE_PROVIDER_SITE_OTHER): Payer: Medicare Other

## 2023-05-21 ENCOUNTER — Ambulatory Visit (INDEPENDENT_AMBULATORY_CARE_PROVIDER_SITE_OTHER): Payer: Medicare Other | Admitting: Neurology

## 2023-05-21 ENCOUNTER — Encounter: Payer: Self-pay | Admitting: Neurology

## 2023-05-21 VITALS — BP 145/69 | HR 67 | Ht 66.0 in | Wt 181.0 lb

## 2023-05-21 DIAGNOSIS — R413 Other amnesia: Secondary | ICD-10-CM

## 2023-05-21 DIAGNOSIS — G610 Guillain-Barre syndrome: Secondary | ICD-10-CM

## 2023-05-21 LAB — VITAMIN B12: Vitamin B-12: 373 pg/mL (ref 211–911)

## 2023-05-21 LAB — TSH: TSH: 1.39 u[IU]/mL (ref 0.35–5.50)

## 2023-05-21 NOTE — Progress Notes (Signed)
Follow-up Visit   Date: 05/21/23   Alison Huffman MRN: [id number] DOB: April 07, 1941    Alison Huffman is a 82 y.o. right-handed Caucasian female with cardiac arrythmia s/p pacemaker, psoratic arthritis, hypertension, OSA, GERD, and hyperlipidemia returning to the clinic for follow-up of Guillain Barre Syndrome.  The patient was accompanied to the clinic by self.   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  Treated with IVIG (no response) and then plasmapheresis which she has great response.  Clinically, she has residual distal paresthesias, which is unchanged. Fortunately, she does not have painful paresthesias.   Memory changes, age-related. She scored 25/30 on MOCA, with only missing 1 point for delayed recall.  Reassured patient that there was no signs of dementia.    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 MRI brain from 2023 did not show findings of raised ICP.  She is followed by ophthalmology.   Right vocal cord paralysis, secondary to intubation  Return to clinic in 1 year  --------------------------------------------------------------- History of present illness: She presented to [facility] in June and transferred to [facility] with ascending paresthesias, proximal weakness (3/5 hip flexion, 3/5 shoulder extension), dysphagia, and respiratory failure about a week following treatment with Enbrel. She had a complex hospital course with  hyponatremia-induced (Na 119) seizure, respiratory failure requiring resuscitation, bilateral pneumothorax, and right vocal cord paralysis.  Work-up included CSF testing which did not show albuminocytologic dissociation, but given high clinical suspicion was treated for Guillain Barre syndrome with IVIG but did not respond.  She responded to plasmapheresis x 7 session. MRI brain, cervical spine, and  thoracic spine was unremarkable.  She slowly improved and discharged to rehab then SNF, where she continued to make improvements.  She is now walking with a rollator/cane and able to walk a few steps unassisted.  No dysarthria, dysphagia, double vision, or limb weakness.  She still have numbness/tingling in the lower legs/feet. She is now walking with a rollator and able to take steps unassisted.  No falls.   UPDATE 12/03/2020:  She is here for follow-up visit.  She has NCS/EMG in December which was consistent with chronic demyelinating and axonal neuropathy.  She has been very compliant with PT/OT and made significant improvement.  She is no longer using a walker and able to walk unassisted at home, and uses a cone for long distances.  Numbness remains in the distal feet. No new complaints  UPDATE 05/18/2021:  She is here for 6 month visit.  She has been doing great from a neurological stand point.  She only is aware of numbness/tingling in the toes when she flexes her toes.   Most of the time, she is unaware. No new weakness or falls.  She walks unassisted. She tries to be compliant with her PT exercises.  She continues to have shortness of breath and will be seeing cardiology.  She received COVID booster in the Spring and tolerated it well.   UPDATE 05/19/2022: There has been no change to the numbness/tingling in her feet.  She had one fall in July with superficial injuries.  She had a headache after her fall, which has improved now.  No recent headache.  Last fall, she developed a "cloud" in the vision of her right and found to have optic edema involving the right eye.  She has a permanent blind spot.  She had not  had brain imaging.  She reports having this involving the left eye in the past when she was younger and saw neurophthalmology.   UPDATE 05/21/2023:  She is here for follow-up visit.  She continues to have numbness from the ankles into the feet.  Balance is fair.  No interval falls.  She uses a cane  as needed.  In November, she had COVID.  She has noticed word-finding difficulty and forgetting names.  She continues to perform alls ADLs and ADLs.    Medications:  Current Outpatient Medications on File Prior to Visit  Medication Sig Dispense Refill   acetaminophen (TYLENOL) 325 MG tablet Take 325-650 mg by mouth every 6 (six) hours as needed for mild pain or headache.      albuterol (VENTOLIN HFA) 108 (90 Base) MCG/ACT inhaler Inhale 2 puffs into the lungs every 6 (six) hours as needed for wheezing or shortness of breath. 8 g 2   Cholecalciferol 50 MCG (2000 UT) TABS Take 1 tablet by mouth daily.     clobetasol ointment (TEMOVATE) 0.05 %      furosemide (LASIX) 20 MG tablet TAKE ONE TABLET BY MOUTH EVERY DAY AS NEEDED FOR EDEMA 30 tablet 3   ketoconazole (NIZORAL) 2 % cream Apply 1 application topically daily. 15 g 0   losartan (COZAAR) 25 MG tablet Take 1 tablet (25 mg total) by mouth daily. 90 tablet 3   methylcellulose (CITRUCEL) oral powder      Naproxen Sodium (ALEVE PO) Take by mouth as needed.     polyethylene glycol (MIRALAX / GLYCOLAX) 17 g packet Take 17 g by mouth daily as needed for severe constipation. 14 each 0   rosuvastatin (CRESTOR) 20 MG tablet Take 1 tablet (20 mg total) by mouth daily. 90 tablet 3   tacrolimus (PROTOPIC) 0.1 % ointment Apply topically 2 (two) times daily as needed (for psoriasis).      Vitamin D, Ergocalciferol, 50 MCG (2000 UT) CAPS Take by mouth.     No current facility-administered medications on file prior to visit.    Allergies:  Allergies  Allergen Reactions   Etanercept     Guillain Baree   Nickel     Vital Signs:  BP (!) 145/69   Pulse 67   Ht 5\' 6"  (1.676 m)   Wt 181 lb (82.1 kg)   SpO2 96%   BMI 29.21 kg/m   Neurological Exam: MENTAL STATUS including orientation to time, place, person, recent and remote memory, attention span and concentration, language, and fund of knowledge is normal.  Speech is not dysarthric, mild  hoarseness.  CRANIAL NERVES:  Pupils equal round and reactive to light.  Normal conjugate, extra-ocular eye movements in all directions of gaze.  Mild left ptosis.  Face is symmetric.  MOTOR:  Motor strength is 5/5 in all extremities, including distally.  No pronator drift.  Tone is normal.    MSRs:                                           Right        Left brachioradialis 2+  2+  biceps 2+  2+  triceps 2+  2+  patellar 1+  1+  ankle jerk 0  0   SENSORY:  Reduced vibration at the ankles, worse on the left.   COORDINATION/GAIT:    Gait mildly wide-based stable, unassisted.  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  MRI brain wwo contrast 07/11/2022: No acute or reversible finding. Moderate chronic small-vessel ischemic changes of the pons and cerebral hemispheric white matter, slightly progressive since 2021. No imaging finding of increased intracranial pressure.   Lab Results  Component Value Date   TSH 1.22 09/12/2022   Lab Results  Component Value Date   VITAMINB12 323 03/27/2020     Thank you for allowing me to participate in patient's care.  If I can answer any additional questions, I would be pleased to do so.    Sincerely,    Pearley Baranek K. Allena Katz, DO

## 2023-05-21 NOTE — Patient Instructions (Signed)
Check labs  I will see you back in 1 year

## 2023-05-31 NOTE — Progress Notes (Signed)
Cardiology Office Note  Date:  06/01/2023   ID:  Alison, Huffman December 12, 1940, MRN [id number]  PCP:  Glori Luis, MD   Chief Complaint  Patient presents with   1 year follow up     Patient c/o shortness of breath with over exertion & bilateral LE edema at times. Medications reviewed by the patient verbally.     HPI:  [name] is a 82 year old woman with past medical history of Hyperlipidemia Hypertension Psoriatic arthritis on methotrexate, "better" OSA, on CPAP Heart block, pacer implanted October 2019 Who presents for chronic shortness of breath on exertion  Last seen by myself in clinic August 2022 Seen by EP January 2024  Followed by pulmonary  Lab work reviewed LDL 70, down from 166 on Crestor 20 daily A1C 6.2  Chronic knee pain, previous cortisone shots Sedentary, unable to ambulate very far  Denies significant chest pain on exertion No significant leg swelling  Echo 2022; normal EF  EKG personally reviewed by myself on todays visit EKG Interpretation Date/Time:  Friday June 01 2023 14:43:03 EDT Ventricular Rate:  70 PR Interval:  242 QRS Duration:  172 QT Interval:  462 QTC Calculation: 498 R Axis:   -78  Text Interpretation: Atrial-sensed ventricular-paced rhythm with prolonged AV conduction When compared with ECG of 11-Apr-2020 11:32, Electronic ventricular pacemaker has replaced Sinus rhythm Confirmed by Julien Nordmann [phone]) on 06/01/2023 2:49:51 PM   Other past medical history reviewed History of Guillain-Barr syndrome "On venilator 9 days" In er 03/2021, numbness In hospital 03/27/2021:   1 week after receiving her Enbrel shot patient developed numbness and tingling in her lower extremities. Transferred to [facility] for MRI  Required intubation for airway protection and transferred to ICU. 7/06 patient had code blue and sustained bilateral pneumothoraces after bag ventilation for which bilateral chest tubes were placed. Patient  started on PLEX 7/7 per neuro recommendation. Patient extubated on 7/11 and weaning HFNC. --went to rehab, to walk,  [place] 4 weeks  CT chest 11/2020 CAD, aortic athero  Followed by Dr. Lucious Groves, pulmonary Has sleep apnea On CPAP  Previous carotid ultrasound less than 39% disease on the left October 2018    PMH:   has a past medical history of Ankle pain, right, Atypical chest pain, Coronary artery disease, Diastolic dysfunction, GERD (gastroesophageal reflux disease), Guillain-Barre (HCC), Headache, Hyperlipidemia, Hypertension, Inflammatory neuropathy (HCC), Joint pain in fingers of right hand, Lichen sclerosus, Osteopenia, Pneumonia (2012? X 1), Presence of permanent cardiac pacemaker (07/23/2018), Psoriasis, Psoriatic arthritis (HCC), Psoriatic arthritis (HCC), Rosacea, Seborrheic keratosis, Second degree heart block, Sleep apnea, and TMJ (dislocation of temporomandibular joint).  PSH:    Past Surgical History:  Procedure Laterality Date   APPENDECTOMY     BREAST CYST ASPIRATION Bilateral    BREAST CYST EXCISION Right 1978   benign   COLONOSCOPY     COLONOSCOPY WITH PROPOFOL N/A 07/10/2019   Procedure: COLONOSCOPY WITH PROPOFOL;  Surgeon: Christena Deem, MD;  Location: [facility] ENDOSCOPY;  Service: Endoscopy;  Laterality: N/A;   ESOPHAGOGASTRODUODENOSCOPY (EGD) WITH PROPOFOL N/A 07/10/2019   Procedure: ESOPHAGOGASTRODUODENOSCOPY (EGD) WITH PROPOFOL;  Surgeon: Christena Deem, MD;  Location: [facility] ENDOSCOPY;  Service: Endoscopy;  Laterality: N/A;   HYSTEROSCOPY WITH D & C N/A 11/05/2018   Procedure: DILATATION AND CURETTAGE /HYSTEROSCOPY;  Surgeon: Natale Milch, MD;  Location: [facility] ORS;  Service: Gynecology;  Laterality: N/A;   INSERT / REPLACE / REMOVE PACEMAKER     PACEMAKER IMPLANT N/A 07/23/2018  SJM Assurity 2272 implanted by Dr Johney Frame for mobitz II second degree AV block   UTERINE POLYPS REMOVAL      Current Outpatient Medications  Medication Sig Dispense Refill    acetaminophen (TYLENOL) 325 MG tablet Take 325-650 mg by mouth every 6 (six) hours as needed for mild pain or headache.      albuterol (VENTOLIN HFA) 108 (90 Base) MCG/ACT inhaler Inhale 2 puffs into the lungs every 6 (six) hours as needed for wheezing or shortness of breath. 8 g 2   clobetasol ointment (TEMOVATE) 0.05 %      furosemide (LASIX) 20 MG tablet TAKE ONE TABLET BY MOUTH EVERY DAY AS NEEDED FOR EDEMA 30 tablet 3   ketoconazole (NIZORAL) 2 % cream Apply 1 application topically daily. 15 g 0   losartan (COZAAR) 25 MG tablet Take 1 tablet (25 mg total) by mouth daily. 90 tablet 3   methylcellulose (CITRUCEL) oral powder      Naproxen Sodium (ALEVE PO) Take by mouth as needed.     polyethylene glycol (MIRALAX / GLYCOLAX) 17 g packet Take 17 g by mouth daily as needed for severe constipation. 14 each 0   rosuvastatin (CRESTOR) 20 MG tablet Take 1 tablet (20 mg total) by mouth daily. 90 tablet 3   tacrolimus (PROTOPIC) 0.1 % ointment Apply topically 2 (two) times daily as needed (for psoriasis).      Vitamin D, Ergocalciferol, 50 MCG (2000 UT) CAPS Take by mouth.     No current facility-administered medications for this visit.     Allergies:   Etanercept and Nickel   Social History:  The patient  reports that she has never smoked. She has never used smokeless tobacco. She reports current alcohol use. She reports that she does not use drugs.   Family History:   family history includes Atrial fibrillation in her mother; Breast cancer in her maternal aunt, mother, and paternal aunt; Stroke in her father and mother.    Review of Systems: Review of Systems  Constitutional: Negative.   HENT: Negative.    Respiratory:  Positive for shortness of breath.   Cardiovascular:  Positive for leg swelling.  Gastrointestinal: Negative.   Musculoskeletal: Negative.   Neurological: Negative.   Psychiatric/Behavioral: Negative.    All other systems reviewed and are negative.   PHYSICAL  EXAM: VS:  BP 120/70 (BP Location: Left Arm, Patient Position: Sitting, Cuff Size: Normal)   Pulse 70   Ht 5\' 6"  (1.676 m)   Wt 181 lb 2 oz (82.2 kg)   SpO2 97%   BMI 29.23 kg/m  , BMI Body mass index is 29.23 kg/m. Constitutional:  oriented to person, place, and time. No distress.  HENT:  Head: Grossly normal Eyes:  no discharge. No scleral icterus.  Neck: No JVD, no carotid bruits  Cardiovascular: Regular rate and rhythm, no murmurs appreciated Pulmonary/Chest: Clear to auscultation bilaterally, no wheezes or rails Abdominal: Soft.  no distension.  no tenderness.  Musculoskeletal: Normal range of motion Neurological:  normal muscle tone. Coordination normal. No atrophy Skin: Skin warm and dry Psychiatric: normal affect, pleasant  Recent Labs: 09/12/2022: Hemoglobin 12.6; Platelets 227 04/18/2023: ALT 13; BUN 14; Creatinine, Ser 0.62; Potassium 4.6; Sodium 139 05/21/2023: TSH 1.39    Lipid Panel Lab Results  Component Value Date   CHOL 227 (H) 07/17/2022   HDL 53.20 07/17/2022   LDLCALC 157 (H) 07/17/2022   TRIG 88.0 07/17/2022      Wt Readings from Last 3 Encounters:  06/01/23 181 lb 2 oz (82.2 kg)  05/21/23 181 lb (82.1 kg)  04/18/23 180 lb 12.8 oz (82 kg)     ASSESSMENT AND PLAN:  Exertional dyspnea/angina Normal echo 2022 deconditioning, recommended walking program for conditioning Cardiac CTA with minimal coronary calcification Would benefit from lung Works program  Prediabetes Recommended low carbohydrate intake A1c reasonable  Bilateral leg edema Venous,  insignificant on today's visit Compression hose as needed  Mixed hyperlipidemia Continue Crestor, lipids much improved  Essential hypertension Blood pressure is well controlled on today's visit. No changes made to the medications.  OSA (obstructive sleep apnea) Followed by pulmonary On CPAP  High degree AV block Status post pacemaker   Total encounter time more than 30 minutes  Greater  than 50% was spent in counseling and coordination of care with the patient    Orders Placed This Encounter  Procedures   EKG 12-Lead     Signed, Dossie Arbour, M.D., Ph.D. 06/01/2023  [facility] [place], [place] [phone]

## 2023-06-01 ENCOUNTER — Ambulatory Visit: Payer: Medicare Other | Attending: Cardiovascular Disease | Admitting: Cardiovascular Disease

## 2023-06-01 ENCOUNTER — Encounter: Payer: Self-pay | Admitting: Cardiovascular Disease

## 2023-06-01 VITALS — BP 120/70 | HR 70 | Ht 66.0 in | Wt 181.1 lb

## 2023-06-01 DIAGNOSIS — I5032 Chronic diastolic (congestive) heart failure: Secondary | ICD-10-CM | POA: Insufficient documentation

## 2023-06-01 DIAGNOSIS — I2510 Atherosclerotic heart disease of native coronary artery without angina pectoris: Secondary | ICD-10-CM | POA: Insufficient documentation

## 2023-06-01 DIAGNOSIS — I10 Essential (primary) hypertension: Secondary | ICD-10-CM | POA: Diagnosis not present

## 2023-06-01 DIAGNOSIS — R0609 Other forms of dyspnea: Secondary | ICD-10-CM

## 2023-06-01 DIAGNOSIS — I700 Atherosclerosis of aorta: Secondary | ICD-10-CM | POA: Diagnosis not present

## 2023-06-01 DIAGNOSIS — J479 Bronchiectasis, uncomplicated: Secondary | ICD-10-CM | POA: Insufficient documentation

## 2023-06-01 DIAGNOSIS — I441 Atrioventricular block, second degree: Secondary | ICD-10-CM | POA: Diagnosis not present

## 2023-06-01 DIAGNOSIS — Z950 Presence of cardiac pacemaker: Secondary | ICD-10-CM | POA: Insufficient documentation

## 2023-06-01 DIAGNOSIS — E785 Hyperlipidemia, unspecified: Secondary | ICD-10-CM | POA: Diagnosis not present

## 2023-06-01 DIAGNOSIS — G4733 Obstructive sleep apnea (adult) (pediatric): Secondary | ICD-10-CM | POA: Diagnosis not present

## 2023-06-01 DIAGNOSIS — J984 Other disorders of lung: Secondary | ICD-10-CM | POA: Insufficient documentation

## 2023-06-01 NOTE — Patient Instructions (Signed)

## 2023-06-06 DIAGNOSIS — N83202 Unspecified ovarian cyst, left side: Secondary | ICD-10-CM | POA: Diagnosis not present

## 2023-06-06 DIAGNOSIS — N888 Other specified noninflammatory disorders of cervix uteri: Secondary | ICD-10-CM | POA: Diagnosis not present

## 2023-06-06 DIAGNOSIS — R9389 Abnormal findings on diagnostic imaging of other specified body structures: Secondary | ICD-10-CM | POA: Diagnosis not present

## 2023-06-15 ENCOUNTER — Encounter: Payer: Self-pay | Admitting: Pulmonary Disease

## 2023-06-15 ENCOUNTER — Ambulatory Visit (INDEPENDENT_AMBULATORY_CARE_PROVIDER_SITE_OTHER): Payer: Medicare Other | Admitting: Pulmonary Disease

## 2023-06-15 VITALS — BP 132/68 | HR 62 | Temp 97.6°F | Ht 66.0 in | Wt 183.0 lb

## 2023-06-15 DIAGNOSIS — J479 Bronchiectasis, uncomplicated: Secondary | ICD-10-CM

## 2023-06-15 DIAGNOSIS — G4733 Obstructive sleep apnea (adult) (pediatric): Secondary | ICD-10-CM

## 2023-06-15 DIAGNOSIS — U099 Post covid-19 condition, unspecified: Secondary | ICD-10-CM | POA: Diagnosis not present

## 2023-06-15 DIAGNOSIS — R0609 Other forms of dyspnea: Secondary | ICD-10-CM

## 2023-06-15 NOTE — Progress Notes (Signed)
[facility]  Chief Complaint  Patient presents with   Follow-up    Continues SOB with exertion. Wearing CPAP avg 8 hours nightly. Tolerating mask and pressure well.     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  Brief Summary:  Alison Huffman is a 82 y.o. female with obstructive sleep apnea, and bronchiectasis.      Subjective:   Uses CPAP nightly.  No issues with mask fit.  Feels rested.  Not having sinus congestion or dry mouth.  She gets winded when she walks fast.  Not having cough, wheeze, or sputum.  Hasn't needed to use albuterol recently.  Physical Exam:   Appearance - well kempt   ENMT - no sinus tenderness, no oral exudate, no LAN, Mallampati 3 airway, no stridor  Respiratory - equal breath sounds bilaterally, no wheezing or rales  CV - s1s2 regular rate and rhythm, no murmurs  Ext - no clubbing, no edema  Skin - no rashes  Psych - normal mood and affect      Pulmonary Tests:  PFT 12/26/22 >> FEV1 1.90 (94%), FEV1% 85, TLC 4.28 (85%), DLCO 71%, +BD  Chest Imaging:  CT chest  06/05/20 >> mild cylindrical BTX RLL, mild GGO Rt lung base CT chest 12/16/20 >> stable nodules, ATX CT chest 12/18/20 >> stable BTX RLL, faint GGO with subpleural reticulation in periphery RML and lower lobes, Rt base nodule now 4 mm (was 6 mm), stable RUL nodules 4 mm and 3 mm, calcified granulomas b/l, stable thin wall cyst LUL HRCT chest 06/19/22 >> cylindrical BTX, calcified granulomas, scattered nodules up to 5 mm, GGO resolved, air trapping HRCT chest 01/08/23 >> calcified granulomas, mild traction BTX, no change in nodules up to 4 mm, mild GGO in LUL, mild air trapping (mild sequela of COVID)  Sleep Tests:  PSG 01/24/18 >> AHI 24.1, SpO2 low 84.4%; CPAP 8 cm H2O Auto CPAP 05/15/23 to 06/13/23 >> used on 30 of 30 nights with average 8 hrs 1 min.  Average AHI 1.8 with median CPAP 8 and 95 th percentile CPAP 12 cm H2O  Cardiac Tests:  Echo 04/12/20 >> EF 55 to 60%, grade 1 DD, mod LA dilation  Social History:  She  reports that she has never smoked. She has never used smokeless tobacco. She reports current alcohol use. She reports that she does not use drugs.  Family History:  Her family history includes Atrial fibrillation in her mother; Breast cancer in her maternal aunt, mother, and paternal aunt; Stroke in her father and mother.     Assessment/Plan:   Obstructive sleep apnea. - she is compliant with CPAP and reports benefit from therapy -  gets supplies from [place] - she should be eligible for a new CPAP machine in 2025 - continue auto CPAP 5 to 15 cm H2O  Post-COVID dyspnea. - mild symptoms at present - prn albuterol  Right lower lobe bronchiectasis. - mild and likely post-infectious - bronchial hygiene as needed  Allergic rhinitis. - prn flonase - can add astepro as needed  History of Guillain Barre Syndrome. - followed by Dr. Nita Sickle with [facility]  Second degree heart block s/p pacemaker. - followed by Dr. Hillis Range with [facility]  Time Spent  Involved in Patient Care on Day of Examination:  26 minutes  Follow up:   Patient Instructions  Follow up in 1 year  Medication List:   Allergies as of 06/15/2023       Reactions   Etanercept    Guillain Baree   Nickel         Medication List        Accurate as of June 15, 2023  3:15 PM. If you have any questions, ask your nurse or doctor.          acetaminophen 325 MG tablet Commonly known as: TYLENOL Take 325-650 mg by mouth every 6 (six) hours as needed for mild pain or headache.   albuterol 108 (90 Base) MCG/ACT inhaler Commonly known as: VENTOLIN HFA Inhale 2 puffs into the lungs every 6 (six) hours as needed for wheezing or shortness of breath.   ALEVE PO Take by mouth as needed.   Citrucel oral powder Generic drug: methylcellulose   clobetasol ointment 0.05 % Commonly known as: TEMOVATE   furosemide 20 MG tablet Commonly known as: LASIX TAKE ONE TABLET BY MOUTH EVERY DAY AS NEEDED FOR EDEMA   ketoconazole 2 % cream Commonly known as: NIZORAL Apply 1 application topically daily.   losartan 25 MG tablet Commonly known as: COZAAR Take 1 tablet (25 mg total) by mouth daily.   polyethylene glycol 17 g packet Commonly known as: MIRALAX / GLYCOLAX Take 17 g by mouth daily as needed for severe constipation.   rosuvastatin 20 MG tablet Commonly known as: Crestor Take 1 tablet (20 mg total) by mouth daily.   tacrolimus 0.1 % ointment Commonly known as: PROTOPIC Apply topically 2 (two) times daily as needed (for psoriasis).   Vitamin D (Ergocalciferol) 50 MCG (2000 UT) Caps Take by mouth.        Signature:  Coralyn Helling, MD [facility] Pager - [phone] 06/15/2023, 3:15 PM

## 2023-06-15 NOTE — Patient Instructions (Signed)
Follow up in 1 year.

## 2023-06-19 ENCOUNTER — Other Ambulatory Visit: Payer: Self-pay | Admitting: Family Medicine

## 2023-06-19 DIAGNOSIS — Z1231 Encounter for screening mammogram for malignant neoplasm of breast: Secondary | ICD-10-CM

## 2023-07-02 ENCOUNTER — Ambulatory Visit
Admission: RE | Admit: 2023-07-02 | Discharge: 2023-07-02 | Disposition: A | Discharge status: Home / Self Care | Payer: Medicare Other | Source: Ambulatory Visit | Attending: Family Medicine | Admitting: Family Medicine

## 2023-07-02 DIAGNOSIS — Z1231 Encounter for screening mammogram for malignant neoplasm of breast: Secondary | ICD-10-CM | POA: Insufficient documentation

## 2023-07-03 ENCOUNTER — Other Ambulatory Visit: Payer: Self-pay | Admitting: Family Medicine

## 2023-07-03 DIAGNOSIS — R928 Other abnormal and inconclusive findings on diagnostic imaging of breast: Secondary | ICD-10-CM

## 2023-07-04 ENCOUNTER — Telehealth: Payer: Self-pay

## 2023-07-04 NOTE — Telephone Encounter (Signed)
-----   Message from Marikay Alar sent at 07/03/2023 10:47 AM EDT ----- Please let the patient know that her mammogram revealed a possible asymmetry in the left breast.  They recommended a diagnostic mammogram and ultrasound to evaluate further.  She should call [phone] to get this scheduled.

## 2023-07-04 NOTE — Telephone Encounter (Signed)
Left message to call the office back regarding the message below.

## 2023-07-05 NOTE — Telephone Encounter (Signed)
Patient states she is returning a call from Alison Huffman, CMA.  I read Dr. Bernardo Heater message to patient.  Patient states she has already been trying to schedule her diagnostic mammogram and ultrasound but has been unable to get through at the office.  Patient states she will continue to call them.

## 2023-07-05 NOTE — Telephone Encounter (Signed)
noted 

## 2023-07-06 DIAGNOSIS — H903 Sensorineural hearing loss, bilateral: Secondary | ICD-10-CM | POA: Diagnosis not present

## 2023-07-06 DIAGNOSIS — H6123 Impacted cerumen, bilateral: Secondary | ICD-10-CM | POA: Diagnosis not present

## 2023-07-06 DIAGNOSIS — R490 Dysphonia: Secondary | ICD-10-CM | POA: Diagnosis not present

## 2023-07-06 DIAGNOSIS — J3801 Paralysis of vocal cords and larynx, unilateral: Secondary | ICD-10-CM | POA: Diagnosis not present

## 2023-07-11 ENCOUNTER — Ambulatory Visit (INDEPENDENT_AMBULATORY_CARE_PROVIDER_SITE_OTHER): Payer: Medicare Other

## 2023-07-11 DIAGNOSIS — I441 Atrioventricular block, second degree: Secondary | ICD-10-CM | POA: Diagnosis not present

## 2023-07-11 LAB — CUP PACEART REMOTE DEVICE CHECK
Battery Remaining Longevity: 75 mo
Battery Remaining Percentage: 59 %
Battery Voltage: 2.99 V
Brady Statistic AP VP Percent: 7.9 %
Brady Statistic AP VS Percent: 1 %
Brady Statistic AS VP Percent: 79 %
Brady Statistic AS VS Percent: 11 %
Brady Statistic RA Percent Paced: 6.8 %
Brady Statistic RV Percent Paced: 87 %
Date Time Interrogation Session: 20241009020409
Implantable Lead Connection Status: 753985
Implantable Lead Connection Status: 753985
Implantable Lead Implant Date: 20191022
Implantable Lead Implant Date: 20191022
Implantable Lead Location: 753859
Implantable Lead Location: 753860
Implantable Pulse Generator Implant Date: 20191022
Lead Channel Impedance Value: 550 Ohm
Lead Channel Impedance Value: 640 Ohm
Lead Channel Pacing Threshold Amplitude: 0.5 V
Lead Channel Pacing Threshold Amplitude: 0.625 V
Lead Channel Pacing Threshold Pulse Width: 0.5 ms
Lead Channel Pacing Threshold Pulse Width: 0.5 ms
Lead Channel Sensing Intrinsic Amplitude: 12 mV
Lead Channel Sensing Intrinsic Amplitude: 3.7 mV
Lead Channel Setting Pacing Amplitude: 0.875
Lead Channel Setting Pacing Amplitude: 2 V
Lead Channel Setting Pacing Pulse Width: 0.5 ms
Lead Channel Setting Sensing Sensitivity: 2 mV
Pulse Gen Model: 2272
Pulse Gen Serial Number: 9073848

## 2023-07-13 ENCOUNTER — Ambulatory Visit
Admission: RE | Admit: 2023-07-13 | Discharge: 2023-07-13 | Disposition: A | Discharge status: Home / Self Care | Payer: Medicare Other | Source: Ambulatory Visit | Attending: Family Medicine | Admitting: Family Medicine

## 2023-07-13 DIAGNOSIS — R928 Other abnormal and inconclusive findings on diagnostic imaging of breast: Secondary | ICD-10-CM

## 2023-07-13 DIAGNOSIS — Z23 Encounter for immunization: Secondary | ICD-10-CM | POA: Diagnosis not present

## 2023-07-13 DIAGNOSIS — R92333 Mammographic heterogeneous density, bilateral breasts: Secondary | ICD-10-CM | POA: Diagnosis not present

## 2023-07-31 NOTE — Progress Notes (Signed)
Remote pacemaker transmission.   

## 2023-08-02 ENCOUNTER — Other Ambulatory Visit: Payer: Self-pay

## 2023-08-02 DIAGNOSIS — I10 Essential (primary) hypertension: Secondary | ICD-10-CM

## 2023-08-02 DIAGNOSIS — E785 Hyperlipidemia, unspecified: Secondary | ICD-10-CM

## 2023-08-02 MED ORDER — ROSUVASTATIN CALCIUM 20 MG PO TABS: 20.0000 mg | ORAL_TABLET | Freq: Every day | ORAL | 1 refills | Status: DC

## 2023-08-02 MED ORDER — LOSARTAN POTASSIUM 25 MG PO TABS: 25.0000 mg | ORAL_TABLET | Freq: Every day | ORAL | 1 refills | Status: DC

## 2023-08-02 NOTE — Telephone Encounter (Signed)
Received faxed refill request for crestor to be sent to [facility].  Per chart pt normally gets medications filled at [facility].  Called pt to confirm where she wanted refill sent to.  Pt reported that her insurance works better with [facility] and requested that Losartan also be sent.  [facility].

## 2023-08-06 ENCOUNTER — Other Ambulatory Visit: Payer: Self-pay

## 2023-08-06 ENCOUNTER — Telehealth: Payer: Self-pay | Admitting: Family Medicine

## 2023-08-06 DIAGNOSIS — I10 Essential (primary) hypertension: Secondary | ICD-10-CM

## 2023-08-06 MED ORDER — LOSARTAN POTASSIUM 25 MG PO TABS: 25.0000 mg | ORAL_TABLET | Freq: Every day | ORAL | 1 refills | Status: DC

## 2023-08-06 NOTE — Telephone Encounter (Signed)
Patient just called and said she is out of her prescription. The name is losartan (COZAAR) 25 MG tablet. The pharmacy she use is [facility] [address]  [place] [address]

## 2023-08-06 NOTE — Telephone Encounter (Signed)
Prescription sent in  

## 2023-08-20 ENCOUNTER — Ambulatory Visit (INDEPENDENT_AMBULATORY_CARE_PROVIDER_SITE_OTHER): Payer: Medicare Other | Admitting: Podiatry

## 2023-08-20 ENCOUNTER — Encounter: Payer: Self-pay | Admitting: Podiatry

## 2023-08-20 DIAGNOSIS — G610 Guillain-Barre syndrome: Secondary | ICD-10-CM | POA: Diagnosis not present

## 2023-08-20 DIAGNOSIS — L602 Onychogryphosis: Secondary | ICD-10-CM

## 2023-08-20 NOTE — Progress Notes (Signed)
  Subjective:  Patient ID: Alison Huffman, female    DOB: January 05, 1941,  MRN: [id number]  Alison Huffman presents to clinic today for at risk foot care. Patient has history of GBS with neuropathy and painful elongated overgrown toenails which are tender when wearing enclosed shoe gear.   Patient states orthotics are working well. They did squeak in her sneakers. She placed nylon stockings over the orthotic devices to alleviate the squeaking noise.  New problem(s): None.   PCP is Glori Luis, MD. Last visit was July, 2024.  Allergies  Allergen Reactions   Etanercept     Guillain Baree   Nickel    Review of Systems: Negative except as noted in the HPI.  Objective: No changes noted in today's physical examination. There were no vitals filed for this visit. [name] is a pleasant 82 y.o. female WD, WN in NAD. AAO x 3.  Vascular Examination: Capillary refill time <3 seconds b/l LE. Palpable pedal pulses b/l LE. Digital hair present b/l. No pedal edema b/l. Skin temperature gradient WNL b/l. No varicosities b/l.   Dermatological Examination: Pedal skin with normal turgor, texture and tone b/l. No open wounds. No interdigital macerations b/l. Overgrown toenails 1-5 b/l.Marland Huffman  Neurological Examination: Pt has subjective symptoms of neuropathy. Protective sensation intact with 10 gram monofilament b/l LE. Vibratory sensation intact b/l LE.   Musculoskeletal Examination: Normal muscle strength 5/5 to all lower extremity muscle groups bilaterally. Pes cavus deformity noted b/l lower extremities.. No pain, crepitus or joint limitation noted with ROM b/l LE.  Wearing [employer] with orthotics. Patient ambulates with cane assistance today.  Assessment/Plan: 1. Overgrown toenails   2. Guillain Barr syndrome [facility])     -Consent given for treatment as described below: -Examined patient. -Continue orthotics daily. -Patient to continue soft, supportive shoe gear  daily. -Nondystrophic toenails trimmed 1-5 bilaterally. -Patient/POA to call should there be question/concern in the interim.   Return in about 3 months (around 11/20/2023).  Alison Huffman, DPM      [place] LOCATION: [address], [place] [address]                   Office [phone]   [facility] LOCATION: [address] [place], [place] [address] Office [phone]

## 2023-09-05 DIAGNOSIS — L821 Other seborrheic keratosis: Secondary | ICD-10-CM | POA: Diagnosis not present

## 2023-09-05 DIAGNOSIS — D2262 Melanocytic nevi of left upper limb, including shoulder: Secondary | ICD-10-CM | POA: Diagnosis not present

## 2023-09-05 DIAGNOSIS — D2261 Melanocytic nevi of right upper limb, including shoulder: Secondary | ICD-10-CM | POA: Diagnosis not present

## 2023-09-05 DIAGNOSIS — D225 Melanocytic nevi of trunk: Secondary | ICD-10-CM | POA: Diagnosis not present

## 2023-09-05 DIAGNOSIS — L400 Psoriasis vulgaris: Secondary | ICD-10-CM | POA: Diagnosis not present

## 2023-09-05 DIAGNOSIS — L900 Lichen sclerosus et atrophicus: Secondary | ICD-10-CM | POA: Diagnosis not present

## 2023-10-08 ENCOUNTER — Ambulatory Visit: Payer: Medicare Other | Attending: Cardiology | Admitting: Cardiology

## 2023-10-08 ENCOUNTER — Encounter: Payer: Self-pay | Admitting: Cardiology

## 2023-10-08 VITALS — BP 134/64 | HR 68 | Ht 66.0 in | Wt 184.2 lb

## 2023-10-08 DIAGNOSIS — I441 Atrioventricular block, second degree: Secondary | ICD-10-CM | POA: Diagnosis not present

## 2023-10-08 DIAGNOSIS — I5032 Chronic diastolic (congestive) heart failure: Secondary | ICD-10-CM | POA: Insufficient documentation

## 2023-10-08 DIAGNOSIS — Z950 Presence of cardiac pacemaker: Secondary | ICD-10-CM | POA: Insufficient documentation

## 2023-10-08 LAB — CUP PACEART INCLINIC DEVICE CHECK
Battery Remaining Longevity: 73 mo
Battery Voltage: 2.99 V
Brady Statistic RA Percent Paced: 6.8 %
Brady Statistic RV Percent Paced: 89 %
Date Time Interrogation Session: 20250106155808
Implantable Lead Connection Status: 753985
Implantable Lead Connection Status: 753985
Implantable Lead Implant Date: 20191022
Implantable Lead Implant Date: 20191022
Implantable Lead Location: 753859
Implantable Lead Location: 753860
Implantable Pulse Generator Implant Date: 20191022
Lead Channel Impedance Value: 550 Ohm
Lead Channel Impedance Value: 637.5 Ohm
Lead Channel Pacing Threshold Amplitude: 0.5 V
Lead Channel Pacing Threshold Amplitude: 0.5 V
Lead Channel Pacing Threshold Amplitude: 0.75 V
Lead Channel Pacing Threshold Amplitude: 0.75 V
Lead Channel Pacing Threshold Pulse Width: 0.5 ms
Lead Channel Pacing Threshold Pulse Width: 0.5 ms
Lead Channel Pacing Threshold Pulse Width: 0.5 ms
Lead Channel Pacing Threshold Pulse Width: 0.5 ms
Lead Channel Sensing Intrinsic Amplitude: 11.2 mV
Lead Channel Sensing Intrinsic Amplitude: 4.8 mV
Lead Channel Setting Pacing Amplitude: 1 V
Lead Channel Setting Pacing Amplitude: 2 V
Lead Channel Setting Pacing Pulse Width: 0.5 ms
Lead Channel Setting Sensing Sensitivity: 2 mV
Pulse Gen Model: 2272
Pulse Gen Serial Number: 9073848

## 2023-10-08 NOTE — Progress Notes (Signed)
 Electrophysiology Clinic Note    Date:  10/08/2023  Patient ID:  Alison, Huffman 01-Jul-1941, MRN [id number] PCP:  [name], MD  Cardiologist:  None Electrophysiologist: [name], MD   Discussed the use of AI scribe software for clinical note transcription with the patient, who gave verbal consent to proceed.   Patient Profile    Chief Complaint: 1 year PPM follow-up  History of Present Illness: Alison Huffman is a 83 y.o. 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  She last saw Dr. Holts 10/2022 and was doing well from a PPM standpoint.   On follow-up today, she has rare palpitation episodes that occur sporadically, less than once per week.  She has ongoing SOB which is stable, began when she had Guillain-Barre several years ago. She is not SOB walking from waiting room to exam room. She takes lasix  PRN, less than once a month.  She has edema today, and if she did not have an appt, she would have taken her lasix . Planning to take tomorrow.    she denies chest pain, dyspnea, PND, orthopnea, nausea, vomiting, dizziness, syncope, edema, weight gain, or early satiety.     Device Information: ST. Jude dual chamber PPM, imp 07/2018; dx 2nd deg Mobitz   ROS:  Please see the history of present illness. All other systems are reviewed and otherwise negative.    Physical Exam    VS:  BP 134/64   Pulse 68   Ht 5' 6 (1.676 m)   Wt 184 lb 3.2 oz (83.6 kg)   SpO2 95%   BMI 29.73 kg/m  BMI: Body mass index is 29.73 kg/m.  Wt Readings from Last 3 Encounters:  10/08/23 184 lb 3.2 oz (83.6 kg)  06/15/23 183 lb (83 kg)  06/01/23 181 lb 2 oz (82.2 kg)     GEN- The patient is well appearing, alert and oriented x 3 today.   Lungs- Clear to ausculation bilaterally, normal work of breathing.  Heart- Regular rate  and rhythm, no murmurs, rubs or gallops Extremities- 1+ peripheral edema, warm, dry Skin-  device pocket well-healed, no tethering   Device interrogation done today and reviewed by myself:  Battery 5 years Lead thresholds, impedence, sensing stable  Dependent down to 40bpm Brief AT episodes, overall low burden High VP percentage No changes made today   Studies Reviewed   Previous EP, cardiology notes.    EKG is ordered. Personal review of EKG from today shows:    EKG Interpretation Date/Time:  Monday October 08 2023 14:19:57 EST Ventricular Rate:  68 PR Interval:  236 QRS Duration:  168 QT Interval:  474 QTC Calculation: 504 R Axis:   -79  Text Interpretation: Atrial-sensed ventricular-paced rhythm with prolonged AV conduction Confirmed by [name] [id number]) on 10/08/2023 2:57:18 PM    Coronary CTA, 06/02/2021 1. Coronary calcium  score of 10.9. This was 17th percentile for age and sex matched control.  2. Normal coronary origin with right dominance.  3. Minimal non obstructive mid LAD disease (<25%).  4. CAD-RADS 1. Minimal non-obstructive CAD (0-24%). Consider non-atherosclerotic causes of chest pain. Consider preventive therapy and risk factor modification.  TTE, 04/12/2020  1. Left ventricular ejection fraction, by estimation, is 55 to 60%. The left ventricle has normal function. The left ventricle has no regional wall motion abnormalities.  Left ventricular diastolic parameters are consistent with Grade I diastolic dysfunction (impaired relaxation).   2. Right ventricular systolic function is normal. The right ventricular size is normal.   3. Left atrial size was moderately dilated.   4. The mitral valve is normal in structure. Trivial mitral valve regurgitation. No evidence of mitral stenosis.   5. The aortic valve is tricuspid. Aortic valve regurgitation is not visualized. Mild aortic valve sclerosis is present, with no evidence of aortic valve stenosis.   6. The  inferior vena cava is normal in size with greater than 50% respiratory variability, suggesting right atrial pressure of 3 mmHg.      Assessment and Plan     #) 2nd degree AV block, Mobitz type 2 #) PPM in situ Device functioning well, see paceart for details High VP percentage with RV lead and wide QRS Will update TTE to confirm preserved LVEF  #) HFpEF #) lower extremity edema #) SOB, ILD SOB thought to be d/t post-covid, follows regularly with Dr. Shellia at [facility] pulm Takes lasix  PRN, recommended she take a dose tomorrow for lower extremity edema Update TTE as above     Current medicines are reviewed at length with the patient today.   The patient does not have concerns regarding her medicines.  The following changes were made today:  none  Labs/ tests ordered today include:  Orders Placed This Encounter  Procedures   EKG 12-Lead   ECHOCARDIOGRAM COMPLETE     Disposition: Follow up with Dr. Cindie or EP APP in 12 months, sooner if TTE is abnormal   Signed, [name], NP  10/08/23  3:41 PM  Electrophysiology [facility]

## 2023-10-08 NOTE — Patient Instructions (Signed)
 Medication Instructions:   Your physician recommends that you continue on your current medications as directed. Please refer to the Current Medication list given to you today.  *If you need a refill on your cardiac medications before your next appointment, please call your pharmacy*   Lab Work:  None Ordered  If you have labs (blood work) drawn today and your tests are completely normal, you will receive your results only by: MyChart Message (if you have MyChart) OR A paper copy in the mail If you have any lab test that is abnormal or we need to change your treatment, we will call you to review the results.   Testing/Procedures:  Your physician has requested that you have an echocardiogram. Echocardiography is a painless test that uses sound waves to create images of your heart. It provides your doctor with information about the size and shape of your heart and how well your heart's chambers and valves are working. This procedure takes approximately one hour. There are no restrictions for this procedure. Please do NOT wear cologne, perfume, aftershave, or lotions (deodorant is allowed). Please arrive 15 minutes prior to your appointment time.  Please note: We ask at that you not bring children with you during ultrasound (echo/ vascular) testing. Due to room size and safety concerns, children are not allowed in the ultrasound rooms during exams. Our front office staff cannot provide observation of children in our lobby area while testing is being conducted. An adult accompanying a patient to their appointment will only be allowed in the ultrasound room at the discretion of the ultrasound technician under special circumstances. We apologize for any inconvenience.   Follow-Up: At [facility], you and your health needs are our priority.  As part of our continuing mission to provide you with exceptional heart care, we have created designated Provider Care Teams.  These Care Teams  include your primary Cardiologist (physician) and Advanced Practice Providers (APPs -  Physician Assistants and Nurse Practitioners) who all work together to provide you with the care you need, when you need it.  We recommend signing up for the patient portal called MyChart.  Sign up information is provided on this After Visit Summary.  MyChart is used to connect with patients for Virtual Visits (Telemedicine).  Patients are able to view lab/test results, encounter notes, upcoming appointments, etc.  Non-urgent messages can be sent to your provider as well.   To learn more about what you can do with MyChart, go to [email or web address].    Your next appointment:   1 year(s)  Provider:   Suzann Riddle, NP

## 2023-10-09 ENCOUNTER — Other Ambulatory Visit: Payer: Self-pay | Admitting: Cardiology

## 2023-10-09 DIAGNOSIS — I441 Atrioventricular block, second degree: Secondary | ICD-10-CM

## 2023-10-09 DIAGNOSIS — Z950 Presence of cardiac pacemaker: Secondary | ICD-10-CM

## 2023-10-09 DIAGNOSIS — I5032 Chronic diastolic (congestive) heart failure: Secondary | ICD-10-CM

## 2023-10-10 ENCOUNTER — Ambulatory Visit (INDEPENDENT_AMBULATORY_CARE_PROVIDER_SITE_OTHER): Payer: Medicare Other

## 2023-10-10 DIAGNOSIS — I441 Atrioventricular block, second degree: Secondary | ICD-10-CM

## 2023-10-10 LAB — CUP PACEART REMOTE DEVICE CHECK
Battery Remaining Longevity: 70 mo
Battery Remaining Percentage: 56 %
Battery Voltage: 2.99 V
Brady Statistic AP VP Percent: 3.2 %
Brady Statistic AP VS Percent: 1 %
Brady Statistic AS VP Percent: 94 %
Brady Statistic AS VS Percent: 1.9 %
Brady Statistic RA Percent Paced: 1.9 %
Brady Statistic RV Percent Paced: 97 %
Date Time Interrogation Session: 20250108020013
Implantable Lead Connection Status: 753985
Implantable Lead Connection Status: 753985
Implantable Lead Implant Date: 20191022
Implantable Lead Implant Date: 20191022
Implantable Lead Location: 753859
Implantable Lead Location: 753860
Implantable Pulse Generator Implant Date: 20191022
Lead Channel Impedance Value: 510 Ohm
Lead Channel Impedance Value: 640 Ohm
Lead Channel Pacing Threshold Amplitude: 0.5 V
Lead Channel Pacing Threshold Amplitude: 0.75 V
Lead Channel Pacing Threshold Pulse Width: 0.5 ms
Lead Channel Pacing Threshold Pulse Width: 0.5 ms
Lead Channel Sensing Intrinsic Amplitude: 11.2 mV
Lead Channel Sensing Intrinsic Amplitude: 3 mV
Lead Channel Setting Pacing Amplitude: 1 V
Lead Channel Setting Pacing Amplitude: 2 V
Lead Channel Setting Pacing Pulse Width: 0.5 ms
Lead Channel Setting Sensing Sensitivity: 2 mV
Pulse Gen Model: 2272
Pulse Gen Serial Number: 9073848

## 2023-10-19 ENCOUNTER — Encounter: Payer: Self-pay | Admitting: Family Medicine

## 2023-10-19 ENCOUNTER — Ambulatory Visit (INDEPENDENT_AMBULATORY_CARE_PROVIDER_SITE_OTHER): Payer: Medicare Other | Admitting: Family Medicine

## 2023-10-19 VITALS — BP 130/74 | HR 72 | Ht 66.0 in | Wt 179.0 lb

## 2023-10-19 DIAGNOSIS — R7303 Prediabetes: Secondary | ICD-10-CM

## 2023-10-19 DIAGNOSIS — I5032 Chronic diastolic (congestive) heart failure: Secondary | ICD-10-CM | POA: Diagnosis not present

## 2023-10-19 DIAGNOSIS — H04201 Unspecified epiphora, right lacrimal gland: Secondary | ICD-10-CM | POA: Diagnosis not present

## 2023-10-19 DIAGNOSIS — G610 Guillain-Barre syndrome: Secondary | ICD-10-CM

## 2023-10-19 DIAGNOSIS — M1712 Unilateral primary osteoarthritis, left knee: Secondary | ICD-10-CM | POA: Diagnosis not present

## 2023-10-19 DIAGNOSIS — H04209 Unspecified epiphora, unspecified lacrimal gland: Secondary | ICD-10-CM | POA: Insufficient documentation

## 2023-10-19 DIAGNOSIS — I10 Essential (primary) hypertension: Secondary | ICD-10-CM | POA: Diagnosis not present

## 2023-10-19 DIAGNOSIS — E782 Mixed hyperlipidemia: Secondary | ICD-10-CM | POA: Diagnosis not present

## 2023-10-19 DIAGNOSIS — M179 Osteoarthritis of knee, unspecified: Secondary | ICD-10-CM | POA: Insufficient documentation

## 2023-10-19 DIAGNOSIS — J849 Interstitial pulmonary disease, unspecified: Secondary | ICD-10-CM | POA: Diagnosis not present

## 2023-10-19 LAB — COMPREHENSIVE METABOLIC PANEL
ALT: 17 U/L (ref 0–35)
AST: 18 U/L (ref 0–37)
Albumin: 4.2 g/dL (ref 3.5–5.2)
Alkaline Phosphatase: 90 U/L (ref 39–117)
BUN: 15 mg/dL (ref 6–23)
CO2: 29 meq/L (ref 19–32)
Calcium: 9.3 mg/dL (ref 8.4–10.5)
Chloride: 104 meq/L (ref 96–112)
Creatinine, Ser: 0.57 mg/dL (ref 0.40–1.20)
GFR: 83.72 mL/min (ref >60.00)
Glucose, Bld: 92 mg/dL (ref 70–99)
Potassium: 4.3 meq/L (ref 3.5–5.1)
Sodium: 140 meq/L (ref 135–145)
Total Bilirubin: 0.7 mg/dL (ref 0.2–1.2)
Total Protein: 7.3 g/dL (ref 6.0–8.3)

## 2023-10-19 LAB — LIPID PANEL
Cholesterol: 159 mg/dL (ref 0–200)
HDL: 60.1 mg/dL (ref >39.00)
LDL Cholesterol: 87 mg/dL (ref 0–99)
NonHDL: 99.05
Total CHOL/HDL Ratio: 3
Triglycerides: 62 mg/dL (ref 0.0–149.0)
VLDL: 12.4 mg/dL (ref 0.0–40.0)

## 2023-10-19 LAB — HEMOGLOBIN A1C: Hgb A1c MFr Bld: 6.3 % (ref 4.6–6.5)

## 2023-10-19 NOTE — Assessment & Plan Note (Signed)
Chronic issue.  Patient will be referred to [facility] orthopedics.

## 2023-10-19 NOTE — Assessment & Plan Note (Signed)
Chronic issue.  Generally stable.  She can continue Lasix 20 mg daily as needed for swelling or shortness of breath.

## 2023-10-19 NOTE — Assessment & Plan Note (Signed)
Chronic issue.  Patient will contact ophthalmology for follow-up.

## 2023-10-19 NOTE — Assessment & Plan Note (Signed)
Chronic issue.  Stable.  She will continue to see neurology.

## 2023-10-19 NOTE — Assessment & Plan Note (Signed)
Chronic issue.  Patient notes her pulmonologist has moved practices.  She plans to try to follow them.  She will continue to see pulmonology for this.

## 2023-10-19 NOTE — Progress Notes (Signed)
Marikay Alar, MD Phone: [phone]  Alison Huffman is a 83 y.o. female who presents today for f/u.  HYPERTENSION/CHF Disease Monitoring Home BP Monitoring similar Chest pain- no    Dyspnea- chronic and stable Medications Compliance-  taking losartan, lasix prn.  Edema- occasionally, resolved with lasix most recently Patient know she has an echo scheduled in the near future. BMET    Component Value Date/Time   NA 139 04/18/2023 1134   NA 142 09/12/2022 0000   K 4.6 04/18/2023 1134   CL 104 04/18/2023 1134   CO2 28 04/18/2023 1134   GLUCOSE 78 04/18/2023 1134   BUN 14 04/18/2023 1134   BUN 13 05/23/2021 1210   CREATININE 0.62 04/18/2023 1134   CALCIUM 10.1 04/18/2023 1134   GFRNONAA >60 12/22/2021 1256   GFRAA >60 05/03/2020 0513   Guillain-Barr: Patient notes occasional paresthesias that are not bothersome.  Describes it as tingling.  She continues to follow with neurology.  Left knee arthritis: Patient saw orthopedics previously had 2 injections which were beneficial.  Notes her knees been bothering her a little more recently.  She wonders about seeing a different orthopedist to get their input.  Right eye tearing: Patient has seen ophthalmology for this.  They recommended over-the-counter eyedrops.  Notes it has helped some though she does continue to have tearing and occasionally has some swelling in her eyelid  Social History   Tobacco Use  Smoking Status Never  Smokeless Tobacco Never  Tobacco Comments   07/22/2018 "smoked some in college; 1960s;  nothing since"    Current Outpatient Medications on File Prior to Visit  Medication Sig Dispense Refill   acetaminophen (TYLENOL) 325 MG tablet Take 325-650 mg by mouth every 6 (six) hours as needed for mild pain or headache.      albuterol (VENTOLIN HFA) 108 (90 Base) MCG/ACT inhaler Inhale 2 puffs into the lungs every 6 (six) hours as needed for wheezing or shortness of breath. 8 g 2   clobetasol ointment  (TEMOVATE) 0.05 %      furosemide (LASIX) 20 MG tablet TAKE ONE TABLET BY MOUTH EVERY DAY AS NEEDED FOR EDEMA 30 tablet 3   ketoconazole (NIZORAL) 2 % cream Apply 1 application topically daily. 15 g 0   losartan (COZAAR) 25 MG tablet Take 1 tablet (25 mg total) by mouth daily. 90 tablet 1   methylcellulose (CITRUCEL) oral powder      Naproxen Sodium (ALEVE PO) Take by mouth as needed.     polyethylene glycol (MIRALAX / GLYCOLAX) 17 g packet Take 17 g by mouth daily as needed for severe constipation. 14 each 0   rosuvastatin (CRESTOR) 20 MG tablet Take 1 tablet (20 mg total) by mouth daily. 90 tablet 1   tacrolimus (PROTOPIC) 0.1 % ointment Apply topically 2 (two) times daily as needed (for psoriasis).      Vitamin D, Ergocalciferol, 50 MCG (2000 UT) CAPS Take by mouth.     No current facility-administered medications on file prior to visit.     ROS see history of present illness  Objective  Physical Exam Vitals:   10/19/23 0857  BP: 130/74  Pulse: 72  SpO2: 96%    BP Readings from Last 3 Encounters:  10/19/23 130/74  10/08/23 134/64  06/15/23 132/68   Wt Readings from Last 3 Encounters:  10/19/23 179 lb (81.2 kg)  10/08/23 184 lb 3.2 oz (83.6 kg)  06/15/23 183 lb (83 kg)    Physical Exam Constitutional:  General: She is not in acute distress.    Appearance: She is not diaphoretic.  Eyes:     Comments: Right eye tearing  Cardiovascular:     Rate and Rhythm: Normal rate and regular rhythm.     Heart sounds: Normal heart sounds.  Pulmonary:     Effort: Pulmonary effort is normal.     Breath sounds: Normal breath sounds.  Skin:    General: Skin is warm and dry.  Neurological:     Mental Status: She is alert.      Assessment/Plan: Please see individual problem list.  Chronic diastolic (congestive) heart failure (HCC) Assessment & Plan: Chronic issue.  Generally stable.  She can continue Lasix 20 mg daily as needed for swelling or shortness of  breath.   Primary hypertension Assessment & Plan: Chronic issue.  Adequately controlled.  Patient will continue losartan 25 mg daily.  Orders: -     Comprehensive metabolic panel -     Lipid panel  ILD (interstitial lung disease) (HCC) Assessment & Plan: Chronic issue.  Patient notes her pulmonologist has moved practices.  She plans to try to follow them.  She will continue to see pulmonology for this.   Guillain Barr syndrome [facility]) Assessment & Plan: Chronic issue.  Stable.  She will continue to see neurology.   Primary osteoarthritis of left knee Assessment & Plan: Chronic issue.  Patient will be referred to [facility] orthopedics.  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  Patient will contact ophthalmology for follow-up.   Prediabetes -     Hemoglobin A1c  Mixed hyperlipidemia -     Comprehensive metabolic panel -     Lipid panel    Return for as scheduled.   Marikay Alar, MD [facility]

## 2023-10-19 NOTE — Assessment & Plan Note (Signed)
Chronic issue.  Adequately controlled. Patient will continue losartan 25 mg daily.

## 2023-10-24 ENCOUNTER — Ambulatory Visit: Payer: Medicare Other | Attending: Cardiology

## 2023-10-24 DIAGNOSIS — I5032 Chronic diastolic (congestive) heart failure: Secondary | ICD-10-CM | POA: Insufficient documentation

## 2023-10-24 DIAGNOSIS — I441 Atrioventricular block, second degree: Secondary | ICD-10-CM | POA: Diagnosis not present

## 2023-10-24 LAB — ECHOCARDIOGRAM COMPLETE
AV Mean grad: 5 mm[Hg]
AV Peak grad: 8.8 mm[Hg]
Ao pk vel: 1.48 m/s
Area-P 1/2: 3.17 cm2
P 1/2 time: 593 ms
S' Lateral: 3.7 cm

## 2023-10-25 DIAGNOSIS — H0100A Unspecified blepharitis right eye, upper and lower eyelids: Secondary | ICD-10-CM | POA: Diagnosis not present

## 2023-10-31 DIAGNOSIS — M1712 Unilateral primary osteoarthritis, left knee: Secondary | ICD-10-CM | POA: Diagnosis not present

## 2023-11-07 DIAGNOSIS — M1712 Unilateral primary osteoarthritis, left knee: Secondary | ICD-10-CM | POA: Diagnosis not present

## 2023-11-07 DIAGNOSIS — M25562 Pain in left knee: Secondary | ICD-10-CM | POA: Diagnosis not present

## 2023-11-07 DIAGNOSIS — R2681 Unsteadiness on feet: Secondary | ICD-10-CM | POA: Diagnosis not present

## 2023-11-09 DIAGNOSIS — R2681 Unsteadiness on feet: Secondary | ICD-10-CM | POA: Diagnosis not present

## 2023-11-09 DIAGNOSIS — M1712 Unilateral primary osteoarthritis, left knee: Secondary | ICD-10-CM | POA: Diagnosis not present

## 2023-11-09 DIAGNOSIS — M25562 Pain in left knee: Secondary | ICD-10-CM | POA: Diagnosis not present

## 2023-11-12 DIAGNOSIS — M25562 Pain in left knee: Secondary | ICD-10-CM | POA: Diagnosis not present

## 2023-11-12 DIAGNOSIS — M1712 Unilateral primary osteoarthritis, left knee: Secondary | ICD-10-CM | POA: Diagnosis not present

## 2023-11-12 DIAGNOSIS — R2681 Unsteadiness on feet: Secondary | ICD-10-CM | POA: Diagnosis not present

## 2023-11-14 DIAGNOSIS — M1712 Unilateral primary osteoarthritis, left knee: Secondary | ICD-10-CM | POA: Diagnosis not present

## 2023-11-14 DIAGNOSIS — R2681 Unsteadiness on feet: Secondary | ICD-10-CM | POA: Diagnosis not present

## 2023-11-14 DIAGNOSIS — M25562 Pain in left knee: Secondary | ICD-10-CM | POA: Diagnosis not present

## 2023-11-19 ENCOUNTER — Ambulatory Visit: Payer: Medicare Other | Admitting: Podiatry

## 2023-11-19 DIAGNOSIS — M1712 Unilateral primary osteoarthritis, left knee: Secondary | ICD-10-CM | POA: Diagnosis not present

## 2023-11-19 DIAGNOSIS — R2681 Unsteadiness on feet: Secondary | ICD-10-CM | POA: Diagnosis not present

## 2023-11-19 DIAGNOSIS — M25562 Pain in left knee: Secondary | ICD-10-CM | POA: Diagnosis not present

## 2023-11-21 DIAGNOSIS — M1712 Unilateral primary osteoarthritis, left knee: Secondary | ICD-10-CM | POA: Diagnosis not present

## 2023-11-21 DIAGNOSIS — R2681 Unsteadiness on feet: Secondary | ICD-10-CM | POA: Diagnosis not present

## 2023-11-21 DIAGNOSIS — M25562 Pain in left knee: Secondary | ICD-10-CM | POA: Diagnosis not present

## 2023-11-21 NOTE — Progress Notes (Signed)
 Remote pacemaker transmission.

## 2023-11-22 ENCOUNTER — Ambulatory Visit: Payer: Medicare Other | Admitting: Podiatry

## 2023-11-23 ENCOUNTER — Ambulatory Visit: Payer: Medicare Other | Admitting: Podiatry

## 2023-11-26 DIAGNOSIS — M25562 Pain in left knee: Secondary | ICD-10-CM | POA: Diagnosis not present

## 2023-11-26 DIAGNOSIS — R2681 Unsteadiness on feet: Secondary | ICD-10-CM | POA: Diagnosis not present

## 2023-11-26 DIAGNOSIS — M1712 Unilateral primary osteoarthritis, left knee: Secondary | ICD-10-CM | POA: Diagnosis not present

## 2023-11-28 ENCOUNTER — Encounter: Payer: Medicare Other | Admitting: Family Medicine

## 2023-11-28 DIAGNOSIS — M1712 Unilateral primary osteoarthritis, left knee: Secondary | ICD-10-CM | POA: Diagnosis not present

## 2023-11-28 DIAGNOSIS — R2681 Unsteadiness on feet: Secondary | ICD-10-CM | POA: Diagnosis not present

## 2023-11-28 DIAGNOSIS — M25562 Pain in left knee: Secondary | ICD-10-CM | POA: Diagnosis not present

## 2023-11-29 ENCOUNTER — Ambulatory Visit (INDEPENDENT_AMBULATORY_CARE_PROVIDER_SITE_OTHER): Payer: Medicare Other | Admitting: Nurse Practitioner

## 2023-11-29 ENCOUNTER — Encounter: Payer: Self-pay | Admitting: Nurse Practitioner

## 2023-11-29 VITALS — BP 120/80 | HR 60 | Temp 97.6°F | Ht 66.0 in | Wt 182.2 lb

## 2023-11-29 DIAGNOSIS — I10 Essential (primary) hypertension: Secondary | ICD-10-CM

## 2023-11-29 DIAGNOSIS — E782 Mixed hyperlipidemia: Secondary | ICD-10-CM | POA: Diagnosis not present

## 2023-11-29 DIAGNOSIS — R0609 Other forms of dyspnea: Secondary | ICD-10-CM | POA: Diagnosis not present

## 2023-11-29 DIAGNOSIS — I5032 Chronic diastolic (congestive) heart failure: Secondary | ICD-10-CM

## 2023-11-29 DIAGNOSIS — M1712 Unilateral primary osteoarthritis, left knee: Secondary | ICD-10-CM | POA: Diagnosis not present

## 2023-11-29 DIAGNOSIS — I441 Atrioventricular block, second degree: Secondary | ICD-10-CM | POA: Diagnosis not present

## 2023-11-29 NOTE — Assessment & Plan Note (Signed)
 Chronic shortness of breath persists, worsened by exertion, with no improvement despite pulmonology management. The inhaler is not used regularly. Use the inhaler before exertion to potentially alleviate symptoms. Schedule a follow-up with pulmonology.

## 2023-11-29 NOTE — Assessment & Plan Note (Addendum)
 Chronic. Stable. Using Lasix 20 mg as needed for swelling. Continue.  Follow up with Cardiology.

## 2023-11-29 NOTE — Assessment & Plan Note (Addendum)
 Chronic. Blood pressure is well controlled on Losartan 25 mg daily. Continue Losartan.

## 2023-11-29 NOTE — Assessment & Plan Note (Signed)
 Bone on bone changes are present. Physical therapy is ongoing, and a brace is used as needed. A steroid injection was administered on October 31, 2023. Knee replacement surgery is under consideration. Continue physical therapy and brace use as needed. Follow up with orthopedics in 3 weeks to discuss potential knee replacement surgery.

## 2023-11-29 NOTE — Progress Notes (Signed)
 Alison Dicker, NP-C Phone: [phone]  Alison Huffman is a 83 y.o. female who presents today for transfer of care.   Discussed the use of AI scribe software for clinical note transcription with the patient, who gave verbal consent to proceed.  History of Present Illness   Alison Huffman "Alison Huffman" is an 83 year old female with osteoarthritis and shortness of breath who presents for transfer of care and management of her conditions.  She has 'bone on bone' osteoarthritis in her knee. She received a steroid injection on October 31, 2023, and uses a knee brace as needed. Physical therapy started two weeks ago. She is contemplating knee replacement surgery.  She experiences shortness of breath, particularly when walking 200-300 feet. This symptom has been consistent but occurs more frequently now. She does not use her inhaler regularly. Her last pulmonology visit was in September 2024, and her pulmonologist has since left the practice. She is planning to schedule with a new provider at the practice.   She monitors her blood pressure at home, which is typically low, with diastolic readings of 50-60. She experiences intermittent swelling in her legs, more on the left than the right. She takes Lasix as needed and is on losartan. She take crestor for hyperlipidemia and denies any abdominal pains or muscle aches.  She has a recent dental issue with a broken tooth and is scheduled for a consult in [facility] for a dental implant. She anticipates needing medical clearance for the procedure.      Social History   Tobacco Use  Smoking Status Never  Smokeless Tobacco Never  Tobacco Comments   07/22/2018 "smoked some in college; 1960s;  nothing since"    Current Outpatient Medications on File Prior to Visit  Medication Sig Dispense Refill   acetaminophen (TYLENOL) 325 MG tablet Take 325-650 mg by mouth every 6 (six) hours as needed for mild pain or headache.      albuterol (VENTOLIN HFA) 108 (90 Base)  MCG/ACT inhaler Inhale 2 puffs into the lungs every 6 (six) hours as needed for wheezing or shortness of breath. 8 g 2   clobetasol ointment (TEMOVATE) 0.05 %      furosemide (LASIX) 20 MG tablet TAKE ONE TABLET BY MOUTH EVERY DAY AS NEEDED FOR EDEMA 30 tablet 3   ketoconazole (NIZORAL) 2 % cream Apply 1 application topically daily. 15 g 0   losartan (COZAAR) 25 MG tablet Take 1 tablet (25 mg total) by mouth daily. 90 tablet 1   methylcellulose (CITRUCEL) oral powder      Naproxen Sodium (ALEVE PO) Take by mouth as needed.     polyethylene glycol (MIRALAX / GLYCOLAX) 17 g packet Take 17 g by mouth daily as needed for severe constipation. 14 each 0   rosuvastatin (CRESTOR) 20 MG tablet Take 1 tablet (20 mg total) by mouth daily. 90 tablet 1   tacrolimus (PROTOPIC) 0.1 % ointment Apply topically 2 (two) times daily as needed (for psoriasis).      Vitamin D, Ergocalciferol, 50 MCG (2000 UT) CAPS Take by mouth.     No current facility-administered medications on file prior to visit.     ROS see history of present illness  Objective  Physical Exam Vitals:   11/29/23 0856  BP: 120/80  Pulse: 60  Temp: 97.6 F (36.4 C)  SpO2: 96%    BP Readings from Last 3 Encounters:  11/29/23 120/80  10/19/23 130/74  10/08/23 134/64   Wt Readings from Last 3 Encounters:  11/29/23 182 lb 3.2 oz (82.6 kg)  10/19/23 179 lb (81.2 kg)  10/08/23 184 lb 3.2 oz (83.6 kg)    Physical Exam Constitutional:      General: She is not in acute distress.    Appearance: Normal appearance.  HENT:     Head: Normocephalic.  Cardiovascular:     Rate and Rhythm: Normal rate and regular rhythm.     Heart sounds: Normal heart sounds.  Pulmonary:     Effort: Pulmonary effort is normal.     Breath sounds: Normal breath sounds.  Skin:    General: Skin is warm and dry.  Neurological:     General: No focal deficit present.     Mental Status: She is alert.  Psychiatric:        Mood and Affect: Mood normal.         Behavior: Behavior normal.      Assessment/Plan: Please see individual problem list.  Primary osteoarthritis of left knee Assessment & Plan: Bone on bone changes are present. Physical therapy is ongoing, and a brace is used as needed. A steroid injection was administered on October 31, 2023. Knee replacement surgery is under consideration. Continue physical therapy and brace use as needed. Follow up with orthopedics in 3 weeks to discuss potential knee replacement surgery.   Dyspnea on exertion Assessment & Plan: Chronic shortness of breath persists, worsened by exertion, with no improvement despite pulmonology management. The inhaler is not used regularly. Use the inhaler before exertion to potentially alleviate symptoms. Schedule a follow-up with pulmonology.   Primary hypertension Assessment & Plan: Chronic. Blood pressure is well controlled on Losartan 25 mg daily. Continue Losartan.    Mixed hyperlipidemia Assessment & Plan: Chronic. Currently on Crestor 20 mg daily with no abdominal pains or muscle aches. Recent LDL- 87. Continue Crestor.   Chronic diastolic (congestive) heart failure (HCC) Assessment & Plan: Chronic. Stable. Using Lasix 20 mg as needed for swelling. Continue.  Follow up with Cardiology.    Second degree AV block, Mobitz type II Assessment & Plan: Chronic. Had recent Echo in January to monitor. Pacemaker in place. No follow up appointment is schedule with Cardiology. Encouraged to schedule.     Return in about 6 months (around 05/28/2024) for Follow up.   Alison Dicker, NP-C [facility]

## 2023-11-29 NOTE — Assessment & Plan Note (Signed)
 Chronic. Had recent Echo in January to monitor. Pacemaker in place. No follow up appointment is schedule with Cardiology. Encouraged to schedule.

## 2023-11-29 NOTE — Assessment & Plan Note (Signed)
 Chronic. Currently on Crestor 20 mg daily with no abdominal pains or muscle aches. Recent LDL- 87. Continue Crestor.

## 2023-12-03 DIAGNOSIS — R2681 Unsteadiness on feet: Secondary | ICD-10-CM | POA: Diagnosis not present

## 2023-12-03 DIAGNOSIS — M1712 Unilateral primary osteoarthritis, left knee: Secondary | ICD-10-CM | POA: Diagnosis not present

## 2023-12-03 DIAGNOSIS — M25562 Pain in left knee: Secondary | ICD-10-CM | POA: Diagnosis not present

## 2023-12-05 HISTORY — PX: TOOTH EXTRACTION: SUR596

## 2023-12-07 DIAGNOSIS — R2681 Unsteadiness on feet: Secondary | ICD-10-CM | POA: Diagnosis not present

## 2023-12-07 DIAGNOSIS — M25562 Pain in left knee: Secondary | ICD-10-CM | POA: Diagnosis not present

## 2023-12-07 DIAGNOSIS — M1712 Unilateral primary osteoarthritis, left knee: Secondary | ICD-10-CM | POA: Diagnosis not present

## 2023-12-10 DIAGNOSIS — M25562 Pain in left knee: Secondary | ICD-10-CM | POA: Diagnosis not present

## 2023-12-10 DIAGNOSIS — R2681 Unsteadiness on feet: Secondary | ICD-10-CM | POA: Diagnosis not present

## 2023-12-10 DIAGNOSIS — M1712 Unilateral primary osteoarthritis, left knee: Secondary | ICD-10-CM | POA: Diagnosis not present

## 2023-12-12 DIAGNOSIS — M1712 Unilateral primary osteoarthritis, left knee: Secondary | ICD-10-CM | POA: Diagnosis not present

## 2023-12-12 DIAGNOSIS — R2681 Unsteadiness on feet: Secondary | ICD-10-CM | POA: Diagnosis not present

## 2023-12-12 DIAGNOSIS — M25562 Pain in left knee: Secondary | ICD-10-CM | POA: Diagnosis not present

## 2023-12-14 ENCOUNTER — Encounter: Payer: Self-pay | Admitting: Podiatry

## 2023-12-14 ENCOUNTER — Ambulatory Visit (INDEPENDENT_AMBULATORY_CARE_PROVIDER_SITE_OTHER): Payer: Medicare Other | Admitting: Podiatry

## 2023-12-14 DIAGNOSIS — L602 Onychogryphosis: Secondary | ICD-10-CM | POA: Diagnosis not present

## 2023-12-14 DIAGNOSIS — G610 Guillain-Barre syndrome: Secondary | ICD-10-CM

## 2023-12-17 DIAGNOSIS — R2681 Unsteadiness on feet: Secondary | ICD-10-CM | POA: Diagnosis not present

## 2023-12-17 DIAGNOSIS — M25562 Pain in left knee: Secondary | ICD-10-CM | POA: Diagnosis not present

## 2023-12-17 DIAGNOSIS — M1712 Unilateral primary osteoarthritis, left knee: Secondary | ICD-10-CM | POA: Diagnosis not present

## 2023-12-19 ENCOUNTER — Ambulatory Visit (INDEPENDENT_AMBULATORY_CARE_PROVIDER_SITE_OTHER): Payer: Medicare Other | Admitting: *Deleted

## 2023-12-19 VITALS — Ht 66.0 in | Wt 178.0 lb

## 2023-12-19 DIAGNOSIS — Z0000 Encounter for general adult medical examination without abnormal findings: Secondary | ICD-10-CM | POA: Diagnosis not present

## 2023-12-19 DIAGNOSIS — Z780 Asymptomatic menopausal state: Secondary | ICD-10-CM

## 2023-12-19 NOTE — Patient Instructions (Signed)
 [name] , Thank you for taking time to come for your Medicare Wellness Visit. I appreciate your ongoing commitment to your health goals. Please review the following plan we discussed and let me know if I can assist you in the future.   Referrals/Orders/Follow-Ups/Clinician Recommendations: Bone Density has been ordered. You have an order for:  []   2D Mammogram  []   3D Mammogram  [x]   Bone Density     Please call for appointment:  [facility]  [address] [place] [address] [phone]  Make sure to wear two-piece clothing.  No lotions, powders, or deodorants the day of the appointment. Make sure to bring picture ID and insurance card.  Bring list of medications you are currently taking including any supplements.    This is a list of the screening recommended for you and due dates:  Health Maintenance  Topic Date Due   COVID-19 Vaccine (8 - 2024-25 season) 06/03/2023   Mammogram  07/01/2024   Medicare Annual Wellness Visit  12/18/2024   DTaP/Tdap/Td vaccine (2 - Td or Tdap) 09/05/2027   Pneumonia Vaccine  Completed   Flu Shot  Completed   DEXA scan (bone density measurement)  Completed   Zoster (Shingles) Vaccine  Completed   HPV Vaccine  Aged Out    Advanced directives: (Copy Requested) Please bring a copy of your health care power of attorney and living will to the office to be added to your chart at your convenience. You can mail to [facility] [address] [place], [place] [address] or email to [email or web address]  Next Medicare Annual Wellness Visit scheduled for next year: Yes 12/22/24 @ 8:10

## 2023-12-19 NOTE — Progress Notes (Signed)
 Subjective:   Alison Huffman is a 83 y.o. who presents for a Medicare Wellness preventive visit.  Visit Complete: Virtual I connected with  Alison Huffman on 12/19/23 by a audio enabled telemedicine application and verified that I am speaking with the correct person using two identifiers.  Patient Location: Home  Provider Location: Office/Clinic  I discussed the limitations of evaluation and management by telemedicine. The patient expressed understanding and agreed to proceed.  Vital Signs: Because this visit was a virtual/telehealth visit, some criteria may be missing or patient reported. Any vitals not documented were not able to be obtained and vitals that have been documented are patient reported.  VideoDeclined- This patient declined Librarian, academic. Therefore the visit was completed with audio only.  Persons Participating in Visit: Patient.  AWV Questionnaire: Yes: Patient Medicare AWV questionnaire was completed by the patient on 12/15/23; I have confirmed that all information answered by patient is correct and no changes since this date.  Cardiac Risk Factors include: advanced age (>58men, >75 women);dyslipidemia;hypertension     Objective:    Today's Vitals   12/19/23 0812  Weight: 178 lb (80.7 kg)  Height: 5\' 6"  (1.676 m)   Body mass index is 28.73 kg/m.     12/19/2023    8:25 AM 05/21/2023    9:29 AM 05/19/2022   10:52 AM 11/28/2021    1:00 PM 11/09/2021   10:49 AM 07/01/2021    1:50 PM 05/18/2021    9:48 AM  Advanced Directives  Does Patient Have a Medical Advance Directive? Yes Yes Yes No Yes Yes Yes  Type of Estate agent of [place];Living will Healthcare Power of [place];Out of facility DNR (pink MOST or yellow form);Living will Healthcare Power of [place];Living will;Out of facility DNR (pink MOST or yellow form)  Healthcare Power of [place];Living will  Healthcare Power of [place];Out of facility DNR  (pink MOST or yellow form);Living will  Does patient want to make changes to medical advance directive?     No - Patient declined No - Patient declined   Copy of Healthcare Power of Attorney in Chart? No - copy requested    Yes - validated most recent copy scanned in chart (See row information)      Current Medications (verified) Outpatient Encounter Medications as of 12/19/2023  Medication Sig   acetaminophen (TYLENOL) 325 MG tablet Take 325-650 mg by mouth every 6 (six) hours as needed for mild pain or headache.    albuterol (VENTOLIN HFA) 108 (90 Base) MCG/ACT inhaler Inhale 2 puffs into the lungs every 6 (six) hours as needed for wheezing or shortness of breath.   clobetasol ointment (TEMOVATE) 0.05 %    furosemide (LASIX) 20 MG tablet TAKE ONE TABLET BY MOUTH EVERY DAY AS NEEDED FOR EDEMA   ketoconazole (NIZORAL) 2 % cream Apply 1 application topically daily.   losartan (COZAAR) 25 MG tablet Take 1 tablet (25 mg total) by mouth daily.   methylcellulose (CITRUCEL) oral powder    Naproxen Sodium (ALEVE PO) Take by mouth as needed.   polyethylene glycol (MIRALAX / GLYCOLAX) 17 g packet Take 17 g by mouth daily as needed for severe constipation.   rosuvastatin (CRESTOR) 20 MG tablet Take 1 tablet (20 mg total) by mouth daily.   tacrolimus (PROTOPIC) 0.1 % ointment Apply topically 2 (two) times daily as needed (for psoriasis).    Vitamin D, Ergocalciferol, 50 MCG (2000 UT) CAPS Take by mouth.   No facility-administered encounter  medications on file as of 12/19/2023.    Allergies (verified) Etanercept and Nickel   History: Past Medical History:  Diagnosis Date   Ankle pain, right    Atypical chest pain    a. 10/2018 MV: EF 57%. No ischemia/infarct.   Coronary artery disease    Diastolic dysfunction    a. 04/2018 Echo: EF 55-60%, no rwma, Gr1 DD. Triv AI, mild MR. Mod dil LA. Nl RV fxn. PASP nl.   GERD (gastroesophageal reflux disease)    Guillain-Barre (HCC)    Headache    "dull one  sometimes daily, at least weekly in last couple months" (07/22/2018)   Hyperlipidemia    Hypertension    Inflammatory neuropathy (HCC)    Joint pain in fingers of right hand    Lichen sclerosus    Osteopenia    Pneumonia 2012? 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 07/2018 s/p SJM Assurity MRI model [id number] (Ser# [id number]).   Sleep apnea    "using nasal pilllows since ~ 12/2017" (07/22/2018)   TMJ (dislocation of temporomandibular joint)    Past Surgical History:  Procedure Laterality Date   APPENDECTOMY     BREAST CYST ASPIRATION Bilateral    BREAST CYST EXCISION Right 1978   benign   COLONOSCOPY     COLONOSCOPY WITH PROPOFOL N/A 07/10/2019   Procedure: COLONOSCOPY WITH PROPOFOL;  Surgeon: Christena Deem, MD;  [facility] END[facility]COPY;  Service: Endoscopy;  Laterality: N/A;   ESOPHAGOGASTRODUODENOSCOPY (EGD) WITH PROPOFOL N/A 07/10/2019   Procedure: ESOPHAGOGASTRODUODENOSCOPY (EGD) WITH PROPOFOL;  Surgeon: Christena Deem, MD;  Location: [facility] ENDOSCOPY;  Service: Endoscopy;  Laterality: N/A;   HYSTEROSCOPY WITH D & C N/A 11/05/2018   Procedure: DILATATION AND CURETTAGE /HYSTEROSCOPY;  Surgeon: Natale Milch, MD;  Location: [facility] ORS;  Service: Gynecology;  Laterality: N/A;   INSERT / REPLACE / REMOVE PACEMAKER     PACEMAKER IMPLANT N/A 07/23/2018   SJM Assurity 2272 implanted by Dr Johney Frame for mobitz II second degree AV block   TOOTH EXTRACTION  12/05/2023   UTERINE POLYPS REMOVAL     Family History  Problem Relation Age of Onset   Stroke Mother    Atrial fibrillation Mother    Breast cancer Mother    Stroke Father    Breast cancer Maternal Aunt    Breast cancer Paternal Aunt    Social History   Socioeconomic History   Marital status: Single     Spouse name: Not on file   Number of children: Not on file   Years of education: Not on file   Highest education level: Bachelor's degree (e.g., BA, AB, BS)  Occupational History   Not on file  Tobacco Use   Smoking status: Never   Smokeless tobacco: Never   Tobacco comments:    07/22/2018 "smoked some in college; 1960s;  nothing since"  Vaping Use   Vaping status: Never Used  Substance and Sexual Activity   Alcohol use: Yes    Comment: rarely   Drug use: Never   Sexual activity: Not Currently    Birth control/protection: Post-menopausal  Other Topics Concern   Not on file  Social History Narrative   Right Handed   Lives in  an apartment. 5th floor but has elevator    Drinks Caffeine.    Social Drivers of Corporate investment banker Strain: Low Risk  (12/19/2023)   Overall Financial Resource Strain (CARDIA)    Difficulty of Paying Living Expenses: Not hard at all  Food Insecurity: No Food Insecurity (12/19/2023)   Hunger Vital Sign    Worried About Running Out of Food in the Last Year: Never true    Ran Out of Food in the Last Year: Never true  Transportation Needs: No Transportation Needs (12/19/2023)   PRAPARE - Administrator, Civil Service (Medical): No    Lack of Transportation (Non-Medical): No  Physical Activity: Inactive (12/19/2023)   Exercise Vital Sign    Days of Exercise per Week: 0 days    Minutes of Exercise per Session: 0 min  Stress: No Stress Concern Present (12/19/2023)   [employer] of Occupational Health - Occupational Stress Questionnaire    Feeling of Stress : Not at all  Social Connections: Moderately Integrated (12/19/2023)   Social Connection and Isolation Panel [NHANES]    Frequency of Communication with Friends and Family: More than three times a week    Frequency of Social Gatherings with Friends and Family: More than three times a week    Attends Religious Services: More than 4 times per year    Active Member of [employer] or  Organizations: Yes    Attends Banker Meetings: More than 4 times per year    Marital Status: Never married    Tobacco Counseling Counseling given: Not Answered Tobacco comments: 07/22/2018 "smoked some in college; 1960s;  nothing since"    Clinical Intake:  Pre-visit preparation completed: Yes  Pain : No/denies pain     BMI - recorded: 28.73 Nutritional Status: BMI 25 -29 Overweight Nutritional Risks: None Diabetes: No  Lab Results  Component Value Date   HGBA1C 6.3 10/19/2023   HGBA1C 6.2 04/18/2023   HGBA1C 6.0 07/17/2022     How often do you need to have someone help you when you read instructions, pamphlets, or other written materials from your doctor or pharmacy?: 1 - Never  Interpreter Needed?: No  Information entered by :: R. Vishaal Strollo LPN   Activities of Daily Living     12/15/2023    8:47 AM  In your present state of health, do you have any difficulty performing the following activities:  Hearing? 1  Comment wears aids  Vision? 0  Difficulty concentrating or making decisions? 0  Walking or climbing stairs? 1  Dressing or bathing? 0  Doing errands, shopping? 0  Preparing Food and eating ? N  Using the Toilet? N  In the past six months, have you accidently leaked urine? Y  Do you have problems with loss of bowel control? N  Managing your Medications? N  Managing your Finances? N  Housekeeping or managing your Housekeeping? N    Patient Care Team: Bethanie Dicker, NP as PCP - General (Nurse Practitioner) Lanier Prude, MD as PCP - Electrophysiology (Cardiology) Glendale Chard, DO as Consulting Physician (Neurology) Antonieta Iba, MD as Consulting Physician (Cardiology) Antonieta Iba, MD as Consulting Physician (Cardiology)  Indicate any recent Medical Services you may have received from other than [facility] providers in the past year (date may be approximate).     Assessment:   This is a routine wellness examination for  [place].  Hearing/Vision screen Hearing Screening - Comments:: Wears aids Vision Screening - Comments::  glasses   Goals Addressed             This Visit's Progress    Patient Stated       Wants to lose weight       Depression Screen     12/19/2023    8:18 AM 11/29/2023    8:59 AM 10/19/2023    8:55 AM 04/18/2023   11:20 AM 01/16/2023    8:18 AM 11/13/2022   12:15 PM 07/17/2022    8:29 AM  PHQ 2/9 Scores  PHQ - 2 Score 2 0 0 0 0 0 0  PHQ- 9 Score 5 3 0 2 1 2      Fall Risk     12/15/2023    8:47 AM 11/29/2023    8:59 AM 10/19/2023    8:55 AM 05/21/2023    9:29 AM 04/18/2023   11:19 AM  Fall Risk   Falls in the past year? 0 0 0 0 0  Number falls in past yr: 0 0 0 0 0  Injury with Fall? 0 0 0 0 0  Risk for fall due to : No Fall Risks No Fall Risks No Fall Risks  No Fall Risks  Follow up Falls prevention discussed;Falls evaluation completed Falls evaluation completed Falls evaluation completed;Falls prevention discussed Falls evaluation completed Falls evaluation completed    MEDICARE RISK AT HOME:  Medicare Risk at Home Any stairs in or around the home?: Yes If so, are there any without handrails?: No Home free of loose throw rugs in walkways, pet beds, electrical cords, etc?: (Patient-Rptd) Yes Adequate lighting in your home to reduce risk of falls?: (Patient-Rptd) Yes Life alert?: (Patient-Rptd) Yes Use of a cane, walker or w/c?: (Patient-Rptd) Yes Grab bars in the bathroom?: (Patient-Rptd) Yes Shower chair or bench in shower?: (Patient-Rptd) Yes Elevated toilet seat or a handicapped toilet?: (Patient-Rptd) Yes  TIMED UP AND GO:  Was the test performed?  No  Cognitive Function: 6CIT completed    05/03/2018    8:54 AM  MMSE - Mini Mental State Exam  Orientation to time 5  Orientation to Place 5  Registration 3  Attention/ Calculation 5  Recall 3  Language- name 2 objects 2  Language- repeat 1  Language- follow 3 step command 3  Language- read & follow  direction 1  Write a sentence 1  Copy design 1  Total score 30      05/21/2023   10:00 AM  Montreal Cognitive Assessment   Visuospatial/ Executive (0/5) 4  Naming (0/3) 2  Attention: Read list of digits (0/2) 2  Attention: Read list of letters (0/1) 0  Attention: Serial 7 subtraction starting at 100 (0/3) 2  Language: Repeat phrase (0/2) 2  Language : Fluency (0/1) 1  Abstraction (0/2) 2  Delayed Recall (0/5) 4  Orientation (0/6) 6  Total 25  Adjusted Score (based on education) 25      12/19/2023    8:25 AM 09/21/2020    9:18 AM 09/19/2019    9:28 AM  6CIT Screen  What Year? 0 points 0 points 0 points  What month? 0 points 0 points 0 points  What time? 0 points 0 points 0 points  Count back from 20 0 points 0 points 0 points  Months in reverse  0 points 0 points  Repeat phrase  0 points 0 points  Total Score  0 points 0 points    Immunizations Immunization History  Administered Date(s) Administered   Fluad Quad(high  Dose 65+) 06/18/2019, 10/06/2020, 07/12/2022   Hepatitis B 01/01/1990, 01/31/1990, 07/03/1990   Influenza Whole 07/07/1999   Influenza, High Dose Seasonal PF 07/13/2021, 07/12/2022   Influenza-Unspecified 06/18/2017, 07/10/2018, 07/17/2023   PFIZER Comirnaty(Gray Top)Covid-19 Tri-Sucrose Vaccine 02/16/2021   PFIZER(Purple Top)SARS-COV-2 Vaccination 10/09/2019, 11/05/2019, 09/09/2020, 02/16/2021   Pneumococcal Conjugate-13 05/06/2014   Pneumococcal Polysaccharide-23 05/10/2005   Rsv, Bivalent, Protein Subunit Rsvpref,pf Verdis Frederickson) 08/16/2023   Tdap 09/04/2017   Unspecified SARS-COV-2 Vaccination 10/09/2019, 11/05/2019   Zoster Recombinant(Shingrix) 08/26/2019, 12/15/2019   Zoster, Live 07/28/2008, 08/11/2008    Screening Tests Health Maintenance  Topic Date Due   COVID-19 Vaccine (8 - 2024-25 season) 06/03/2023   Medicare Annual Wellness (AWV)  11/14/2023   DTaP/Tdap/Td (2 - Td or Tdap) 09/05/2027   Pneumonia Vaccine 84+ Years old  Completed    INFLUENZA VACCINE  Completed   DEXA SCAN  Completed   Zoster Vaccines- Shingrix  Completed   HPV VACCINES  Aged Out    Health Maintenance  Health Maintenance Due  Topic Date Due   COVID-19 Vaccine (8 - 2024-25 season) 06/03/2023   Medicare Annual Wellness (AWV)  11/14/2023   Health Maintenance Items Addressed:  DEXA ordered, Discussed covid vaccine.  Additional Screening:  Vision Screening: Recommended annual ophthalmology exams for early detection of glaucoma and other disorders of the eye. Up to date [facility]  Dental Screening: Recommended annual dental exams for proper oral hygiene  Community Resource Referral / Chronic Care Management: CRR required this visit?  No   CCM required this visit?  No     Plan:     I have personally reviewed and noted the following in the patient's chart:   Medical and social history Use of alcohol, tobacco or illicit drugs  Current medications and supplements including opioid prescriptions. Patient is not currently taking opioid prescriptions. Functional ability and status Nutritional status Physical activity Advanced directives List of other physicians Hospitalizations, surgeries, and ER visits in previous 12 months Vitals Screenings to include cognitive, depression, and falls Referrals and appointments  In addition, I have reviewed and discussed with patient certain preventive protocols, quality metrics, and best practice recommendations. A written personalized care plan for preventive services as well as general preventive health recommendations were provided to patient.     Sydell Axon, LPN   1/61/0960   After Visit Summary: (MyChart) Due to this being a telephonic visit, the after visit summary with patients personalized plan was offered to patient via MyChart   Notes: Nothing significant to report at this time.

## 2023-12-21 DIAGNOSIS — M1712 Unilateral primary osteoarthritis, left knee: Secondary | ICD-10-CM | POA: Diagnosis not present

## 2023-12-21 DIAGNOSIS — R2681 Unsteadiness on feet: Secondary | ICD-10-CM | POA: Diagnosis not present

## 2023-12-21 DIAGNOSIS — M25562 Pain in left knee: Secondary | ICD-10-CM | POA: Diagnosis not present

## 2023-12-22 ENCOUNTER — Encounter: Payer: Self-pay | Admitting: Podiatry

## 2023-12-22 NOTE — Progress Notes (Signed)
  Subjective:  Patient ID: Alison Huffman, female    DOB: 11-03-1940,  MRN: [id number]  Alison Huffman presents to clinic today for at risk foot care. Patient has history of GBS with peripheral neuropathy.   Chief Complaint  Patient presents with   Nail Problem    "Toenails, I have Neuropathy in my feet."   New problem(s): None.   PCP is Bethanie Dicker, NP.  Allergies  Allergen Reactions   Etanercept     Guillain Baree   Nickel     Review of Systems: Negative except as noted in the HPI.  Objective: No changes noted in today's physical examination. There were no vitals filed for this visit. Alison Huffman is a pleasant 83 y.o. female in NAD. AAO x 3.  Vascular Examination: Capillary refill time <3 seconds b/l LE. Palpable pedal pulses b/l LE. Digital hair present b/l. No pedal edema b/l. Skin temperature gradient WNL b/l. No varicosities b/l.   Dermatological Examination: Pedal skin with normal turgor, texture and tone b/l. No open wounds. No interdigital macerations b/l. Overgrown toenails 1-5 b/l.[name]  Neurological Examination: Pt has subjective symptoms of neuropathy. Protective sensation intact with 10 gram monofilament b/l LE. Vibratory sensation intact b/l LE.   Musculoskeletal Examination: Normal muscle strength 5/5 to all lower extremity muscle groups bilaterally. Pes cavus deformity noted b/l lower extremities.. No pain, crepitus or joint limitation noted with ROM b/l LE.  Patient ambulates with cane assistance today.  Assessment/Plan: 1. Overgrown toenails   2. Guillain Barr syndrome [facility])     -Patient was evaluated today. All questions/concerns addressed on today's visit. -Patient to continue soft, supportive shoe gear daily. -Nondystrophic toenails trimmed 1-5 left foot and 1-5 right foot. -Patient/POA to call should there be question/concern in the interim.   Return in about 3 months (around 03/15/2024).  [name], DPM      [facility]  LOCATION: [address] [place] [address]                   Office [phone]   [facility] LOCATION: [address] [place], [place] [address] Office [phone]

## 2023-12-24 DIAGNOSIS — M1712 Unilateral primary osteoarthritis, left knee: Secondary | ICD-10-CM | POA: Diagnosis not present

## 2023-12-24 DIAGNOSIS — M25562 Pain in left knee: Secondary | ICD-10-CM | POA: Diagnosis not present

## 2023-12-24 DIAGNOSIS — R2681 Unsteadiness on feet: Secondary | ICD-10-CM | POA: Diagnosis not present

## 2023-12-25 ENCOUNTER — Ambulatory Visit: Admitting: Pulmonary Disease

## 2023-12-25 ENCOUNTER — Encounter: Payer: Self-pay | Admitting: Pulmonary Disease

## 2023-12-25 VITALS — BP 128/82 | HR 70 | Temp 96.9°F | Ht 66.0 in | Wt 181.8 lb

## 2023-12-25 DIAGNOSIS — R0602 Shortness of breath: Secondary | ICD-10-CM

## 2023-12-25 MED ORDER — BUDESONIDE-FORMOTEROL FUMARATE 160-4.5 MCG/ACT IN AERO: 2.0000 | INHALATION_SPRAY | Freq: Two times a day (BID) | RESPIRATORY_TRACT | 12 refills | Status: AC

## 2023-12-25 MED ORDER — AEROCHAMBER MV MISC
0 refills | Status: AC
Start: 2023-12-25 — End: ?

## 2023-12-26 DIAGNOSIS — R2681 Unsteadiness on feet: Secondary | ICD-10-CM | POA: Diagnosis not present

## 2023-12-26 DIAGNOSIS — M25562 Pain in left knee: Secondary | ICD-10-CM | POA: Diagnosis not present

## 2023-12-26 DIAGNOSIS — M1712 Unilateral primary osteoarthritis, left knee: Secondary | ICD-10-CM | POA: Diagnosis not present

## 2023-12-26 NOTE — Progress Notes (Addendum)
 Synopsis: Referred in by Bluford Burkitt, NP   Subjective:   PATIENT ID: Alison Huffman GENDER: female DOB: 10-12-1940, MRN: [id number]  Chief Complaint  Patient presents with   Follow-up    Increased DOE. No wheezing or cough.  CPAP is good.     HPI [name] is a pleasant 83 year old female patient with a past medical history of psoriatic arthritis, OSA on CPAP, right lower lobe bronchiectasis, second-degree heart block status post pacemaker, and a 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  She was last seen by Dr. Matilde Son in 2024 and was managed for obstructive sleep apnea and was given the diagnosis of post-COVID dyspnea.  PFTs in March 2024 showed a borderline FVC, normal FEV1, increased FEV1 to FVC ratio.  Significant response to bronchodilator with FVC.  Normal lung volumes.  And normal diffusion capacity.  High-res CT in April 2024 with no significant interstitial lung disease but did show sequela of COVID-19 pneumonia.  ROS All systems were reviewed and are negative except for the above. Objective:   Vitals:   12/25/23 1311  BP: 128/82  Pulse: 70  Temp: (!) 96.9 F (36.1 C)  SpO2: 96%  Weight: 181 lb 12.8 oz (82.5 kg)  Height: 5\' 6"  (1.676 m)   96% on RA BMI Readings from Last 3 Encounters:  12/25/23 29.34 kg/m  12/19/23 28.73 kg/m  11/29/23 29.41 kg/m   Wt Readings from Last 3 Encounters:  12/25/23 181 lb 12.8 oz (82.5 kg)  12/19/23 178 lb (80.7 kg)  11/29/23 182 lb 3.2 oz (82.6 kg)    Physical Exam GEN: NAD, Healthy Appearing HEENT: Supple Neck, Reactive Pupils, EOMI  CVS: Normal S1, Normal S2, RRR, No murmurs or ES appreciated  Lungs: Clear bilateral air entry.  Abdomen: Soft, non tender, non distended, + BS  Extremities: Warm and well perfused, No edema  Skin: No  suspicious lesions appreciated  Psych: Normal Affect  Ancillary Information   CBC    Component Value Date/Time   WBC 5.4 09/12/2022 0000   WBC 5.4 12/22/2021 1256   RBC 4.07 09/12/2022 0000   HGB 12.6 09/12/2022 0000   HCT 37 09/12/2022 0000   PLT 227 09/12/2022 0000   MCV 94.5 12/22/2021 1256   MCH 30.4 12/22/2021 1256   MCHC 32.1 12/22/2021 1256   RDW 12.7 12/22/2021 1256   LYMPHSABS 1.7 12/22/2021 1256   MONOABS 0.4 12/22/2021 1256   EOSABS 0.1 12/22/2021 1256   BASOSABS 0.0 12/22/2021 1256   Labs and imaging were reviewed.    Latest Ref Rng & Units 12/26/2022    9:41 AM 02/22/2022    2:36 PM  PFT Results  FVC-Pre L 1.99  2.42   FVC-Predicted Pre % 73  87   FVC-Post L 2.25  2.36   FVC-Predicted Post % 82  85   Pre FEV1/FVC % % 95  87   Post FEV1/FCV % % 85  91   FEV1-Pre L 1.90  2.10   FEV1-Predicted Pre % 93  101   FEV1-Post L 1.92  2.14   DLCO uncorrected ml/min/mmHg 14.23  13.36   DLCO UNC% % 71  66   DLVA Predicted % 87  89   TLC L 4.56  3.93   TLC % Predicted % 85  73   RV % Predicted % 81  60  Assessment & Plan:  [name] is a pleasant 83 year old female patient with a past medical history of psoriatic arthritis, OSA on CPAP, right lower lobe bronchiectasis, second-degree heart block status post pacemaker, and a 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  []  Start budesonide -formoterol  [Symbicort ] 160-4.52 puffs twice a day.  AeroChamber was sent as well. []  Continue with albuterol  on an as-needed basis.  #OSA on CPAP Good compliance at 100%.  Sleeps 8 hours a night.  AHI 1.1. Continue with auto CPAP 5 to 15 cm water .  #Surgical clearance for TKR Patient optimized from a respiratory standpoint. ARISCAT 16  indicating low risk for post op pulmonary complications. This should not preclude her from getting a life changing surgery such as TKR.   Return in about 3 months (around 03/26/2024).  I spent 60 minutes caring for this patient today, including preparing to see the patient, obtaining a medical history , reviewing a separately obtained history, performing a medically appropriate examination and/or evaluation, counseling and educating the patient/family/caregiver, ordering medications, tests, or procedures, documenting clinical information in the electronic health record, and independently interpreting results (not separately reported/billed) and communicating results to the patient/family/caregiver  Annitta Kindler, MD [facility] 12/26/2023 9:19 AM

## 2023-12-31 DIAGNOSIS — M1712 Unilateral primary osteoarthritis, left knee: Secondary | ICD-10-CM | POA: Diagnosis not present

## 2023-12-31 DIAGNOSIS — R2681 Unsteadiness on feet: Secondary | ICD-10-CM | POA: Diagnosis not present

## 2023-12-31 DIAGNOSIS — M25562 Pain in left knee: Secondary | ICD-10-CM | POA: Diagnosis not present

## 2024-01-02 DIAGNOSIS — M25562 Pain in left knee: Secondary | ICD-10-CM | POA: Diagnosis not present

## 2024-01-02 DIAGNOSIS — R2681 Unsteadiness on feet: Secondary | ICD-10-CM | POA: Diagnosis not present

## 2024-01-02 DIAGNOSIS — M1712 Unilateral primary osteoarthritis, left knee: Secondary | ICD-10-CM | POA: Diagnosis not present

## 2024-01-07 ENCOUNTER — Other Ambulatory Visit: Payer: Self-pay

## 2024-01-07 DIAGNOSIS — E785 Hyperlipidemia, unspecified: Secondary | ICD-10-CM

## 2024-01-07 DIAGNOSIS — M25562 Pain in left knee: Secondary | ICD-10-CM | POA: Diagnosis not present

## 2024-01-07 DIAGNOSIS — M1712 Unilateral primary osteoarthritis, left knee: Secondary | ICD-10-CM | POA: Diagnosis not present

## 2024-01-07 DIAGNOSIS — R2681 Unsteadiness on feet: Secondary | ICD-10-CM | POA: Diagnosis not present

## 2024-01-07 MED ORDER — ROSUVASTATIN CALCIUM 20 MG PO TABS: 20.0000 mg | ORAL_TABLET | Freq: Every day | ORAL | 3 refills | Status: AC

## 2024-01-08 ENCOUNTER — Other Ambulatory Visit: Payer: Self-pay

## 2024-01-08 DIAGNOSIS — I10 Essential (primary) hypertension: Secondary | ICD-10-CM

## 2024-01-08 MED ORDER — LOSARTAN POTASSIUM 25 MG PO TABS: 25.0000 mg | ORAL_TABLET | Freq: Every day | ORAL | 1 refills | Status: DC

## 2024-01-09 ENCOUNTER — Ambulatory Visit (INDEPENDENT_AMBULATORY_CARE_PROVIDER_SITE_OTHER): Payer: Medicare Other

## 2024-01-09 DIAGNOSIS — I441 Atrioventricular block, second degree: Secondary | ICD-10-CM

## 2024-01-09 DIAGNOSIS — M25562 Pain in left knee: Secondary | ICD-10-CM | POA: Diagnosis not present

## 2024-01-09 DIAGNOSIS — R2681 Unsteadiness on feet: Secondary | ICD-10-CM | POA: Diagnosis not present

## 2024-01-09 DIAGNOSIS — M1712 Unilateral primary osteoarthritis, left knee: Secondary | ICD-10-CM | POA: Diagnosis not present

## 2024-01-10 LAB — CUP PACEART REMOTE DEVICE CHECK
Battery Remaining Longevity: 66 mo
Battery Remaining Percentage: 54 %
Battery Voltage: 2.99 V
Brady Statistic AP VP Percent: 4.9 %
Brady Statistic AP VS Percent: 1 %
Brady Statistic AS VP Percent: 90 %
Brady Statistic AS VS Percent: 3.3 %
Brady Statistic RA Percent Paced: 4.9 %
Brady Statistic RV Percent Paced: 95 %
Date Time Interrogation Session: 20250409020015
Implantable Lead Connection Status: 753985
Implantable Lead Connection Status: 753985
Implantable Lead Implant Date: 20191022
Implantable Lead Implant Date: 20191022
Implantable Lead Location: 753859
Implantable Lead Location: 753860
Implantable Pulse Generator Implant Date: 20191022
Lead Channel Impedance Value: 550 Ohm
Lead Channel Impedance Value: 630 Ohm
Lead Channel Pacing Threshold Amplitude: 0.5 V
Lead Channel Pacing Threshold Amplitude: 0.75 V
Lead Channel Pacing Threshold Pulse Width: 0.5 ms
Lead Channel Pacing Threshold Pulse Width: 0.5 ms
Lead Channel Sensing Intrinsic Amplitude: 12 mV
Lead Channel Sensing Intrinsic Amplitude: 3.4 mV
Lead Channel Setting Pacing Amplitude: 1 V
Lead Channel Setting Pacing Amplitude: 2 V
Lead Channel Setting Pacing Pulse Width: 0.5 ms
Lead Channel Setting Sensing Sensitivity: 2 mV
Pulse Gen Model: 2272
Pulse Gen Serial Number: 9073848

## 2024-01-12 ENCOUNTER — Encounter: Payer: Self-pay | Admitting: Cardiology

## 2024-01-14 DIAGNOSIS — M25562 Pain in left knee: Secondary | ICD-10-CM | POA: Diagnosis not present

## 2024-01-14 DIAGNOSIS — R2681 Unsteadiness on feet: Secondary | ICD-10-CM | POA: Diagnosis not present

## 2024-01-14 DIAGNOSIS — M1712 Unilateral primary osteoarthritis, left knee: Secondary | ICD-10-CM | POA: Diagnosis not present

## 2024-01-16 DIAGNOSIS — M1712 Unilateral primary osteoarthritis, left knee: Secondary | ICD-10-CM | POA: Diagnosis not present

## 2024-01-16 DIAGNOSIS — R2681 Unsteadiness on feet: Secondary | ICD-10-CM | POA: Diagnosis not present

## 2024-01-16 DIAGNOSIS — M25562 Pain in left knee: Secondary | ICD-10-CM | POA: Diagnosis not present

## 2024-01-21 DIAGNOSIS — M25562 Pain in left knee: Secondary | ICD-10-CM | POA: Diagnosis not present

## 2024-01-21 DIAGNOSIS — R2681 Unsteadiness on feet: Secondary | ICD-10-CM | POA: Diagnosis not present

## 2024-01-21 DIAGNOSIS — M1712 Unilateral primary osteoarthritis, left knee: Secondary | ICD-10-CM | POA: Diagnosis not present

## 2024-01-23 ENCOUNTER — Ambulatory Visit: Admitting: Primary Care

## 2024-01-23 DIAGNOSIS — M1712 Unilateral primary osteoarthritis, left knee: Secondary | ICD-10-CM | POA: Diagnosis not present

## 2024-01-23 DIAGNOSIS — R2681 Unsteadiness on feet: Secondary | ICD-10-CM | POA: Diagnosis not present

## 2024-01-23 DIAGNOSIS — M25562 Pain in left knee: Secondary | ICD-10-CM | POA: Diagnosis not present

## 2024-01-28 DIAGNOSIS — H47011 Ischemic optic neuropathy, right eye: Secondary | ICD-10-CM | POA: Diagnosis not present

## 2024-01-28 DIAGNOSIS — M1712 Unilateral primary osteoarthritis, left knee: Secondary | ICD-10-CM | POA: Diagnosis not present

## 2024-01-28 DIAGNOSIS — H2513 Age-related nuclear cataract, bilateral: Secondary | ICD-10-CM | POA: Diagnosis not present

## 2024-01-28 DIAGNOSIS — H0100A Unspecified blepharitis right eye, upper and lower eyelids: Secondary | ICD-10-CM | POA: Diagnosis not present

## 2024-01-28 DIAGNOSIS — M25562 Pain in left knee: Secondary | ICD-10-CM | POA: Diagnosis not present

## 2024-01-28 DIAGNOSIS — H43813 Vitreous degeneration, bilateral: Secondary | ICD-10-CM | POA: Diagnosis not present

## 2024-01-28 DIAGNOSIS — R2681 Unsteadiness on feet: Secondary | ICD-10-CM | POA: Diagnosis not present

## 2024-01-30 DIAGNOSIS — M1712 Unilateral primary osteoarthritis, left knee: Secondary | ICD-10-CM | POA: Diagnosis not present

## 2024-01-30 DIAGNOSIS — M25562 Pain in left knee: Secondary | ICD-10-CM | POA: Diagnosis not present

## 2024-01-30 DIAGNOSIS — R2681 Unsteadiness on feet: Secondary | ICD-10-CM | POA: Diagnosis not present

## 2024-02-04 DIAGNOSIS — M25562 Pain in left knee: Secondary | ICD-10-CM | POA: Diagnosis not present

## 2024-02-04 DIAGNOSIS — M1712 Unilateral primary osteoarthritis, left knee: Secondary | ICD-10-CM | POA: Diagnosis not present

## 2024-02-04 DIAGNOSIS — R2681 Unsteadiness on feet: Secondary | ICD-10-CM | POA: Diagnosis not present

## 2024-02-05 DIAGNOSIS — M1712 Unilateral primary osteoarthritis, left knee: Secondary | ICD-10-CM | POA: Diagnosis not present

## 2024-02-06 ENCOUNTER — Telehealth: Payer: Self-pay

## 2024-02-06 ENCOUNTER — Telehealth: Payer: Self-pay | Admitting: Cardiovascular Disease

## 2024-02-06 DIAGNOSIS — R2681 Unsteadiness on feet: Secondary | ICD-10-CM | POA: Diagnosis not present

## 2024-02-06 DIAGNOSIS — M1712 Unilateral primary osteoarthritis, left knee: Secondary | ICD-10-CM | POA: Diagnosis not present

## 2024-02-06 DIAGNOSIS — M25562 Pain in left knee: Secondary | ICD-10-CM | POA: Diagnosis not present

## 2024-02-06 NOTE — Telephone Encounter (Signed)
   Pre-operative Risk Assessment    Patient Name: Alison Huffman  DOB: 1941-07-29 MRN: [id number]   Date of last office visit: 06/01/23 Date of next office visit: n/a   Request for Surgical Clearance    Procedure:  Left TKA  Date of Surgery:  Clearance TBD                                Surgeon:  Venus Ginsberg, MD Surgeon's Group or Practice Name:  [facility] Phone number:  [phone] Fax number:  [phone]   Type of Clearance Requested:   - Medical    Type of Anesthesia:  Not Indicated   Additional requests/questions:    Signed, Fletcher Humble   02/06/2024, 4:22 PM

## 2024-02-06 NOTE — Telephone Encounter (Signed)
 Surgical clearance form was received from front staff for pt. Forms have been placed in provider to be signed folder.

## 2024-02-06 NOTE — Telephone Encounter (Signed)
 We received a request for surgical clearance from [facility] today via fax.  I sent a copy to Bluford Burkitt, NP's folder on the S drive and hand-delivered a copy to Jackline Maser, CMA.

## 2024-02-07 ENCOUNTER — Telehealth: Payer: Self-pay | Admitting: *Deleted

## 2024-02-07 NOTE — Telephone Encounter (Signed)
 CORRECTION ON DATE OF TELE APPT IS 02/21/24    I called the pt back to confirm that I did tell her the correct date for tele appt 02/21/24

## 2024-02-07 NOTE — Telephone Encounter (Signed)
   Name: Alison Huffman  DOB: 06/20/1941  MRN: [id number]  Primary Cardiologist: None   Preoperative team, please contact this patient and set up a phone call appointment for further preoperative risk assessment. Please obtain consent and complete medication review. Thank you for your help.  I confirm that guidance regarding antiplatelet and oral anticoagulation therapy has been completed and, if necessary, noted below.  None requested.   I also confirmed the patient resides in the state of [place] . As per [facility] Medical Board telemedicine laws, the patient must reside in the state in which the provider is licensed.   Morey Ar, NP 02/07/2024, 4:08 PM [facility]

## 2024-02-07 NOTE — Telephone Encounter (Signed)
 Pt has been scheduled tele preop appt 02/13/24. Med rec and consent are done.

## 2024-02-07 NOTE — Progress Notes (Signed)
 PERIOPERATIVE PRESCRIPTION FOR IMPLANTED CARDIAC DEVICE PROGRAMMING   Patient Information:  Patient: Alison Huffman  MRN: [id number]  Date of Birth: [birth year]   Procedure:  Left TKA Date of Surgery:  Clearance TBD                              Surgeon:  Venus Ginsberg, MD Surgeon's Group or Practice Name:  [facility] Phone number:  [phone] Fax number:  [phone]  Device Information:   Clinic EP Physician:   Dr. Harvie Liner Device Type:  Pacemaker Manufacturer and Phone #:  [employer]: [phone] Pacemaker Dependent?:  Yes Date of Last Device Check:  01/09/2024        Normal Device Function?:  Yes     Electrophysiologist's Recommendations:   Have magnet available. Provide continuous ECG monitoring when magnet is used or reprogramming is to be performed.  Procedure should not interfere with device function.  No device programming or magnet placement needed.  Per Device Clinic Standing Orders, [name]  02/07/2024 4:31 PM

## 2024-02-07 NOTE — Telephone Encounter (Signed)
 Pt has been scheduled tele preop appt 02/13/24. Med rec and consent are done.       Patient Consent for Virtual Visit        Alison Huffman has provided verbal consent on 02/07/2024 for a virtual visit (video or telephone).   CONSENT FOR VIRTUAL VISIT FOR:  Alison Huffman  By participating in this virtual visit I agree to the following:  I hereby voluntarily request, consent and authorize [facility] and its employed or contracted physicians, physician assistants, nurse practitioners or other licensed health care professionals (the Practitioner), to provide me with telemedicine health care services (the "Services") as deemed necessary by the treating Practitioner. I acknowledge and consent to receive the Services by the Practitioner via telemedicine. I understand that the telemedicine visit will involve communicating with the Practitioner through live audiovisual communication technology and the disclosure of certain medical information by electronic transmission. I acknowledge that I have been given the opportunity to request an in-person assessment or other available alternative prior to the telemedicine visit and am voluntarily participating in the telemedicine visit.  I understand that I have the right to withhold or withdraw my consent to the use of telemedicine in the course of my care at any time, without affecting my right to future care or treatment, and that the Practitioner or I may terminate the telemedicine visit at any time. I understand that I have the right to inspect all information obtained and/or recorded in the course of the telemedicine visit and may receive copies of available information for a reasonable fee.  I understand that some of the potential risks of receiving the Services via telemedicine include:  Delay or interruption in medical evaluation due to technological equipment failure or disruption; Information transmitted may not be sufficient (e.g. poor  resolution of images) to allow for appropriate medical decision making by the Practitioner; and/or  In rare instances, security protocols could fail, causing a breach of personal health information.  Furthermore, I acknowledge that it is my responsibility to provide information about my medical history, conditions and care that is complete and accurate to the best of my ability. I acknowledge that Practitioner's advice, recommendations, and/or decision may be based on factors not within their control, such as incomplete or inaccurate data provided by me or distortions of diagnostic images or specimens that may result from electronic transmissions. I understand that the practice of medicine is not an exact science and that Practitioner makes no warranties or guarantees regarding treatment outcomes. I acknowledge that a copy of this consent can be made available to me via my patient portal [facility] MyChart), or I can request a printed copy by calling the office of [facility].    I understand that my insurance will be billed for this visit.   I have read or had this consent read to me. I understand the contents of this consent, which adequately explains the benefits and risks of the Services being provided via telemedicine.  I have been provided ample opportunity to ask questions regarding this consent and the Services and have had my questions answered to my satisfaction. I give my informed consent for the services to be provided through the use of telemedicine in my medical care

## 2024-02-11 DIAGNOSIS — M25562 Pain in left knee: Secondary | ICD-10-CM | POA: Diagnosis not present

## 2024-02-11 DIAGNOSIS — R2681 Unsteadiness on feet: Secondary | ICD-10-CM | POA: Diagnosis not present

## 2024-02-11 DIAGNOSIS — M1712 Unilateral primary osteoarthritis, left knee: Secondary | ICD-10-CM | POA: Diagnosis not present

## 2024-02-12 NOTE — Telephone Encounter (Signed)
 Faxed to [phone] with a completed transmission log

## 2024-02-13 DIAGNOSIS — M1712 Unilateral primary osteoarthritis, left knee: Secondary | ICD-10-CM | POA: Diagnosis not present

## 2024-02-13 DIAGNOSIS — M25562 Pain in left knee: Secondary | ICD-10-CM | POA: Diagnosis not present

## 2024-02-13 DIAGNOSIS — R2681 Unsteadiness on feet: Secondary | ICD-10-CM | POA: Diagnosis not present

## 2024-02-18 DIAGNOSIS — M1712 Unilateral primary osteoarthritis, left knee: Secondary | ICD-10-CM | POA: Diagnosis not present

## 2024-02-18 DIAGNOSIS — M25562 Pain in left knee: Secondary | ICD-10-CM | POA: Diagnosis not present

## 2024-02-18 DIAGNOSIS — R2681 Unsteadiness on feet: Secondary | ICD-10-CM | POA: Diagnosis not present

## 2024-02-19 ENCOUNTER — Encounter (INDEPENDENT_AMBULATORY_CARE_PROVIDER_SITE_OTHER): Payer: Self-pay

## 2024-02-20 DIAGNOSIS — M1712 Unilateral primary osteoarthritis, left knee: Secondary | ICD-10-CM | POA: Diagnosis not present

## 2024-02-20 DIAGNOSIS — R2681 Unsteadiness on feet: Secondary | ICD-10-CM | POA: Diagnosis not present

## 2024-02-20 DIAGNOSIS — M25562 Pain in left knee: Secondary | ICD-10-CM | POA: Diagnosis not present

## 2024-02-21 ENCOUNTER — Ambulatory Visit: Attending: Cardiology

## 2024-02-21 DIAGNOSIS — Z01810 Encounter for preprocedural cardiovascular examination: Secondary | ICD-10-CM | POA: Insufficient documentation

## 2024-02-21 NOTE — Progress Notes (Signed)
 Virtual Visit via Telephone Note   Because of Alison Huffman co-morbid illnesses, she is at least at moderate risk for complications without adequate follow up.  This format is felt to be most appropriate for this patient at this time.  Due to technical limitations with video connection (technology), today's appointment will be conducted as an audio only telehealth visit, and Alison Huffman verbally agreed to proceed in this manner.   All issues noted in this document were discussed and addressed.  No physical exam could be performed with this format.  Evaluation Performed:  Preoperative cardiovascular risk assessment _____________   Date:  02/21/2024   Patient ID:  Alison Huffman, DOB [birth year], MRN [id number] Patient Location:  Home Provider location:   Office  Primary Care Provider:  Bluford Burkitt, NP Primary Cardiologist:  None  Chief Complaint / Patient Profile   83 y.o. y/o female with a h/o 3 AVB s/p PPM, A. tach, CAD, HTN, OSA, Guillain-Barr, HFpEF who is pending left total knee arthroplasty and presents today for telephonic preoperative cardiovascular risk assessment.  History of Present Illness    Alison Huffman is a 83 y.o. female who presents via audio/video conferencing for a telehealth visit today.  Pt was last seen in cardiology clinic on 10/08/2023 by Alison San, NP.  At that time Alison Huffman was doing well but noted some lower extremity edema and was advised to take extra dose of Lasix .  She underwent an updated 2D echo showed no acute changes.  The patient is now pending procedure as outlined above. Since her last visit, she has been doing well with no new cardiac complaints.  She reports that her swelling has been under control with as needed Lasix  and she notes occasional shortness of breath that resolves with rest. She denies chest pain, lower extremity edema, fatigue, palpitations, melena, hematuria, hemoptysis, diaphoresis, weakness, presyncope, syncope,  orthopnea, and PND.   Past Medical History    Past Medical History:  Diagnosis Date   Ankle pain, right    Atypical chest pain    a. 10/2018 MV: EF 57%. No ischemia/infarct.   Coronary artery disease    Diastolic dysfunction    a. 04/2018 Echo: EF 55-60%, no rwma, Gr1 DD. Triv AI, mild MR. Mod dil LA. Nl RV fxn. PASP nl.   GERD (gastroesophageal reflux disease)    Guillain-Barre (HCC)    Headache    "dull one sometimes daily, at least weekly in last couple months" (07/22/2018)   Hyperlipidemia    Hypertension    Inflammatory neuropathy (HCC)    Joint pain in fingers of right hand    Lichen sclerosus    Osteopenia    Pneumonia 2012? 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 07/2018 s/p SJM Assurity MRI model [id number] (Ser# [id number]).   Sleep apnea    "using nasal pilllows since ~ 12/2017" (07/22/2018)   TMJ (dislocation of temporomandibular joint)    Past Surgical History:  Procedure Laterality Date   APPENDECTOMY     BREAST CYST ASPIRATION Bilateral    BREAST CYST EXCISION Right 1978   benign   COLONOSCOPY     COLONOSCOPY WITH PROPOFOL  N/A 07/10/2019   Procedure: COLONOSCOPY  WITH PROPOFOL ;  Surgeon: Alison Fly, MD;  Location: [facility] ENDOSCOPY;  Service: Endoscopy;  Laterality: N/A;   ESOPHAGOGASTRODUODENOSCOPY (EGD) WITH PROPOFOL  N/A 07/10/2019   Procedure: ESOPHAGOGASTRODUODENOSCOPY (EGD) WITH PROPOFOL ;  Surgeon: Alison Fly, MD;  Location: [facility] ENDOSCOPY;  Service: Endoscopy;  Laterality: N/A;   HYSTEROSCOPY WITH D & C N/A 11/05/2018   Procedure: DILATATION AND CURETTAGE /HYSTEROSCOPY;  Surgeon: Alison Lord, MD;  Location: [facility] ORS;  Service: Gynecology;  Laterality: N/A;   INSERT / REPLACE / REMOVE PACEMAKER     PACEMAKER IMPLANT N/A 07/23/2018    SJM Assurity 2272 implanted by Dr Alison Huffman for mobitz II second degree AV block   TOOTH EXTRACTION  12/05/2023   UTERINE POLYPS REMOVAL      Allergies  Allergies  Allergen Reactions   Etanercept     Guillain Baree   Nickel     Home Medications    Prior to Admission medications   Medication Sig Start Date End Date Taking? Authorizing Provider  acetaminophen  (TYLENOL ) 325 MG tablet Take 325-650 mg by mouth every 6 (six) hours as needed for mild pain or headache.     [provider]  albuterol  (VENTOLIN  HFA) 108 (90 Base) MCG/ACT inhaler Inhale 2 puffs into the lungs every 6 (six) hours as needed for wheezing or shortness of breath. 12/26/22   Huffman, Alison Shines, NP  budesonide -formoterol  (SYMBICORT ) 160-4.5 MCG/ACT inhaler Inhale 2 puffs into the lungs in the morning and at bedtime. 12/25/23   Alison Kindler, MD  clobetasol  ointment (TEMOVATE ) 0.05 %  03/08/99   [provider]  furosemide  (LASIX ) 20 MG tablet TAKE ONE TABLET BY MOUTH EVERY DAY AS NEEDED FOR EDEMA 09/22/22   Alison Pear, MD  ketoconazole  (NIZORAL ) 2 % cream Apply 1 application topically daily. 08/06/20   Alison Pear, MD  losartan  (COZAAR ) 25 MG tablet Take 1 tablet (25 mg total) by mouth daily. 01/08/24   Alison Burkitt, NP  methylcellulose (CITRUCEL) oral powder  01/31/20   [provider]  Naproxen Sodium (ALEVE PO) Take by mouth as needed.    [provider]  polyethylene glycol (MIRALAX  / GLYCOLAX ) 17 g packet Take 17 g by mouth daily as needed for severe constipation. 04/02/20   Huffman, Alison Tublu, MD  rosuvastatin  (CRESTOR ) 20 MG tablet Take 1 tablet (20 mg total) by mouth daily. 01/07/24   Alison Burkitt, NP  Spacer/Aero-Holding Idelle Majors (AEROCHAMBER MV) inhaler Use as instructed 12/25/23   Huffman, Alison Shirts, MD  tacrolimus  (PROTOPIC ) 0.1 % ointment Apply topically 2 (two) times daily as needed (for psoriasis).     [provider]  Vitamin D , Ergocalciferol ,  50 MCG (2000 UT) CAPS Take by mouth.    [provider]    Physical Exam    Vital Signs:  AERICA RINCON does not have vital signs available for review today.128/82  Given telephonic nature of communication, physical exam is limited. AAOx3. NAD. Normal affect.  Speech and respirations are unlabored.  Accessory Clinical Findings    None  Assessment & Plan    1.  Preoperative Cardiovascular Risk Assessment: - Patient's RCRI score 6.6%  The patient affirms she has been doing well without any new cardiac symptoms. They are able to achieve 5 METS without cardiac limitations. Therefore, based on ACC/AHA guidelines, the patient would be at acceptable risk for the planned procedure without further cardiovascular testing. The patient was advised that if she develops new symptoms prior to surgery to contact our office to arrange  for a follow-up visit, and she verbalized understanding.   The patient was advised that if she develops new symptoms prior to surgery to contact our office to arrange for a follow-up visit, and she verbalized understanding.  No medications to hold for procedure A copy of this note will be routed to requesting surgeon.  Time:   Today, I have spent 8 minutes with the patient with telehealth technology discussing medical history, symptoms, and management plan.     Francene Ing, Retha Cast, NP  02/21/2024, 7:25 AM

## 2024-02-22 NOTE — Progress Notes (Signed)
 Remote pacemaker transmission.

## 2024-02-22 NOTE — Addendum Note (Signed)
 Addended by: Lott Rouleau A on: 02/22/2024 11:40 AM   Modules accepted: Orders

## 2024-02-27 ENCOUNTER — Other Ambulatory Visit: Payer: Self-pay | Admitting: Orthopedic Surgery

## 2024-02-27 DIAGNOSIS — R2681 Unsteadiness on feet: Secondary | ICD-10-CM | POA: Diagnosis not present

## 2024-02-27 DIAGNOSIS — M1712 Unilateral primary osteoarthritis, left knee: Secondary | ICD-10-CM | POA: Diagnosis not present

## 2024-02-27 DIAGNOSIS — M25562 Pain in left knee: Secondary | ICD-10-CM | POA: Diagnosis not present

## 2024-02-29 DIAGNOSIS — M25562 Pain in left knee: Secondary | ICD-10-CM | POA: Diagnosis not present

## 2024-02-29 DIAGNOSIS — M1712 Unilateral primary osteoarthritis, left knee: Secondary | ICD-10-CM | POA: Diagnosis not present

## 2024-02-29 DIAGNOSIS — R2681 Unsteadiness on feet: Secondary | ICD-10-CM | POA: Diagnosis not present

## 2024-03-03 DIAGNOSIS — R2681 Unsteadiness on feet: Secondary | ICD-10-CM | POA: Diagnosis not present

## 2024-03-03 DIAGNOSIS — M25562 Pain in left knee: Secondary | ICD-10-CM | POA: Diagnosis not present

## 2024-03-03 DIAGNOSIS — M1712 Unilateral primary osteoarthritis, left knee: Secondary | ICD-10-CM | POA: Diagnosis not present

## 2024-03-05 DIAGNOSIS — M25562 Pain in left knee: Secondary | ICD-10-CM | POA: Diagnosis not present

## 2024-03-05 DIAGNOSIS — R2681 Unsteadiness on feet: Secondary | ICD-10-CM | POA: Diagnosis not present

## 2024-03-05 DIAGNOSIS — M1712 Unilateral primary osteoarthritis, left knee: Secondary | ICD-10-CM | POA: Diagnosis not present

## 2024-03-06 ENCOUNTER — Encounter
Admission: RE | Admit: 2024-03-06 | Discharge: 2024-03-06 | Disposition: A | Discharge status: Home / Self Care | Source: Ambulatory Visit | Attending: Orthopedic Surgery | Admitting: Orthopedic Surgery

## 2024-03-06 ENCOUNTER — Other Ambulatory Visit: Payer: Self-pay

## 2024-03-06 VITALS — BP 141/61 | HR 70 | Resp 16 | Ht 66.0 in | Wt 181.0 lb

## 2024-03-06 DIAGNOSIS — E119 Type 2 diabetes mellitus without complications: Secondary | ICD-10-CM | POA: Diagnosis not present

## 2024-03-06 DIAGNOSIS — Z01812 Encounter for preprocedural laboratory examination: Secondary | ICD-10-CM | POA: Diagnosis present

## 2024-03-06 DIAGNOSIS — Z01818 Encounter for other preprocedural examination: Secondary | ICD-10-CM | POA: Diagnosis not present

## 2024-03-06 DIAGNOSIS — Z01810 Encounter for preprocedural cardiovascular examination: Secondary | ICD-10-CM | POA: Diagnosis present

## 2024-03-06 HISTORY — DX: Dyspnea, unspecified: R06.00

## 2024-03-06 HISTORY — DX: Unilateral primary osteoarthritis, left knee: M17.12

## 2024-03-06 HISTORY — DX: Prediabetes: R73.03

## 2024-03-06 LAB — COMPREHENSIVE METABOLIC PANEL WITH GFR
ALT: 22 U/L (ref 0–44)
AST: 23 U/L (ref 15–41)
Albumin: 4.1 g/dL (ref 3.5–5.0)
Alkaline Phosphatase: 74 U/L (ref 38–126)
Anion gap: 6 (ref 5–15)
BUN: 14 mg/dL (ref 8–23)
CO2: 26 mmol/L (ref 22–32)
Calcium: 9 mg/dL (ref 8.9–10.3)
Chloride: 105 mmol/L (ref 98–111)
Creatinine, Ser: 0.56 mg/dL (ref 0.44–1.00)
GFR, Estimated: >60 mL/min (ref >60)
Glucose, Bld: 91 mg/dL (ref 70–99)
Potassium: 3.8 mmol/L (ref 3.5–5.1)
Sodium: 137 mmol/L (ref 135–145)
Total Bilirubin: 0.7 mg/dL (ref 0.0–1.2)
Total Protein: 7 g/dL (ref 6.5–8.1)

## 2024-03-06 LAB — URINALYSIS, ROUTINE W REFLEX MICROSCOPIC
Bilirubin Urine: NEGATIVE
Glucose, UA: NEGATIVE mg/dL
Hgb urine dipstick: NEGATIVE
Ketones, ur: NEGATIVE mg/dL
Leukocytes,Ua: NEGATIVE
Nitrite: NEGATIVE
Protein, ur: NEGATIVE mg/dL
Specific Gravity, Urine: 1.004 — ABNORMAL LOW (ref 1.005–1.030)
pH: 6 (ref 5.0–8.0)

## 2024-03-06 LAB — CBC WITH DIFFERENTIAL/PLATELET
Abs Immature Granulocytes: 0.01 10*3/uL (ref 0.00–0.07)
Basophils Absolute: 0 10*3/uL (ref 0.0–0.1)
Basophils Relative: 1 %
Eosinophils Absolute: 0.2 10*3/uL (ref 0.0–0.5)
Eosinophils Relative: 3 %
HCT: 38.3 % (ref 36.0–46.0)
Hemoglobin: 12.4 g/dL (ref 12.0–15.0)
Immature Granulocytes: 0 %
Lymphocytes Relative: 26 %
Lymphs Abs: 1.4 10*3/uL (ref 0.7–4.0)
MCH: 31 pg (ref 26.0–34.0)
MCHC: 32.4 g/dL (ref 30.0–36.0)
MCV: 95.8 fL (ref 80.0–100.0)
Monocytes Absolute: 0.5 10*3/uL (ref 0.1–1.0)
Monocytes Relative: 9 %
Neutro Abs: 3.2 10*3/uL (ref 1.7–7.7)
Neutrophils Relative %: 61 %
Platelets: 201 10*3/uL (ref 150–400)
RBC: 4 MIL/uL (ref 3.87–5.11)
RDW: 12.9 % (ref 11.5–15.5)
WBC: 5.3 10*3/uL (ref 4.0–10.5)
nRBC: 0 % (ref 0.0–0.2)

## 2024-03-06 LAB — SURGICAL PCR SCREEN
MRSA, PCR: NEGATIVE
Staphylococcus aureus: NEGATIVE

## 2024-03-06 NOTE — Patient Instructions (Addendum)
 Your procedure is scheduled on: 03/17/24 - Monday Report to the Registration Desk on the 1st floor of the [facility]. To find out your arrival time, please call [phone] between 1PM - 3PM on: 03/14/24 - Friday If your arrival time is 6:00 am, do not arrive before that time as the [facility] entrance doors do not open until 6:00 am.  REMEMBER: Instructions that are not followed completely may result in serious medical risk, up to and including death; or upon the discretion of your surgeon and anesthesiologist your surgery may need to be rescheduled.  Do not eat food after midnight the night before surgery.  No gum chewing or hard candies.  You may however, drink CLEAR liquids up to 2 hours before you are scheduled to arrive for your surgery. Do not drink anything within 2 hours of your scheduled arrival time.  Clear liquids include: - water     In addition, your doctor has ordered for you to drink the provided:  Gatorade G2 Drinking this carbohydrate drink up to two hours before surgery helps to reduce insulin  resistance and improve patient outcomes. Please complete drinking 2 hours before scheduled arrival time.  One week prior to surgery beginning 06/09: Stop Anti-inflammatories (NSAIDS) such as Advil, Aleve, Ibuprofen, Motrin, Naproxen, Naprosyn and Aspirin based products such as Excedrin, Goody's Powder, BC Powder. You may take Tylenol  if needed for pain up until the day of surgery.  Stop taking beginning 06/09, ANY OVER THE COUNTER supplements until after surgery : Vitamin D    Continue taking all of your other prescription medications up until the day before surgery.  ON THE DAY OF SURGERY ONLY TAKE THESE MEDICATIONS WITH SIPS OF WATER :  SYMBICORT     No Alcohol for 24 hours before or after surgery.  No Smoking including e-cigarettes for 24 hours before surgery.  No chewable tobacco products for at least 6 hours before surgery.  No nicotine patches on the day of  surgery.  Do not use any "recreational" drugs for at least a week (preferably 2 weeks) before your surgery.  Please be advised that the combination of cocaine and anesthesia may have negative outcomes, up to and including death. If you test positive for cocaine, your surgery will be cancelled.  On the morning of surgery brush your teeth with toothpaste and water , you may rinse your mouth with mouthwash if you wish. Do not swallow any toothpaste or mouthwash.  Use CHG Soap or wipes as directed on instruction sheet.  Do not wear jewelry, make-up, hairpins, clips or nail polish.  For welded (permanent) jewelry: bracelets, anklets, waist bands, etc.  Please have this removed prior to surgery.  If it is not removed, there is a chance that hospital personnel will need to cut it off on the day of surgery.  Do not wear lotions, powders, or perfumes.   Do not shave body hair from the neck down 48 hours before surgery.  Contact lenses, hearing aids and dentures may not be worn into surgery.  Do not bring valuables to the hospital. [facility] is not responsible for any missing/lost belongings or valuables.   Notify your doctor if there is any change in your medical condition (cold, fever, infection).  Wear comfortable clothing (specific to your surgery type) to the hospital.  After surgery, you can help prevent lung complications by doing breathing exercises.  Take deep breaths and cough every 1-2 hours. Your doctor may order a device called an Incentive Spirometer to help you take  deep breaths.  When coughing or sneezing, hold a pillow firmly against your incision with both hands. This is called "splinting." Doing this helps protect your incision. It also decreases belly discomfort.  If you are being admitted to the hospital overnight, leave your suitcase in the car. After surgery it may be brought to your room.  In case of increased patient census, it may be necessary for you, the patient,  to continue your postoperative care in the Same Day Surgery department.  If you are being discharged the day of surgery, you will not be allowed to drive home. You will need a responsible individual to drive you home and stay with you for 24 hours after surgery.   If you are taking public transportation, you will need to have a responsible individual with you.  Please call the Pre-admissions Testing Dept. at [phone] if you have any questions about these instructions.  Surgery Visitation Policy:  Patients having surgery or a procedure may have two visitors.  Children under the age of 79 must have an adult with them who is not the patient.  Inpatient Visitation:    Visiting hours are 7 a.m. to 8 p.m. Up to four visitors are allowed at one time in a patient room. The visitors may rotate out with other people during the day.  One visitor age 42 or older may stay with the patient overnight and must be in the room by 8 p.m.    Pre-operative 5 CHG Bath Instructions   You can play a key role in reducing the risk of infection after surgery. Your skin needs to be as free of germs as possible. You can reduce the number of germs on your skin by washing with CHG (chlorhexidine  gluconate) soap before surgery. CHG is an antiseptic soap that kills germs and continues to kill germs even after washing.   DO NOT use if you have an allergy to chlorhexidine /CHG or antibacterial soaps. If your skin becomes reddened or irritated, stop using the CHG and notify one of our RNs at [phone].   Please shower with the CHG soap starting 4 days before surgery using the following schedule: 06/12 - 06/16.    Please keep in mind the following:  DO NOT shave, including legs and underarms, starting the day of your first shower.   You may shave your face at any point before/day of surgery.  Place clean sheets on your bed the day you start using CHG soap. Use a clean washcloth (not used since being washed)  for each shower. DO NOT sleep with pets once you start using the CHG.   CHG Shower Instructions:  If you choose to wash your hair and private area, wash first with your normal shampoo/soap.  After you use shampoo/soap, rinse your hair and body thoroughly to remove shampoo/soap residue.  Turn the water  OFF and apply about 3 tablespoons (45 ml) of CHG soap to a CLEAN washcloth.  Apply CHG soap ONLY FROM YOUR NECK DOWN TO YOUR TOES (washing for 3-5 minutes)  DO NOT use CHG soap on face, private areas, open wounds, or sores.  Pay special attention to the area where your surgery is being performed.  If you are having back surgery, having someone wash your back for you may be helpful. Wait 2 minutes after CHG soap is applied, then you may rinse off the CHG soap.  Pat dry with a clean towel  Put on clean clothes/pajamas   If you choose to wear  lotion, please use ONLY the CHG-compatible lotions on the back of this paper.     Additional instructions for the day of surgery: DO NOT APPLY any lotions, deodorants, cologne, or perfumes.   Put on clean/comfortable clothes.  Brush your teeth.  Ask your nurse before applying any prescription medications to the skin.      CHG Compatible Lotions   Aveeno Moisturizing lotion  Cetaphil Moisturizing Cream  Cetaphil Moisturizing Lotion  Clairol Herbal Essence Moisturizing Lotion, Dry Skin  Clairol Herbal Essence Moisturizing Lotion, Extra Dry Skin  Clairol Herbal Essence Moisturizing Lotion, Normal Skin  Curel Age Defying Therapeutic Moisturizing Lotion with Alpha Hydroxy  Curel Extreme Care Body Lotion  Curel Soothing Hands Moisturizing Hand Lotion  Curel Therapeutic Moisturizing Cream, Fragrance-Free  Curel Therapeutic Moisturizing Lotion, Fragrance-Free  Curel Therapeutic Moisturizing Lotion, Original Formula  Eucerin Daily Replenishing Lotion  Eucerin Dry Skin Therapy Plus Alpha Hydroxy Crme  Eucerin Dry Skin Therapy Plus Alpha Hydroxy  Lotion  Eucerin Original Crme  Eucerin Original Lotion  Eucerin Plus Crme Eucerin Plus Lotion  Eucerin TriLipid Replenishing Lotion  Keri Anti-Bacterial Hand Lotion  Keri Deep Conditioning Original Lotion Dry Skin Formula Softly Scented  Keri Deep Conditioning Original Lotion, Fragrance Free Sensitive Skin Formula  Keri Lotion Fast Absorbing Fragrance Free Sensitive Skin Formula  Keri Lotion Fast Absorbing Softly Scented Dry Skin Formula  Keri Original Lotion  Keri Skin Renewal Lotion Keri Silky Smooth Lotion  Keri Silky Smooth Sensitive Skin Lotion  Nivea Body Creamy Conditioning Oil  Nivea Body Extra Enriched Lotion  Nivea Body Original Lotion  Nivea Body Sheer Moisturizing Lotion Nivea Crme  Nivea Skin Firming Lotion  NutraDerm 30 Skin Lotion  NutraDerm Skin Lotion  NutraDerm Therapeutic Skin Cream  NutraDerm Therapeutic Skin Lotion  ProShield Protective Hand Cream  Provon moisturizing lotion  How to Use an Incentive Spirometer  An incentive spirometer is a tool that measures how well you are filling your lungs with each breath. Learning to take long, deep breaths using this tool can help you keep your lungs clear and active. This may help to reverse or lessen your chance of developing breathing (pulmonary) problems, especially infection. You may be asked to use a spirometer: After a surgery. If you have a lung problem or a history of smoking. After a long period of time when you have been unable to move or be active. If the spirometer includes an indicator to show the highest number that you have reached, your health care provider or respiratory therapist will help you set a goal. Keep a log of your progress as told by your health care provider. What are the risks? Breathing too quickly may cause dizziness or cause you to pass out. Take your time so you do not get dizzy or light-headed. If you are in pain, you may need to take pain medicine before doing incentive spirometry.  It is harder to take a deep breath if you are having pain. How to use your incentive spirometer  Sit up on the edge of your bed or on a chair. Hold the incentive spirometer so that it is in an upright position. Before you use the spirometer, breathe out normally. Place the mouthpiece in your mouth. Make sure your lips are closed tightly around it. Breathe in slowly and as deeply as you can through your mouth, causing the piston or the ball to rise toward the top of the chamber. Hold your breath for 3-5 seconds, or for as long as possible.  If the spirometer includes a coach indicator, use this to guide you in breathing. Slow down your breathing if the indicator goes above the marked areas. Remove the mouthpiece from your mouth and breathe out normally. The piston or ball will return to the bottom of the chamber. Rest for a few seconds, then repeat the steps 10 or more times. Take your time and take a few normal breaths between deep breaths so that you do not get dizzy or light-headed. Do this every 1-2 hours when you are awake. If the spirometer includes a goal marker to show the highest number you have reached (best effort), use this as a goal to work toward during each repetition. After each set of 10 deep breaths, cough a few times. This will help to make sure that your lungs are clear. If you have an incision on your chest or abdomen from surgery, place a pillow or a rolled-up towel firmly against the incision when you cough. This can help to reduce pain while taking deep breaths and coughing. General tips When you are able to get out of bed: Walk around often. Continue to take deep breaths and cough in order to clear your lungs. Keep using the incentive spirometer until your health care provider says it is okay to stop using it. If you have been in the hospital, you may be told to keep using the spirometer at home. Contact a health care provider if: You are having difficulty using the  spirometer. You have trouble using the spirometer as often as instructed. Your pain medicine is not giving enough relief for you to use the spirometer as told. You have a fever. Get help right away if: You develop shortness of breath. You develop a cough with bloody mucus from the lungs. You have fluid or blood coming from an incision site after you cough. Summary An incentive spirometer is a tool that can help you learn to take long, deep breaths to keep your lungs clear and active. You may be asked to use a spirometer after a surgery, if you have a lung problem or a history of smoking, or if you have been inactive for a long period of time. Use your incentive spirometer as instructed every 1-2 hours while you are awake. If you have an incision on your chest or abdomen, place a pillow or a rolled-up towel firmly against your incision when you cough. This will help to reduce pain. Get help right away if you have shortness of breath, you cough up bloody mucus, or blood comes from your incision when you cough. This information is not intended to replace advice given to you by your health care provider. Make sure you discuss any questions you have with your health care provider. Document Revised: 12/08/2019 Document Reviewed: 12/08/2019 Elsevier Patient Education  2023 [employer]  POLAR CARE INFORMATION  MassAdvertisement.it  How to use [facility] Therapy System?  YouTube   ShippingScam.co.uk  OPERATING INSTRUCTIONS  Start the product With dry hands, connect the transformer to the electrical connection located on the top of the cooler. Next, plug the transformer into an appropriate electrical outlet. The unit will automatically start running at this point.  To stop the pump, disconnect electrical power.  Unplug to stop the product when not in use. Unplugging the Polar Care unit turns it off. Always unplug immediately after use. Never leave it plugged  in while unattended. Remove pad.    FIRST ADD WATER  TO FILL LINE,  THEN ICE---Replace ice when existing ice is almost melted  1 Discuss Treatment with your Licensed Health Care Practitioner and Use Only as Prescribed 2 Apply Insulation Barrier & Cold Therapy Pad 3 Check for Moisture 4 Inspect Skin Regularly  Tips and Trouble Shooting Usage Tips 1. Use cubed or chunked ice for optimal performance. 2. It is recommended to drain the Pad between uses. To drain the pad, hold the Pad upright with the hose pointed toward the ground. Depress the black plunger and allow water  to drain out. 3. You may disconnect the Pad from the unit without removing the pad from the affected area by depressing the silver tabs on the hose coupling and gently pulling the hoses apart. The Pad and unit will seal itself and will not leak. Note: Some dripping during release is normal. 4. DO NOT RUN PUMP WITHOUT WATER ! The pump in this unit is designed to run with water . Running the unit without water  will cause permanent damage to the pump. 5. Unplug unit before removing lid.  TROUBLESHOOTING GUIDE Pump not running, Water  not flowing to the pad, Pad is not getting cold 1. Make sure the transformer is plugged into the wall outlet. 2. Confirm that the ice and water  are filled to the indicated levels. 3. Make sure there are no kinks in the pad. 4. Gently pull on the blue tube to make sure the tube/pad junction is straight. 5. Remove the pad from the treatment site and ll it while the pad is lying at; then reapply. 6. Confirm that the pad couplings are securely attached to the unit. Listen for the double clicks (Figure 1) to confirm the pad couplings are securely attached.  Leaks    Note: Some condensation on the lines, controller, and pads is unavoidable, especially in warmer climates. 1. If using a [employer] Polar Care Cold Therapy unit with a detachable Cold Therapy Pad, and a leak exists (other than condensation on the lines)  disconnect the pad couplings. Make sure the silver tabs on the couplings are depressed before reconnecting the pad to the pump hose; then confirm both sides of the coupling are properly clicked in. 2. If the coupling continues to leak or a leak is detected in the pad itself, stop using it and call [employer] Customer Care at [phone].  Cleaning After use, empty and dry the unit with a soft cloth. Warm water  and mild detergent may be used occasionally to clean the pump and tubes.  WARNING: The Polar Care Cube can be cold enough to cause serious injury, including full skin necrosis. Follow these Operating Instructions, and carefully read the Product Insert (see pouch on side of unit) and the Cold Therapy Pad Fitting Instructions (provided with each Cold Therapy Pad) prior to use.

## 2024-03-10 DIAGNOSIS — M1712 Unilateral primary osteoarthritis, left knee: Secondary | ICD-10-CM | POA: Diagnosis not present

## 2024-03-10 DIAGNOSIS — M25562 Pain in left knee: Secondary | ICD-10-CM | POA: Diagnosis not present

## 2024-03-10 DIAGNOSIS — R2681 Unsteadiness on feet: Secondary | ICD-10-CM | POA: Diagnosis not present

## 2024-03-12 DIAGNOSIS — M25562 Pain in left knee: Secondary | ICD-10-CM | POA: Diagnosis not present

## 2024-03-12 DIAGNOSIS — M1712 Unilateral primary osteoarthritis, left knee: Secondary | ICD-10-CM | POA: Diagnosis not present

## 2024-03-12 DIAGNOSIS — R2681 Unsteadiness on feet: Secondary | ICD-10-CM | POA: Diagnosis not present

## 2024-03-14 ENCOUNTER — Encounter: Payer: Self-pay | Admitting: Orthopedic Surgery

## 2024-03-14 NOTE — Progress Notes (Signed)
 Perioperative / Anesthesia Services  Pre-Admission Testing Clinical Review / Pre-Operative Anesthesia Consult  Date: 03/14/24  PATIENT DEMOGRAPHICS: Name: Alison Huffman DOB: [birth year] MRN:   [id number]  Note: Available PAT nursing documentation and vital signs have been reviewed. Clinical nursing staff has updated patient's PMH/PSHx, current medication list, and drug allergies/intolerances to ensure complete and comprehensive history available to assist care teams in MDM as it pertains to the aforementioned surgical procedure and anticipated anesthetic course. Extensive review of available clinical information personally performed. Prentiss PMH and PSHx updated with any diagnoses/procedures that  may have been inadvertently omitted during his intake with the pre-admission testing department's nursing staff.  PLANNED SURGICAL PROCEDURE(S):    Case: [id number] Date/Time: 03/17/24 1006   Procedure: ARTHROPLASTY, KNEE, TOTAL (Left: Knee)   Anesthesia type: Choice   Diagnosis: Primary osteoarthritis of left knee [M17.12]   Pre-op diagnosis: Primary osteoarthritis of left knee M17.12   Location: [facility] OR ROOM 02 / [facility]S FOR ANEST[facility]ESIA [facility]   Surgeons: Venus Ginsberg, Huffman     CLINICAL DISCUSSION: Alison Huffman is a 83 y.o. female who is submitted for pre-surgical anesthesia review and clearance prior to her undergoing the above procedure. Patient has never been a smoker in the past. Pertinent PMH includes: CAD, HFpEF, atrial tachycardia, second-degree AV block (s/p PPM placement), aortic atherosclerosis, atypical chest pain, RBBB, HTN, HLD, prediabetes, DOE, OSAH (does not utilize prescribed nocturnal PAP therapy), GERD (no daily Tx), Guillain-Barr, psoriasis/psoriatic arthritis, TMJ, OA.  Patient is followed by cardiology (Alison Huffman). She was last seen in the cardiology clinic on 10/08/2023; notes reviewed. At the time of her clinic visit, patient doing well overall from a  cardiovascular perspective.  Patient complained of rare palpitations and ongoing shortness of breath.  Overall, respiratory status reportedly stable and at baseline.  She denied any chest pain, PND, orthopnea, significant peripheral edema, weakness, fatigue, vertiginous symptoms, or presyncope/syncope.  Patient has a past medical history significant for cardiovascular diagnoses.  Documented physical exam was grossly benign, provide no evidence of acute exacerbation and/or decompensation of the patient's known cardiovascular conditions.  Patient with a history of a significant high-grade heart block (second-degree AVB).  She ultimately required placement of a implanted pacemaker on 07/23/2018; Alison Huffman (SN: [id number]) device was placed.  Device regularly interrogated by patient's primary electrophysiology team.  Most recent interrogation was on 01/09/2024 at which time device was noted to be functioning properly.  Most recent myocardial perfusion imaging study was performed on 10/24/2018 revealing a normal left ventricular systolic function with an EF of 57%.  There were no regional wall motion abnormalities.  No artifact or left ventricular cavity size enlargement appreciated on review of imaging. SPECT images demonstrated no evidence of stress-induced myocardial ischemia or arrhythmia; no scintigraphic evidence of scar.  TID ratio = 1.09. Study determined to be normal and low risk.  Coronary CTA was performed on 06/02/2021 that demonstrated a minimally elevated Agatston coronary artery calcium  score of 10.9. This placed patient in the 17th percentile for age, sex, and race matched controls. Calcium  depositions noted to be isolated mainly in the mid LAD (<25%) distribution.  Study demonstrates normal coronary origin with RIGHT dominance.  Blood pressure reasonably controlled at 134/64 mmHg on currently prescribed diuretic (furosemide ) and ARB (losartan ) therapies.  Patient is on rosuvastatin  for  her HLD diagnosis and ASCVD prevention.  Patient has a prediabetes diagnosis that she is managing with diet lifestyle modification.  Most recent hemoglobin A1c was 6.3% when  checked on 10/19/2023.  Patient does have an OSAH diagnosis, however she does not utilize the prescribed nocturnal PAP therapy.  Functional capacity somewhat limited by patient's age and multiple medical comorbidities.  With that said, patient is able to complete all of her ADLs/IADLs without significant cardiovascular limitation.  Per the DASI, patient is able to exceed 4 METS physical activity without experiencing any significant angina/anginal equivalent symptoms.  No changes were made to her medication regimen.  Patient sent for repeat echocardiogram to reassess cardiac/valvular function.  Patient follow-up with outpatient cardiology in 1 year or sooner if needed.  Since patient was last seen by cardiology, she has undergone the recommended noninvasive cardiovascular testing as recommended by her primary specially care team.  Most recent TTE performed on 10/24/2023 revealed a normal left ventricular systolic function with an EF of 55%.  There was septal dyskinesis associated with known bundle branch block.Left ventricular diastolic Doppler parameters consistent with abnormal relaxation (G1DD).  Right ventricular size and function was normal with a TAPSE measuring 1.9 cm  (normal range >/= 1.6 cm).  RVSP = 21.0 mmHg. left atrium was moderately dilated.  There was mild to moderate mitral, tricuspid, and aortic valve regurgitation.  Mild regurgitation of the pulmonary valve noted.  Aortic valve was noted to be mildly calcified. All transvalvular gradients were noted to be normal providing no evidence of hemodynamically significant valvular stenosis. Aorta normal in size with no evidence of ectasia or aneurysmal dilatation.  Alison Huffman is scheduled for an elective ARTHROPLASTY, KNEE, TOTAL (Left: Knee) on 03/17/2024 with Dr. Venus Ginsberg, Huffman. Given patient's past medical history significant for cardiovascular diagnoses, presurgical cardiac clearance was sought by the PAT team.  Per cardiology, patient's RCRI score 6.6%. The patient affirms she has been doing well without any new cardiac symptoms. They are able to achieve 5 METS without cardiac limitations. Therefore, based on ACC/AHA guidelines, the patient would be at ACCEPTABLE risk for the planned procedure without further cardiovascular testing.  In review of her medication reconciliation, the patient is not noted to be taking any type of anticoagulation or antiplatelet therapies that would need to be held during her perioperative course.  Patient denies previous perioperative complications with anesthesia in the past. In review her EMR, it is noted that patient underwent a general anesthetic course here at [facility] ([facility]) in 07/2019 without documented complications.   MOST RECENT VITAL SIGNS:    03/06/2024    8:46 AM 12/25/2023    1:11 PM 12/19/2023    8:12 AM  Vitals with BMI  Height 5' 6 5' 6 5' 6  Weight 181 lbs 181 lbs 13 oz 178 lbs  BMI 29.23 29.36 28.74  Systolic 141 128 --  Diastolic 61 82 --  Pulse 70 70    PROVIDERS/SPECIALISTS: NOTE: Primary physician provider listed below. Patient may have been seen by APP or partner within same practice.   PROVIDER ROLE / SPECIALTY LAST Florian Hurt, [facility] (Surgeon) 03/05/2024  Bluford Burkitt, NP Primary Care Provider 11/29/2023  Belva Boyden, [facility] 06/01/2023; preop APP call 02/21/2024  Harvie Liner, [facility] Electrophys[facility] 10/08/2023  Annitta Kindler, [facility] Pulmonary [facility] 12/25/2023   ALLERGIES: Allergies  Allergen Reactions   Enbrel [Etanercept] Other (See Comments)    Guillain Baree   Codeine Other (See Comments)    Stomach ache   Nickel    CURRENT HOME MEDICATIONS: No current facility-administered medications for this  encounter.    acetaminophen  (  TYLENOL ) 325 MG tablet   albuterol  (VENTOLIN  HFA) 108 (90 Base) MCG/ACT inhaler   budesonide -formoterol  (SYMBICORT ) 160-4.5 MCG/ACT inhaler   carboxymethylcellulose (REFRESH PLUS) 0.5 % SOLN   clobetasol  ointment (TEMOVATE ) 0.05 %   furosemide  (LASIX ) 20 MG tablet   losartan  (COZAAR ) 25 MG tablet   naproxen sodium (ALEVE) 220 MG tablet   rosuvastatin  (CRESTOR ) 20 MG tablet   tacrolimus  (PROTOPIC ) 0.1 % ointment   Vitamin D , Ergocalciferol , 50 MCG (2000 UT) CAPS   Spacer/Aero-Holding Chambers (AEROCHAMBER MV) inhaler   HISTORY: Past Medical History:  Diagnosis Date   (HFpEF) heart failure with preserved ejection fraction (HCC)    Aortic atherosclerosis (HCC)    Atrial tachycardia (HCC)    Atypical chest pain    a. 10/2018 MV: EF 57%. No ischemia/infarct.   Cholelithiasis    Coronary artery disease    Dyspnea    GERD (gastroesophageal reflux disease)    Guillain-Barre (HCC)    Headache    Hyperlipidemia    Hypertension    Inflammatory neuropathy (HCC)    Joint pain in fingers of right hand    Lichen sclerosus    Osteopenia    Pneumonia 2012? 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   Psoriasis    Psoriatic arthritis (HCC)    RBBB (right bundle branch block)    Rosacea    Seborrheic keratosis    Second degree heart block    a.) s/p SJM PPM placement 07/23/2018   Sleep apnea    a.) does not utilize prescribed nocturnal PAP therapy   TMJ (dislocation of temporomandibular joint)    Unilateral primary osteoarthritis, left knee    Past Surgical History:  Procedure Laterality Date   APPENDECTOMY     BREAST CYST ASPIRATION Bilateral    BREAST CYST EXCISION Right 1978   benign   COLONOSCOPY     COLONOSCOPY WITH PROPOFOL  N/A 07/10/2019   Procedure: COLONOSCOPY WITH PROPOFOL ;  Surgeon: Deveron Fly, Huffman;  Location: [facility] ENDOSCOPY;  Service: Endoscopy;   Laterality: N/A;   ESOPHAGOGASTRODUODENOSCOPY (EGD) WITH PROPOFOL  N/A 07/10/2019   Procedure: ESOPHAGOGASTRODUODENOSCOPY (EGD) WITH PROPOFOL ;  Surgeon: Deveron Fly, Huffman;  Location: [facility] ENDOSCOPY;  Service: Endoscopy;  Laterality: N/A;   HYSTEROSCOPY WITH D & C N/A 11/05/2018   Procedure: DILATATION AND CURETTAGE /HYSTEROSCOPY;  Surgeon: Heron Lord, Huffman;  Location: [facility] ORS;  Service: Gynecology;  Laterality: N/A;   INSERT / REPLACE / REMOVE PACEMAKER     PACEMAKER IMPLANT N/A 07/23/2018   SJM Huffman [id number] implanted by Dr Nunzio Belch for mobitz II second degree AV block   TOOTH EXTRACTION  12/05/2023   UTERINE POLYPS REMOVAL     Family History  Problem Relation Age of Onset   Stroke Mother    Atrial fibrillation Mother    Breast cancer Mother    Stroke Father    Breast cancer Maternal Aunt    Breast cancer Paternal Aunt    Social History   Tobacco Use   Smoking status: Never   Smokeless tobacco: Never   Tobacco comments:    07/22/2018 smoked some in college; 1960s;  nothing since  Substance Use Topics   Alcohol use: Yes    Comment: rarely   LABS:  Hospital Outpatient Visit on 03/06/2024  Component Date Value Ref Range Status   MRSA, PCR 03/06/2024 NEGATIVE  NEGATIVE Final   Staphylococcus aureus 03/06/2024 NEGATIVE  NEGATIVE Final   Comment: (NOTE) The Xpert SA Assay (FDA approved for NASAL specimens in patients 75 years of age and older), is one component of a comprehensive surveillance program. It is not intended to diagnose infection nor to guide or monitor treatment. Performed at [facility], [address] [place], [place] [address]    WBC 03/06/2024 5.3  4.0 - 10.5 K/uL Final   RBC 03/06/2024 4.00  3.87 - 5.11 MIL/uL Final   Hemoglobin 03/06/2024 12.4  12.0 - 15.0 g/dL Final   HCT 60/45/4098 38.3  36.0 - 46.0 % Final   MCV 03/06/2024 95.8  80.0 - 100.0 fL Final   MCH 03/06/2024 31.0  26.0 - 34.0 pg Final   MCHC 03/06/2024 32.4  30.0 -  36.0 g/dL Final   RDW 11/91/4782 12.9  11.5 - 15.5 % Final   Platelets 03/06/2024 201  150 - 400 K/uL Final   nRBC 03/06/2024 0.0  0.0 - 0.2 % Final   Neutrophils Relative % 03/06/2024 61  % Final   Neutro Abs 03/06/2024 3.2  1.7 - 7.7 K/uL Final   Lymphocytes Relative 03/06/2024 26  % Final   Lymphs Abs 03/06/2024 1.4  0.7 - 4.0 K/uL Final   Monocytes Relative 03/06/2024 9  % Final   Monocytes Absolute 03/06/2024 0.5  0.1 - 1.0 K/uL Final   Eosinophils Relative 03/06/2024 3  % Final   Eosinophils Absolute 03/06/2024 0.2  0.0 - 0.5 K/uL Final   Basophils Relative 03/06/2024 1  % Final   Basophils Absolute 03/06/2024 0.0  0.0 - 0.1 K/uL Final   Immature Granulocytes 03/06/2024 0  % Final   Abs Immature Granulocytes 03/06/2024 0.01  0.00 - 0.07 K/uL Final   Performed at [facility], [address] [place], [place] [address]   Sodium 03/06/2024 137  135 - 145 mmol/L Final   Potassium 03/06/2024 3.8  3.5 - 5.1 mmol/L Final   Chloride 03/06/2024 105  98 - 111 mmol/L Final   CO2 03/06/2024 26  22 - 32 mmol/L Final   Glucose, Bld 03/06/2024 91  70 - 99 mg/dL Final   Glucose reference range applies only to samples taken after fasting for at least 8 hours.   BUN 03/06/2024 14  8 - 23 mg/dL Final   Creatinine, Ser 03/06/2024 0.56  0.44 - 1.00 mg/dL Final   Calcium  03/06/2024 9.0  8.9 - 10.3 mg/dL Final   Total Protein 30/86/5784 7.0  6.5 - 8.1 g/dL Final   Albumin  03/06/2024 4.1  3.5 - 5.0 g/dL Final   AST 69/62/9528 23  15 - 41 U/L Final   ALT 03/06/2024 22  0 - 44 U/L Final   Alkaline Phosphatase 03/06/2024 74  38 - 126 U/L Final   Total Bilirubin 03/06/2024 0.7  0.0 - 1.2 mg/dL Final   GFR, Estimated 03/06/2024 >60  >60 mL/min Final   Comment: (NOTE) Calculated using the CKD-EPI Creatinine Equation (2021)    Anion gap 03/06/2024 6  5 - 15 Final   Performed at [facility], [address] [place], [place] [address]   Color, Urine 03/06/2024 STRAW (A)  YELLOW Final    APPearance 03/06/2024 CLEAR (A)  CLEAR Final   Specific Gravity, Urine 03/06/2024 1.004 (L)  1.005 - 1.030 Final   pH 03/06/2024 6.0  5.0 - 8.0 Final   Glucose, UA 03/06/2024 NEGATIVE  NEGATIVE mg/dL Final   Hgb urine dipstick 03/06/2024 NEGATIVE  NEGATIVE Final   Bilirubin Urine  03/06/2024 NEGATIVE  NEGATIVE Final   Ketones, ur 03/06/2024 NEGATIVE  NEGATIVE mg/dL Final   Protein, ur 08/65/7846 NEGATIVE  NEGATIVE mg/dL Final   Nitrite 96/29/5284 NEGATIVE  NEGATIVE Final   Leukocytes,Ua 03/06/2024 NEGATIVE  NEGATIVE Final   Performed at [facility], [address] [place], [place] [address]    ECG: Date: 03/06/2024  Time ECG obtained: 0954 AM Rate: 64 bpm Rhythm: Atrial sensed ventricular paced rhythm with prolonged AV conduction Axis (leads I and aVF): normal Intervals: PR 238 ms. QRS 178 ms. QTc 509 ms. ST segment and T wave changes: No evidence of acute T wave abnormalities or significant ST segment elevation or depression.  Evidence of a possible, age undetermined, prior infarct:  No Comparison: Similar to previous tracing obtained on 10/08/2023   IMAGING / PROCEDURES: DIAGNOSTIC RADIOGRAPHS OF LEFT KNEE 4+ VIEWS performed on 10/31/2023 Complete loss  of joint space in the medial apartment left knee with slight varus deformity.   Patellofemoral osteoarthritis noted.   Normal tracking of the patella trochlear groove.   Mid PA flexion view shows continued complete loss of joint space in the mid compartment with varus deformity.   No evidence of acute bony abnormality or effusion   TRANSTHORACIC ECHOCARDIOGRAM performed on 10/24/2023 Left ventricular ejection fraction, by estimation, is 55 %. The left ventricle has normal function. The left ventricle demonstrates dyskinesis and regional wall motion abnormalities (septal wall motion consistent with bundle branch block). Left ventricular diastolic parameters are consistent with Grade I diastolic dysfunction (impaired  relaxation). Right ventricular systolic function is normal. The right ventricular size is normal. There is normal pulmonary artery systolic pressure. The estimated right ventricular systolic pressure is 21.0 mmHg.  Left atrial size was moderately dilated.  The mitral valve is normal in structure. Mild to moderate mitral valve regurgitation. No evidence of mitral stenosis.  Tricuspid valve regurgitation is mild to moderate. The aortic valve is normal in structure. There is mild calcification of the aortic valve. Aortic valve regurgitation is mild to moderate. Aortic valve sclerosis is present, with no evidence of aortic valve stenosis.  The inferior vena cava is normal in size with greater than 50% respiratory variability, suggesting right atrial pressure of 3 mmHg.   CT CHEST HIGH RESOLUTION performed on 01/08/2023 Minimal left perihilar and basilar subpleural ground-glass with traction bronchiectasis, findings which may represent the postinflammatory residua of COVID-19 pneumonia. Findings are suggestive of an alternative diagnosis (not UIP) per consensus guidelines. Mild air trapping can be seen in setting of small airways disease. Cholelithiasis. Aortic atherosclerosis  Coronary artery calcification.  PULMONARY FUNCTION TESTING performed on 12/26/2022    Latest Ref Rng & Units 12/26/2022    9:41 AM 02/22/2022    2:36 PM  PFT Results  FVC-Pre L 1.99  2.42   FVC-Predicted Pre % 73  87   FVC-Post L 2.25  2.36   FVC-Predicted Post % 82  85   Pre FEV1/FVC % % 95  87   Post FEV1/FCV % % 85  91   FEV1-Pre L 1.90  2.10   FEV1-Predicted Pre % 93  101   FEV1-Post L 1.92  2.14   DLCO uncorrected ml/min/mmHg 14.23  13.36   DLCO UNC% % 71  66   DLVA Predicted % 87  89   TLC L 4.56  3.93   TLC % Predicted % 85  73   RV % Predicted % 81  60    CT CORONARY MORPH W/CTA COR W/SCORE W/CA  W/CM &/OR WO/CM performed on 06/02/2021 Coronary calcium  score of 10.9. This was 17th percentile for age and  sex matched control. Normal coronary origin with right dominance. Minimal non obstructive mid LAD disease (<25%). CAD-RADS 1. Minimal non-obstructive CAD (0-24%). Consider non-atherosclerotic causes of chest pain. Consider preventive therapy and risk factor modification.  NM MYOCAR MULTI W/SPECT W/WALL MOTION / EF performed on 10/24/2018 Normal left ventricular systolic function with an EF of 57% No regional wall motion abnormalities No EKG changes concerning for ischemia at peak stress or in recovery Rare PVCs noted On neurological myocardial perfusion imaging study with no evidence of significant ischemia Overall low risk study  IMPRESSION AND PLAN: Alison Huffman has been referred for pre-anesthesia review and clearance prior to her undergoing the planned anesthetic and procedural courses. Available labs, pertinent testing, and imaging results were personally reviewed by me in preparation for upcoming operative/procedural course. [facility] medical record has been updated following extensive record review and patient interview with PAT staff.   This patient has been appropriately cleared by cardiology with an overall ACCEPTABLE risk of patient experiencing significant perioperative cardiovascular complications. Based on clinical review performed today (03/14/24), barring any significant acute changes in the patient's overall condition, it is anticipated that she will be able to proceed with the planned surgical intervention. Any acute changes in clinical condition may necessitate her procedure being postponed and/or cancelled. Patient will meet with anesthesia team (Huffman and/or CRNA) on the day of her procedure for preoperative evaluation/assessment. Questions regarding anesthetic course will be fielded at that time.   Pre-surgical instructions were reviewed with the patient during his PAT appointment, and questions were fielded to satisfaction by PAT clinical staff. She has been instructed on  which medications that she will need to hold prior to surgery, as well as the ones that have been deemed safe/appropriate to take on the day of her procedure. As part of the general education provided by PAT, patient made aware both verbally and in writing, that she would need to abstain from the use of any illegal substances during her perioperative course. She was advised that failure to follow the provided instructions could necessitate case cancellation or result in serious perioperative complications up to and including death. Patient encouraged to contact PAT and/or her surgeon's office to discuss any questions or concerns that may arise prior to surgery; verbalized understanding.   Renate Caroline, MSN, APRN, FNP-C, CEN [facility]  Perioperative Services Nurse Practitioner Phone: [phone] Fax: [phone] 03/14/24 2:57 PM  NOTE: This note has been prepared using Dragon dictation software. Despite my best ability to proofread, there is always the potential that unintentional transcriptional errors may still occur from this process.

## 2024-03-17 ENCOUNTER — Encounter: Payer: Self-pay | Admitting: Orthopedic Surgery

## 2024-03-17 ENCOUNTER — Ambulatory Visit
Admission: RE | Admit: 2024-03-17 | Discharge: 2024-03-18 | Disposition: A | Discharge status: Skilled Nursing Facility | Source: Ambulatory Visit | Attending: Orthopedic Surgery | Admitting: Orthopedic Surgery

## 2024-03-17 ENCOUNTER — Other Ambulatory Visit: Payer: Self-pay

## 2024-03-17 ENCOUNTER — Ambulatory Visit: Payer: Self-pay | Admitting: Urgent Care

## 2024-03-17 ENCOUNTER — Ambulatory Visit: Admitting: Podiatry

## 2024-03-17 ENCOUNTER — Encounter
Admission: RE | Disposition: A | Discharge status: Skilled Nursing Facility | Payer: Self-pay | Source: Ambulatory Visit | Attending: Orthopedic Surgery

## 2024-03-17 ENCOUNTER — Ambulatory Visit

## 2024-03-17 DIAGNOSIS — E785 Hyperlipidemia, unspecified: Secondary | ICD-10-CM | POA: Diagnosis not present

## 2024-03-17 DIAGNOSIS — M25762 Osteophyte, left knee: Secondary | ICD-10-CM | POA: Diagnosis not present

## 2024-03-17 DIAGNOSIS — M1712 Unilateral primary osteoarthritis, left knee: Secondary | ICD-10-CM | POA: Insufficient documentation

## 2024-03-17 DIAGNOSIS — R7303 Prediabetes: Secondary | ICD-10-CM | POA: Diagnosis not present

## 2024-03-17 DIAGNOSIS — G4733 Obstructive sleep apnea (adult) (pediatric): Secondary | ICD-10-CM | POA: Insufficient documentation

## 2024-03-17 DIAGNOSIS — I5032 Chronic diastolic (congestive) heart failure: Secondary | ICD-10-CM | POA: Diagnosis not present

## 2024-03-17 DIAGNOSIS — Z96652 Presence of left artificial knee joint: Secondary | ICD-10-CM

## 2024-03-17 DIAGNOSIS — M85862 Other specified disorders of bone density and structure, left lower leg: Secondary | ICD-10-CM | POA: Diagnosis not present

## 2024-03-17 DIAGNOSIS — I110 Hypertensive heart disease with heart failure: Secondary | ICD-10-CM | POA: Diagnosis not present

## 2024-03-17 DIAGNOSIS — L4050 Arthropathic psoriasis, unspecified: Secondary | ICD-10-CM | POA: Insufficient documentation

## 2024-03-17 DIAGNOSIS — I2510 Atherosclerotic heart disease of native coronary artery without angina pectoris: Secondary | ICD-10-CM | POA: Insufficient documentation

## 2024-03-17 DIAGNOSIS — Z01812 Encounter for preprocedural laboratory examination: Secondary | ICD-10-CM

## 2024-03-17 DIAGNOSIS — K219 Gastro-esophageal reflux disease without esophagitis: Secondary | ICD-10-CM | POA: Insufficient documentation

## 2024-03-17 DIAGNOSIS — Z950 Presence of cardiac pacemaker: Secondary | ICD-10-CM | POA: Diagnosis not present

## 2024-03-17 DIAGNOSIS — E119 Type 2 diabetes mellitus without complications: Secondary | ICD-10-CM

## 2024-03-17 HISTORY — DX: Atherosclerosis of aorta: I70.0

## 2024-03-17 HISTORY — DX: Other supraventricular tachycardia: I47.19

## 2024-03-17 HISTORY — DX: Unspecified right bundle-branch block: I45.10

## 2024-03-17 HISTORY — DX: Unspecified diastolic (congestive) heart failure: I50.30

## 2024-03-17 HISTORY — PX: TOTAL KNEE ARTHROPLASTY: SHX125

## 2024-03-17 HISTORY — DX: Calculus of gallbladder without cholecystitis without obstruction: K80.20

## 2024-03-17 LAB — GLUCOSE, CAPILLARY: Glucose-Capillary: 85 mg/dL (ref 70–99)

## 2024-03-17 SURGERY — ARTHROPLASTY, KNEE, TOTAL
Anesthesia: Spinal | Site: Knee | Laterality: Left

## 2024-03-17 MED ORDER — CEFAZOLIN SODIUM-DEXTROSE 2-4 GM/100ML-% IV SOLN
INTRAVENOUS | Status: AC
  Filled 2024-03-17: qty 100

## 2024-03-17 MED ORDER — TRAMADOL HCL 50 MG PO TABS: 50.0000 mg | ORAL_TABLET | Freq: Four times a day (QID) | ORAL | Status: DC | PRN

## 2024-03-17 MED ORDER — ORAL CARE MOUTH RINSE: 15.0000 mL | Freq: Once | OROMUCOSAL | Status: AC

## 2024-03-17 MED ORDER — DOCUSATE SODIUM 100 MG PO CAPS
100.0000 mg | ORAL_CAPSULE | Freq: Two times a day (BID) | ORAL | Status: DC
  Administered 2024-03-17 – 2024-03-18 (×2): 100 mg via ORAL
  Filled 2024-03-17: qty 1

## 2024-03-17 MED ORDER — SODIUM CHLORIDE 0.9 % IV SOLN: INTRAVENOUS | Status: DC

## 2024-03-17 MED ORDER — ONDANSETRON HCL 4 MG PO TABS: 4.0000 mg | ORAL_TABLET | Freq: Four times a day (QID) | ORAL | 0 refills | Status: DC | PRN

## 2024-03-17 MED ORDER — CHLORHEXIDINE GLUCONATE 0.12 % MT SOLN
15.0000 mL | Freq: Once | OROMUCOSAL | Status: AC
  Administered 2024-03-17: 15 mL via OROMUCOSAL

## 2024-03-17 MED ORDER — BUPIVACAINE LIPOSOME 1.3 % IJ SUSP
INTRAMUSCULAR | Status: AC
  Filled 2024-03-17: qty 20

## 2024-03-17 MED ORDER — TRANEXAMIC ACID-NACL 1000-0.7 MG/100ML-% IV SOLN: 1000.0000 mg | INTRAVENOUS | Status: AC

## 2024-03-17 MED ORDER — HYDROCODONE-ACETAMINOPHEN 5-325 MG PO TABS
1.0000 | ORAL_TABLET | ORAL | Status: DC | PRN
  Administered 2024-03-17 – 2024-03-18 (×2): 2 via ORAL
  Filled 2024-03-17 (×2): qty 2

## 2024-03-17 MED ORDER — DEXAMETHASONE SODIUM PHOSPHATE 10 MG/ML IJ SOLN: 8.0000 mg | Freq: Once | INTRAMUSCULAR | Status: AC

## 2024-03-17 MED ORDER — OXYCODONE HCL 5 MG PO TABS
5.0000 mg | ORAL_TABLET | Freq: Once | ORAL | Status: AC | PRN
  Administered 2024-03-17: 5 mg via ORAL

## 2024-03-17 MED ORDER — KETOROLAC TROMETHAMINE 15 MG/ML IJ SOLN
7.5000 mg | Freq: Four times a day (QID) | INTRAMUSCULAR | Status: AC
  Administered 2024-03-17 – 2024-03-18 (×4): 7.5 mg via INTRAVENOUS
  Filled 2024-03-17 (×4): qty 1

## 2024-03-17 MED ORDER — MENTHOL 3 MG MT LOZG: 1.0000 | LOZENGE | OROMUCOSAL | Status: DC | PRN

## 2024-03-17 MED ORDER — FENTANYL CITRATE (PF) 100 MCG/2ML IJ SOLN: INTRAMUSCULAR | Status: DC | PRN

## 2024-03-17 MED ORDER — CEFAZOLIN SODIUM-DEXTROSE 2-4 GM/100ML-% IV SOLN
2.0000 g | Freq: Four times a day (QID) | INTRAVENOUS | Status: AC
  Administered 2024-03-17 – 2024-03-18 (×2): 2 g via INTRAVENOUS
  Filled 2024-03-17 (×2): qty 100

## 2024-03-17 MED ORDER — ACETAMINOPHEN 500 MG PO TABS: 1000.0000 mg | ORAL_TABLET | Freq: Three times a day (TID) | ORAL | 0 refills | Status: AC

## 2024-03-17 MED ORDER — PROPOFOL 10 MG/ML IV BOLUS: INTRAVENOUS | Status: DC | PRN

## 2024-03-17 MED ORDER — BUPIVACAINE-EPINEPHRINE (PF) 0.25% -1:200000 IJ SOLN
INTRAMUSCULAR | Status: AC
  Filled 2024-03-17: qty 30

## 2024-03-17 MED ORDER — FENTANYL CITRATE (PF) 100 MCG/2ML IJ SOLN: 25.0000 ug | INTRAMUSCULAR | Status: DC | PRN

## 2024-03-17 MED ORDER — LOSARTAN POTASSIUM 50 MG PO TABS
25.0000 mg | ORAL_TABLET | Freq: Every day | ORAL | Status: DC
  Administered 2024-03-17 – 2024-03-18 (×2): 25 mg via ORAL
  Filled 2024-03-17 (×2): qty 1

## 2024-03-17 MED ORDER — CELECOXIB 200 MG PO CAPS
200.0000 mg | ORAL_CAPSULE | Freq: Two times a day (BID) | ORAL | 0 refills | Status: DC
Start: 2024-03-17 — End: 2024-03-18

## 2024-03-17 MED ORDER — OXYCODONE HCL 5 MG/5ML PO SOLN: 5.0000 mg | Freq: Once | ORAL | Status: AC | PRN

## 2024-03-17 MED ORDER — PANTOPRAZOLE SODIUM 40 MG PO TBEC
40.0000 mg | DELAYED_RELEASE_TABLET | Freq: Every day | ORAL | Status: DC
  Administered 2024-03-18: 40 mg via ORAL
  Filled 2024-03-17 (×2): qty 1

## 2024-03-17 MED ORDER — SODIUM CHLORIDE (PF) 0.9 % IJ SOLN
INTRAMUSCULAR | Status: AC
  Filled 2024-03-17: qty 20

## 2024-03-17 MED ORDER — ENOXAPARIN SODIUM 40 MG/0.4ML IJ SOSY: 40.0000 mg | PREFILLED_SYRINGE | INTRAMUSCULAR | 0 refills | Status: DC

## 2024-03-17 MED ORDER — CEFAZOLIN SODIUM-DEXTROSE 2-4 GM/100ML-% IV SOLN: 2.0000 g | INTRAVENOUS | Status: AC

## 2024-03-17 MED ORDER — SODIUM CHLORIDE 0.9 % IR SOLN
Status: DC | PRN
  Administered 2024-03-17: 3000 mL

## 2024-03-17 MED ORDER — TRANEXAMIC ACID-NACL 1000-0.7 MG/100ML-% IV SOLN
INTRAVENOUS | Status: AC
  Filled 2024-03-17: qty 100

## 2024-03-17 MED ORDER — POLYVINYL ALCOHOL 1.4 % OP SOLN: 2.0000 [drp] | OPHTHALMIC | Status: DC | PRN

## 2024-03-17 MED ORDER — BUPIVACAINE HCL (PF) 0.5 % IJ SOLN: INTRAMUSCULAR | Status: DC | PRN

## 2024-03-17 MED ORDER — PROPOFOL 10 MG/ML IV BOLUS
INTRAVENOUS | Status: AC
  Filled 2024-03-17: qty 20

## 2024-03-17 MED ORDER — PHENOL 1.4 % MT LIQD: 1.0000 | OROMUCOSAL | Status: DC | PRN

## 2024-03-17 MED ORDER — ACETAMINOPHEN 10 MG/ML IV SOLN: INTRAVENOUS | Status: DC | PRN

## 2024-03-17 MED ORDER — METOCLOPRAMIDE HCL 5 MG/ML IJ SOLN: 5.0000 mg | Freq: Three times a day (TID) | INTRAMUSCULAR | Status: DC | PRN

## 2024-03-17 MED ORDER — ALBUTEROL SULFATE (2.5 MG/3ML) 0.083% IN NEBU: 3.0000 mL | INHALATION_SOLUTION | Freq: Four times a day (QID) | RESPIRATORY_TRACT | Status: DC | PRN

## 2024-03-17 MED ORDER — SODIUM CHLORIDE (PF) 0.9 % IJ SOLN
INTRAMUSCULAR | Status: DC | PRN
  Administered 2024-03-17: 71 mL

## 2024-03-17 MED ORDER — ONDANSETRON HCL 4 MG/2ML IJ SOLN: 4.0000 mg | Freq: Four times a day (QID) | INTRAMUSCULAR | Status: DC | PRN

## 2024-03-17 MED ORDER — METOCLOPRAMIDE HCL 10 MG PO TABS: 5.0000 mg | ORAL_TABLET | Freq: Three times a day (TID) | ORAL | Status: DC | PRN

## 2024-03-17 MED ORDER — ONDANSETRON HCL 4 MG/2ML IJ SOLN: INTRAMUSCULAR | Status: DC | PRN

## 2024-03-17 MED ORDER — CHLORHEXIDINE GLUCONATE 0.12 % MT SOLN
OROMUCOSAL | Status: AC
Start: 2024-03-17 — End: 2024-03-17
  Filled 2024-03-17: qty 15

## 2024-03-17 MED ORDER — TRAMADOL HCL 50 MG PO TABS: 50.0000 mg | ORAL_TABLET | Freq: Four times a day (QID) | ORAL | 0 refills | Status: DC | PRN

## 2024-03-17 MED ORDER — FLUTICASONE FUROATE-VILANTEROL 200-25 MCG/ACT IN AEPB
1.0000 | INHALATION_SPRAY | Freq: Every day | RESPIRATORY_TRACT | Status: DC
  Administered 2024-03-18: 1 via RESPIRATORY_TRACT
  Filled 2024-03-17: qty 28

## 2024-03-17 MED ORDER — ROSUVASTATIN CALCIUM 10 MG PO TABS
20.0000 mg | ORAL_TABLET | Freq: Every evening | ORAL | Status: DC
  Administered 2024-03-17: 20 mg via ORAL
  Filled 2024-03-17: qty 2

## 2024-03-17 MED ORDER — OXYCODONE HCL 5 MG PO TABS: 2.5000 mg | ORAL_TABLET | Freq: Three times a day (TID) | ORAL | 0 refills | Status: DC | PRN

## 2024-03-17 MED ORDER — OXYCODONE HCL 5 MG PO TABS
ORAL_TABLET | ORAL | Status: AC
  Filled 2024-03-17: qty 1

## 2024-03-17 MED ORDER — ACETAMINOPHEN 500 MG PO TABS
1000.0000 mg | ORAL_TABLET | Freq: Three times a day (TID) | ORAL | Status: DC
  Administered 2024-03-18: 1000 mg via ORAL
  Filled 2024-03-17: qty 2

## 2024-03-17 MED ORDER — KETOROLAC TROMETHAMINE 30 MG/ML IJ SOLN
INTRAMUSCULAR | Status: AC
  Filled 2024-03-17: qty 1

## 2024-03-17 MED ORDER — ONDANSETRON HCL 4 MG/2ML IJ SOLN
INTRAMUSCULAR | Status: AC
  Filled 2024-03-17: qty 2

## 2024-03-17 MED ORDER — DEXAMETHASONE SODIUM PHOSPHATE 10 MG/ML IJ SOLN
INTRAMUSCULAR | Status: AC
  Filled 2024-03-17: qty 1

## 2024-03-17 MED ORDER — PHENYLEPHRINE HCL-NACL 20-0.9 MG/250ML-% IV SOLN: INTRAVENOUS | Status: DC | PRN

## 2024-03-17 MED ORDER — ENOXAPARIN SODIUM 30 MG/0.3ML IJ SOSY
30.0000 mg | PREFILLED_SYRINGE | Freq: Two times a day (BID) | INTRAMUSCULAR | Status: DC
  Administered 2024-03-18: 30 mg via SUBCUTANEOUS
  Filled 2024-03-17: qty 0.3

## 2024-03-17 MED ORDER — FENTANYL CITRATE (PF) 100 MCG/2ML IJ SOLN
INTRAMUSCULAR | Status: AC
  Filled 2024-03-17: qty 2

## 2024-03-17 MED ORDER — ACETAMINOPHEN 325 MG PO TABS: 325.0000 mg | ORAL_TABLET | Freq: Four times a day (QID) | ORAL | Status: DC | PRN

## 2024-03-17 MED ORDER — ONDANSETRON HCL 4 MG PO TABS: 4.0000 mg | ORAL_TABLET | Freq: Four times a day (QID) | ORAL | Status: DC | PRN

## 2024-03-17 MED ORDER — PROPOFOL 1000 MG/100ML IV EMUL
INTRAVENOUS | Status: AC
  Filled 2024-03-17: qty 100

## 2024-03-17 MED ORDER — ACETAMINOPHEN 10 MG/ML IV SOLN
INTRAVENOUS | Status: AC
  Filled 2024-03-17: qty 100

## 2024-03-17 MED ORDER — CARBOXYMETHYLCELLULOSE SODIUM 0.5 % OP SOLN: 1.0000 [drp] | Freq: Two times a day (BID) | OPHTHALMIC | Status: DC | PRN

## 2024-03-17 MED ORDER — DOCUSATE SODIUM 100 MG PO CAPS: 100.0000 mg | ORAL_CAPSULE | Freq: Two times a day (BID) | ORAL | 0 refills | Status: DC

## 2024-03-17 MED ORDER — SURGIPHOR WOUND IRRIGATION SYSTEM - OPTIME: TOPICAL | Status: DC | PRN

## 2024-03-17 MED ORDER — MORPHINE SULFATE (PF) 2 MG/ML IV SOLN: 0.5000 mg | INTRAVENOUS | Status: DC | PRN

## 2024-03-17 SURGICAL SUPPLY — 60 items
BLADE PATELLA REAM PILOT HOLE (MISCELLANEOUS) IMPLANT
BLADE SAW 90X13X1.19 OSCILLAT (BLADE) IMPLANT
BLADE SAW SAG 25X90X1.19 (BLADE) ×1 IMPLANT
BLADE SAW SAG 29X58X.64 (BLADE) ×1 IMPLANT
BNDG ELASTIC 6INX 5YD STR LF (GAUZE/BANDAGES/DRESSINGS) ×1 IMPLANT
BOWL CEMENT MIX W/ADAPTER (MISCELLANEOUS) IMPLANT
BRUSH SCRUB EZ PLAIN DRY (MISCELLANEOUS) IMPLANT
CEMENT BONE R 1X40 (Cement) IMPLANT
CHLORAPREP W/TINT 26 (MISCELLANEOUS) ×2 IMPLANT
COMP FEM PS KNEE 10 LT (Joint) IMPLANT
COOLER ICEMAN CLASSIC (MISCELLANEOUS) ×1 IMPLANT
CUFF TRNQT CYL 24X4X16.5-23 (TOURNIQUET CUFF) IMPLANT
CUFF TRNQT CYL 30X4X21-28X (TOURNIQUET CUFF) IMPLANT
DERMABOND ADVANCED .7 DNX12 (GAUZE/BANDAGES/DRESSINGS) ×1 IMPLANT
DRAPE SHEET LG 3/4 BI-LAMINATE (DRAPES) ×2 IMPLANT
DRSG MEPILEX SACRM 8.7X9.8 (GAUZE/BANDAGES/DRESSINGS) ×1 IMPLANT
DRSG OPSITE POSTOP 4X10 (GAUZE/BANDAGES/DRESSINGS) IMPLANT
DRSG OPSITE POSTOP 4X8 (GAUZE/BANDAGES/DRESSINGS) IMPLANT
ELECTRODE REM PT RTRN 9FT ADLT (ELECTROSURGICAL) ×1 IMPLANT
GLOVE BIO SURGEON STRL SZ8 (GLOVE) ×1 IMPLANT
GLOVE BIOGEL PI IND STRL 8 (GLOVE) ×1 IMPLANT
GLOVE PI ORTHO PRO STRL 7.5 (GLOVE) ×2 IMPLANT
GLOVE PI ORTHO PRO STRL SZ8 (GLOVE) ×2 IMPLANT
GLOVE SURG SYN 7.5 E (GLOVE) ×1 IMPLANT
GLOVE SURG SYN 7.5 PF PI (GLOVE) ×1 IMPLANT
GOWN SRG XL LVL 3 NONREINFORCE (GOWNS) ×1 IMPLANT
GOWN STRL REUS W/ TWL LRG LVL3 (GOWN DISPOSABLE) ×1 IMPLANT
GOWN STRL REUS W/ TWL XL LVL3 (GOWN DISPOSABLE) ×1 IMPLANT
HOOD PEEL AWAY T7 (MISCELLANEOUS) ×2 IMPLANT
INSERT MED AS PERS SZ 8-11 LT (Insert) IMPLANT
KIT TURNOVER KIT A (KITS) ×1 IMPLANT
MANIFOLD NEPTUNE II (INSTRUMENTS) ×1 IMPLANT
MARKER SKIN DUAL TIP RULER LAB (MISCELLANEOUS) ×1 IMPLANT
MAT ABSORB FLUID 56X50 GRAY (MISCELLANEOUS) ×1 IMPLANT
NDL HYPO 21X1.5 SAFETY (NEEDLE) ×1 IMPLANT
NEEDLE HYPO 21X1.5 SAFETY (NEEDLE) ×1 IMPLANT
PACK TOTAL KNEE (MISCELLANEOUS) ×1 IMPLANT
PAD ARMBOARD POSITIONER FOAM (MISCELLANEOUS) ×3 IMPLANT
PAD COLD UNI WRAP-ON (PAD) ×1 IMPLANT
PENCIL SMOKE EVACUATOR (MISCELLANEOUS) ×1 IMPLANT
PIN DRILL HDLS TROCAR 75 4PK (PIN) IMPLANT
SCREW FEMALE HEX FIX 25X2.5 (ORTHOPEDIC DISPOSABLE SUPPLIES) IMPLANT
SCREW HEX HEADED 3.5X27 DISP (ORTHOPEDIC DISPOSABLE SUPPLIES) IMPLANT
SLEEVE SCD COMPRESS KNEE MED (STOCKING) ×1 IMPLANT
SOL .9 NS 3000ML IRR UROMATIC (IV SOLUTION) ×1 IMPLANT
SOLUTION IRRIG SURGIPHOR (IV SOLUTION) ×1 IMPLANT
STEM POLY PAT PLY 35M KNEE (Knees) IMPLANT
STEM TIB ST PERS 14+30 (Stem) IMPLANT
STEM TIBIA 5 DEG SZ E L KNEE (Knees) IMPLANT
STOCKINETTE IMPERV 14X48 (MISCELLANEOUS) ×1 IMPLANT
SUT STRATAFIX 14 PDO 36 VLT (SUTURE) ×1 IMPLANT
SUT VIC AB 0 CT1 36 (SUTURE) ×1 IMPLANT
SUT VIC AB 2-0 CT2 27 (SUTURE) ×2 IMPLANT
SUTURE STRATA SPIR 4-0 18 (SUTURE) ×1 IMPLANT
SUTURE VICRYL 1-0 27IN ABS (SUTURE) ×1 IMPLANT
SYR 20ML LL LF (SYRINGE) ×2 IMPLANT
TAPE CLOTH 3X10 WHT NS LF (GAUZE/BANDAGES/DRESSINGS) ×1 IMPLANT
TIP FAN IRRIG PULSAVAC PLUS (DISPOSABLE) ×1 IMPLANT
TRAP FLUID SMOKE EVACUATOR (MISCELLANEOUS) ×1 IMPLANT
WATER STERILE IRR 1000ML POUR (IV SOLUTION) ×1 IMPLANT

## 2024-03-17 NOTE — Discharge Summary (Signed)
 Physician Discharge Summary  Patient ID: Alison Huffman MRN: [id number] DOB/AGE: [birth year] 83 y.o.  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) 01/16/2023   Baker's cyst 07/17/2022   Memory difficulty 07/17/2022   Cerebral microvascular disease 07/17/2022   ILD (interstitial lung disease) (HCC) 03/24/2022   Scarring of lung 11/09/2020   Aortic atherosclerosis (HCC) 11/09/2020   Impaired functional mobility, balance, gait, and endurance 09/22/2020   Seborrheic dermatitis 08/06/2020   Rash 08/06/2020   Bilateral leg edema 06/22/2020   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   Bronchiectasis without complication (HCC) 06/14/2020   Alison Barr syndrome (HCC) 04/18/2020   Chronic diastolic (congestive) heart failure (HCC)    Coronary artery disease involving native coronary artery of native heart without angina pectoris    Cyst of left ovary 01/15/2019   History of gastroesophageal reflux (GERD) 12/12/2018   Postmenopausal bleeding 09/30/2018   Thickened endometrium 09/30/2018   Bilateral shoulder pain 09/20/2018   Second degree AV block, Mobitz type II 07/22/2018   Dyspnea on exertion 03/21/2018   Greater trochanteric bursitis of both hips 12/18/2017   Psoriasis 09/27/2017   Prediabetes 09/19/2017   Stiffness of right hand joint 08/28/2017   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 10/2018 MV: EF 57%. No ischemia/infarct.   Cholelithiasis    Coronary artery disease    Dyspnea    GERD (gastroesophageal reflux disease)    Alison-Barre (HCC)    Headache    Hyperlipidemia    Hypertension    Inflammatory neuropathy (HCC)    Joint pain in fingers of right hand    Lichen sclerosus    Osteopenia    Pneumonia 2012? 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   Psoriasis    Psoriatic arthritis (HCC)    RBBB (right bundle branch block)    Rosacea    Seborrheic keratosis    Second degree heart block    a.) s/p SJM PPM placement 07/23/2018   Sleep apnea    a.) does not utilize prescribed nocturnal PAP therapy   TMJ (dislocation of temporomandibular joint)    Unilateral primary osteoarthritis, left knee      Transfusion: none   Consultants (if any):   Discharged Condition: Improved  Hospital Course: Alison Huffman is an 83 y.o. female who was admitted 03/17/2024 with a diagnosis of S/P TKR (total knee replacement), left and went to the operating room on 03/17/2024 and underwent the above named procedures.    Surgeries: Procedure(s): ARTHROPLASTY, KNEE,  TOTAL on 03/17/2024 Patient tolerated the surgery well. Taken to PACU where she was stabilized and then transferred to the orthopedic floor.  Started on Lovenox  30 mg q 12 hrs. TEDs and SCDs applied bilaterally. Heels elevated on bed. No evidence of DVT. Negative Homan. Physical therapy started on day #1 for gait training and transfer. OT started day #1 for ADL and assisted devices.  Patient's IV was d/c on day #1. Patient was able to safely  and independently complete all PT goals. PT recommending discharge to home.    On post op day #1 patient was stable and ready for discharge to SNF[facility].  Implants:  Femur: Persona Tivanium Size 10 CR   Tibia: Persona Size E w/ 14x63mm stem extension  Poly: 11mm MC  Patella: 35x84mm symmetric    She was given perioperative antibiotics:  Anti-infectives (From admission, onward)    Start     Dose/Rate Route Frequency Ordered Stop   03/17/24 1630  ceFAZolin  (ANCEF ) IVPB 2g/100 mL premix        2 g 200 mL/hr over 30 Minutes Intravenous Every 6 hours 03/17/24 1536 03/18/24 0429   03/17/24 0915  ceFAZolin  (ANCEF ) IVPB 2g/100 mL premix        2 g 200 mL/hr over 30 Minutes Intravenous On call to O.R. 03/17/24 1914 03/17/24 1105     .  She was given sequential compression devices, early ambulation, and Lovneox TEDs for DVT prophylaxis.  She benefited maximally from the hospital stay and there were no complications.    Recent vital signs:  Vitals:   03/17/24 1515 03/17/24 1530  BP: (!) 141/60 (!) 121/109  Pulse: 75 82  Resp: 19 18  Temp:    SpO2: 93% 91%    Recent laboratory studies:  Lab Results  Component Value Date   HGB 12.4 03/06/2024   HGB 12.6 09/12/2022   HGB 12.6 12/22/2021   Lab Results  Component Value Date   WBC 5.3 03/06/2024   PLT 201 03/06/2024   No results found for: INR Lab Results  Component Value Date   NA 137 03/06/2024   K 3.8 03/06/2024   CL 105 03/06/2024   CO2 26 03/06/2024   BUN 14 03/06/2024   CREATININE 0.56 03/06/2024   GLUCOSE 91 03/06/2024    Discharge Medications:   Allergies as of 03/17/2024       Reactions   Enbrel [etanercept] Other (See Comments)   Alison Huffman   Codeine Other (See Comments)   Stomach ache   Nickel         Medication List     STOP taking these medications    naproxen sodium 220 MG tablet Commonly known as: ALEVE       TAKE these medications    acetaminophen  500 MG  tablet Commonly known as: TYLENOL  Take 2 tablets (1,000 mg total) by mouth every 8 (eight) hours. What changed:  medication strength how much to take when to take this reasons to take this   AeroChamber MV inhaler Use as instructed   albuterol  108 (90 Base) MCG/ACT inhaler Commonly known as: VENTOLIN  HFA Inhale 2 puffs into the lungs every 6 (six) hours as needed for wheezing or shortness of breath.   budesonide -formoterol  160-4.5 MCG/ACT inhaler Commonly known as: Symbicort  Inhale 2 puffs into the lungs in the morning and at bedtime.   carboxymethylcellulose 0.5 % Soln Commonly known as: REFRESH PLUS Place 1-2 drops into both eyes in the morning, at noon, and at  bedtime.   celecoxib 200 MG capsule Commonly known as: CeleBREX Take 1 capsule (200 mg total) by mouth 2 (two) times daily for 10 days.   clobetasol  ointment 0.05 % Commonly known as: TEMOVATE  Apply 1 Application topically once a week. Lichen sclerosus   docusate sodium  100 MG capsule Commonly known as: COLACE Take 1 capsule (100 mg total) by mouth 2 (two) times daily.   enoxaparin  40 MG/0.4ML injection Commonly known as: LOVENOX  Inject 0.4 mLs (40 mg total) into the skin daily for 14 days.   furosemide  20 MG tablet Commonly known as: LASIX  TAKE ONE TABLET BY MOUTH EVERY DAY AS NEEDED FOR EDEMA   losartan  25 MG tablet Commonly known as: COZAAR  Take 1 tablet (25 mg total) by mouth daily.   ondansetron  4 MG tablet Commonly known as: ZOFRAN  Take 1 tablet (4 mg total) by mouth every 6 (six) hours as needed for nausea.   oxyCODONE  5 MG immediate release tablet Commonly known as: Roxicodone  Take 0.5 tablets (2.5 mg total) by mouth every 8 (eight) hours as needed for breakthrough pain.   rosuvastatin  20 MG tablet Commonly known as: Crestor  Take 1 tablet (20 mg total) by mouth daily. What changed: when to take this   tacrolimus  0.1 % ointment Commonly known as: PROTOPIC  Apply 1 Application topically 2  (two) times daily as needed (for psoriasis).   traMADol 50 MG tablet Commonly known as: ULTRAM Take 1 tablet (50 mg total) by mouth every 6 (six) hours as needed for moderate pain (pain score 4-6).   Vitamin D  (Ergocalciferol ) 50 MCG (2000 UT) Caps Take 2,000 Units by mouth in the morning.        Diagnostic Studies: DG Knee Left Port Result Date: 03/17/2024 CLINICAL DATA:  Total knee replacement EXAM: PORTABLE LEFT KNEE - 2 VIEW COMPARISON:  None Available. FINDINGS: Surgical changes of total knee arthroplasty with cemented femoral and tibial component. Patellar button. Expected alignment. Osteopenia. No fracture or dislocation. Overlapping bandage/brace. Gas in the joint space and in the soft tissues. Imaging was obtained to aid in treatment. IMPRESSION: Acute surgical changes of total knee arthroplasty. Electronically Signed   By: Adrianna Horde M.D.   On: 03/17/2024 13:38    Disposition:      Follow-up Information     Coralyn Derry, PA-C Follow up in 2 week(s).   Specialties: Orthopedic Surgery, Emergency Medicine Contact information: [address] [place] [address] [phone]                  Signed: Coralyn Derry 03/17/2024, 3:43 PM

## 2024-03-17 NOTE — Op Note (Signed)
 Patient Name: Alison Huffman  NWG:[id number]  Pre-Operative Diagnosis: Left knee Osteoarthritis  Post-Operative Diagnosis: (same)  Procedure: Left Total Knee Arthroplasty  Components/Implants: Femur: Persona Tivanium Size 10 CR   Tibia: Persona Size E w/ 14x53mm stem extension  Poly: 11mm MC  Patella: 35x79mm symmetric  Femoral Valgus Cut Angle: 5 degrees  Distal Femoral Re-cut: none  Patella Resurfacing: yes   Date of Surgery: 03/17/2024  Surgeon: Venus Ginsberg MD  Assistant: Francenia Ingle PA (present and scrubbed throughout the case, critical for assistance with exposure, retraction, instrumentation, and closure), Sarah PAs   Anesthesiologist: Piscitello  Anesthesia: Spinal   Tourniquet Time: 58 min  EBL: 50cc  IVF: 700cc  Complications: None   Brief history: The patient is a 83 year old female with a history of osteoarthritis of the left knee with pain limiting their range of motion and activities of daily living, which has failed multiple attempts at conservative therapy.  The risks and benefits of total knee arthroplasty as definitive surgical treatment were discussed with the patient, who opted to proceed with the operation.  After outpatient medical clearance and optimization was completed the patient was admitted to [facility] for the procedure.  All preoperative films were reviewed and an appropriate surgical plan was made prior to surgery. Preoperative range of motion was 5 to 115 with a 5 flexion contracture. The patient was identified as having a varus alignment.   Description of procedure: The patient was brought to the operating room where laterality was confirmed by all those present to be the left side.   Spinal anesthesia was administered and the patient received an intravenous dose of antibiotics for surgical prophylaxis and a dose of tranexamic acid.  Patient is positioned supine on the operating room table with all bony prominences  well-padded.  A well-padded tourniquet was applied to the left thigh.  The knee was then prepped and draped in usual sterile fashion with multiple layers of adhesive and nonadhesive drapes.  All of those present in the operating room participated in a surgical timeout laterality and patient were confirmed.   An Esmarch was wrapped around the extremity and the leg was elevated and the knee flexed.  The tourniquet was inflated to a pressure of 250 mmHg. The Esmarch was removed and the leg was brought down to full extension.  The patella and tibial tubercle identified and outlined using a marking pen and a midline skin incision was made with a knife carried through the subcutaneous tissue down to the extensor retinaculum.  After exposure of the extensor mechanism the medial parapatellar arthrotomy was performed with a scalpel and electrocautery extending down medial and distal to the tibial tubercle taking care to avoid incising the patellar tendon.   A standard medial release was performed over the proximal tibia.  The knee was brought into extension in order to excise the fat pad taking care not to damage the patella tendon.  The superior soft tissue was removed from the anterior surface of the distal femur to visualize for the procedure.  The knee was then brought into flexion with the patella subluxed laterally and subluxing the tibia anteriorly.  The ACL was transected and removed with electrocautery and additional soft tissue was removed from the proximal surface of the tibia to fully expose. The PCL was found to be intact and was preserved.  An extramedullary tibial cutting guide was then applied to the leg with a spring-loaded ankle clamp placed around the distal tibia just above the malleoli  the angulation of the guide was adjusted to give some posterior slope in the tibial resection with an appropriate varus/valgus alignment.  The resection guide was then pinned to the proximal tibia and the proximal  tibial surface was resected with an oscillating saw.  Careful attention was paid to ensure the blade did not disrupt any of the soft tissues including any lateral or medial ligament.  Attention was then turned to the femur, with the knee slightly flexed a opening drill was used to enter the medullary canal of the femur.  After removing the drill marrow was suctioned out to decompress the distal femur.  An intramedullary femoral guide was then inserted into the drill hole and the alignment guide was seated firmly against the distal end of the medial femoral condyle.  The distal femoral cutting guide was then attached and pinned securely to the anterior surface of the femur and the intramedullary rod and alignment guide was removed.  Distal femur resection was then performed with an oscillating saw with retractors protecting medial and laterally.   The distal cutting block was then removed and the extension gap was checked with a spacer.  Extension gap was found to be appropriately sized to accommodate the spacer block.   The femoral sizing guide was then placed securely into the posterior condyles of the femur and the femoral size was measured and determined to be 10.  The size 10; 4-in-1 cutting guide was placed in position and secured with 2 pins.  The anterior posterior and chamfer resections were then performed with an oscillating saw.  Bony fragments and osteophytes were then removed.  Using a lamina spreader the posterior medial and lateral condyles were checked for additional osteophytes and posterior soft tissue remnants.  Any remaining meniscus was removed at this time.  Periarticular injection was performed in the meniscal rims and posterior capsule with aspiration performed to ensure no intravascular injection.   The tibia was then exposed and the tibial trial was pinned onto the plateau after confirming appropriate orientation and rotation.  Using the drill bushing the tibia was prepared to the  appropriate drill depth.  Tibial broach impactor was then driven through the punch guide using a mallet.  The femoral trial component was then inserted onto the femur.   A trial tibial polyethylene bearing was then placed and the knee was reduced.  The knee achieved full extension with no hyperextension and was found to be balanced in flexion and extension with the trials in place.  The knee was then brought into full extension the patella was everted and held with 2 Kocher clamps.  The articular surface of the patella was then resected with an patella reamer and saw after careful measurement with a caliper.  The patella was then prepared with the drill guide and a trial patella was placed.  The knee was then taken through range of motion and it was found that the patella articulated appropriately with the trochlea and good patellofemoral motion without subluxation.    The correct final components for implantation were confirmed and opened by the circulator nurse.  A bone plug was fashioned from the patient's bone cuts to plug the intramedullary femoral canal access hole and was tamped into place.  The prepared surfaces of the patella femur and tibia were cleaned with pulsatile lavage to remove all blood fat and other material and then the surfaces were dried.  2 bags of cement were mixed under vacuum and the components were cemented into place.  Excess cement was removed with curettes and forceps. A trial polyethylene tibial component was placed and the knee was brought into extension to allow the cement to set.  At this time the periarticular injection cocktail was placed in the soft tissues surrounding the knee.  After full curing of the cement the balance of the knee was checked again and the final polyethylene size was confirmed. The tibial component was irrigated and locking mechanism checked to ensure it was clear of debris. The real polyethylene tibial component was implanted and the knee was brought  through a range of motion.   The knee was then irrigated with copious amount of normal saline via pulsatile lavage to remove all loose bodies and other debris.  The knee was then irrigated with surgiphor betadine based wash and reirrigated with saline.  The tourniquet was then dropped and all bleeding vessels were identified and coagulated.  The arthrotomy was approximated with #1 Vicryl and closed with #1 Stratafix suture.  The knee was brought into slight flexion and the subcutaneous tissues were closed with 0 Vicryl, 2-0 Vicryl and a running subcuticular 4-0 stratafix barbed suture.  Skin was then glued with Dermabond.  A sterile adhesive dressing was then placed along with a sequential compression device to the calf, a Ted stocking, and a cryotherapy cuff.   Sponge, needle, and Lap counts were all correct at the end of the case.   The patient was transferred off of the operating room table to a hospital bed, good pulses were found distally on the operative side.  The patient was transferred to the recovery room in stable condition.

## 2024-03-17 NOTE — Anesthesia Procedure Notes (Signed)
 Procedure Name: MAC Date/Time: 03/17/2024 10:45 AM  Performed by: Marisue Side, CRNAPre-anesthesia Checklist: Patient identified, Emergency Drugs available, Suction available and Patient being monitored Patient Re-evaluated:Patient Re-evaluated prior to induction Oxygen Delivery Method: Simple face mask Preoxygenation: Pre-oxygenation with 100% oxygen Induction Type: IV induction

## 2024-03-17 NOTE — Anesthesia Preprocedure Evaluation (Addendum)
 Anesthesia Evaluation  Patient identified by MRN, date of birth, ID band Patient awake    Reviewed: Allergy & Precautions, NPO status , Patient's Chart, lab work & pertinent test results  History of Anesthesia Complications Negative for: history of anesthetic complications  Airway Mallampati: III  TM Distance: <3 FB Neck ROM: full    Dental  (+) Chipped, Poor Dentition, Missing   Pulmonary shortness of breath, sleep apnea , COPD   Pulmonary exam normal        Cardiovascular hypertension, + CAD and +CHF  Normal cardiovascular exam+ dysrhythmias + pacemaker      Neuro/Psych  Headaches  Neuromuscular disease  negative psych ROS   GI/Hepatic Neg liver ROS,GERD  Controlled,,  Endo/Other  negative endocrine ROS    Renal/GU      Musculoskeletal   Abdominal   Peds  Hematology negative hematology ROS (+)   Anesthesia Other Findings Patient has cardiac clearance for this procedure.   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 10/2018 MV: EF 57%. No ischemia/infarct. No date: Cholelithiasis No date: Coronary artery disease No date: Dyspnea No date: GERD (gastroesophageal reflux disease) No date: Guillain-Barre (HCC) No date: Headache No date: Hyperlipidemia No date: Hypertension No date: Inflammatory neuropathy (HCC) No date: Joint pain in fingers of right hand No date: Lichen sclerosus No date: Osteopenia 2012? X 1: Pneumonia No date: Pre-diabetes 07/23/2018: Presence of permanent cardiac pacemaker     Comment:  a.) SJM Assurity MRI model [id number] (Ser# [id number]). No date: Psoriasis No date: Psoriatic arthritis (HCC) No date: RBBB (right bundle branch block) No date: Rosacea No date:  Seborrheic keratosis No date: Second degree heart block     Comment:  a.) s/p SJM PPM placement 07/23/2018 No date: Sleep apnea     Comment:  a.) does not utilize prescribed nocturnal PAP therapy No date: TMJ (dislocation of temporomandibular joint) No date: Unilateral primary osteoarthritis, left knee  Past Surgical History: No date: APPENDECTOMY No date: BREAST CYST ASPIRATION; Bilateral 1978: BREAST CYST EXCISION; Right     Comment:  benign No date: COLONOSCOPY 07/10/2019: COLONOSCOPY WITH PROPOFOL ; N/A     Comment:  Procedure: COLONOSCOPY WITH PROPOFOL ;  Surgeon:               Deveron Fly, MD;  Location: [facility] ENDOSCOPY;                Service: Endoscopy;  Laterality: N/A; 07/10/2019: ESOPHAGOGASTRODUODENOSCOPY (EGD) WITH PROPOFOL ; N/A     Comment:  Procedure: ESOPHAGOGASTRODUODENOSCOPY (EGD) WITH               PROPOFOL ;  Surgeon: Deveron Fly, MD;  Location:               [facility] ENDOSCOPY;  Service: Endoscopy;  Laterality: N/A; 11/05/2018: HYSTEROSCOPY WITH D & C; N/A     Comment:  Procedure: DILATATION AND CURETTAGE /HYSTEROSCOPY;                Surgeon: Heron Lord, MD;  Location: [facility] ORS;               Service: Gynecology;  Laterality: N/A; No date: INSERT / REPLACE / REMOVE PACEMAKER 07/23/2018: PACEMAKER IMPLANT; N/A     Comment:  SJM Assurity [id number] implanted  by Dr Nunzio Belch for mobitz II               second degree AV block 12/05/2023: TOOTH EXTRACTION No date: UTERINE POLYPS REMOVAL     Reproductive/Obstetrics negative OB ROS                             Anesthesia Physical Anesthesia Plan  ASA: 3  Anesthesia Plan: Spinal   Post-op Pain Management:    Induction:   PONV Risk Score and Plan:   Airway Management Planned: Natural Airway and Nasal Cannula  Additional Equipment:   Intra-op Plan:   Post-operative Plan:   Informed Consent: I have reviewed the patients History and Physical, chart, labs and discussed  the procedure including the risks, benefits and alternatives for the proposed anesthesia with the patient or authorized representative who has indicated his/her understanding and acceptance.     Dental Advisory Given  Plan Discussed with: Anesthesiologist, CRNA and Surgeon  Anesthesia Plan Comments: (Patient reports no bleeding problems and no anticoagulant use.  Plan for spinal with backup GA. Consented for risk of damage to bone graft or worsening of vocal cord paralysis if airway placement is required. She voiced assent.  Patient consented for risks of anesthesia including but not limited to:  - adverse reactions to medications - damage to eyes, teeth, lips or other oral mucosa - nerve damage due to positioning  - risk of bleeding, infection and or nerve damage from spinal that could lead to paralysis - risk of headache or failed spinal - damage to teeth, lips or other oral mucosa - sore throat or hoarseness - damage to heart, brain, nerves, lungs, other parts of body or loss of life  Patient voiced understanding and assent.)       Anesthesia Quick Evaluation

## 2024-03-17 NOTE — Plan of Care (Signed)
  Problem: Activity: Goal: Range of joint motion will improve Outcome: Progressing   Problem: Clinical Measurements: Goal: Postoperative complications will be avoided or minimized Outcome: Progressing   Problem: Pain Management: Goal: Pain level will decrease with appropriate interventions Outcome: Progressing   

## 2024-03-17 NOTE — Transfer of Care (Signed)
 Immediate Anesthesia Transfer of Care Note  Patient: Alison Huffman  Procedure(s) Performed: ARTHROPLASTY, KNEE, TOTAL (Left: Knee)  Patient Location: PACU  Anesthesia Type:Spinal  Level of Consciousness: awake, alert , and oriented  Airway & Oxygen Therapy: Patient Spontanous Breathing  Post-op Assessment: Report given to RN and Post -op Vital signs reviewed and stable  Post vital signs: Reviewed and stable  Last Vitals:  Vitals Value Taken Time  BP    Temp 97.5   Pulse 73   Resp 12   SpO2 96     Last Pain:  Vitals:   03/17/24 0905  TempSrc: Temporal  PainSc: 0-No pain         Complications: No notable events documented.

## 2024-03-17 NOTE — H&P (Signed)
 Chief Complaint  Patient presents with  Pre-op Exam  Scheduled for Left TKA 03/24/24 with Dr. Hulda Mage Huffman is a 83 y.o. female who presents today for history and physical for left total knee arthroplasty with Dr. Clyda Huffman on 03/24/2024. The patient has had knee pain since April 2023. She was diagnosed with osteoarthritis. She describes stabbing and burning pain primarily on the medial aspect of the left knee. Occasionally she will have some instability. She is currently wearing a knee hinged brace that gives her some mild relief. She uses a cane intermittently for mobility. She has had cortisone injections as well as taken over-the-counter anti-inflammatory medications with diminishing results. Patient's pain is interfering with her quality of life and activities daily living. She has seen Dr. Clyda Huffman, discussed total knee arthroplasty as well as risks, benefits and complications.  Past Medical History: Past Medical History:  Diagnosis Date  Abnormal ultrasound  Ankle pain, right  Heart disease  History of gastroesophageal reflux (GERD) 12/12/2018  Hyperlipemia  Hypertension  Joint pain in fingers of right hand  Lichen sclerosus of vulva  Pacemaker  Psoriasis  Psoriasis  Psoriatic arthritis (CMS/HHS-HCC)  Sleep apnea  TMJ (temporomandibular joint syndrome)  Uterine polyp   Past Surgical History: Past Surgical History:  Procedure Laterality Date  APPENDECTOMY 1960  [facility] in [place], [place]  INSERTION SINGLE CHAMBER PACEMAKER GENERATOR 2019  INSERTION DUAL CHAMBER PACEMAKER GENERATOR 07/2018  COLONOSCOPY 07/10/2019  Redundant colon/Diverticulosis/No Repeat/MUS  EGD 07/10/2019  GERD/No Repeat/MUS  COLONOSCOPY  upper endoscopy  uterine polyps removal   Past Family History: Family History  Problem Relation Age of Onset  Osteoporosis (Thinning of bones) Mother  Osteoporosis (Thinning of bones) Father  Stroke Father  Atrial fibrillation (Abnormal  heart rhythm sometimes requiring treatment with blood thinners) Father  High blood pressure (Hypertension) Father  Hyperlipidemia (Elevated cholesterol) Father  High blood pressure (Hypertension) Sister  Hyperlipidemia (Elevated cholesterol) Sister  High blood pressure (Hypertension) Brother  Cancer Sister   Medications: Current Outpatient Medications  Medication Sig Dispense Refill  acetaminophen  (TYLENOL ) 325 MG tablet Take 325 mg by mouth continuously as needed  budesonide -formoteroL  (SYMBICORT ) 160-4.5 mcg/actuation inhaler Inhale 2 Inhalations into the lungs 2 (two) times daily  calcium  carbonate (TUMS E-X) 300 mg (750 mg) chewable tablet Take 300 mg of elemental by mouth continuously as needed  cholecalciferol  (VITAMIN D3) 2,000 unit capsule Take by mouth once daily  clobetasoL  (TEMOVATE ) 0.05 % ointment  COMPACT SPACE CHAMBER spacer  FUROsemide  (LASIX ) 20 MG tablet  ketoconazole  (NIZORAL ) 2 % cream  losartan  (COZAAR ) 25 MG tablet Take 1 tablet by mouth once daily  methylcellulose (CITRUCEL ORAL) Take by mouth  metroNIDAZOLE (METROCREAM) 0.75 % cream Apply topically continuously as needed  naproxen sodium (ALEVE ORAL) Take by mouth as needed  nystatin (MYCOSTATIN) 100,000 unit/gram cream as needed  polyethylene glycol (MIRALAX ) powder Take by mouth  rosuvastatin  (CRESTOR ) 5 MG tablet Take 5 mg by mouth once daily  tacrolimus  (PROTOPIC ) 0.1 % ointment continuously as needed For psoriasis symptoms   No current facility-administered medications for this visit.   Allergies: Allergies  Allergen Reactions  Enbrel [Etanercept] Other (See Comments)  Paralysis of body, GBS, cardiac arrest  Nickel Other (See Comments)    Review of Systems:  A comprehensive 14 point ROS was performed, reviewed by me today, and the pertinent orthopaedic findings are documented in the HPI.  Exam: BP (!) 140/92  Ht 167.6 cm (5' 6)  Wt 82.5 kg (181 lb 12.8 oz)  BMI 29.34 kg/m  General:  Well  developed, well nourished, no apparent distress, normal affect, minimal antalgic gait  HEENT: Head normocephalic, atraumatic, PERRL.   Abdomen: Soft, non tender, non distended, Bowel sounds present.  Heart: Examination of the heart reveals regular, rate, and rhythm. There is no murmur noted on ascultation. There is a normal apical pulse.  Lungs: Lungs are clear to auscultation. There is no wheeze, rhonchi, or crackles. There is normal expansion of bilateral chest walls.   Examination of the left lower extremity shows no swelling warmth erythema or edema. Minimal swelling. No significant effusion. No quad atrophy. Patella tracks well. Tender along the medial joint line. Mild patellofemoral crepitation. 5 to 115 degrees range of motion. Normal active extension of the knee. Normal plantarflexion and dorsiflexion of the ankle. No laxity with valgus or varus stress testing of the knee. She is neuro vas intact in left lower extremity.  X-rays of the left knee reviewed by me today show moderate degenerative changes throughout the knee most severe in the medial compartment where she has bone-on-bone. There is narrowing in the patellofemoral and lateral compartment.  Impression: Primary osteoarthritis of left knee [M17.12] Primary osteoarthritis of left knee (primary encounter diagnosis)  Plan:  21. 83 year old female with advanced left knee osteoarthritis, complete loss of joint space along the medial compartment. She has severe pain along the medial joint line of her left knee that is interfering with her quality of life and activities of daily living. She has occasional instability, has resorted to using a brace and a cane to help with mobility.   Risks and benefits of left total knee replacement with a nickel free implant were reviewed with patient and she agrees with plan.

## 2024-03-17 NOTE — Plan of Care (Signed)
 Continue education.

## 2024-03-17 NOTE — Discharge Instructions (Signed)

## 2024-03-17 NOTE — Progress Notes (Signed)
 PT Cancellation Note  Patient Details Name: Alison Huffman MRN: [id number] DOB: [birth year]   Cancelled Treatment:    Reason Eval/Treat Not Completed: Patient at procedure or test/unavailable (PT order still not released at this time, will plan to evaluate at later date/time.)  4:05 PM, 03/17/24 Dawn Eth, PT, DPT Physical Therapist - [facility]  [phone] (ASCOM)    Verlene Glantz C 03/17/2024, 4:05 PM

## 2024-03-17 NOTE — Interval H&P Note (Signed)
 Patient history and physical updated. Consent reviewed including risks, benefits, and alternatives to surgery. Patient agrees with above plan to proceed with left total knee arthroplasty.

## 2024-03-17 NOTE — Anesthesia Procedure Notes (Signed)
 Spinal  Patient location during procedure: OR Start time: 03/17/2024 10:45 AM End time: 03/17/2024 10:49 AM Reason for block: surgical anesthesia Staffing Performed: resident/CRNA  Anesthesiologist: Piscitello, Portia Brittle, MD Resident/CRNA: Marisue Side, CRNA Performed by: Marisue Side, CRNA Authorized by: Morna Arabian, MD   Preanesthetic Checklist Completed: patient identified, IV checked, site marked, risks and benefits discussed, surgical consent, monitors and equipment checked and pre-op evaluation Spinal Block Patient position: sitting Prep: Betadine Patient monitoring: heart rate, continuous pulse ox, blood pressure and cardiac monitor Approach: midline Location: L4-5 Injection technique: single-shot Needle Needle type: Whitacre and Introducer  Needle gauge: 24 G Needle length: 9 cm Assessment Events: CSF return Additional Notes Negative paresthesia. Negative blood return. Positive free-flowing CSF. Expiration date of kit checked and confirmed. Patient tolerated procedure well, without complications.

## 2024-03-17 NOTE — Progress Notes (Signed)
 FL2 document placed in patient's chart.   Cathlyn Coast, RN

## 2024-03-18 ENCOUNTER — Encounter: Payer: Self-pay | Admitting: Orthopedic Surgery

## 2024-03-18 DIAGNOSIS — I5032 Chronic diastolic (congestive) heart failure: Secondary | ICD-10-CM | POA: Diagnosis not present

## 2024-03-18 DIAGNOSIS — I110 Hypertensive heart disease with heart failure: Secondary | ICD-10-CM | POA: Diagnosis not present

## 2024-03-18 DIAGNOSIS — Z471 Aftercare following joint replacement surgery: Secondary | ICD-10-CM | POA: Diagnosis not present

## 2024-03-18 DIAGNOSIS — R278 Other lack of coordination: Secondary | ICD-10-CM | POA: Diagnosis not present

## 2024-03-18 DIAGNOSIS — E785 Hyperlipidemia, unspecified: Secondary | ICD-10-CM | POA: Diagnosis not present

## 2024-03-18 DIAGNOSIS — I2510 Atherosclerotic heart disease of native coronary artery without angina pectoris: Secondary | ICD-10-CM | POA: Diagnosis not present

## 2024-03-18 DIAGNOSIS — M79605 Pain in left leg: Secondary | ICD-10-CM | POA: Diagnosis not present

## 2024-03-18 DIAGNOSIS — M1712 Unilateral primary osteoarthritis, left knee: Secondary | ICD-10-CM | POA: Diagnosis not present

## 2024-03-18 DIAGNOSIS — M6281 Muscle weakness (generalized): Secondary | ICD-10-CM | POA: Diagnosis not present

## 2024-03-18 DIAGNOSIS — M25762 Osteophyte, left knee: Secondary | ICD-10-CM | POA: Diagnosis not present

## 2024-03-18 LAB — BASIC METABOLIC PANEL WITH GFR
Anion gap: 5 (ref 5–15)
BUN: 16 mg/dL (ref 8–23)
CO2: 25 mmol/L (ref 22–32)
Calcium: 8.9 mg/dL (ref 8.9–10.3)
Chloride: 106 mmol/L (ref 98–111)
Creatinine, Ser: 0.56 mg/dL (ref 0.44–1.00)
GFR, Estimated: >60 mL/min (ref >60)
Glucose, Bld: 130 mg/dL — ABNORMAL HIGH (ref 70–99)
Potassium: 4.3 mmol/L (ref 3.5–5.1)
Sodium: 136 mmol/L (ref 135–145)

## 2024-03-18 LAB — CBC
HCT: 33.8 % — ABNORMAL LOW (ref 36.0–46.0)
Hemoglobin: 11.2 g/dL — ABNORMAL LOW (ref 12.0–15.0)
MCH: 31.5 pg (ref 26.0–34.0)
MCHC: 33.1 g/dL (ref 30.0–36.0)
MCV: 94.9 fL (ref 80.0–100.0)
Platelets: 179 10*3/uL (ref 150–400)
RBC: 3.56 MIL/uL — ABNORMAL LOW (ref 3.87–5.11)
RDW: 12.9 % (ref 11.5–15.5)
WBC: 10 10*3/uL (ref 4.0–10.5)
nRBC: 0 % (ref 0.0–0.2)

## 2024-03-18 MED ORDER — ENOXAPARIN SODIUM 40 MG/0.4ML IJ SOSY
40.0000 mg | PREFILLED_SYRINGE | INTRAMUSCULAR | 0 refills | Status: DC
Start: 2024-03-18 — End: 2024-04-15

## 2024-03-18 MED ORDER — TRAMADOL HCL 50 MG PO TABS: 50.0000 mg | ORAL_TABLET | Freq: Four times a day (QID) | ORAL | 0 refills | Status: DC | PRN

## 2024-03-18 MED ORDER — DOCUSATE SODIUM 100 MG PO CAPS: 100.0000 mg | ORAL_CAPSULE | Freq: Two times a day (BID) | ORAL | 0 refills | Status: DC

## 2024-03-18 MED ORDER — OXYCODONE HCL 5 MG PO TABS: 2.5000 mg | ORAL_TABLET | Freq: Three times a day (TID) | ORAL | 0 refills | Status: DC | PRN

## 2024-03-18 MED ORDER — ONDANSETRON HCL 4 MG PO TABS: 4.0000 mg | ORAL_TABLET | Freq: Four times a day (QID) | ORAL | 0 refills | Status: DC | PRN

## 2024-03-18 MED ORDER — CELECOXIB 200 MG PO CAPS: 200.0000 mg | ORAL_CAPSULE | Freq: Two times a day (BID) | ORAL | 0 refills | Status: AC

## 2024-03-18 NOTE — Anesthesia Postprocedure Evaluation (Signed)
 Anesthesia Post Note  Patient: Alison Huffman  Procedure(s) Performed: ARTHROPLASTY, KNEE, TOTAL (Left: Knee)  Patient location during evaluation: Nursing Unit Anesthesia Type: Spinal Level of consciousness: awake Pain management: pain level controlled Cardiovascular status: stable Postop Assessment: no headache Anesthetic complications: no   No notable events documented.   Last Vitals:  Vitals:   03/18/24 0029 03/18/24 0342  BP: (!) 144/61 (!) 132/55  Pulse: 60 61  Resp: 18 16  Temp: (!) 36.4 C 36.4 C  SpO2: 96% 97%    Last Pain:  Vitals:   03/18/24 0342  TempSrc: Temporal  PainSc:                  Curvin Downing

## 2024-03-18 NOTE — Progress Notes (Signed)
 Patient is not able to walk the distance required to go the bathroom, or he/she is unable to safely negotiate stairs required to access the bathroom.  A 3in1 BSC will alleviate this problem

## 2024-03-18 NOTE — Progress Notes (Addendum)
   Subjective: 1 Day Post-Op Procedure(s) (LRB): ARTHROPLASTY, KNEE, TOTAL (Left) Patient reports pain as mild.   Patient is well, and has had no acute complaints or problems Denies any CP, SOB, ABD pain. We will continue therapy today.  Plan is to go to [facility] today  Objective: Vital signs in last 24 hours: Temp:  [97 F (36.1 C)-98.3 F (36.8 C)] 97.6 F (36.4 C) (06/17 0342) Pulse Rate:  [60-87] 61 (06/17 0342) Resp:  [11-19] 16 (06/17 0342) BP: (106-174)/(54-109) 132/55 (06/17 0342) SpO2:  [91 %-99 %] 97 % (06/17 0342) Weight:  [82.1 kg] 82.1 kg (06/16 1401)  Intake/Output from previous day: 06/16 0701 - 06/17 0700 In: 1498.2 [I.V.:1000; IV Piggyback:498.2] Out: 850 [Urine:800; Blood:50] Intake/Output this shift: No intake/output data recorded.  Recent Labs    03/18/24 0615  HGB 11.2*   Recent Labs    03/18/24 0615  WBC 10.0  RBC 3.56*  HCT 33.8*  PLT 179   Recent Labs    03/18/24 0615  NA 136  K 4.3  CL 106  CO2 25  BUN 16  CREATININE 0.56  GLUCOSE 130*  CALCIUM  8.9   No results for input(s): LABPT, INR in the last 72 hours.  EXAM General - Patient is Alert, Appropriate, and Oriented Extremity - Neurovascular intact Sensation intact distally Intact pulses distally Dorsiflexion/Plantar flexion intact Dressing - dressing C/D/I and no drainage Motor Function - intact, moving foot and toes well on exam.   Past Medical History:  Diagnosis Date   (HFpEF) heart failure with preserved ejection fraction (HCC)    Aortic atherosclerosis (HCC)    Atrial tachycardia (HCC)    Atypical chest pain    a. 10/2018 MV: EF 57%. No ischemia/infarct.   Cholelithiasis    Coronary artery disease    Dyspnea    GERD (gastroesophageal reflux disease)    Guillain-Barre (HCC)    Headache    Hyperlipidemia    Hypertension    Inflammatory neuropathy (HCC)    Joint pain in fingers of right hand    Lichen sclerosus    Osteopenia    Pneumonia  2012? 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   Psoriasis    Psoriatic arthritis (HCC)    RBBB (right bundle branch block)    Rosacea    Seborrheic keratosis    Second degree heart block    a.) s/p SJM PPM placement 07/23/2018   Sleep apnea    a.) does not utilize prescribed nocturnal PAP therapy   TMJ (dislocation of temporomandibular joint)    Unilateral primary osteoarthritis, left knee     Assessment/Plan:   1 Day Post-Op Procedure(s) (LRB): ARTHROPLASTY, KNEE, TOTAL (Left) Principal Problem:   S/P TKR (total knee replacement), left  Estimated body mass index is 29.21 kg/m as calculated from the following:   Height as of this encounter: 5' 6 (1.676 m).   Weight as of this encounter: 82.1 kg. Advance diet Up with therapy Pain well controlled Labs and VSS CM to assist with discharge to the [facility] today  DVT Prophylaxis - Lovenox , TED hose, and SCDs Weight-Bearing as tolerated to left leg   T. Thomos Flies, PA-C [facility] 03/18/2024, 7:55 AM

## 2024-03-18 NOTE — Plan of Care (Signed)
  Problem: Education: Goal: Knowledge of General Education information will improve Description: Including pain rating scale, medication(s)/side effects and non-pharmacologic comfort measures Outcome: Progressing   Problem: Education: Goal: Knowledge of the prescribed therapeutic regimen will improve Outcome: Progressing   Problem: Activity: Goal: Ability to avoid complications of mobility impairment will improve Outcome: Progressing   Problem: Clinical Measurements: Goal: Postoperative complications will be avoided or minimized Outcome: Progressing

## 2024-03-18 NOTE — Evaluation (Signed)
 Physical Therapy Evaluation Patient Details Name: Alison Huffman MRN: [id number] DOB: Feb 26, 1941 Today's Date: 03/18/2024  History of Present Illness  Alison Huffman is an 83 y.o. female who was admitted 03/17/2024 with a diagnosis of S/P TKR (total knee replacement), left and went to the operating room on 03/17/2024 and underwent the above named procedures.  Clinical Impression  Pt is a pleasant 83 year old female who was admitted for s/p L TKA. Pt performs bed mobility CGA, with no complaints of pain with movement. Pt able to perform STS transfer with initial increased time to perform movement, requiring min verbal and tactile cues on use of RW, no LOB experienced. Ambulation successful with use of RW, pt initially using a step to pattern but quickly able to progress stride length with a step through pattern, no LOB experienced throughout. Pt demonstrates deficits with generalized weakness in L LE and balance, but shows great potential to improve and get stronger. Pt returning to [facility]'s rehab department to receive additional help before returning to their independent living quarters. Pt would benefit from skilled PT interventions to address deficits noted above. PT to follow acutely.          If plan is discharge home, recommend the following: A little help with walking and/or transfers;A little help with bathing/dressing/bathroom;Assistance with cooking/housework;Assist for transportation;Help with stairs or ramp for entrance   Can travel by private vehicle   Yes    Equipment Recommendations Rolling walker (2 wheels)  Recommendations for Other Services       Functional Status Assessment Patient has had a recent decline in their functional status and demonstrates the ability to make significant improvements in function in a reasonable and predictable amount of time.     Precautions / Restrictions Precautions Precautions: Fall Recall of Precautions/Restrictions:  Intact Restrictions Weight Bearing Restrictions Per Provider Order: No      Mobility  Bed Mobility Overal bed mobility: Modified Independent             General bed mobility comments: Supervision A for bed mobility    Transfers Overall transfer level: Needs assistance Equipment used: Rolling walker (2 wheels) Transfers: Sit to/from Stand Sit to Stand: Contact guard assist           General transfer comment: initial verbal and tactile cue on limb placement, initial increased time to perform movement, no LOB experienced    Ambulation/Gait Ambulation/Gait assistance: Contact guard assist Gait Distance (Feet): 100 Feet Assistive device: Rolling walker (2 wheels) Gait Pattern/deviations: Step-to pattern, Step-through pattern, Decreased step length - right, Decreased step length - left       General Gait Details: initial step-to pattern progressed to step through, use of RW, verbal and tactile cues given initially on manipulation of RW, no LOB experienced  Stairs            Wheelchair Mobility     Tilt Bed    Modified Rankin (Stroke Patients Only)       Balance Overall balance assessment: Needs assistance Sitting-balance support: Single extremity supported, Feet supported Sitting balance-Leahy Scale: Good Sitting balance - Comments: sitting EOB, no LOB   Standing balance support: Bilateral upper extremity supported Standing balance-Leahy Scale: Good Standing balance comment: use of RW, no swaying or LOB experienced                             Pertinent Vitals/Pain Pain Assessment Pain Assessment: 0-10 Pain Score: 3  Pain Location: L knee, back of knee Pain Descriptors / Indicators: Aching Pain Intervention(s): Ice applied, Repositioned    Home Living Family/patient expects to be discharged to:: Private residence Living Arrangements: Alone Available Help at Discharge: Family Type of Home: Independent living facility Home Access:  Level entry       Home Layout: One level Home Equipment: Agricultural consultant (2 wheels);Rollator (4 wheels);Cane - quad Additional Comments: Typically uses SPC but uses rollator for longer distances    Prior Function Prior Level of Function : Independent/Modified Independent             Mobility Comments: able to perform mobility ind ADLs Comments: able to perform all ADLs     Extremity/Trunk Assessment   Upper Extremity Assessment Upper Extremity Assessment: Overall WFL for tasks assessed    Lower Extremity Assessment Lower Extremity Assessment: LLE deficits/detail LLE Deficits / Details: unable to formally assess MMT on L knee but grossly 4/5 on R LE LLE: Unable to fully assess due to immobilization LLE Sensation: WNL LLE Coordination: WNL    Cervical / Trunk Assessment Cervical / Trunk Assessment: Kyphotic  Communication   Communication Communication: No apparent difficulties Factors Affecting Communication: Hearing impaired    Cognition Arousal: Alert Behavior During Therapy: WFL for tasks assessed/performed   PT - Cognitive impairments: No apparent impairments                         Following commands: Intact       Cueing Cueing Techniques: Verbal cues, Tactile cues     General Comments      Exercises Total Joint Exercises Ankle Circles/Pumps: AROM, Both, 10 reps, Supine Quad Sets: AROM, Both, 10 reps, Supine Short Arc Quad: AROM, Both, 10 reps, Supine Heel Slides: AROM, Both, 10 reps, Supine, Seated Hip ABduction/ADduction: AROM, Both, 10 reps, Supine Straight Leg Raises: AROM, Both, 10 reps, Supine Long Arc Quad: AROM, Both, 10 reps, Seated Knee Flexion: AROM, Both, 10 reps, Seated Goniometric ROM: 0-90   Assessment/Plan    PT Assessment Patient needs continued PT services  PT Problem List Decreased strength;Decreased range of motion;Decreased activity tolerance;Decreased balance;Decreased mobility;Decreased knowledge of use of  DME       PT Treatment Interventions DME instruction;Gait training;Functional mobility training;Therapeutic activities;Therapeutic exercise;Balance training    PT Goals (Current goals can be found in the Care Plan section)  Acute Rehab PT Goals Patient Stated Goal: to walk better and increase use of L LE PT Goal Formulation: With patient Time For Goal Achievement: 04/01/24 Potential to Achieve Goals: Good    Frequency BID     Co-evaluation               AM-PAC PT 6 Clicks Mobility  Outcome Measure Help needed turning from your back to your side while in a flat bed without using bedrails?: None Help needed moving from lying on your back to sitting on the side of a flat bed without using bedrails?: A Little Help needed moving to and from a bed to a chair (including a wheelchair)?: A Little Help needed standing up from a chair using your arms (e.g., wheelchair or bedside chair)?: A Little Help needed to walk in hospital room?: A Little Help needed climbing 3-5 steps with a railing? : A Lot 6 Click Score: 18    End of Session Equipment Utilized During Treatment: Gait belt Activity Tolerance: Patient tolerated treatment well Patient left: in chair;with call bell/phone within reach;with chair alarm set  Nurse Communication: Mobility status PT Visit Diagnosis: Other abnormalities of gait and mobility (R26.89);Muscle weakness (generalized) (M62.81)    Time: 1610-9604 PT Time Calculation (min) (ACUTE ONLY): 30 min   Charges:                Flo Berroa Romero-Perozo, SPT  03/18/2024, 11:16 AM

## 2024-03-18 NOTE — TOC Transition Note (Signed)
 Transition of Care [facility]) - Discharge Note   Patient Details  Name: Alison Huffman MRN: [id number] Date of Birth: September 04, 1941  Transition of Care [facility]) CM/SW Contact:  Alexandra Ice, RN Phone Number: 03/18/2024, 8:38 AM   Clinical Narrative:    Patient to discharge today, home with home health services. She resides at [facility]. Notified Jullie Oiler at facility that patient to discharge today and needing home health services set up. Received message from bedside nurse, patient needing BSC and RW. Sent DME order to Jon with [employer].   Barriers to Discharge: Barriers Resolved   Patient Goals and CMS Choice   CMS Medicare.gov Compare Post Acute Care list provided to:: Patient Choice offered to / list presented to : Patient  Expected Discharge Plan and Services           Expected Discharge Date: 03/18/24               DME Arranged: Otho Blitz rolling, Bedside commode DME Agency: [employer] Date DME Agency Contacted: 03/18/24 Time DME Agency Contacted: [phone] Representative spoke with at DME Agency: Sam Creighton            Prior Living Arrangements/Services                       Activities of Daily Living   ADL Screening (condition at time of admission) Independently performs ADLs?: Yes (appropriate for developmental age) Is the patient deaf or have difficulty hearing?: No Does the patient have difficulty seeing, even when wearing glasses/contacts?: No Does the patient have difficulty concentrating, remembering, or making decisions?: No  Permission Sought/Granted                  Emotional Assessment              Admission diagnosis:  Primary osteoarthritis of left knee [M17.12] S/P TKR (total knee replacement), left [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] Patient Active Problem List   Diagnosis Date Noted   S/P TKR (total knee replacement), left 03/17/2024   Osteoarthritis, knee 10/19/2023   Eye tearing 10/19/2023   Jaw pain 04/18/2023   Pes planus of both feet  01/16/2023   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) 01/16/2023   Baker's cyst 07/17/2022   Memory difficulty 07/17/2022   Cerebral microvascular disease 07/17/2022   ILD (interstitial lung disease) (HCC) 03/24/2022   Scarring of lung 11/09/2020   Aortic atherosclerosis (HCC) 11/09/2020   Impaired functional mobility, balance, gait, and endurance 09/22/2020   Seborrheic dermatitis 08/06/2020   Rash 08/06/2020   Bilateral leg edema 06/22/2020   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   Bronchiectasis without complication (HCC) 06/14/2020   Guillain Barr syndrome (HCC) 04/18/2020   Chronic diastolic (congestive) heart failure (HCC)    Coronary artery disease involving native coronary artery of native heart without angina pectoris    Cyst of left ovary 01/15/2019   History of gastroesophageal reflux (GERD) 12/12/2018   Postmenopausal bleeding 09/30/2018   Thickened endometrium 09/30/2018   Bilateral shoulder pain 09/20/2018   Second degree AV block, Mobitz type II 07/22/2018   Dyspnea on exertion 03/21/2018   Greater trochanteric bursitis of both hips 12/18/2017   Psoriasis 09/27/2017   Prediabetes 09/19/2017   Stiffness of right hand joint 08/28/2017   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   Allergic rhinitis 06/14/2017   History of carotid stenosis 06/14/2017   Psoriatic arthritis (HCC) 06/14/2017   Lichen sclerosus    GERD (gastroesophageal reflux disease)    Hypertension 03/02/2017  Hyperlipidemia 03/02/2017   OSA (obstructive sleep apnea) 03/02/2017   Osteoporosis 03/02/2017   PCP:  Bluford Burkitt, NP Pharmacy:   [facility] DELIVERY - [address], [place] - [address] [place] [address] Phone: [phone] Fax: [phone]  [facility] [address] [facility] - [place], [place] - [address] [facility] [address] [facility] [address] [place] [address] Phone: [phone] Fax: [phone]     Social Determinants of Health (SDOH)  Interventions    Readmission Risk Interventions     No data to display            Final next level of care: Home w Home Health Services Barriers to Discharge: Barriers Resolved   Patient Goals and CMS Choice   CMS Medicare.gov Compare Post Acute Care list provided to:: Patient Choice offered to / list presented to : Patient      Discharge Placement                    Patient and family notified of of transfer: 03/18/24  Discharge Plan and Services Additional resources added to the After Visit Summary for                  DME Arranged: Walker rolling, Bedside commode DME Agency: [employer] Date DME Agency Contacted: 03/18/24 Time DME Agency Contacted: [phone] Representative spoke with at DME Agency: Sam Creighton            Social Drivers of Health (SDOH) Interventions SDOH Screenings   Food Insecurity: No Food Insecurity (03/17/2024)  Housing: Low Risk  (03/17/2024)  Transportation Needs: No Transportation Needs (03/17/2024)  Utilities: Not At Risk (03/17/2024)  Alcohol Screen: Low Risk  (12/19/2023)  Depression (PHQ2-9): Medium Risk (12/19/2023)  Financial Resource Strain: Low Risk  (02/05/2024)   Received from [facility]  Physical Activity: Inactive (12/19/2023)  Social Connections: Moderately Integrated (03/17/2024)  Stress: No Stress Concern Present (12/19/2023)  Tobacco Use: Low Risk  (03/17/2024)  Recent Concern: Tobacco Use - Medium Risk (02/05/2024)   Received from [facility]  Health Literacy: Adequate Health Literacy (12/19/2023)     Readmission Risk Interventions     No data to display

## 2024-03-18 NOTE — NC FL2 (Signed)
 [place]  MEDICAID FL2 LEVEL OF CARE FORM     IDENTIFICATION  Patient Name: Alison Huffman Birthdate: 09/29/1941 Sex: female Admission Date (Current Location): 03/17/2024  [facility] and [place] Number:  Chiropodist and Address:  [facility], [address], [place], [place] [address]      Provider Number: [id number]  Attending Physician Name and Address:  Venus Ginsberg, MD  Relative Name and Phone Number:  SISTER: Willard Harman,   [phone]    Current Level of Care: Hospital Recommended Level of Care: Skilled Nursing Facility Prior Approval Number:    Date Approved/Denied:   PASRR Number:    Discharge Plan: SNF    Current Diagnoses: Patient Active Problem List   Diagnosis Date Noted   S/P TKR (total knee replacement), left 03/17/2024   Osteoarthritis, knee 10/19/2023   Eye tearing 10/19/2023   Jaw pain 04/18/2023   Pes planus of both feet 01/16/2023   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) 01/16/2023   Baker's cyst 07/17/2022   Memory difficulty 07/17/2022   Cerebral microvascular disease 07/17/2022   ILD (interstitial lung disease) (HCC) 03/24/2022   Scarring of lung 11/09/2020   Aortic atherosclerosis (HCC) 11/09/2020   Impaired functional mobility, balance, gait, and endurance 09/22/2020   Seborrheic dermatitis 08/06/2020   Rash 08/06/2020   Bilateral leg edema 06/22/2020   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   Bronchiectasis without complication (HCC) 06/14/2020   Guillain Barr syndrome (HCC) 04/18/2020   Chronic diastolic (congestive) heart failure (HCC)    Coronary artery disease involving native coronary artery of native heart without angina pectoris    Cyst of left ovary 01/15/2019   History of gastroesophageal reflux (GERD) 12/12/2018   Postmenopausal bleeding 09/30/2018   Thickened endometrium 09/30/2018   Bilateral shoulder pain 09/20/2018   Second degree AV block, Mobitz type II 07/22/2018   Dyspnea  on exertion 03/21/2018   Greater trochanteric bursitis of both hips 12/18/2017   Psoriasis 09/27/2017   Prediabetes 09/19/2017   Stiffness of right hand joint 08/28/2017   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   Allergic rhinitis 06/14/2017   History of carotid stenosis 06/14/2017   Psoriatic arthritis (HCC) 06/14/2017   Lichen sclerosus    GERD (gastroesophageal reflux disease)    Hypertension 03/02/2017   Hyperlipidemia 03/02/2017   OSA (obstructive sleep apnea) 03/02/2017   Osteoporosis 03/02/2017    Orientation RESPIRATION BLADDER Height & Weight     Self, Time, Situation, Place  Normal Continent Weight: 82.1 kg Height:  5' 6 (167.6 cm)  BEHAVIORAL SYMPTOMS/MOOD NEUROLOGICAL BOWEL NUTRITION STATUS      Continent Diet (Regular diet)  AMBULATORY STATUS COMMUNICATION OF NEEDS Skin   Supervision Verbally Surgical wounds                       Personal Care Assistance Level of Assistance  Bathing, Feeding, Dressing Bathing Assistance: Independent Feeding assistance: Independent Dressing Assistance: Independent     Functional Limitations Info             SPECIAL CARE FACTORS FREQUENCY  PT (By licensed PT), OT (By licensed OT)     PT Frequency: 5 times per week OT Frequency: 5 times per week            Contractures Contractures Info: Not present    Additional Factors Info  Code Status, Allergies Code Status Info: Full code Allergies Info: Enbrel (etanercept), Codeine, Nickel  Current Medications (03/18/2024):  This is the current hospital active medication list Current Facility-Administered Medications  Medication Dose Route Frequency Provider Last Rate Last Admin   0.9 %  sodium chloride  infusion   Intravenous Continuous Aberman, Zachary, MD       acetaminophen  (TYLENOL ) tablet 1,000 mg  1,000 mg Oral Q8H Aberman, Zachary, MD       acetaminophen  (TYLENOL ) tablet 325-650 mg  325-650 mg Oral Q6H PRN Aberman, Zachary, MD       albuterol  (PROVENTIL )  (2.5 MG/3ML) 0.083% nebulizer solution 3 mL  3 mL Inhalation Q6H PRN Aberman, Zachary, MD       artificial tears ophthalmic solution 2 drop  2 drop Both Eyes PRN Madelynn Schilder, RPH       docusate sodium  (COLACE) capsule 100 mg  100 mg Oral BID Aberman, Zachary, MD   100 mg at 03/18/24 0951   enoxaparin  (LOVENOX ) injection 30 mg  30 mg Subcutaneous Q12H Aberman, Zachary, MD   30 mg at 03/18/24 0835   fluticasone furoate-vilanterol (BREO ELLIPTA) 200-25 MCG/ACT 1 puff  1 puff Inhalation Daily Aberman, Zachary, MD   1 puff at 03/18/24 1610   HYDROcodone -acetaminophen  (NORCO/VICODIN) 5-325 MG per tablet 1-2 tablet  1-2 tablet Oral Q4H PRN Aberman, Zachary, MD   2 tablet at 03/18/24 0054   ketorolac (TORADOL) 15 MG/ML injection 7.5 mg  7.5 mg Intravenous Q6H Aberman, Zachary, MD   7.5 mg at 03/18/24 9604   losartan  (COZAAR ) tablet 25 mg  25 mg Oral Daily Aberman, Zachary, MD   25 mg at 03/18/24 5409   menthol-cetylpyridinium (CEPACOL) lozenge 3 mg  1 lozenge Oral PRN Aberman, Zachary, MD       Or   phenol (CHLORASEPTIC) mouth spray 1 spray  1 spray Mouth/Throat PRN Aberman, Zachary, MD       metoCLOPramide (REGLAN) tablet 5-10 mg  5-10 mg Oral Q8H PRN Aberman, Zachary, MD       Or   metoCLOPramide (REGLAN) injection 5-10 mg  5-10 mg Intravenous Q8H PRN Aberman, Zachary, MD       morphine (PF) 2 MG/ML injection 0.5-1 mg  0.5-1 mg Intravenous Q2H PRN Aberman, Zachary, MD       ondansetron  (ZOFRAN ) tablet 4 mg  4 mg Oral Q6H PRN Aberman, Zachary, MD       Or   ondansetron  (ZOFRAN ) injection 4 mg  4 mg Intravenous Q6H PRN Aberman, Zachary, MD       pantoprazole  (PROTONIX ) EC tablet 40 mg  40 mg Oral Daily Aberman, Zachary, MD   40 mg at 03/18/24 8119   rosuvastatin  (CRESTOR ) tablet 20 mg  20 mg Oral QPM Aberman, Zachary, MD   20 mg at 03/17/24 1719   traMADol (ULTRAM) tablet 50 mg  50 mg Oral Q6H PRN Aberman, Zachary, MD         Discharge Medications: Please see discharge summary for a list of  discharge medications.  Relevant Imaging Results:  Relevant Lab Results:   Additional Information SS#: [id number]  Alexandra Ice, RN

## 2024-03-18 NOTE — Progress Notes (Signed)
 DISCHARGE NOTE:    Pt dc with IV removed and dc instructions given. Pt also give dc packet for [place] rehab facility as well as medication scripts. Pt received a RW to room. Pt has CPAP and both TED hose on and I place. Pt wheeled down to medical mall entrance by staff. Pt's family friend provided transportation to rehab facility.

## 2024-03-19 DIAGNOSIS — M1712 Unilateral primary osteoarthritis, left knee: Secondary | ICD-10-CM | POA: Diagnosis not present

## 2024-03-19 DIAGNOSIS — Z114 Encounter for screening for human immunodeficiency virus [HIV]: Secondary | ICD-10-CM | POA: Diagnosis not present

## 2024-03-19 DIAGNOSIS — Z1159 Encounter for screening for other viral diseases: Secondary | ICD-10-CM | POA: Diagnosis not present

## 2024-03-19 DIAGNOSIS — Z471 Aftercare following joint replacement surgery: Secondary | ICD-10-CM | POA: Diagnosis not present

## 2024-03-19 DIAGNOSIS — M6281 Muscle weakness (generalized): Secondary | ICD-10-CM | POA: Diagnosis not present

## 2024-03-19 DIAGNOSIS — J479 Bronchiectasis, uncomplicated: Secondary | ICD-10-CM | POA: Diagnosis not present

## 2024-03-19 DIAGNOSIS — M79605 Pain in left leg: Secondary | ICD-10-CM | POA: Diagnosis not present

## 2024-03-19 DIAGNOSIS — I2510 Atherosclerotic heart disease of native coronary artery without angina pectoris: Secondary | ICD-10-CM | POA: Diagnosis not present

## 2024-03-19 DIAGNOSIS — R278 Other lack of coordination: Secondary | ICD-10-CM | POA: Diagnosis not present

## 2024-03-20 DIAGNOSIS — M79605 Pain in left leg: Secondary | ICD-10-CM | POA: Diagnosis not present

## 2024-03-20 DIAGNOSIS — R278 Other lack of coordination: Secondary | ICD-10-CM | POA: Diagnosis not present

## 2024-03-20 DIAGNOSIS — Z471 Aftercare following joint replacement surgery: Secondary | ICD-10-CM | POA: Diagnosis not present

## 2024-03-20 DIAGNOSIS — M6281 Muscle weakness (generalized): Secondary | ICD-10-CM | POA: Diagnosis not present

## 2024-03-21 DIAGNOSIS — M6281 Muscle weakness (generalized): Secondary | ICD-10-CM | POA: Diagnosis not present

## 2024-03-21 DIAGNOSIS — M79605 Pain in left leg: Secondary | ICD-10-CM | POA: Diagnosis not present

## 2024-03-21 DIAGNOSIS — Z471 Aftercare following joint replacement surgery: Secondary | ICD-10-CM | POA: Diagnosis not present

## 2024-03-21 DIAGNOSIS — R278 Other lack of coordination: Secondary | ICD-10-CM | POA: Diagnosis not present

## 2024-03-24 DIAGNOSIS — Z471 Aftercare following joint replacement surgery: Secondary | ICD-10-CM | POA: Diagnosis not present

## 2024-03-24 DIAGNOSIS — R278 Other lack of coordination: Secondary | ICD-10-CM | POA: Diagnosis not present

## 2024-03-24 DIAGNOSIS — M6281 Muscle weakness (generalized): Secondary | ICD-10-CM | POA: Diagnosis not present

## 2024-03-24 DIAGNOSIS — M79605 Pain in left leg: Secondary | ICD-10-CM | POA: Diagnosis not present

## 2024-03-25 DIAGNOSIS — Z471 Aftercare following joint replacement surgery: Secondary | ICD-10-CM | POA: Diagnosis not present

## 2024-03-25 DIAGNOSIS — M6281 Muscle weakness (generalized): Secondary | ICD-10-CM | POA: Diagnosis not present

## 2024-03-25 DIAGNOSIS — R278 Other lack of coordination: Secondary | ICD-10-CM | POA: Diagnosis not present

## 2024-03-25 DIAGNOSIS — M79605 Pain in left leg: Secondary | ICD-10-CM | POA: Diagnosis not present

## 2024-03-26 DIAGNOSIS — M79605 Pain in left leg: Secondary | ICD-10-CM | POA: Diagnosis not present

## 2024-03-26 DIAGNOSIS — Z471 Aftercare following joint replacement surgery: Secondary | ICD-10-CM | POA: Diagnosis not present

## 2024-03-26 DIAGNOSIS — M6281 Muscle weakness (generalized): Secondary | ICD-10-CM | POA: Diagnosis not present

## 2024-03-26 DIAGNOSIS — R278 Other lack of coordination: Secondary | ICD-10-CM | POA: Diagnosis not present

## 2024-03-27 DIAGNOSIS — R278 Other lack of coordination: Secondary | ICD-10-CM | POA: Diagnosis not present

## 2024-03-27 DIAGNOSIS — M79605 Pain in left leg: Secondary | ICD-10-CM | POA: Diagnosis not present

## 2024-03-27 DIAGNOSIS — M6281 Muscle weakness (generalized): Secondary | ICD-10-CM | POA: Diagnosis not present

## 2024-03-27 DIAGNOSIS — Z471 Aftercare following joint replacement surgery: Secondary | ICD-10-CM | POA: Diagnosis not present

## 2024-03-27 DIAGNOSIS — L03116 Cellulitis of left lower limb: Secondary | ICD-10-CM | POA: Diagnosis not present

## 2024-03-28 DIAGNOSIS — M79605 Pain in left leg: Secondary | ICD-10-CM | POA: Diagnosis not present

## 2024-03-28 DIAGNOSIS — R278 Other lack of coordination: Secondary | ICD-10-CM | POA: Diagnosis not present

## 2024-03-28 DIAGNOSIS — Z471 Aftercare following joint replacement surgery: Secondary | ICD-10-CM | POA: Diagnosis not present

## 2024-03-28 DIAGNOSIS — M6281 Muscle weakness (generalized): Secondary | ICD-10-CM | POA: Diagnosis not present

## 2024-03-31 DIAGNOSIS — M79605 Pain in left leg: Secondary | ICD-10-CM | POA: Diagnosis not present

## 2024-03-31 DIAGNOSIS — I7091 Generalized atherosclerosis: Secondary | ICD-10-CM | POA: Diagnosis not present

## 2024-03-31 DIAGNOSIS — R278 Other lack of coordination: Secondary | ICD-10-CM | POA: Diagnosis not present

## 2024-03-31 DIAGNOSIS — B351 Tinea unguium: Secondary | ICD-10-CM | POA: Diagnosis not present

## 2024-03-31 DIAGNOSIS — M6281 Muscle weakness (generalized): Secondary | ICD-10-CM | POA: Diagnosis not present

## 2024-03-31 DIAGNOSIS — Z471 Aftercare following joint replacement surgery: Secondary | ICD-10-CM | POA: Diagnosis not present

## 2024-04-01 DIAGNOSIS — M79605 Pain in left leg: Secondary | ICD-10-CM | POA: Diagnosis not present

## 2024-04-01 DIAGNOSIS — M6281 Muscle weakness (generalized): Secondary | ICD-10-CM | POA: Diagnosis not present

## 2024-04-01 DIAGNOSIS — Z471 Aftercare following joint replacement surgery: Secondary | ICD-10-CM | POA: Diagnosis not present

## 2024-04-01 DIAGNOSIS — R278 Other lack of coordination: Secondary | ICD-10-CM | POA: Diagnosis not present

## 2024-04-02 DIAGNOSIS — M79605 Pain in left leg: Secondary | ICD-10-CM | POA: Diagnosis not present

## 2024-04-02 DIAGNOSIS — M6281 Muscle weakness (generalized): Secondary | ICD-10-CM | POA: Diagnosis not present

## 2024-04-02 DIAGNOSIS — R278 Other lack of coordination: Secondary | ICD-10-CM | POA: Diagnosis not present

## 2024-04-02 DIAGNOSIS — Z471 Aftercare following joint replacement surgery: Secondary | ICD-10-CM | POA: Diagnosis not present

## 2024-04-04 DIAGNOSIS — M6281 Muscle weakness (generalized): Secondary | ICD-10-CM | POA: Diagnosis not present

## 2024-04-04 DIAGNOSIS — M158 Other polyosteoarthritis: Secondary | ICD-10-CM | POA: Diagnosis not present

## 2024-04-04 DIAGNOSIS — R279 Unspecified lack of coordination: Secondary | ICD-10-CM | POA: Diagnosis not present

## 2024-04-04 DIAGNOSIS — R2689 Other abnormalities of gait and mobility: Secondary | ICD-10-CM | POA: Diagnosis not present

## 2024-04-07 DIAGNOSIS — R2689 Other abnormalities of gait and mobility: Secondary | ICD-10-CM | POA: Diagnosis not present

## 2024-04-07 DIAGNOSIS — R279 Unspecified lack of coordination: Secondary | ICD-10-CM | POA: Diagnosis not present

## 2024-04-07 DIAGNOSIS — M158 Other polyosteoarthritis: Secondary | ICD-10-CM | POA: Diagnosis not present

## 2024-04-07 DIAGNOSIS — M6281 Muscle weakness (generalized): Secondary | ICD-10-CM | POA: Diagnosis not present

## 2024-04-08 DIAGNOSIS — R2689 Other abnormalities of gait and mobility: Secondary | ICD-10-CM | POA: Diagnosis not present

## 2024-04-08 DIAGNOSIS — M6281 Muscle weakness (generalized): Secondary | ICD-10-CM | POA: Diagnosis not present

## 2024-04-08 DIAGNOSIS — R279 Unspecified lack of coordination: Secondary | ICD-10-CM | POA: Diagnosis not present

## 2024-04-08 DIAGNOSIS — M158 Other polyosteoarthritis: Secondary | ICD-10-CM | POA: Diagnosis not present

## 2024-04-09 ENCOUNTER — Ambulatory Visit: Payer: Medicare Other

## 2024-04-09 DIAGNOSIS — R279 Unspecified lack of coordination: Secondary | ICD-10-CM | POA: Diagnosis not present

## 2024-04-09 DIAGNOSIS — M158 Other polyosteoarthritis: Secondary | ICD-10-CM | POA: Diagnosis not present

## 2024-04-09 DIAGNOSIS — R2689 Other abnormalities of gait and mobility: Secondary | ICD-10-CM | POA: Diagnosis not present

## 2024-04-09 DIAGNOSIS — M6281 Muscle weakness (generalized): Secondary | ICD-10-CM | POA: Diagnosis not present

## 2024-04-09 DIAGNOSIS — I441 Atrioventricular block, second degree: Secondary | ICD-10-CM

## 2024-04-10 DIAGNOSIS — R279 Unspecified lack of coordination: Secondary | ICD-10-CM | POA: Diagnosis not present

## 2024-04-10 DIAGNOSIS — M158 Other polyosteoarthritis: Secondary | ICD-10-CM | POA: Diagnosis not present

## 2024-04-10 DIAGNOSIS — M6281 Muscle weakness (generalized): Secondary | ICD-10-CM | POA: Diagnosis not present

## 2024-04-10 DIAGNOSIS — R2689 Other abnormalities of gait and mobility: Secondary | ICD-10-CM | POA: Diagnosis not present

## 2024-04-10 LAB — CUP PACEART REMOTE DEVICE CHECK
Battery Remaining Longevity: 64 mo
Battery Remaining Percentage: 52 %
Battery Voltage: 2.99 V
Brady Statistic AP VP Percent: 6 %
Brady Statistic AP VS Percent: 1 %
Brady Statistic AS VP Percent: 90 %
Brady Statistic AS VS Percent: 2.4 %
Brady Statistic RA Percent Paced: 5.7 %
Brady Statistic RV Percent Paced: 96 %
Date Time Interrogation Session: 20250709020014
Implantable Lead Connection Status: 753985
Implantable Lead Connection Status: 753985
Implantable Lead Implant Date: 20191022
Implantable Lead Implant Date: 20191022
Implantable Lead Location: 753859
Implantable Lead Location: 753860
Implantable Pulse Generator Implant Date: 20191022
Lead Channel Impedance Value: 490 Ohm
Lead Channel Impedance Value: 630 Ohm
Lead Channel Pacing Threshold Amplitude: 0.5 V
Lead Channel Pacing Threshold Amplitude: 0.625 V
Lead Channel Pacing Threshold Pulse Width: 0.5 ms
Lead Channel Pacing Threshold Pulse Width: 0.5 ms
Lead Channel Sensing Intrinsic Amplitude: 10.6 mV
Lead Channel Sensing Intrinsic Amplitude: 3 mV
Lead Channel Setting Pacing Amplitude: 0.875
Lead Channel Setting Pacing Amplitude: 2 V
Lead Channel Setting Pacing Pulse Width: 0.5 ms
Lead Channel Setting Sensing Sensitivity: 2 mV
Pulse Gen Model: 2272
Pulse Gen Serial Number: 9073848

## 2024-04-11 DIAGNOSIS — M6281 Muscle weakness (generalized): Secondary | ICD-10-CM | POA: Diagnosis not present

## 2024-04-11 DIAGNOSIS — M158 Other polyosteoarthritis: Secondary | ICD-10-CM | POA: Diagnosis not present

## 2024-04-11 DIAGNOSIS — R2689 Other abnormalities of gait and mobility: Secondary | ICD-10-CM | POA: Diagnosis not present

## 2024-04-11 DIAGNOSIS — R279 Unspecified lack of coordination: Secondary | ICD-10-CM | POA: Diagnosis not present

## 2024-04-12 ENCOUNTER — Ambulatory Visit: Payer: Self-pay | Admitting: Cardiology

## 2024-04-14 DIAGNOSIS — M158 Other polyosteoarthritis: Secondary | ICD-10-CM | POA: Diagnosis not present

## 2024-04-14 DIAGNOSIS — R2689 Other abnormalities of gait and mobility: Secondary | ICD-10-CM | POA: Diagnosis not present

## 2024-04-14 DIAGNOSIS — M6281 Muscle weakness (generalized): Secondary | ICD-10-CM | POA: Diagnosis not present

## 2024-04-14 DIAGNOSIS — R279 Unspecified lack of coordination: Secondary | ICD-10-CM | POA: Diagnosis not present

## 2024-04-15 ENCOUNTER — Ambulatory Visit (INDEPENDENT_AMBULATORY_CARE_PROVIDER_SITE_OTHER): Admitting: Pulmonary Disease

## 2024-04-15 ENCOUNTER — Encounter: Payer: Self-pay | Admitting: Pulmonary Disease

## 2024-04-15 VITALS — BP 116/80 | HR 82 | Temp 98.2°F | Ht 66.0 in | Wt 180.4 lb

## 2024-04-15 DIAGNOSIS — Z8616 Personal history of COVID-19: Secondary | ICD-10-CM | POA: Diagnosis not present

## 2024-04-15 DIAGNOSIS — M158 Other polyosteoarthritis: Secondary | ICD-10-CM | POA: Diagnosis not present

## 2024-04-15 DIAGNOSIS — R2689 Other abnormalities of gait and mobility: Secondary | ICD-10-CM | POA: Diagnosis not present

## 2024-04-15 DIAGNOSIS — M6281 Muscle weakness (generalized): Secondary | ICD-10-CM | POA: Diagnosis not present

## 2024-04-15 DIAGNOSIS — G4733 Obstructive sleep apnea (adult) (pediatric): Secondary | ICD-10-CM | POA: Diagnosis not present

## 2024-04-15 DIAGNOSIS — R279 Unspecified lack of coordination: Secondary | ICD-10-CM | POA: Diagnosis not present

## 2024-04-15 NOTE — Addendum Note (Signed)
 Addended by: ROLANDA POWELL SAILOR on: 04/15/2024 10:04 AM   Modules accepted: Orders

## 2024-04-15 NOTE — Progress Notes (Signed)
 Synopsis: Referred in by Gretel App, NP   Subjective:   PATIENT ID: Alison Huffman GENDER: female DOB: [birth year], MRN: [id number]  Chief Complaint  Patient presents with   Follow-up    Needs new CPAP machine.    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  She was last seen by Dr. Shellia in 2024 and was managed for obstructive sleep apnea and was given the diagnosis of post-COVID dyspnea.  PFTs in March 2024 showed a borderline FVC, normal FEV1, increased FEV1 to FVC ratio.  Significant response to bronchodilator with FVC.  Normal lung volumes.  And normal diffusion capacity.  High-res CT in April 2024 with no significant interstitial lung disease but did show sequela of COVID-19 pneumonia.  OV 04/15/2024 - Alison Huffman is here to follow up on her [place] COVID (Reactive airway disease) and OSA. She is doing well overall. Reports that her breathing has improved on symbicort . She has been recovering from her TKR and been doing rehab daily. She is compliant with her CPAP at 100% with AHI 1.7.   ROS All systems were reviewed and are negative except for the above. Objective:   Vitals:   04/15/24 0931  BP: 116/80  Pulse: 82  Temp: 98.2 F (36.8 C)  TempSrc: Oral  SpO2: 97%  Weight: 180 lb 6.4 oz (81.8 kg)  Height: 5' 6 (1.676 m)   97% on RA BMI Readings from Last 3 Encounters:  04/15/24 29.12 kg/m  03/17/24 29.21 kg/m  03/06/24 29.21 kg/m   Wt Readings from Last 3 Encounters:  04/15/24 180 lb 6.4 oz (81.8 kg)  03/17/24 181 lb (82.1 kg)  03/06/24 181 lb (82.1 kg)    Physical Exam GEN:  NAD, Healthy Appearing HEENT: Supple Neck, Reactive Pupils, EOMI  CVS: Normal S1, Normal S2, RRR, No murmurs or ES appreciated  Lungs: Clear bilateral air entry.  Abdomen: Soft, non tender, non distended, + BS  Extremities: Warm and well perfused, No edema  Skin: No suspicious lesions appreciated  Psych: Normal Affect  Ancillary Information   CBC    Component Value Date/Time   WBC 10.0 03/18/2024 0615   RBC 3.56 (L) 03/18/2024 0615   HGB 11.2 (L) 03/18/2024 0615   HCT 33.8 (L) 03/18/2024 0615   PLT 179 03/18/2024 0615   MCV 94.9 03/18/2024 0615   MCH 31.5 03/18/2024 0615   MCHC 33.1 03/18/2024 0615   RDW 12.9 03/18/2024 0615   LYMPHSABS 1.4 03/06/2024 0945   MONOABS 0.5 03/06/2024 0945   EOSABS 0.2 03/06/2024 0945   BASOSABS 0.0 03/06/2024 0945   Labs and imaging were reviewed.    Latest Ref Rng & Units 12/26/2022    9:41 AM 02/22/2022    2:36 PM  PFT Results  FVC-Pre L 1.99  2.42   FVC-Predicted Pre % 73  87   FVC-Post L 2.25  2.36   FVC-Predicted Post % 82  85   Pre FEV1/FVC % % 95  87   Post FEV1/FCV % % 85  91   FEV1-Pre L 1.90  2.10   FEV1-Predicted Pre % 93  101   FEV1-Post L 1.92  2.14   DLCO  uncorrected ml/min/mmHg 14.23  13.36   DLCO UNC% % 71  66   DLVA Predicted % 87  89   TLC L 4.56  3.93   TLC % Predicted % 85  73   RV % Predicted % 81  60      Assessment & Plan:  Alison Huffman is a pleasant 83 year old female patient with a past medical history of psoriatic arthritis, OSA on CPAP, right lower lobe bronchiectasis, second-degree heart block status post pacemaker, and a 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  []  c/w budesonide -formoterol  [Symbicort ] 160-4.52 puffs twice a day.  AeroChamber was sent as well. []  Continue  with albuterol  on an as-needed basis.  #OSA on CPAP Good compliance at 100%.  Sleeps 8 hours a night.  AHI 1.7.  Continue with auto CPAP 5 to 15 cm water . Eligible for a new machine.   RTC 6 months.  I spent 30 minutes caring for this patient today, including preparing to see the patient, obtaining a medical history , reviewing a separately obtained history, performing a medically appropriate examination and/or evaluation, counseling and educating the patient/family/caregiver, ordering medications, tests, or procedures, documenting clinical information in the electronic health record, and independently interpreting results (not separately reported/billed) and communicating results to the patient/family/caregiver  Darrin Barn, MD [facility] 04/15/2024 9:42 AM

## 2024-04-16 DIAGNOSIS — M6281 Muscle weakness (generalized): Secondary | ICD-10-CM | POA: Diagnosis not present

## 2024-04-16 DIAGNOSIS — R2689 Other abnormalities of gait and mobility: Secondary | ICD-10-CM | POA: Diagnosis not present

## 2024-04-16 DIAGNOSIS — R279 Unspecified lack of coordination: Secondary | ICD-10-CM | POA: Diagnosis not present

## 2024-04-16 DIAGNOSIS — M158 Other polyosteoarthritis: Secondary | ICD-10-CM | POA: Diagnosis not present

## 2024-04-17 DIAGNOSIS — M158 Other polyosteoarthritis: Secondary | ICD-10-CM | POA: Diagnosis not present

## 2024-04-17 DIAGNOSIS — R2689 Other abnormalities of gait and mobility: Secondary | ICD-10-CM | POA: Diagnosis not present

## 2024-04-17 DIAGNOSIS — M6281 Muscle weakness (generalized): Secondary | ICD-10-CM | POA: Diagnosis not present

## 2024-04-17 DIAGNOSIS — R279 Unspecified lack of coordination: Secondary | ICD-10-CM | POA: Diagnosis not present

## 2024-04-18 DIAGNOSIS — R2689 Other abnormalities of gait and mobility: Secondary | ICD-10-CM | POA: Diagnosis not present

## 2024-04-18 DIAGNOSIS — M158 Other polyosteoarthritis: Secondary | ICD-10-CM | POA: Diagnosis not present

## 2024-04-18 DIAGNOSIS — R279 Unspecified lack of coordination: Secondary | ICD-10-CM | POA: Diagnosis not present

## 2024-04-18 DIAGNOSIS — M6281 Muscle weakness (generalized): Secondary | ICD-10-CM | POA: Diagnosis not present

## 2024-04-21 ENCOUNTER — Ambulatory Visit: Admitting: Podiatry

## 2024-04-21 DIAGNOSIS — M158 Other polyosteoarthritis: Secondary | ICD-10-CM | POA: Diagnosis not present

## 2024-04-21 DIAGNOSIS — R2689 Other abnormalities of gait and mobility: Secondary | ICD-10-CM | POA: Diagnosis not present

## 2024-04-21 DIAGNOSIS — R279 Unspecified lack of coordination: Secondary | ICD-10-CM | POA: Diagnosis not present

## 2024-04-21 DIAGNOSIS — M6281 Muscle weakness (generalized): Secondary | ICD-10-CM | POA: Diagnosis not present

## 2024-04-22 DIAGNOSIS — M158 Other polyosteoarthritis: Secondary | ICD-10-CM | POA: Diagnosis not present

## 2024-04-22 DIAGNOSIS — R279 Unspecified lack of coordination: Secondary | ICD-10-CM | POA: Diagnosis not present

## 2024-04-22 DIAGNOSIS — M6281 Muscle weakness (generalized): Secondary | ICD-10-CM | POA: Diagnosis not present

## 2024-04-22 DIAGNOSIS — R2689 Other abnormalities of gait and mobility: Secondary | ICD-10-CM | POA: Diagnosis not present

## 2024-04-23 DIAGNOSIS — R2689 Other abnormalities of gait and mobility: Secondary | ICD-10-CM | POA: Diagnosis not present

## 2024-04-23 DIAGNOSIS — M6281 Muscle weakness (generalized): Secondary | ICD-10-CM | POA: Diagnosis not present

## 2024-04-23 DIAGNOSIS — R279 Unspecified lack of coordination: Secondary | ICD-10-CM | POA: Diagnosis not present

## 2024-04-23 DIAGNOSIS — M158 Other polyosteoarthritis: Secondary | ICD-10-CM | POA: Diagnosis not present

## 2024-04-24 DIAGNOSIS — R2689 Other abnormalities of gait and mobility: Secondary | ICD-10-CM | POA: Diagnosis not present

## 2024-04-24 DIAGNOSIS — M6281 Muscle weakness (generalized): Secondary | ICD-10-CM | POA: Diagnosis not present

## 2024-04-24 DIAGNOSIS — M158 Other polyosteoarthritis: Secondary | ICD-10-CM | POA: Diagnosis not present

## 2024-04-24 DIAGNOSIS — R279 Unspecified lack of coordination: Secondary | ICD-10-CM | POA: Diagnosis not present

## 2024-04-25 DIAGNOSIS — M6281 Muscle weakness (generalized): Secondary | ICD-10-CM | POA: Diagnosis not present

## 2024-04-25 DIAGNOSIS — M158 Other polyosteoarthritis: Secondary | ICD-10-CM | POA: Diagnosis not present

## 2024-04-25 DIAGNOSIS — R2689 Other abnormalities of gait and mobility: Secondary | ICD-10-CM | POA: Diagnosis not present

## 2024-04-25 DIAGNOSIS — R279 Unspecified lack of coordination: Secondary | ICD-10-CM | POA: Diagnosis not present

## 2024-04-28 DIAGNOSIS — M158 Other polyosteoarthritis: Secondary | ICD-10-CM | POA: Diagnosis not present

## 2024-04-28 DIAGNOSIS — R2689 Other abnormalities of gait and mobility: Secondary | ICD-10-CM | POA: Diagnosis not present

## 2024-04-28 DIAGNOSIS — M6281 Muscle weakness (generalized): Secondary | ICD-10-CM | POA: Diagnosis not present

## 2024-04-28 DIAGNOSIS — R279 Unspecified lack of coordination: Secondary | ICD-10-CM | POA: Diagnosis not present

## 2024-04-29 DIAGNOSIS — R2689 Other abnormalities of gait and mobility: Secondary | ICD-10-CM | POA: Diagnosis not present

## 2024-04-29 DIAGNOSIS — R279 Unspecified lack of coordination: Secondary | ICD-10-CM | POA: Diagnosis not present

## 2024-04-29 DIAGNOSIS — M158 Other polyosteoarthritis: Secondary | ICD-10-CM | POA: Diagnosis not present

## 2024-04-29 DIAGNOSIS — M6281 Muscle weakness (generalized): Secondary | ICD-10-CM | POA: Diagnosis not present

## 2024-04-29 DIAGNOSIS — Z96652 Presence of left artificial knee joint: Secondary | ICD-10-CM | POA: Diagnosis not present

## 2024-04-30 DIAGNOSIS — R2689 Other abnormalities of gait and mobility: Secondary | ICD-10-CM | POA: Diagnosis not present

## 2024-04-30 DIAGNOSIS — R279 Unspecified lack of coordination: Secondary | ICD-10-CM | POA: Diagnosis not present

## 2024-04-30 DIAGNOSIS — M158 Other polyosteoarthritis: Secondary | ICD-10-CM | POA: Diagnosis not present

## 2024-04-30 DIAGNOSIS — M6281 Muscle weakness (generalized): Secondary | ICD-10-CM | POA: Diagnosis not present

## 2024-05-01 DIAGNOSIS — R2689 Other abnormalities of gait and mobility: Secondary | ICD-10-CM | POA: Diagnosis not present

## 2024-05-01 DIAGNOSIS — R279 Unspecified lack of coordination: Secondary | ICD-10-CM | POA: Diagnosis not present

## 2024-05-01 DIAGNOSIS — M6281 Muscle weakness (generalized): Secondary | ICD-10-CM | POA: Diagnosis not present

## 2024-05-01 DIAGNOSIS — M158 Other polyosteoarthritis: Secondary | ICD-10-CM | POA: Diagnosis not present

## 2024-05-05 DIAGNOSIS — M6281 Muscle weakness (generalized): Secondary | ICD-10-CM | POA: Diagnosis not present

## 2024-05-05 DIAGNOSIS — M158 Other polyosteoarthritis: Secondary | ICD-10-CM | POA: Diagnosis not present

## 2024-05-05 DIAGNOSIS — R279 Unspecified lack of coordination: Secondary | ICD-10-CM | POA: Diagnosis not present

## 2024-05-05 DIAGNOSIS — R2689 Other abnormalities of gait and mobility: Secondary | ICD-10-CM | POA: Diagnosis not present

## 2024-05-06 DIAGNOSIS — M158 Other polyosteoarthritis: Secondary | ICD-10-CM | POA: Diagnosis not present

## 2024-05-06 DIAGNOSIS — R2689 Other abnormalities of gait and mobility: Secondary | ICD-10-CM | POA: Diagnosis not present

## 2024-05-06 DIAGNOSIS — M6281 Muscle weakness (generalized): Secondary | ICD-10-CM | POA: Diagnosis not present

## 2024-05-06 DIAGNOSIS — R279 Unspecified lack of coordination: Secondary | ICD-10-CM | POA: Diagnosis not present

## 2024-05-07 DIAGNOSIS — R279 Unspecified lack of coordination: Secondary | ICD-10-CM | POA: Diagnosis not present

## 2024-05-07 DIAGNOSIS — R2689 Other abnormalities of gait and mobility: Secondary | ICD-10-CM | POA: Diagnosis not present

## 2024-05-07 DIAGNOSIS — M158 Other polyosteoarthritis: Secondary | ICD-10-CM | POA: Diagnosis not present

## 2024-05-07 DIAGNOSIS — M6281 Muscle weakness (generalized): Secondary | ICD-10-CM | POA: Diagnosis not present

## 2024-05-08 DIAGNOSIS — M158 Other polyosteoarthritis: Secondary | ICD-10-CM | POA: Diagnosis not present

## 2024-05-08 DIAGNOSIS — R279 Unspecified lack of coordination: Secondary | ICD-10-CM | POA: Diagnosis not present

## 2024-05-08 DIAGNOSIS — M6281 Muscle weakness (generalized): Secondary | ICD-10-CM | POA: Diagnosis not present

## 2024-05-08 DIAGNOSIS — R2689 Other abnormalities of gait and mobility: Secondary | ICD-10-CM | POA: Diagnosis not present

## 2024-05-09 DIAGNOSIS — M158 Other polyosteoarthritis: Secondary | ICD-10-CM | POA: Diagnosis not present

## 2024-05-09 DIAGNOSIS — M6281 Muscle weakness (generalized): Secondary | ICD-10-CM | POA: Diagnosis not present

## 2024-05-09 DIAGNOSIS — R279 Unspecified lack of coordination: Secondary | ICD-10-CM | POA: Diagnosis not present

## 2024-05-09 DIAGNOSIS — R2689 Other abnormalities of gait and mobility: Secondary | ICD-10-CM | POA: Diagnosis not present

## 2024-05-12 DIAGNOSIS — R2689 Other abnormalities of gait and mobility: Secondary | ICD-10-CM | POA: Diagnosis not present

## 2024-05-12 DIAGNOSIS — M158 Other polyosteoarthritis: Secondary | ICD-10-CM | POA: Diagnosis not present

## 2024-05-12 DIAGNOSIS — M6281 Muscle weakness (generalized): Secondary | ICD-10-CM | POA: Diagnosis not present

## 2024-05-12 DIAGNOSIS — R279 Unspecified lack of coordination: Secondary | ICD-10-CM | POA: Diagnosis not present

## 2024-05-13 DIAGNOSIS — M6281 Muscle weakness (generalized): Secondary | ICD-10-CM | POA: Diagnosis not present

## 2024-05-13 DIAGNOSIS — R279 Unspecified lack of coordination: Secondary | ICD-10-CM | POA: Diagnosis not present

## 2024-05-13 DIAGNOSIS — M158 Other polyosteoarthritis: Secondary | ICD-10-CM | POA: Diagnosis not present

## 2024-05-13 DIAGNOSIS — R2689 Other abnormalities of gait and mobility: Secondary | ICD-10-CM | POA: Diagnosis not present

## 2024-05-15 DIAGNOSIS — M6281 Muscle weakness (generalized): Secondary | ICD-10-CM | POA: Diagnosis not present

## 2024-05-15 DIAGNOSIS — R279 Unspecified lack of coordination: Secondary | ICD-10-CM | POA: Diagnosis not present

## 2024-05-15 DIAGNOSIS — R2689 Other abnormalities of gait and mobility: Secondary | ICD-10-CM | POA: Diagnosis not present

## 2024-05-15 DIAGNOSIS — M158 Other polyosteoarthritis: Secondary | ICD-10-CM | POA: Diagnosis not present

## 2024-05-16 DIAGNOSIS — M6281 Muscle weakness (generalized): Secondary | ICD-10-CM | POA: Diagnosis not present

## 2024-05-16 DIAGNOSIS — R279 Unspecified lack of coordination: Secondary | ICD-10-CM | POA: Diagnosis not present

## 2024-05-16 DIAGNOSIS — M158 Other polyosteoarthritis: Secondary | ICD-10-CM | POA: Diagnosis not present

## 2024-05-16 DIAGNOSIS — R2689 Other abnormalities of gait and mobility: Secondary | ICD-10-CM | POA: Diagnosis not present

## 2024-05-19 ENCOUNTER — Telehealth: Payer: Self-pay

## 2024-05-19 DIAGNOSIS — R279 Unspecified lack of coordination: Secondary | ICD-10-CM | POA: Diagnosis not present

## 2024-05-19 DIAGNOSIS — M6281 Muscle weakness (generalized): Secondary | ICD-10-CM | POA: Diagnosis not present

## 2024-05-19 DIAGNOSIS — R2689 Other abnormalities of gait and mobility: Secondary | ICD-10-CM | POA: Diagnosis not present

## 2024-05-19 DIAGNOSIS — M158 Other polyosteoarthritis: Secondary | ICD-10-CM | POA: Diagnosis not present

## 2024-05-19 NOTE — Telephone Encounter (Signed)
 Physical therapy Plan of Treatment forms needing provider signature placed in to be signed folder

## 2024-05-20 ENCOUNTER — Ambulatory Visit (INDEPENDENT_AMBULATORY_CARE_PROVIDER_SITE_OTHER): Payer: Medicare Other | Admitting: Neurology

## 2024-05-20 ENCOUNTER — Encounter: Payer: Self-pay | Admitting: Neurology

## 2024-05-20 VITALS — BP 156/73 | HR 65 | Ht 66.0 in | Wt 181.0 lb

## 2024-05-20 DIAGNOSIS — R279 Unspecified lack of coordination: Secondary | ICD-10-CM | POA: Diagnosis not present

## 2024-05-20 DIAGNOSIS — R413 Other amnesia: Secondary | ICD-10-CM | POA: Diagnosis not present

## 2024-05-20 DIAGNOSIS — M6281 Muscle weakness (generalized): Secondary | ICD-10-CM | POA: Diagnosis not present

## 2024-05-20 DIAGNOSIS — G610 Guillain-Barre syndrome: Secondary | ICD-10-CM | POA: Diagnosis not present

## 2024-05-20 DIAGNOSIS — R2689 Other abnormalities of gait and mobility: Secondary | ICD-10-CM | POA: Diagnosis not present

## 2024-05-20 DIAGNOSIS — M158 Other polyosteoarthritis: Secondary | ICD-10-CM | POA: Diagnosis not present

## 2024-05-20 NOTE — Progress Notes (Signed)
 Follow-up Visit   Date: 05/20/24   Alison Huffman MRN: [id number] DOB: April 05, 1941    Alison Huffman is a 83 y.o. right-handed Caucasian female with cardiac arrythmia s/p pacemaker, psoratic arthritis, hypertension, OSA, GERD, and hyperlipidemia returning to the clinic for follow-up of Guillain Barre Syndrome.  The patient was accompanied to the clinic by self.   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 Stable.  Treated with IVIG (no response) and then plasmapheresis which she has great response. She continues to have mild numbness in the feet, which are most likely lasting deficits.    Mild cognitive changes, amnestic.  She remains highly independent with ADLs and IADLs.  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 MRI brain from 2023 did not show findings of raised ICP.  She is followed by ophthalmology.   Right vocal cord paralysis, secondary to intubation  Return to clinic in 1 year  --------------------------------------------------------------- History of present illness: She presented to [facility] in June and transferred to [name] with ascending paresthesias, proximal weakness (3/5 hip flexion, 3/5 shoulder extension), dysphagia, and respiratory failure about a week following treatment with Enbrel. She had a complex hospital course with  hyponatremia-induced (Na 119) seizure, respiratory failure requiring resuscitation, bilateral pneumothorax, and right vocal cord paralysis.  Work-up included CSF testing which did not show albuminocytologic dissociation, but given high clinical suspicion was treated for Guillain Barre syndrome with IVIG but did not respond.  She responded to plasmapheresis x 7 session. MRI brain, cervical spine, and thoracic spine was unremarkable.  She slowly improved and discharged to rehab then SNF, where she continued to make  improvements.  She is now walking with a rollator/cane and able to walk a few steps unassisted.  No dysarthria, dysphagia, double vision, or limb weakness.  She still have numbness/tingling in the lower legs/feet. She is now walking with a rollator and able to take steps unassisted.  No falls.   UPDATE 12/03/2020:  She is here for follow-up visit.  She has NCS/EMG in December which was consistent with chronic demyelinating and axonal neuropathy.  She has been very compliant with PT/OT and made significant improvement.  She is no longer using a walker and able to walk unassisted at home, and uses a cone for long distances.  Numbness remains in the distal feet. No new complaints  UPDATE 05/18/2021:  She is here for 6 month visit.  She has been doing great from a neurological stand point.  She only is aware of numbness/tingling in the toes when she flexes her toes.   Most of the time, she is unaware. No new weakness or falls.  She walks unassisted. She tries to be compliant with her PT exercises.  She continues to have shortness of breath and will be seeing cardiology.  She received COVID booster in the Spring and tolerated it well.   UPDATE 05/19/2022: There has been no change to the numbness/tingling in her feet.  She had one fall in July with superficial injuries.  She had a headache after her fall, which has improved now.  No recent headache.  Last fall, she developed a cloud in the vision of her right and found to have optic edema involving the right eye.  She has a permanent blind spot.  She had not had brain imaging.  She reports having this involving the left eye in the past when she was younger and saw  neurophthalmology.   UPDATE 05/21/2023:  She is here for follow-up visit.  She continues to have numbness from the ankles into the feet.  Balance is fair.  No interval falls.  She uses a cane as needed.  In November, she had COVID.  She has noticed word-finding difficulty and forgetting names.  She  continues to perform alls ADLs and ADLs.    UPDATE 05/20/2024: She is here for 1 year follow-up visit.  She had left TKA in June and has been using a cane since this, but is doing PT and trying to get back to walking independently.  There has been no change in her cognition, she remains highly independent with all ADLs and IADLs.  Numbness in the feet is unchanged. No new neurological complaints.   Medications:  Current Outpatient Medications on File Prior to Visit  Medication Sig Dispense Refill   acetaminophen  (TYLENOL ) 500 MG tablet Take 2 tablets (1,000 mg total) by mouth every 8 (eight) hours. 30 tablet 0   albuterol  (VENTOLIN  HFA) 108 (90 Base) MCG/ACT inhaler Inhale 2 puffs into the lungs every 6 (six) hours as needed for wheezing or shortness of breath. 8 g 2   aspirin EC 81 MG tablet Take 81 mg by mouth in the morning and at bedtime. Swallow whole.     budesonide -formoterol  (SYMBICORT ) 160-4.5 MCG/ACT inhaler Inhale 2 puffs into the lungs in the morning and at bedtime. 1 each 12   carboxymethylcellulose (REFRESH PLUS) 0.5 % SOLN Place 1-2 drops into both eyes in the morning, at noon, and at bedtime.     clobetasol  ointment (TEMOVATE ) 0.05 % Apply 1 Application topically once a week. Lichen sclerosus     docusate sodium  (COLACE) 100 MG capsule Take 1 capsule (100 mg total) by mouth 2 (two) times daily. 10 capsule 0   furosemide  (LASIX ) 20 MG tablet TAKE ONE TABLET BY MOUTH EVERY DAY AS NEEDED FOR EDEMA 30 tablet 3   losartan  (COZAAR ) 25 MG tablet Take 1 tablet (25 mg total) by mouth daily. 90 tablet 1   rosuvastatin  (CRESTOR ) 20 MG tablet Take 1 tablet (20 mg total) by mouth daily. 90 tablet 3   Spacer/Aero-Holding Chambers (AEROCHAMBER MV) inhaler Use as instructed 1 each 0   tacrolimus  (PROTOPIC ) 0.1 % ointment Apply 1 Application topically 2 (two) times daily as needed (for psoriasis).     Vitamin D , Ergocalciferol , 50 MCG (2000 UT) CAPS Take 2,000 Units by mouth in the morning.      ondansetron  (ZOFRAN ) 4 MG tablet Take 1 tablet (4 mg total) by mouth every 6 (six) hours as needed for nausea. 20 tablet 0   oxyCODONE  (ROXICODONE ) 5 MG immediate release tablet Take 0.5 tablets (2.5 mg total) by mouth every 8 (eight) hours as needed for breakthrough pain. 20 tablet 0   traMADol  (ULTRAM ) 50 MG tablet Take 1 tablet (50 mg total) by mouth every 6 (six) hours as needed for moderate pain (pain score 4-6). (Patient not taking: Reported on 05/20/2024) 30 tablet 0   No current facility-administered medications on file prior to visit.    Allergies:  Allergies  Allergen Reactions   Enbrel [Etanercept] Other (See Comments)    Guillain Baree   Codeine Other (See Comments)    Stomach ache   Nickel     Vital Signs:  BP (!) 156/73   Pulse 65   Ht 5' 6 (1.676 m)   Wt 181 lb (82.1 kg)   SpO2 97%   BMI 29.21 kg/m  Neurological Exam: MENTAL STATUS including orientation to time, place, person, recent and remote memory, attention span and concentration, language, and fund of knowledge is normal.  Speech is not dysarthric, mild hoarseness.  CRANIAL NERVES:  Pupils equal round and reactive to light.  Normal conjugate, extra-ocular eye movements in all directions of gaze.  Mild left ptosis.  Face is symmetric.  MOTOR:  Motor strength is 5/5 in all extremities, including distally.  No pronator drift.  Tone is normal.    MSRs:                                           Right        Left brachioradialis 2+  2+  biceps 2+  2+  triceps 2+  2+  patellar 1+  1+  ankle jerk 0  0   SENSORY:  Reduced vibration at the ankles, worse on the left.   COORDINATION/GAIT:    Gait mildly wide-based stable, assisted with cane.   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  MRI brain wwo contrast 07/11/2022: No acute or reversible finding. Moderate chronic small-vessel ischemic changes of the pons and cerebral hemispheric white matter, slightly progressive since 2021. No imaging finding of increased intracranial pressure.   Lab Results  Component Value Date   TSH 1.39 05/21/2023   Lab Results  Component Value Date   VITAMINB12 373 05/21/2023     Thank you for allowing me to participate in patient's care.  If I can answer any additional questions, I would be pleased to do so.    Sincerely,    Jabez Molner K. Tobie, DO

## 2024-05-20 NOTE — Telephone Encounter (Signed)
 Form faxed to [phone] with a completed transmission log

## 2024-05-21 ENCOUNTER — Telehealth: Payer: Self-pay

## 2024-05-21 DIAGNOSIS — M158 Other polyosteoarthritis: Secondary | ICD-10-CM | POA: Diagnosis not present

## 2024-05-21 DIAGNOSIS — R2689 Other abnormalities of gait and mobility: Secondary | ICD-10-CM | POA: Diagnosis not present

## 2024-05-21 DIAGNOSIS — M6281 Muscle weakness (generalized): Secondary | ICD-10-CM | POA: Diagnosis not present

## 2024-05-21 DIAGNOSIS — R279 Unspecified lack of coordination: Secondary | ICD-10-CM | POA: Diagnosis not present

## 2024-05-21 NOTE — Telephone Encounter (Signed)
 Forms placed in provider to be signed folder

## 2024-05-22 DIAGNOSIS — R279 Unspecified lack of coordination: Secondary | ICD-10-CM | POA: Diagnosis not present

## 2024-05-22 DIAGNOSIS — M6281 Muscle weakness (generalized): Secondary | ICD-10-CM | POA: Diagnosis not present

## 2024-05-22 DIAGNOSIS — R2689 Other abnormalities of gait and mobility: Secondary | ICD-10-CM | POA: Diagnosis not present

## 2024-05-22 DIAGNOSIS — M158 Other polyosteoarthritis: Secondary | ICD-10-CM | POA: Diagnosis not present

## 2024-05-23 DIAGNOSIS — M6281 Muscle weakness (generalized): Secondary | ICD-10-CM | POA: Diagnosis not present

## 2024-05-23 DIAGNOSIS — R2689 Other abnormalities of gait and mobility: Secondary | ICD-10-CM | POA: Diagnosis not present

## 2024-05-23 DIAGNOSIS — R279 Unspecified lack of coordination: Secondary | ICD-10-CM | POA: Diagnosis not present

## 2024-05-23 DIAGNOSIS — M158 Other polyosteoarthritis: Secondary | ICD-10-CM | POA: Diagnosis not present

## 2024-05-23 NOTE — Telephone Encounter (Signed)
 Form faxed to [phone] with a completed transmission log

## 2024-05-26 DIAGNOSIS — M158 Other polyosteoarthritis: Secondary | ICD-10-CM | POA: Diagnosis not present

## 2024-05-26 DIAGNOSIS — R2689 Other abnormalities of gait and mobility: Secondary | ICD-10-CM | POA: Diagnosis not present

## 2024-05-26 DIAGNOSIS — M6281 Muscle weakness (generalized): Secondary | ICD-10-CM | POA: Diagnosis not present

## 2024-05-26 DIAGNOSIS — R279 Unspecified lack of coordination: Secondary | ICD-10-CM | POA: Diagnosis not present

## 2024-05-28 ENCOUNTER — Ambulatory Visit: Payer: Self-pay | Admitting: Nurse Practitioner

## 2024-05-28 ENCOUNTER — Encounter: Payer: Self-pay | Admitting: Nurse Practitioner

## 2024-05-28 ENCOUNTER — Ambulatory Visit (INDEPENDENT_AMBULATORY_CARE_PROVIDER_SITE_OTHER): Payer: Medicare Other | Admitting: Nurse Practitioner

## 2024-05-28 VITALS — BP 134/72 | HR 65 | Temp 98.0°F | Ht 66.0 in | Wt 178.6 lb

## 2024-05-28 DIAGNOSIS — E782 Mixed hyperlipidemia: Secondary | ICD-10-CM | POA: Diagnosis not present

## 2024-05-28 DIAGNOSIS — J849 Interstitial pulmonary disease, unspecified: Secondary | ICD-10-CM

## 2024-05-28 DIAGNOSIS — R7303 Prediabetes: Secondary | ICD-10-CM | POA: Diagnosis not present

## 2024-05-28 DIAGNOSIS — M6281 Muscle weakness (generalized): Secondary | ICD-10-CM | POA: Diagnosis not present

## 2024-05-28 DIAGNOSIS — R279 Unspecified lack of coordination: Secondary | ICD-10-CM | POA: Diagnosis not present

## 2024-05-28 DIAGNOSIS — Z96652 Presence of left artificial knee joint: Secondary | ICD-10-CM

## 2024-05-28 DIAGNOSIS — R2689 Other abnormalities of gait and mobility: Secondary | ICD-10-CM | POA: Diagnosis not present

## 2024-05-28 DIAGNOSIS — M158 Other polyosteoarthritis: Secondary | ICD-10-CM | POA: Diagnosis not present

## 2024-05-28 DIAGNOSIS — G610 Guillain-Barre syndrome: Secondary | ICD-10-CM | POA: Diagnosis not present

## 2024-05-28 DIAGNOSIS — I10 Essential (primary) hypertension: Secondary | ICD-10-CM

## 2024-05-28 LAB — CBC WITH DIFFERENTIAL/PLATELET
Basophils Absolute: 0 K/uL (ref 0.0–0.1)
Basophils Relative: 0.8 % (ref 0.0–3.0)
Eosinophils Absolute: 0.2 K/uL (ref 0.0–0.7)
Eosinophils Relative: 3.2 % (ref 0.0–5.0)
HCT: 39.5 % (ref 36.0–46.0)
Hemoglobin: 12.9 g/dL (ref 12.0–15.0)
Lymphocytes Relative: 25.1 % (ref 12.0–46.0)
Lymphs Abs: 1.2 K/uL (ref 0.7–4.0)
MCHC: 32.6 g/dL (ref 30.0–36.0)
MCV: 94.1 fl (ref 78.0–100.0)
Monocytes Absolute: 0.4 K/uL (ref 0.1–1.0)
Monocytes Relative: 9 % (ref 3.0–12.0)
Neutro Abs: 2.9 K/uL (ref 1.4–7.7)
Neutrophils Relative %: 61.9 % (ref 43.0–77.0)
Platelets: 216 K/uL (ref 150.0–400.0)
RBC: 4.19 Mil/uL (ref 3.87–5.11)
RDW: 13.1 % (ref 11.5–15.5)
WBC: 4.7 K/uL (ref 4.0–10.5)

## 2024-05-28 LAB — BASIC METABOLIC PANEL WITH GFR
BUN: 13 mg/dL (ref 6–23)
CO2: 27 meq/L (ref 19–32)
Calcium: 9.3 mg/dL (ref 8.4–10.5)
Chloride: 105 meq/L (ref 96–112)
Creatinine, Ser: 0.55 mg/dL (ref 0.40–1.20)
GFR: 84.09 mL/min (ref >60.00)
Glucose, Bld: 90 mg/dL (ref 70–99)
Potassium: 4.4 meq/L (ref 3.5–5.1)
Sodium: 140 meq/L (ref 135–145)

## 2024-05-28 LAB — HEMOGLOBIN A1C: Hgb A1c MFr Bld: 6.1 % (ref 4.6–6.5)

## 2024-05-28 MED ORDER — LOSARTAN POTASSIUM 25 MG PO TABS: 25.0000 mg | ORAL_TABLET | Freq: Every day | ORAL | 3 refills | Status: DC

## 2024-05-28 NOTE — Progress Notes (Signed)
 Leron Glance, NP-C Phone: [phone]  Alison Huffman is a 83 y.o. female who presents today for follow up.   Discussed the use of AI scribe software for clinical note transcription with the patient, who gave verbal consent to proceed.  History of Present Illness   Alison Huffman is an 83 year old female who presents for a six-month follow-up.  She underwent a left knee replacement in June and is progressing well in her recovery. She is beginning to walk without assistance but still uses a cane for longer distances. Pain is minimal and managed with Tylenol  as needed.  Post-operatively, her hemoglobin was noted to be low, and she is interested in checking on this. She is also concerned about her irregular heart rate and the absence of blood thinner therapy, especially with upcoming travel plans. She has a pacemaker and is unsure about the need for blood thinners. She has not seen her cardiologist recently and does not recall the last appointment date.  She has a history of pulmonary issues and uses inhalers as prescribed by her pulmonologist. She experiences shortness of breath, which has not worsened but persists, particularly when walking faster. She also reports some voice changes, which she believes may have worsened post-surgery, possibly due to intubation.  She takes Crestor  for cholesterol management and reports no new issues from her recent neurology visit for Guillain-Barre syndrome, which was diagnosed in 2021. She monitors her blood pressure at home, which typically ranges from 120-130/50-60 mmHg, although she has not checked it in the past month or two. No chest pain, dizziness, or new swelling, aside from the knee.      Social History   Tobacco Use  Smoking Status Never  Smokeless Tobacco Never  Tobacco Comments   07/22/2018 smoked some in college; 1960s;  nothing since    Current Outpatient Medications on File Prior to Visit  Medication Sig Dispense Refill    acetaminophen  (TYLENOL ) 500 MG tablet Take 2 tablets (1,000 mg total) by mouth every 8 (eight) hours. 30 tablet 0   albuterol  (VENTOLIN  HFA) 108 (90 Base) MCG/ACT inhaler Inhale 2 puffs into the lungs every 6 (six) hours as needed for wheezing or shortness of breath. 8 g 2   aspirin EC 81 MG tablet Take 81 mg by mouth in the morning and at bedtime. Swallow whole.     budesonide -formoterol  (SYMBICORT ) 160-4.5 MCG/ACT inhaler Inhale 2 puffs into the lungs in the morning and at bedtime. 1 each 12   carboxymethylcellulose (REFRESH PLUS) 0.5 % SOLN Place 1-2 drops into both eyes in the morning, at noon, and at bedtime.     clobetasol  ointment (TEMOVATE ) 0.05 % Apply 1 Application topically once a week. Lichen sclerosus     furosemide  (LASIX ) 20 MG tablet TAKE ONE TABLET BY MOUTH EVERY DAY AS NEEDED FOR EDEMA 30 tablet 3   rosuvastatin  (CRESTOR ) 20 MG tablet Take 1 tablet (20 mg total) by mouth daily. 90 tablet 3   Spacer/Aero-Holding Chambers (AEROCHAMBER MV) inhaler Use as instructed 1 each 0   tacrolimus  (PROTOPIC ) 0.1 % ointment Apply 1 Application topically 2 (two) times daily as needed (for psoriasis).     Vitamin D , Ergocalciferol , 50 MCG (2000 UT) CAPS Take 2,000 Units by mouth in the morning.     No current facility-administered medications on file prior to visit.     ROS see history of present illness  Objective  Physical Exam Vitals:   05/28/24 0845  BP: 134/72  Pulse: 65  Temp: 98 F (36.7 C)  SpO2: 97%    BP Readings from Last 3 Encounters:  06/05/24 122/78  05/28/24 134/72  05/20/24 (!) 156/73   Wt Readings from Last 3 Encounters:  06/05/24 179 lb 6.4 oz (81.4 kg)  05/28/24 178 lb 9.6 oz (81 kg)  05/20/24 181 lb (82.1 kg)    Physical Exam Constitutional:      General: She is not in acute distress.    Appearance: Normal appearance.  HENT:     Head: Normocephalic.  Cardiovascular:     Rate and Rhythm: Normal rate and regular rhythm.     Heart sounds: Normal  heart sounds.  Pulmonary:     Effort: Pulmonary effort is normal.     Breath sounds: Normal breath sounds.  Skin:    General: Skin is warm and dry.  Neurological:     General: No focal deficit present.     Mental Status: She is alert.  Psychiatric:        Mood and Affect: Mood normal.        Behavior: Behavior normal.      Assessment/Plan: Please see individual problem list.  S/P TKR (total knee replacement), left Assessment & Plan: Post-operative recovery is progressing well with physical therapy. She walks without assistance but uses a cane for long distances. Pain is minimal and managed with Tylenol . Continue physical therapy three times a week. Use a cane as needed for long distances. Use Tylenol  for pain management as needed. Follow up as scheduled. Check CBC.   Orders: -     CBC with Differential/Platelet  Primary hypertension Assessment & Plan: Chronic. Blood pressure is well controlled on Losartan  25 mg daily. Continue Losartan . Encourage regular home blood pressure monitoring.  Orders: -     Basic metabolic panel with GFR -     Losartan  Potassium; Take 1 tablet (25 mg total) by mouth daily.  Dispense: 90 tablet; Refill: 3  Guillain Barr syndrome [facility]) Assessment & Plan: Chronic. Stable. No new issues per recent neurology evaluation. Schedule follow-up with neurology in one year unless new symptoms arise.   ILD (interstitial lung disease) (HCC) Assessment & Plan: Chronic shortness of breath is stable and managed with inhalers, though more noticeable with increased activity. Follow up with pulmonology to discuss inhaler use and management.   Mixed hyperlipidemia Assessment & Plan: Chronic. Currently on Crestor  20 mg daily with no abdominal pains or muscle aches. Continue Crestor .   Prediabetes Assessment & Plan: Check A1c.   Orders: -     Hemoglobin A1c     Return in about 6 months (around 11/28/2024) for Follow up.   Leron Glance, NP-C [place]  Primary Care - [facility]

## 2024-05-29 DIAGNOSIS — M6281 Muscle weakness (generalized): Secondary | ICD-10-CM | POA: Diagnosis not present

## 2024-05-29 DIAGNOSIS — R2689 Other abnormalities of gait and mobility: Secondary | ICD-10-CM | POA: Diagnosis not present

## 2024-05-29 DIAGNOSIS — R279 Unspecified lack of coordination: Secondary | ICD-10-CM | POA: Diagnosis not present

## 2024-05-29 DIAGNOSIS — M158 Other polyosteoarthritis: Secondary | ICD-10-CM | POA: Diagnosis not present

## 2024-05-30 DIAGNOSIS — R279 Unspecified lack of coordination: Secondary | ICD-10-CM | POA: Diagnosis not present

## 2024-05-30 DIAGNOSIS — M6281 Muscle weakness (generalized): Secondary | ICD-10-CM | POA: Diagnosis not present

## 2024-05-30 DIAGNOSIS — M158 Other polyosteoarthritis: Secondary | ICD-10-CM | POA: Diagnosis not present

## 2024-05-30 DIAGNOSIS — R2689 Other abnormalities of gait and mobility: Secondary | ICD-10-CM | POA: Diagnosis not present

## 2024-06-02 DIAGNOSIS — M6281 Muscle weakness (generalized): Secondary | ICD-10-CM | POA: Diagnosis not present

## 2024-06-02 DIAGNOSIS — R279 Unspecified lack of coordination: Secondary | ICD-10-CM | POA: Diagnosis not present

## 2024-06-02 DIAGNOSIS — R2689 Other abnormalities of gait and mobility: Secondary | ICD-10-CM | POA: Diagnosis not present

## 2024-06-02 DIAGNOSIS — M158 Other polyosteoarthritis: Secondary | ICD-10-CM | POA: Diagnosis not present

## 2024-06-03 DIAGNOSIS — N83202 Unspecified ovarian cyst, left side: Secondary | ICD-10-CM | POA: Diagnosis not present

## 2024-06-03 DIAGNOSIS — N83292 Other ovarian cyst, left side: Secondary | ICD-10-CM | POA: Diagnosis not present

## 2024-06-03 DIAGNOSIS — R9389 Abnormal findings on diagnostic imaging of other specified body structures: Secondary | ICD-10-CM | POA: Diagnosis not present

## 2024-06-03 DIAGNOSIS — N840 Polyp of corpus uteri: Secondary | ICD-10-CM | POA: Diagnosis not present

## 2024-06-03 DIAGNOSIS — Z96652 Presence of left artificial knee joint: Secondary | ICD-10-CM | POA: Diagnosis not present

## 2024-06-04 DIAGNOSIS — M6281 Muscle weakness (generalized): Secondary | ICD-10-CM | POA: Diagnosis not present

## 2024-06-04 DIAGNOSIS — M158 Other polyosteoarthritis: Secondary | ICD-10-CM | POA: Diagnosis not present

## 2024-06-04 DIAGNOSIS — R279 Unspecified lack of coordination: Secondary | ICD-10-CM | POA: Diagnosis not present

## 2024-06-04 DIAGNOSIS — R2689 Other abnormalities of gait and mobility: Secondary | ICD-10-CM | POA: Diagnosis not present

## 2024-06-04 NOTE — Progress Notes (Unsigned)
 Cardiology Office Note    Date:  06/05/2024   ID:  Alison Huffman, DOB April 04, 1941, MRN [id number]  PCP:  Alison App, NP  Cardiologist:  Alison Lunger, MD  Electrophysiologist:  Alison ONEIDA HOLTS, MD   Chief Complaint: Follow-up  History of Present Illness:   Alison Huffman is a 83 y.o. 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  She underwent SJM dual-chamber PPM implantation in 07/2018 for secondary AV block type II.  Echo in 04/2020 showed an EF of 55 to 60%, no regional wall motion abnormalities, grade 1 diastolic dysfunction, normal RV systolic function and ventricular cavity size, moderately dilated left atrium, trivial mitral regurgitation, aortic valve sclerosis without evidence of stenosis.  Coronary CTA in 06/2021 showed a calcium  score of 10.9 which was a 17th percentile with less than 25% mid LAD stenosis.  Most recent echo from 10/2023 showed an EF of 55%, septal wall motion abnormality consistent with bundle branch block, grade 1 diastolic dysfunction, normal RV systolic function, ventricular cavity size, and RVSP, moderately dilated left atrium, mild to moderate mitral, tricuspid, and aortic regurgitation, and aortic valve sclerosis without evidence of stenosis.  He comes in doing well from a cardiac perspective and is without symptoms of angina or chronic decompensation.  Chronic dyspnea is stable with prior CT imaging showing postinflammatory residua from COVID-19 pneumonia.  She indicates that her smart watch is indicated elevated heart rate readings at times over the past month.  No dizziness, presyncope, or syncope.  No significant lower extremity swelling or progressive orthopnea.  Recently underwent left total knee replacement without  cardiac complication.   Labs independently reviewed: 05/2024 - A1c 6.1, potassium 4.4, BUN 13, serum creatinine 0.55, Hgb 12.9, PLT 216 03/2024 - albumin  4.1, AST/ALT normal 10/2023 - TC 159, TG 62, HDL 60, LDL 87 05/2023 - TSH normal  Past Medical History:  Diagnosis Date   (HFpEF) heart failure with preserved ejection fraction (HCC)    Aortic atherosclerosis (HCC)    Atrial tachycardia (HCC)    Atypical chest pain    a. 10/2018 MV: EF 57%. No ischemia/infarct.   Cholelithiasis    Coronary artery disease    Dyspnea    GERD (gastroesophageal reflux disease)    Guillain-Barre (HCC)    Headache    Hyperlipidemia    Hypertension    Inflammatory neuropathy (HCC)    Joint pain in fingers of right hand    Lichen sclerosus    Osteopenia    Pneumonia 2012? 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   Psoriasis    Psoriatic arthritis (HCC)    RBBB (right bundle branch block)    Rosacea    Seborrheic keratosis    Second degree heart block    a.) s/p SJM PPM placement 07/23/2018   Sleep apnea    a.) does not utilize prescribed nocturnal PAP therapy   TMJ (dislocation of temporomandibular joint)    Unilateral primary osteoarthritis, left knee     Past Surgical History:  Procedure Laterality Date   APPENDECTOMY     BREAST CYST ASPIRATION Bilateral    BREAST CYST EXCISION Right 1978   benign   COLONOSCOPY  COLONOSCOPY WITH PROPOFOL  N/A 07/10/2019   Procedure: COLONOSCOPY WITH PROPOFOL ;  Surgeon: Alison Gladis PENNER, MD;  Location: [facility] ENDOSCOPY;  Service: Endoscopy;  Laterality: N/A;   ESOPHAGOGASTRODUODENOSCOPY (EGD) WITH PROPOFOL  N/A 07/10/2019   Procedure: ESOPHAGOGASTRODUODENOSCOPY (EGD) WITH PROPOFOL ;  Surgeon: Alison Gladis PENNER, MD;  Location: [facility] ENDOSCOPY;  Service: Endoscopy;  Laterality: N/A;   HYSTEROSCOPY WITH D & C N/A 11/05/2018   Procedure: DILATATION AND CURETTAGE  /HYSTEROSCOPY;  Surgeon: Alison Claudell SAUNDERS, MD;  Location: [facility] ORS;  Service: Gynecology;  Laterality: N/A;   INSERT / REPLACE / REMOVE PACEMAKER     PACEMAKER IMPLANT N/A 07/23/2018   SJM Assurity [id number] implanted by Dr Kelsie for mobitz II second degree AV block   TOOTH EXTRACTION  12/05/2023   TOTAL KNEE ARTHROPLASTY Left 03/17/2024   Procedure: ARTHROPLASTY, KNEE, TOTAL;  Surgeon: Alison Hussar, MD;  Location: [facility] ORS;  Service: Orthopedics;  Laterality: Left;   UTERINE POLYPS REMOVAL      Current Medications: No outpatient medications have been marked as taking for the 06/05/24 encounter (Office Visit) with Alison Bernardino HERO, PA-C.    Allergies:   Enbrel [etanercept], Codeine, and Nickel   Social History   Socioeconomic History   Marital status: Single    Spouse name: Not on file   Number of children: Not on file   Years of education: Not on file   Highest education level: Bachelor's degree (e.g., BA, AB, BS)  Occupational History   Not on file  Tobacco Use   Smoking status: Never   Smokeless tobacco: Never   Tobacco comments:    07/22/2018 smoked some in college; 1960s;  nothing since  Vaping Use   Vaping status: Never Used  Substance and Sexual Activity   Alcohol  use: Yes    Comment: rarely   Drug use: Never   Sexual activity: Not Currently    Birth control/protection: Post-menopausal  Other Topics Concern   Not on file  Social History Narrative   Right Handed   Lives in an apartment. 5th floor but has elevator    Drinks Caffeine.    Social Drivers of Corporate investment banker Strain: Low Risk  (06/03/2024)   Received from [facility]   Overall Financial Resource Strain (CARDIA)    Difficulty of Paying Living Expenses: Not hard at all  Food Insecurity: No Food Insecurity (06/03/2024)   Received from [facility]   Hunger Vital Sign    Within the past 12 months, you worried that your food would run out before you got the  money to buy more.: Never true    Within the past 12 months, the food you bought just didn't last and you didn't have money to get more.: Never true  Transportation Needs: No Transportation Needs (06/03/2024)   Received from [facility] - Transportation    In the past 12 months, has lack of transportation kept you from medical appointments or from getting medications?: No    Lack of Transportation (Non-Medical): No  Physical Activity: Sufficiently Active (05/21/2024)   Exercise Vital Sign    Days of Exercise per Week: 5 days    Minutes of Exercise per Session: 40 min  Stress: No Stress Concern Present (05/21/2024)   [employer] of Occupational Health - Occupational Stress Questionnaire    Feeling of Stress: Only a little  Social Connections: Moderately Integrated (05/21/2024)   Social Connection and Isolation Panel    Frequency of Communication  with Friends and Family: More than three times a week    Frequency of Social Gatherings with Friends and Family: Not on file    Attends Religious Services: More than 4 times per year    Active Member of [employer] or Organizations: Yes    Attends Engineer, structural: More than 4 times per year    Marital Status: Never married     Family History:  The patient's family history includes Atrial fibrillation in her mother; Breast cancer in her maternal aunt, mother, and paternal aunt; Stroke in her father and mother.  ROS:   12-point review of systems is negative unless otherwise noted in the HPI.   EKGs/Labs/Other Studies Reviewed:    Studies reviewed were summarized above. The additional studies were reviewed today:  2D echo 10/24/2023: 1. Left ventricular ejection fraction, by estimation, is 55 %. The left  ventricle has normal function. The left ventricle demonstrates dyskinesis  and regional wall motion abnormalities (septal wall motion consistent with  bundle branch block). Left  ventricular diastolic  parameters are consistent with Grade I diastolic  dysfunction (impaired relaxation).   2. Right ventricular systolic function is normal. The right ventricular  size is normal. There is normal pulmonary artery systolic pressure. The  estimated right ventricular systolic pressure is 21.0 mmHg.   3. Left atrial size was moderately dilated.   4. The mitral valve is normal in structure. Mild to moderate mitral valve  regurgitation. No evidence of mitral stenosis.   5. Tricuspid valve regurgitation is mild to moderate.   6. The aortic valve is normal in structure. There is mild calcification  of the aortic valve. Aortic valve regurgitation is mild to moderate.  Aortic valve sclerosis is present, with no evidence of aortic valve  stenosis.   7. The inferior vena cava is normal in size with greater than 50%  respiratory variability, suggesting right atrial pressure of 3 mmHg.   Comparison(s):Compared to prior study in 7/21, bundle branch block is now  noted with associated dyskinesis.  __________  Coronary CTA 06/02/2021: FINDINGS: Aorta: Normal size. Minimal aortic root calcifications. No dissection.   Aortic Valve:  Trileaflet. mild calcifications.   Coronary Arteries:  Normal coronary origin.  Right dominance.   RCA is a dominant artery that gives rise to PDA and PLA. There is no plaque.   Left main is a large artery that gives rise to LAD and LCX arteries. No LM disease is noted.   LAD is a large vessel that has minimal calcifications in the mid vessel causing minimal (<25%) stenosis.   LCX is a non-dominant artery that gives rise to one large OM1 branch. There is no plaque.   Other findings:   Normal pulmonary vein drainage into the left atrium.   Normal left atrial appendage without a thrombus.   Normal size of the pulmonary artery.   IMPRESSION: 1. Coronary calcium  score of 10.9. This was 17th percentile for age and sex matched control.   2. Normal coronary origin  with right dominance.   3. Minimal non obstructive mid LAD disease (<25%).   4. CAD-RADS 1. Minimal non-obstructive CAD (0-24%). Consider non-atherosclerotic causes of chest pain. Consider preventive therapy and risk factor modification. __________  2D echo 04/12/2020: 1. Left ventricular ejection fraction, by estimation, is 55 to 60%. The  left ventricle has normal function. The left ventricle has no regional  wall motion abnormalities. Left ventricular diastolic parameters are  consistent with Grade I diastolic  dysfunction (impaired  relaxation).   2. Right ventricular systolic function is normal. The right ventricular  size is normal.   3. Left atrial size was moderately dilated.   4. The mitral valve is normal in structure. Trivial mitral valve  regurgitation. No evidence of mitral stenosis.   5. The aortic valve is tricuspid. Aortic valve regurgitation is not  visualized. Mild aortic valve sclerosis is present, with no evidence of  aortic valve stenosis.   6. The inferior vena cava is normal in size with greater than 50%  respiratory variability, suggesting right atrial pressure of 3 mmHg.  __________  Lexiscan  MPI 10/24/2018: Pharmacological myocardial perfusion imaging study with no significant  ischemia Normal wall motion, EF estimated at 57% No EKG changes concerning for ischemia at peak stress or in recovery. Rare PVCs noted Low risk scan ___________  2D echo 04/25/2018: - Left ventricle: The cavity size was normal. Systolic function was    normal. The estimated ejection fraction was in the range of 55%    to 60%. Wall motion was normal; there were no regional wall    motion abnormalities. Doppler parameters are consistent with    abnormal left ventricular relaxation (grade 1 diastolic    dysfunction).  - Aortic valve: There was trivial regurgitation.  - Mitral valve: There was mild regurgitation.  - Left atrium: The atrium was moderately dilated.  - Right  ventricle: Systolic function was normal.  - Pulmonary arteries: Systolic pressure was within the normal    range.  - Inferior vena cava: The vessel was normal in size. The    respirophasic diameter changes were in the normal range (>= 50%),    consistent with normal central venous pressure.   Impressions:   - Frequent APCs noted.    EKG:  EKG is ordered today.  The EKG ordered today demonstrates atrial sensed, ventricular paced rhythm with prolonged AV conduction, 67 bpm, rare PVC  Recent Labs: 03/06/2024: ALT 22 05/28/2024: BUN 13; Creatinine, Ser 0.55; Hemoglobin 12.9; Platelets 216.0; Potassium 4.4; Sodium 140  Recent Lipid Panel    Component Value Date/Time   CHOL 159 10/19/2023 0918   CHOL 156 04/11/2018 0900   TRIG 62.0 10/19/2023 0918   HDL 60.10 10/19/2023 0918   HDL 58 04/11/2018 0900   CHOLHDL 3 10/19/2023 0918   VLDL 12.4 10/19/2023 0918   LDLCALC 87 10/19/2023 0918   LDLCALC 84 04/11/2018 0900   LDLDIRECT 70.0 04/18/2023 1134    PHYSICAL EXAM:    VS:  BP 122/78 (BP Location: Left Arm, Patient Position: Sitting, Cuff Size: Normal)   Pulse 67   Ht 5' 4 (1.626 m)   Wt 179 lb 6.4 oz (81.4 kg)   SpO2 97%   BMI 30.79 kg/m   BMI: Body mass index is 30.79 kg/m.  Physical Exam Vitals reviewed.  Constitutional:      Appearance: She is well-developed.  HENT:     Head: Normocephalic and atraumatic.  Eyes:     General:        Right eye: No discharge.        Left eye: No discharge.  Cardiovascular:     Rate and Rhythm: Normal rate and regular rhythm.     Heart sounds: Normal heart sounds, S1 normal and S2 normal. Heart sounds not distant. No midsystolic click and no opening snap. No murmur heard.    No friction rub.  Pulmonary:     Effort: Pulmonary effort is normal. No respiratory distress.     Breath  sounds: Normal breath sounds. No decreased breath sounds, wheezing or rales.  Musculoskeletal:     Cervical back: Normal range of motion.  Skin:    General:  Skin is warm and dry.     Nails: There is no clubbing.  Neurological:     Mental Status: She is alert and oriented to person, place, and time.  Psychiatric:        Speech: Speech normal.        Behavior: Behavior normal.        Thought Content: Thought content normal.        Judgment: Judgment normal.     Wt Readings from Last 3 Encounters:  06/05/24 179 lb 6.4 oz (81.4 kg)  05/28/24 178 lb 9.6 oz (81 kg)  05/20/24 181 lb (82.1 kg)     ASSESSMENT & PLAN:   Nonobstructive CAD: No symptoms suggestive of angina or cardiac decompensation.  Continue aggressive risk factor modification and primary prevention including rosuvastatin  20 mg.  Has not been maintained on aspirin.  No indication for ischemic testing at this time.  HFpEF: Appears euvolemic and well compensated.  Has not needed any as needed furosemide .  Consider addition of MRA/SGLT2 inhibitor, will defer to primary cardiologist.  Second-degree AV block type II status post PPM: Management per EP.  She reports her watch has indicated elevated heart rates at times.  We will reach out to the device clinic to further evaluate and interrogate device.  HTN: Blood pressure is well-controlled in the office today.  HLD: LDL 87 in 10/2023.  Target LDL less than 70.  Prefers to continue rosuvastatin  20 mg at this time.  Recommended fasting lipid panel at next visit and if LDL remains above goal would titrate rosuvastatin  to 40 mg.  OSA: CPAP.    Disposition: F/u with Dr. Gollan in 6 months, and EP as directed.    Medication Adjustments/Labs and Tests Ordered: Current medicines are reviewed at length with the patient today.  Concerns regarding medicines are outlined above. Medication changes, Labs and Tests ordered today are summarized above and listed in the Patient Instructions accessible in Encounters.   Signed, Bernardino Bring, PA-C 06/05/2024 12:33 PM     [facility] [place] [address] [place],  [place] [address] [phone]

## 2024-06-05 ENCOUNTER — Encounter: Payer: Self-pay | Admitting: Physician Assistant

## 2024-06-05 ENCOUNTER — Telehealth: Payer: Self-pay | Admitting: *Deleted

## 2024-06-05 ENCOUNTER — Ambulatory Visit: Attending: Physician Assistant | Admitting: Physician Assistant

## 2024-06-05 VITALS — BP 122/78 | HR 67 | Ht 64.0 in | Wt 179.4 lb

## 2024-06-05 DIAGNOSIS — I441 Atrioventricular block, second degree: Secondary | ICD-10-CM | POA: Insufficient documentation

## 2024-06-05 DIAGNOSIS — I2510 Atherosclerotic heart disease of native coronary artery without angina pectoris: Secondary | ICD-10-CM | POA: Diagnosis not present

## 2024-06-05 DIAGNOSIS — Z950 Presence of cardiac pacemaker: Secondary | ICD-10-CM | POA: Insufficient documentation

## 2024-06-05 DIAGNOSIS — E785 Hyperlipidemia, unspecified: Secondary | ICD-10-CM | POA: Insufficient documentation

## 2024-06-05 DIAGNOSIS — G4733 Obstructive sleep apnea (adult) (pediatric): Secondary | ICD-10-CM | POA: Insufficient documentation

## 2024-06-05 DIAGNOSIS — I5032 Chronic diastolic (congestive) heart failure: Secondary | ICD-10-CM | POA: Diagnosis not present

## 2024-06-05 DIAGNOSIS — I10 Essential (primary) hypertension: Secondary | ICD-10-CM | POA: Insufficient documentation

## 2024-06-05 NOTE — Patient Instructions (Signed)
 Medication Instructions:  Your physician recommends that you continue on your current medications as directed. Please refer to the Current Medication list given to you today.   *If you need a refill on your cardiac medications before your next appointment, please call your pharmacy*  Lab Work: None ordered at this time   Follow-Up: At [facility], you and your health needs are our priority.  As part of our continuing mission to provide you with exceptional heart care, our providers are all part of one team.  This team includes your primary Cardiologist (physician) and Advanced Practice Providers or APPs (Physician Assistants and Nurse Practitioners) who all work together to provide you with the care you need, when you need it.  Your next appointment:   6 month(s)  Provider:   You may see Timothy Gollan, MD

## 2024-06-05 NOTE — Telephone Encounter (Signed)
-----   Message from Nurse Marcus ORN sent at 06/05/2024 10:00 AM EDT ----- Regarding: AFib suspect [facility],  Bernardino would like a remote interrogation of pt's St Jude's PPM for suspicion of Afib.  Thanks, Marcus

## 2024-06-06 DIAGNOSIS — R2689 Other abnormalities of gait and mobility: Secondary | ICD-10-CM | POA: Diagnosis not present

## 2024-06-06 DIAGNOSIS — M158 Other polyosteoarthritis: Secondary | ICD-10-CM | POA: Diagnosis not present

## 2024-06-06 DIAGNOSIS — R279 Unspecified lack of coordination: Secondary | ICD-10-CM | POA: Diagnosis not present

## 2024-06-06 DIAGNOSIS — M6281 Muscle weakness (generalized): Secondary | ICD-10-CM | POA: Diagnosis not present

## 2024-06-06 NOTE — Telephone Encounter (Signed)
 Spoke with pt and she is sending in that transmission now

## 2024-06-06 NOTE — Telephone Encounter (Signed)
 Called the patient regarding the recommendations below. The patient stated she is a little hesitant about starting medication as she currently experiences fatigue. She also noted she has a vacation planned for September 15-23. However, the patient wanted to know what would happen if she did not start the medication.  Suzann was made aware and noted that it is okay if the patient would like to hold off on medication due to the low atrial tachycardia burden. However, if she develops symptoms, she should contact our office.  The patient was informed and stated she would contact our office if she develops any symptoms.   Could you call this patient please? She mentioned to [facility] yesterday that she was having a lot of palpitations. She is having what looks like Atrial Tach (not AFib) on her ILR.  We can add a little beta blocker to see if it helps her symptoms, but it can lower her BP. Her blood pressure yesterday in clinic seems robust enough to handle a low dose of metoprolol  (25mg  toprol  daily), but would want her to check it periodically at home to ensure it doesn't drop too low.    Or, alternatively we could continue to monitor this with her PPM and not add any new medications. I am fine with either option.      Thank you, Suzann

## 2024-06-06 NOTE — Telephone Encounter (Signed)
 Transmission received & reviewed. Normal device function. AMS episodes c/w AT. Longest episode 6 minutes 34 seconds on 05/24/2024. Episode attached below. AT/AF burden <1%.   Will forward to provider for review and further recommendations.

## 2024-06-09 DIAGNOSIS — R2689 Other abnormalities of gait and mobility: Secondary | ICD-10-CM | POA: Diagnosis not present

## 2024-06-09 DIAGNOSIS — R279 Unspecified lack of coordination: Secondary | ICD-10-CM | POA: Diagnosis not present

## 2024-06-09 DIAGNOSIS — M158 Other polyosteoarthritis: Secondary | ICD-10-CM | POA: Diagnosis not present

## 2024-06-09 DIAGNOSIS — M6281 Muscle weakness (generalized): Secondary | ICD-10-CM | POA: Diagnosis not present

## 2024-06-10 ENCOUNTER — Encounter: Payer: Self-pay | Admitting: Nurse Practitioner

## 2024-06-10 NOTE — Assessment & Plan Note (Signed)
 Chronic. Stable. No new issues per recent neurology evaluation. Schedule follow-up with neurology in one year unless new symptoms arise.

## 2024-06-10 NOTE — Assessment & Plan Note (Signed)
 Chronic. Blood pressure is well controlled on Losartan  25 mg daily. Continue Losartan . Encourage regular home blood pressure monitoring.

## 2024-06-10 NOTE — Assessment & Plan Note (Signed)
 Chronic shortness of breath is stable and managed with inhalers, though more noticeable with increased activity. Follow up with pulmonology to discuss inhaler use and management.

## 2024-06-10 NOTE — Assessment & Plan Note (Addendum)
 Post-operative recovery is progressing well with physical therapy. She walks without assistance but uses a cane for long distances. Pain is minimal and managed with Tylenol . Continue physical therapy three times a week. Use a cane as needed for long distances. Use Tylenol  for pain management as needed. Follow up as scheduled. Check CBC.

## 2024-06-10 NOTE — Assessment & Plan Note (Signed)
 Check A1c.

## 2024-06-10 NOTE — Assessment & Plan Note (Signed)
 Chronic. Currently on Crestor  20 mg daily with no abdominal pains or muscle aches. Continue Crestor .

## 2024-06-11 ENCOUNTER — Telehealth: Payer: Self-pay

## 2024-06-11 DIAGNOSIS — R279 Unspecified lack of coordination: Secondary | ICD-10-CM | POA: Diagnosis not present

## 2024-06-11 DIAGNOSIS — M158 Other polyosteoarthritis: Secondary | ICD-10-CM | POA: Diagnosis not present

## 2024-06-11 DIAGNOSIS — M6281 Muscle weakness (generalized): Secondary | ICD-10-CM | POA: Diagnosis not present

## 2024-06-11 DIAGNOSIS — R2689 Other abnormalities of gait and mobility: Secondary | ICD-10-CM | POA: Diagnosis not present

## 2024-06-11 NOTE — Telephone Encounter (Signed)
 [facility] forms placed in provider to be signed folder

## 2024-06-12 NOTE — Telephone Encounter (Signed)
 Form faxed to [phone] with a completed transmission log

## 2024-06-13 DIAGNOSIS — R279 Unspecified lack of coordination: Secondary | ICD-10-CM | POA: Diagnosis not present

## 2024-06-13 DIAGNOSIS — M6281 Muscle weakness (generalized): Secondary | ICD-10-CM | POA: Diagnosis not present

## 2024-06-13 DIAGNOSIS — M158 Other polyosteoarthritis: Secondary | ICD-10-CM | POA: Diagnosis not present

## 2024-06-13 DIAGNOSIS — R2689 Other abnormalities of gait and mobility: Secondary | ICD-10-CM | POA: Diagnosis not present

## 2024-06-16 ENCOUNTER — Telehealth: Payer: Self-pay

## 2024-06-16 NOTE — Telephone Encounter (Signed)
 [facility] forms placed in provider to be signed folder

## 2024-06-17 NOTE — Telephone Encounter (Signed)
 Form faxed to [phone] with a completed transmission log

## 2024-06-18 ENCOUNTER — Telehealth: Payer: Self-pay

## 2024-06-18 NOTE — Telephone Encounter (Signed)
 [facility] forms placed in provider to be signed folder

## 2024-06-19 ENCOUNTER — Telehealth: Payer: Self-pay

## 2024-06-19 NOTE — Telephone Encounter (Signed)
 PT forms placed in provider to be signed folder

## 2024-06-20 NOTE — Telephone Encounter (Signed)
 Form faxed

## 2024-06-20 NOTE — Telephone Encounter (Signed)
 faxed

## 2024-06-27 ENCOUNTER — Encounter: Payer: Self-pay | Admitting: Nurse Practitioner

## 2024-06-27 ENCOUNTER — Ambulatory Visit (INDEPENDENT_AMBULATORY_CARE_PROVIDER_SITE_OTHER): Admitting: Nurse Practitioner

## 2024-06-27 VITALS — BP 126/84 | HR 75 | Temp 98.0°F | Ht 64.0 in | Wt 177.8 lb

## 2024-06-27 DIAGNOSIS — U071 COVID-19: Secondary | ICD-10-CM

## 2024-06-27 MED ORDER — PREDNISONE 20 MG PO TABS: 40.0000 mg | ORAL_TABLET | Freq: Every day | ORAL | 0 refills | Status: AC

## 2024-06-27 NOTE — Progress Notes (Signed)
 Established Patient Office Visit  Subjective:  Patient ID: Alison Huffman, female    DOB: 12-30-1940  Age: 83 y.o. MRN: [id number]  CC:  Chief Complaint  Patient presents with   Acute Visit    Runny nose, cough & fever of 100.5  Symptoms began 4-5 days ago Tested positive for covid on 06/26/24   Discussed the use of a AI scribe software for clinical note transcription with the patient, who gave verbal consent to proceed.  HPI  Alison Huffman is an 83 year old female who presents with a recent positive COVID-19 test and associated symptoms.  She tested positive for COVID-19 yesterday after developing symptoms earlier in the week. She traveled from [place]  and arrived early Wednesday morning. By Wednesday evening, she had a fever of 100.67F. Her symptoms include a runny nose, cough, and mild body aches. She denies sore throat, headaches, shortness of breath, or chest pain. During her previous COVID-19 infection in 2023, she received Paxlovid  and had more symptoms than she is experiencing now.  She has a history of lung issues, including collapsed lungs following a gallbladder incident that required ventilation. She also has a pacemaker and experiences some shortness of breath, which she attributes to her heart or lungs.  She has been taking Tylenol  regularly since her knee replacement in June to manage pain. She took Allegra earlier in the week but has not taken any other medications for her current symptoms.  She lives in a senior community and has been isolating since her positive test. Meals are being delivered to her apartment to avoid contact with others. She uses a cane for balance, a need that existed prior to her knee replacement.  HPI   Past Medical History:  Diagnosis Date   (HFpEF) heart failure with preserved ejection fraction (HCC)    Aortic atherosclerosis    Atrial tachycardia    Atypical chest pain    a. 10/2018 MV: EF 57%. No ischemia/infarct.    Cholelithiasis    Coronary artery disease    Dyspnea    GERD (gastroesophageal reflux disease)    Guillain-Barre    Headache    Hyperlipidemia    Hypertension    Inflammatory neuropathy    Joint pain in fingers of right hand    Lichen sclerosus    Osteopenia    Pneumonia 2012? 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   Psoriasis    Psoriatic arthritis (HCC)    RBBB (right bundle branch block)    Rosacea    Seborrheic keratosis    Second degree heart block    a.) s/p SJM PPM placement 07/23/2018   Sleep apnea    a.) does not utilize prescribed nocturnal PAP therapy   TMJ (dislocation of temporomandibular joint)    Unilateral primary osteoarthritis, left knee     Past Surgical History:  Procedure Laterality Date   APPENDECTOMY     BREAST CYST ASPIRATION Bilateral    BREAST CYST EXCISION Right 1978   benign   COLONOSCOPY     COLONOSCOPY WITH PROPOFOL  N/A 07/10/2019   Procedure: COLONOSCOPY WITH PROPOFOL ;  Surgeon: Gaylyn Gladis PENNER, MD;  Location: [facility] ENDOSCOPY;  Service: Endoscopy;  Laterality: N/A;   ESOPHAGOGASTRODUODENOSCOPY (EGD) WITH PROPOFOL  N/A 07/10/2019   Procedure: ESOPHAGOGASTRODUODENOSCOPY (EGD) WITH PROPOFOL ;  Surgeon: Gaylyn Gladis PENNER, MD;  Location: [name] END[facility]COPY;  Service: Endoscopy;  Laterality: N/A;  HYSTEROSCOPY WITH D & C N/A 11/05/2018   Procedure: DILATATION AND CURETTAGE /HYSTEROSCOPY;  Surgeon: Victor Claudell SAUNDERS, MD;  Location: [facility] ORS;  Service: Gynecology;  Laterality: N/A;   INSERT / REPLACE / REMOVE PACEMAKER     PACEMAKER IMPLANT N/A 07/23/2018   SJM Assurity 2272 implanted by Dr Kelsie for mobitz II second degree AV block   TOOTH EXTRACTION  12/05/2023   TOTAL KNEE ARTHROPLASTY Left 03/17/2024   Procedure: ARTHROPLASTY, KNEE, TOTAL;  Surgeon: Lorelle Hussar, MD;  Location: [facility] ORS;  Service: Orthopedics;  Laterality: Left;   UTERINE POLYPS  REMOVAL      Family History  Problem Relation Age of Onset   Stroke Mother    Atrial fibrillation Mother    Breast cancer Mother    Stroke Father    Breast cancer Maternal Aunt    Breast cancer Paternal Aunt     Social History   Socioeconomic History   Marital status: Single    Spouse name: Not on file   Number of children: Not on file   Years of education: Not on file   Highest education level: Bachelor's degree (e.g., BA, AB, BS)  Occupational History   Not on file  Tobacco Use   Smoking status: Never   Smokeless tobacco: Never   Tobacco comments:    07/22/2018 smoked some in college; 1960s;  nothing since  Vaping Use   Vaping status: Never Used  Substance and Sexual Activity   Alcohol  use: Yes    Comment: rarely   Drug use: Never   Sexual activity: Not Currently    Birth control/protection: Post-menopausal  Other Topics Concern   Not on file  Social History Narrative   Right Handed   Lives in an apartment. 5th floor but has elevator    Drinks Caffeine.    Social Drivers of Corporate investment banker Strain: Low Risk  (06/03/2024)   Received from [facility]   Overall Financial Resource Strain (CARDIA)    Difficulty of Paying Living Expenses: Not hard at all  Food Insecurity: No Food Insecurity (06/03/2024)   Received [facility]   Hunger Vital Sign    Within the past 12 months, you worried that your food would run out before you got the money to buy more.: Never true    Within the past 12 months, the food you bought just didn't last and you didn't have money to get more.: Never true  Transportation Needs: No Transportation Needs (06/03/2024)   Received from [facility] - Transportation    In the past 12 months, has lack of transportation kept you from medical appointments or from getting medications?: No    Lack of Transportation (Non-Medical): No  Physical Activity: Sufficiently Active  (05/21/2024)   Exercise Vital Sign    Days of Exercise per Week: 5 days    Minutes of Exercise per Session: 40 min  Stress: No Stress Concern Present (05/21/2024)   [employer] of Occupational Health - Occupational Stress Questionnaire    Feeling of Stress: Only a little  Social Connections: Moderately Integrated (05/21/2024)   Social Connection and Isolation Panel    Frequency of Communication with Friends and Family: More than three times a week    Frequency of Social Gatherings with Friends and Family: Not on file    Attends Religious Services: More than 4 times per year    Active Member of Clubs or Organizations: Yes  Attends Banker Meetings: More than 4 times per year    Marital Status: Never married  Intimate Partner Violence: Not At Risk (03/17/2024)   Humiliation, Afraid, Rape, and Kick questionnaire    Fear of Current or Ex-Partner: No    Emotionally Abused: No    Physically Abused: No    Sexually Abused: No     Outpatient Medications Prior to Visit  Medication Sig Dispense Refill   acetaminophen  (TYLENOL ) 500 MG tablet Take 2 tablets (1,000 mg total) by mouth every 8 (eight) hours. 30 tablet 0   albuterol  (VENTOLIN  HFA) 108 (90 Base) MCG/ACT inhaler Inhale 2 puffs into the lungs every 6 (six) hours as needed for wheezing or shortness of breath. 8 g 2   budesonide -formoterol  (SYMBICORT ) 160-4.5 MCG/ACT inhaler Inhale 2 puffs into the lungs in the morning and at bedtime. 1 each 12   carboxymethylcellulose (REFRESH PLUS) 0.5 % SOLN Place 1-2 drops into both eyes in the morning, at noon, and at bedtime.     clobetasol  ointment (TEMOVATE ) 0.05 % Apply 1 Application topically once a week. Lichen sclerosus     furosemide  (LASIX ) 20 MG tablet TAKE ONE TABLET BY MOUTH EVERY DAY AS NEEDED FOR EDEMA 30 tablet 3   losartan  (COZAAR ) 25 MG tablet Take 1 tablet (25 mg total) by mouth daily. 90 tablet 3   rosuvastatin  (CRESTOR ) 20 MG tablet Take 1 tablet (20 mg total) by  mouth daily. 90 tablet 3   Spacer/Aero-Holding Chambers (AEROCHAMBER MV) inhaler Use as instructed 1 each 0   tacrolimus  (PROTOPIC ) 0.1 % ointment Apply 1 Application topically 2 (two) times daily as needed (for psoriasis).     Vitamin D , Ergocalciferol , 50 MCG (2000 UT) CAPS Take 2,000 Units by mouth in the morning.     aspirin EC 81 MG tablet Take 81 mg by mouth in the morning and at bedtime. Swallow whole.     No facility-administered medications prior to visit.    Allergies  Allergen Reactions   Enbrel [Etanercept] Other (See Comments)    Guillain Baree   Codeine Other (See Comments)    Stomach ache   Nickel     ROS Review of Systems Negative unless indicated in HPI.    Objective:    Physical Exam Constitutional:      Appearance: Normal appearance.  Eyes:     Conjunctiva/sclera: Conjunctivae normal.     Pupils: Pupils are equal, round, and reactive to light.  Cardiovascular:     Rate and Rhythm: Normal rate and regular rhythm.     Pulses: Normal pulses.     Heart sounds: Normal heart sounds.  Pulmonary:     Effort: Pulmonary effort is normal.     Breath sounds: Normal breath sounds.  Musculoskeletal:     Cervical back: Normal range of motion. No tenderness.  Skin:    General: Skin is warm.     Findings: No bruising.  Neurological:     General: No focal deficit present.     Mental Status: She is alert and oriented to person, place, and time. Mental status is at baseline.  Psychiatric:        Mood and Affect: Mood normal.        Behavior: Behavior normal.        Thought Content: Thought content normal.        Judgment: Judgment normal.     BP 126/84   Pulse 75   Temp 98 F (36.7 C)   Ht  5' 4 (1.626 m)   Wt 177 lb 12.8 oz (80.6 kg)   SpO2 97%   BMI 30.52 kg/m  Wt Readings from Last 3 Encounters:  06/27/24 177 lb 12.8 oz (80.6 kg)  06/05/24 179 lb 6.4 oz (81.4 kg)  05/28/24 178 lb 9.6 oz (81 kg)     Health Maintenance  Topic Date Due   Diabetic  kidney evaluation - Urine ACR  Never done   Mammogram  07/01/2024   COVID-19 Vaccine (8 - 2025-26 season) 07/12/2024 (Originally 06/02/2024)   Influenza Vaccine  12/30/2024 (Originally 05/02/2024)   Medicare Annual Wellness (AWV)  12/18/2024   Diabetic kidney evaluation - eGFR measurement  05/28/2025   DTaP/Tdap/Td (2 - Td or Tdap) 09/05/2027   Pneumococcal Vaccine: 50+ Years  Completed   DEXA SCAN  Completed   Zoster Vaccines- Shingrix  Completed   HPV VACCINES  Aged Out   Meningococcal B Vaccine  Aged Out   Hepatitis B Vaccines 19-59 Average Risk  Discontinued    There are no preventive care reminders to display for this patient.  Lab Results  Component Value Date   TSH 1.39 05/21/2023   Lab Results  Component Value Date   WBC 4.7 05/28/2024   HGB 12.9 05/28/2024   HCT 39.5 05/28/2024   MCV 94.1 05/28/2024   PLT 216.0 05/28/2024   Lab Results  Component Value Date   NA 140 05/28/2024   K 4.4 05/28/2024   CO2 27 05/28/2024   GLUCOSE 90 05/28/2024   BUN 13 05/28/2024   CREATININE 0.55 05/28/2024   BILITOT 0.7 03/06/2024   ALKPHOS 74 03/06/2024   AST 23 03/06/2024   ALT 22 03/06/2024   PROT 7.0 03/06/2024   ALBUMIN  4.1 03/06/2024   CALCIUM  9.3 05/28/2024   ANIONGAP 5 03/18/2024   EGFR 89 05/23/2021   GFR 84.09 05/28/2024   Lab Results  Component Value Date   CHOL 159 10/19/2023   Lab Results  Component Value Date   HDL 60.10 10/19/2023   Lab Results  Component Value Date   LDLCALC 87 10/19/2023   Lab Results  Component Value Date   TRIG 62.0 10/19/2023   Lab Results  Component Value Date   CHOLHDL 3 10/19/2023   Lab Results  Component Value Date   HGBA1C 6.1 05/28/2024      Assessment & Plan:  Positive self-administered antigen test for COVID-19 Assessment & Plan: COVID-19 infection with mild symptoms, no current fever or dyspnea. - Prescribed prednisone  and recommended Mucinex . - Advised monitoring symptoms and seeking urgent care if  worsened. - Instructed isolation for five days, mask use for additional five days if symptoms persist. -Red flag discussed.  Patient was provided clear instructions to go to ER or urgent care if symptoms does not improve, red flag or new problem develops.  Patient verbalized understanding.    Other orders -     predniSONE ; Take 2 tablets (40 mg total) by mouth daily with breakfast for 5 days.  Dispense: 10 tablet; Refill: 0    Follow-up: No follow-ups on file.   Shaka Zech, NP

## 2024-07-01 DIAGNOSIS — R279 Unspecified lack of coordination: Secondary | ICD-10-CM | POA: Diagnosis not present

## 2024-07-01 DIAGNOSIS — M158 Other polyosteoarthritis: Secondary | ICD-10-CM | POA: Diagnosis not present

## 2024-07-01 DIAGNOSIS — U071 COVID-19: Secondary | ICD-10-CM | POA: Insufficient documentation

## 2024-07-01 DIAGNOSIS — M6281 Muscle weakness (generalized): Secondary | ICD-10-CM | POA: Diagnosis not present

## 2024-07-01 DIAGNOSIS — R2689 Other abnormalities of gait and mobility: Secondary | ICD-10-CM | POA: Diagnosis not present

## 2024-07-01 NOTE — Assessment & Plan Note (Signed)
 COVID-19 infection with mild symptoms, no current fever or dyspnea. - Prescribed prednisone  and recommended Mucinex . - Advised monitoring symptoms and seeking urgent care if worsened. - Instructed isolation for five days, mask use for additional five days if symptoms persist. -Red flag discussed.  Patient was provided clear instructions to go to ER or urgent care if symptoms does not improve, red flag or new problem develops.  Patient verbalized understanding.

## 2024-07-03 DIAGNOSIS — M158 Other polyosteoarthritis: Secondary | ICD-10-CM | POA: Diagnosis not present

## 2024-07-03 DIAGNOSIS — R2689 Other abnormalities of gait and mobility: Secondary | ICD-10-CM | POA: Diagnosis not present

## 2024-07-07 DIAGNOSIS — R490 Dysphonia: Secondary | ICD-10-CM | POA: Diagnosis not present

## 2024-07-07 DIAGNOSIS — K219 Gastro-esophageal reflux disease without esophagitis: Secondary | ICD-10-CM | POA: Diagnosis not present

## 2024-07-07 DIAGNOSIS — H6123 Impacted cerumen, bilateral: Secondary | ICD-10-CM | POA: Diagnosis not present

## 2024-07-07 DIAGNOSIS — J3801 Paralysis of vocal cords and larynx, unilateral: Secondary | ICD-10-CM | POA: Diagnosis not present

## 2024-07-09 ENCOUNTER — Ambulatory Visit (INDEPENDENT_AMBULATORY_CARE_PROVIDER_SITE_OTHER): Payer: Medicare Other

## 2024-07-09 DIAGNOSIS — I2510 Atherosclerotic heart disease of native coronary artery without angina pectoris: Secondary | ICD-10-CM | POA: Diagnosis not present

## 2024-07-09 DIAGNOSIS — M158 Other polyosteoarthritis: Secondary | ICD-10-CM | POA: Diagnosis not present

## 2024-07-09 DIAGNOSIS — R2689 Other abnormalities of gait and mobility: Secondary | ICD-10-CM | POA: Diagnosis not present

## 2024-07-10 ENCOUNTER — Telehealth: Payer: Self-pay

## 2024-07-10 DIAGNOSIS — M158 Other polyosteoarthritis: Secondary | ICD-10-CM | POA: Diagnosis not present

## 2024-07-10 DIAGNOSIS — R2689 Other abnormalities of gait and mobility: Secondary | ICD-10-CM | POA: Diagnosis not present

## 2024-07-10 LAB — CUP PACEART REMOTE DEVICE CHECK
Battery Remaining Longevity: 61 mo
Battery Remaining Percentage: 49 %
Battery Voltage: 2.98 V
Brady Statistic AP VP Percent: 6 %
Brady Statistic AP VS Percent: 1 %
Brady Statistic AS VP Percent: 91 %
Brady Statistic AS VS Percent: 2 %
Brady Statistic RA Percent Paced: 5.6 %
Brady Statistic RV Percent Paced: 97 %
Date Time Interrogation Session: 20251008020016
Implantable Lead Connection Status: 753985
Implantable Lead Connection Status: 753985
Implantable Lead Implant Date: 20191022
Implantable Lead Implant Date: 20191022
Implantable Lead Location: 753859
Implantable Lead Location: 753860
Implantable Pulse Generator Implant Date: 20191022
Lead Channel Impedance Value: 560 Ohm
Lead Channel Impedance Value: 660 Ohm
Lead Channel Pacing Threshold Amplitude: 0.5 V
Lead Channel Pacing Threshold Amplitude: 0.75 V
Lead Channel Pacing Threshold Pulse Width: 0.5 ms
Lead Channel Pacing Threshold Pulse Width: 0.5 ms
Lead Channel Sensing Intrinsic Amplitude: 12 mV
Lead Channel Sensing Intrinsic Amplitude: 4.4 mV
Lead Channel Setting Pacing Amplitude: 1 V
Lead Channel Setting Pacing Amplitude: 2 V
Lead Channel Setting Pacing Pulse Width: 0.5 ms
Lead Channel Setting Sensing Sensitivity: 2 mV
Pulse Gen Model: 2272
Pulse Gen Serial Number: 9073848

## 2024-07-10 NOTE — Telephone Encounter (Signed)
 Received paperwork from [facility] at [facility]. Orders placed in folder

## 2024-07-11 NOTE — Progress Notes (Signed)
 Remote PPM Transmission

## 2024-07-14 ENCOUNTER — Ambulatory Visit: Payer: Self-pay | Admitting: Cardiology

## 2024-07-14 DIAGNOSIS — Z23 Encounter for immunization: Secondary | ICD-10-CM | POA: Diagnosis not present

## 2024-07-14 NOTE — Progress Notes (Signed)
 Remote PPM Transmission

## 2024-07-14 NOTE — Telephone Encounter (Signed)
 faxed

## 2024-07-16 DIAGNOSIS — R2689 Other abnormalities of gait and mobility: Secondary | ICD-10-CM | POA: Diagnosis not present

## 2024-07-16 DIAGNOSIS — M158 Other polyosteoarthritis: Secondary | ICD-10-CM | POA: Diagnosis not present

## 2024-07-17 DIAGNOSIS — M158 Other polyosteoarthritis: Secondary | ICD-10-CM | POA: Diagnosis not present

## 2024-07-17 DIAGNOSIS — R2689 Other abnormalities of gait and mobility: Secondary | ICD-10-CM | POA: Diagnosis not present

## 2024-07-21 ENCOUNTER — Ambulatory Visit (INDEPENDENT_AMBULATORY_CARE_PROVIDER_SITE_OTHER): Admitting: Nurse Practitioner

## 2024-07-21 ENCOUNTER — Encounter: Payer: Self-pay | Admitting: Nurse Practitioner

## 2024-07-21 VITALS — BP 142/76 | HR 64 | Temp 97.7°F | Ht 64.0 in | Wt 181.8 lb

## 2024-07-21 DIAGNOSIS — G4733 Obstructive sleep apnea (adult) (pediatric): Secondary | ICD-10-CM

## 2024-07-21 DIAGNOSIS — K219 Gastro-esophageal reflux disease without esophagitis: Secondary | ICD-10-CM

## 2024-07-21 DIAGNOSIS — J479 Bronchiectasis, uncomplicated: Secondary | ICD-10-CM

## 2024-07-21 NOTE — Assessment & Plan Note (Addendum)
 Post COVID with DOE. Stable on prior imaging. No symptoms of chronic productive cough. Continue bronchodilator regimen. Action plan in place. Graded exercises encouraged

## 2024-07-21 NOTE — Assessment & Plan Note (Signed)
 Continue current PPI therapy. Follow up with ENT as scheduled

## 2024-07-21 NOTE — Progress Notes (Signed)
 @Patient  ID: Alison Huffman, female    DOB: Aug 10, 1941, 83 y.o.   MRN: [id number]  Chief Complaint  Patient presents with   Obstructive Sleep Apnea    Using CPAP every night. No problems with mask or pressure.     Referring provider: Gretel App, NP  HPI: 83 year old female, never smoker followed for OSA and bronchiectasis. She is a patient of Dr. Nelda and last seen in office 04/15/2024. Past medical history significant for guillain barre, CAD, diastolic CHF, GERD, HLD, lichen sclerosus, psoriatic arthritis, second degree heart block s/p pacemaker, TMJ, rosacea, right vocal cord paralysis followed intubation.  TEST/EVENTS: 01/24/2018 PSG: AHI 24.1, SpO2 low 84% >> CPAP 8 cmH2O 06/05/2020 CT chest: mild cylindrical BTX RLL, mild GGO rt lung base  12/16/2020 CT chest: stable nodules, BTX 02/22/2022 PFT: FVC 87, FEV1 101, ratio 91, TLC 73, DLCOcor 74.  No BD 06/19/2022 HRCT chest: cylindrical BTX, calcified granulomas, scatted nodules up to 5 mm, GGO resolved, air trapping 12/26/2022 PFT: FVC 73, FEV1 93, ratio 85, TLC 85, DLCO 71.  13% change in FVC postbronchodilator  04/15/2024: OV with Dr. Malka. Doing well overall. Breathing improved on Symbicort . Recovering from TKR and rehabing daily. Compliant with CPAP.   07/21/2024: Today - follow up Discussed the use of AI scribe software for clinical note transcription with the patient, who gave verbal consent to proceed.  History of Present Illness Alison Huffman is an 83 year old female who presents for follow up.  She overall is doing well. She does still have shortness of breath with more strenuous exertion, which is baseline for her. She uses her Symbicort  daily, occasionally missing a day. Feels like she benefits from use. She has not needed her albuterol  inhaler recently. Her shortness of breath is compounded by reduced mobility following knee replacement surgery in June, which affects her stamina and requires her to walk slowly.  No significant cough, wheezing, chest congestion.   She experienced irritation in her right vocal cord following anesthesia for her knee surgery, which was evaluated by an ENT specialist. A scope revealed inflammation, and she was started on reflux medication, which has been beneficial. She continues regular follow-up with the ENT specialist. No current reflux symptoms. Has a slight hoarseness to her voice, which is chronic. No difficulties swallowing or sore throat. No abd pain, N/V. No impact on appetite. No weight loss.   She uses a CPAP machine for her sleep apnea. No issues with drowsy driving and she reports good energy levels during the day. Sleeps well with it.   She recently developed atrial fibrillation and has an upcoming appointment for evaluation. She denies any syncope, lightheadedness, CP.   06/18/2024-07/17/2024: CPAP 5-15 cmH2O 30/30 days; 100% >4 hr; average use 8 hr 5 min Pressure 95th 12 Leaks 95th 22.4 AHI 3.8    Allergies  Allergen Reactions   Enbrel [Etanercept] Other (See Comments)    Guillain Baree   Codeine Other (See Comments)    Stomach ache   Nickel     Immunization History  Administered Date(s) Administered    sv, Bivalent, Protein Subunit Rsvpref,pf Marlow) 08/09/2023, 08/16/2023   Fluad Quad(high Dose 65+) 06/18/2019, 10/06/2020, 07/12/2022   Hepatitis B 01/01/1990, 01/31/1990, 07/03/1990   INFLUENZA, HIGH DOSE SEASONAL PF 07/13/2021, 07/12/2022, 07/04/2023   Influenza Whole 07/07/1999   Influenza-Unspecified 06/18/2017, 07/10/2018, 07/17/2023   PFIZER Comirnaty(Gray Top)Covid-19 Tri-Sucrose Vaccine 02/16/2021   PFIZER(Purple Top)SARS-COV-2 Vaccination 10/09/2019, 11/05/2019, 09/09/2020, 02/16/2021   Pneumococcal Conjugate-13 05/06/2014  Pneumococcal Polysaccharide-23 05/10/2005   Tdap 09/04/2017   Unspecified SARS-COV-2 Vaccination 10/09/2019, 11/05/2019   Zoster Recombinant(Shingrix) 08/26/2019, 12/15/2019   Zoster, Live 07/28/2008,  08/11/2008    Past Medical History:  Diagnosis Date   (HFpEF) heart failure with preserved ejection fraction (HCC)    Aortic atherosclerosis    Atrial tachycardia    Atypical chest pain    a. 10/2018 MV: EF 57%. No ischemia/infarct.   Cholelithiasis    Coronary artery disease    Dyspnea    GERD (gastroesophageal reflux disease)    Guillain-Barre    Headache    Hyperlipidemia    Hypertension    Inflammatory neuropathy    Joint pain in fingers of right hand    Lichen sclerosus    Osteopenia    Pneumonia 2012? 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   Psoriasis    Psoriatic arthritis (HCC)    RBBB (right bundle branch block)    Rosacea    Seborrheic keratosis    Second degree heart block    a.) s/p SJM PPM placement 07/23/2018   Sleep apnea    a.) does not utilize prescribed nocturnal PAP therapy   TMJ (dislocation of temporomandibular joint)    Unilateral primary osteoarthritis, left knee     Tobacco History: Social History   Tobacco Use  Smoking Status Never  Smokeless Tobacco Never  Tobacco Comments   07/22/2018 smoked some in college; 1960s;  nothing since   Counseling given: Not Answered Tobacco comments: 07/22/2018 smoked some in college; 1960s;  nothing since   Outpatient Medications Prior to Visit  Medication Sig Dispense Refill   acetaminophen  (TYLENOL ) 500 MG tablet Take 2 tablets (1,000 mg total) by mouth every 8 (eight) hours. 30 tablet 0   albuterol  (VENTOLIN  HFA) 108 (90 Base) MCG/ACT inhaler Inhale 2 puffs into the lungs every 6 (six) hours as needed for wheezing or shortness of breath. 8 g 2   budesonide -formoterol  (SYMBICORT ) 160-4.5 MCG/ACT inhaler Inhale 2 puffs into the lungs in the morning and at bedtime. 1 each 12   carboxymethylcellulose (REFRESH PLUS) 0.5 % SOLN Place 1-2 drops into both eyes in the morning, at noon, and at bedtime.     clobetasol   ointment (TEMOVATE ) 0.05 % Apply 1 Application topically once a week. Lichen sclerosus     furosemide  (LASIX ) 20 MG tablet TAKE ONE TABLET BY MOUTH EVERY DAY AS NEEDED FOR EDEMA 30 tablet 3   losartan  (COZAAR ) 25 MG tablet Take 1 tablet (25 mg total) by mouth daily. 90 tablet 3   omeprazole (PRILOSEC) 20 MG capsule Take 20 mg by mouth daily.     rosuvastatin  (CRESTOR ) 20 MG tablet Take 1 tablet (20 mg total) by mouth daily. 90 tablet 3   Spacer/Aero-Holding Chambers (AEROCHAMBER MV) inhaler Use as instructed 1 each 0   tacrolimus  (PROTOPIC ) 0.1 % ointment Apply 1 Application topically 2 (two) times daily as needed (for psoriasis).     Vitamin D , Ergocalciferol , 50 MCG (2000 UT) CAPS Take 2,000 Units by mouth in the morning.     No facility-administered medications prior to visit.     Review of Systems: as above    Physical Exam:  BP (!) 142/76   Pulse 64   Temp 97.7 F (36.5 C) (Temporal)   Ht 5' 4 (1.626 m)   Wt 181 lb 12.8 oz (82.5 kg)   SpO2 97%  BMI 31.21 kg/m   GEN: Pleasant, interactive, well-appearing; in no acute distress HEENT:  Normocephalic and atraumatic. PERRLA. Sclera white. Nasal turbinates pink, moist and patent bilaterally. No rhinorrhea present. Oropharynx pink and moist, without exudate or edema. No lesions, ulcerations, or postnasal drip.  NECK:  Supple w/ fair ROM. Thyroid  symmetrical with no goiter or nodules palpated. No lymphadenopathy.   CV: RRR, no m/r/g, no peripheral edema. Pulses intact, +2 bilaterally. No cyanosis, pallor or clubbing. PULMONARY:  Unlabored, regular breathing. Clear bilaterally A&P w/o wheezes/rales/rhonchi. No accessory muscle use.  GI: BS present and normoactive. Soft, non-tender to palpation.  Neuro: A/Ox3. No focal deficits noted.   Skin: Warm, no lesions or rashe Psych: Normal affect and behavior. Judgement and thought content appropriate.     Lab Results:  CBC    Component Value Date/Time   WBC 4.7 05/28/2024 0910    RBC 4.19 05/28/2024 0910   HGB 12.9 05/28/2024 0910   HCT 39.5 05/28/2024 0910   PLT 216.0 05/28/2024 0910   MCV 94.1 05/28/2024 0910   MCH 31.5 03/18/2024 0615   MCHC 32.6 05/28/2024 0910   RDW 13.1 05/28/2024 0910   LYMPHSABS 1.2 05/28/2024 0910   MONOABS 0.4 05/28/2024 0910   EOSABS 0.2 05/28/2024 0910   BASOSABS 0.0 05/28/2024 0910    BMET    Component Value Date/Time   NA 140 05/28/2024 0910   NA 142 09/12/2022 0000   K 4.4 05/28/2024 0910   CL 105 05/28/2024 0910   CO2 27 05/28/2024 0910   GLUCOSE 90 05/28/2024 0910   BUN 13 05/28/2024 0910   BUN 13 05/23/2021 1210   CREATININE 0.55 05/28/2024 0910   CALCIUM  9.3 05/28/2024 0910   GFRNONAA >60 03/18/2024 0615   GFRAA >60 05/03/2020 0513    BNP No results found for: BNP   Imaging:  CUP PACEART REMOTE DEVICE CHECK Result Date: 07/10/2024 Pacemaker: Scheduled remote reviewed. Normal device function.  Presenting rhythm: AS/VP AMS, AF burden 1%, longest 25 min 46 sec, no OAC, sent to triage Next remote transmission per protocol. ML, CVRS   Administration History     None          Latest Ref Rng & Units 12/26/2022    9:41 AM 02/22/2022    2:36 PM  PFT Results  FVC-Pre L 1.99  2.42   FVC-Predicted Pre % 73  87   FVC-Post L 2.25  2.36   FVC-Predicted Post % 82  85   Pre FEV1/FVC % % 95  87   Post FEV1/FCV % % 85  91   FEV1-Pre L 1.90  2.10   FEV1-Predicted Pre % 93  101   FEV1-Post L 1.92  2.14   DLCO uncorrected ml/min/mmHg 14.23  13.36   DLCO UNC% % 71  66   DLVA Predicted % 87  89   TLC L 4.56  3.93   TLC % Predicted % 85  73   RV % Predicted % 81  60     No results found for: NITRICOXIDE      Assessment & Plan:   OSA (obstructive sleep apnea) Moderate OSA on CPAP. Excellent compliance and control. Receives benefit from use. Aware of risks of untreated OSA and proper care/use of PAP device. Encouraged healthy weight management. Safe driving practices reviewed. Encouraged to continue  utilizing nightly.  Patient Instructions  Continue albuterol  inhaler 2 puffs every 6 hours as needed for shortness of breath or wheezing Continue Symbicort  2 puffs Twice daily.  Brush tongue and rinse mouth afterwards Continue omeprazole 1 tab daily   Continue to use CPAP every night, minimum of 4-6 hours a night.  Change equipment every 30 days or as directed by DME. Wash your tubing with warm soap and water  daily, hang to dry. Wash humidifier portion weekly.  Be aware of reduced alertness and do not drive or operate heavy machinery if experiencing this or drowsiness.   Follow up with ENT as scheduled    Follow up in 6 months with Dr. Malka or Izetta Malachy Huffman. If symptoms do not improve or worsen, please contact office for sooner follow up or seek emergency care.    Bronchiectasis without complication (HCC) Post COVID with DOE. Stable on prior imaging. No symptoms of chronic productive cough. Continue bronchodilator regimen. Action plan in place. Graded exercises encouraged  GERD (gastroesophageal reflux disease) Continue current PPI therapy. Follow up with ENT as scheduled    I spent 25 minutes of dedicated to the care of this patient on the date of this encounter to include pre-visit review of records, face-to-face time with the patient discussing conditions above, post visit ordering of testing, clinical documentation with the electronic health record, making appropriate referrals as documented, and communicating necessary findings to members of the patients care team.  Alison LULLA Malachy, NP 07/21/2024  Pt aware and understands NP's role.

## 2024-07-21 NOTE — Assessment & Plan Note (Signed)
 Moderate OSA on CPAP. Excellent compliance and control. Receives benefit from use. Aware of risks of untreated OSA and proper care/use of PAP device. Encouraged healthy weight management. Safe driving practices reviewed. Encouraged to continue utilizing nightly.  Patient Instructions  Continue albuterol  inhaler 2 puffs every 6 hours as needed for shortness of breath or wheezing Continue Symbicort  2 puffs Twice daily. Brush tongue and rinse mouth afterwards Continue omeprazole 1 tab daily   Continue to use CPAP every night, minimum of 4-6 hours a night.  Change equipment every 30 days or as directed by DME. Wash your tubing with warm soap and water  daily, hang to dry. Wash humidifier portion weekly.  Be aware of reduced alertness and do not drive or operate heavy machinery if experiencing this or drowsiness.   Follow up with ENT as scheduled    Follow up in 6 months with Dr. Malka or Izetta Malachy PIETY. If symptoms do not improve or worsen, please contact office for sooner follow up or seek emergency care.

## 2024-07-21 NOTE — Patient Instructions (Addendum)
 Continue albuterol  inhaler 2 puffs every 6 hours as needed for shortness of breath or wheezing Continue Symbicort  2 puffs Twice daily. Brush tongue and rinse mouth afterwards Continue omeprazole 1 tab daily   Continue to use CPAP every night, minimum of 4-6 hours a night.  Change equipment every 30 days or as directed by DME. Wash your tubing with warm soap and water  daily, hang to dry. Wash humidifier portion weekly.  Be aware of reduced alertness and do not drive or operate heavy machinery if experiencing this or drowsiness.   Follow up with ENT as scheduled    Follow up in 6 months with Dr. Malka or Izetta Malachy PIETY. If symptoms do not improve or worsen, please contact office for sooner follow up or seek emergency care.

## 2024-07-22 ENCOUNTER — Other Ambulatory Visit: Payer: Self-pay | Admitting: Nurse Practitioner

## 2024-07-22 DIAGNOSIS — Z780 Asymptomatic menopausal state: Secondary | ICD-10-CM

## 2024-07-22 DIAGNOSIS — Z1231 Encounter for screening mammogram for malignant neoplasm of breast: Secondary | ICD-10-CM

## 2024-07-22 NOTE — Progress Notes (Unsigned)
 Electrophysiology Clinic Note    Date:  07/23/2024  Patient ID:  [name], [name] [birth year], MRN [id number] PCP:  Gretel App, NP  Cardiologist:  Evalene Lunger, MD  Electrophysiologist:  [name], MD  Electrophysiology APP:  Davyd Podgorski, NP    Discussed the use of AI scribe software for clinical note transcription with the patient, who gave verbal consent to proceed.   Patient Profile    Chief Complaint: AFib on PPM  History of Present Illness: Alison Huffman is a 83 y.o. 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    I last saw her 10/2023 where she had rare palpitation episodes. Also having stable SOB started with guillain-barre diagnosis. Rarely taking PRN lasix .   Her 07/2024 remote device interrogation had a ~70min AFib episode so was recommended to be seen in clinic to discuss.   On follow-up today, she notes that the date of her AFib episode was right after being diagnosed with covid-19. She does endorse intermittent palpitation episodes, uses watch to monitor HR that shows pulse as high as 110s.  She denies chest pain, chest pressure. SOB is stable. Continues to take lasix  PRN, has not taken in months.     Arrhythmia/Device History St Jude dual chamber PPM, imp 2019; dx high grade HB    ROS:  Please see the history of present illness. All other systems are reviewed and otherwise negative.    Physical Exam    VS:  BP 128/72 (BP Location: Left Arm, Patient Position: Sitting, Cuff Size: Normal)   Pulse 78   Ht 5' 5 (1.651 m)   Wt 183 lb 9.6 oz (83.3 kg)   SpO2 96%   BMI 30.55 kg/m  BMI: Body mass index is 30.55 kg/m.           Wt Readings from Last 3 Encounters:  07/23/24 183 lb 9.6 oz (83.3 kg)  07/21/24 181 lb 12.8 oz (82.5 kg)  06/27/24 177 lb 12.8 oz (80.6 kg)     GEN- The patient is well  appearing, alert and oriented x 3 today.   Lungs- Clear to ausculation bilaterally, normal work of breathing.  Heart- Regular rate and rhythm, no murmurs, rubs or gallops Extremities- No peripheral edema, warm, dry Skin-  device pocket well-healed, no tethering    Brief check performed without iterative lead testing/measurements AFib episode 06/27/2024 Brief, less than 1 minute AFib episodes Overall AF burden < 1%   Studies Reviewed   Previous EP, cardiology notes.    EKG is not ordered. Personal review of EKG from 06/05/2024 shows:  AS-VP w prolonged AV conduction, rare PVC; rate 67         Coronary CTA, 06/02/2021 1. Coronary calcium  score of 10.9. This was 17th percentile for age and sex matched control.  2. Normal coronary origin with right dominance.  3. Minimal non obstructive mid LAD disease (<25%).  4. CAD-RADS 1. Minimal non-obstructive CAD (0-24%). Consider non-atherosclerotic causes of chest pain. Consider preventive therapy and risk factor modification.   TTE, 04/12/2020  1. Left ventricular ejection fraction, by estimation, is 55 to 60%. The left ventricle has normal function. The left ventricle has no regional wall motion abnormalities. Left ventricular diastolic parameters are consistent with Grade I diastolic dysfunction (impaired relaxation).   2. Right ventricular systolic  function is normal. The right ventricular size is normal.   3. Left atrial size was moderately dilated.   4. The mitral valve is normal in structure. Trivial mitral valve regurgitation. No evidence of mitral stenosis.   5. The aortic valve is tricuspid. Aortic valve regurgitation is not visualized. Mild aortic valve sclerosis is present, with no evidence of aortic valve stenosis.   6. The inferior vena cava is normal in size with greater than 50% respiratory variability, suggesting right atrial pressure of 3 mmHg.     Assessment and Plan     #) Atach #) subclinical AFib Long discussion  with patient regarding stroke risk with AFib and whether to initiate OAC.  She has no h/o CVA or TIA, and recent 25 minute afib episode was iso of Covid-19.  At this time, she prefers to start 81mg  ASA and continue to monitor burden via PPM. If burden increases or episodes increase in duration, recommend transitioning to OAC.  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   The patient does not have concerns regarding her medicines.  The following changes were made today:   START 81mg  ASA  Labs/ tests ordered today include:  No orders of the defined types were placed in this encounter.    Disposition: Follow up with Dr. Cindie or EP APP in 3 months   Signed, Amika Tassin, NP  07/23/24  1:52 PM  Electrophysiology [facility]

## 2024-07-23 ENCOUNTER — Ambulatory Visit: Attending: Cardiology | Admitting: Cardiology

## 2024-07-23 VITALS — BP 128/72 | HR 78 | Ht 65.0 in | Wt 183.6 lb

## 2024-07-23 DIAGNOSIS — I4891 Unspecified atrial fibrillation: Secondary | ICD-10-CM | POA: Insufficient documentation

## 2024-07-23 DIAGNOSIS — R2689 Other abnormalities of gait and mobility: Secondary | ICD-10-CM | POA: Diagnosis not present

## 2024-07-23 DIAGNOSIS — Z950 Presence of cardiac pacemaker: Secondary | ICD-10-CM | POA: Insufficient documentation

## 2024-07-23 DIAGNOSIS — I441 Atrioventricular block, second degree: Secondary | ICD-10-CM | POA: Diagnosis not present

## 2024-07-23 DIAGNOSIS — M158 Other polyosteoarthritis: Secondary | ICD-10-CM | POA: Diagnosis not present

## 2024-07-23 MED ORDER — ASPIRIN 81 MG PO TBEC
81.0000 mg | DELAYED_RELEASE_TABLET | Freq: Every day | ORAL | Status: AC
Start: 2024-07-23 — End: ?

## 2024-07-23 NOTE — Patient Instructions (Signed)
 Medication Instructions:  - START aspirin 81 mg daily   *If you need a refill on your cardiac medications before your next appointment, please call your pharmacy*  Lab Work: No labs ordered today  If you have labs (blood work) drawn today and your tests are completely normal, you will receive your results only by: MyChart Message (if you have MyChart) OR A paper copy in the mail If you have any lab test that is abnormal or we need to change your treatment, we will call you to review the results.  Testing/Procedures: No test ordered today   Follow-Up: At [facility], you and your health needs are our priority.  As part of our continuing mission to provide you with exceptional heart care, our providers are all part of one team.  This team includes your primary Cardiologist (physician) and Advanced Practice Providers or APPs (Physician Assistants and Nurse Practitioners) who all work together to provide you with the care you need, when you need it.  Your next appointment:   3 month(s)  Provider:   Suzann Riddle, NP  We recommend signing up for the patient portal called MyChart.  Sign up information is provided on this After Visit Summary.  MyChart is used to connect with patients for Virtual Visits (Telemedicine).  Patients are able to view lab/test results, encounter notes, upcoming appointments, etc.  Non-urgent messages can be sent to your provider as well.   To learn more about what you can do with MyChart, go to [email or web address].

## 2024-07-24 DIAGNOSIS — M158 Other polyosteoarthritis: Secondary | ICD-10-CM | POA: Diagnosis not present

## 2024-07-24 DIAGNOSIS — R2689 Other abnormalities of gait and mobility: Secondary | ICD-10-CM | POA: Diagnosis not present

## 2024-07-28 DIAGNOSIS — R2689 Other abnormalities of gait and mobility: Secondary | ICD-10-CM | POA: Diagnosis not present

## 2024-07-28 DIAGNOSIS — M158 Other polyosteoarthritis: Secondary | ICD-10-CM | POA: Diagnosis not present

## 2024-07-29 DIAGNOSIS — H2513 Age-related nuclear cataract, bilateral: Secondary | ICD-10-CM | POA: Diagnosis not present

## 2024-07-29 DIAGNOSIS — H47011 Ischemic optic neuropathy, right eye: Secondary | ICD-10-CM | POA: Diagnosis not present

## 2024-07-29 DIAGNOSIS — H0100A Unspecified blepharitis right eye, upper and lower eyelids: Secondary | ICD-10-CM | POA: Diagnosis not present

## 2024-07-30 ENCOUNTER — Other Ambulatory Visit: Payer: Self-pay | Admitting: Nurse Practitioner

## 2024-07-30 DIAGNOSIS — R2689 Other abnormalities of gait and mobility: Secondary | ICD-10-CM | POA: Diagnosis not present

## 2024-07-30 DIAGNOSIS — M158 Other polyosteoarthritis: Secondary | ICD-10-CM | POA: Diagnosis not present

## 2024-07-30 DIAGNOSIS — I10 Essential (primary) hypertension: Secondary | ICD-10-CM

## 2024-08-04 DIAGNOSIS — M158 Other polyosteoarthritis: Secondary | ICD-10-CM | POA: Diagnosis not present

## 2024-08-04 DIAGNOSIS — R2689 Other abnormalities of gait and mobility: Secondary | ICD-10-CM | POA: Diagnosis not present

## 2024-08-07 DIAGNOSIS — M158 Other polyosteoarthritis: Secondary | ICD-10-CM | POA: Diagnosis not present

## 2024-08-07 DIAGNOSIS — R2689 Other abnormalities of gait and mobility: Secondary | ICD-10-CM | POA: Diagnosis not present

## 2024-08-11 DIAGNOSIS — M158 Other polyosteoarthritis: Secondary | ICD-10-CM | POA: Diagnosis not present

## 2024-08-11 DIAGNOSIS — R2689 Other abnormalities of gait and mobility: Secondary | ICD-10-CM | POA: Diagnosis not present

## 2024-08-12 DIAGNOSIS — M158 Other polyosteoarthritis: Secondary | ICD-10-CM | POA: Diagnosis not present

## 2024-08-12 DIAGNOSIS — R2689 Other abnormalities of gait and mobility: Secondary | ICD-10-CM | POA: Diagnosis not present

## 2024-08-19 DIAGNOSIS — R2689 Other abnormalities of gait and mobility: Secondary | ICD-10-CM | POA: Diagnosis not present

## 2024-08-19 DIAGNOSIS — M158 Other polyosteoarthritis: Secondary | ICD-10-CM | POA: Diagnosis not present

## 2024-08-20 DIAGNOSIS — R2689 Other abnormalities of gait and mobility: Secondary | ICD-10-CM | POA: Diagnosis not present

## 2024-08-20 DIAGNOSIS — M158 Other polyosteoarthritis: Secondary | ICD-10-CM | POA: Diagnosis not present

## 2024-08-24 DIAGNOSIS — R2689 Other abnormalities of gait and mobility: Secondary | ICD-10-CM | POA: Diagnosis not present

## 2024-08-24 DIAGNOSIS — M158 Other polyosteoarthritis: Secondary | ICD-10-CM | POA: Diagnosis not present

## 2024-08-25 DIAGNOSIS — M158 Other polyosteoarthritis: Secondary | ICD-10-CM | POA: Diagnosis not present

## 2024-08-25 DIAGNOSIS — R2689 Other abnormalities of gait and mobility: Secondary | ICD-10-CM | POA: Diagnosis not present

## 2024-08-26 ENCOUNTER — Ambulatory Visit
Admission: RE | Admit: 2024-08-26 | Discharge: 2024-08-26 | Disposition: A | Discharge status: Home / Self Care | Source: Ambulatory Visit | Attending: Nurse Practitioner | Admitting: Nurse Practitioner

## 2024-08-26 ENCOUNTER — Ambulatory Visit
Admission: RE | Admit: 2024-08-26 | Discharge: 2024-08-26 | Disposition: A | Source: Ambulatory Visit | Attending: Nurse Practitioner

## 2024-08-26 ENCOUNTER — Telehealth: Payer: Self-pay | Admitting: Nurse Practitioner

## 2024-08-26 DIAGNOSIS — M85851 Other specified disorders of bone density and structure, right thigh: Secondary | ICD-10-CM | POA: Diagnosis not present

## 2024-08-26 DIAGNOSIS — Z780 Asymptomatic menopausal state: Secondary | ICD-10-CM | POA: Diagnosis not present

## 2024-08-26 DIAGNOSIS — Z1231 Encounter for screening mammogram for malignant neoplasm of breast: Secondary | ICD-10-CM | POA: Insufficient documentation

## 2024-08-26 DIAGNOSIS — M85852 Other specified disorders of bone density and structure, left thigh: Secondary | ICD-10-CM | POA: Diagnosis not present

## 2024-08-26 NOTE — Telephone Encounter (Signed)
 Lm and sent MyChart message: Alison Huffman will be out of the office on February 27th.  Please call the office or reschedule your appointment through MyChart.  Thank you.  E2C2 please reschedule appt

## 2024-09-01 DIAGNOSIS — J3801 Paralysis of vocal cords and larynx, unilateral: Secondary | ICD-10-CM | POA: Diagnosis not present

## 2024-09-01 DIAGNOSIS — K219 Gastro-esophageal reflux disease without esophagitis: Secondary | ICD-10-CM | POA: Diagnosis not present

## 2024-09-01 DIAGNOSIS — R490 Dysphonia: Secondary | ICD-10-CM | POA: Diagnosis not present

## 2024-09-02 ENCOUNTER — Ambulatory Visit: Payer: Self-pay | Admitting: Nurse Practitioner

## 2024-09-03 DIAGNOSIS — Z96652 Presence of left artificial knee joint: Secondary | ICD-10-CM | POA: Diagnosis not present

## 2024-10-08 ENCOUNTER — Ambulatory Visit: Attending: Cardiology

## 2024-10-08 DIAGNOSIS — I441 Atrioventricular block, second degree: Secondary | ICD-10-CM

## 2024-10-09 LAB — CUP PACEART REMOTE DEVICE CHECK
Battery Remaining Longevity: 58 mo
Battery Remaining Percentage: 47 %
Battery Voltage: 2.98 V
Brady Statistic AP VP Percent: 5.9 %
Brady Statistic AP VS Percent: 1 %
Brady Statistic AS VP Percent: 91 %
Brady Statistic AS VS Percent: 1.9 %
Brady Statistic RA Percent Paced: 5.5 %
Brady Statistic RV Percent Paced: 97 %
Date Time Interrogation Session: 20260107020012
Implantable Lead Connection Status: 753985
Implantable Lead Connection Status: 753985
Implantable Lead Implant Date: 20191022
Implantable Lead Implant Date: 20191022
Implantable Lead Location: 753859
Implantable Lead Location: 753860
Implantable Pulse Generator Implant Date: 20191022
Lead Channel Impedance Value: 490 Ohm
Lead Channel Impedance Value: 640 Ohm
Lead Channel Pacing Threshold Amplitude: 0.5 V
Lead Channel Pacing Threshold Amplitude: 0.625 V
Lead Channel Pacing Threshold Pulse Width: 0.5 ms
Lead Channel Pacing Threshold Pulse Width: 0.5 ms
Lead Channel Sensing Intrinsic Amplitude: 12 mV
Lead Channel Sensing Intrinsic Amplitude: 4.1 mV
Lead Channel Setting Pacing Amplitude: 0.875
Lead Channel Setting Pacing Amplitude: 2 V
Lead Channel Setting Pacing Pulse Width: 0.5 ms
Lead Channel Setting Sensing Sensitivity: 2 mV
Pulse Gen Model: 2272
Pulse Gen Serial Number: 9073848

## 2024-10-11 ENCOUNTER — Ambulatory Visit: Payer: Self-pay | Admitting: Cardiology

## 2024-10-13 ENCOUNTER — Telehealth: Payer: Self-pay

## 2024-10-13 NOTE — Telephone Encounter (Signed)
 [facility] outpatient PT forms placed in provider to be signed folder

## 2024-10-13 NOTE — Progress Notes (Signed)
 Remote PPM Transmission

## 2024-10-16 NOTE — Telephone Encounter (Signed)
 Faxed

## 2024-10-22 NOTE — Progress Notes (Unsigned)
 "     Electrophysiology Clinic Note    Date:  10/23/2024  Patient ID:  Alison, Huffman 01-25-1941, MRN [id number] PCP:  Gretel App, NP  Cardiologist:  Evalene Lunger, MD  Electrophysiologist:  [name], MD (Inactive)  Electrophysiology APP:  Tyshan Enderle, NP    Discussed the use of AI scribe software for clinical note transcription with the patient, who gave verbal consent to proceed.   Patient Profile    Chief Complaint: AFib on PPM  History of Present Illness: Alison Huffman is a 84 y.o. 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    I last saw her 10/2023 where she had rare palpitation episodes. Also having stable SOB started with guillain-barre diagnosis. Rarely taking PRN lasix .  Her device alerted for AFib in 07/2024 for ~9min AF episode. I saw her in follow-up where we discussed starting eliquis  and patient did not prefer to start.   Today, she is overall feeling well without chest pain, chest pressure or palpitations. She is not aware of any AF episodes.     Arrhythmia/Device History St Jude dual chamber PPM, imp 2019; dx high grade HB    ROS:  Please see the history of present illness. All other systems are reviewed and otherwise negative.    Physical Exam    VS:  BP (!) 146/68 (BP Location: Left Arm, Patient Position: Sitting, Cuff Size: Normal)   Pulse 67   Ht 5' 6 (1.676 m)   Wt 181 lb 12.8 oz (82.5 kg)   SpO2 96%   BMI 29.34 kg/m  BMI: Body mass index is 29.34 kg/m.           Wt Readings from Last 3 Encounters:  10/23/24 181 lb 12.8 oz (82.5 kg)  07/23/24 183 lb 9.6 oz (83.3 kg)  07/21/24 181 lb 12.8 oz (82.5 kg)     GEN- The patient is well appearing, alert and oriented x 3 today.   Lungs- Clear to ausculation bilaterally, normal work of breathing.  Heart- Regular rate and rhythm,  no murmurs, rubs or gallops Extremities- No peripheral edema, warm, dry Skin-  device pocket well-healed, no tethering    Brief check performed without iterative lead testing/measurements ~1hour AF episode 10/03/2024 Brief, less than 1 minute AFib episodes Overall AF burden < 1%   Studies Reviewed   Previous EP, cardiology notes.    EKG is not ordered. Personal review of EKG from 06/05/2024 shows:  AS-VP w prolonged AV conduction, rare PVC; rate 67         Coronary CTA, 06/02/2021 1. Coronary calcium  score of 10.9. This was 17th percentile for age and sex matched control.  2. Normal coronary origin with right dominance.  3. Minimal non obstructive mid LAD disease (<25%).  4. CAD-RADS 1. Minimal non-obstructive CAD (0-24%). Consider non-atherosclerotic causes of chest pain. Consider preventive therapy and risk factor modification.   TTE, 04/12/2020  1. Left ventricular ejection fraction, by estimation, is 55 to 60%. The left ventricle has normal function. The left ventricle has no regional wall motion abnormalities. Left ventricular diastolic parameters are consistent with Grade I diastolic dysfunction (impaired relaxation).   2. Right ventricular systolic function is normal. The right ventricular size is normal.   3. Left atrial size was moderately dilated.   4. The mitral valve  is normal in structure. Trivial mitral valve regurgitation. No evidence of mitral stenosis.   5. The aortic valve is tricuspid. Aortic valve regurgitation is not visualized. Mild aortic valve sclerosis is present, with no evidence of aortic valve stenosis.   6. The inferior vena cava is normal in size with greater than 50% respiratory variability, suggesting right atrial pressure of 3 mmHg.     Assessment and Plan     #) Atach #) parox AFib Overall AF burden low at <1% 1 hour long episode earlier this month, patient asymptomatic Do not recommend AAD or AF ablation at this time  #) Hypercoag d/t parox  afib CHA2DS2-VASc Score = at least 5 [CHF History: 0, HTN History: 1, Diabetes History: 0, Stroke History: 0, Vascular Disease History: 1, Age Score: 2, Gender Score: 1].  Therefore, the patient's annual risk of stroke is 7.2 %.    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   The patient does not have concerns regarding her medicines.  The following changes were made today:   STOP asa START 5mg  eliquis  BID  Labs/ tests ordered today include:  Orders Placed This Encounter  Procedures   CBC   Basic metabolic panel with GFR     Disposition: Follow up with Dr. Kennyth or EP APP in 6 months  Appt with Dr. Gollan in ~6 weeks, would recommend updated CBC at that appointment   Signed, [name], NP  10/23/24  4:35 PM  Electrophysiology [facility] "

## 2024-10-23 ENCOUNTER — Encounter: Payer: Self-pay | Admitting: Cardiology

## 2024-10-23 ENCOUNTER — Ambulatory Visit: Attending: Cardiology | Admitting: Cardiology

## 2024-10-23 VITALS — BP 146/68 | HR 67 | Ht 66.0 in | Wt 181.8 lb

## 2024-10-23 DIAGNOSIS — I4891 Unspecified atrial fibrillation: Secondary | ICD-10-CM

## 2024-10-23 DIAGNOSIS — D6869 Other thrombophilia: Secondary | ICD-10-CM | POA: Diagnosis present

## 2024-10-23 DIAGNOSIS — I480 Paroxysmal atrial fibrillation: Secondary | ICD-10-CM | POA: Insufficient documentation

## 2024-10-23 DIAGNOSIS — Z950 Presence of cardiac pacemaker: Secondary | ICD-10-CM | POA: Insufficient documentation

## 2024-10-23 DIAGNOSIS — I441 Atrioventricular block, second degree: Secondary | ICD-10-CM | POA: Diagnosis present

## 2024-10-23 MED ORDER — APIXABAN 5 MG PO TABS: 5.0000 mg | ORAL_TABLET | Freq: Two times a day (BID) | ORAL | Status: AC

## 2024-10-23 MED ORDER — APIXABAN 5 MG PO TABS: 5.0000 mg | ORAL_TABLET | Freq: Two times a day (BID) | ORAL | 3 refills | Status: AC

## 2024-10-23 NOTE — Patient Instructions (Addendum)
 Medication Instructions:  Your physician recommends the following medication changes.   START TAKING: Eliquis  5 mg twice a day.   *If you need a refill on your cardiac medications before your next appointment, please call your pharmacy*  Lab Work: Your provider would like for you to have following labs drawn today BMP and CBC.    CBC again in 6 weeks with Dr. Gollan.   Testing/Procedures: No test ordered today   Follow-Up: At [facility], you and your health needs are our priority.  As part of our continuing mission to provide you with exceptional heart care, our providers are all part of one team.  This team includes your primary Cardiologist (physician) and Advanced Practice Providers or APPs (Physician Assistants and Nurse Practitioners) who all work together to provide you with the care you need, when you need it.  Your next appointment:   6 month(s)  Provider:   Suzann Riddle, NP

## 2024-10-24 ENCOUNTER — Telehealth: Payer: Self-pay | Admitting: Cardiology

## 2024-10-24 ENCOUNTER — Ambulatory Visit: Payer: Self-pay | Admitting: Cardiology

## 2024-10-24 LAB — BASIC METABOLIC PANEL WITH GFR
BUN/Creatinine Ratio: 17 (ref 12–28)
BUN: 11 mg/dL (ref 8–27)
CO2: 23 mmol/L (ref 20–29)
Calcium: 9.7 mg/dL (ref 8.7–10.3)
Chloride: 104 mmol/L (ref 96–106)
Creatinine, Ser: 0.66 mg/dL (ref 0.57–1.00)
Glucose: 88 mg/dL (ref 70–99)
Potassium: 4.9 mmol/L (ref 3.5–5.2)
Sodium: 142 mmol/L (ref 134–144)
eGFR: 86 mL/min/1.73

## 2024-10-24 LAB — CBC
Hematocrit: 41.7 % (ref 34.0–46.6)
Hemoglobin: 13 g/dL (ref 11.1–15.9)
MCH: 30.8 pg (ref 26.6–33.0)
MCHC: 31.2 g/dL — ABNORMAL LOW (ref 31.5–35.7)
MCV: 99 fL — ABNORMAL HIGH (ref 79–97)
Platelets: 247 x10E3/uL (ref 150–450)
RBC: 4.22 x10E6/uL (ref 3.77–5.28)
RDW: 12.5 % (ref 11.7–15.4)
WBC: 5.7 x10E3/uL (ref 3.4–10.8)

## 2024-10-24 NOTE — Telephone Encounter (Signed)
 Pt c/o medication issue:  1. Name of Medication: apixaban  (ELIQUIS ) 5 MG TABS tablet   2. How are you currently taking this medication (dosage and times per day)? As written   3. Are you having a reaction (difficulty breathing--STAT)? No   4. What is your medication issue? [facility] stating [facility] something here about this medication. She states she thinks they need to clarify the dosage. Please advise.  She also wanted to mentioned that the medication is $94 for 3 month supply.

## 2024-10-27 ENCOUNTER — Ambulatory Visit: Admitting: Podiatry

## 2024-10-28 ENCOUNTER — Telehealth: Payer: Self-pay

## 2024-10-28 NOTE — Telephone Encounter (Signed)
 Patient calling again to f/u on eliquis  approval. Pt has questions and is requesting c/b. Please advise.

## 2024-10-28 NOTE — Telephone Encounter (Signed)
 I spoke with Pharmacist Donny Brash at [facility]. To fill a first time Eliquis  prescription, the pharmacy needed to verify recent labs, weight and diagnosis code. Prescription will be filled as requested for Alison Huffman. Patient and Suzann Riddle, NP notified.

## 2024-10-28 NOTE — Telephone Encounter (Signed)
 [facility] said RX for eliquis  is processing for shipment  Called patient to advise

## 2024-11-28 ENCOUNTER — Ambulatory Visit: Admitting: Nurse Practitioner

## 2024-12-02 ENCOUNTER — Ambulatory Visit: Admitting: Cardiovascular Disease

## 2024-12-02 ENCOUNTER — Ambulatory Visit: Admitting: Nurse Practitioner

## 2024-12-22 ENCOUNTER — Ambulatory Visit

## 2024-12-26 ENCOUNTER — Ambulatory Visit: Admitting: Podiatry

## 2025-01-07 ENCOUNTER — Ambulatory Visit

## 2025-04-08 ENCOUNTER — Ambulatory Visit

## 2025-05-25 ENCOUNTER — Ambulatory Visit: Admitting: Neurology

## 2025-07-08 ENCOUNTER — Ambulatory Visit

## 2025-10-07 ENCOUNTER — Ambulatory Visit
